# Patient Record
Sex: Male | Born: 1970 | Race: Black or African American | Hispanic: No | Marital: Married | Smoking: Never smoker
Health system: Southern US, Community
[De-identification: ages and names within clinical notes are randomized; demographics above are authoritative.]

## PROBLEM LIST (undated history)

## (undated) DIAGNOSIS — K429 Umbilical hernia without obstruction or gangrene: Secondary | ICD-10-CM

## (undated) DIAGNOSIS — T4145XA Adverse effect of unspecified anesthetic, initial encounter: Secondary | ICD-10-CM

## (undated) DIAGNOSIS — E782 Mixed hyperlipidemia: Secondary | ICD-10-CM

## (undated) DIAGNOSIS — N200 Calculus of kidney: Secondary | ICD-10-CM

## (undated) DIAGNOSIS — N400 Enlarged prostate without lower urinary tract symptoms: Secondary | ICD-10-CM

## (undated) DIAGNOSIS — K219 Gastro-esophageal reflux disease without esophagitis: Secondary | ICD-10-CM

## (undated) DIAGNOSIS — I2510 Atherosclerotic heart disease of native coronary artery without angina pectoris: Secondary | ICD-10-CM

## (undated) DIAGNOSIS — N529 Male erectile dysfunction, unspecified: Secondary | ICD-10-CM

## (undated) DIAGNOSIS — E109 Type 1 diabetes mellitus without complications: Secondary | ICD-10-CM

## (undated) DIAGNOSIS — Z9289 Personal history of other medical treatment: Secondary | ICD-10-CM

## (undated) DIAGNOSIS — I10 Essential (primary) hypertension: Secondary | ICD-10-CM

## (undated) DIAGNOSIS — Z973 Presence of spectacles and contact lenses: Secondary | ICD-10-CM

## (undated) DIAGNOSIS — M1990 Unspecified osteoarthritis, unspecified site: Secondary | ICD-10-CM

## (undated) DIAGNOSIS — K4090 Unilateral inguinal hernia, without obstruction or gangrene, not specified as recurrent: Secondary | ICD-10-CM

## (undated) DIAGNOSIS — E785 Hyperlipidemia, unspecified: Secondary | ICD-10-CM

## (undated) DIAGNOSIS — T8859XA Other complications of anesthesia, initial encounter: Secondary | ICD-10-CM

## (undated) DIAGNOSIS — I779 Disorder of arteries and arterioles, unspecified: Secondary | ICD-10-CM

## (undated) DIAGNOSIS — E119 Type 2 diabetes mellitus without complications: Secondary | ICD-10-CM

## (undated) DIAGNOSIS — H539 Unspecified visual disturbance: Secondary | ICD-10-CM

## (undated) DIAGNOSIS — Z87442 Personal history of urinary calculi: Secondary | ICD-10-CM

## (undated) DIAGNOSIS — J189 Pneumonia, unspecified organism: Secondary | ICD-10-CM

## (undated) HISTORY — DX: Personal history of other medical treatment: Z92.89

## (undated) HISTORY — PX: HERNIA REPAIR: SHX51

## (undated) HISTORY — DX: Atherosclerotic heart disease of native coronary artery without angina pectoris: I25.10

## (undated) HISTORY — DX: Unspecified visual disturbance: H53.9

## (undated) HISTORY — PX: KNEE SURGERY: SHX244

## (undated) HISTORY — DX: Disorder of arteries and arterioles, unspecified: I77.9

## (undated) HISTORY — DX: Type 1 diabetes mellitus without complications: E10.9

## (undated) NOTE — *Deleted (*Deleted)
Shared service with APP.  I have personally seen and examined the patient, providing direct face to face care.  Physical exam findings and plan include CT and MRI for signs of Occult stroke. Discussed with neurology for left arm numbness. Possible migraine related/ anxiety. MRI pending. No focal deficits.   No diagnosis found.

---
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
## 1989-06-08 HISTORY — PX: KNEE ARTHROSCOPY: SUR90

## 1998-09-16 ENCOUNTER — Encounter: Payer: Self-pay | Admitting: Emergency Medicine

## 1998-09-16 ENCOUNTER — Emergency Department (HOSPITAL_COMMUNITY): Admission: EM | Admit: 1998-09-16 | Discharge: 1998-09-16 | Payer: Self-pay | Admitting: Emergency Medicine

## 2000-02-08 ENCOUNTER — Emergency Department (HOSPITAL_COMMUNITY): Admission: EM | Admit: 2000-02-08 | Discharge: 2000-02-08 | Payer: Self-pay

## 2001-01-16 ENCOUNTER — Ambulatory Visit (HOSPITAL_COMMUNITY): Admission: RE | Admit: 2001-01-16 | Discharge: 2001-01-16 | Payer: Self-pay | Admitting: Orthopedic Surgery

## 2001-01-16 ENCOUNTER — Encounter: Payer: Self-pay | Admitting: Orthopedic Surgery

## 2004-11-07 ENCOUNTER — Inpatient Hospital Stay (HOSPITAL_COMMUNITY): Admission: EM | Admit: 2004-11-07 | Discharge: 2004-11-08 | Payer: Self-pay | Admitting: Family Medicine

## 2005-03-15 ENCOUNTER — Emergency Department (HOSPITAL_COMMUNITY): Admission: EM | Admit: 2005-03-15 | Discharge: 2005-03-15 | Payer: Self-pay | Admitting: Emergency Medicine

## 2005-05-17 ENCOUNTER — Ambulatory Visit (HOSPITAL_COMMUNITY): Admission: RE | Admit: 2005-05-17 | Discharge: 2005-05-17 | Payer: Self-pay | Admitting: Emergency Medicine

## 2007-10-13 ENCOUNTER — Emergency Department (HOSPITAL_COMMUNITY): Admission: EM | Admit: 2007-10-13 | Discharge: 2007-10-14 | Payer: Self-pay | Admitting: Emergency Medicine

## 2008-04-11 ENCOUNTER — Emergency Department (HOSPITAL_COMMUNITY): Admission: EM | Admit: 2008-04-11 | Discharge: 2008-04-12 | Payer: Self-pay | Admitting: Emergency Medicine

## 2008-07-28 ENCOUNTER — Emergency Department (HOSPITAL_COMMUNITY): Admission: EM | Admit: 2008-07-28 | Discharge: 2008-07-28 | Payer: Self-pay | Admitting: Emergency Medicine

## 2008-07-30 ENCOUNTER — Emergency Department (HOSPITAL_COMMUNITY): Admission: EM | Admit: 2008-07-30 | Discharge: 2008-07-30 | Payer: Self-pay | Admitting: Emergency Medicine

## 2009-05-14 ENCOUNTER — Emergency Department (HOSPITAL_COMMUNITY): Admission: EM | Admit: 2009-05-14 | Discharge: 2009-05-14 | Payer: Self-pay | Admitting: Emergency Medicine

## 2009-05-28 ENCOUNTER — Emergency Department (HOSPITAL_COMMUNITY): Admission: EM | Admit: 2009-05-28 | Discharge: 2009-05-28 | Payer: Self-pay | Admitting: Emergency Medicine

## 2009-06-27 ENCOUNTER — Observation Stay (HOSPITAL_COMMUNITY): Admission: EM | Admit: 2009-06-27 | Discharge: 2009-06-28 | Payer: Self-pay | Admitting: Emergency Medicine

## 2009-07-23 ENCOUNTER — Emergency Department (HOSPITAL_COMMUNITY): Admission: EM | Admit: 2009-07-23 | Discharge: 2009-07-23 | Payer: Self-pay | Admitting: Emergency Medicine

## 2009-08-16 ENCOUNTER — Emergency Department (HOSPITAL_COMMUNITY): Admission: EM | Admit: 2009-08-16 | Discharge: 2009-08-16 | Payer: Self-pay | Admitting: Emergency Medicine

## 2010-02-24 ENCOUNTER — Encounter (INDEPENDENT_AMBULATORY_CARE_PROVIDER_SITE_OTHER): Payer: Self-pay | Admitting: Internal Medicine

## 2010-03-04 ENCOUNTER — Emergency Department (HOSPITAL_COMMUNITY): Admission: EM | Admit: 2010-03-04 | Discharge: 2010-03-04 | Payer: Self-pay | Admitting: Emergency Medicine

## 2010-05-15 ENCOUNTER — Inpatient Hospital Stay (HOSPITAL_COMMUNITY): Admission: EM | Admit: 2010-05-15 | Discharge: 2010-02-24 | Payer: Self-pay | Admitting: Emergency Medicine

## 2010-06-16 ENCOUNTER — Encounter
Admission: RE | Admit: 2010-06-16 | Discharge: 2010-06-16 | Payer: Self-pay | Source: Home / Self Care | Attending: Family Medicine | Admitting: Family Medicine

## 2010-06-26 ENCOUNTER — Emergency Department (HOSPITAL_COMMUNITY)
Admission: EM | Admit: 2010-06-26 | Discharge: 2010-06-26 | Payer: Self-pay | Source: Home / Self Care | Admitting: Emergency Medicine

## 2010-06-30 LAB — URINALYSIS, ROUTINE W REFLEX MICROSCOPIC
Bilirubin Urine: NEGATIVE
Hgb urine dipstick: NEGATIVE
Ketones, ur: NEGATIVE mg/dL
Nitrite: NEGATIVE
Protein, ur: NEGATIVE mg/dL
Specific Gravity, Urine: 1.018 (ref 1.005–1.030)
Urine Glucose, Fasting: NEGATIVE mg/dL
Urobilinogen, UA: 0.2 mg/dL (ref 0.0–1.0)
pH: 8 (ref 5.0–8.0)

## 2010-08-13 ENCOUNTER — Emergency Department (HOSPITAL_COMMUNITY)
Admission: EM | Admit: 2010-08-13 | Discharge: 2010-08-14 | Disposition: A | Discharge status: Home / Self Care | Payer: BC Managed Care – PPO | Attending: Emergency Medicine | Admitting: Emergency Medicine

## 2010-08-13 ENCOUNTER — Emergency Department (HOSPITAL_COMMUNITY): Payer: BC Managed Care – PPO

## 2010-08-13 DIAGNOSIS — R071 Chest pain on breathing: Secondary | ICD-10-CM | POA: Insufficient documentation

## 2010-08-13 LAB — DIFFERENTIAL
Basophils Absolute: 0 10*3/uL (ref 0.0–0.1)
Basophils Relative: 1 % (ref 0–1)
Eosinophils Absolute: 0.3 10*3/uL (ref 0.0–0.7)
Eosinophils Relative: 4 % (ref 0–5)
Lymphocytes Relative: 39 % (ref 12–46)
Lymphs Abs: 3 10*3/uL (ref 0.7–4.0)
Monocytes Absolute: 0.6 10*3/uL (ref 0.1–1.0)
Monocytes Relative: 8 % (ref 3–12)
Neutro Abs: 3.8 10*3/uL (ref 1.7–7.7)
Neutrophils Relative %: 49 % (ref 43–77)

## 2010-08-13 LAB — POCT CARDIAC MARKERS
CKMB, poc: <1 ng/mL — ABNORMAL LOW (ref 1.0–8.0)
Myoglobin, poc: 58.8 ng/mL (ref 12–200)
Troponin i, poc: <0.05 ng/mL (ref 0.00–0.09)

## 2010-08-13 LAB — CBC
HCT: 39.5 % (ref 39.0–52.0)
Hemoglobin: 13 g/dL (ref 13.0–17.0)
MCH: 27.3 pg (ref 26.0–34.0)
MCHC: 32.9 g/dL (ref 30.0–36.0)
MCV: 82.8 fL (ref 78.0–100.0)
Platelets: 216 10*3/uL (ref 150–400)
RBC: 4.77 MIL/uL (ref 4.22–5.81)
RDW: 12.7 % (ref 11.5–15.5)
WBC: 7.7 10*3/uL (ref 4.0–10.5)

## 2010-08-14 LAB — BASIC METABOLIC PANEL
BUN: 11 mg/dL (ref 6–23)
CO2: 25 mEq/L (ref 19–32)
Calcium: 8.4 mg/dL (ref 8.4–10.5)
Chloride: 106 mEq/L (ref 96–112)
Creatinine, Ser: 0.89 mg/dL (ref 0.4–1.5)
GFR calc Af Amer: >60 mL/min (ref >60)
GFR calc non Af Amer: >60 mL/min (ref >60)
Glucose, Bld: 104 mg/dL — ABNORMAL HIGH (ref 70–99)
Potassium: 3.6 mEq/L (ref 3.5–5.1)
Sodium: 137 mEq/L (ref 135–145)

## 2010-08-21 LAB — BASIC METABOLIC PANEL
BUN: 10 mg/dL (ref 6–23)
BUN: 13 mg/dL (ref 6–23)
CO2: 27 mEq/L (ref 19–32)
CO2: 28 mEq/L (ref 19–32)
Calcium: 8.3 mg/dL — ABNORMAL LOW (ref 8.4–10.5)
Calcium: 8.5 mg/dL (ref 8.4–10.5)
Chloride: 109 mEq/L (ref 96–112)
Chloride: 110 mEq/L (ref 96–112)
Creatinine, Ser: 0.91 mg/dL (ref 0.4–1.5)
Creatinine, Ser: 0.98 mg/dL (ref 0.4–1.5)
GFR calc Af Amer: >60 mL/min (ref >60)
GFR calc Af Amer: >60 mL/min (ref >60)
GFR calc non Af Amer: >60 mL/min (ref >60)
GFR calc non Af Amer: >60 mL/min (ref >60)
Glucose, Bld: 111 mg/dL — ABNORMAL HIGH (ref 70–99)
Glucose, Bld: 85 mg/dL (ref 70–99)
Potassium: 3.5 mEq/L (ref 3.5–5.1)
Potassium: 3.6 mEq/L (ref 3.5–5.1)
Sodium: 140 mEq/L (ref 135–145)
Sodium: 142 mEq/L (ref 135–145)

## 2010-08-21 LAB — CBC
HCT: 37.7 % — ABNORMAL LOW (ref 39.0–52.0)
HCT: 39.1 % (ref 39.0–52.0)
Hemoglobin: 12.5 g/dL — ABNORMAL LOW (ref 13.0–17.0)
Hemoglobin: 13 g/dL (ref 13.0–17.0)
MCH: 28.1 pg (ref 26.0–34.0)
MCH: 28.2 pg (ref 26.0–34.0)
MCHC: 33.2 g/dL (ref 30.0–36.0)
MCHC: 33.2 g/dL (ref 30.0–36.0)
MCV: 84.6 fL (ref 78.0–100.0)
MCV: 84.9 fL (ref 78.0–100.0)
Platelets: 170 10*3/uL (ref 150–400)
Platelets: 171 10*3/uL (ref 150–400)
RBC: 4.43 MIL/uL (ref 4.22–5.81)
RBC: 4.62 MIL/uL (ref 4.22–5.81)
RDW: 13.6 % (ref 11.5–15.5)
RDW: 13.6 % (ref 11.5–15.5)
WBC: 6.1 10*3/uL (ref 4.0–10.5)
WBC: 6.4 10*3/uL (ref 4.0–10.5)

## 2010-08-21 LAB — TROPONIN I: Troponin I: 0.01 ng/mL (ref 0.00–0.06)

## 2010-08-21 LAB — LIPID PANEL
Cholesterol: 169 mg/dL (ref 0–200)
HDL: 54 mg/dL (ref >39)
LDL Cholesterol: 100 mg/dL — ABNORMAL HIGH (ref 0–99)
Total CHOL/HDL Ratio: 3.1 RATIO
Triglycerides: 74 mg/dL (ref <150)
VLDL: 15 mg/dL (ref 0–40)

## 2010-08-21 LAB — DIFFERENTIAL
Basophils Absolute: 0 10*3/uL (ref 0.0–0.1)
Basophils Absolute: 0 10*3/uL (ref 0.0–0.1)
Basophils Relative: 1 % (ref 0–1)
Basophils Relative: 1 % (ref 0–1)
Eosinophils Absolute: 0.2 10*3/uL (ref 0.0–0.7)
Eosinophils Absolute: 0.2 10*3/uL (ref 0.0–0.7)
Eosinophils Relative: 3 % (ref 0–5)
Eosinophils Relative: 3 % (ref 0–5)
Lymphocytes Relative: 38 % (ref 12–46)
Lymphocytes Relative: 41 % (ref 12–46)
Lymphs Abs: 2.4 10*3/uL (ref 0.7–4.0)
Lymphs Abs: 2.5 10*3/uL (ref 0.7–4.0)
Monocytes Absolute: 0.6 10*3/uL (ref 0.1–1.0)
Monocytes Absolute: 0.6 10*3/uL (ref 0.1–1.0)
Monocytes Relative: 10 % (ref 3–12)
Monocytes Relative: 9 % (ref 3–12)
Neutro Abs: 2.8 10*3/uL (ref 1.7–7.7)
Neutro Abs: 3.1 10*3/uL (ref 1.7–7.7)
Neutrophils Relative %: 46 % (ref 43–77)
Neutrophils Relative %: 49 % (ref 43–77)

## 2010-08-21 LAB — URINALYSIS, ROUTINE W REFLEX MICROSCOPIC
Bilirubin Urine: NEGATIVE
Glucose, UA: NEGATIVE mg/dL
Hgb urine dipstick: NEGATIVE
Ketones, ur: NEGATIVE mg/dL
Nitrite: NEGATIVE
Protein, ur: NEGATIVE mg/dL
Specific Gravity, Urine: 1.022 (ref 1.005–1.030)
Urobilinogen, UA: 0.2 mg/dL (ref 0.0–1.0)
pH: 6.5 (ref 5.0–8.0)

## 2010-08-21 LAB — TSH: TSH: 2.188 u[IU]/mL (ref 0.350–4.500)

## 2010-08-21 LAB — COMPREHENSIVE METABOLIC PANEL
ALT: 13 U/L (ref 0–53)
AST: 18 U/L (ref 0–37)
Albumin: 3.7 g/dL (ref 3.5–5.2)
Alkaline Phosphatase: 44 U/L (ref 39–117)
BUN: 14 mg/dL (ref 6–23)
CO2: 29 mEq/L (ref 19–32)
Calcium: 9 mg/dL (ref 8.4–10.5)
Chloride: 109 mEq/L (ref 96–112)
Creatinine, Ser: 0.99 mg/dL (ref 0.4–1.5)
GFR calc Af Amer: >60 mL/min (ref >60)
GFR calc non Af Amer: >60 mL/min (ref >60)
Glucose, Bld: 96 mg/dL (ref 70–99)
Potassium: 3.9 mEq/L (ref 3.5–5.1)
Sodium: 141 mEq/L (ref 135–145)
Total Bilirubin: 0.3 mg/dL (ref 0.3–1.2)
Total Protein: 6.4 g/dL (ref 6.0–8.3)

## 2010-08-21 LAB — CK TOTAL AND CKMB (NOT AT ARMC)
CK, MB: 2.6 ng/mL (ref 0.3–4.0)
Relative Index: 0.6 (ref 0.0–2.5)
Total CK: 405 U/L — ABNORMAL HIGH (ref 7–232)

## 2010-08-21 LAB — RAPID URINE DRUG SCREEN, HOSP PERFORMED
Amphetamines: NOT DETECTED
Barbiturates: NOT DETECTED
Benzodiazepines: NOT DETECTED
Cocaine: NOT DETECTED
Opiates: NOT DETECTED
Tetrahydrocannabinol: NOT DETECTED

## 2010-08-21 LAB — CARDIAC PANEL(CRET KIN+CKTOT+MB+TROPI)
CK, MB: 2.4 ng/mL (ref 0.3–4.0)
CK, MB: 2.4 ng/mL (ref 0.3–4.0)
Relative Index: 0.7 (ref 0.0–2.5)
Relative Index: 0.7 (ref 0.0–2.5)
Total CK: 341 U/L — ABNORMAL HIGH (ref 7–232)
Total CK: 343 U/L — ABNORMAL HIGH (ref 7–232)
Troponin I: <0.01 ng/mL (ref 0.00–0.06)
Troponin I: <0.01 ng/mL (ref 0.00–0.06)

## 2010-08-21 LAB — CORTISOL-AM, BLOOD: Cortisol - AM: 12.4 ug/dL (ref 4.3–22.4)

## 2010-08-24 LAB — URINALYSIS, ROUTINE W REFLEX MICROSCOPIC
Bilirubin Urine: NEGATIVE
Glucose, UA: NEGATIVE mg/dL
Hgb urine dipstick: NEGATIVE
Ketones, ur: NEGATIVE mg/dL
Nitrite: NEGATIVE
Protein, ur: NEGATIVE mg/dL
Specific Gravity, Urine: 1.025 (ref 1.005–1.030)
Urobilinogen, UA: 0.2 mg/dL (ref 0.0–1.0)
pH: 7.5 (ref 5.0–8.0)

## 2010-08-24 LAB — POCT I-STAT, CHEM 8
BUN: 11 mg/dL (ref 6–23)
Calcium, Ion: 1.17 mmol/L (ref 1.12–1.32)
Chloride: 107 meq/L (ref 96–112)
Creatinine, Ser: 0.9 mg/dL (ref 0.4–1.5)
Glucose, Bld: 109 mg/dL — ABNORMAL HIGH (ref 70–99)
HCT: 41 % (ref 39.0–52.0)
Hemoglobin: 13.9 g/dL (ref 13.0–17.0)
Potassium: 4 meq/L (ref 3.5–5.1)
Sodium: 142 meq/L (ref 135–145)
TCO2: 28 mmol/L (ref 0–100)

## 2010-08-31 LAB — URINALYSIS, ROUTINE W REFLEX MICROSCOPIC
Bilirubin Urine: NEGATIVE
Glucose, UA: NEGATIVE mg/dL
Hgb urine dipstick: NEGATIVE
Ketones, ur: NEGATIVE mg/dL
Nitrite: NEGATIVE
Protein, ur: NEGATIVE mg/dL
Specific Gravity, Urine: 1.018 (ref 1.005–1.030)
Urobilinogen, UA: 0.2 mg/dL (ref 0.0–1.0)
pH: 8.5 — ABNORMAL HIGH (ref 5.0–8.0)

## 2010-08-31 LAB — GC/CHLAMYDIA PROBE AMP, GENITAL
Chlamydia, DNA Probe: NEGATIVE
GC Probe Amp, Genital: NEGATIVE

## 2010-09-08 LAB — BASIC METABOLIC PANEL
BUN: 8 mg/dL (ref 6–23)
CO2: 24 mEq/L (ref 19–32)
Calcium: 8.8 mg/dL (ref 8.4–10.5)
Chloride: 106 mEq/L (ref 96–112)
Creatinine, Ser: 0.9 mg/dL (ref 0.4–1.5)
GFR calc Af Amer: >60 mL/min (ref >60)
GFR calc non Af Amer: >60 mL/min (ref >60)
Glucose, Bld: 96 mg/dL (ref 70–99)
Potassium: 4.1 mEq/L (ref 3.5–5.1)
Sodium: 139 mEq/L (ref 135–145)

## 2010-09-08 LAB — DIFFERENTIAL
Basophils Absolute: 0 10*3/uL (ref 0.0–0.1)
Basophils Relative: 0 % (ref 0–1)
Eosinophils Absolute: 0.2 10*3/uL (ref 0.0–0.7)
Eosinophils Relative: 3 % (ref 0–5)
Lymphocytes Relative: 21 % (ref 12–46)
Lymphs Abs: 1.6 10*3/uL (ref 0.7–4.0)
Monocytes Absolute: 0.7 10*3/uL (ref 0.1–1.0)
Monocytes Relative: 9 % (ref 3–12)
Neutro Abs: 5.2 10*3/uL (ref 1.7–7.7)
Neutrophils Relative %: 67 % (ref 43–77)

## 2010-09-08 LAB — CBC
HCT: 40.7 % (ref 39.0–52.0)
Hemoglobin: 13.4 g/dL (ref 13.0–17.0)
MCHC: 33 g/dL (ref 30.0–36.0)
MCV: 85.4 fL (ref 78.0–100.0)
Platelets: 201 10*3/uL (ref 150–400)
RBC: 4.77 MIL/uL (ref 4.22–5.81)
RDW: 13.4 % (ref 11.5–15.5)
WBC: 7.8 10*3/uL (ref 4.0–10.5)

## 2010-09-09 LAB — URINALYSIS, ROUTINE W REFLEX MICROSCOPIC
Bilirubin Urine: NEGATIVE
Glucose, UA: NEGATIVE mg/dL
Hgb urine dipstick: NEGATIVE
Ketones, ur: NEGATIVE mg/dL
Nitrite: NEGATIVE
Protein, ur: NEGATIVE mg/dL
Specific Gravity, Urine: 1.024 (ref 1.005–1.030)
Urobilinogen, UA: 0.2 mg/dL (ref 0.0–1.0)
pH: 6.5 (ref 5.0–8.0)
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
## 2010-11-14 ENCOUNTER — Emergency Department (HOSPITAL_COMMUNITY): Payer: BC Managed Care – PPO

## 2010-11-14 ENCOUNTER — Emergency Department (HOSPITAL_COMMUNITY)
Admission: EM | Admit: 2010-11-14 | Discharge: 2010-11-15 | Disposition: A | Discharge status: Home / Self Care | Payer: BC Managed Care – PPO | Attending: Emergency Medicine | Admitting: Emergency Medicine

## 2010-11-14 DIAGNOSIS — R51 Headache: Secondary | ICD-10-CM | POA: Insufficient documentation

## 2010-11-15 LAB — POCT I-STAT, CHEM 8
BUN: 14 mg/dL (ref 6–23)
Calcium, Ion: 1.2 mmol/L (ref 1.12–1.32)
Chloride: 103 mEq/L (ref 96–112)
Creatinine, Ser: 1.1 mg/dL (ref 0.4–1.5)
Glucose, Bld: 129 mg/dL — ABNORMAL HIGH (ref 70–99)
HCT: 40 % (ref 39.0–52.0)
Hemoglobin: 13.6 g/dL (ref 13.0–17.0)
Potassium: 3.5 mEq/L (ref 3.5–5.1)
Sodium: 142 mEq/L (ref 135–145)
TCO2: 24 mmol/L (ref 0–100)

## 2011-02-05 ENCOUNTER — Emergency Department (HOSPITAL_COMMUNITY): Payer: BC Managed Care – PPO

## 2011-02-05 ENCOUNTER — Emergency Department (HOSPITAL_COMMUNITY)
Admission: EM | Admit: 2011-02-05 | Discharge: 2011-02-06 | Disposition: A | Discharge status: Home / Self Care | Payer: BC Managed Care – PPO | Attending: Emergency Medicine | Admitting: Emergency Medicine

## 2011-02-05 DIAGNOSIS — M5430 Sciatica, unspecified side: Secondary | ICD-10-CM | POA: Insufficient documentation

## 2011-02-05 DIAGNOSIS — M545 Low back pain, unspecified: Secondary | ICD-10-CM | POA: Insufficient documentation

## 2011-02-05 DIAGNOSIS — M51379 Other intervertebral disc degeneration, lumbosacral region without mention of lumbar back pain or lower extremity pain: Secondary | ICD-10-CM | POA: Insufficient documentation

## 2011-02-05 DIAGNOSIS — M5137 Other intervertebral disc degeneration, lumbosacral region: Secondary | ICD-10-CM | POA: Insufficient documentation

## 2011-02-05 DIAGNOSIS — M25559 Pain in unspecified hip: Secondary | ICD-10-CM | POA: Insufficient documentation

## 2011-02-05 LAB — URINALYSIS, ROUTINE W REFLEX MICROSCOPIC
Bilirubin Urine: NEGATIVE
Glucose, UA: NEGATIVE mg/dL
Hgb urine dipstick: NEGATIVE
Ketones, ur: NEGATIVE mg/dL
Leukocytes, UA: NEGATIVE
Nitrite: NEGATIVE
Protein, ur: NEGATIVE mg/dL
Specific Gravity, Urine: 1.02 (ref 1.005–1.030)
Urobilinogen, UA: 0.2 mg/dL (ref 0.0–1.0)
pH: 6.5 (ref 5.0–8.0)

## 2011-03-10 LAB — COMPREHENSIVE METABOLIC PANEL
ALT: 22
AST: 25
Albumin: 3.8
Alkaline Phosphatase: 49
BUN: 15
CO2: 27
Calcium: 9
Chloride: 107
Creatinine, Ser: 1.1
GFR calc Af Amer: >60
GFR calc non Af Amer: >60
Glucose, Bld: 97
Potassium: 3.9
Sodium: 139
Total Bilirubin: 0.6
Total Protein: 6.3

## 2011-03-10 LAB — URINALYSIS, ROUTINE W REFLEX MICROSCOPIC
Bilirubin Urine: NEGATIVE
Glucose, UA: NEGATIVE
Hgb urine dipstick: NEGATIVE
Ketones, ur: NEGATIVE
Nitrite: NEGATIVE
Protein, ur: NEGATIVE
Specific Gravity, Urine: 1.021
Urobilinogen, UA: 0.2
pH: 6

## 2011-03-10 LAB — DIFFERENTIAL
Basophils Absolute: 0
Basophils Relative: 1
Eosinophils Absolute: 0.2
Eosinophils Relative: 2
Lymphocytes Relative: 35
Lymphs Abs: 2.7
Monocytes Absolute: 0.6
Monocytes Relative: 7
Neutro Abs: 4.3
Neutrophils Relative %: 55

## 2011-03-10 LAB — CBC
HCT: 42.1
Hemoglobin: 13.6
MCHC: 32.4
MCV: 84.8
Platelets: 211
RBC: 4.97
RDW: 13.7
WBC: 7.8

## 2011-03-10 LAB — LIPASE, BLOOD: Lipase: 14

## 2011-07-31 ENCOUNTER — Emergency Department (HOSPITAL_COMMUNITY)
Admission: EM | Admit: 2011-07-31 | Discharge: 2011-07-31 | Disposition: A | Discharge status: Home / Self Care | Payer: BC Managed Care – PPO | Attending: Emergency Medicine | Admitting: Emergency Medicine

## 2011-07-31 ENCOUNTER — Other Ambulatory Visit: Payer: Self-pay

## 2011-07-31 ENCOUNTER — Encounter (HOSPITAL_COMMUNITY): Payer: Self-pay

## 2011-07-31 ENCOUNTER — Emergency Department (HOSPITAL_COMMUNITY): Payer: BC Managed Care – PPO

## 2011-07-31 DIAGNOSIS — R109 Unspecified abdominal pain: Secondary | ICD-10-CM | POA: Insufficient documentation

## 2011-07-31 DIAGNOSIS — R0602 Shortness of breath: Secondary | ICD-10-CM | POA: Insufficient documentation

## 2011-07-31 HISTORY — DX: Calculus of kidney: N20.0

## 2011-07-31 HISTORY — DX: Gastro-esophageal reflux disease without esophagitis: K21.9

## 2011-07-31 LAB — BASIC METABOLIC PANEL
BUN: 16 mg/dL (ref 6–23)
CO2: 26 mEq/L (ref 19–32)
Calcium: 9.4 mg/dL (ref 8.4–10.5)
Chloride: 107 mEq/L (ref 96–112)
Creatinine, Ser: 1.08 mg/dL (ref 0.50–1.35)
GFR calc Af Amer: >90 mL/min (ref >90)
GFR calc non Af Amer: 83 mL/min — ABNORMAL LOW (ref >90)
Glucose, Bld: 111 mg/dL — ABNORMAL HIGH (ref 70–99)
Potassium: 4.2 mEq/L (ref 3.5–5.1)
Sodium: 142 mEq/L (ref 135–145)

## 2011-07-31 LAB — CBC
HCT: 41.1 % (ref 39.0–52.0)
Hemoglobin: 13.7 g/dL (ref 13.0–17.0)
MCH: 27.9 pg (ref 26.0–34.0)
MCHC: 33.3 g/dL (ref 30.0–36.0)
MCV: 83.7 fL (ref 78.0–100.0)
Platelets: 198 10*3/uL (ref 150–400)
RBC: 4.91 MIL/uL (ref 4.22–5.81)
RDW: 12.8 % (ref 11.5–15.5)
WBC: 6.5 10*3/uL (ref 4.0–10.5)

## 2011-07-31 LAB — URINALYSIS, ROUTINE W REFLEX MICROSCOPIC
Bilirubin Urine: NEGATIVE
Glucose, UA: NEGATIVE mg/dL
Hgb urine dipstick: NEGATIVE
Ketones, ur: NEGATIVE mg/dL
Leukocytes, UA: NEGATIVE
Nitrite: NEGATIVE
Protein, ur: NEGATIVE mg/dL
Specific Gravity, Urine: 1.025 (ref 1.005–1.030)
Urobilinogen, UA: 1 mg/dL (ref 0.0–1.0)
pH: 7.5 (ref 5.0–8.0)

## 2011-07-31 LAB — TROPONIN I: Troponin I: <0.3 ng/mL (ref <0.30)

## 2011-07-31 MED ORDER — ONDANSETRON HCL 4 MG/2ML IJ SOLN
4.0000 mg | Freq: Once | INTRAMUSCULAR | Status: AC
  Administered 2011-07-31: 4 mg via INTRAVENOUS
  Filled 2011-07-31: qty 2

## 2011-07-31 MED ORDER — HYDROMORPHONE HCL PF 1 MG/ML IJ SOLN
1.0000 mg | Freq: Once | INTRAMUSCULAR | Status: DC
  Filled 2011-07-31: qty 1

## 2011-07-31 MED ORDER — MORPHINE SULFATE 4 MG/ML IJ SOLN
4.0000 mg | Freq: Once | INTRAMUSCULAR | Status: AC
  Administered 2011-07-31: 4 mg via INTRAVENOUS
  Filled 2011-07-31: qty 1

## 2011-07-31 MED ORDER — KETOROLAC TROMETHAMINE 30 MG/ML IJ SOLN
15.0000 mg | Freq: Once | INTRAMUSCULAR | Status: AC
  Administered 2011-07-31: 15 mg via INTRAVENOUS
  Filled 2011-07-31: qty 1

## 2011-07-31 MED ORDER — MORPHINE SULFATE 4 MG/ML IJ SOLN
6.0000 mg | Freq: Once | INTRAMUSCULAR | Status: AC
  Administered 2011-07-31: 6 mg via INTRAVENOUS
  Filled 2011-07-31: qty 2

## 2011-07-31 MED ORDER — NAPROXEN 500 MG PO TABS: 500.0000 mg | ORAL_TABLET | Freq: Two times a day (BID) | ORAL | Status: DC

## 2011-07-31 MED ORDER — SODIUM CHLORIDE 0.9 % IV BOLUS (SEPSIS)
1000.0000 mL | Freq: Once | INTRAVENOUS | Status: AC
  Administered 2011-07-31: 1000 mL via INTRAVENOUS

## 2011-07-31 MED ORDER — OXYCODONE-ACETAMINOPHEN 5-325 MG PO TABS: 1.0000 | ORAL_TABLET | ORAL | Status: AC | PRN

## 2011-07-31 NOTE — ED Notes (Signed)
Patient being transported to xray and then CT before returning to unit.

## 2011-07-31 NOTE — ED Notes (Signed)
Called patient's wife at patient's request 

## 2011-07-31 NOTE — ED Notes (Signed)
Patient remains on monitor and oxygen with sats 98% RA. Patient remains in pain and family at the bedside.

## 2011-07-31 NOTE — Discharge Instructions (Signed)
Abdominal Pain Abdominal pain can be caused by many things. Your caregiver decides the seriousness of your pain by an examination and possibly blood tests and X-rays. Many cases can be observed and treated at home. Most abdominal pain is not caused by a disease and will probably improve without treatment. However, in many cases, more time must pass before a clear cause of the pain can be found. Before that point, it may not be known if you need more testing, or if hospitalization or surgery is needed. HOME CARE INSTRUCTIONS   Do not take laxatives unless directed by your caregiver.   Take pain medicine only as directed by your caregiver.   Only take over-the-counter or prescription medicines for pain, discomfort, or fever as directed by your caregiver.   Try a clear liquid diet (broth, tea, or water) for as long as directed by your caregiver. Slowly move to a bland diet as tolerated.  SEEK IMMEDIATE MEDICAL CARE IF:   The pain does not go away.   You have a fever.   You keep throwing up (vomiting).   The pain is felt only in portions of the abdomen. Pain in the right side could possibly be appendicitis. In an adult, pain in the left lower portion of the abdomen could be colitis or diverticulitis.   You pass bloody or black tarry stools.  MAKE SURE YOU:   Understand these instructions.   Will watch your condition.   Will get help right away if you are not doing well or get worse.  Document Released: 03/04/2005 Document Revised: 02/04/2011 Document Reviewed: 01/11/2008 [facility] Patient Information 2012 [place], [place].Flank Pain Flank pain is pain in your side.  HOME CARE  Home care and treatment will depend on the cause of your pain.   Some medicines can help relieve the pain. Take all medicine as told by your doctor.   Tell your doctor about any changes in your pain.   Follow up with your doctor.  GET HELP RIGHT AWAY IF:   Your pain does not get better with medicine.    The pain gets worse.   You have belly (abdominal) pain.   You are short of breath.   You always feel sick to your stomach (nauseous).   You keep throwing up (vomiting).   You have puffiness (swelling) in your belly.   You pass out (faint).   You have a temperature by mouth above 102 F (38.9 C), not controlled by medicine.  MAKE SURE YOU:   Understand these instructions.   Will watch your condition.   Will get help right away if you are not doing well or get worse.  Document Released: 03/03/2008 Document Revised: 02/04/2011 Document Reviewed: 11/09/2009 [facility] Patient Information 2012 [place], [place].  RESOURCE GUIDE  Dental Problems  Patients with Medicaid: [facility] [phone]                                           [phone] [address] [employer] Phone:  [phone]  Phone:  [phone]  If unable to pay or uninsured, contact:  [facility] or [facility]. to become qualified for the adult dental clinic.  Chronic Pain Problems Contact [facility]  [phone] Patients need to be referred by their primary care doctor.  Insufficient Money for Medicine Contact [employer] Way:  call "211" or [facility] [phone].  No Primary Care Doctor Call [facility]  [phone] Other agencies that provide inexpensive medical care    [facility]  [phone]    [facility]  [phone]    [facility]  [phone]    [facility]  [phone]    [facility]  [phone]    [facility]  [phone]  Psychological Services [facility]  [phone] [facility]  [phone] [facility]   [phone] (emergency services [phone])  Substance Abuse Resources [facility]  [phone] [facility] [phone] [facility]  [phone] Floydene Flock [phone] Residential & Outpatient Substance Abuse Program  650-847-4053  Abuse/Neglect [facility] Child Abuse Hotline [phone] [facility] Child Abuse Hotline 336-419-9938 (After Hours)  Emergency Shelter [facility] (469) [phone]  Maternity Homes [facility] [place] [facility] 516-010-6766 [facility] [phone]  MRSA Hotline #:   9494469650    [facility] Resources  [facility]     [employer]                          [facility] Dept. [address] [place]                       [address]      [address] [place] [address]  Blondell Reveal Phone:  [phone]                                   Phone:  [phone]                 Phone:  [phone]  [facility] Phone:  [phone]  [facility] Child Abuse Hotline 914-506-6294 [phone] (After Hours)

## 2011-07-31 NOTE — ED Notes (Signed)
Ekg done and old ekg found in muse and given to Dr. Rubin Payor.

## 2011-07-31 NOTE — ED Notes (Signed)
Patient states his left side and he feels dizzy. Patient states the pain is intermittent and once pain subsides patient states he feels nervious. Patient denies vomiting. Patient states he has nausea with the on set of pain and feels pressure in his chest.

## 2011-07-31 NOTE — ED Provider Notes (Addendum)
History    41 year old male with left flank pain. Acute onset while he is eating lunch this afternoon. Describes a sharp pain in his left side and radiates into lower axillary region. Has been intermittent since then. Last seconds to up to a minute. No appreciable exacerbating or relieving factors. Mild nausea and dizziness. No vomiting. No urinary complaints. No diarrhea. Denies history of similar symptoms. No shortness of breath. No unusual leg pain or swelling. Denies history of abdominal surgery.  CSN: [id number]  Arrival date & time 07/31/11  1325   First MD Initiated Contact with Patient 07/31/11 1344      Chief Complaint  Patient presents with  . Chest Pain    (Consider location/radiation/quality/duration/timing/severity/associated sxs/prior treatment) HPI  Past Medical History  Diagnosis Date  . GERD (gastroesophageal reflux disease)     History reviewed. No pertinent past surgical history.  History reviewed. No pertinent family history.  History  Substance Use Topics  . Smoking status: Never Smoker   . Smokeless tobacco: Not on file  . Alcohol Use: No      Review of Systems   Review of symptoms negative unless otherwise noted in HPI.   Allergies  Review of patient's allergies indicates no known allergies.  Home Medications  No current outpatient prescriptions on file.  BP 125/76  Pulse 85  Temp(Src) 98.2 F (36.8 C) (Oral)  Resp 16  Ht 5\' 9"  (1.753 m)  Wt 170 lb (77.111 kg)  BMI 25.10 kg/m2  SpO2 99%  Physical Exam  Nursing note and vitals reviewed. Constitutional: He appears well-developed and well-nourished. No distress.  HENT:  Head: Normocephalic and atraumatic.  Eyes: Conjunctivae are normal. Pupils are equal, round, and reactive to light. Right eye exhibits no discharge. Left eye exhibits no discharge.  Neck: Neck supple.  Cardiovascular: Normal rate, regular rhythm and normal heart sounds.  Exam reveals no gallop and no friction rub.     No murmur heard. Pulmonary/Chest: Effort normal and breath sounds normal. No respiratory distress.  Abdominal: Soft. He exhibits no distension. There is tenderness.       Mild tenderness to left lower quadrant. There is no guarding or rebound. There is no distention. No surgical scars noted.  Genitourinary:       Normal external male genitalia. Circumcised. Testicles were normal Iie. No scrotal edema. No testicular tenderness. Is no inguinal adenopathy. No concerning external lesions. No penile discharge. Tenderness L inguinal canal but no bulge palpated.  Musculoskeletal: Normal range of motion. He exhibits no edema and no tenderness.  Neurological: He is alert.  Skin: Skin is warm and dry. He is not diaphoretic.  Psychiatric: He has a normal mood and affect. His behavior is normal. Thought content normal.    ED Course  Procedures (including critical care time)  Labs Reviewed  URINALYSIS, ROUTINE W REFLEX MICROSCOPIC - Abnormal; Notable for the following:    APPearance CLOUDY (*)    All other components within normal limits  BASIC METABOLIC PANEL - Abnormal; Notable for the following:    Glucose, Bld 111 (*)    GFR calc non Af Amer 83 (*)    All other components within normal limits  TROPONIN I  CBC   Ct Abdomen Pelvis Wo Contrast  07/31/2011  *RADIOLOGY REPORT*  Clinical Data: Left-sided pain.  Nausea.  CT ABDOMEN AND PELVIS WITHOUT CONTRAST  Technique:  Multidetector CT imaging of the abdomen and pelvis was performed following the standard protocol without intravenous contrast.  Comparison: 06/16/2010.  Findings: 3 mm left lower pole nonobstructing renal calculi.  No evidence of hydronephrosis.  Unenhanced imaging without focal liver, splenic, adrenal or pancreatic lesion.  No calcified gallstones.  Questionable tiny calcification in the right coronary artery.  No calcifications seen along the course of the aorta or branch vessels.  Degenerative changes lower lumbar spine without bony  destructive lesion.  No bowel inflammatory process, free fluid or free air noted.  Prominent seminal vesicles once again noted.  Decompressed urinary bladder without gross abnormality.  IMPRESSION: 3 mm left lower pole nonobstructing renal calculus.  Please see above.  Original Report Authenticated By: Fuller Canada, M.D.    EKG:  Rhythm: Normal sinus rhythm Rate: 89 Axis: Normal axis Intervals: Normal ST segments: Normal  4:40 PM Pt complaining of pain and says can't go home. Requesting additional pain medication, specifically demerol. Explained to pt that I rarely order this because of alternatives with better safety profiles. Offered dilaudid but claims allergy. Morphine re-ordered. Wife driving home. Medically cleared at this time.  4:59 PM Pt remains upset because still having pain. Explained that may not be able to make him pain free prior to DC and that although he is still having symptoms, that I feel he is safe to be discharged. Surgical referral was provided. Scripts for pain meds provided.  1. Flank pain       MDM  41 year old male with left flank pain. Workup unclear as to etiology of this. CT abdomen pelvis is done to evaluate for possible ureteral colic and was negative for any acute abnormality. Labs are normal. Consider atypical ACS but very low suspicion. EKG with no concerning changes. Troponin is normal. Pt had reproducible tenderness in area of L inguinal canal when examined for hernia but no mass palpated. Medically cleared at this point. Low suspicion for acute abdominal surgical process. Patient seemed to be stable. Return precautions were discussed. Outpatient followup.        Raeford Razor, MD 07/31/11 1651  Raeford Razor, MD 07/31/11 1700

## 2011-07-31 NOTE — ED Notes (Signed)
EMT taking patient out to care with wheelchair. Patient's wife is driving patient home.

## 2011-07-31 NOTE — ED Notes (Signed)
Lt. Side chest pain began about 1 hour ago with dizziness describe as burning, pain began while eating lunch.  Pt. Is in NAD, skin is w/d

## 2011-07-31 NOTE — ED Notes (Signed)
Dr. Juleen China advised patient is allergic to dilaudid.

## 2011-08-13 ENCOUNTER — Emergency Department (HOSPITAL_COMMUNITY): Payer: No Typology Code available for payment source

## 2011-08-13 ENCOUNTER — Emergency Department (HOSPITAL_COMMUNITY)
Admission: EM | Admit: 2011-08-13 | Discharge: 2011-08-13 | Disposition: A | Discharge status: Home / Self Care | Payer: No Typology Code available for payment source | Attending: Emergency Medicine | Admitting: Emergency Medicine

## 2011-08-13 DIAGNOSIS — M79609 Pain in unspecified limb: Secondary | ICD-10-CM | POA: Insufficient documentation

## 2011-08-13 DIAGNOSIS — K219 Gastro-esophageal reflux disease without esophagitis: Secondary | ICD-10-CM | POA: Insufficient documentation

## 2011-08-13 DIAGNOSIS — S335XXA Sprain of ligaments of lumbar spine, initial encounter: Secondary | ICD-10-CM | POA: Insufficient documentation

## 2011-08-13 DIAGNOSIS — M545 Low back pain, unspecified: Secondary | ICD-10-CM | POA: Insufficient documentation

## 2011-08-13 DIAGNOSIS — S39012A Strain of muscle, fascia and tendon of lower back, initial encounter: Secondary | ICD-10-CM

## 2011-08-13 DIAGNOSIS — M25469 Effusion, unspecified knee: Secondary | ICD-10-CM | POA: Insufficient documentation

## 2011-08-13 DIAGNOSIS — M25569 Pain in unspecified knee: Secondary | ICD-10-CM | POA: Insufficient documentation

## 2011-08-13 DIAGNOSIS — IMO0001 Reserved for inherently not codable concepts without codable children: Secondary | ICD-10-CM | POA: Insufficient documentation

## 2011-08-13 MED ORDER — IBUPROFEN 800 MG PO TABS: 800.0000 mg | ORAL_TABLET | Freq: Three times a day (TID) | ORAL | Status: DC

## 2011-08-13 MED ORDER — IBUPROFEN 800 MG PO TABS
800.0000 mg | ORAL_TABLET | Freq: Once | ORAL | Status: AC
  Administered 2011-08-13: 800 mg via ORAL
  Filled 2011-08-13: qty 1

## 2011-08-13 MED ORDER — HYDROCODONE-ACETAMINOPHEN 5-325 MG PO TABS: 2.0000 | ORAL_TABLET | ORAL | Status: DC | PRN

## 2011-08-13 MED ORDER — OXYCODONE-ACETAMINOPHEN 5-325 MG PO TABS
ORAL_TABLET | ORAL | Status: AC
  Filled 2011-08-13: qty 1

## 2011-08-13 MED ORDER — OXYCODONE-ACETAMINOPHEN 5-325 MG PO TABS
2.0000 | ORAL_TABLET | Freq: Once | ORAL | Status: AC
  Administered 2011-08-13: 2 via ORAL
  Filled 2011-08-13: qty 1

## 2011-08-13 NOTE — ED Notes (Signed)
YNW:GN56<OZ> Expected date:08/13/11<BR> Expected time: 4:17 PM<BR> Means of arrival:Ambulance<BR> Comments:<BR> M61. 41 yo f. SOB, cough, xray confirmed pneumonia. From dr office. 25 mins
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
## 2011-08-19 ENCOUNTER — Encounter (HOSPITAL_COMMUNITY): Payer: Self-pay | Admitting: *Deleted

## 2011-08-19 ENCOUNTER — Emergency Department (HOSPITAL_COMMUNITY)
Admission: EM | Admit: 2011-08-19 | Discharge: 2011-08-19 | Disposition: A | Discharge status: Home / Self Care | Payer: BC Managed Care – PPO | Attending: Emergency Medicine | Admitting: Emergency Medicine

## 2011-08-19 ENCOUNTER — Emergency Department (HOSPITAL_COMMUNITY): Payer: BC Managed Care – PPO

## 2011-08-19 DIAGNOSIS — J111 Influenza due to unidentified influenza virus with other respiratory manifestations: Secondary | ICD-10-CM | POA: Insufficient documentation

## 2011-08-19 DIAGNOSIS — K219 Gastro-esophageal reflux disease without esophagitis: Secondary | ICD-10-CM | POA: Insufficient documentation

## 2011-08-19 DIAGNOSIS — R070 Pain in throat: Secondary | ICD-10-CM | POA: Insufficient documentation

## 2011-08-19 DIAGNOSIS — R61 Generalized hyperhidrosis: Secondary | ICD-10-CM | POA: Insufficient documentation

## 2011-08-19 DIAGNOSIS — Z87442 Personal history of urinary calculi: Secondary | ICD-10-CM | POA: Insufficient documentation

## 2011-08-19 DIAGNOSIS — R509 Fever, unspecified: Secondary | ICD-10-CM | POA: Insufficient documentation

## 2011-08-19 DIAGNOSIS — R42 Dizziness and giddiness: Secondary | ICD-10-CM | POA: Insufficient documentation

## 2011-08-19 DIAGNOSIS — R079 Chest pain, unspecified: Secondary | ICD-10-CM | POA: Insufficient documentation

## 2011-08-19 MED ORDER — KETOROLAC TROMETHAMINE 30 MG/ML IJ SOLN
30.0000 mg | Freq: Once | INTRAMUSCULAR | Status: AC
  Administered 2011-08-19: 30 mg via INTRAMUSCULAR
  Filled 2011-08-19: qty 1

## 2011-08-19 MED ORDER — OSELTAMIVIR PHOSPHATE 75 MG PO CAPS: 75.0000 mg | ORAL_CAPSULE | Freq: Two times a day (BID) | ORAL | Status: DC

## 2011-08-19 MED ORDER — HYDROCODONE-ACETAMINOPHEN 5-325 MG PO TABS: 1.0000 | ORAL_TABLET | Freq: Four times a day (QID) | ORAL | Status: DC | PRN

## 2011-08-19 MED ORDER — HYDROCODONE-ACETAMINOPHEN 5-325 MG PO TABS
1.0000 | ORAL_TABLET | Freq: Once | ORAL | Status: AC
  Administered 2011-08-19: 1 via ORAL
  Filled 2011-08-19: qty 1

## 2011-08-19 NOTE — Discharge Instructions (Signed)
Influenza Facts Flu (influenza) is a contagious respiratory illness caused by the influenza viruses. It can cause mild to severe illness. While most healthy people recover from the flu without specific treatment and without complications, older people, young children, and people with certain health conditions are at higher risk for serious complications from the flu, including death. CAUSES   The flu virus is spread from person to person by respiratory droplets from coughing and sneezing.   A person can also become infected by touching an object or surface with a virus on it and then touching their mouth, eye or nose.   Adults may be able to infect others from 1 day before symptoms occur and up to 7 days after getting sick. So it is possible to give someone the flu even before you know you are sick and continue to infect others while you are sick.  SYMPTOMS   Fever (usually high).   Headache.   Tiredness (can be extreme).   Cough.   Sore throat.   Runny or stuffy nose.   Body aches.   Diarrhea and vomiting may also occur, particularly in children.   These symptoms are referred to as "flu-like symptoms". A lot of different illnesses, including the common cold, can have similar symptoms.  DIAGNOSIS   There are tests that can determine if you have the flu as long you are tested within the first 2 or 3 days of illness.   A doctor's exam and additional tests may be needed to identify if you have a disease that is a complicating the flu.  RISKS AND COMPLICATIONS  Some of the complications caused by the flu include:  Bacterial pneumonia or progressive pneumonia caused by the flu virus.   Loss of body fluids (dehydration).   Worsening of chronic medical conditions, such as heart failure, asthma, or diabetes.   Sinus problems and ear infections.  HOME CARE INSTRUCTIONS   Seek medical care early on.   If you are at high risk from complications of the flu, consult your  health-care provider as soon as you develop flu-like symptoms. Those at high risk for complications include:   People 65 years or older.   People with chronic medical conditions, including diabetes.   Pregnant women.   Young children.   Your caregiver may recommend use of an antiviral medication to help treat the flu.   If you get the flu, get plenty of rest, drink a lot of liquids, and avoid using alcohol and tobacco.   You can take over-the-counter medications to relieve the symptoms of the flu if your caregiver approves. (Never give aspirin to children or teenagers who have flu-like symptoms, particularly fever).  PREVENTION  The single best way to prevent the flu is to get a flu vaccine each fall. Other measures that can help protect against the flu are:  Antiviral Medications   A number of antiviral drugs are approved for use in preventing the flu. These are prescription medications, and a doctor should be consulted before they are used.   Habits for Good Health   Cover your nose and mouth with a tissue when you cough or sneeze, throw the tissue away after you use it.   Wash your hands often with soap and water, especially after you cough or sneeze. If you are not near water, use an alcohol-based hand cleaner.   Avoid people who are sick.   If you get the flu, stay home from work or school. Avoid contact with  other people so that you do not make them sick, too.   Try not to touch your eyes, nose, or mouth as germs ore often spread this way.  IN CHILDREN, EMERGENCY WARNING SIGNS THAT NEED URGENT MEDICAL ATTENTION:  Fast breathing or trouble breathing.   Bluish skin color.   Not drinking enough fluids.   Not waking up or not interacting.   Being so irritable that the child does not want to be held.   Flu-like symptoms improve but then return with fever and worse cough.   Fever with a rash.  IN ADULTS, EMERGENCY WARNING SIGNS THAT NEED URGENT MEDICAL  ATTENTION:  Difficulty breathing or shortness of breath.   Pain or pressure in the chest or abdomen.   Sudden dizziness.   Confusion.   Severe or persistent vomiting.  SEEK IMMEDIATE MEDICAL CARE IF:  You or someone you know is experiencing any of the symptoms above. When you arrive at the emergency center,report that you think you have the flu. You may be asked to wear a mask and/or sit in a secluded area to protect others from getting sick. MAKE SURE YOU:   Understand these instructions.   Monitor your condition.   Seek medical care if you are getting worse, or not improving.  Document Released: 05/28/2003 Document Revised: 02/04/2011 Document Reviewed: 02/21/2009 [facility] Patient Information 2012 [place], [place].   RESOURCE GUIDE  Dental Problems  Patients with Medicaid: [facility] [phone] [address]                                           [id number] [employer] Phone:  [phone]                                                  Phone:  [phone]  If unable to pay or uninsured, contact:  [facility] or [facility]. to become qualified for the adult dental clinic.  Chronic Pain Problems Contact [facility]  [phone] Patients need to be referred by their primary care doctor.  Insufficient Money for Medicine Contact [employer]:  call "211" or [facility] [phone].  No Primary Care Doctor Call [facility]  [phone] Other agencies that provide inexpensive medical care    [facility]  [phone]    [facility]  [phone]    [facility]  [phone]    [facility]  [phone]    [facility]  [phone]    [facility]  [phone]  Psychological Services [facility]  [phone] [facility]  [phone] [facility]   [phone] (emergency services [phone])  Substance Abuse  Resources [facility]  [phone] [facility] [phone] [facility] [phone] Floydene Flock [phone] Residential & Outpatient Substance Abuse Program  [phone]  Abuse/Neglect [facility] Child Abuse Hotline 820-205-1372 [facility] Child Abuse Hotline [phone] (After Hours)  Emergency Shelter [facility] [phone]  Maternity Homes Room at [facility] [place] [facility] [place] [phone] [address] [facility] Services [phone]  MRSA  Hotline #:   [phone]    [facility] Resources  [facility]     [employer]                          [facility] Dept. [address] [place]                       [address]      [address]  Blondell Reveal Phone:  [phone]                                   Phone:  [phone]                 Phone:  [phone]  [facility] Phone:  [phone]  [facility] Child Abuse Hotline [phone] [phone] (After Hours)   Hydrocodone tablets What is this medicine? HYDROCODONE (hye droe KOE done) is used to help relieve cough. This medicine may be used for other purposes; ask your health care provider or pharmacist if you have questions. What should I tell my health care provider before I take this medicine? They need to know if you have any of these conditions: -brain tumor -drug abuse or addiction -head injury -heart disease -if you frequently drink alcohol-containing drinks -kidney disease -liver disease -lung disease, asthma, or breathing problems -mental problems -an allergic reaction to hydrocodone, other opioid analgesics, other medicines, lactose, foods, dyes, or preservatives -pregnant or trying to get pregnant -breast-feeding How should I use this medicine? Take this medicine by mouth  with a full glass of water. Follow the directions on the prescription label. Take your medicine at regular intervals. Do not take your medicine more often than directed. Talk to your pediatrician regarding the use of this medicine in children. This medicine is not approved for use in children less than 5 years old. Patients over 75 years old may have a stronger reaction and need a smaller dose. Overdosage: If you think you have taken too much of this medicine contact a poison control center or emergency room at once. NOTE: This medicine is only for you. Do not share this medicine with others. What if I miss a dose? If you miss a dose, take it as soon as you can. If it is almost time for your next dose, take only that dose. Do not take double or extra doses. What may interact with this medicine? -alcohol or medicines that contain alcohol -antihistamines -MAOIs like Carbex, Eldepryl, Marplan, Nardil, and Parnate -medicines for depression, anxiety, or psychotic disturbances -medicines for pain -medicines for sleep -naltrexone -tricyclic antidepressants like amitriptyline, clomipramine, desipramine, doxepin, imipramine, nortriptyline, and protriptyline This list may not describe all possible interactions. Give your health care provider a list of all the medicines, herbs, non-prescription drugs, or dietary supplements you use. Also tell them if you smoke, drink alcohol, or use illegal  drugs. Some items may interact with your medicine. What should I watch for while using this medicine? You may develop tolerance to this medicine if you take it for a long time. Tolerance means that you will get less cough relief with time. Tell your doctor or health care professional if your symptoms do not improve or if they get worse. You may get drowsy or dizzy. Do not drive, use machinery, or do anything that needs mental alertness until you know how this medicine affects you. Do not stand or sit up quickly,  especially if you are an older patient. This reduces the risk of dizzy or fainting spells. Alcohol may interfere with the effect of this medicine. Avoid alcoholic drinks. This medicine will cause constipation. Try to have a bowel movement at least every 2 to 3 days. If you do not have a bowel movement for 3 days, call your doctor or health care professional. What side effects may I notice from receiving this medicine? Side effects that you should report to your doctor or health care professional as soon as possible: -allergic reactions like skin rash, itching or hives, swelling of the face, lips, or tongue -breathing difficulties, wheezing -confusion -light headedness or fainting spells Side effects that usually do not require medical attention (report to your doctor or health care professional if they continue or are bothersome): -dizziness -drowsiness -itching -nausea -vomiting This list may not describe all possible side effects. Call your doctor for medical advice about side effects. You may report side effects to FDA at 1-800-FDA-1088. Where should I keep my medicine? Keep out of the reach of children. This medicine can be abused. Keep your medicine in a safe place to protect it from theft. Do not share this medicine with anyone. Selling or giving away this medicine is dangerous and against the law. Store at room temperature between 15 and 30 degrees C (59 and 86 degrees F). Protect from light. Throw away any unused medicine after the expiration date. NOTE: This sheet is a summary. It may not cover all possible information. If you have questions about this medicine, talk to your doctor, pharmacist, or health care provider.  2012, Elsevier/Gold Standard. (10/07/2007 2:39:42 PM)  Oseltamivir capsules What is this medicine? OSELTAMIVIR (os el TAM i vir) is an antiviral medicine. It is used to prevent and to treat some kinds of influenza or the flu. It will not work for colds or other viral  infections. This medicine may be used for other purposes; ask your health care provider or pharmacist if you have questions. What should I tell my health care provider before I take this medicine? They need to know if you have any of the following conditions: -heart disease -immune system problems -kidney disease -liver disease -lung disease -an unusual or allergic reaction to oseltamivir, other medicines, foods, dyes, or preservatives -pregnant or trying to get pregnant -breast-feeding How should I use this medicine? Take this medicine by mouth with a glass of water. Follow the directions on the prescription label. Start this medicine at the first sign of flu symptoms. You can take it with or without food. If it upsets your stomach, take it with food. Take your medicine at regular intervals. Do not take your medicine more often than directed. Take all of your medicine as directed even if you think you are better. Do not skip doses or stop your medicine early. Talk to your pediatrician regarding the use of this medicine in children. While this drug may be  prescribed for children as young as 6 year old for selected conditions, precautions do apply. Overdosage: If you think you have taken too much of this medicine contact a poison control center or emergency room at once. NOTE: This medicine is only for you. Do not share this medicine with others. What if I miss a dose? If you miss a dose, take it as soon as you remember. If it is almost time for your next dose (within 2 hours), take only that dose. Do not take double or extra doses. What may interact with this medicine? Interactions are not expected. This list may not describe all possible interactions. Give your health care provider a list of all the medicines, herbs, non-prescription drugs, or dietary supplements you use. Also tell them if you smoke, drink alcohol, or use illegal drugs. Some items may interact with your medicine. What should I  watch for while using this medicine? Visit your doctor or health care professional for regular check ups. Tell your doctor if your symptoms do not start to get better or if they get worse. If you have the flu, you may be at an increased risk of developing seizures, confusion, or abnormal behavior. This occurs early in the illness, and more frequently in children and teens. These events are not common, but may result in accidental injury to the patient. Families and caregivers of patients should watch for signs of unusual behavior and contact a doctor or health care professional right away if the patient shows signs of unusual behavior. This medicine is not a substitute for the flu shot. Talk to your doctor each year about an annual flu shot. What side effects may I notice from receiving this medicine? Side effects that you should report to your doctor or health care professional as soon as possible: -allergic reactions like skin rash, itching or hives, swelling of the face, lips, or tongue -anxiety, confusion, unusual behavior -breathing problems -hallucination, loss of contact with reality -redness, blistering, peeling or loosening of the skin, including inside the mouth -seizures Side effects that usually do not require medical attention (report to your doctor or health care professional if they continue or are bothersome): -cough -diarrhea -dizziness -headache -nausea, vomiting -stomach pain This list may not describe all possible side effects. Call your doctor for medical advice about side effects. You may report side effects to FDA at 1-800-FDA-1088. Where should I keep my medicine? Keep out of the reach of children. Store at room temperature between 15 and 30 degrees C (59 and 86 degrees F). Throw away any unused medicine after the expiration date. NOTE: This sheet is a summary. It may not cover all possible information. If you have questions about this medicine, talk to your doctor,  pharmacist, or health care provider.  2012, Elsevier/Gold Standard. (12/20/2007 4:36:17 PM)

## 2011-08-19 NOTE — ED Provider Notes (Signed)
History     CSN: [id number]  Arrival date & time 08/19/11  1603   First MD Initiated Contact with Patient 08/19/11 1918      Chief Complaint  Patient presents with  . Influenza  41yo w/ no sig. PMH with 2 days of cold, chills , sweats, dizzy, pains in chest, sore throat, HA.  Fever today of 103. Temp in ED was normal.  Denies any N/V/D but does work at [employer].  No tobacco or alcohol.   (Consider location/radiation/quality/duration/timing/severity/associated sxs/prior treatment) HPI  Past Medical History  Diagnosis Date  . GERD (gastroesophageal reflux disease)   . Kidney stones     History reviewed. No pertinent past surgical history.  History reviewed. No pertinent family history.  History  Substance Use Topics  . Smoking status: Never Smoker   . Smokeless tobacco: Not on file  . Alcohol Use: No      Review of Systems  All other systems reviewed and are negative.    Allergies  Antihistamines, diphenhydramine-type; Demerol; and Hydromorphone  Home Medications   Current Outpatient Rx  Name Route Sig Dispense Refill  . IBUPROFEN 200 MG PO TABS Oral Take 600 mg by mouth every 4 (four) hours as needed. pain      BP 115/62  Pulse 98  Temp(Src) 100.2 F (37.9 C) (Oral)  Resp 22  SpO2 99%  Physical Exam  Nursing note and vitals reviewed. Constitutional: He is oriented to person, place, and time. He appears well-developed and well-nourished. No distress.  HENT:  Head: Normocephalic and atraumatic.  Mouth/Throat: Oropharynx is clear and moist. No oropharyngeal exudate.  Eyes: Conjunctivae and EOM are normal. Pupils are equal, round, and reactive to light.  Neck: Neck supple.  Cardiovascular: Normal rate and regular rhythm.  Exam reveals no gallop and no friction rub.   No murmur heard. Pulmonary/Chest: Breath sounds normal. No respiratory distress. He has no wheezes. He has no rales. He exhibits no tenderness.       B/l rhonchi.   Abdominal: Soft.  Bowel sounds are normal. He exhibits no distension. There is no tenderness. There is no rebound and no guarding.  Musculoskeletal: Normal range of motion. He exhibits no edema and no tenderness.  Lymphadenopathy:    He has no cervical adenopathy.  Neurological: He is alert and oriented to person, place, and time. No cranial nerve deficit. Coordination normal.  Skin: Skin is warm and dry. No rash noted.  Psychiatric: He has a normal mood and affect.    ED Course  Procedures (including critical care time)  Labs Reviewed - No data to display No results found.   No diagnosis found.    MDM  Pt is seen and examined;  Initial history and physical completed.  Will follow.    Will obtain CXR to r/o pNeumonia;  But likely influenza.       Chest x-ray showed no acute disease. We'll treat him given instructions for influenza. Likely we'll not need Tamiflu.   Shaunda Tipping A. Patrica Duel, MD 08/19/11 2110

## 2011-08-19 NOTE — ED Notes (Signed)
Pt reports 2 day hx of flu like symptoms. Reports fever body aches. Denies n/v/d. Reports poor appetite. Reports taking ibuprofen approx 2 hours ago for 103 fever. Mask placed on pt.

## 2011-08-19 NOTE — ED Notes (Signed)
Given ginger ale 

## 2011-08-22 ENCOUNTER — Encounter (HOSPITAL_COMMUNITY): Payer: Self-pay | Admitting: Nurse Practitioner

## 2011-08-22 ENCOUNTER — Emergency Department (HOSPITAL_COMMUNITY)
Admission: EM | Admit: 2011-08-22 | Discharge: 2011-08-23 | Disposition: A | Discharge status: Home / Self Care | Payer: BC Managed Care – PPO | Attending: Emergency Medicine | Admitting: Emergency Medicine

## 2011-08-22 DIAGNOSIS — J189 Pneumonia, unspecified organism: Secondary | ICD-10-CM | POA: Insufficient documentation

## 2011-08-22 DIAGNOSIS — Z87442 Personal history of urinary calculi: Secondary | ICD-10-CM | POA: Insufficient documentation

## 2011-08-22 LAB — BASIC METABOLIC PANEL
BUN: 7 mg/dL (ref 6–23)
CO2: 24 mEq/L (ref 19–32)
Calcium: 8.2 mg/dL — ABNORMAL LOW (ref 8.4–10.5)
Chloride: 101 mEq/L (ref 96–112)
Creatinine, Ser: 1 mg/dL (ref 0.50–1.35)
GFR calc Af Amer: >90 mL/min (ref >90)
GFR calc non Af Amer: >90 mL/min (ref >90)
Glucose, Bld: 96 mg/dL (ref 70–99)
Potassium: 3.1 mEq/L — ABNORMAL LOW (ref 3.5–5.1)
Sodium: 136 mEq/L (ref 135–145)

## 2011-08-22 LAB — URINALYSIS, ROUTINE W REFLEX MICROSCOPIC
Bilirubin Urine: NEGATIVE
Glucose, UA: NEGATIVE mg/dL
Hgb urine dipstick: NEGATIVE
Ketones, ur: NEGATIVE mg/dL
Leukocytes, UA: NEGATIVE
Nitrite: NEGATIVE
Protein, ur: NEGATIVE mg/dL
Specific Gravity, Urine: 1.004 — ABNORMAL LOW (ref 1.005–1.030)
Urobilinogen, UA: 0.2 mg/dL (ref 0.0–1.0)
pH: 6.5 (ref 5.0–8.0)

## 2011-08-22 LAB — CBC
HCT: 39.5 % (ref 39.0–52.0)
Hemoglobin: 13.2 g/dL (ref 13.0–17.0)
MCH: 27.4 pg (ref 26.0–34.0)
MCHC: 33.4 g/dL (ref 30.0–36.0)
MCV: 82 fL (ref 78.0–100.0)
Platelets: 149 10*3/uL — ABNORMAL LOW (ref 150–400)
RBC: 4.82 MIL/uL (ref 4.22–5.81)
RDW: 12.4 % (ref 11.5–15.5)
WBC: 7.9 10*3/uL (ref 4.0–10.5)

## 2011-08-22 LAB — DIFFERENTIAL
Basophils Absolute: 0 10*3/uL (ref 0.0–0.1)
Basophils Relative: 0 % (ref 0–1)
Eosinophils Absolute: 0 10*3/uL (ref 0.0–0.7)
Eosinophils Relative: 0 % (ref 0–5)
Lymphocytes Relative: 19 % (ref 12–46)
Lymphs Abs: 1.5 10*3/uL (ref 0.7–4.0)
Monocytes Absolute: 0.9 10*3/uL (ref 0.1–1.0)
Monocytes Relative: 12 % (ref 3–12)
Neutro Abs: 5.5 10*3/uL (ref 1.7–7.7)
Neutrophils Relative %: 69 % (ref 43–77)

## 2011-08-22 MED ORDER — IBUPROFEN 800 MG PO TABS
800.0000 mg | ORAL_TABLET | Freq: Once | ORAL | Status: AC
  Administered 2011-08-22: 800 mg via ORAL
  Filled 2011-08-22: qty 1

## 2011-08-22 MED ORDER — ACETAMINOPHEN 325 MG PO TABS
650.0000 mg | ORAL_TABLET | Freq: Once | ORAL | Status: AC
  Administered 2011-08-22: 650 mg via ORAL
  Filled 2011-08-22: qty 2

## 2011-08-22 MED ORDER — SODIUM CHLORIDE 0.9 % IV BOLUS (SEPSIS)
1000.0000 mL | Freq: Once | INTRAVENOUS | Status: AC
  Administered 2011-08-22: 1000 mL via INTRAVENOUS

## 2011-08-22 MED ORDER — AZITHROMYCIN 250 MG PO TABS: ORAL_TABLET | ORAL | Status: AC

## 2011-08-22 MED ORDER — DEXTROSE 5 % IV SOLN
500.0000 mg | INTRAVENOUS | Status: DC
  Administered 2011-08-22: 500 mg via INTRAVENOUS
  Filled 2011-08-22: qty 500

## 2011-08-22 MED ORDER — ONDANSETRON HCL 4 MG/2ML IJ SOLN
4.0000 mg | Freq: Once | INTRAMUSCULAR | Status: AC
  Administered 2011-08-22: 4 mg via INTRAVENOUS
  Filled 2011-08-22: qty 2

## 2011-08-22 NOTE — ED Notes (Signed)
Pt diagnosed with flu here last week, reports he is not getting any better. C/o continued body aches, chills, diarrhea since onset. Unable to tolerate po fluids

## 2011-08-22 NOTE — ED Provider Notes (Signed)
History     CSN: [id number]  Arrival date & time 08/22/11  0865   First MD Initiated Contact with Patient 08/22/11 2116      Chief Complaint  Patient presents with  . Influenza     HPI  History provided by the patient and spouse. Patient is a 41 year old male with history of GERD and kidney stones who presents with complaints of generalized body aches, fever, chills and cough for the past 5 days. Symptoms have also been associated with diarrhea that began yesterday. Reports several episodes of loose watery stools. Has also developed mild abdominal cramping it is very intermittent and brief. Patient reports being evaluated for his cough, fever and chill symptoms 3 days ago the emergency room. At that time he reports having chest x-ray and was told there was no signs for pneumonia infection. Patient was instructed to use symptomatic treatment with over-the-counter cough medications, Tylenol or ibuprofen for fever. He reports taking occasional doses of ibuprofen with some improvement of fever over the past few days. Patient has had increasing fatigue and poor sleep at night. Patient had any recent foreign travel or known sick contacts.   Past Medical History  Diagnosis Date  . GERD (gastroesophageal reflux disease)   . Kidney stones     Past Surgical History  Procedure Date  . Knee surgery     History reviewed. No pertinent family history.  History  Substance Use Topics  . Smoking status: Never Smoker   . Smokeless tobacco: Not on file  . Alcohol Use: No      Review of Systems  Constitutional: Positive for fever, chills, diaphoresis, appetite change and fatigue.  Respiratory: Positive for cough. Negative for shortness of breath.   Cardiovascular: Positive for chest pain. Negative for palpitations.  Gastrointestinal: Positive for diarrhea. Negative for nausea, vomiting, abdominal pain and constipation.  Genitourinary: Negative for dysuria, frequency, hematuria and flank  pain.  Musculoskeletal: Positive for myalgias.  Skin: Negative for rash.  Neurological: Positive for light-headedness.  All other systems reviewed and are negative.    Allergies  Morphine and related; Antihistamines, diphenhydramine-type; Demerol; and Hydromorphone  Home Medications   Current Outpatient Rx  Name Route Sig Dispense Refill  . IBUPROFEN 200 MG PO TABS Oral Take 600 mg by mouth every 4 (four) hours as needed. pain      BP 113/75  Pulse 98  Temp(Src) 98.9 F (37.2 C) (Oral)  Resp 16  Ht 5\' 9"  (1.753 m)  Wt 170 lb (77.111 kg)  BMI 25.10 kg/m2  SpO2 99%  Physical Exam  Nursing note and vitals reviewed. Constitutional: He is oriented to person, place, and time. He appears well-developed and well-nourished. No distress.  HENT:  Head: Normocephalic and atraumatic.  Mouth/Throat: Oropharynx is clear and moist.  Eyes: Conjunctivae and EOM are normal. Pupils are equal, round, and reactive to light.  Neck: Normal range of motion. Neck supple.       No meningeal signs  Cardiovascular: Normal rate and regular rhythm.   Pulmonary/Chest: Effort normal and breath sounds normal. No respiratory distress. He has no wheezes. He has no rales. He exhibits no tenderness.  Abdominal: Soft. He exhibits no distension. There is no rebound, no guarding and no CVA tenderness.       Mild diffuse tenderness  Musculoskeletal: He exhibits no edema and no tenderness.  Lymphadenopathy:    He has no cervical adenopathy.  Neurological: He is alert and oriented to person, place, and time.  Skin: Skin  is warm. No rash noted. He is diaphoretic.  Psychiatric: His behavior is normal.    ED Course  Procedures  Results for orders placed during the hospital encounter of 08/22/11  CBC      Component Value Range   WBC 7.9  4.0 - 10.5 (K/uL)   RBC 4.82  4.22 - 5.81 (MIL/uL)   Hemoglobin 13.2  13.0 - 17.0 (g/dL)   HCT 62.9  52.8 - 41.3 (%)   MCV 82.0  78.0 - 100.0 (fL)   MCH 27.4  26.0 -  34.0 (pg)   MCHC 33.4  30.0 - 36.0 (g/dL)   RDW 24.4  01.0 - 27.2 (%)   Platelets 149 (*) 150 - 400 (K/uL)  DIFFERENTIAL      Component Value Range   Neutrophils Relative 69  43 - 77 (%)   Lymphocytes Relative 19  12 - 46 (%)   Monocytes Relative 12  3 - 12 (%)   Eosinophils Relative 0  0 - 5 (%)   Basophils Relative 0  0 - 1 (%)   Neutro Abs 5.5  1.7 - 7.7 (K/uL)   Lymphs Abs 1.5  0.7 - 4.0 (K/uL)   Monocytes Absolute 0.9  0.1 - 1.0 (K/uL)   Eosinophils Absolute 0.0  0.0 - 0.7 (K/uL)   Basophils Absolute 0.0  0.0 - 0.1 (K/uL)   RBC Morphology POLYCHROMASIA PRESENT    BASIC METABOLIC PANEL      Component Value Range   Sodium 136  135 - 145 (mEq/L)   Potassium 3.1 (*) 3.5 - 5.1 (mEq/L)   Chloride 101  96 - 112 (mEq/L)   CO2 24  19 - 32 (mEq/L)   Glucose, Bld 96  70 - 99 (mg/dL)   BUN 7  6 - 23 (mg/dL)   Creatinine, Ser 5.36  0.50 - 1.35 (mg/dL)   Calcium 8.2 (*) 8.4 - 10.5 (mg/dL)   GFR calc non Af Amer >90  >90 (mL/min)   GFR calc Af Amer >90  >90 (mL/min)  URINALYSIS, ROUTINE W REFLEX MICROSCOPIC      Component Value Range   Color, Urine STRAW (*) YELLOW    APPearance CLEAR  CLEAR    Specific Gravity, Urine 1.004 (*) 1.005 - 1.030    pH 6.5  5.0 - 8.0    Glucose, UA NEGATIVE  NEGATIVE (mg/dL)   Hgb urine dipstick NEGATIVE  NEGATIVE    Bilirubin Urine NEGATIVE  NEGATIVE    Ketones, ur NEGATIVE  NEGATIVE (mg/dL)   Protein, ur NEGATIVE  NEGATIVE (mg/dL)   Urobilinogen, UA 0.2  0.0 - 1.0 (mg/dL)   Nitrite NEGATIVE  NEGATIVE    Leukocytes, UA NEGATIVE  NEGATIVE        1. CAP (community acquired pneumonia)       MDM  Patient seen and evaluated. Patient in no distress. Patient with slight diaphoresis. Patient does not appear severely toxic. He is febrile with temp 102. Tylenol given. Heart rate and blood pressure normal.  Chest x-ray reviewed from 3 days ago. No signs for pneumonia infection. Pt continues with worsening symptoms and fever.  Fever improved with  tylenol.  Pt discussed with attending physician.   Pt with clinical concern for possible PNA infx plan to treat with Rx z-pak.  Pt and family agree with plan.  Pt also feeling better after fluids.  Will d/c home at this time.   Medical screening examination/treatment/procedure(s) were performed by non-physician practitioner and as supervising physician I was  immediately available for consultation/collaboration. Osvaldo Human, M.D.      Angus Seller, PA 08/23/11 1903  Carleene Cooper III, MD 08/25/11 [phone]

## 2011-08-22 NOTE — Discharge Instructions (Signed)
You were seen and evaluated again today for your symptoms of fever, chills, bodyaches and cough. Your lab tests today have not shown any concerning signs for infection. Your providers today are concerned for pneumonia infection. You treated with antibiotics in the emergency room and given a prescription for antibiotics to continue at home. Please take these as prescribed for the full length of time. Return to the emergency room for worsening shortness of breath, chest pain, persistent nausea vomiting.   Pneumonia, Adult Pneumonia is an infection of the lungs.  CAUSES Pneumonia may be caused by bacteria or a virus. Usually, these infections are caused by breathing infectious particles into the lungs (respiratory tract). SYMPTOMS   Cough.   Fever.   Chest pain.   Increased rate of breathing.   Wheezing.   Mucus production.  DIAGNOSIS  If you have the common symptoms of pneumonia, your caregiver will typically confirm the diagnosis with a chest X-ray. The X-ray will show an abnormality in the lung (pulmonary infiltrate) if you have pneumonia. Other tests of your blood, urine, or sputum may be done to find the specific cause of your pneumonia. Your caregiver may also do tests (blood gases or pulse oximetry) to see how well your lungs are working. TREATMENT  Some forms of pneumonia may be spread to other people when you cough or sneeze. You may be asked to wear a mask before and during your exam. Pneumonia that is caused by bacteria is treated with antibiotic medicine. Pneumonia that is caused by the influenza virus may be treated with an antiviral medicine. Most other viral infections must run their course. These infections will not respond to antibiotics.  PREVENTION A pneumococcal shot (vaccine) is available to prevent a common bacterial cause of pneumonia. This is usually suggested for:  People over 11 years old.   Patients on chemotherapy.   People with chronic lung problems, such as  bronchitis or emphysema.   People with immune system problems.  If you are over 65 or have a high risk condition, you may receive the pneumococcal vaccine if you have not received it before. In some countries, a routine influenza vaccine is also recommended. This vaccine can help prevent some cases of pneumonia.You may be offered the influenza vaccine as part of your care. If you smoke, it is time to quit. You may receive instructions on how to stop smoking. Your caregiver can provide medicines and counseling to help you quit. HOME CARE INSTRUCTIONS   Cough suppressants may be used if you are losing too much rest. However, coughing protects you by clearing your lungs. You should avoid using cough suppressants if you can.   Your caregiver may have prescribed medicine if he or she thinks your pneumonia is caused by a bacteria or influenza. Finish your medicine even if you start to feel better.   Your caregiver may also prescribe an expectorant. This loosens the mucus to be coughed up.   Only take over-the-counter or prescription medicines for pain, discomfort, or fever as directed by your caregiver.   Do not smoke. Smoking is a common cause of bronchitis and can contribute to pneumonia. If you are a smoker and continue to smoke, your cough may last several weeks after your pneumonia has cleared.   A cold steam vaporizer or humidifier in your room or home may help loosen mucus.   Coughing is often worse at night. Sleeping in a semi-upright position in a recliner or using a couple pillows under your head  will help with this.   Get rest as you feel it is needed. Your body will usually let you know when you need to rest.  SEEK IMMEDIATE MEDICAL CARE IF:   Your illness becomes worse. This is especially true if you are elderly or weakened from any other disease.   You cannot control your cough with suppressants and are losing sleep.   You begin coughing up blood.   You develop pain which is  getting worse or is uncontrolled with medicines.   You have a fever.   Any of the symptoms which initially brought you in for treatment are getting worse rather than better.   You develop shortness of breath or chest pain.  MAKE SURE YOU:   Understand these instructions.   Will watch your condition.   Will get help right away if you are not doing well or get worse.  Document Released: 05/25/2005 Document Revised: 05/14/2011 Document Reviewed: 08/14/2010 [facility] Patient Information 2012 [place], [place].

## 2011-10-17 ENCOUNTER — Emergency Department (HOSPITAL_COMMUNITY)
Admission: EM | Admit: 2011-10-17 | Discharge: 2011-10-17 | Disposition: A | Discharge status: Home / Self Care | Payer: BC Managed Care – PPO | Attending: Emergency Medicine | Admitting: Emergency Medicine

## 2011-10-17 ENCOUNTER — Encounter (HOSPITAL_COMMUNITY): Payer: Self-pay

## 2011-10-17 ENCOUNTER — Emergency Department (HOSPITAL_COMMUNITY): Payer: BC Managed Care – PPO

## 2011-10-17 DIAGNOSIS — R079 Chest pain, unspecified: Secondary | ICD-10-CM | POA: Insufficient documentation

## 2011-10-17 DIAGNOSIS — M542 Cervicalgia: Secondary | ICD-10-CM | POA: Insufficient documentation

## 2011-10-17 DIAGNOSIS — R0789 Other chest pain: Secondary | ICD-10-CM

## 2011-10-17 DIAGNOSIS — R071 Chest pain on breathing: Secondary | ICD-10-CM | POA: Insufficient documentation

## 2011-10-17 DIAGNOSIS — R200 Anesthesia of skin: Secondary | ICD-10-CM

## 2011-10-17 DIAGNOSIS — K219 Gastro-esophageal reflux disease without esophagitis: Secondary | ICD-10-CM | POA: Insufficient documentation

## 2011-10-17 DIAGNOSIS — R51 Headache: Secondary | ICD-10-CM | POA: Insufficient documentation

## 2011-10-17 DIAGNOSIS — R42 Dizziness and giddiness: Secondary | ICD-10-CM | POA: Insufficient documentation

## 2011-10-17 DIAGNOSIS — R29898 Other symptoms and signs involving the musculoskeletal system: Secondary | ICD-10-CM | POA: Insufficient documentation

## 2011-10-17 DIAGNOSIS — R209 Unspecified disturbances of skin sensation: Secondary | ICD-10-CM | POA: Insufficient documentation

## 2011-10-17 LAB — DIFFERENTIAL
Basophils Absolute: 0 10*3/uL (ref 0.0–0.1)
Basophils Relative: 1 % (ref 0–1)
Eosinophils Absolute: 0.1 10*3/uL (ref 0.0–0.7)
Eosinophils Relative: 2 % (ref 0–5)
Lymphocytes Relative: 43 % (ref 12–46)
Lymphs Abs: 2.8 10*3/uL (ref 0.7–4.0)
Monocytes Absolute: 0.6 10*3/uL (ref 0.1–1.0)
Monocytes Relative: 9 % (ref 3–12)
Neutro Abs: 2.9 10*3/uL (ref 1.7–7.7)
Neutrophils Relative %: 45 % (ref 43–77)

## 2011-10-17 LAB — COMPREHENSIVE METABOLIC PANEL
ALT: 14 U/L (ref 0–53)
AST: 16 U/L (ref 0–37)
Albumin: 3.9 g/dL (ref 3.5–5.2)
Alkaline Phosphatase: 54 U/L (ref 39–117)
BUN: 15 mg/dL (ref 6–23)
CO2: 27 mEq/L (ref 19–32)
Calcium: 9 mg/dL (ref 8.4–10.5)
Chloride: 106 mEq/L (ref 96–112)
Creatinine, Ser: 1.03 mg/dL (ref 0.50–1.35)
GFR calc Af Amer: >90 mL/min (ref >90)
GFR calc non Af Amer: 88 mL/min — ABNORMAL LOW (ref >90)
Glucose, Bld: 99 mg/dL (ref 70–99)
Potassium: 3.6 mEq/L (ref 3.5–5.1)
Sodium: 142 mEq/L (ref 135–145)
Total Bilirubin: 0.2 mg/dL — ABNORMAL LOW (ref 0.3–1.2)
Total Protein: 7 g/dL (ref 6.0–8.3)

## 2011-10-17 LAB — CBC
HCT: 40.8 % (ref 39.0–52.0)
Hemoglobin: 13.3 g/dL (ref 13.0–17.0)
MCH: 27.3 pg (ref 26.0–34.0)
MCHC: 32.6 g/dL (ref 30.0–36.0)
MCV: 83.8 fL (ref 78.0–100.0)
Platelets: 190 10*3/uL (ref 150–400)
RBC: 4.87 MIL/uL (ref 4.22–5.81)
RDW: 12.9 % (ref 11.5–15.5)
WBC: 6.4 10*3/uL (ref 4.0–10.5)

## 2011-10-17 LAB — APTT: aPTT: 29 seconds (ref 24–37)

## 2011-10-17 LAB — POCT I-STAT TROPONIN I: Troponin i, poc: 0 ng/mL (ref 0.00–0.08)

## 2011-10-17 LAB — PROTIME-INR
INR: 0.96 (ref 0.00–1.49)
Prothrombin Time: 13 seconds (ref 11.6–15.2)

## 2011-10-17 MED ORDER — ASPIRIN 81 MG PO CHEW
324.0000 mg | CHEWABLE_TABLET | Freq: Once | ORAL | Status: AC
  Administered 2011-10-17: 324 mg via ORAL
  Filled 2011-10-17: qty 4

## 2011-10-17 MED ORDER — METHOCARBAMOL 500 MG PO TABS: ORAL_TABLET | ORAL | Status: DC

## 2011-10-17 MED ORDER — METHOCARBAMOL 500 MG PO TABS
1000.0000 mg | ORAL_TABLET | Freq: Three times a day (TID) | ORAL | Status: DC
  Administered 2011-10-17: 1000 mg via ORAL
  Filled 2011-10-17: qty 2

## 2011-10-17 MED ORDER — KETOROLAC TROMETHAMINE 30 MG/ML IJ SOLN
30.0000 mg | Freq: Once | INTRAMUSCULAR | Status: AC
  Administered 2011-10-17: 30 mg via INTRAVENOUS
  Filled 2011-10-17: qty 1

## 2011-10-17 NOTE — ED Provider Notes (Signed)
History     CSN: [id number]  Arrival date & time 10/17/11  4098   First MD Initiated Contact with Patient 10/17/11 1900      Chief Complaint  Patient presents with  . Chest Pain    (Consider location/radiation/quality/duration/timing/severity/associated sxs/prior treatment) HPI  Patient reports about 2 weeks ago he started getting intermittent numbness in his left hand and he couldn't open a bottle with his hand because it was weak. He states he also started having pain radiating into the left side of his neck and then he started having chest pain in the left upper chest and felt like something was moving up his chest into his left neck. He relates initially it was intermittent however it today has gotten more constant. He also states his feet feel cold today. He states he gets chest pressure today that lasted for a brief second and happens about every 45 minutes. He states also the movement from his chest and his neck will last a "quick second". He had a headache yesterday and today and states the headache is left-sided and throbbing. He denies nausea, vomiting, diarrhea, or photophobia. He denies shortness of breath. He states he feels lightheaded and sometimes feels like things are spinning but that was a couple days ago he denies cough or fever. Wife relates he had pneumonia a few months ago and he was treated overnight in the hospital. He also had a stress test done in 2006 by their family doctor that was normal because of his family history of heart disease.  PCP Dr. Tanya Nones at [facility]  Past Medical History  Diagnosis Date  . GERD (gastroesophageal reflux disease)   . Kidney stones     Past Surgical History  Procedure Date  . Knee surgery     History reviewed. No pertinent family history. Patient relates his father has coronary artery disease and died in the year early age He relates his paternal uncle and most of his male cousins have had coronary artery  disease at a young age. Patient states he had a brother that died at 70 from an acute MI.  History  Substance Use Topics  . Smoking status: Never Smoker   . Smokeless tobacco: Not on file  . 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    Allergies  Morphine and related; Antihistamines, diphenhydramine-type; Demerol; and Hydromorphone  Home Medications   Current Outpatient Rx  Name Route Sig Dispense Refill  . IBUPROFEN 200 MG PO TABS Oral Take 600 mg by mouth every 4 (four) hours as needed. pain      BP 125/74  Pulse 72  Temp(Src) 98.3 F (36.8 C) (Oral)  Resp 14  SpO2 98%  Vital signs normal    Physical Exam  Nursing note and vitals reviewed. Constitutional: He is oriented to person, place, and time. He appears well-developed and well-nourished.  Non-toxic appearance. He does not appear ill. No distress.  HENT:  Head: Normocephalic and atraumatic.  Right Ear: External ear normal.  Left Ear: External ear normal.  Nose: Nose normal. No mucosal edema or rhinorrhea.  Mouth/Throat: Oropharynx is clear and moist and mucous membranes are normal. No dental abscesses or uvula swelling.  Eyes: Conjunctivae and EOM are normal. Pupils are equal, round, and reactive to light.  Neck: Normal range of motion and full passive range of motion without pain. Neck supple.  Cardiovascular: Normal  rate, regular rhythm and normal heart sounds.  Exam reveals no gallop and no friction rub.   No murmur heard. Pulmonary/Chest: Effort normal and breath sounds normal. No respiratory distress. He has no wheezes. He has no rhonchi. He has no rales. He exhibits no tenderness and no crepitus.  Abdominal: Soft. Normal appearance and bowel sounds are normal. He exhibits no distension. There is no tenderness. There is no rebound and no guarding.  Musculoskeletal: Normal range of motion. He exhibits no edema and no  tenderness.       Moves all extremities well.   Neurological: He is alert and oriented to person, place, and time. He has normal strength. No cranial nerve deficit.  Skin: Skin is warm, dry and intact. No rash noted. No erythema. No pallor.  Psychiatric: He has a normal mood and affect. His speech is normal and behavior is normal. His mood appears not anxious.    ED Course  Procedures (including critical care time)   Medications  methocarbamol (ROBAXIN) tablet 1,000 mg (not administered)  methocarbamol (ROBAXIN) 500 MG tablet (not administered)  ketorolac (TORADOL) 30 MG/ML injection 30 mg (30 mg Intravenous Given 10/17/11 2020)  aspirin chewable tablet 324 mg (324 mg Oral Given 10/17/11 2020)   Pt is feeling better, has discussed his test results.   Results for orders placed during the hospital encounter of 10/17/11  CBC      Component Value Range   WBC 6.4  4.0 - 10.5 (K/uL)   RBC 4.87  4.22 - 5.81 (MIL/uL)   Hemoglobin 13.3  13.0 - 17.0 (g/dL)   HCT 40.9  81.1 - 91.4 (%)   MCV 83.8  78.0 - 100.0 (fL)   MCH 27.3  26.0 - 34.0 (pg)   MCHC 32.6  30.0 - 36.0 (g/dL)   RDW 78.2  95.6 - 21.3 (%)   Platelets 190  150 - 400 (K/uL)  DIFFERENTIAL      Component Value Range   Neutrophils Relative 45  43 - 77 (%)   Neutro Abs 2.9  1.7 - 7.7 (K/uL)   Lymphocytes Relative 43  12 - 46 (%)   Lymphs Abs 2.8  0.7 - 4.0 (K/uL)   Monocytes Relative 9  3 - 12 (%)   Monocytes Absolute 0.6  0.1 - 1.0 (K/uL)   Eosinophils Relative 2  0 - 5 (%)   Eosinophils Absolute 0.1  0.0 - 0.7 (K/uL)   Basophils Relative 1  0 - 1 (%)   Basophils Absolute 0.0  0.0 - 0.1 (K/uL)  COMPREHENSIVE METABOLIC PANEL      Component Value Range   Sodium 142  135 - 145 (mEq/L)   Potassium 3.6  3.5 - 5.1 (mEq/L)   Chloride 106  96 - 112 (mEq/L)   CO2 27  19 - 32 (mEq/L)   Glucose, Bld 99  70 - 99 (mg/dL)   BUN 15  6 - 23 (mg/dL)   Creatinine, Ser 0.86  0.50 - 1.35 (mg/dL)   Calcium 9.0  8.4 - 57.8 (mg/dL)   Total  Protein 7.0  6.0 - 8.3 (g/dL)   Albumin 3.9  3.5 - 5.2 (g/dL)   AST 16  0 - 37 (U/L)   ALT 14  0 - 53 (U/L)   Alkaline Phosphatase 54  39 - 117 (U/L)   Total Bilirubin 0.2 (*) 0.3 - 1.2 (mg/dL)   GFR calc non Af Amer 88 (*) >90 (mL/min)   GFR calc Af Amer >90  >  90 (mL/min)  APTT      Component Value Range   aPTT 29  24 - 37 (seconds)  PROTIME-INR      Component Value Range   Prothrombin Time 13.0  11.6 - 15.2 (seconds)   INR 0.96  0.00 - 1.49   POCT I-STAT TROPONIN I      Component Value Range   Troponin i, poc 0.00  0.00 - 0.08 (ng/mL)   Comment 3            Laboratory interpretation all normal   Dg Chest 2 View  10/17/2011  *RADIOLOGY REPORT*  Clinical Data: Chest pain and left arm numbness.  CHEST - 2 VIEW  Comparison: PA and lateral chest 08/19/2011 and 06/26/2010.  Findings: Lungs are clear.  Heart size is normal.  No pneumothorax or pleural fluid.  IMPRESSION: Negative chest.  Original Report Authenticated By: Bernadene Bell. D'ALESSIO, M.D.   Dg Cervical Spine Complete  10/17/2011  *RADIOLOGY REPORT*  Clinical Data: Left arm numbness.  Neck pain extending into the left arm.  CERVICAL SPINE - COMPLETE 4+ VIEW  Comparison: CT of the cervical spine 06/27/2009.  Findings: The cervical spine is visualized from the skull base through the cervicothoracic junction.  Minimal degenerative changes are present at C5-6 and C6-7.  There is no significant osseous foraminal stenosis.  There is some straightening of the normal cervical lordosis.  IMPRESSION:  1.  No acute abnormality. 2.  Minimal degenerative changes as described.  Original Report Authenticated By: Jamesetta Orleans. MATTERN, M.D.   Ct Head Wo Contrast  10/17/2011  *RADIOLOGY REPORT*  Clinical Data: Dizziness beginning yesterday.  Headaches.  CT HEAD WITHOUT CONTRAST  Technique:  Contiguous axial images were obtained from the base of the skull through the vertex without contrast.  Comparison: CT head without contrast 11/15/2010.  Findings:  No acute cortical infarct, hemorrhage, mass lesion is present.  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  The ventricles are of normal size.  No significant extra-axial fluid collection is present.  The paranasal sinuses and mastoid air cells are clear.  The osseous skull is intact.  IMPRESSION: Negative CT of the head.  Original Report Authenticated By: Jamesetta Orleans. MATTERN, M.D.     Date: 10/17/2011  Rate: 72  Rhythm: normal sinus rhythm  QRS Axis: normal  Intervals: normal  ST/T Wave abnormalities: early repolarization  Conduction Disutrbances:none  Narrative Interpretation:   Old EKG Reviewed: unchanged from 08/01/11       1. Chest wall pain   2. Arm numbness left   3. Musculoskeletal neck pain    Patient's Medications  New Prescriptions   METHOCARBAMOL (ROBAXIN) 500 MG TABLET    Take 1 or 2 po Q 6hrs for pain  Previous Medications   IBUPROFEN (ADVIL,MOTRIN) 200 MG TABLET    Take 600 mg by mouth every 4 (four) hours as needed. pain  Modified Medications   No medications on file  Discontinued Medications   No medications on file   Plan discharge Devoria Albe, MD, Armando Gang   MDM          Ward Givens, MD 10/17/11 2153

## 2011-10-17 NOTE — Discharge Instructions (Signed)
Heat on your neck and chest area that is painful. Take ibuprofen 600 mg 4 times a day with the Robaxin one or 2 tablets 4 times a day. Call Dr. Caren Macadam office on Monday to get a cardiology referral. It would be prudent to see a cardiologist with your strong family history of heart disease. Return to the emergency department if you get constant pain that lasts 30 minutes constantly not for brief second.

## 2011-10-17 NOTE — ED Notes (Signed)
Pt in from home with c/o chest pain x1 week states worsening today pt states pain is pressure like radiating to the left arm and neck pt denies sob states some dizziness at times pt also states left arm numbness

## 2011-10-17 NOTE — ED Notes (Signed)
Bed:WA16<BR> Expected date:<BR> Expected time:<BR> Means of arrival:<BR> Comments:<BR> Hold for triage 1

## 2012-03-22 ENCOUNTER — Encounter (HOSPITAL_COMMUNITY): Payer: Self-pay | Admitting: *Deleted

## 2012-03-22 ENCOUNTER — Emergency Department (HOSPITAL_COMMUNITY)
Admission: EM | Admit: 2012-03-22 | Discharge: 2012-03-23 | Disposition: A | Discharge status: Home / Self Care | Payer: BC Managed Care – PPO | Attending: Emergency Medicine | Admitting: Emergency Medicine

## 2012-03-22 DIAGNOSIS — R232 Flushing: Secondary | ICD-10-CM | POA: Insufficient documentation

## 2012-03-22 DIAGNOSIS — R55 Syncope and collapse: Secondary | ICD-10-CM | POA: Insufficient documentation

## 2012-03-22 DIAGNOSIS — R51 Headache: Secondary | ICD-10-CM | POA: Insufficient documentation

## 2012-03-22 LAB — GLUCOSE, CAPILLARY: Glucose-Capillary: 127 mg/dL — ABNORMAL HIGH (ref 70–99)

## 2012-03-22 NOTE — ED Notes (Signed)
Pt states was sitting down and had a "rush" come over him with a sensation that moved from his chest up to his head; he didn't feel good so he sat down and had another spell that his wife says he passed out with; pt states continues to not feel good; c/o headache; no weakness or numbness; equal grips; no facial droop; c/o left upper quad pain yesterday; states pain continues to come and go

## 2012-03-23 ENCOUNTER — Emergency Department (HOSPITAL_COMMUNITY): Payer: BC Managed Care – PPO

## 2012-03-23 LAB — CBC
HCT: 39.1 % (ref 39.0–52.0)
Hemoglobin: 13 g/dL (ref 13.0–17.0)
MCH: 27.4 pg (ref 26.0–34.0)
MCHC: 33.2 g/dL (ref 30.0–36.0)
MCV: 82.5 fL (ref 78.0–100.0)
Platelets: 202 10*3/uL (ref 150–400)
RBC: 4.74 MIL/uL (ref 4.22–5.81)
RDW: 12.7 % (ref 11.5–15.5)
WBC: 6.8 10*3/uL (ref 4.0–10.5)

## 2012-03-23 LAB — URINALYSIS, ROUTINE W REFLEX MICROSCOPIC
Bilirubin Urine: NEGATIVE
Glucose, UA: NEGATIVE mg/dL
Hgb urine dipstick: NEGATIVE
Ketones, ur: NEGATIVE mg/dL
Leukocytes, UA: NEGATIVE
Nitrite: NEGATIVE
Protein, ur: NEGATIVE mg/dL
Specific Gravity, Urine: 1.026 (ref 1.005–1.030)
Urobilinogen, UA: 0.2 mg/dL (ref 0.0–1.0)
pH: 6.5 (ref 5.0–8.0)

## 2012-03-23 LAB — DIFFERENTIAL
Basophils Absolute: 0 10*3/uL (ref 0.0–0.1)
Basophils Relative: 1 % (ref 0–1)
Eosinophils Absolute: 0.2 10*3/uL (ref 0.0–0.7)
Eosinophils Relative: 3 % (ref 0–5)
Lymphocytes Relative: 40 % (ref 12–46)
Lymphs Abs: 2.3 10*3/uL (ref 0.7–4.0)
Monocytes Absolute: 0.5 10*3/uL (ref 0.1–1.0)
Monocytes Relative: 8 % (ref 3–12)
Neutro Abs: 2.9 10*3/uL (ref 1.7–7.7)
Neutrophils Relative %: 49 % (ref 43–77)

## 2012-03-23 LAB — COMPREHENSIVE METABOLIC PANEL
ALT: 14 U/L (ref 0–53)
AST: 18 U/L (ref 0–37)
Albumin: 3.7 g/dL (ref 3.5–5.2)
Alkaline Phosphatase: 55 U/L (ref 39–117)
BUN: 12 mg/dL (ref 6–23)
CO2: 27 mEq/L (ref 19–32)
Calcium: 8.8 mg/dL (ref 8.4–10.5)
Chloride: 102 mEq/L (ref 96–112)
Creatinine, Ser: 1.03 mg/dL (ref 0.50–1.35)
GFR calc Af Amer: >90 mL/min (ref >90)
GFR calc non Af Amer: 87 mL/min — ABNORMAL LOW (ref >90)
Glucose, Bld: 123 mg/dL — ABNORMAL HIGH (ref 70–99)
Potassium: 3.4 mEq/L — ABNORMAL LOW (ref 3.5–5.1)
Sodium: 138 mEq/L (ref 135–145)
Total Bilirubin: 0.2 mg/dL — ABNORMAL LOW (ref 0.3–1.2)
Total Protein: 6.6 g/dL (ref 6.0–8.3)

## 2012-03-23 LAB — TROPONIN I: Troponin I: <0.3 ng/mL (ref <0.30)

## 2012-03-23 MED ORDER — KETOROLAC TROMETHAMINE 30 MG/ML IJ SOLN
15.0000 mg | Freq: Once | INTRAMUSCULAR | Status: AC
  Administered 2012-03-23: 15 mg via INTRAVENOUS
  Filled 2012-03-23: qty 1

## 2012-03-23 MED ORDER — METOCLOPRAMIDE HCL 5 MG/ML IJ SOLN: 10.0000 mg | Freq: Once | INTRAMUSCULAR | Status: DC

## 2012-03-23 MED ORDER — DIPHENHYDRAMINE HCL 50 MG/ML IJ SOLN: 25.0000 mg | Freq: Once | INTRAMUSCULAR | Status: DC

## 2012-03-23 NOTE — ED Notes (Signed)
Patient is alert and oriented x3.  He was given DC instructions and follow up visit instructions.  Patient gave verbal understanding.  He was DC ambulatory under his own power to home.  V/S stable.  He was not showing any signs of distress on DC 

## 2012-03-23 NOTE — ED Notes (Signed)
D/c IV for d/c home 

## 2012-03-23 NOTE — ED Provider Notes (Signed)
History     CSN: [id number]  Arrival date & time 03/22/12  2258   First MD Initiated Contact with Patient 03/23/12 0007      Chief Complaint  Patient presents with  . Syncope     (Consider location/radiation/quality/duration/timing/severity/associated sxs/prior treatment) HPI This is a 41 year old black male who has had a three-day history of episodes of a flushing sensation from his lower chest upwards. He has had multiple episodes. There does not seem to be any specific trigger. They're associated with nausea and malaise. There is no associated focal neurologic deficit. They initially were not associated with a headache but have subsequently been associated with steadily worsening headache. He states his headache is moderate to severe at the present time. He still continues to be nauseated but has never vomited. He's had about a week of left lower quadrant abdominal pain which he does not relate to the symptoms. He came to the ED this evening because his latest episode was associated with about a 1 minute syncopal episode.  Past Medical History  Diagnosis Date  . GERD (gastroesophageal reflux disease)   . Kidney stones     Past Surgical History  Procedure Date  . Knee surgery     No family history on file.  History  Substance Use Topics  . Smoking status: Never Smoker   . Smokeless tobacco: Not on file  . Alcohol Use: No      Review of Systems  All other systems reviewed and are negative.    Allergies  Morphine and related; Antihistamines, diphenhydramine-type; Demerol; and Hydromorphone  Home Medications   Current Outpatient Rx  Name Route Sig Dispense Refill  . IBUPROFEN 200 MG PO TABS Oral Take 600 mg by mouth every 4 (four) hours as needed. pain      BP 122/81  Pulse 82  Temp 98.3 F (36.8 C)  Resp 20  SpO2 99%  Physical Exam General: Well-developed, well-nourished male in no acute distress; appearance consistent with age of record HENT:  normocephalic, atraumatic Eyes: pupils equal round and reactive to light; extraocular muscles intact Neck: supple Heart: regular rate and rhythm Lungs: clear to auscultation bilaterally Abdomen: soft; nondistended; left lower quadrant tenderness; no masses or hepatosplenomegaly; bowel sounds present Extremities: No deformity; full range of motion; pulses normal Neurologic: Awake, alert and oriented; motor function intact in all extremities and symmetric; no facial droop Skin: Warm and dry Psychiatric: Flat affect    ED Course  Procedures (including critical care time)     MDM   Nursing notes and vitals signs, including pulse oximetry, reviewed.  Summary of this visit's results, reviewed by myself:  Labs:  Results for orders placed during the hospital encounter of 03/22/12  GLUCOSE, CAPILLARY      Component Value Range   Glucose-Capillary 127 (*) 70 - 99 mg/dL   Comment 1 Documented in Chart    CBC      Component Value Range   WBC 6.8  4.0 - 10.5 K/uL   RBC 4.74  4.22 - 5.81 MIL/uL   Hemoglobin 13.0  13.0 - 17.0 g/dL   HCT 91.4  78.2 - 95.6 %   MCV 82.5  78.0 - 100.0 fL   MCH 27.4  26.0 - 34.0 pg   MCHC 33.2  30.0 - 36.0 g/dL   RDW 21.3  08.6 - 57.8 %   Platelets 202  150 - 400 K/uL  COMPREHENSIVE METABOLIC PANEL      Component Value Range  Sodium 138  135 - 145 mEq/L   Potassium 3.4 (*) 3.5 - 5.1 mEq/L   Chloride 102  96 - 112 mEq/L   CO2 27  19 - 32 mEq/L   Glucose, Bld 123 (*) 70 - 99 mg/dL   BUN 12  6 - 23 mg/dL   Creatinine, Ser 1.61  0.50 - 1.35 mg/dL   Calcium 8.8  8.4 - 09.6 mg/dL   Total Protein 6.6  6.0 - 8.3 g/dL   Albumin 3.7  3.5 - 5.2 g/dL   AST 18  0 - 37 U/L   ALT 14  0 - 53 U/L   Alkaline Phosphatase 55  39 - 117 U/L   Total Bilirubin 0.2 (*) 0.3 - 1.2 mg/dL   GFR calc non Af Amer 87 (*) >90 mL/min   GFR calc Af Amer >90  >90 mL/min  URINALYSIS, ROUTINE W REFLEX MICROSCOPIC      Component Value Range   Color, Urine YELLOW  YELLOW    APPearance CLOUDY (*) CLEAR   Specific Gravity, Urine 1.026  1.005 - 1.030   pH 6.5  5.0 - 8.0   Glucose, UA NEGATIVE  NEGATIVE mg/dL   Hgb urine dipstick NEGATIVE  NEGATIVE   Bilirubin Urine NEGATIVE  NEGATIVE   Ketones, ur NEGATIVE  NEGATIVE mg/dL   Protein, ur NEGATIVE  NEGATIVE mg/dL   Urobilinogen, UA 0.2  0.0 - 1.0 mg/dL   Nitrite NEGATIVE  NEGATIVE   Leukocytes, UA NEGATIVE  NEGATIVE  TROPONIN I      Component Value Range   Troponin I <0.30  <0.30 ng/mL  DIFFERENTIAL      Component Value Range   Neutrophils Relative 49  43 - 77 %   Neutro Abs 2.9  1.7 - 7.7 K/uL   Lymphocytes Relative 40  12 - 46 %   Lymphs Abs 2.3  0.7 - 4.0 K/uL   Monocytes Relative 8  3 - 12 %   Monocytes Absolute 0.5  0.1 - 1.0 K/uL   Eosinophils Relative 3  0 - 5 %   Eosinophils Absolute 0.2  0.0 - 0.7 K/uL   Basophils Relative 1  0 - 1 %   Basophils Absolute 0.0  0.0 - 0.1 K/uL    Imaging Studies: Ct Head Wo Contrast  03/23/2012  *RADIOLOGY REPORT*  Clinical Data: Headache and syncope.  CT HEAD WITHOUT CONTRAST  Technique:  Contiguous axial images were obtained from the base of the skull through the vertex without contrast.  Comparison: 10/17/2011  Findings: The ventricles and sulci are symmetrical without significant effacement, displacement, or dilatation. Cavum septum pellucidum.  No mass effect or midline shift. No abnormal extra- axial fluid collections. The grey-white matter junction is distinct. Basal cisterns are not effaced. No acute intracranial hemorrhage. No depressed skull fractures.  Visualized paranasal sinuses and mastoid air cells are not opacified.  No significant change since previous study.  IMPRESSION: No acute intracranial abnormalities.   Original Report Authenticated By: Marlon Pel, M.D.       EKG Interpretation:  Date & Time: 03/22/2012 11:25 PM  Rate: 73  Rhythm: normal sinus rhythm  QRS Axis: normal  Intervals: normal  ST/T Wave abnormalities: early  repolarization  Conduction Disutrbances:none  Narrative Interpretation:   Old EKG Reviewed: unchanged  2:38 AM Sleeping comfortably after Toradol 15 mg IV. Abdominal pain is now minimal.  3:13 AM Despite lack of diarrhea the patient's symptoms of flushing or concerning for carcinoid syndrome. We will  initiate an outpatient urine collection for 5 hydroxyindoleacetic acid acid and he will follow up with his primary care physician Dr. Tanya Nones. He was advised not to drive until he sees Dr. Tanya Nones as having a syncopal episode while driving that have undesirable results.        Hanley Seamen, MD 03/23/12 [phone]

## 2012-09-10 ENCOUNTER — Emergency Department (HOSPITAL_COMMUNITY): Payer: BC Managed Care – PPO

## 2012-09-10 ENCOUNTER — Encounter (HOSPITAL_COMMUNITY): Payer: Self-pay | Admitting: Emergency Medicine

## 2012-09-10 ENCOUNTER — Emergency Department (HOSPITAL_COMMUNITY)
Admission: EM | Admit: 2012-09-10 | Discharge: 2012-09-10 | Disposition: A | Discharge status: Home / Self Care | Payer: BC Managed Care – PPO | Attending: Emergency Medicine | Admitting: Emergency Medicine

## 2012-09-10 DIAGNOSIS — Z7982 Long term (current) use of aspirin: Secondary | ICD-10-CM | POA: Insufficient documentation

## 2012-09-10 DIAGNOSIS — Z87442 Personal history of urinary calculi: Secondary | ICD-10-CM | POA: Insufficient documentation

## 2012-09-10 DIAGNOSIS — J029 Acute pharyngitis, unspecified: Secondary | ICD-10-CM | POA: Insufficient documentation

## 2012-09-10 DIAGNOSIS — Z8719 Personal history of other diseases of the digestive system: Secondary | ICD-10-CM | POA: Insufficient documentation

## 2012-09-10 DIAGNOSIS — B338 Other specified viral diseases: Secondary | ICD-10-CM | POA: Insufficient documentation

## 2012-09-10 DIAGNOSIS — R0602 Shortness of breath: Secondary | ICD-10-CM | POA: Insufficient documentation

## 2012-09-10 DIAGNOSIS — J069 Acute upper respiratory infection, unspecified: Secondary | ICD-10-CM | POA: Insufficient documentation

## 2012-09-10 DIAGNOSIS — R079 Chest pain, unspecified: Secondary | ICD-10-CM

## 2012-09-10 DIAGNOSIS — R0789 Other chest pain: Secondary | ICD-10-CM | POA: Insufficient documentation

## 2012-09-10 LAB — BASIC METABOLIC PANEL
BUN: 9 mg/dL (ref 6–23)
CO2: 28 mEq/L (ref 19–32)
Calcium: 8.8 mg/dL (ref 8.4–10.5)
Chloride: 104 mEq/L (ref 96–112)
Creatinine, Ser: 0.97 mg/dL (ref 0.50–1.35)
GFR calc Af Amer: >90 mL/min (ref >90)
GFR calc non Af Amer: >90 mL/min (ref >90)
Glucose, Bld: 108 mg/dL — ABNORMAL HIGH (ref 70–99)
Potassium: 3.7 mEq/L (ref 3.5–5.1)
Sodium: 138 mEq/L (ref 135–145)

## 2012-09-10 LAB — POCT I-STAT TROPONIN I
Troponin i, poc: 0 ng/mL (ref 0.00–0.08)
Troponin i, poc: 0 ng/mL (ref 0.00–0.08)

## 2012-09-10 LAB — CBC
HCT: 39.1 % (ref 39.0–52.0)
Hemoglobin: 13.3 g/dL (ref 13.0–17.0)
MCH: 27.8 pg (ref 26.0–34.0)
MCHC: 34 g/dL (ref 30.0–36.0)
MCV: 81.8 fL (ref 78.0–100.0)
Platelets: 160 10*3/uL (ref 150–400)
RBC: 4.78 MIL/uL (ref 4.22–5.81)
RDW: 12.8 % (ref 11.5–15.5)
WBC: 8.1 10*3/uL (ref 4.0–10.5)

## 2012-09-10 LAB — D-DIMER, QUANTITATIVE (NOT AT ARMC): D-Dimer, Quant: <0.27 ug/mL-FEU (ref 0.00–0.48)

## 2012-09-10 LAB — PRO B NATRIURETIC PEPTIDE: Pro B Natriuretic peptide (BNP): <5 pg/mL (ref 0–125)

## 2012-09-10 MED ORDER — KETOROLAC TROMETHAMINE 30 MG/ML IJ SOLN
30.0000 mg | Freq: Once | INTRAMUSCULAR | Status: AC
  Administered 2012-09-10: 30 mg via INTRAVENOUS
  Filled 2012-09-10: qty 1

## 2012-09-10 MED ORDER — FLUTICASONE PROPIONATE 50 MCG/ACT NA SUSP: 2.0000 | Freq: Every day | NASAL | Status: DC

## 2012-09-10 MED ORDER — MORPHINE SULFATE 4 MG/ML IJ SOLN: 6.0000 mg | Freq: Once | INTRAMUSCULAR | Status: DC

## 2012-09-10 MED ORDER — ASPIRIN 325 MG PO TABS
325.0000 mg | ORAL_TABLET | Freq: Every day | ORAL | Status: DC
  Administered 2012-09-10: 325 mg via ORAL
  Filled 2012-09-10: qty 1

## 2012-09-10 NOTE — ED Provider Notes (Signed)
Complains of exertional dyspnea and chest pain pleuritic in nature worse with exertion onset today while walking. No treatment prior to coming here. Also complains of sore throat. Patient reports he had nuclear medicine stress test approximately a month ago which was normal. Cannot recall the cardiologist. On exam patient is alert Glasgow Coma Score of lungs the patient heart regular rate and rhythm chest is tender anteriorly, reproducing pain exactly. Abdomen nontender. Extremities without edema  Date: 09/10/2012  Rate: 85  Rhythm: normal sinus rhythm  QRS Axis: normal  Intervals: normal  ST/T Wave abnormalities: normal  Conduction Disutrbances: none  Narrative Interpretation: unremarkable No change from 03/22/12 5:40 PM pain improved after treatment with intravenous Toradol Spoke with Dr.Gangi. Patient had negative nuclear medicine exercise stress test on 05/29/2012 with ejection fraction of 52%. plan obtained d-dimer is positive patient should receive a CT angiogram of chest rule out pulmonary embolism if negative the patient has 2 sets of negative cardiac markers as he will be seen as an outpatient in the office of Dr. Nadara Eaton 09/12/12 Pt signed out to Dr Silverio Lay 540 pm  Doug Sou, MD 09/10/12 1742

## 2012-09-10 NOTE — ED Provider Notes (Signed)
History     CSN: [id number]  Arrival date & time 09/10/12  1411   First MD Initiated Contact with Patient 09/10/12 1423      Chief Complaint  Patient presents with  . Chest Pain    (Consider location/radiation/quality/duration/timing/severity/associated sxs/prior treatment) HPI Comments: Pt presents to the ED for exertional chest pain and SOB since yesterday afternoon.  States he was working and began experiencing some chest tightness and sweating.  Went to rest and felt somewhat better.  Later on he was playing with his daughter and starting feeling SOB again.  Notes that he was up all night and felt as though he was having trouble breathing.  Also notes a sore throat with some painful swallowing.  Hx of GERD but is not currently on medications.  Reports he was previously evaluated by cardiology, Dr. Nadara Eaton- negative echo in 2011.  GERD previously evaluated by Dr. Elnoria Howard with endoscopy.  Denies any dizziness, weakness, abdominal pain, nausea, vomiting, or diarrhea.  No trouble swallowing.  O2 sats 99% on room air.  The history is provided by the patient.    Past Medical History  Diagnosis Date  . GERD (gastroesophageal reflux disease)   . Kidney stones     Past Surgical History  Procedure Laterality Date  . Knee surgery      History reviewed. No pertinent family history.  History  Substance Use Topics  . Smoking status: Never Smoker   . Smokeless tobacco: Not on file  . Alcohol Use: No      Review of Systems  Respiratory: Positive for chest tightness and shortness of breath.   All other systems reviewed and are negative.    Allergies  Morphine and related; Antihistamines, diphenhydramine-type; Demerol; and Hydromorphone  Home Medications   Current Outpatient Rx  Name  Route  Sig  Dispense  Refill  . ibuprofen (ADVIL,MOTRIN) 200 MG tablet   Oral   Take 600 mg by mouth every 4 (four) hours as needed. pain           BP 124/66  Pulse 86  Temp(Src) 98.8 F  (37.1 C) (Oral)  Resp 15  SpO2 99%  Physical Exam  Nursing note and vitals reviewed. Constitutional: He is oriented to person, place, and time. He appears well-developed and well-nourished.  HENT:  Head: Normocephalic and atraumatic. No trismus in the jaw.  Mouth/Throat: Uvula is midline and mucous membranes are normal. No oral lesions. No edematous. No oropharyngeal exudate, posterior oropharyngeal edema, posterior oropharyngeal erythema or tonsillar abscesses.  Eyes: Conjunctivae and EOM are normal. Pupils are equal, round, and reactive to light.  Neck: Normal range of motion. Neck supple.  Cardiovascular: Normal rate, regular rhythm and normal heart sounds.   Pulmonary/Chest: Effort normal and breath sounds normal. He has no wheezes.  Chest pain reproducible with palpation to anterior chest wall  Abdominal: Soft. Bowel sounds are normal. There is no tenderness. There is no guarding.  Musculoskeletal: Normal range of motion. He exhibits no edema.  Neurological: He is alert and oriented to person, place, and time. He has normal strength. No cranial nerve deficit or sensory deficit.  Skin: Skin is warm and dry.  Psychiatric: He has a normal mood and affect.    ED Course  Procedures (including critical care time)   Date: 09/10/2012  Rate: 85  Rhythm: normal sinus rhythm  QRS Axis: normal  Intervals: normal  ST/T Wave abnormalities: normal  Conduction Disutrbances:none  Narrative Interpretation:   Old EKG Reviewed: unchanged  Labs Reviewed  BASIC METABOLIC PANEL - Abnormal; Notable for the following:    Glucose, Bld 108 (*)    All other components within normal limits  CBC  PRO B NATRIURETIC PEPTIDE  D-DIMER, QUANTITATIVE  POCT I-STAT TROPONIN I  POCT I-STAT TROPONIN I   Dg Chest 2 View  09/10/2012  *RADIOLOGY REPORT*  Clinical Data:  chest pain  CHEST - 2 VIEW  Comparison: 10/17/2011  Findings: The heart size and mediastinal contours are within normal limits.  Both  lungs are clear.  The visualized skeletal structures are unremarkable.  IMPRESSION: Negative exam.   Original Report Authenticated By: Signa Kell, M.D.      1. Chest pain   2. Viral URI       MDM   Pt presenting to the ED with exertional chest tightness and SOB.  Also notes some painful swallowing.  Hx of GERD, not currently on medication.  Cardiac work up negative thus far.  Low suspicion that chest pain is cardiac nature. Will obtain second troponin at 6pm.  If negative, pt will be d/c and FU with cardiology- Dr. Nadara Eaton.  Signed out to [employer], PA-C for continuation of care and disposition.        Garlon Hatchet, PA-C 09/10/12 1912

## 2012-09-10 NOTE — ED Provider Notes (Signed)
Medical screening examination/treatment/procedure(s) were performed by non-physician practitioner and as supervising physician I was immediately available for consultation/collaboration.   Suetta Hoffmeister H Makira Holleman, MD 09/10/12 2256 

## 2012-09-10 NOTE — ED Notes (Signed)
Pt reports chest pain started yesterday along with shortness of breathe while walking.

## 2012-09-10 NOTE — ED Provider Notes (Signed)
  Danny Brewer is a 42 y.o. male presents with Chest pain, exertional since yestserday with SOB relieved with rest.  Pain is reproducible.  Fist set of labs and imaging WNL.  Cardiologist Dr Jacinto Halim.  Negative Echo in 2011.  Pt discussed with Dr Jacinto Halim who confirms the plan for delta trop and then outpatient f/u.  Plan: Obtain 2nd troponin at 6pm and d-dimer and if negative will D/C with cardiology f/u on Monday.   5:44 PM - d-dimer negative, await delta troponin.    6:32 PM - delta troponin negative, we'll discharge home with cardiology followup with Dr. Jacinto Halim.    Chest pain is not likely of cardiac or pulmonary etiology d/t presentation, VSS, no tracheal deviation, no JVD or new murmur, RRR, breath sounds equal bilaterally, EKG without acute abnormalities, negative troponin and delta troponin, negative d-dimer, and negative CXR. Pt has been advised to return to the ED if CP becomes exertional, associated with diaphoresis or nausea, radiates to left jaw/arm, worsens or becomes concerning in any way. Pt appears reliable for follow up with Dr Jacinto Halim and is agreeable to discharge.     Dierdre Forth, PA-C 09/10/12 1836  Patient also complaining of URI symptoms for 2 days including sinus congestion postnasal drip and sore throat. Patient states symptoms are worse at night and he feels as if he can't breathe because of the postnasal drip. I recommended using a pill to sleep with, taking Mucinex and will try on a nasal steroid. Patient is to followup with his primary care physician regarding the symptoms on Monday.  Physical exam of his throat shows nonerythematous, nonedematous posterior pharynx without exudate or abscess ulcers. No concern for strep or other bacterial pharyngitis.  Dahlia Client Erving Sassano, PA-C 09/10/12 1856

## 2012-09-11 NOTE — ED Provider Notes (Signed)
Medical screening examination/treatment/procedure(s) were conducted as a shared visit with non-physician practitioner(s) and myself.  I personally evaluated the patient during the encounter  Doug Sou, MD 09/11/12 [phone]

## 2012-09-21 ENCOUNTER — Telehealth: Payer: Self-pay | Admitting: Family Medicine

## 2012-09-21 NOTE — Telephone Encounter (Signed)
Questions answered.

## 2012-09-22 NOTE — Telephone Encounter (Signed)
No you do not need to call him.Marland KitchenMarland KitchenI talked to him and has an appt on 4/25 to come in and talk to you about his groin pain he has been having.

## 2012-09-22 NOTE — Telephone Encounter (Signed)
Do i need to call him?

## 2012-09-30 ENCOUNTER — Inpatient Hospital Stay: Payer: Self-pay | Admitting: Family Medicine

## 2012-10-07 ENCOUNTER — Inpatient Hospital Stay: Payer: Self-pay | Admitting: Family Medicine

## 2012-10-14 ENCOUNTER — Inpatient Hospital Stay: Payer: Self-pay | Admitting: Family Medicine

## 2012-10-19 ENCOUNTER — Inpatient Hospital Stay: Payer: Self-pay | Admitting: Family Medicine

## 2012-11-11 ENCOUNTER — Ambulatory Visit: Payer: BC Managed Care – PPO | Admitting: Family Medicine

## 2012-11-15 ENCOUNTER — Encounter: Payer: Self-pay | Admitting: Family Medicine

## 2012-11-15 ENCOUNTER — Ambulatory Visit (INDEPENDENT_AMBULATORY_CARE_PROVIDER_SITE_OTHER): Payer: BC Managed Care – PPO | Admitting: Family Medicine

## 2012-11-15 VITALS — BP 100/60 | HR 80 | Temp 98.4°F | Resp 16 | Wt 185.0 lb

## 2012-11-15 DIAGNOSIS — S39011A Strain of muscle, fascia and tendon of abdomen, initial encounter: Secondary | ICD-10-CM

## 2012-11-15 DIAGNOSIS — IMO0002 Reserved for concepts with insufficient information to code with codable children: Secondary | ICD-10-CM

## 2012-11-15 DIAGNOSIS — N529 Male erectile dysfunction, unspecified: Secondary | ICD-10-CM

## 2012-11-15 MED ORDER — TADALAFIL 20 MG PO TABS
10.0000 mg | ORAL_TABLET | ORAL | Status: DC | PRN
Start: 2012-11-15 — End: 2012-12-02

## 2012-11-15 NOTE — Progress Notes (Signed)
  Subjective:    Patient ID: Danny Brewer, male    DOB: 1970/11/12, 42 y.o.   MRN: [id number]  HPI Patient complains of 2 weeks of lower abdominal pain. It hurts when he bends. Her 20 less heavy objects. 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 He thinks he may have strained a muscle in his lower abdomen. He denies any hernias. He does not have any bulges in his inguinal canal or in the scrotum. The nausea vomiting or diarrhea. The pain is muscular in nature and superficial. Bursa to sit reports erectile dysfunction. He denies any problems with libido. I prescribed Viagra last year but he never filled the prescription.  He states it does not happen with every instance of sexual activity. However the erections are not as hard as they used to be and he frequently loses the erection in the middle of sexual intercourse. Past Medical History  Diagnosis Date  . GERD (gastroesophageal reflux disease)   . Kidney stones    No current outpatient prescriptions on file prior to visit.   No current facility-administered medications on file prior to visit.   Allergies  Allergen Reactions  . Morphine And Related Itching  . Antihistamines, Diphenhydramine-Type Hives  . Demerol Itching  . Hydromorphone     Causes itching and hives with AMS.    History   Social History  . Marital Status: Married    Spouse Name: N/A    Number of Children: N/A  . Years of Education: N/A   Occupational History  . Not on file.   Social History Main Topics  . Smoking status: Never Smoker   . Smokeless tobacco: Not on file  . Alcohol Use: No  . Drug Use: No  . Sexually Active:    Other Topics Concern  . Not on file   Social History Narrative  . No narrative on file      Review of Systems  All other systems reviewed and are negative.       Objective:   Physical Exam  Cardiovascular: Normal rate, regular rhythm and normal heart sounds.  Exam reveals no gallop and no friction rub.   No murmur  heard. Pulmonary/Chest: Breath sounds normal. No respiratory distress. He has no wheezes. He has no rales. He exhibits no tenderness.  Abdominal: Soft. Bowel sounds are normal. He exhibits no distension and no mass. There is no tenderness. There is no rebound and no guarding.  Genitourinary: Penis normal. No penile tenderness.   testicular exam is normal. There is no inguinal hernia. There is no direct ventral hernia.        Assessment & Plan:  1. Abdominal muscle strain, initial encounter I recommended over-the-counter ibuprofen 800 mg every 8 hours when necessary muscle pain. I recommended warm compresses to the area. I recommended tincture of time. If the pain has not improved in one to 2 weeks he is to call me. Return immediately if worsening or changing.  2. Erectile dysfunction Cialis 20 mg, take one half to one pill by mouth daily when necessary for sexual intercourse

## 2012-12-02 ENCOUNTER — Ambulatory Visit
Admission: RE | Admit: 2012-12-02 | Discharge: 2012-12-02 | Disposition: A | Discharge status: Home / Self Care | Payer: BC Managed Care – PPO | Source: Ambulatory Visit | Attending: Family Medicine | Admitting: Family Medicine

## 2012-12-02 ENCOUNTER — Ambulatory Visit (INDEPENDENT_AMBULATORY_CARE_PROVIDER_SITE_OTHER): Payer: BC Managed Care – PPO | Admitting: Family Medicine

## 2012-12-02 ENCOUNTER — Encounter: Payer: Self-pay | Admitting: Family Medicine

## 2012-12-02 VITALS — BP 120/90 | HR 78 | Temp 98.3°F | Resp 16 | Wt 183.0 lb

## 2012-12-02 DIAGNOSIS — R091 Pleurisy: Secondary | ICD-10-CM

## 2012-12-02 MED ORDER — MELOXICAM 15 MG PO TABS: 15.0000 mg | ORAL_TABLET | Freq: Every day | ORAL | Status: DC

## 2012-12-02 NOTE — Progress Notes (Signed)
  Subjective:    Patient ID: Danny Brewer, male    DOB: [birth year], 42 y.o.   MRN: [id number]  HPI  Patient reports 4 days of pain between her shoulder blades. It hurts when he takes a deep breath in. Hurts to move the shoulders. The pain is minimal.  Denies any chest pain or angina. He denies any dyspnea on exertion. He denies any cough or shortness of breath.  He denies any hemoptysis.    Past Medical History  Diagnosis Date  . GERD (gastroesophageal reflux disease)   . Kidney stones    No current outpatient prescriptions on file prior to visit.   No current facility-administered medications on file prior to visit.   Allergies  Allergen Reactions  . Morphine And Related Itching  . Antihistamines, Diphenhydramine-Type Hives  . Demerol Itching  . Hydromorphone     Causes itching and hives with AMS.    History   Social History  . Marital Status: Married    Spouse Name: N/A    Number of Children: N/A  . Years of Education: N/A   Occupational History  . Not on file.   Social History Main Topics  . Smoking status: Never Smoker   . Smokeless tobacco: Not on file  . Alcohol Use: No  . Drug Use: No  . Sexually Active:    Other Topics Concern  . Not on file   Social History Narrative  . No narrative on file     Review of Systems  All other systems reviewed and are negative.       Objective:   Physical Exam  Vitals reviewed. Cardiovascular: Normal rate, regular rhythm and normal heart sounds.   No murmur heard. Pulmonary/Chest: Effort normal and breath sounds normal. No respiratory distress. He has no wheezes. He has no rales. He exhibits tenderness.   I am able to reproduce the patient's pain by palpating the thoracic paraspinal musculature between his shoulder blades.          Assessment & Plan:  1. Pleurisy I believe the patient has pulled a muscle in either his rib cage or around the shoulder blades.  Get a chest x-ray to be thorough to rule out  pulmonary pathology. The patient's symptoms do not sound like an aortic dissection. Begin Mobic 15 mg by mouth daily for muscle pull. - meloxicam (MOBIC) 15 MG tablet; Take 1 tablet (15 mg total) by mouth daily.  Dispense: 30 tablet; Refill: 0 - DG Chest 2 View; Future

## 2013-02-03 ENCOUNTER — Ambulatory Visit: Payer: BC Managed Care – PPO | Admitting: Family Medicine

## 2013-02-17 ENCOUNTER — Encounter: Payer: Self-pay | Admitting: Family Medicine

## 2013-02-17 ENCOUNTER — Ambulatory Visit (INDEPENDENT_AMBULATORY_CARE_PROVIDER_SITE_OTHER): Payer: BC Managed Care – PPO | Admitting: Family Medicine

## 2013-02-17 VITALS — BP 110/74 | HR 80 | Temp 98.3°F | Resp 14 | Ht 69.0 in | Wt 184.0 lb

## 2013-02-17 DIAGNOSIS — Z13220 Encounter for screening for lipoid disorders: Secondary | ICD-10-CM

## 2013-02-17 LAB — COMPLETE METABOLIC PANEL WITH GFR
ALT: 14 U/L (ref 0–53)
AST: 15 U/L (ref 0–37)
Albumin: 4.1 g/dL (ref 3.5–5.2)
Alkaline Phosphatase: 52 U/L (ref 39–117)
BUN: 16 mg/dL (ref 6–23)
CO2: 24 mEq/L (ref 19–32)
Calcium: 8.9 mg/dL (ref 8.4–10.5)
Chloride: 105 mEq/L (ref 96–112)
Creat: 1.1 mg/dL (ref 0.50–1.35)
GFR, Est African American: >89 mL/min
GFR, Est Non African American: 82 mL/min
Glucose, Bld: 99 mg/dL (ref 70–99)
Potassium: 4.2 mEq/L (ref 3.5–5.3)
Sodium: 136 mEq/L (ref 135–145)
Total Bilirubin: 0.3 mg/dL (ref 0.3–1.2)
Total Protein: 6.8 g/dL (ref 6.0–8.3)

## 2013-02-17 LAB — LIPID PANEL
Cholesterol: 189 mg/dL (ref 0–200)
HDL: 54 mg/dL (ref >39)
LDL Cholesterol: 124 mg/dL — ABNORMAL HIGH (ref 0–99)
Total CHOL/HDL Ratio: 3.5 Ratio
Triglycerides: 57 mg/dL (ref <150)
VLDL: 11 mg/dL (ref 0–40)

## 2013-02-17 NOTE — Progress Notes (Signed)
Subjective:    Patient ID: Danny Brewer, male    DOB: 06/09/1970, 42 y.o.   MRN: [id number]  HPI Patient is here today for followup. The pleurisy that he was having at his last office visit has subsided. He also like his cholesterol checked. He denies any further chest pain. He does have occasional heartburn and reflux but is not taking any medication for them. He's also asking that a draft a letter for the [employer]. He states that they will give him special consideration financially if I showed evidence that he missed substantial work due to his medical problems from 2008 to present.  He has been to the hospital and office several times for abdominal discomfort and chest pain.  Workup has been negative to date. Anxiety was felt to be a large component to his somatic complaints. I will be glad to draft a letter stating such a long dates of treatment.  Otherwise he is doing well. Past Medical History  Diagnosis Date  . GERD (gastroesophageal reflux disease)   . Kidney stones    No current outpatient prescriptions on file prior to visit.   No current facility-administered medications on file prior to visit.   Allergies  Allergen Reactions  . Morphine And Related Itching  . Antihistamines, Diphenhydramine-Type Hives  . Demerol Itching  . Hydromorphone     Causes itching and hives with AMS.    Past Surgical History  Procedure Laterality Date  . Knee surgery     History   Social History  . Marital Status: Married    Spouse Name: N/A    Number of Children: N/A  . Years of Education: N/A   Occupational History  . Not on file.   Social History Main Topics  . Smoking status: Never Smoker   . Smokeless tobacco: Not on file  . Alcohol Use: No  . Drug Use: No  . Sexual Activity:    Other Topics Concern  . Not on file   Social History Narrative  . No narrative on file      Review of Systems  All other systems reviewed and are negative.       Objective:   Physical Exam  Vitals reviewed. Constitutional: He is oriented to person, place, and time. He appears well-developed and well-nourished. No distress.  HENT:  Nose: Nose normal.  Mouth/Throat: Oropharynx is clear and moist. No oropharyngeal exudate.  Eyes: Conjunctivae and EOM are normal. Pupils are equal, round, and reactive to light.  Neck: Neck supple. No JVD present. No thyromegaly present.  Cardiovascular: Normal rate, regular rhythm, normal heart sounds and intact distal pulses.  Exam reveals no gallop and no friction rub.   No murmur heard. Pulmonary/Chest: Effort normal and breath sounds normal. No respiratory distress. He has no wheezes. He has no rales. He exhibits no tenderness.  Abdominal: Soft. Bowel sounds are normal. He exhibits no distension and no mass. There is no tenderness. There is no rebound and no guarding.  Lymphadenopathy:    He has no cervical adenopathy.  Neurological: He is alert and oriented to person, place, and time. He has normal reflexes. He displays normal reflexes. No cranial nerve deficit. Coordination normal.  Skin: He is not diaphoretic.          Assessment & Plan:  1. Screening cholesterol level Patient's physical exam and blood pressure are normal today. I offered the patient flu shot but he declined it. Check a CMP and a fasting lipid panel. His goal  LDL is less than 130. I want to draft a letter discussing his dates of hospitalization as well as his dates of service in our clinic for him to take to the [employer]. - COMPLETE METABOLIC PANEL WITH GFR - Lipid panel

## 2013-03-28 ENCOUNTER — Encounter: Payer: Self-pay | Admitting: Family Medicine

## 2013-04-13 ENCOUNTER — Other Ambulatory Visit: Payer: Self-pay

## 2013-04-14 ENCOUNTER — Ambulatory Visit (INDEPENDENT_AMBULATORY_CARE_PROVIDER_SITE_OTHER): Payer: BC Managed Care – PPO | Admitting: Family Medicine

## 2013-04-14 ENCOUNTER — Encounter: Payer: Self-pay | Admitting: Family Medicine

## 2013-04-14 ENCOUNTER — Ambulatory Visit: Payer: BC Managed Care – PPO | Admitting: Family Medicine

## 2013-04-14 VITALS — BP 110/70 | HR 92 | Temp 98.8°F | Resp 16 | Wt 188.0 lb

## 2013-04-14 DIAGNOSIS — R040 Epistaxis: Secondary | ICD-10-CM

## 2013-04-14 NOTE — Progress Notes (Signed)
  Subjective:    Patient ID: Danny Brewer, male    DOB: February 08, 1971, 42 y.o.   MRN: [id number]  HPI Patient reports 2 days of epistaxis and sinus irritation. He denies any sinus pain, fevers, headache. He complains of itching deep into his nares.  He also had some questions regarding the paperwork for his housing.  Past Medical History  Diagnosis Date  . GERD (gastroesophageal reflux disease)   . Kidney stones    No current outpatient prescriptions on file prior to visit.   No current facility-administered medications on file prior to visit.   Allergies  Allergen Reactions  . Morphine And Related Itching  . Antihistamines, Diphenhydramine-Type Hives  . Demerol Itching  . Hydromorphone     Causes itching and hives with AMS.    History   Social History  . Marital Status: Married    Spouse Name: N/A    Number of Children: N/A  . Years of Education: N/A   Occupational History  . Not on file.   Social History Main Topics  . Smoking status: Never Smoker   . Smokeless tobacco: Not on file  . Alcohol Use: No  . Drug Use: No  . Sexual Activity:    Other Topics Concern  . Not on file   Social History Narrative  . No narrative on file      Review of Systems  All other systems reviewed and are negative.       Objective:   Physical Exam  Vitals reviewed. Constitutional: He appears well-developed and well-nourished.  HENT:  Right Ear: External ear normal.  Left Ear: External ear normal.  Nose: Nose normal.  Mouth/Throat: Oropharynx is clear and moist. No oropharyngeal exudate.  Eyes: Conjunctivae are normal. No scleral icterus.  Cardiovascular: Normal rate and regular rhythm.   Pulmonary/Chest: Effort normal and breath sounds normal.  Lymphadenopathy:    He has no cervical adenopathy.          Assessment & Plan:  1. Epistaxis Exam is normal. I recommended sleeping next to a humidifier and placing Vaseline in his nodules at night to try to help moisten the  mucosa to help prevent epistaxis. I do not feel the patient has a sinus infection. Regarding his paperwork, the patient wanted me to put a defined period of time that he missed work after each office visit. I had recently written a letter detailing each office visit where he had been seen in the last few years. I informed the patient that I cannot write a defined period of time to be absent from work after each office visit because most office visits did not require a lengthy absence.

## 2013-05-20 ENCOUNTER — Emergency Department (HOSPITAL_COMMUNITY): Payer: BC Managed Care – PPO

## 2013-05-20 ENCOUNTER — Encounter (HOSPITAL_COMMUNITY): Payer: Self-pay | Admitting: Emergency Medicine

## 2013-05-20 ENCOUNTER — Emergency Department (HOSPITAL_COMMUNITY)
Admission: EM | Admit: 2013-05-20 | Discharge: 2013-05-20 | Disposition: A | Discharge status: Home / Self Care | Payer: BC Managed Care – PPO | Attending: Emergency Medicine | Admitting: Emergency Medicine

## 2013-05-20 DIAGNOSIS — R51 Headache: Secondary | ICD-10-CM | POA: Insufficient documentation

## 2013-05-20 DIAGNOSIS — Z888 Allergy status to other drugs, medicaments and biological substances status: Secondary | ICD-10-CM | POA: Insufficient documentation

## 2013-05-20 DIAGNOSIS — R5381 Other malaise: Secondary | ICD-10-CM | POA: Insufficient documentation

## 2013-05-20 DIAGNOSIS — R059 Cough, unspecified: Secondary | ICD-10-CM | POA: Insufficient documentation

## 2013-05-20 DIAGNOSIS — Z885 Allergy status to narcotic agent status: Secondary | ICD-10-CM | POA: Insufficient documentation

## 2013-05-20 DIAGNOSIS — K219 Gastro-esophageal reflux disease without esophagitis: Secondary | ICD-10-CM | POA: Insufficient documentation

## 2013-05-20 DIAGNOSIS — J3489 Other specified disorders of nose and nasal sinuses: Secondary | ICD-10-CM | POA: Insufficient documentation

## 2013-05-20 DIAGNOSIS — Z87442 Personal history of urinary calculi: Secondary | ICD-10-CM | POA: Insufficient documentation

## 2013-05-20 DIAGNOSIS — R05 Cough: Secondary | ICD-10-CM | POA: Insufficient documentation

## 2013-05-20 DIAGNOSIS — J111 Influenza due to unidentified influenza virus with other respiratory manifestations: Secondary | ICD-10-CM

## 2013-05-20 DIAGNOSIS — R52 Pain, unspecified: Secondary | ICD-10-CM | POA: Insufficient documentation

## 2013-05-20 LAB — CBC WITH DIFFERENTIAL/PLATELET
Basophils Absolute: 0 10*3/uL (ref 0.0–0.1)
Basophils Relative: 0 % (ref 0–1)
Eosinophils Absolute: 0.2 10*3/uL (ref 0.0–0.7)
Eosinophils Relative: 3 % (ref 0–5)
HCT: 40.8 % (ref 39.0–52.0)
Hemoglobin: 13.4 g/dL (ref 13.0–17.0)
Lymphocytes Relative: 31 % (ref 12–46)
Lymphs Abs: 2.2 10*3/uL (ref 0.7–4.0)
MCH: 27.7 pg (ref 26.0–34.0)
MCHC: 32.8 g/dL (ref 30.0–36.0)
MCV: 84.3 fL (ref 78.0–100.0)
Monocytes Absolute: 0.5 10*3/uL (ref 0.1–1.0)
Monocytes Relative: 8 % (ref 3–12)
Neutro Abs: 4 10*3/uL (ref 1.7–7.7)
Neutrophils Relative %: 58 % (ref 43–77)
Platelets: 197 10*3/uL (ref 150–400)
RBC: 4.84 MIL/uL (ref 4.22–5.81)
RDW: 13 % (ref 11.5–15.5)
WBC: 6.9 10*3/uL (ref 4.0–10.5)

## 2013-05-20 LAB — COMPREHENSIVE METABOLIC PANEL
ALT: 14 U/L (ref 0–53)
AST: 16 U/L (ref 0–37)
Albumin: 3.7 g/dL (ref 3.5–5.2)
Alkaline Phosphatase: 60 U/L (ref 39–117)
BUN: 12 mg/dL (ref 6–23)
CO2: 27 mEq/L (ref 19–32)
Calcium: 8.6 mg/dL (ref 8.4–10.5)
Chloride: 107 mEq/L (ref 96–112)
Creatinine, Ser: 0.92 mg/dL (ref 0.50–1.35)
GFR calc Af Amer: >90 mL/min (ref >90)
GFR calc non Af Amer: >90 mL/min (ref >90)
Glucose, Bld: 98 mg/dL (ref 70–99)
Potassium: 4 mEq/L (ref 3.5–5.1)
Sodium: 141 mEq/L (ref 135–145)
Total Bilirubin: 0.2 mg/dL — ABNORMAL LOW (ref 0.3–1.2)
Total Protein: 6.9 g/dL (ref 6.0–8.3)

## 2013-05-20 LAB — LIPASE, BLOOD: Lipase: 13 U/L (ref 11–59)

## 2013-05-20 MED ORDER — SODIUM CHLORIDE 0.9 % IV BOLUS (SEPSIS): 1000.0000 mL | Freq: Once | INTRAVENOUS | Status: DC

## 2013-05-20 MED ORDER — SODIUM CHLORIDE 0.9 % IV BOLUS (SEPSIS)
1000.0000 mL | Freq: Once | INTRAVENOUS | Status: AC
  Administered 2013-05-20: 1000 mL via INTRAVENOUS

## 2013-05-20 MED ORDER — PHENYLEPH-PROMETHAZINE-COD 5-6.25-10 MG/5ML PO SYRP: 5.0000 mL | ORAL_SOLUTION | Freq: Two times a day (BID) | ORAL | Status: DC | PRN

## 2013-05-20 MED ORDER — ONDANSETRON HCL 4 MG/2ML IJ SOLN
4.0000 mg | Freq: Once | INTRAMUSCULAR | Status: AC
  Administered 2013-05-20: 4 mg via INTRAVENOUS
  Filled 2013-05-20: qty 2

## 2013-05-20 NOTE — ED Notes (Addendum)
Pt from home with c/o chest congestion, cough, fever, headache, wear, and body aches x6 days.  Pt treating at home with OTC medications but symptoms are not improving.  Pt in NAD, A&O

## 2013-05-20 NOTE — ED Notes (Signed)
Patient transported to X-ray 

## 2013-05-20 NOTE — ED Provider Notes (Signed)
CSN: [id number]     Arrival date & time 05/20/13  1478 History   First MD Initiated Contact with Patient 05/20/13 [phone]     Chief Complaint  Patient presents with  . Fatigue  . Headache  . Nasal Congestion  . Cough   (Consider location/radiation/quality/duration/timing/severity/associated sxs/prior Treatment) HPI Patient presents with concerns of cough, congestion, generalized discomfort.  On symptoms began approximately one week ago without precipitant.  Since onset symptoms have been progressive.  X7 patient has had episodes of diarrhea, fever, abdominal discomfort. Currently he has no nausea, vomiting, diarrhea, fever. He has had minimal relief with OTC medication. He does not smoke, does not drink, denies sick contacts.    Past Medical History  Diagnosis Date  . GERD (gastroesophageal reflux disease)   . Kidney stones    Past Surgical History  Procedure Laterality Date  . Knee surgery     History reviewed. No pertinent family history. History  Substance Use Topics  . Smoking status: Never Smoker   . Smokeless tobacco: Not on file  . Alcohol Use: No    Review of Systems  Constitutional:       Per HPI, otherwise negative  HENT:       Per HPI, otherwise negative  Respiratory:       Per HPI, otherwise negative  Cardiovascular:       Per HPI, otherwise negative  Gastrointestinal: Negative for vomiting.  Endocrine:       Negative aside from HPI  Genitourinary:       Neg aside from HPI   Musculoskeletal:       Per HPI, otherwise negative  Skin: Negative.   Neurological: Negative for syncope.    Allergies  Morphine and related; Antihistamines, diphenhydramine-type; Demerol; and Hydromorphone  Home Medications   Current Outpatient Rx  Name  Route  Sig  Dispense  Refill  . ibuprofen (ADVIL,MOTRIN) 200 MG tablet   Oral   Take 200 mg by mouth every 6 (six) hours as needed for fever or moderate pain.          BP 128/87  Pulse 75  Temp(Src) 97.6 F (36.4  C) (Oral)  Resp 20  SpO2 100% Physical Exam  Nursing note and vitals reviewed. Constitutional: He is oriented to person, place, and time. He appears well-developed. No distress.  HENT:  Head: Normocephalic and atraumatic.  Mouth/Throat: Uvula is midline, oropharynx is clear and moist and mucous membranes are normal. No oropharyngeal exudate, posterior oropharyngeal edema, posterior oropharyngeal erythema or tonsillar abscesses.  Eyes: Conjunctivae and EOM are normal.  Cardiovascular: Normal rate and regular rhythm.   Pulmonary/Chest: Effort normal and breath sounds normal. No stridor. No respiratory distress. He has no wheezes.  Abdominal: He exhibits no distension. There is no tenderness.  Musculoskeletal: He exhibits no edema.  Lymphadenopathy:    He has no cervical adenopathy.  Neurological: He is alert and oriented to person, place, and time. He displays no atrophy. No cranial nerve deficit or sensory deficit.  Skin: Skin is warm and dry.  Psychiatric: He has a normal mood and affect.    ED Course  Procedures (including critical care time) Labs Review Labs Reviewed  CBC WITH DIFFERENTIAL  COMPREHENSIVE METABOLIC PANEL  LIPASE, BLOOD   Imaging Review No results found.  EKG Interpretation   None       After the initial eval I reviewed his chart.   9:39 AM Patient continues to complain of nausea and generalized discomfort. meds given  10:51  AM Patient appears better.  He states that there has been improvement as well. We discussed all findings, his diagnosis of influenza-like illness, return precautions, follow up instructions. MDM   1. Influenza-like illness    Patient presents with ongoing cough, congestion, fatigue, generalized discomfort.  On exam the patient afebrile and hemodynamically stable, neurovascularly intact, awake, alert.  Patient's labs are reassuring.  Patient did require fluid resuscitation and analgesics, antiemetics for improvement.  He was,  however stable for discharge with outpatient followup after stabilization    Gerhard Munch, MD 05/20/13 1052

## 2013-06-06 ENCOUNTER — Encounter: Payer: Self-pay | Admitting: Family Medicine

## 2013-06-06 ENCOUNTER — Ambulatory Visit (INDEPENDENT_AMBULATORY_CARE_PROVIDER_SITE_OTHER): Payer: BC Managed Care – PPO | Admitting: Family Medicine

## 2013-06-06 VITALS — BP 120/88 | HR 70 | Temp 98.4°F | Resp 18 | Ht 69.0 in | Wt 194.0 lb

## 2013-06-06 DIAGNOSIS — B349 Viral infection, unspecified: Secondary | ICD-10-CM | POA: Insufficient documentation

## 2013-06-06 DIAGNOSIS — K219 Gastro-esophageal reflux disease without esophagitis: Secondary | ICD-10-CM

## 2013-06-06 DIAGNOSIS — B9789 Other viral agents as the cause of diseases classified elsewhere: Secondary | ICD-10-CM

## 2013-06-06 DIAGNOSIS — R079 Chest pain, unspecified: Secondary | ICD-10-CM

## 2013-06-06 DIAGNOSIS — R42 Dizziness and giddiness: Secondary | ICD-10-CM

## 2013-06-06 LAB — TSH: TSH: 3.969 u[IU]/mL (ref 0.350–4.500)

## 2013-06-06 MED ORDER — ESOMEPRAZOLE MAGNESIUM 40 MG PO CPDR: 40.0000 mg | DELAYED_RELEASE_CAPSULE | Freq: Every day | ORAL | Status: DC

## 2013-06-06 NOTE — Assessment & Plan Note (Signed)
I think his symptoms may be left over from his recent viral illness. He also has a muscle aches. His EKG is unremarkable. He had a negative chest x-ray and his metabolic panel and CBC were also normal 2 weeks ago. He needs to rest and get more fluids

## 2013-06-06 NOTE — Progress Notes (Signed)
   Subjective:    Patient ID: Danny Brewer, male    DOB: 12/25/1970, 42 y.o.   MRN: [id number]  HPI  Patient here secondary to feeling lightheaded nauseous for the past 3 or 4 days. He was treated for flulike illness in the ER about 2 weeks ago his family members also came down with the flu. He's not had any fever cough shortness of breath. He states that he gets a rush over him but he feels very lightheaded and dizzy that comes out of nowhere. He's been trying to drink some fluids. 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 He has not tried any medications at home. He does get some nausea after but not always related to eating.   Review of Systems   GEN- denies fatigue, fever, weight loss,weakness, recent illness HEENT- denies eye drainage, change in vision, nasal discharge, CVS- denies chest pain, palpitations RESP- denies SOB, cough, wheeze ABD- + N/ dreniesV, change in stools, abd pain MSK- denies joint pain, +muscle aches, injury Neuro- denies headache, +dizziness, syncope, seizure activity      Objective:   Physical Exam  GEN- NAD, alert and oriented x3   Lying 130/82, Sitting 122/88, standing 130/84 HEENT- PERRL, EOMI, non injected sclera, pink conjunctiva, MMM, oropharynx clear, TM Clear bilat, no effusion Neck- Supple, no LAD CVS- RRR, no murmur RESP-CTAB ABD-NABS,soft,NT,ND EXT- No edema Pulses- Radial, DP- 2+ MSK- Spine NT, FROM neck, FROM UE Neuro- CNII-XII in tact, no focal deficits  EKG- NSR      Assessment & Plan:

## 2013-06-06 NOTE — Patient Instructions (Addendum)
Start nexium once a day before breakfast- call for prescription if this helps Drink plenty of fluids You can take Tylenol or aleve for the shoulder if the nexium does not help Call if symptoms do not improve

## 2013-06-06 NOTE — Assessment & Plan Note (Signed)
Trial of Nexium. He was given samples from the office

## 2013-06-06 NOTE — Assessment & Plan Note (Signed)
He has had some episodes before the past he has had cardiac workup remotely which was benign. I will check a TSH otherwise I think his symptoms are more related to his recent illness

## 2013-06-07 ENCOUNTER — Encounter: Payer: Self-pay | Admitting: *Deleted

## 2013-06-26 ENCOUNTER — Encounter: Payer: Self-pay | Admitting: Family Medicine

## 2013-07-29 ENCOUNTER — Emergency Department (HOSPITAL_COMMUNITY)
Admission: EM | Admit: 2013-07-29 | Discharge: 2013-07-29 | Disposition: A | Discharge status: Home / Self Care | Payer: BC Managed Care – PPO | Attending: Emergency Medicine | Admitting: Emergency Medicine

## 2013-07-29 ENCOUNTER — Encounter (HOSPITAL_COMMUNITY): Payer: Self-pay | Admitting: Emergency Medicine

## 2013-07-29 DIAGNOSIS — R112 Nausea with vomiting, unspecified: Secondary | ICD-10-CM

## 2013-07-29 DIAGNOSIS — W57XXXA Bitten or stung by nonvenomous insect and other nonvenomous arthropods, initial encounter: Principal | ICD-10-CM | POA: Insufficient documentation

## 2013-07-29 DIAGNOSIS — S1096XA Insect bite of unspecified part of neck, initial encounter: Secondary | ICD-10-CM | POA: Insufficient documentation

## 2013-07-29 DIAGNOSIS — R5383 Other fatigue: Secondary | ICD-10-CM

## 2013-07-29 DIAGNOSIS — R42 Dizziness and giddiness: Secondary | ICD-10-CM | POA: Insufficient documentation

## 2013-07-29 DIAGNOSIS — Y929 Unspecified place or not applicable: Secondary | ICD-10-CM | POA: Insufficient documentation

## 2013-07-29 DIAGNOSIS — Z87442 Personal history of urinary calculi: Secondary | ICD-10-CM | POA: Insufficient documentation

## 2013-07-29 DIAGNOSIS — R109 Unspecified abdominal pain: Secondary | ICD-10-CM

## 2013-07-29 DIAGNOSIS — T63301A Toxic effect of unspecified spider venom, accidental (unintentional), initial encounter: Secondary | ICD-10-CM

## 2013-07-29 DIAGNOSIS — R5381 Other malaise: Secondary | ICD-10-CM | POA: Insufficient documentation

## 2013-07-29 DIAGNOSIS — Y939 Activity, unspecified: Secondary | ICD-10-CM | POA: Insufficient documentation

## 2013-07-29 DIAGNOSIS — Z8719 Personal history of other diseases of the digestive system: Secondary | ICD-10-CM | POA: Insufficient documentation

## 2013-07-29 MED ORDER — ONDANSETRON HCL 4 MG/2ML IJ SOLN
4.0000 mg | Freq: Once | INTRAMUSCULAR | Status: DC
Start: 2013-07-29 — End: 2013-07-29

## 2013-07-29 MED ORDER — IBUPROFEN 800 MG PO TABS
800.0000 mg | ORAL_TABLET | Freq: Once | ORAL | Status: AC
  Administered 2013-07-29: 800 mg via ORAL
  Filled 2013-07-29: qty 1

## 2013-07-29 MED ORDER — ONDANSETRON HCL 4 MG PO TABS: 4.0000 mg | ORAL_TABLET | Freq: Four times a day (QID) | ORAL | Status: DC

## 2013-07-29 MED ORDER — ONDANSETRON 4 MG PO TBDP
4.0000 mg | ORAL_TABLET | Freq: Once | ORAL | Status: AC
  Administered 2013-07-29: 4 mg via ORAL
  Filled 2013-07-29: qty 1

## 2013-07-29 NOTE — ED Notes (Signed)
Pt c/o burning down right side of neck, upset stomach, and "flushed" feeling.

## 2013-07-29 NOTE — ED Notes (Signed)
Pt was bitten by a spider about an hour ago. He has the spider with him in a ziploc bag. Afterwards he began to feel lightheaded, achy all over, vomited twice. There is no visible bite mark.

## 2013-07-29 NOTE — Discharge Instructions (Signed)
Spider Bite Spider bites are not common. Most spider bites do not cause serious problems. The elderly, very young children, and people with certain existing medical conditions are more likely to experience significant symptoms. SYMPTOMS  Spider bites may not cause any pain at first. Within 1 or 2 days of the bite, there may be swelling, redness, and pain in the bite area. However, some spider bites can cause pain within the first hour. TREATMENT  Your caregiver may prescribe antibiotic medicine if a bacterial infection develops in the bite. However, not all spider bites require antibiotics or prescription medicines.  HOME CARE INSTRUCTIONS  Do not scratch the bite area. Keep the bite area clean and dry. Wash the area with soap and water as directed.Viral Gastroenteritis Viral gastroenteritis is also known as stomach flu. This condition affects the stomach and intestinal tract. It can cause sudden diarrhea and vomiting. The illness typically lasts 3 to 8 days. Most people develop an immune response that eventually gets rid of the virus. While this natural response develops, the virus can make you quite ill. CAUSES  Many different viruses can cause gastroenteritis, such as rotavirus or noroviruses. You can catch one of these viruses by consuming contaminated food or water. You may also catch a virus by sharing utensils or other personal items with an infected person or by touching a contaminated surface. SYMPTOMS  The most common symptoms are diarrhea and vomiting. These problems can cause a severe loss of body fluids (dehydration) and a body salt (electrolyte) imbalance. Other symptoms may include:  Fever.  Headache.  Fatigue.  Abdominal pain. DIAGNOSIS  Your caregiver can usually diagnose viral gastroenteritis based on your symptoms and a physical exam. A stool sample may also be taken to test for the presence of viruses or other infections. TREATMENT  This illness typically goes away on  its own. Treatments are aimed at rehydration. The most serious cases of viral gastroenteritis involve vomiting so severely that you are not able to keep fluids down. In these cases, fluids must be given through an intravenous line (IV). HOME CARE INSTRUCTIONS   Drink enough fluids to keep your urine clear or pale yellow. Drink small amounts of fluids frequently and increase the amounts as tolerated.  Ask your caregiver for specific rehydration instructions.  Avoid:  Foods high in sugar.  Alcohol.  Carbonated drinks.  Tobacco.  Juice.  Caffeine drinks.  Extremely hot or cold fluids.  Fatty, greasy foods.  Too much intake of anything at one time.  Dairy products until 24 to 48 hours after diarrhea stops.  You may consume probiotics. Probiotics are active cultures of beneficial bacteria. They may lessen the amount and number of diarrheal stools in adults. Probiotics can be found in yogurt with active cultures and in supplements.  Wash your hands well to avoid spreading the virus.  Only take over-the-counter or prescription medicines for pain, discomfort, or fever as directed by your caregiver. Do not give aspirin to children. Antidiarrheal medicines are not recommended.  Ask your caregiver if you should continue to take your regular prescribed and over-the-counter medicines.  Keep all follow-up appointments as directed by your caregiver. SEEK IMMEDIATE MEDICAL CARE IF:   You are unable to keep fluids down.  You do not urinate at least once every 6 to 8 hours.  You develop shortness of breath.  You notice blood in your stool or vomit. This may look like coffee grounds.  You have abdominal pain that increases or is concentrated in  one small area (localized).  You have persistent vomiting or diarrhea.  You have a fever.  The patient is a child younger than 3 months, and he or she has a fever.  The patient is a child older than 3 months, and he or she has a fever and  persistent symptoms.  The patient is a child older than 3 months, and he or she has a fever and symptoms suddenly get worse.  The patient is a baby, and he or she has no tears when crying. MAKE SURE YOU:   Understand these instructions.  Will watch your condition.  Will get help right away if you are not doing well or get worse. Document Released: 05/25/2005 Document Revised: 08/17/2011 Document Reviewed: 03/11/2011 [facility] Patient Information 2014 [place], [place].    Put ice or cool compresses on the bite area.  Put ice in a plastic bag.  Place a towel between your skin and the bag.  Leave the ice on for 20 minutes, 4 times a day for the first 2 to 3 days, or as directed.  Keep the bite area elevated above the level of your heart. This helps reduce redness and swelling.  Only take over-the-counter or prescription medicines as directed by your caregiver.  If you are given antibiotics, take them as directed. Finish them even if you start to feel better. You may need a tetanus shot if:  You cannot remember when you had your last tetanus shot.  You have never had a tetanus shot.  The injury broke your skin. If you get a tetanus shot, your arm may swell, get red, and feel warm to the touch. This is common and not a problem. If you need a tetanus shot and you choose not to have one, there is a rare chance of getting tetanus. Sickness from tetanus can be serious. SEEK MEDICAL CARE IF: Your bite is not better after 3 days of treatment. SEEK IMMEDIATE MEDICAL CARE IF:  Your bite turns purple or develops increased swelling, pain, or redness.  You develop shortness of breath or chest pain.  You have muscle cramps or painful muscle spasms.  You develop abdominal pain, nausea, or vomiting.  You feel unusually tired or sleepy. MAKE SURE YOU:  Understand these instructions.  Will watch your condition.  Will get help right away if you are not doing well or get  worse. Document Released: 07/02/2004 Document Revised: 08/17/2011 Document Reviewed: 12/24/2010 [facility] Patient Information 2014 [place], [place].

## 2013-07-29 NOTE — ED Provider Notes (Signed)
CSN: [id number]     Arrival date & time 07/29/13  1027 History   First MD Initiated Contact with Patient 07/29/13 1008     Chief Complaint  Patient presents with  . Insect Bite     (Consider location/radiation/quality/duration/timing/severity/associated sxs/prior Treatment) HPI Comments: Pt is a 43 y.o. male with Pmhx as above who presents with spider bite to back of head about 8:45am (2 hrs ago).  About 15 mins after he reports developing, nausea, vomiting, lightheadedness, warmth.  Spider brought in bag.    Patient is a 43 y.o. male presenting with animal bite. The history is provided by the patient. No language interpreter was used.  Animal Bite Contact animal:  Insect Location:  Head/neck Head/neck injury location:  Scalp Time since incident:  2 hours Pain details:    Quality:  Burning   Severity:  Moderate   Timing:  Constant   Progression:  Unchanged Incident location:  Home Provoked: unprovoked   Notifications:  None Tetanus status:  Unknown Relieved by:  Nothing Worsened by:  Nothing tried Ineffective treatments:  None tried Associated symptoms: no numbness and no rash   Associated symptoms comment:  Nausea, vomiting, lightheadedness, warmth, crampy abdominal pain which all began suddenly about 15 mins after insect bite.    Past Medical History  Diagnosis Date  . GERD (gastroesophageal reflux disease)   . Kidney stones    Past Surgical History  Procedure Laterality Date  . Knee surgery     History reviewed. No pertinent family history. History  Substance Use Topics  . Smoking status: Never Smoker   . Smokeless tobacco: Not on file  . Alcohol Use: No    Review of Systems  Constitutional: Positive for fatigue. Negative for activity change and appetite change.  HENT: Negative for congestion, facial swelling, rhinorrhea and trouble swallowing.   Eyes: Negative for photophobia and pain.  Respiratory: Negative for cough, chest tightness and shortness of  breath.   Cardiovascular: Negative for chest pain and leg swelling.  Gastrointestinal: Positive for nausea, vomiting and abdominal pain. Negative for diarrhea and constipation.  Endocrine: Negative for polydipsia and polyuria.  Genitourinary: Negative for dysuria, urgency, decreased urine volume and difficulty urinating.  Musculoskeletal: Negative for back pain and gait problem.  Skin: Negative for color change, rash and wound.  Allergic/Immunologic: Negative for immunocompromised state.  Neurological: Positive for light-headedness. Negative for dizziness, facial asymmetry, speech difficulty, weakness, numbness and headaches.  Psychiatric/Behavioral: Negative for confusion, decreased concentration and agitation.      Allergies  Morphine and related; Antihistamines, diphenhydramine-type; Demerol; and Hydromorphone  Home Medications   Current Outpatient Rx  Name  Route  Sig  Dispense  Refill  . ondansetron (ZOFRAN) 4 MG tablet   Oral   Take 1 tablet (4 mg total) by mouth every 6 (six) hours.   12 tablet   0    BP 126/95  Pulse 67  Temp(Src) 97.9 F (36.6 C) (Oral)  Resp 15  SpO2 100% Physical Exam  Constitutional: He is oriented to person, place, and time. He appears well-developed and well-nourished. No distress.  HENT:  Head: Normocephalic and atraumatic.    Mouth/Throat: No oropharyngeal exudate.  Eyes: Pupils are equal, round, and reactive to light.  Neck: Normal range of motion. Neck supple.  Cardiovascular: Normal rate, regular rhythm and normal heart sounds.  Exam reveals no gallop and no friction rub.   No murmur heard. Pulmonary/Chest: Effort normal and breath sounds normal. No respiratory distress. He has no wheezes.  He has no rales.  Abdominal: Soft. Bowel sounds are normal. He exhibits no distension and no mass. There is no tenderness. There is no rebound and no guarding.  Musculoskeletal: Normal range of motion. He exhibits no edema and no tenderness.    Neurological: He is alert and oriented to person, place, and time. He has normal strength. He displays no tremor. No cranial nerve deficit or sensory deficit. He exhibits normal muscle tone. He displays a negative Romberg sign. Coordination and gait normal. GCS eye subscore is 4. GCS verbal subscore is 5. GCS motor subscore is 6.  Skin: Skin is warm and dry.  Psychiatric: He has a normal mood and affect.    ED Course  Procedures (including critical care time) Labs Review Labs Reviewed - No data to display Imaging Review No results found.  EKG Interpretation   None       MDM   Final diagnoses:  Spider bite  Abdominal cramping  Nausea & vomiting    Pt is a 43 y.o. male with Pmhx as above who presents with spider bite to back of head about 8:45am (2 hrs ago).  About 15 mins after he reports developing, nausea, vomiting, lightheadedness, warmth.  Spider brought in bag.  It is a small brown spider.  I cannot definitively ID it, but is is not a black widow or brown recluse spider.  On PE, VSS, pt in NAD.  GCS 15, no focal neuro findings.  Suspect anxiety or pain reaction as cause of symptoms vs early gastroenteritis symptoms. PE no c/w TIA, CVA.  There is known spider venom that would be c/w these symptoms in a brown spider.   Pt given PO zofran, ibuprofen.  He continues to have abdominal upset, but has had no emesis in department & remains well appearing.  Will d/c home w/ rx for zofran, and rec supportive care with rest PO hydration.  Return precautions given for new or worsening symptoms including worsening or uncontrolled pain, fever.          Shanna CiscoMegan E Docherty, MD 07/29/13 2036

## 2013-08-25 ENCOUNTER — Emergency Department (HOSPITAL_COMMUNITY)
Admission: EM | Admit: 2013-08-25 | Discharge: 2013-08-26 | Disposition: A | Discharge status: Home / Self Care | Payer: BC Managed Care – PPO | Attending: Emergency Medicine | Admitting: Emergency Medicine

## 2013-08-25 ENCOUNTER — Encounter (HOSPITAL_COMMUNITY): Payer: Self-pay | Admitting: Emergency Medicine

## 2013-08-25 DIAGNOSIS — M79606 Pain in leg, unspecified: Secondary | ICD-10-CM

## 2013-08-25 DIAGNOSIS — Z8719 Personal history of other diseases of the digestive system: Secondary | ICD-10-CM | POA: Insufficient documentation

## 2013-08-25 DIAGNOSIS — M79609 Pain in unspecified limb: Secondary | ICD-10-CM | POA: Insufficient documentation

## 2013-08-25 DIAGNOSIS — IMO0001 Reserved for inherently not codable concepts without codable children: Secondary | ICD-10-CM | POA: Insufficient documentation

## 2013-08-25 DIAGNOSIS — R5381 Other malaise: Secondary | ICD-10-CM | POA: Insufficient documentation

## 2013-08-25 DIAGNOSIS — R5383 Other fatigue: Secondary | ICD-10-CM

## 2013-08-25 DIAGNOSIS — F411 Generalized anxiety disorder: Secondary | ICD-10-CM | POA: Insufficient documentation

## 2013-08-25 DIAGNOSIS — R51 Headache: Secondary | ICD-10-CM | POA: Insufficient documentation

## 2013-08-25 DIAGNOSIS — R519 Headache, unspecified: Secondary | ICD-10-CM

## 2013-08-25 DIAGNOSIS — Z87442 Personal history of urinary calculi: Secondary | ICD-10-CM | POA: Insufficient documentation

## 2013-08-25 LAB — I-STAT CHEM 8, ED
BUN: 16 mg/dL (ref 6–23)
Calcium, Ion: 1.16 mmol/L (ref 1.12–1.23)
Chloride: 102 mEq/L (ref 96–112)
Creatinine, Ser: 1.2 mg/dL (ref 0.50–1.35)
Glucose, Bld: 111 mg/dL — ABNORMAL HIGH (ref 70–99)
HCT: 42 % (ref 39.0–52.0)
Hemoglobin: 14.3 g/dL (ref 13.0–17.0)
Potassium: 3.7 mEq/L (ref 3.7–5.3)
Sodium: 142 mEq/L (ref 137–147)
TCO2: 27 mmol/L (ref 0–100)

## 2013-08-25 NOTE — ED Notes (Signed)
Pt states that he began to have blurry vision, headache, pain to the back of his neck, and not feeling "right"; pt saw his PCP today who advised him to come to the ER for evaluation

## 2013-08-25 NOTE — ED Provider Notes (Signed)
CSN: [id number]     Arrival date & time 08/25/13  2107 History   First MD Initiated Contact with Patient 08/25/13 2136     Chief Complaint  Patient presents with  . Hypertension     (Consider location/radiation/quality/duration/timing/severity/associated sxs/prior Treatment) HPI Comments: Patient with no significant past medical history presents with complaint of intermittent left leg pain described as a pulsation which radiates to his head and causes a headache. Patient describes feeling fatigued and a general feeling of "not feeling right". He checked his blood pressure and found his systolic blood pressure to be 409140. This made him worry. Patient came to the emergency department for evaluation after calling his primary care physician. Patient denies signs of stroke including: facial droop, slurred speech, aphasia, weakness/numbness in extremities, imbalance/trouble walking. No loss of vision, chest pain, shortness of breath, or change in urination. Patient denies injury to his leg or leg swelling. Patient states that he takes naproxen with relief of headache pain. The onset of this condition was acute. The course is intermittent. Aggravating factors: none.   The history is provided by the patient.    Past Medical History  Diagnosis Date  . GERD (gastroesophageal reflux disease)   . Kidney stones    Past Surgical History  Procedure Laterality Date  . Knee surgery     Family History  Problem Relation Age of Onset  . Hypertension Mother   . Cancer Father   . Hypertension Sister   . Sudden death Brother    History  Substance Use Topics  . Smoking status: Never Smoker   . Smokeless tobacco: Not on file  . Alcohol Use: No    Review of Systems  Constitutional: Positive for fatigue. Negative for fever.  HENT: Negative for congestion, dental problem, rhinorrhea, sinus pressure and sore throat.   Eyes: Negative for photophobia, discharge, redness and visual disturbance.   Respiratory: Negative for cough and shortness of breath.   Cardiovascular: Negative for chest pain and leg swelling.  Gastrointestinal: Negative for nausea, vomiting, abdominal pain and diarrhea.  Genitourinary: Negative for dysuria.  Musculoskeletal: Positive for myalgias. Negative for gait problem, neck pain and neck stiffness.  Skin: Negative for rash.  Neurological: Positive for headaches. Negative for syncope, speech difficulty, weakness, light-headedness and numbness.  Psychiatric/Behavioral: Negative for confusion.      Allergies  Morphine and related; Antihistamines, diphenhydramine-type; Demerol; and Hydromorphone  Home Medications   Current Outpatient Rx  Name  Route  Sig  Dispense  Refill  . naproxen sodium (ANAPROX) 220 MG tablet   Oral   Take 440 mg by mouth 2 (two) times daily as needed (pain).         . ondansetron (ZOFRAN) 4 MG tablet   Oral   Take 1 tablet (4 mg total) by mouth every 6 (six) hours.   12 tablet   0    BP 131/80  Pulse 76  Temp(Src) 98 F (36.7 C) (Oral)  Resp 18  SpO2 98% Physical Exam  Nursing note and vitals reviewed. Constitutional: He is oriented to person, place, and time. He appears well-developed and well-nourished.  HENT:  Head: Normocephalic and atraumatic.  Right Ear: Tympanic membrane, external ear and ear canal normal.  Left Ear: Tympanic membrane, external ear and ear canal normal.  Nose: Nose normal.  Mouth/Throat: Uvula is midline, oropharynx is clear and moist and mucous membranes are normal.  Eyes: Conjunctivae, EOM and lids are normal. Pupils are equal, round, and reactive to light.  Neck:  Normal range of motion. Neck supple.  Cardiovascular: Normal rate, regular rhythm and normal heart sounds.   No murmur heard. Pulses:      Popliteal pulses are 2+ on the right side, and 2+ on the left side.       Dorsalis pedis pulses are 2+ on the right side, and 2+ on the left side.       Posterior tibial pulses are 2+ on  the right side, and 2+ on the left side.  Pulmonary/Chest: Effort normal and breath sounds normal. No respiratory distress. He has no wheezes. He has no rales.  Abdominal: Soft. There is no tenderness.  Musculoskeletal: Normal range of motion.       Cervical back: He exhibits normal range of motion, no tenderness and no bony tenderness.  Neurological: He is alert and oriented to person, place, and time. He has normal strength. No cranial nerve deficit or sensory deficit. He exhibits normal muscle tone. He displays a negative Romberg sign. Coordination and gait normal. GCS eye subscore is 4. GCS verbal subscore is 5. GCS motor subscore is 6.  Skin: Skin is warm and dry.  Psychiatric: His mood appears anxious.    ED Course  Procedures (including critical care time) Labs Review Labs Reviewed  I-STAT CHEM 8, ED - Abnormal; Notable for the following:    Glucose, Bld 111 (*)    All other components within normal limits   Imaging Review No results found.   EKG Interpretation None      10:26 PM Patient seen and examined. Work-up initiated.   Vital signs reviewed and are as follows: Filed Vitals:   08/25/13 2119  BP: 131/80  Pulse: 76  Temp: 98 F (36.7 C)  Resp: 18    Date: 08/26/2013  Rate: 75  Rhythm: normal sinus rhythm  QRS Axis: normal  Intervals: normal  ST/T Wave abnormalities: nonspecific ST/T changes  Conduction Disutrbances:none  Narrative Interpretation:   Old EKG Reviewed: none available  Patient informed of all results. Encouraged PCP followup. Patient counseled to return if they have weakness in their arms or legs, slurred speech, trouble walking or talking, confusion, trouble with their balance, or if they have any other concerns. Patient verbalizes understanding and agrees with plan.    MDM   Final diagnoses:  Leg pain  Headache   Patient with leg pain. Leg is neurovascularly intact. Do not suspect popliteal aneurysm. No signs of skin  infections.  Patient with headache. No neurologic changes. No signs of end organ damage and his blood pressure is not markedly elevated here.  Patient does seem anxious. He had cardiac workup last year which was negative per patient and family.  Feel patient can followup safely with PCP for further evaluation of his vague nonspecific symptoms.  No dangerous or life-threatening conditions suspected or identified by history, physical exam, and by work-up. No indications for hospitalization identified.   Renne Crigler, PA-C 08/26/13 [phone]

## 2013-08-25 NOTE — Discharge Instructions (Signed)
Please read and follow all provided instructions.  Your diagnoses today include:  1. Leg pain   2. Headache     Tests performed today include:  EKG - normal  Electrolytes - normal  Vital signs. See below for your results today.   Medications:   None  Additional information:  Follow any educational materials contained in this packet.  Your headache and leg pain today does not appear to be life-threatening or require hospitalization, but often the exact cause of headaches is not determined in the emergency department. Therefore, follow-up with your doctor is very important to find out what may have caused your headache and whether or not you need any further diagnostic testing or treatment.   Sometimes headaches can appear benign (not harmful), but then more serious symptoms can develop which should prompt an immediate re-evaluation by your doctor or the emergency department.  BE VERY CAREFUL not to take multiple medicines containing Tylenol (also called acetaminophen). Doing so can lead to an overdose which can damage your liver and cause liver failure and possibly death.   Follow-up instructions: Please follow-up with your primary care provider in the next 3 days for further evaluation of your symptoms. If you do not have a primary care doctor -- see below for referral information.   Return instructions:   Please return to the Emergency Department if you experience worsening symptoms.  Return if the medications do not resolve your headache, if it recurs, or if you have multiple episodes of vomiting or cannot keep down fluids.  Return if you have a change from the usual headache.  RETURN IMMEDIATELY IF you:  Develop a sudden, severe headache  Develop confusion or become poorly responsive or faint  Develop a fever above 100.26F or problem breathing  Have a change in speech, vision, swallowing, or understanding  Develop new weakness, numbness, tingling, incoordination in  your arms or legs  Have a seizure  Please return if you have any other emergent concerns.  Additional Information:  Your vital signs today were: BP 131/83   Pulse 76   Temp(Src) 98 F (36.7 C) (Oral)   Resp 18   SpO2 98% If your blood pressure (BP) was elevated above 135/85 this visit, please have this repeated by your doctor within one month. --------------

## 2013-08-25 NOTE — ED Notes (Signed)
EKG given to EDP,Wentz,MD. For review. 

## 2013-08-26 NOTE — ED Provider Notes (Signed)
Medical screening examination/treatment/procedure(s) were performed by non-physician practitioner and as supervising physician I was immediately available for consultation/collaboration.   EKG Interpretation None       Flint MelterElliott L Lateefa Crosby, MD 08/26/13 1203

## 2013-08-27 ENCOUNTER — Telehealth: Payer: Self-pay | Admitting: Family Medicine

## 2013-08-27 NOTE — Telephone Encounter (Signed)
Pt called after hours on on Friday, March 20 he states that he was at [facility] in his blood pressure was elevated he wanted to check it because he had a headache and blurred vision he's also had some pains in his legs. I've advised him to go to either urgent care or close emergency room to be evaluated he voiced understanding

## 2013-09-28 ENCOUNTER — Ambulatory Visit (INDEPENDENT_AMBULATORY_CARE_PROVIDER_SITE_OTHER): Payer: BC Managed Care – PPO | Admitting: Physician Assistant

## 2013-09-28 ENCOUNTER — Encounter: Payer: Self-pay | Admitting: Physician Assistant

## 2013-09-28 VITALS — BP 124/96 | HR 68 | Temp 98.0°F | Resp 18 | Wt 192.0 lb

## 2013-09-28 DIAGNOSIS — R109 Unspecified abdominal pain: Secondary | ICD-10-CM

## 2013-09-28 NOTE — Progress Notes (Signed)
Patient ID: Danny BoerRalph Brewer MRN: [id number], DOB: 04-21-1971, 43 y.o. Date of Encounter: 09/28/2013, 4:06 PM    Chief Complaint:  Chief Complaint  Patient presents with  . c/o sharp pains mid abd    states pain aroung his umbilicus and shooting down into private area     HPI: 43 y.o. year old AA male reports that he's been having episodes of sharp shooting fleeting pains that starts near his umbilicus and radiates down towards his genital area.   He reports that he has had episodes of this  in the past off and on over the past several  years now. Says  he had a colonoscopy and endoscopy in 2009 by Dr. Elnoria HowardHung. Had another endoscopy by Dr. Elnoria HowardHung in 2013. Says at one point they were wondering if he had acid reflux but he says that he does not think that these current symptoms are consistent with acid reflux at all. Says all the discomfart is down in his lower abdomen--not at the epigastrium or chest area.  Says he does [employer] work but he uses  equipment to do any necessary  Heavy lifting. He says that he does no abdominal exercises and no abdominal workout except for the bending / lifting  He  does  at his job.  He will get these sharp shooting fleeting pains and then after that, it  feels as if his abdominal muscles are tight-- as if he had been doing a bunch of sit-ups.  Says  for the past couple days these episodes have re- occurred. Says that  over the past couple of days these shooting fleeting pains " just hit him"  intermittently throughout the day while he is working. Says that last night even at home he was sitting at the computer and all of a sudden the  severe pain hit.  Says yesterday he did have some diarrhea. I asked how his bowel habits are in general.  Says prior to the diarrhea yesterday, he actually had several days of constipation. Says his stools had been hard so he had drank apple juice and eaten salads etc.     Home Meds: See attached medication section for any  medications that were entered at today's visit. The computer does not put those onto this list.The following list is a list of meds entered prior to today's visit.   No current outpatient prescriptions on file prior to visit.   No current facility-administered medications on file prior to visit.    Allergies:  Allergies  Allergen Reactions  . Morphine And Related Itching  . Antihistamines, Diphenhydramine-Type Hives  . Demerol Itching  . Hydromorphone Hives and Itching    Causes itching and hives with AMS.       Review of Systems: See HPI for pertinent ROS. All other ROS negative.    Physical Exam: Blood pressure 124/96, pulse 68, temperature 98 F (36.7 C), temperature source Oral, resp. rate 18, weight 192 lb (87.091 kg)., Body mass index is 28.34 kg/(m^2). General: WNWD AAM.  Appears in no acute distress. Lungs: Clear bilaterally to auscultation without wheezes, rales, or rhonchi. Breathing is unlabored. Heart: Regular rhythm. No murmurs, rubs, or gallops. Abdomen: Soft, non-tender, non-distended with normoactive bowel sounds. No hepatomegaly. No rebound/guarding. No obvious abdominal masses. Msk:  Strength and tone normal for age. Extremities/Skin: Warm and dry. Neuro: Alert and oriented X 3. Moves all extremities spontaneously. Gait is normal. CNII-XII grossly in tact. Psych:  Responds to questions appropriately with a normal  affect.     ASSESSMENT AND PLAN:  43 y.o. year old male with  1. Abdominal pain, unspecified site I think his symptoms may be secondary to irritable bowel syndrome. Discussed this with him. Offered a trial of medication to treat IBS versus having him followup with Dr. Elnoria HowardHung. He prefers to followup with Dr. Elnoria HowardHung so I will schedule this referral. I told him if the symptoms change or worsen or he develops any fever to go to the emergency room / follow up immediately. - Ambulatory referral to Gastroenterology   Signed, Shon HaleMary Beth East BarreDixon, GeorgiaPA,  [facility] 09/28/2013 4:06 PM

## 2013-10-02 ENCOUNTER — Encounter: Payer: Self-pay | Admitting: Family Medicine

## 2013-10-02 ENCOUNTER — Ambulatory Visit (INDEPENDENT_AMBULATORY_CARE_PROVIDER_SITE_OTHER): Payer: BC Managed Care – PPO | Admitting: Family Medicine

## 2013-10-02 VITALS — BP 128/72 | HR 68 | Temp 98.7°F | Resp 18 | Ht 69.0 in | Wt 188.0 lb

## 2013-10-02 DIAGNOSIS — K5289 Other specified noninfective gastroenteritis and colitis: Secondary | ICD-10-CM

## 2013-10-02 DIAGNOSIS — K529 Noninfective gastroenteritis and colitis, unspecified: Secondary | ICD-10-CM

## 2013-10-02 NOTE — Progress Notes (Signed)
   Subjective:    Patient ID: Danny Brewer, male    DOB: 08-Sep-1970, 43 y.o.   MRN: [id number]  HPI The patient has had days of fever between 101-103, crampy epigastric abdominal pain with intestinal spasms, diarrhea, chills, and sweats, and nausea. His broke last night. He has not had any diarrhea since yesterday. He is here today for reevaluation. He also has occasional cough but no other symptoms. He has not traveled outside the country. He has not been in the hospital recently. He has not taken antibiotics recently. He has no known sick contacts. Past Medical History  Diagnosis Date  . GERD (gastroesophageal reflux disease)   . Kidney stones    No current outpatient prescriptions on file prior to visit.   No current facility-administered medications on file prior to visit.   Allergies  Allergen Reactions  . Morphine And Related Itching  . Antihistamines, Diphenhydramine-Type Hives  . Demerol Itching  . Hydromorphone Hives and Itching    Causes itching and hives with AMS.    History   Social History  . Marital Status: Married    Spouse Name: N/A    Number of Children: N/A  . Years of Education: N/A   Occupational History  . Not on file.   Social History Main Topics  . Smoking status: Never Smoker   . Smokeless tobacco: Not on file  . Alcohol Use: No  . Drug Use: No  . Sexual Activity: Not on file   Other Topics Concern  . Not on file   Social History Narrative  . No narrative on file      Review of Systems  All other systems reviewed and are negative.      Objective:   Physical Exam  Vitals reviewed. Constitutional: He appears well-developed and well-nourished.  Neck: Neck supple. No thyromegaly present.  Cardiovascular: Normal rate, regular rhythm and normal heart sounds.  Exam reveals no gallop.   No murmur heard. Pulmonary/Chest: Effort normal and breath sounds normal. No respiratory distress. He has no wheezes. He has no rales.  Abdominal: Soft.  Bowel sounds are normal. He exhibits no distension. There is no tenderness. There is no rebound and no guarding.  Musculoskeletal: He exhibits no edema.  Lymphadenopathy:    He has no cervical adenopathy.          Assessment & Plan:  Gastroenteritis  symptoms sound like viral gastroenteritis. His symptoms are starting to improve. I recommended he push fluids, use Imodium as needed for diarrhea, a black dye at. I anticipate spontaneous resolution over the next 3 days.

## 2013-10-18 ENCOUNTER — Other Ambulatory Visit: Payer: Self-pay | Admitting: Gastroenterology

## 2013-10-18 DIAGNOSIS — R1084 Generalized abdominal pain: Secondary | ICD-10-CM

## 2013-10-25 ENCOUNTER — Ambulatory Visit
Admission: RE | Admit: 2013-10-25 | Discharge: 2013-10-25 | Disposition: A | Discharge status: Home / Self Care | Payer: BC Managed Care – PPO | Source: Ambulatory Visit | Attending: Gastroenterology | Admitting: Gastroenterology

## 2013-10-25 DIAGNOSIS — R1084 Generalized abdominal pain: Secondary | ICD-10-CM

## 2013-10-25 MED ORDER — IOHEXOL 300 MG/ML  SOLN
100.0000 mL | Freq: Once | INTRAMUSCULAR | Status: AC | PRN
  Administered 2013-10-25: 100 mL via INTRAVENOUS

## 2014-03-09 ENCOUNTER — Encounter: Payer: BC Managed Care – PPO | Admitting: Family Medicine

## 2014-03-16 ENCOUNTER — Encounter: Payer: BC Managed Care – PPO | Admitting: Family Medicine

## 2014-03-30 ENCOUNTER — Ambulatory Visit: Payer: BC Managed Care – PPO | Admitting: Family Medicine

## 2014-04-13 ENCOUNTER — Encounter: Payer: BC Managed Care – PPO | Admitting: Family Medicine

## 2014-06-08 DIAGNOSIS — K4090 Unilateral inguinal hernia, without obstruction or gangrene, not specified as recurrent: Secondary | ICD-10-CM

## 2014-06-08 HISTORY — DX: Unilateral inguinal hernia, without obstruction or gangrene, not specified as recurrent: K40.90

## 2014-06-19 ENCOUNTER — Other Ambulatory Visit: Payer: Self-pay | Admitting: Family Medicine

## 2014-06-19 ENCOUNTER — Other Ambulatory Visit: Payer: BC Managed Care – PPO

## 2014-06-19 DIAGNOSIS — Z125 Encounter for screening for malignant neoplasm of prostate: Secondary | ICD-10-CM

## 2014-06-19 DIAGNOSIS — Z0000 Encounter for general adult medical examination without abnormal findings: Secondary | ICD-10-CM

## 2014-06-20 LAB — CBC WITH DIFFERENTIAL/PLATELET
Basophils Absolute: 0.1 10*3/uL (ref 0.0–0.1)
Basophils Relative: 1 % (ref 0–1)
Eosinophils Absolute: 0.1 10*3/uL (ref 0.0–0.7)
Eosinophils Relative: 2 % (ref 0–5)
HCT: 41.4 % (ref 39.0–52.0)
Hemoglobin: 13.7 g/dL (ref 13.0–17.0)
Lymphocytes Relative: 35 % (ref 12–46)
Lymphs Abs: 2.5 10*3/uL (ref 0.7–4.0)
MCH: 27.6 pg (ref 26.0–34.0)
MCHC: 33.1 g/dL (ref 30.0–36.0)
MCV: 83.5 fL (ref 78.0–100.0)
MPV: 10.1 fL (ref 8.6–12.4)
Monocytes Absolute: 0.6 10*3/uL (ref 0.1–1.0)
Monocytes Relative: 8 % (ref 3–12)
Neutro Abs: 3.8 10*3/uL (ref 1.7–7.7)
Neutrophils Relative %: 54 % (ref 43–77)
Platelets: 277 10*3/uL (ref 150–400)
RBC: 4.96 MIL/uL (ref 4.22–5.81)
RDW: 14.3 % (ref 11.5–15.5)
WBC: 7 10*3/uL (ref 4.0–10.5)

## 2014-06-20 LAB — LIPID PANEL
Cholesterol: 186 mg/dL (ref 0–200)
HDL: 52 mg/dL (ref >39)
LDL Cholesterol: 122 mg/dL — ABNORMAL HIGH (ref 0–99)
Total CHOL/HDL Ratio: 3.6 Ratio
Triglycerides: 59 mg/dL (ref <150)
VLDL: 12 mg/dL (ref 0–40)

## 2014-06-20 LAB — COMPLETE METABOLIC PANEL WITH GFR
ALT: 14 U/L (ref 0–53)
AST: 15 U/L (ref 0–37)
Albumin: 4.1 g/dL (ref 3.5–5.2)
Alkaline Phosphatase: 53 U/L (ref 39–117)
BUN: 11 mg/dL (ref 6–23)
CO2: 30 mEq/L (ref 19–32)
Calcium: 9.2 mg/dL (ref 8.4–10.5)
Chloride: 103 mEq/L (ref 96–112)
Creat: 0.91 mg/dL (ref 0.50–1.35)
GFR, Est African American: >89 mL/min
GFR, Est Non African American: >89 mL/min
Glucose, Bld: 76 mg/dL (ref 70–99)
Potassium: 4 mEq/L (ref 3.5–5.3)
Sodium: 139 mEq/L (ref 135–145)
Total Bilirubin: 0.5 mg/dL (ref 0.2–1.2)
Total Protein: 7.1 g/dL (ref 6.0–8.3)

## 2014-06-22 ENCOUNTER — Ambulatory Visit (INDEPENDENT_AMBULATORY_CARE_PROVIDER_SITE_OTHER): Payer: BC Managed Care – PPO | Admitting: Family Medicine

## 2014-06-22 ENCOUNTER — Encounter: Payer: Self-pay | Admitting: Family Medicine

## 2014-06-22 VITALS — BP 110/68 | HR 78 | Temp 98.5°F | Resp 14 | Ht 69.0 in | Wt 182.0 lb

## 2014-06-22 DIAGNOSIS — Z23 Encounter for immunization: Secondary | ICD-10-CM

## 2014-06-22 DIAGNOSIS — Z0000 Encounter for general adult medical examination without abnormal findings: Secondary | ICD-10-CM

## 2014-06-22 NOTE — Addendum Note (Signed)
Addended by: Legrand RamsWILLIS, Marisa Hufstetler B on: 06/22/2014 11:28 AM   Modules accepted: Orders

## 2014-06-22 NOTE — Progress Notes (Signed)
Subjective:    Patient ID: Danny Brewer, male    DOB: July 13, 1970, 44 y.o.   MRN: [id number]  HPI Patient presents today for complete physical exam. He denies any complaints. He is due for a flu shot. His last tetanus shot was 5 years ago. His most recent lab work as listed below: Lab on 06/19/2014  Component Date Value Ref Range Status  . WBC 06/19/2014 7.0  4.0 - 10.5 K/uL Final  . RBC 06/19/2014 4.96  4.22 - 5.81 MIL/uL Final  . Hemoglobin 06/19/2014 13.7  13.0 - 17.0 g/dL Final  . HCT 06/19/2014 41.4  39.0 - 52.0 % Final  . MCV 06/19/2014 83.5  78.0 - 100.0 fL Final  . MCH 06/19/2014 27.6  26.0 - 34.0 pg Final  . MCHC 06/19/2014 33.1  30.0 - 36.0 g/dL Final  . RDW 06/19/2014 14.3  11.5 - 15.5 % Final  . Platelets 06/19/2014 277  150 - 400 K/uL Final  . MPV 06/19/2014 10.1  8.6 - 12.4 fL Final   ** Please note change in reference range(s). **  . Neutrophils Relative % 06/19/2014 54  43 - 77 % Final  . Neutro Abs 06/19/2014 3.8  1.7 - 7.7 K/uL Final  . Lymphocytes Relative 06/19/2014 35  12 - 46 % Final  . Lymphs Abs 06/19/2014 2.5  0.7 - 4.0 K/uL Final  . Monocytes Relative 06/19/2014 8  3 - 12 % Final  . Monocytes Absolute 06/19/2014 0.6  0.1 - 1.0 K/uL Final  . Eosinophils Relative 06/19/2014 2  0 - 5 % Final  . Eosinophils Absolute 06/19/2014 0.1  0.0 - 0.7 K/uL Final  . Basophils Relative 06/19/2014 1  0 - 1 % Final  . Basophils Absolute 06/19/2014 0.1  0.0 - 0.1 K/uL Final  . Smear Review 06/19/2014 Criteria for review not met   Final  . Sodium 06/19/2014 139  135 - 145 mEq/L Final  . Potassium 06/19/2014 4.0  3.5 - 5.3 mEq/L Final  . Chloride 06/19/2014 103  96 - 112 mEq/L Final  . CO2 06/19/2014 30  19 - 32 mEq/L Final  . Glucose, Bld 06/19/2014 76  70 - 99 mg/dL Final  . BUN 06/19/2014 11  6 - 23 mg/dL Final  . Creat 06/19/2014 0.91  0.50 - 1.35 mg/dL Final  . Total Bilirubin 06/19/2014 0.5  0.2 - 1.2 mg/dL Final  . Alkaline Phosphatase 06/19/2014 53  39 - 117 U/L  Final  . AST 06/19/2014 15  0 - 37 U/L Final  . ALT 06/19/2014 14  0 - 53 U/L Final  . Total Protein 06/19/2014 7.1  6.0 - 8.3 g/dL Final  . Albumin 06/19/2014 4.1  3.5 - 5.2 g/dL Final  . Calcium 06/19/2014 9.2  8.4 - 10.5 mg/dL Final  . GFR, Est African American 06/19/2014 >89   Final  . GFR, Est Non African American 06/19/2014 >89   Final   Comment:   The estimated GFR is a calculation valid for adults (>=8 years old) that uses the CKD-EPI algorithm to adjust for age and sex. It is   not to be used for children, pregnant women, hospitalized patients,    patients on dialysis, or with rapidly changing kidney function. According to the NKDEP, eGFR >89 is normal, 60-89 shows mild impairment, 30-59 shows moderate impairment, 15-29 shows severe impairment and <15 is ESRD.     Orders Only on 06/19/2014  Component Date Value Ref Range Status  . Cholesterol 06/19/2014  186  0 - 200 mg/dL Final   Comment: ATP III Classification:       < 200        mg/dL        Desirable      200 - 239     mg/dL        Borderline High      >= 240        mg/dL        High     . Triglycerides 06/19/2014 59  <150 mg/dL Final  . HDL 06/19/2014 52  >39 mg/dL Final  . Total CHOL/HDL Ratio 06/19/2014 3.6   Final  . VLDL 06/19/2014 12  0 - 40 mg/dL Final  . LDL Cholesterol 06/19/2014 122* 0 - 99 mg/dL Final   Comment:   Total Cholesterol/HDL Ratio:CHD Risk                        Coronary Heart Disease Risk Table                                        Men       Women          1/2 Average Risk              3.4        3.3              Average Risk              5.0        4.4           2X Average Risk              9.6        7.1           3X Average Risk             23.4       11.0 Use the calculated Patient Ratio above and the CHD Risk table  to determine the patient's CHD Risk. ATP III Classification (LDL):       < 100        mg/dL         Optimal      100 - 129     mg/dL         Near or Above Optimal       130 - 159     mg/dL         Borderline High      160 - 189     mg/dL         High       > 190        mg/dL         Very High      Past Medical History  Diagnosis Date  . GERD (gastroesophageal reflux disease)   . Kidney stones    Past Surgical History  Procedure Laterality Date  . Knee surgery     No current outpatient prescriptions on file prior to visit.   No current facility-administered medications on file prior to visit.   Allergies  Allergen Reactions  . Morphine And Related Itching  . Antihistamines, Diphenhydramine-Type Hives  . Demerol Itching  . Hydromorphone Hives and Itching    Causes itching and hives with AMS.  History   Social History  . Marital Status: Married    Spouse Name: N/A    Number of Children: N/A  . Years of Education: N/A   Occupational History  . Not on file.   Social History Main Topics  . Smoking status: Never Smoker   . Smokeless tobacco: Not on file  . Alcohol Use: No  . Drug Use: No  . Sexual Activity: Not on file   Other Topics Concern  . Not on file   Social History Narrative   Family History  Problem Relation Age of Onset  . Hypertension Mother   . Cancer Father   . Hypertension Sister   . Sudden death Brother       Review of Systems  All other systems reviewed and are negative.      Objective:   Physical Exam  Constitutional: He is oriented to person, place, and time. He appears well-developed and well-nourished. No distress.  HENT:  Head: Normocephalic and atraumatic.  Right Ear: External ear normal.  Left Ear: External ear normal.  Nose: Nose normal.  Mouth/Throat: Oropharynx is clear and moist. No oropharyngeal exudate.  Eyes: Conjunctivae and EOM are normal. Pupils are equal, round, and reactive to light. Right eye exhibits no discharge. Left eye exhibits no discharge. No scleral icterus.  Neck: Normal range of motion. Neck supple. No JVD present. No tracheal deviation present. No thyromegaly  present.  Cardiovascular: Normal rate, regular rhythm, normal heart sounds and intact distal pulses.  Exam reveals no gallop and no friction rub.   No murmur heard. Pulmonary/Chest: Effort normal and breath sounds normal. No stridor. No respiratory distress. He has no wheezes. He has no rales. He exhibits no tenderness.  Abdominal: Soft. Bowel sounds are normal. He exhibits no distension and no mass. There is no tenderness. There is no rebound and no guarding.  Genitourinary: Rectum normal, prostate normal and penis normal.  Musculoskeletal: Normal range of motion. He exhibits no edema or tenderness.  Lymphadenopathy:    He has no cervical adenopathy.  Neurological: He is alert and oriented to person, place, and time. He has normal reflexes. He displays normal reflexes. No cranial nerve deficit. He exhibits normal muscle tone. Coordination normal.  Skin: Skin is warm. No rash noted. He is not diaphoretic. No erythema. No pallor.  Psychiatric: He has a normal mood and affect. His behavior is normal. Judgment and thought content normal.  Vitals reviewed.         Assessment & Plan:  Routine general medical examination at a health care facility  Patient's physical exam today is completely normal. His blood pressure is excellent. Through dietary changes and exercise the patient has dropped his fasting blood sugar substantially. The remainder of his lab work is excellent. His prostate exam today is normal. I will send a PSA to complete prostate cancer screening. Patient received his flu shot today.

## 2014-06-23 LAB — PSA: PSA: 0.46 ng/mL (ref <=4.00)

## 2014-07-06 ENCOUNTER — Telehealth: Payer: Self-pay | Admitting: Family Medicine

## 2014-07-06 NOTE — Telephone Encounter (Signed)
[phone]  PT is calling to get his lab results

## 2014-07-06 NOTE — Telephone Encounter (Signed)
Pt never got PSA result.  Result was normal and pt made aware.

## 2014-07-13 ENCOUNTER — Ambulatory Visit: Payer: BC Managed Care – PPO | Admitting: Family Medicine

## 2014-08-27 ENCOUNTER — Telehealth: Payer: Self-pay | Admitting: Family Medicine

## 2014-08-31 ENCOUNTER — Ambulatory Visit: Payer: BC Managed Care – PPO | Admitting: Family Medicine

## 2014-09-03 ENCOUNTER — Ambulatory Visit: Payer: BC Managed Care – PPO | Admitting: Family Medicine

## 2014-09-14 ENCOUNTER — Encounter: Payer: Self-pay | Admitting: Family Medicine

## 2014-09-14 ENCOUNTER — Ambulatory Visit (INDEPENDENT_AMBULATORY_CARE_PROVIDER_SITE_OTHER): Payer: BC Managed Care – PPO | Admitting: Family Medicine

## 2014-09-14 VITALS — BP 130/76 | HR 68 | Temp 99.2°F | Resp 14 | Ht 69.0 in | Wt 180.0 lb

## 2014-09-14 DIAGNOSIS — I889 Nonspecific lymphadenitis, unspecified: Secondary | ICD-10-CM | POA: Diagnosis not present

## 2014-09-14 DIAGNOSIS — M545 Low back pain, unspecified: Secondary | ICD-10-CM

## 2014-09-14 DIAGNOSIS — J029 Acute pharyngitis, unspecified: Secondary | ICD-10-CM

## 2014-09-14 LAB — RAPID STREP SCREEN (MED CTR MEBANE ONLY): Streptococcus, Group A Screen (Direct): NEGATIVE

## 2014-09-14 MED ORDER — AMOXICILLIN 875 MG PO TABS: 875.0000 mg | ORAL_TABLET | Freq: Two times a day (BID) | ORAL | Status: DC

## 2014-09-14 NOTE — Patient Instructions (Addendum)
Strep test is negative Take antibiotics as prescribed Call if swelling does not go down Salt water gargle for throat You can use topical Aspercreme for joints or try Aleve F/U as needed

## 2014-09-14 NOTE — Progress Notes (Signed)
Patient ID: Danny Brewer Sweezy, male   DOB: 1970/07/02, 44 y.o.   MRN: [id number]   Subjective:    Patient ID: Danny Brewer Proano, male    DOB: 1970/07/02, 44 y.o.   MRN: [id number]  Patient presents for Illness  Pt here with swelling of right neck for past 2-3 days, concurrent with sore throat and subjective fever. No known sick contacts. No cough, no SOB, no current medications.  He also complains of chronic low back pain on and off, he has chronic left leg pain due to old injury. No radiating symptoms, no change in bowel or bladder.He has a very physical job and does a lot of lifting and he will often feel stiff after work    Review Of Systems:  GEN- denies fatigue,+ fever, weight loss,weakness, recent illness HEENT- denies eye drainage, change in vision, nasal discharge, CVS- denies chest pain, palpitations RESP- denies SOB, cough, wheeze ABD- denies N/V, change in stools, abd pain GU- denies dysuria, hematuria, dribbling, incontinence MSK- +joint pain, muscle aches, injury Neuro- denies headache, dizziness, syncope, seizure activity       Objective:    BP 130/76 mmHg  Pulse 68  Temp(Src) 99.2 F (37.3 C) (Oral)  Resp 14  Ht 5\' 9"  (1.753 m)  Wt 180 lb (81.647 kg)  BMI 26.57 kg/m2 GEN- NAD, alert and oriented x3 HEENT- PERRL, EOMI, non injected sclera, pink conjunctiva, MMM, post oropharynx mild injection , no exduats Neck- Supple, + swollen tender anterior cervical node on right side of neck, no fluctance lesions CVS- RRR, no murmur RESP-CTAB MSK- Spine NT, fair ROM, neg SLR, decreased flexibility noted Pulses- Radial,  2+        Assessment & Plan:      Problem List Items Addressed This Visit    None    Visit Diagnoses    Cervical lymphadenitis    -  Primary    Strep neg, noted LAD right side, treat with antibiotics, if not improved image neck    Acute pharyngitis, unspecified pharyngitis type        neg strep, may be viral but with significant LAD will treat    Relevant Orders    Rapid Strep Screen (Completed)    Midline low back pain without sciatica        fairly normal exam, neg SLR, he is staying active, but does a lot of lifting at work. Discussed NSAIDS as needed or aspercreme       Note: This dictation was prepared with Dragon dictation along with smaller phrase technology. Any transcriptional errors that result from this process are unintentional.

## 2014-09-19 ENCOUNTER — Encounter: Payer: Self-pay | Admitting: Family Medicine

## 2014-09-19 ENCOUNTER — Ambulatory Visit (INDEPENDENT_AMBULATORY_CARE_PROVIDER_SITE_OTHER): Payer: BC Managed Care – PPO | Admitting: Family Medicine

## 2014-09-19 VITALS — BP 100/70 | HR 82 | Temp 98.8°F | Resp 14 | Wt 183.0 lb

## 2014-09-19 DIAGNOSIS — S29012A Strain of muscle and tendon of back wall of thorax, initial encounter: Secondary | ICD-10-CM | POA: Diagnosis not present

## 2014-09-19 DIAGNOSIS — S46319A Strain of muscle, fascia and tendon of triceps, unspecified arm, initial encounter: Secondary | ICD-10-CM

## 2014-09-19 MED ORDER — MELOXICAM 15 MG PO TABS: 15.0000 mg | ORAL_TABLET | Freq: Every day | ORAL | Status: DC

## 2014-09-19 MED ORDER — TIZANIDINE HCL 4 MG PO TABS: 4.0000 mg | ORAL_TABLET | Freq: Four times a day (QID) | ORAL | Status: DC | PRN

## 2014-09-19 NOTE — Progress Notes (Signed)
Subjective:    Patient ID: Danny Brewer, male    DOB: 1970/11/23, 44 y.o.   MRN: [id number]  HPI Patient recent he saw my partner and was diagnosed with possible strep throat. He never got the antibiotic. However his sore throat is much better. His sore throat is gone.  Patient states that for the last 2-3 weeks he has had pain in both triceps as well as in his mid to upper back near his shoulder blades. Pain is worsened with resisted extension of his elbow bilaterally. It is worsened with resisted shoulder abduction bilaterally. He has no pain with shoulder abduction. He has no pain with range of motion in his shoulder. He has a negative empty can sign. He has a negative Hawkins sign. He has a negative Spurling sign. There is no rash anywhere on his body. There is no crepitus in the shoulder. The patient has no tenderness to palpation in his cervical or thoracic spinous processes. Patient states over the last few weeks at work he has been using a mop and doing more physical labor using muscles that he has perhaps not used in a while. At night he sometimes has muscle spasms in between his shoulder blades. Past Medical History  Diagnosis Date  . GERD (gastroesophageal reflux disease)   . Kidney stones    Past Surgical History  Procedure Laterality Date  . Knee surgery     Current Outpatient Prescriptions on File Prior to Visit  Medication Sig Dispense Refill  . amoxicillin (AMOXIL) 875 MG tablet Take 1 tablet (875 mg total) by mouth 2 (two) times daily. (Patient not taking: Reported on 09/19/2014) 14 tablet 0   No current facility-administered medications on file prior to visit.   Allergies  Allergen Reactions  . Morphine And Related Itching  . Antihistamines, Diphenhydramine-Type Hives  . Demerol Itching  . Hydromorphone Hives and Itching    Causes itching and hives with AMS.    History   Social History  . Marital Status: Married    Spouse Name: N/A  . Number of Children: N/A  .  Years of Education: N/A   Occupational History  . Not on file.   Social History Main Topics  . Smoking status: Never Smoker   . Smokeless tobacco: Not on file  . Alcohol Use: No  . Drug Use: No  . Sexual Activity: Not on file   Other Topics Concern  . Not on file   Social History Narrative      Review of Systems  All other systems reviewed and are negative.      Objective:   Physical Exam  Constitutional: He appears well-developed and well-nourished.  HENT:  Mouth/Throat: Oropharynx is clear and moist. No oropharyngeal exudate.  Neck: Neck supple.  Cardiovascular: Normal rate, regular rhythm and normal heart sounds.   Pulmonary/Chest: Effort normal and breath sounds normal.  Lymphadenopathy:    He has no cervical adenopathy.  Skin: No rash noted.   please see the description in the history of present illness        Assessment & Plan:  Strain of latissimus dorsi muscle, initial encounter - Plan: meloxicam (MOBIC) 15 MG tablet, tiZANidine (ZANAFLEX) 4 MG tablet  Triceps strain, unspecified laterality, initial encounter - Plan: meloxicam (MOBIC) 15 MG tablet, tiZANidine (ZANAFLEX) 4 MG tablet  I do not believe this is rheumatic fever. The patient's soreness in his shoulders/triceps and in his mid back began weeks prior to his sore throat. 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 I see no evidence of severe pathology today on exam. I recommended meloxicam 15 mg by mouth daily as an anti-inflammatory. The patient can use Zanaflex 4 mg every 6 hours as needed for muscle spasms. I anticipate spontaneous resolution over the next 3-4 days.

## 2014-10-29 ENCOUNTER — Ambulatory Visit: Payer: BC Managed Care – PPO | Admitting: Family Medicine

## 2014-10-30 ENCOUNTER — Encounter: Payer: Self-pay | Admitting: Family Medicine

## 2014-10-30 ENCOUNTER — Ambulatory Visit (INDEPENDENT_AMBULATORY_CARE_PROVIDER_SITE_OTHER): Payer: BC Managed Care – PPO | Admitting: Family Medicine

## 2014-10-30 VITALS — BP 124/80 | HR 78 | Temp 98.3°F | Resp 16 | Ht 69.0 in | Wt 180.0 lb

## 2014-10-30 DIAGNOSIS — R1031 Right lower quadrant pain: Secondary | ICD-10-CM | POA: Diagnosis not present

## 2014-10-30 DIAGNOSIS — L309 Dermatitis, unspecified: Secondary | ICD-10-CM | POA: Diagnosis not present

## 2014-10-30 DIAGNOSIS — G8929 Other chronic pain: Secondary | ICD-10-CM | POA: Diagnosis not present

## 2014-10-30 MED ORDER — TRIAMCINOLONE ACETONIDE 0.1 % EX CREA: 1.0000 "application " | TOPICAL_CREAM | Freq: Two times a day (BID) | CUTANEOUS | Status: DC

## 2014-10-30 NOTE — Progress Notes (Signed)
   Subjective:    Patient ID: Danny Brewer, male    DOB: 1971-03-15, 44 y.o.   MRN: [id number]  HPI Patient presents today with pain in his right lower abdominal quadrant. The pain is located over the inguinal canal. At times he states he can feel a bulge come out. He is able to reduce the bulge. He denies any melanoma. He denies any hematochezia. He denies any change in his bowel habits. Patient believes he has an inguinal hernia. On examination today, there is no visible direct inguinal hernia. There is no palpable bulge in the right inguinal canal. The patient has no testicular mass or scrotal mass. There is no evidence of an inguinal hernia on examination with Valsalva. Past Medical History  Diagnosis Date  . GERD (gastroesophageal reflux disease)   . Kidney stones    Past Surgical History  Procedure Laterality Date  . Knee surgery     No current outpatient prescriptions on file prior to visit.   No current facility-administered medications on file prior to visit.   Allergies  Allergen Reactions  . Morphine And Related Itching  . Antihistamines, Diphenhydramine-Type Hives  . Demerol Itching  . Hydromorphone Hives and Itching    Causes itching and hives with AMS.    History   Social History  . Marital Status: Married    Spouse Name: N/A  . Number of Children: N/A  . Years of Education: N/A   Occupational History  . Not on file.   Social History Main Topics  . Smoking status: Never Smoker   . Smokeless tobacco: Not on file  . Alcohol Use: No  . Drug Use: No  . Sexual Activity: Not on file   Other Topics Concern  . Not on file   Social History Narrative      Review of Systems  All other systems reviewed and are negative.      Objective:   Physical Exam  Constitutional: He appears well-developed and well-nourished.  Cardiovascular: Normal rate, regular rhythm and normal heart sounds.   Pulmonary/Chest: Effort normal and breath sounds normal.  Abdominal:  Soft. Bowel sounds are normal. He exhibits no distension and no mass. There is no tenderness. There is no rebound and no guarding. Hernia confirmed negative in the right inguinal area and confirmed negative in the left inguinal area.  Genitourinary: Testes normal and penis normal.  Lymphadenopathy:       Right: No inguinal adenopathy present.       Left: No inguinal adenopathy present.  Vitals reviewed.         Assessment & Plan:  Eczema - Plan: triamcinolone cream (KENALOG) 0.1 %  Abdominal pain, chronic, right lower quadrant  Patient's history sounds like he may have a lower abdominal muscle strain versus a small inguinal hernia although I am not able to appreciate inguinal hernia today on exam. Even if the patient does have an inguinal hernia there is no immediate  Indication for surgery. Therefore I recommend clinical monitoring. If symptoms worsen or he develops a large inguinal bulge, I will be glad to refer him to a general surgeon but at the present time I would not recommend surgery for what amounts to a very small hernia that is not clinically apparent. I also refilled the patient's triamcinolone that uses sparingly for eczema on his back

## 2014-11-26 ENCOUNTER — Telehealth: Payer: Self-pay | Admitting: Family Medicine

## 2014-11-26 ENCOUNTER — Encounter: Payer: Self-pay | Admitting: Family Medicine

## 2014-11-26 ENCOUNTER — Ambulatory Visit (INDEPENDENT_AMBULATORY_CARE_PROVIDER_SITE_OTHER): Payer: BC Managed Care – PPO | Admitting: Family Medicine

## 2014-11-26 VITALS — BP 110/70 | HR 68 | Temp 98.9°F | Resp 14 | Ht 69.0 in | Wt 180.0 lb

## 2014-11-26 DIAGNOSIS — R1030 Lower abdominal pain, unspecified: Secondary | ICD-10-CM

## 2014-11-26 LAB — URINALYSIS, ROUTINE W REFLEX MICROSCOPIC
Bilirubin Urine: NEGATIVE
Glucose, UA: NEGATIVE mg/dL
Hgb urine dipstick: NEGATIVE
Ketones, ur: NEGATIVE mg/dL
Leukocytes, UA: NEGATIVE
Nitrite: NEGATIVE
Specific Gravity, Urine: 1.025 (ref 1.005–1.030)
Urobilinogen, UA: 0.2 mg/dL (ref 0.0–1.0)
pH: 7 (ref 5.0–8.0)

## 2014-11-26 MED ORDER — HYDROCODONE-ACETAMINOPHEN 5-325 MG PO TABS: 1.0000 | ORAL_TABLET | Freq: Four times a day (QID) | ORAL | Status: DC | PRN

## 2014-11-26 MED ORDER — TAMSULOSIN HCL 0.4 MG PO CAPS: 0.4000 mg | ORAL_CAPSULE | Freq: Every day | ORAL | Status: DC

## 2014-11-26 NOTE — Progress Notes (Signed)
Subjective:    Patient ID: Danny Brewer, male    DOB: 10/14/1970, 44 y.o.   MRN: [id number]  HPI  Saturday, the patient developed the sudden onset of pain in his bladder that radiated into both testicles. The pain was intense and severe. Patient states that he felt like he was giving birth to a child. He is having a difficult time. He. His urine would suddenly stop. He also noticed some hematuria. Since that time the pain has improved dramatically. Today on examination both testicles are smooth and nontender. He has no tenderness to palpation around the epididymis. There is no rash on his penis. There is no urethral trauma. There is no urethral discharge. There is no inguinal lymphadenopathy. Urinalysis is completely normal. Past Medical History  Diagnosis Date  . GERD (gastroesophageal reflux disease)   . Kidney stones    Past Surgical History  Procedure Laterality Date  . Knee surgery     Current Outpatient Prescriptions on File Prior to Visit  Medication Sig Dispense Refill  . triamcinolone cream (KENALOG) 0.1 % Apply 1 application topically 2 (two) times daily. 30 g 1   No current facility-administered medications on file prior to visit.   Allergies  Allergen Reactions  . Morphine And Related Itching  . Antihistamines, Diphenhydramine-Type Hives  . Demerol Itching  . Hydromorphone Hives and Itching    Causes itching and hives with AMS.    History   Social History  . Marital Status: Married    Spouse Name: N/A  . Number of Children: N/A  . Years of Education: N/A   Occupational History  . Not on file.   Social History Main Topics  . Smoking status: Never Smoker   . Smokeless tobacco: Not on file  . Alcohol Use: No  . Drug Use: No  . Sexual Activity: Not on file   Other Topics Concern  . Not on file   Social History Narrative     Review of Systems  All other systems reviewed and are negative.      Objective:   Physical Exam  Cardiovascular: Normal  rate, regular rhythm and normal heart sounds.   Pulmonary/Chest: Effort normal and breath sounds normal.  Abdominal: Hernia confirmed negative in the right inguinal area and confirmed negative in the left inguinal area.  Genitourinary: Testes normal and penis normal. Right testis shows no mass, no swelling and no tenderness. Left testis shows no mass, no swelling and no tenderness. No penile erythema or penile tenderness. No discharge found.  Lymphadenopathy:       Right: No inguinal adenopathy present.       Left: No inguinal adenopathy present.  Vitals reviewed.         Assessment & Plan:  Lower abdominal pain - Plan: Urinalysis, Routine w reflex microscopic (not at [facility]), tamsulosin (FLOMAX) 0.4 MG CAPS capsule, HYDROcodone-acetaminophen (NORCO) 5-325 MG per tablet, Urine culture  I suspect the patient may a past kidney stone. On urinalysis today there is no evidence of urinary tract infection. There is no evidence of epididymitis. The patient denies any possible exposure to gonorrhea or chlamydia. There is no urethral discharge. Therefore I will treat the patient empirically like he is passing a kidney stone. He can use Norco 5/325 one to 2 tablets every 6 hours as needed for pain. Also recommended he push fluids and take Flomax 0.4 mg by mouth daily at bedtime for the next week.  If symptoms persist, proceed with a CT scan renal  stone protocol

## 2014-11-26 NOTE — Telephone Encounter (Signed)
Patient forgot to ask for a referral to the urologist.

## 2014-11-28 LAB — URINE CULTURE
Colony Count: NO GROWTH
Organism ID, Bacteria: NO GROWTH

## 2014-11-28 NOTE — Telephone Encounter (Signed)
Per wtp pt does not need to see urology at this point. Called and spoke to pt and he states that he belives he is passing another one and I explained to him as long as you are able to pass them then urology would not be needed but if her really wanted to see one we would be more then happy to set up that referral. He said we would wait.

## 2014-12-03 ENCOUNTER — Encounter (HOSPITAL_COMMUNITY): Payer: Self-pay

## 2014-12-03 ENCOUNTER — Emergency Department (HOSPITAL_COMMUNITY)
Admission: EM | Admit: 2014-12-03 | Discharge: 2014-12-03 | Disposition: A | Discharge status: Home / Self Care | Payer: BC Managed Care – PPO | Attending: Emergency Medicine | Admitting: Emergency Medicine

## 2014-12-03 DIAGNOSIS — Z79899 Other long term (current) drug therapy: Secondary | ICD-10-CM | POA: Insufficient documentation

## 2014-12-03 DIAGNOSIS — Z87442 Personal history of urinary calculi: Secondary | ICD-10-CM | POA: Diagnosis not present

## 2014-12-03 DIAGNOSIS — Z8719 Personal history of other diseases of the digestive system: Secondary | ICD-10-CM | POA: Insufficient documentation

## 2014-12-03 DIAGNOSIS — M546 Pain in thoracic spine: Secondary | ICD-10-CM | POA: Diagnosis not present

## 2014-12-03 DIAGNOSIS — Z7952 Long term (current) use of systemic steroids: Secondary | ICD-10-CM | POA: Insufficient documentation

## 2014-12-03 DIAGNOSIS — M25512 Pain in left shoulder: Secondary | ICD-10-CM | POA: Diagnosis present

## 2014-12-03 MED ORDER — METHOCARBAMOL 500 MG PO TABS: 500.0000 mg | ORAL_TABLET | Freq: Two times a day (BID) | ORAL | Status: DC

## 2014-12-03 MED ORDER — IBUPROFEN 800 MG PO TABS: 800.0000 mg | ORAL_TABLET | Freq: Three times a day (TID) | ORAL | Status: DC

## 2014-12-03 NOTE — ED Provider Notes (Signed)
CSN: [id number]     Arrival date & time 12/03/14  1539 History  This chart was scribed for non-physician practitioner Fayrene Helper, PA-C working with Mancel Bale, MD by Murriel Hopper, ED Scribe. This patient was seen in room WTR6/WTR6 and the patient's care was started at 4:16 PM.    Chief Complaint  Patient presents with  . Shoulder Pain      The history is provided by the patient. No language interpreter was used.     HPI Comments: Danny Brewer is a 44 y.o. male who presents to the Emergency Department complaining of worsening, persistent, aching, sharp left shoulder and thoracic back pain that has been present for a week. Pt states he passed a kidney stone last week and has been taking flomax and hydrocodone since then. 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 Pt states that throughout his work day his pain occasionally bothers him. Pt states that the hydrocodone he was prescribed helps him sleep, but notes it does not help him with pain during the day. Pain is currently 5 out of 10. He denies any lightheadedness, dizziness, fever, chills, abdominal pain, hematuria, chest pain, shortness of breath, hemoptysis, or rash.   Past Medical History  Diagnosis Date  . GERD (gastroesophageal reflux disease)   . Kidney stones    Past Surgical History  Procedure Laterality Date  . Knee surgery     Family History  Problem Relation Age of Onset  . Hypertension Mother   . Cancer Father   . Hypertension Sister   . Sudden death Brother    History  Substance Use Topics  . Smoking status: Never Smoker   . Smokeless tobacco: Not on file  . Alcohol Use: No    Review of Systems  Musculoskeletal: Positive for back pain and arthralgias.      Allergies  Morphine and related; Antihistamines, diphenhydramine-type; Demerol; and Hydromorphone  Home Medications   Prior to Admission medications    Medication Sig Start Date End Date Taking? Authorizing Provider  HYDROcodone-acetaminophen (NORCO) 5-325 MG per tablet Take 1 tablet by mouth every 6 (six) hours as needed for moderate pain. 11/26/14   Donita Brooks, MD  tamsulosin (FLOMAX) 0.4 MG CAPS capsule Take 1 capsule (0.4 mg total) by mouth daily. 11/26/14   Donita Brooks, MD  triamcinolone cream (KENALOG) 0.1 % Apply 1 application topically 2 (two) times daily. 10/30/14   Donita Brooks, MD   BP 120/75 mmHg  Pulse 92  Temp(Src) 98.7 F (37.1 C) (Oral)  Resp 15  SpO2 99% Physical Exam  Constitutional: He is oriented to person, place, and time. He appears well-developed and well-nourished.  HENT:  Head: Normocephalic and atraumatic.  Cardiovascular: Normal rate, regular rhythm and normal heart sounds.  Exam reveals no gallop and no friction rub.   No murmur heard. Pulmonary/Chest: Effort normal and breath sounds normal.  Abdominal: Soft. He exhibits no distension. There is no tenderness.  Musculoskeletal:  No significant midline spine tenderness Mild tenderness along the medial aspects of left scapular region without deformity or overlying skin changes. Left shoulder full ROM no focal point tenderness   Neurological: He is alert and oriented to person, place, and time.  Skin: Skin is warm and dry.  Psychiatric: He has a normal mood and affect.  Nursing note and vitals reviewed.   ED Course  Procedures (including critical care time)  DIAGNOSTIC STUDIES: Oxygen Saturation is 99% on room air, normal by my interpretation.    COORDINATION OF CARE: 4:21 PM Discussed treatment plan with pt at bedside and pt agreed to plan. Pt will be prescribed anti-inflammatory and muscle relaxer medications   Patient with pain to left shoulder blade. He does admits to perform heavy lifting at work on a regular basis. His symptom has been ongoing for the past week. He has no abdominal pain or CVA tenderness. I have low suspicion for  referred pain from diaphragmatic irritation due to bowel perforation or internal injury. He is afebrile with stable normal vital sign. He is in no acute distress. Will provide symptomatic treatment however patient made aware to return promptly if condition worsen. Patient was understanding and agrees with plan.  Labs Review Labs Reviewed - No data to display  Imaging Review No results found.   EKG Interpretation None      MDM   Final diagnoses:  Left shoulder pain    BP 120/75 mmHg  Pulse 92  Temp(Src) 98.7 F (37.1 C) (Oral)  Resp 15  Ht 5\' 9"  (1.753 m)  Wt 180 lb (81.647 kg)  BMI 26.57 kg/m2  SpO2 99%   I personally performed the services described in this documentation, which was scribed in my presence. The recorded information has been reviewed and is accurate.     Fayrene HelperBowie Darrah Dredge, PA-C 12/03/14 1631  Mancel BaleElliott Wentz, MD 12/03/14 2330

## 2014-12-03 NOTE — Discharge Instructions (Signed)
RICE: Routine Care for Injuries The routine care of many injuries includes Rest, Ice, Compression, and Elevation (RICE). HOME CARE INSTRUCTIONS  Rest is needed to allow your body to heal. Routine activities can usually be resumed when comfortable. Injured tendons and bones can take up to 6 weeks to heal. Tendons are the cord-like structures that attach muscle to bone.  Ice following an injury helps keep the swelling down and reduces pain.  Put ice in a plastic bag.  Place a towel between your skin and the bag.  Leave the ice on for 15-20 minutes, 3-4 times a day, or as directed by your health care provider. Do this while awake, for the first 24 to 48 hours. After that, continue as directed by your caregiver.  Compression helps keep swelling down. It also gives support and helps with discomfort. If an elastic bandage has been applied, it should be removed and reapplied every 3 to 4 hours. It should not be applied tightly, but firmly enough to keep swelling down. Watch fingers or toes for swelling, bluish discoloration, coldness, numbness, or excessive pain. If any of these problems occur, remove the bandage and reapply loosely. Contact your caregiver if these problems continue.  Elevation helps reduce swelling and decreases pain. With extremities, such as the arms, hands, legs, and feet, the injured area should be placed near or above the level of the heart, if possible. SEEK IMMEDIATE MEDICAL CARE IF:  You have persistent pain and swelling.  You develop redness, numbness, or unexpected weakness.  Your symptoms are getting worse rather than improving after several days. These symptoms may indicate that further evaluation or further X-rays are needed. Sometimes, X-rays may not show a small broken bone (fracture) until 1 week or 10 days later. Make a follow-up appointment with your caregiver. Ask when your X-ray results will be ready. Make sure you get your X-ray results. Document Released:  09/06/2000 Document Revised: 05/30/2013 Document Reviewed: 10/24/2010 [employer] Patient Information 2015 [employer]. This information is not intended to replace advice given to you by your health care provider. Make sure you discuss any questions you have with your health care provider.  

## 2014-12-03 NOTE — ED Notes (Signed)
Pt c/o of bil shoulder pain between shoulder blades x 1 week. Denies CP and SOB. Pt reports moving furniture 1 week earlier. Had vicodin from kidney stones DX earlier that helped relieve the pain. Pt continues to have pain at this time.

## 2014-12-11 ENCOUNTER — Ambulatory Visit: Payer: BC Managed Care – PPO | Admitting: Family Medicine

## 2014-12-18 ENCOUNTER — Encounter: Payer: Self-pay | Admitting: Family Medicine

## 2014-12-18 ENCOUNTER — Ambulatory Visit (INDEPENDENT_AMBULATORY_CARE_PROVIDER_SITE_OTHER): Payer: BC Managed Care – PPO | Admitting: Family Medicine

## 2014-12-18 ENCOUNTER — Telehealth: Payer: Self-pay | Admitting: Family Medicine

## 2014-12-18 VITALS — BP 126/80 | HR 88 | Temp 98.5°F | Resp 16 | Wt 183.0 lb

## 2014-12-18 DIAGNOSIS — R1012 Left upper quadrant pain: Secondary | ICD-10-CM | POA: Diagnosis not present

## 2014-12-18 NOTE — Telephone Encounter (Signed)
Pt called told to take some gaviscon and be here at 11AM today

## 2014-12-18 NOTE — Progress Notes (Signed)
Subjective:    Patient ID: Danny BoerRalph Brewer, male    DOB: [birth year], 44 y.o.   MRN: [id number]  HPI Patient was in his normal state of health this morning. He had a sausage biscuit. By the time the patient got to work he developed a sharp pain in his left upper quadrant right below his ribs that radiated to his xiphoid process. The pain lasts approximately 30 seconds. It then occurred again. It lasted approximately 30 seconds. It then slowly subsided. It is now more of a dull pain. He did not take Gaviscon I had recommended. He did try some antiacids at work which seemed to have helped. He denies any melanoma or hematochezia. He denies any nausea or vomiting. He denies any fever. He denies any signs or symptoms of a bowel obstruction. He denies any chest pain cough or shortness of breath or dyspnea on exertion. His abdominal exam at the present time is benign, soft, nondistended, nontender, with normal bowel sounds. Past Medical History  Diagnosis Date  . GERD (gastroesophageal reflux disease)   . Kidney stones    Past Surgical History  Procedure Laterality Date  . Knee surgery     Current Outpatient Prescriptions on File Prior to Visit  Medication Sig Dispense Refill  . HYDROcodone-acetaminophen (NORCO) 5-325 MG per tablet Take 1 tablet by mouth every 6 (six) hours as needed for moderate pain. 30 tablet 0  . ibuprofen (ADVIL,MOTRIN) 800 MG tablet Take 1 tablet (800 mg total) by mouth 3 (three) times daily. 21 tablet 0  . methocarbamol (ROBAXIN) 500 MG tablet Take 1 tablet (500 mg total) by mouth 2 (two) times daily. 20 tablet 0  . tamsulosin (FLOMAX) 0.4 MG CAPS capsule Take 1 capsule (0.4 mg total) by mouth daily. 30 capsule 0  . triamcinolone cream (KENALOG) 0.1 % Apply 1 application topically 2 (two) times daily. 30 g 1   No current facility-administered medications on file prior to visit.   Allergies  Allergen Reactions  . Morphine And Related Itching  . Antihistamines,  Diphenhydramine-Type Hives  . Demerol Itching  . Hydromorphone Hives and Itching    Causes itching and hives with AMS.    History   Social History  . Marital Status: Married    Spouse Name: N/A  . Number of Children: N/A  . Years of Education: N/A   Occupational History  . Not on file.   Social History Main Topics  . Smoking status: Never Smoker   . Smokeless tobacco: Not on file  . Alcohol Use: No  . Drug Use: No  . Sexual Activity: Not on file   Other Topics Concern  . Not on file   Social History Narrative   Family History  Problem Relation Age of Onset  . Hypertension Mother   . Cancer Father   . Hypertension Sister   . Sudden death Brother       Review of Systems  All other systems reviewed and are negative.      Objective:   Physical Exam  Constitutional: He appears well-developed and well-nourished.  Cardiovascular: Normal rate, regular rhythm and normal heart sounds.   Pulmonary/Chest: Effort normal and breath sounds normal. No respiratory distress. He has no wheezes. He has no rales.  Abdominal: Soft. Bowel sounds are normal. He exhibits no distension and no mass. There is no tenderness. There is no rebound and no guarding.  Vitals reviewed.         Assessment & Plan:  LUQ abdominal  pain  I suspect the patient may be having indigestion or acid reflux related to the sausage biscuit that he was taking his morning. I compounded 15 mL of viscous lidocaine with 2 Maalox tabletsand gave the patient a cocktail at approximately 11:30. I will monitor the patient see if his symptoms improved. He really has no risk factors for pancreatitis. The pain is limited to the left upper quadrant of his abdomen and therefore I believe gallstones are unlikely.  12:00-symptoms resolved.  Will check screening labs but I believe he had indigestion.  Can use maalox otc prn.

## 2014-12-18 NOTE — Telephone Encounter (Signed)
-----   Message from Danny BrooksWarren T Pickard, MD sent at 12/18/2014  7:39 AM EDT ----- Patient just paged me with LUQ abd pain. Have patient take gaviscon and he NTBS today at 11:00.

## 2014-12-19 LAB — CBC WITH DIFFERENTIAL/PLATELET
Basophils Absolute: 0.1 10*3/uL (ref 0.0–0.1)
Basophils Relative: 1 % (ref 0–1)
Eosinophils Absolute: 0.1 10*3/uL (ref 0.0–0.7)
Eosinophils Relative: 2 % (ref 0–5)
HCT: 41.5 % (ref 39.0–52.0)
Hemoglobin: 13.6 g/dL (ref 13.0–17.0)
Lymphocytes Relative: 36 % (ref 12–46)
Lymphs Abs: 2.2 10*3/uL (ref 0.7–4.0)
MCH: 27.6 pg (ref 26.0–34.0)
MCHC: 32.8 g/dL (ref 30.0–36.0)
MCV: 84.2 fL (ref 78.0–100.0)
MPV: 10 fL (ref 8.6–12.4)
Monocytes Absolute: 0.6 10*3/uL (ref 0.1–1.0)
Monocytes Relative: 9 % (ref 3–12)
Neutro Abs: 3.2 10*3/uL (ref 1.7–7.7)
Neutrophils Relative %: 52 % (ref 43–77)
Platelets: 211 10*3/uL (ref 150–400)
RBC: 4.93 MIL/uL (ref 4.22–5.81)
RDW: 13.7 % (ref 11.5–15.5)
WBC: 6.2 10*3/uL (ref 4.0–10.5)

## 2014-12-19 LAB — COMPLETE METABOLIC PANEL WITH GFR
ALT: 17 U/L (ref 0–53)
AST: 15 U/L (ref 0–37)
Albumin: 3.8 g/dL (ref 3.5–5.2)
Alkaline Phosphatase: 49 U/L (ref 39–117)
BUN: 12 mg/dL (ref 6–23)
CO2: 27 mEq/L (ref 19–32)
Calcium: 8.8 mg/dL (ref 8.4–10.5)
Chloride: 106 mEq/L (ref 96–112)
Creat: 0.93 mg/dL (ref 0.50–1.35)
GFR, Est African American: >89 mL/min
GFR, Est Non African American: >89 mL/min
Glucose, Bld: 83 mg/dL (ref 70–99)
Potassium: 4 mEq/L (ref 3.5–5.3)
Sodium: 141 mEq/L (ref 135–145)
Total Bilirubin: 0.3 mg/dL (ref 0.2–1.2)
Total Protein: 6.4 g/dL (ref 6.0–8.3)

## 2014-12-19 LAB — LIPASE: Lipase: <10 U/L (ref 0–75)

## 2014-12-20 ENCOUNTER — Encounter: Payer: Self-pay | Admitting: *Deleted

## 2015-01-14 ENCOUNTER — Ambulatory Visit (INDEPENDENT_AMBULATORY_CARE_PROVIDER_SITE_OTHER): Payer: BC Managed Care – PPO | Admitting: Family Medicine

## 2015-01-14 ENCOUNTER — Encounter: Payer: Self-pay | Admitting: Family Medicine

## 2015-01-14 VITALS — BP 108/76 | HR 68 | Temp 98.6°F | Resp 16 | Wt 179.0 lb

## 2015-01-14 DIAGNOSIS — K529 Noninfective gastroenteritis and colitis, unspecified: Secondary | ICD-10-CM | POA: Diagnosis not present

## 2015-01-14 MED ORDER — DICYCLOMINE HCL 20 MG PO TABS: 20.0000 mg | ORAL_TABLET | Freq: Four times a day (QID) | ORAL | Status: DC

## 2015-01-14 NOTE — Progress Notes (Signed)
   Subjective:    Patient ID: Danny Brewer, male    DOB: June 26, 1970, 44 y.o.   MRN: [id number]  HPI Patient's children recently had a stomach virus. Beginning Wednesday last week, the patient contracted similar symptoms. He developed crampy left upper and right upper quadrant intestinal pain, nausea and vomiting. He denies any diarrhea. He denies any constipation. He denies any hematemesis. He denies any melanoma or hematochezia. The vomiting improved by Friday. However he continues to have crampy intestinal pain. He denies any fever. He is starting to feel better. His children had similar symptoms for 3-4 days and improved gradually on their own.  On examination today, patient does have hyperactive bowel sounds but his abdomen is nondistended and nontender with no evidence of obstruction. Past Medical History  Diagnosis Date  . GERD (gastroesophageal reflux disease)   . Kidney stones    Past Surgical History  Procedure Laterality Date  . Knee surgery     No current outpatient prescriptions on file prior to visit.   No current facility-administered medications on file prior to visit.   Allergies  Allergen Reactions  . Morphine And Related Itching  . Antihistamines, Diphenhydramine-Type Hives  . Demerol Itching  . Hydromorphone Hives and Itching    Causes itching and hives with AMS.    History   Social History  . Marital Status: Married    Spouse Name: N/A  . Number of Children: N/A  . Years of Education: N/A   Occupational History  . Not on file.   Social History Main Topics  . Smoking status: Never Smoker   . Smokeless tobacco: Not on file  . Alcohol Use: No  . Drug Use: No  . Sexual Activity: Not on file   Other Topics Concern  . Not on file   Social History Narrative      Review of Systems  All other systems reviewed and are negative.      Objective:   Physical Exam  Constitutional: He appears well-developed and well-nourished.  Cardiovascular: Normal  rate, regular rhythm and normal heart sounds.   No murmur heard. Pulmonary/Chest: Effort normal and breath sounds normal. No respiratory distress. He has no wheezes. He has no rales.  Abdominal: Soft. He exhibits no distension. Bowel sounds are increased. There is no tenderness. There is no rebound and no guarding.  Vitals reviewed.         Assessment & Plan:  Gastroenteritis or enteritis - Plan: dicyclomine (BENTYL) 20 MG tablet  I believe the patient has viral gastroenteritis which is gradually improving on its own. I recommended tincture of time. I recommended a bland diet. I recommended that he push fluids. He can use Bentyl 20 mg by mouth every 6 hours when necessary intestinal cramps.

## 2015-01-19 ENCOUNTER — Other Ambulatory Visit: Payer: Self-pay | Admitting: Family Medicine

## 2015-02-28 ENCOUNTER — Ambulatory Visit: Payer: BC Managed Care – PPO | Admitting: Physician Assistant

## 2015-03-04 ENCOUNTER — Ambulatory Visit: Payer: BC Managed Care – PPO | Admitting: Family Medicine

## 2015-03-08 ENCOUNTER — Encounter: Payer: Self-pay | Admitting: Family Medicine

## 2015-03-08 ENCOUNTER — Ambulatory Visit (INDEPENDENT_AMBULATORY_CARE_PROVIDER_SITE_OTHER): Payer: BC Managed Care – PPO | Admitting: Family Medicine

## 2015-03-08 ENCOUNTER — Ambulatory Visit (HOSPITAL_COMMUNITY)
Admission: RE | Admit: 2015-03-08 | Discharge: 2015-03-08 | Disposition: A | Discharge status: Home / Self Care | Payer: BC Managed Care – PPO | Source: Ambulatory Visit | Attending: Family Medicine | Admitting: Family Medicine

## 2015-03-08 VITALS — BP 110/80 | HR 66 | Temp 98.6°F | Resp 16 | Wt 178.0 lb

## 2015-03-08 DIAGNOSIS — K4030 Unilateral inguinal hernia, with obstruction, without gangrene, not specified as recurrent: Secondary | ICD-10-CM | POA: Diagnosis not present

## 2015-03-08 DIAGNOSIS — Z77120 Contact with and (suspected) exposure to mold (toxic): Secondary | ICD-10-CM | POA: Diagnosis not present

## 2015-03-08 DIAGNOSIS — R05 Cough: Secondary | ICD-10-CM | POA: Diagnosis not present

## 2015-03-08 MED ORDER — CETIRIZINE HCL 10 MG PO TABS: 10.0000 mg | ORAL_TABLET | Freq: Every day | ORAL | Status: DC

## 2015-03-08 MED ORDER — FLUTICASONE PROPIONATE 50 MCG/ACT NA SUSP: 2.0000 | Freq: Every day | NASAL | Status: DC

## 2015-03-08 NOTE — Progress Notes (Signed)
   Subjective:    Patient ID: Danny Brewer, male    DOB: 1970/12/09, 44 y.o.   MRN: [id number]  HPI  Patient presents with over one month of runny nose, sneezing, itchy watery eyes, and nonproductive cough. He is recently discovered mold in his household. He believes he may be allergic to it. His right inguinal hernia has also started becoming more painful. He would like a referral to a general surgeon to discuss repair. Past Medical History  Diagnosis Date  . GERD (gastroesophageal reflux disease)   . Kidney stones    Past Surgical History  Procedure Laterality Date  . Knee surgery     No current outpatient prescriptions on file prior to visit.   No current facility-administered medications on file prior to visit.   Allergies  Allergen Reactions  . Morphine And Related Itching  . Antihistamines, Diphenhydramine-Type Hives  . Demerol Itching  . Hydromorphone Hives and Itching    Causes itching and hives with AMS.    Social History   Social History  . Marital Status: Married    Spouse Name: N/A  . Number of Children: N/A  . Years of Education: N/A   Occupational History  . Not on file.   Social History Main Topics  . Smoking status: Never Smoker   . Smokeless tobacco: Not on file  . Alcohol Use: No  . Drug Use: No  . Sexual Activity: Not on file   Other Topics Concern  . Not on file   Social History Narrative     Review of Systems  All other systems reviewed and are negative.      Objective:   Physical Exam  Constitutional: He appears well-developed and well-nourished.  HENT:  Right Ear: External ear normal.  Left Ear: External ear normal.  Nose: Mucosal edema and rhinorrhea present.  Mouth/Throat: Oropharynx is clear and moist. No oropharyngeal exudate.  Cardiovascular: Normal rate, regular rhythm and normal heart sounds.   Pulmonary/Chest: Effort normal and breath sounds normal.  Lymphadenopathy:    He has no cervical adenopathy.  Vitals  reviewed.         Assessment & Plan:  Mold exposure - Plan: IgE, CBC with Differential/Platelet, DG Chest 2 View, fluticasone (FLONASE) 50 MCG/ACT nasal spray, cetirizine (ZYRTEC) 10 MG tablet  Unilateral inguinal hernia with obstruction and without gangrene, recurrence not specified - Plan: Ambulatory referral to General Surgery  I will check IgE levels as well as a CBC to evaluate for eosinophils. I will treat the  Patient presumptively for allergies related to mold exposure was Zyrtec 10 mg by mouth daily as well as Flonase 2 sprays each nostril daily. I will also refer the patient to a general surgeon to discuss repair of his inguinal hernia.

## 2015-03-09 LAB — CBC WITH DIFFERENTIAL/PLATELET
Basophils Absolute: 0.1 10*3/uL (ref 0.0–0.1)
Basophils Relative: 1 % (ref 0–1)
Eosinophils Absolute: 0.1 10*3/uL (ref 0.0–0.7)
Eosinophils Relative: 1 % (ref 0–5)
HCT: 40.6 % (ref 39.0–52.0)
Hemoglobin: 13.6 g/dL (ref 13.0–17.0)
Lymphocytes Relative: 31 % (ref 12–46)
Lymphs Abs: 2 10*3/uL (ref 0.7–4.0)
MCH: 28 pg (ref 26.0–34.0)
MCHC: 33.5 g/dL (ref 30.0–36.0)
MCV: 83.7 fL (ref 78.0–100.0)
MPV: 10.5 fL (ref 8.6–12.4)
Monocytes Absolute: 0.7 10*3/uL (ref 0.1–1.0)
Monocytes Relative: 10 % (ref 3–12)
Neutro Abs: 3.7 10*3/uL (ref 1.7–7.7)
Neutrophils Relative %: 57 % (ref 43–77)
Platelets: 204 10*3/uL (ref 150–400)
RBC: 4.85 MIL/uL (ref 4.22–5.81)
RDW: 13.9 % (ref 11.5–15.5)
WBC: 6.5 10*3/uL (ref 4.0–10.5)

## 2015-03-11 ENCOUNTER — Encounter: Payer: Self-pay | Admitting: Family Medicine

## 2015-03-11 ENCOUNTER — Ambulatory Visit (INDEPENDENT_AMBULATORY_CARE_PROVIDER_SITE_OTHER): Payer: BC Managed Care – PPO | Admitting: Family Medicine

## 2015-03-11 VITALS — BP 130/90 | HR 74 | Temp 98.8°F | Resp 18 | Wt 176.0 lb

## 2015-03-11 DIAGNOSIS — J029 Acute pharyngitis, unspecified: Secondary | ICD-10-CM

## 2015-03-11 LAB — IGE: IgE (Immunoglobulin E), Serum: 54 kU/L (ref <115)

## 2015-03-11 LAB — RAPID STREP SCREEN (MED CTR MEBANE ONLY): Streptococcus, Group A Screen (Direct): NEGATIVE

## 2015-03-11 NOTE — Progress Notes (Signed)
Subjective:    Patient ID: Danny Brewer, male    DOB: 07/14/1970, 44 y.o.   MRN: [id number]  HPI  03/08/15 Patient presents with over one month of runny nose, sneezing, itchy watery eyes, and nonproductive cough. He is recently discovered mold in his household. He believes he may be allergic to it. His right inguinal hernia has also started becoming more painful. He would like a referral to a general surgeon to discuss repair.  At that time, my plan was: I will check IgE levels as well as a CBC to evaluate for eosinophils. I will treat the patient presumptively for allergies related to mold exposure with Zyrtec 10 mg by mouth daily as well as Flonase 2 sprays each nostril daily. I will also refer the patient to a general surgeon to discuss repair of his inguinal hernia.  03/11/15 CXR was completely clear, cbc revealed no leukocytosis or eosinophilia.  IgE levels are still pending.   Saturday, the patient developed a severe sore throat. He states that it hurts to swallow. He was unable to eat or drink anything over the weekend except liquids and soups. He denies any fevers or chills. Today on examination there is no erythema in the posterior oropharynx. There is no exudate in the posterior oropharynx. He is afebrile. There is no lymphadenopathy in the anterior cervical chain. His exam appears normal.  There is no perceptible swelling Past Medical History  Diagnosis Date  . GERD (gastroesophageal reflux disease)   . Kidney stones    Past Surgical History  Procedure Laterality Date  . Knee surgery     Current Outpatient Prescriptions on File Prior to Visit  Medication Sig Dispense Refill  . cetirizine (ZYRTEC) 10 MG tablet Take 1 tablet (10 mg total) by mouth daily. 30 tablet 11  . fluticasone (FLONASE) 50 MCG/ACT nasal spray Place 2 sprays into both nostrils daily. 16 g 6   No current facility-administered medications on file prior to visit.   Allergies  Allergen Reactions  . Morphine  And Related Itching  . Antihistamines, Diphenhydramine-Type Hives  . Demerol Itching  . Hydromorphone Hives and Itching    Causes itching and hives with AMS.    Social History   Social History  . Marital Status: Married    Spouse Name: N/A  . Number of Children: N/A  . Years of Education: N/A   Occupational History  . Not on file.   Social History Main Topics  . Smoking status: Never Smoker   . Smokeless tobacco: Not on file  . Alcohol Use: No  . Drug Use: No  . Sexual Activity: Not on file   Other Topics Concern  . Not on file   Social History Narrative     Review of Systems  All other systems reviewed and are negative.      Objective:   Physical Exam  Constitutional: He appears well-developed and well-nourished.  HENT:  Right Ear: External ear normal.  Left Ear: External ear normal.  Nose: Mucosal edema and rhinorrhea present.  Mouth/Throat: Oropharynx is clear and moist. No oropharyngeal exudate.  Cardiovascular: Normal rate, regular rhythm and normal heart sounds.   Pulmonary/Chest: Effort normal and breath sounds normal.  Lymphadenopathy:    He has no cervical adenopathy.  Vitals reviewed.         Assessment & Plan:  Sore throat  Patient strep test today is negative. I will send a throat culture. I suspect that he has mild viral pharyngitis versus postnasal  drip from allergies causing pharyngitis. I will treat the patient symptomatically with [employer]'s Magic mouthwash 1 teaspoon gargle and swallow every 6 hours as needed. I anticipate gradual improvement over the next 3-4 days.

## 2015-03-11 NOTE — Addendum Note (Signed)
Addended by: Legrand Rams B on: 03/11/2015 11:07 AM   Modules accepted: Orders

## 2015-03-13 LAB — CULTURE, GROUP A STREP: Organism ID, Bacteria: NORMAL

## 2015-03-15 ENCOUNTER — Encounter: Payer: Self-pay | Admitting: Family Medicine

## 2015-03-22 ENCOUNTER — Other Ambulatory Visit: Payer: Self-pay | Admitting: Surgery

## 2015-03-22 NOTE — H&P (Signed)
Danny Brewer 03/22/2015 2:57 PM Location: [facility] Patient #: [id number] DOB: 02/04/1971 Married / Language: English / Race: Black or African American Male History of Present Illness Ardeth Sportsman MD; 03/22/2015 3:31 PM) Patient words: hernia.  The patient is a 44 year old male who presents with an inguinal hernia. Patient sent for surgical consultation by Dr. Lynnea Ferrier. Concern for increasingly symptomatic RIGHT inguinal hernia. Pleasant male. Comes today with his wife and son. Has noticed some groin discomfort and now a bulge over the past year. Became more bothersome. 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 Because it had become more bothersome to him, the patient and his wife were concerned. His primary care physician agreed. Recommended surgical evaluation. Patient denies any prior abdominal surgery. No hernia surgery. Normally could walk for a mile or 2 without difficulty. On his feet all day. Normally has a bowel movement twice a day but bleeding is gone down to once a day. He wonders if it's related to the lump that is more obvious. Occasionally notes gurgling in the groin. Can reduce it when he lies down. His had a colonoscopy in the past which showed no abnormalities that he can recall. Recently had an episode of some viral pharyngitis that is now resolved. Other Problems Lamar Laundry Bynum, CMA; 03/22/2015 2:58 PM) Back Pain Enlarged Prostate Gastroesophageal Reflux Disease High blood pressure Kidney Stone  Past Surgical History Gilmer Mor, CMA; 03/22/2015 2:57 PM) Knee Surgery Left.  Diagnostic Studies History Gilmer Mor, CMA; 03/22/2015 2:57 PM) Colonoscopy 1-5 years ago  Allergies Lamar Laundry Bynum, CMA; 03/22/2015 2:59 PM) Morphine Sulfate (PF) *ANALGESICS - OPIOID* Demerol *ANALGESICS - OPIOID* HYDROmorphone HCl *ANALGESICS - OPIOID* Antihistamine-1 *ANTIHISTAMINES*  Medication History Lamar Laundry Bynum, CMA; 03/22/2015  2:59 PM) No Current Medications Medications Reconciled  Social History Gilmer Mor, CMA; 03/22/2015 2:58 PM) Caffeine use Tea. No alcohol use No drug use Tobacco use Never smoker.  Family History Gilmer Mor, CMA; 03/22/2015 2:57 PM) Alcohol Abuse Father. Arthritis Sister. Heart Disease Brother, Father, Sister. Heart disease in male family member before age 57 Heart disease in male family member before age 57 Hypertension Brother. Ischemic Bowel Disease Sister. Respiratory Condition Father. Thyroid problems Brother, Mother.     Review of Systems Lamar Laundry Bynum CMA; 03/22/2015 2:58 PM) Skin Present- Dryness. Not Present- Change in Wart/Mole, Hives, Jaundice, New Lesions, Non-Healing Wounds, Rash and Ulcer. HEENT Present- Sore Throat and Wears glasses/contact lenses. Not Present- Earache, Hearing Loss, Hoarseness, Nose Bleed, Oral Ulcers, Ringing in the Ears, Seasonal Allergies, Sinus Pain, Visual Disturbances and Yellow Eyes. Respiratory Not Present- Bloody sputum, Chronic Cough, Difficulty Breathing, Snoring and Wheezing. Cardiovascular Not Present- Chest Pain, Difficulty Breathing Lying Down, Leg Cramps, Palpitations, Rapid Heart Rate, Shortness of Breath and Swelling of Extremities. Gastrointestinal Present- Abdominal Pain, Bloating, Change in Bowel Habits, Difficulty Swallowing, Gets full quickly at meals and Hemorrhoids. Not Present- Bloody Stool, Chronic diarrhea, Constipation, Excessive gas, Indigestion, Nausea, Rectal Pain and Vomiting. Male Genitourinary Present- Change in Urinary Stream. Not Present- Blood in Urine, Frequency, Impotence, Nocturia, Painful Urination, Urgency and Urine Leakage. Musculoskeletal Present- Joint Stiffness. Not Present- Back Pain, Joint Pain, Muscle Pain, Muscle Weakness and Swelling of Extremities. Neurological Not Present- Decreased Memory, Fainting, Headaches, Numbness, Seizures, Tingling, Tremor, Trouble walking and  Weakness.  Vitals (Sonya Bynum CMA; 03/22/2015 2:58 PM) 03/22/2015 2:58 PM Weight: 174 lb Height: 68in Body Surface Area: 1.95 m Body Mass Index: 26.46 kg/m Temp.: 69F(Temporal)  Pulse: 79 (Regular)  BP: 126/80 (Sitting, Left Arm,  Standard)     Physical Exam Ardeth Sportsman MD; 03/22/2015 3:28 PM)  General Mental Status-Alert. General Appearance-Not in acute distress, Not Sickly. Orientation-Oriented X3. Hydration-Well hydrated. Voice-Normal. Note: Pleasant. Chatty. Affable.   Integumentary Global Assessment Upon inspection and palpation of skin surfaces of the - Axillae: non-tender, no inflammation or ulceration, no drainage. and Distribution of scalp and body hair is normal. General Characteristics Temperature - normal warmth is noted.  Head and Neck Head-normocephalic, atraumatic with no lesions or palpable masses. Face Global Assessment - atraumatic, no absence of expression. Neck Global Assessment - no abnormal movements, no bruit auscultated on the right, no bruit auscultated on the left, no decreased range of motion, non-tender. Trachea-midline. Thyroid Gland Characteristics - non-tender.  Eye Eyeball - Left-Extraocular movements intact, No Nystagmus. Eyeball - Right-Extraocular movements intact, No Nystagmus. Cornea - Left-No Hazy. Cornea - Right-No Hazy. Sclera/Conjunctiva - Left-No scleral icterus, No Discharge. Sclera/Conjunctiva - Right-No scleral icterus, No Discharge. Pupil - Left-Direct reaction to light normal. Pupil - Right-Direct reaction to light normal.  ENMT Ears Pinna - Left - no drainage observed, no generalized tenderness observed. Right - no drainage observed, no generalized tenderness observed. Nose and Sinuses External Inspection of the Nose - no destructive lesion observed. Inspection of the nares - Left - quiet respiration. Right - quiet respiration. Mouth and Throat Lips - Upper Lip -  no fissures observed, no pallor noted. Lower Lip - no fissures observed, no pallor noted. Nasopharynx - no discharge present. Oral Cavity/Oropharynx - Tongue - no dryness observed. Oral Mucosa - no cyanosis observed. Hypopharynx - no evidence of airway distress observed.  Chest and Lung Exam Inspection Movements - Normal and Symmetrical. Accessory muscles - No use of accessory muscles in breathing. Palpation Palpation of the chest reveals - Non-tender. Auscultation Breath sounds - Normal and Clear.  Cardiovascular Auscultation Rhythm - Regular. Murmurs & Other Heart Sounds - Auscultation of the heart reveals - No Murmurs and No Systolic Clicks.  Abdomen Inspection Inspection of the abdomen reveals - No Visible peristalsis and No Abnormal pulsations. Umbilicus - No Bleeding, No Urine drainage. Palpation/Percussion Palpation and Percussion of the abdomen reveal - Soft, Non Tender, No Rebound tenderness, No Rigidity (guarding) and No Cutaneous hyperesthesia. Note: Mild diastases recti. No umbilical hernia. Soft. Nontender nondistended.   Male Genitourinary Sexual Maturity Tanner 5 - Adult hair pattern and Adult penile size and shape. Note: Circumcised male. Testes epididymides and cords normal without any concerning masses. Obvious RIGHT groin bulge reducible consistent with inguinal hernia. Mild laxity but no definite hernia on the LEFT side.   Peripheral Vascular Upper Extremity Inspection - Left - No Cyanotic nailbeds, Not Ischemic. Right - No Cyanotic nailbeds, Not Ischemic.  Neurologic Neurologic evaluation reveals -normal attention span and ability to concentrate, able to name objects and repeat phrases. Appropriate fund of knowledge , normal sensation and normal coordination. Mental Status Affect - not angry, not paranoid. Cranial Nerves-Normal Bilaterally. Gait-Normal.  Neuropsychiatric Mental status exam performed with findings of-able to articulate well with  normal speech/language, rate, volume and coherence, thought content normal with ability to perform basic computations and apply abstract reasoning and no evidence of hallucinations, delusions, obsessions or homicidal/suicidal ideation.  Musculoskeletal Global Assessment Spine, Ribs and Pelvis - no instability, subluxation or laxity. Right Upper Extremity - no instability, subluxation or laxity.  Lymphatic Head & Neck  General Head & Neck Lymphatics: Bilateral - Description - No Localized lymphadenopathy. Axillary  General Axillary Region: Bilateral - Description - No Localized  lymphadenopathy. Femoral & Inguinal  Generalized Femoral & Inguinal Lymphatics: Left - Description - No Localized lymphadenopathy. Right - Description - No Localized lymphadenopathy.    Assessment & Plan Ardeth Sportsman(Rochell Puett C. Daymian Lill MD; 03/22/2015 3:32 PM)  RIGHT INGUINAL HERNIA (K40.90) Impression: Obvious RIGHT groin hernia reducible. No definite hernia on the LEFT but he wonders if maybe something could be there as well. I offerred diagnostic laparoscopy and repair of hernias found. They are interested in that.  He's hoping to delay surgery until winter break. Was hoping for mid December. That way he can use 4?6 weeks to recover and minimize PAL time. His wife worries that may be too long but since he is not very symptomatic for thick and can still work with it, reasonable to give that a try with a low threshold to move up sooner if it becomes more symptomatic.  Current Plans You are being scheduled for surgery - Our schedulers will call you.  You should hear from our office's scheduling department within 5 working days about the location, date, and time of surgery. We try to make accommodations for patient's preferences in scheduling surgery, but sometimes the OR schedule or the surgeon's schedule prevents us from making those accommodations.  If you have not heard from our office [phone]) in 5 working days, call  the office and ask for your surgeon's nurse.  If you have other questions about your diagnosis, plan, or surgery, call the office and ask for your surgeon's nurse. Written instructions provided Pt Education - Pamphlet Given - Laparoscopic Hernia Repair: discussed with patient and provided information.   The anatomy & physiology of the abdominal wall and pelvic floor was discussed. The pathophysiology of hernias in the inguinal and pelvic region was discussed. Natural history risks such as progressive enlargement, pain, incarceration, and strangulation was discussed. Contributors to complications such as smoking, obesity, diabetes, prior surgery, etc were discussed.  I feel the risks of no intervention will lead to serious problems that outweigh the operative risks; therefore, I recommended surgery to reduce and repair the hernia. I explained laparoscopic techniques with possible need for an open approach. I noted usual use of mesh to patch and/or buttress hernia repair  Risks such as bleeding, infection, abscess, need for further treatment, heart attack, death, and other risks were discussed. I noted a good likelihood this will help address the problem. Goals of post-operative recovery were discussed as well. Possibility that this will not correct all symptoms was explained. I stressed the importance of low-impact activity, aggressive pain control, avoiding constipation, & not pushing through pain to minimize risk of post-operative chronic pain or injury. Possibility of reherniation was discussed. We will work to minimize complications.  An educational handout further explaining the pathology & treatment options was given as well. Questions were answered. The patient expresses understanding & wishes to proceed with surgery. Pt Education - [facility] Hernia Post-Op HCI (Deaja Rizo): discussed with patient and provided information. Pt Education - [facility] Pain Control (Rishita Petron)   The anatomy & physiology of the  abdominal wall and pelvic floor was discussed. The pathophysiology of hernias in the inguinal and pelvic region was discussed. Natural history risks such as progressive enlargement, pain, incarceration, and strangulation was discussed. Contributors to complications such as smoking, obesity, diabetes, prior surgery, etc were discussed.  I feel the risks of no intervention will lead to serious problems that outweigh the operative risks; therefore, I recommended surgery to reduce and repair the hernia. I explained laparoscopic techniques with possible need  for an open approach. I noted usual use of mesh to patch and/or buttress hernia repair  Risks such as bleeding, infection, abscess, need for further treatment, heart attack, death, and other risks were discussed. I noted a good likelihood this will help address the problem. Goals of post-operative recovery were discussed as well. Possibility that this will not correct all symptoms was explained. I stressed the importance of low-impact activity, aggressive pain control, avoiding constipation, & not pushing through pain to minimize risk of post-operative chronic pain or injury. Possibility of reherniation was discussed. We will work to minimize complications.  An educational handout further explaining the pathology & treatment options was given as well. Questions were answered. The patient expresses understanding & wishes to proceed with surgery.  Ardeth Sportsman, M.D., F.A.C.S. Gastrointestinal and Minimally Invasive Surgery [facility] [address], [address] [place], [place] [address] [phone] Main / Paging

## 2015-03-27 ENCOUNTER — Encounter: Payer: Self-pay | Admitting: Family Medicine

## 2015-05-07 ENCOUNTER — Encounter: Payer: Self-pay | Admitting: Family Medicine

## 2015-05-15 NOTE — Patient Instructions (Addendum)
Danny BoerRalph Brewer  05/15/2015   Your procedure is scheduled on:   05/27/2015    Report to [facility] Main  Entrance take [place]  elevators to 3rd floor to  Short Stay Center at     0530 AM.  Call this number if you have problems the morning of surgery [phone]   Remember: ONLY 1 PERSON MAY GO WITH YOU TO SHORT STAY TO GET  READY MORNING OF YOUR SURGERY.  Do not eat food or drink liquids :After Midnight.     Take these medicines the morning of surgery with A SIP OF WATER: none                                 You may not have any metal on your body including hair pins and              piercings  Do not wear jewelry, , lotions, powders or perfumes, deodorant                     Men may shave face and neck.   Do not bring valuables to the hospital. [facility] IS NOT             RESPONSIBLE   FOR VALUABLES.  Contacts, dentures or bridgework may not be worn into surgery.       Patients discharged the day of surgery will not be allowed to drive home.  Name and phone number of your driver:  Special Instructions: coughing and deep breathing exercises, leg exercises               Please read over the following fact sheets you were given: _____________________________________________________________________             [facility] - Preparing for Surgery Before surgery, you can play an important role.  Because skin is not sterile, your skin needs to be as free of germs as possible.  You can reduce the number of germs on your skin by washing with CHG (chlorahexidine gluconate) soap before surgery.  CHG is an antiseptic cleaner which kills germs and bonds with the skin to continue killing germs even after washing. Please DO NOT use if you have an allergy to CHG or antibacterial soaps.  If your skin becomes reddened/irritated stop using the CHG and inform your nurse when you arrive at Short Stay. Do not shave (including legs and underarms) for at least 48 hours prior  to the first CHG shower.  You may shave your face/neck. Please follow these instructions carefully:  1.  Shower with CHG Soap the night before surgery and the  morning of Surgery.  2.  If you choose to wash your hair, wash your hair first as usual with your  normal  shampoo.  3.  After you shampoo, rinse your hair and body thoroughly to remove the  shampoo.                           4.  Use CHG as you would any other liquid soap.  You can apply chg directly  to the skin and wash                       Gently with a scrungie or clean washcloth.  5.  Apply the CHG Soap to your body ONLY FROM THE NECK DOWN.   Do not use on face/ open                           Wound or open sores. Avoid contact with eyes, ears mouth and genitals (private parts).                       Wash face,  Genitals (private parts) with your normal soap.             6.  Wash thoroughly, paying special attention to the area where your surgery  will be performed.  7.  Thoroughly rinse your body with warm water from the neck down.  8.  DO NOT shower/wash with your normal soap after using and rinsing off  the CHG Soap.                9.  Pat yourself dry with a clean towel.            10.  Wear clean pajamas.            11.  Place clean sheets on your bed the night of your first shower and do not  sleep with pets. Day of Surgery : Do not apply any lotions/deodorants the morning of surgery.  Please wear clean clothes to the hospital/surgery center.  FAILURE TO FOLLOW THESE INSTRUCTIONS MAY RESULT IN THE CANCELLATION OF YOUR SURGERY PATIENT SIGNATURE_________________________________  NURSE SIGNATURE__________________________________  ________________________________________________________________________

## 2015-05-16 ENCOUNTER — Encounter (HOSPITAL_COMMUNITY): Payer: Self-pay

## 2015-05-16 ENCOUNTER — Encounter (HOSPITAL_COMMUNITY)
Admission: RE | Admit: 2015-05-16 | Discharge: 2015-05-16 | Disposition: A | Discharge status: Home / Self Care | Payer: BC Managed Care – PPO | Source: Ambulatory Visit | Attending: Surgery | Admitting: Surgery

## 2015-05-16 DIAGNOSIS — Z01818 Encounter for other preprocedural examination: Secondary | ICD-10-CM | POA: Insufficient documentation

## 2015-05-16 DIAGNOSIS — K4090 Unilateral inguinal hernia, without obstruction or gangrene, not specified as recurrent: Secondary | ICD-10-CM | POA: Insufficient documentation

## 2015-05-16 LAB — CBC
HCT: 42.9 % (ref 39.0–52.0)
Hemoglobin: 13.9 g/dL (ref 13.0–17.0)
MCH: 27.8 pg (ref 26.0–34.0)
MCHC: 32.4 g/dL (ref 30.0–36.0)
MCV: 85.8 fL (ref 78.0–100.0)
Platelets: 212 10*3/uL (ref 150–400)
RBC: 5 MIL/uL (ref 4.22–5.81)
RDW: 12.7 % (ref 11.5–15.5)
WBC: 7.5 10*3/uL (ref 4.0–10.5)

## 2015-05-27 ENCOUNTER — Ambulatory Visit (HOSPITAL_COMMUNITY): Payer: BC Managed Care – PPO | Admitting: Anesthesiology

## 2015-05-27 ENCOUNTER — Encounter (HOSPITAL_COMMUNITY): Payer: Self-pay | Admitting: *Deleted

## 2015-05-27 ENCOUNTER — Encounter (HOSPITAL_COMMUNITY)
Admission: RE | Disposition: A | Discharge status: Home / Self Care | Payer: Self-pay | Source: Ambulatory Visit | Attending: Surgery

## 2015-05-27 ENCOUNTER — Ambulatory Visit (HOSPITAL_COMMUNITY)
Admission: RE | Admit: 2015-05-27 | Discharge: 2015-05-27 | Disposition: A | Discharge status: Home / Self Care | Payer: BC Managed Care – PPO | Source: Ambulatory Visit | Attending: Surgery | Admitting: Surgery

## 2015-05-27 DIAGNOSIS — K4090 Unilateral inguinal hernia, without obstruction or gangrene, not specified as recurrent: Secondary | ICD-10-CM | POA: Insufficient documentation

## 2015-05-27 DIAGNOSIS — N400 Enlarged prostate without lower urinary tract symptoms: Secondary | ICD-10-CM | POA: Insufficient documentation

## 2015-05-27 DIAGNOSIS — K219 Gastro-esophageal reflux disease without esophagitis: Secondary | ICD-10-CM | POA: Insufficient documentation

## 2015-05-27 HISTORY — DX: Other complications of anesthesia, initial encounter: T88.59XA

## 2015-05-27 HISTORY — DX: Adverse effect of unspecified anesthetic, initial encounter: T41.45XA

## 2015-05-27 HISTORY — PX: INGUINAL HERNIA REPAIR: SHX194

## 2015-05-27 HISTORY — DX: Unilateral inguinal hernia, without obstruction or gangrene, not specified as recurrent: K40.90

## 2015-05-27 SURGERY — REPAIR, HERNIA, INGUINAL, LAPAROSCOPIC
Anesthesia: General | Site: Groin | Laterality: Bilateral

## 2015-05-27 MED ORDER — NEOSTIGMINE METHYLSULFATE 10 MG/10ML IV SOLN
INTRAVENOUS | Status: AC
  Filled 2015-05-27: qty 1

## 2015-05-27 MED ORDER — ONDANSETRON HCL 4 MG/2ML IJ SOLN
INTRAMUSCULAR | Status: AC
  Filled 2015-05-27: qty 2

## 2015-05-27 MED ORDER — BUPIVACAINE-EPINEPHRINE 0.25% -1:200000 IJ SOLN
INTRAMUSCULAR | Status: AC
  Filled 2015-05-27: qty 1

## 2015-05-27 MED ORDER — ROCURONIUM BROMIDE 100 MG/10ML IV SOLN
INTRAVENOUS | Status: AC
  Filled 2015-05-27: qty 1

## 2015-05-27 MED ORDER — SODIUM CHLORIDE 0.9 % IJ SOLN
INTRAMUSCULAR | Status: AC
  Filled 2015-05-27: qty 10

## 2015-05-27 MED ORDER — HYDROMORPHONE HCL 1 MG/ML IJ SOLN: 0.2500 mg | INTRAMUSCULAR | Status: DC | PRN

## 2015-05-27 MED ORDER — GLYCOPYRROLATE 0.2 MG/ML IJ SOLN
INTRAMUSCULAR | Status: AC
  Filled 2015-05-27: qty 3

## 2015-05-27 MED ORDER — PROMETHAZINE HCL 25 MG/ML IJ SOLN
6.2500 mg | INTRAMUSCULAR | Status: DC | PRN
  Administered 2015-05-27: 6.25 mg via INTRAVENOUS
  Filled 2015-05-27: qty 1

## 2015-05-27 MED ORDER — KETOROLAC TROMETHAMINE 30 MG/ML IJ SOLN
INTRAMUSCULAR | Status: DC | PRN
  Administered 2015-05-27: 30 mg via INTRAVENOUS

## 2015-05-27 MED ORDER — PROPOFOL 10 MG/ML IV BOLUS
INTRAVENOUS | Status: AC
  Filled 2015-05-27: qty 20

## 2015-05-27 MED ORDER — BUPIVACAINE-EPINEPHRINE 0.25% -1:200000 IJ SOLN
INTRAMUSCULAR | Status: DC | PRN
  Administered 2015-05-27: 80 mL

## 2015-05-27 MED ORDER — ATROPINE SULFATE 0.4 MG/ML IJ SOLN
INTRAMUSCULAR | Status: AC
  Filled 2015-05-27: qty 1

## 2015-05-27 MED ORDER — GLYCOPYRROLATE 0.2 MG/ML IJ SOLN
INTRAMUSCULAR | Status: DC | PRN
  Administered 2015-05-27: 0.6 mg via INTRAVENOUS

## 2015-05-27 MED ORDER — LIDOCAINE HCL (CARDIAC) 20 MG/ML IV SOLN
INTRAVENOUS | Status: AC
  Filled 2015-05-27: qty 5

## 2015-05-27 MED ORDER — FENTANYL CITRATE (PF) 100 MCG/2ML IJ SOLN
INTRAMUSCULAR | Status: AC
  Filled 2015-05-27: qty 2

## 2015-05-27 MED ORDER — FENTANYL CITRATE (PF) 250 MCG/5ML IJ SOLN
INTRAMUSCULAR | Status: AC
  Filled 2015-05-27: qty 5

## 2015-05-27 MED ORDER — ONDANSETRON HCL 4 MG/2ML IJ SOLN
INTRAMUSCULAR | Status: DC | PRN
  Administered 2015-05-27: 4 mg via INTRAVENOUS

## 2015-05-27 MED ORDER — CHLORHEXIDINE GLUCONATE 4 % EX LIQD
1.0000 | Freq: Once | CUTANEOUS | Status: DC
Start: 2015-05-28 — End: 2015-05-27

## 2015-05-27 MED ORDER — LACTATED RINGERS IV SOLN: INTRAVENOUS | Status: DC

## 2015-05-27 MED ORDER — PROPOFOL 10 MG/ML IV BOLUS
INTRAVENOUS | Status: DC | PRN
  Administered 2015-05-27: 200 mg via INTRAVENOUS

## 2015-05-27 MED ORDER — MIDAZOLAM HCL 2 MG/2ML IJ SOLN
INTRAMUSCULAR | Status: AC
  Filled 2015-05-27: qty 2

## 2015-05-27 MED ORDER — MIDAZOLAM HCL 5 MG/5ML IJ SOLN
INTRAMUSCULAR | Status: DC | PRN
  Administered 2015-05-27: 2 mg via INTRAVENOUS

## 2015-05-27 MED ORDER — FENTANYL CITRATE (PF) 100 MCG/2ML IJ SOLN
25.0000 ug | INTRAMUSCULAR | Status: DC | PRN
  Administered 2015-05-27 (×2): 50 ug via INTRAVENOUS

## 2015-05-27 MED ORDER — NEOSTIGMINE METHYLSULFATE 10 MG/10ML IV SOLN
INTRAVENOUS | Status: DC | PRN
  Administered 2015-05-27: 4 mg via INTRAVENOUS

## 2015-05-27 MED ORDER — DEXAMETHASONE SODIUM PHOSPHATE 10 MG/ML IJ SOLN
INTRAMUSCULAR | Status: DC | PRN
  Administered 2015-05-27: 10 mg via INTRAVENOUS

## 2015-05-27 MED ORDER — LIDOCAINE HCL (PF) 2 % IJ SOLN
INTRAMUSCULAR | Status: DC | PRN
  Administered 2015-05-27: 75 mg via INTRADERMAL

## 2015-05-27 MED ORDER — OXYCODONE HCL 5 MG PO TABS
5.0000 mg | ORAL_TABLET | Freq: Once | ORAL | Status: AC
  Administered 2015-05-27: 5 mg via ORAL
  Filled 2015-05-27: qty 1

## 2015-05-27 MED ORDER — 0.9 % SODIUM CHLORIDE (POUR BTL) OPTIME
TOPICAL | Status: DC | PRN
  Administered 2015-05-27: 1000 mL

## 2015-05-27 MED ORDER — CHLORHEXIDINE GLUCONATE 4 % EX LIQD: 1.0000 "application " | Freq: Once | CUTANEOUS | Status: DC

## 2015-05-27 MED ORDER — CEFAZOLIN SODIUM-DEXTROSE 2-3 GM-% IV SOLR
2.0000 g | INTRAVENOUS | Status: AC
  Administered 2015-05-27: 2 g via INTRAVENOUS

## 2015-05-27 MED ORDER — OXYCODONE HCL 5 MG PO TABS: 5.0000 mg | ORAL_TABLET | ORAL | Status: DC | PRN

## 2015-05-27 MED ORDER — LACTATED RINGERS IV SOLN
INTRAVENOUS | Status: DC | PRN
  Administered 2015-05-27: 07:00:00 via INTRAVENOUS

## 2015-05-27 MED ORDER — CEFAZOLIN SODIUM-DEXTROSE 2-3 GM-% IV SOLR
INTRAVENOUS | Status: AC
  Filled 2015-05-27: qty 50

## 2015-05-27 MED ORDER — ROCURONIUM BROMIDE 100 MG/10ML IV SOLN
INTRAVENOUS | Status: DC | PRN
  Administered 2015-05-27: 60 mg via INTRAVENOUS
  Administered 2015-05-27: 10 mg via INTRAVENOUS

## 2015-05-27 MED ORDER — NAPROXEN 500 MG PO TABS: 500.0000 mg | ORAL_TABLET | Freq: Two times a day (BID) | ORAL | Status: DC

## 2015-05-27 MED ORDER — EPHEDRINE SULFATE 50 MG/ML IJ SOLN
INTRAMUSCULAR | Status: AC
  Filled 2015-05-27: qty 1

## 2015-05-27 MED ORDER — FENTANYL CITRATE (PF) 100 MCG/2ML IJ SOLN
INTRAMUSCULAR | Status: DC | PRN
  Administered 2015-05-27: 50 ug via INTRAVENOUS
  Administered 2015-05-27: 100 ug via INTRAVENOUS
  Administered 2015-05-27 (×2): 50 ug via INTRAVENOUS

## 2015-05-27 MED ORDER — DEXAMETHASONE SODIUM PHOSPHATE 10 MG/ML IJ SOLN
INTRAMUSCULAR | Status: AC
  Filled 2015-05-27: qty 1

## 2015-05-27 MED ORDER — BUPIVACAINE-EPINEPHRINE (PF) 0.25% -1:200000 IJ SOLN
INTRAMUSCULAR | Status: AC
  Filled 2015-05-27: qty 30

## 2015-05-27 SURGICAL SUPPLY — 30 items
CABLE HIGH FREQUENCY MONO STRZ (ELECTRODE) ×2 IMPLANT
CHLORAPREP W/TINT 26ML (MISCELLANEOUS) ×2 IMPLANT
COVER SURGICAL LIGHT HANDLE (MISCELLANEOUS) ×2 IMPLANT
DECANTER SPIKE VIAL GLASS SM (MISCELLANEOUS) ×2 IMPLANT
DEVICE SECURE STRAP 25 ABSORB (INSTRUMENTS) IMPLANT
DRAPE LAPAROSCOPIC ABDOMINAL (DRAPES) ×2 IMPLANT
DRAPE WARM FLUID 44X44 (DRAPE) ×2 IMPLANT
DRSG TEGADERM 2-3/8X2-3/4 SM (GAUZE/BANDAGES/DRESSINGS) ×2 IMPLANT
DRSG TEGADERM 4X4.75 (GAUZE/BANDAGES/DRESSINGS) ×2 IMPLANT
ELECT REM PT RETURN 9FT ADLT (ELECTROSURGICAL) ×2
ELECTRODE REM PT RTRN 9FT ADLT (ELECTROSURGICAL) ×1 IMPLANT
GAUZE SPONGE 2X2 8PLY STRL LF (GAUZE/BANDAGES/DRESSINGS) ×1 IMPLANT
GLOVE ECLIPSE 8.0 STRL XLNG CF (GLOVE) ×2 IMPLANT
GLOVE INDICATOR 8.0 STRL GRN (GLOVE) ×2 IMPLANT
GOWN STRL REUS W/TWL XL LVL3 (GOWN DISPOSABLE) ×6 IMPLANT
KIT BASIN OR (CUSTOM PROCEDURE TRAY) ×2 IMPLANT
MESH ULTRAPRO 6X6 15CM15CM (Mesh General) ×4 IMPLANT
PEN SKIN MARKING BROAD (MISCELLANEOUS) ×2 IMPLANT
SCISSORS LAP 5X35 DISP (ENDOMECHANICALS) ×2 IMPLANT
SET IRRIG TUBING LAPAROSCOPIC (IRRIGATION / IRRIGATOR) IMPLANT
SLEEVE XCEL OPT CAN 5 100 (ENDOMECHANICALS) ×2 IMPLANT
SPONGE GAUZE 2X2 STER 10/PKG (GAUZE/BANDAGES/DRESSINGS) ×1
SUT MNCRL AB 4-0 PS2 18 (SUTURE) ×2 IMPLANT
SUT VIC AB 3-0 SH 27 (SUTURE)
SUT VIC AB 3-0 SH 27XBRD (SUTURE) IMPLANT
TACKER 5MM HERNIA 3.5CML NAB (ENDOMECHANICALS) IMPLANT
TOWEL OR 17X26 10 PK STRL BLUE (TOWEL DISPOSABLE) ×2 IMPLANT
TRAY LAPAROSCOPIC (CUSTOM PROCEDURE TRAY) ×2 IMPLANT
TROCAR BLADELESS OPT 5 100 (ENDOMECHANICALS) ×2 IMPLANT
TROCAR XCEL BLUNT TIP 100MML (ENDOMECHANICALS) ×2 IMPLANT

## 2015-05-27 NOTE — Anesthesia Procedure Notes (Signed)
Procedure Name: Intubation Date/Time: 05/27/2015 7:40 AM Performed by: Elyn PeersALLEN, Janyce Ellinger J Pre-anesthesia Checklist: Patient identified, Emergency Drugs available, Suction available, Patient being monitored and Timeout performed Patient Re-evaluated:Patient Re-evaluated prior to inductionOxygen Delivery Method: Circle system utilized Preoxygenation: Pre-oxygenation with 100% oxygen Intubation Type: IV induction Ventilation: Mask ventilation without difficulty Laryngoscope Size: Miller and 3 Grade View: Grade II Tube type: Oral Tube size: 7.5 mm Number of attempts: 1 Airway Equipment and Method: Stylet Placement Confirmation: ETT inserted through vocal cords under direct vision,  positive ETCO2 and breath sounds checked- equal and bilateral Secured at: 22 cm Tube secured with: Tape Dental Injury: Teeth and Oropharynx as per pre-operative assessment

## 2015-05-27 NOTE — Op Note (Signed)
05/27/2015  9:13 AM  PATIENT:  Danny Brewer  44 y.o. male  Patient Care Team: Donita BrooksWarren T Pickard, MD as PCP - General (Family Medicine)  PRE-OPERATIVE DIAGNOSIS:  Right Inguinal Hernia  POST-OPERATIVE DIAGNOSIS:  BILATERAL INGUINAL HERNIAS  PROCEDURE:   LAPAROSCOPIC BILATERAL  INGUINAL HERNIA REPAIRS WITH MESH  SURGEON:  Surgeon(s): Karie SodaSteven Sable Knoles, MD  ASSISTANT: Liborio NixonJanice RNFA   ANESTHESIA:   Regional ilioinguinal and genitofemoral and spermatic cord nerve blocks with GETA  EBL:     Delay start of Pharmacological VTE agent (>24hrs) due to surgical blood loss or risk of bleeding:  no  DRAINS: NONE  SPECIMEN:  NONE  DISPOSITION OF SPECIMEN:  N/A  COUNTS:  YES  PLAN OF CARE: Discharge to home after PACU  PATIENT DISPOSITION:  PACU - hemodynamically stable.  INDICATION: Patient with symptomatic right inguinal.hernia & mild left groin laxity.  I recommended laparoscopic exploration with repair of hernias found  The anatomy & physiology of the abdominal wall and pelvic floor was discussed.  The pathophysiology of hernias in the inguinal and pelvic region was discussed.  Natural history risks such as progressive enlargement, pain, incarceration & strangulation was discussed.   Contributors to complications such as smoking, obesity, diabetes, prior surgery, etc were discussed.    I feel the risks of no intervention will lead to serious problems that outweigh the operative risks; therefore, I recommended surgery to reduce and repair the hernia.  I explained laparoscopic techniques with possible need for an open approach.  I noted usual use of mesh to patch and/or buttress hernia repair  Risks such as bleeding, infection, abscess, need for further treatment, heart attack, death, and other risks were discussed.  I noted a good likelihood this will help address the problem.   Goals of post-operative recovery were discussed as well.  Possibility that this will not correct all symptoms was  explained.  I stressed the importance of low-impact activity, aggressive pain control, avoiding constipation, & not pushing through pain to minimize risk of post-operative chronic pain or injury. Possibility of reherniation was discussed.  We will work to minimize complications.     An educational handout further explaining the pathology & treatment options was given as well.  Questions were answered.  The patient expresses understanding & wishes to proceed with surgery.  OR FINDINGS: Bilateral inguinal hernias, indirect R>L.  No direct or obturator hernias.  Mild femoral laxity but no true femoral hernias  DESCRIPTION:   The patient was identified & brought into the operating room. The patient was positioned supine with arms tucked. SCDs were active during the entire case. The patient underwent general anesthesia without any difficulty.  The abdomen was prepped and draped in a sterile fashion. The patient's bladder was emptied.  A Surgical Timeout confirmed our plan.  I made a transverse incision through the inferior umbilical fold.  I made a small transverse nick through the anterior rectus fascia contralateral to the inguinal hernia side and placed a 0-vicryl stitch through the fascia.  I placed a Hasson trocar into the preperitoneal plane.  Entry was clean.  We induced carbon dioxide insufflation. Camera inspection revealed no injury.  I used a 10mm angled scope to bluntly free the peritoneum off the infraumbilical anterior abdominal wall.  I created enough of a preperitoneal pocket to place 5mm ports into the right & left mid-abdomen into this preperitoneal cavity.  I focused attention on the right side since that was the dominant hernia side.   I used  blunt & focused sharp dissection to free the peritoneum off the flank and down to the pubic rim.  I freed the anteriolateral bladder wall off the anteriolateral pelvic wall, sparing midline attachments.   I located a swath of peritoneum going into a  hernia fascial defect at the internal ring consistent with an indirect inguinal hernia.  I gradually freed the peritoneal hernia sac off safely and reduced it into the preperitoneal space.  I freed the peritoneum off the spermatic vessels & vas deferens.  I freed peritoneum off the retroperitoneum along the psoas muscle.    I did high ligation of the redundant peritoneal hernia sac, closing peritoneum.  I checked & assured hemostasis.    I turned attention on the opposite side.  I did dissection in a similar, mirror-image fashion. The patient had a smaller but definite indirect inguinal hernia.  No suturing/sac ligation needed  I chose 15x15 cm sheets of ultra-lightweight polypropylene mesh (Ultrapro), one for each side.  I cut a single sigmoid-shaped slit ~6cm from a corner of each mesh.  I placed the meshes into the preperitoneal space & laid them as overlapping diamonds such that at the inferior points, a 6x6 cm corner flap rested in the true anterolateral pelvis, covering the obturator & femoral foramina.   I allowed the bladder to fall back and help tuck the corners of the mesh in.  The medial corners overlapped each other across midline cephalad to the pubic rim.   This provided >2 inch coverage around the hernia.  He had a muscular abdominal wall with a narrow pelvis.  Because the defects well covered and not particularly large, I did not need tacks to hold the mesh in place  I held the hernia sacs cephalad & evacuated carbon dioxide.  I closed the fascia  With absorbable suture.  I closed the skin using 4-0 monocryl stitch.  Sterile dressings were applied. The patient was extubated & arrived in the PACU in stable condition..  I had discussed postoperative care with the patient in the holding area.   I did discuss operative findings and postoperative goals / instructions to his wife as well.  Instructions are written in the chart.  Ardeth Sportsman, M.D., F.A.C.S. Gastrointestinal and Minimally  Invasive Surgery [facility] [address], [address] [place], [place] [address] [phone] Main / Paging

## 2015-05-27 NOTE — Progress Notes (Signed)
Coming out of bed, requiring two people to keep him on the stretcher. Not following commands. Not focusing on speaker.

## 2015-05-27 NOTE — Discharge Instructions (Signed)
HERNIA REPAIR: POST OP INSTRUCTIONS ° °1. DIET: Follow a light bland diet the first 24 hours after arrival home, such as soup, liquids, crackers, etc.  Be sure to include lots of fluids daily.  Avoid fast food or heavy meals as your are more likely to get nauseated.  Eat a low fat the next few days after surgery. °2. Take your usually prescribed home medications unless otherwise directed. °3. PAIN CONTROL: °a. Pain is best controlled by a usual combination of three different methods TOGETHER: °i. Ice/Heat °ii. Over the counter pain medication °iii. Prescription pain medication °b. Most patients will experience some swelling and bruising around the hernia(s) such as the bellybutton, groins, or old incisions.  Ice packs or heating pads (30-60 minutes up to 6 times a day) will help. Use ice for the first few days to help decrease swelling and bruising, then switch to heat to help relax tight/sore spots and speed recovery.  Some people prefer to use ice alone, heat alone, alternating between ice & heat.  Experiment to what works for you.  Swelling and bruising can take several weeks to resolve.   °c. It is helpful to take an over-the-counter pain medication regularly for the first few weeks.  Choose one of the following that works best for you: °i. Naproxen (Aleve, etc)  Two 220mg tabs twice a day °ii. Ibuprofen (Advil, etc) Three 200mg tabs four times a day (every meal & bedtime) °iii. Acetaminophen (Tylenol, etc) 325-650mg four times a day (every meal & bedtime) °d. A  prescription for pain medication should be given to you upon discharge.  Take your pain medication as prescribed.  °i. If you are having problems/concerns with the prescription medicine (does not control pain, nausea, vomiting, rash, itching, etc), please call us [phone] to see if we need to switch you to a different pain medicine that will work better for you and/or control your side effect better. °ii. If you need a refill on your pain  medication, please contact your pharmacy.  They will contact our office to request authorization. Prescriptions will not be filled after 5 pm or on week-ends. °4. Avoid getting constipated.  Between the surgery and the pain medications, it is common to experience some constipation.  Increasing fluid intake and taking a fiber supplement (such as Metamucil, Citrucel, FiberCon, MiraLax, etc) 1-2 times a day regularly will usually help prevent this problem from occurring.  A mild laxative (prune juice, Milk of Magnesia, MiraLax, etc) should be taken according to package directions if there are no bowel movements after 48 hours.   °5. Wash / shower every day.  You may shower over the dressings as they are waterproof.   °6. Remove your waterproof bandages 5 days after surgery.  You may leave the incision open to air.  You may replace a dressing/Band-Aid to cover the incision for comfort if you wish.  Continue to shower over incision(s) after the dressing is off. ° ° ° °7. ACTIVITIES as tolerated:   °a. You may resume regular (light) daily activities beginning the next day--such as daily self-care, walking, climbing stairs--gradually increasing activities as tolerated.  If you can walk 30 minutes without difficulty, it is safe to try more intense activity such as jogging, treadmill, bicycling, low-impact aerobics, swimming, etc. °b. Save the most intensive and strenuous activity for last such as sit-ups, heavy lifting, contact sports, etc  Refrain from any heavy lifting or straining until you are off narcotics for pain control.   °  c. DO NOT PUSH THROUGH PAIN.  Let pain be your guide: If it hurts to do something, don't do it.  Pain is your body warning you to avoid that activity for another week until the pain goes down. °d. You may drive when you are no longer taking prescription pain medication, you can comfortably wear a seatbelt, and you can safely maneuver your car and apply brakes. °e. You may have sexual intercourse  when it is comfortable.  °8. FOLLOW UP in our office °a. Please call [facility] at [phone] to set up an appointment to see your surgeon in the office for a follow-up appointment approximately 2-3 weeks after your surgery. °b. Make sure that you call for this appointment the day you arrive home to insure a convenient appointment time. °9.  IF YOU HAVE DISABILITY OR FAMILY LEAVE FORMS, BRING THEM TO THE OFFICE FOR PROCESSING.  DO NOT GIVE THEM TO YOUR DOCTOR. ° °WHEN TO CALL US [phone]: °1. Poor pain control °2. Reactions / problems with new medications (rash/itching, nausea, etc)  °3. Fever over 101.5 F (38.5 C) °4. Inability to urinate °5. Nausea and/or vomiting °6. Worsening swelling or bruising °7. Continued bleeding from incision. °8. Increased pain, redness, or drainage from the incision ° ° The clinic staff is available to answer your questions during regular business hours (8:30am-5pm).  Please don’t hesitate to call and ask to speak to one of our nurses for clinical concerns.  ° If you have a medical emergency, go to the nearest emergency room or call 911. ° A surgeon from [facility] is always on call at the hospitals in [place] ° °[facility] °[address], [address], [place], [place]  [address] ? ° [address] [address], [place], [place]   [address] °MAIN: [phone] ? TOLL FREE: [phone] ? FAX: [phone] °[email or web address] ° °Managing Pain ° °Pain after surgery or related to activity is often due to strain/injury to muscle, tendon, nerves and/or incisions.  This pain is usually short-term and will improve in a few months.  ° °Many people find it helpful to do the following things TOGETHER to help speed the process of healing and to get back to regular activity more quickly: ° °1. Avoid heavy physical activity at first °a. No lifting greater than 20 pounds at first, then increase to lifting as tolerated over the next few weeks °b. Do not “push  through” the pain.  Listen to your body and avoid positions and maneuvers than reproduce the pain.  Wait a few days before trying something more intense °c. Walking is okay as tolerated, but go slowly and stop when getting sore.  If you can walk 30 minutes without stopping or pain, you can try more intense activity (running, jogging, aerobics, cycling, swimming, treadmill, sex, sports, weightlifting, etc ) °d. Remember: If it hurts to do it, then don’t do it! ° °2. Take Anti-inflammatory medication °a. Choose ONE of the following over-the-counter medications: °i.            Acetaminophen 500mg tabs (Tylenol) 1-2 pills with every meal and just before bedtime (avoid if you have liver problems) °ii.            Naproxen 220mg tabs (ex. Aleve) 1-2 pills twice a day (avoid if you have kidney, stomach, IBD, or bleeding problems) °iii. Ibuprofen 200mg tabs (ex. Advil, Motrin) 3-4 pills with every meal and just before bedtime (avoid if you have kidney, stomach, IBD, or bleeding   problems) °b. Take with food/snack around the clock for 1-2 weeks °i. This helps the muscle and nerve tissues become less irritable and calm down faster ° °3. Use a Heating pad or Ice/Cold Pack °a. 4-6 times a day °b. May use warm bath/hottub  or showers ° °4. Try Gentle Massage and/or Stretching  °a. at the area of pain many times a day °b. stop if you feel pain - do not overdo it ° °Try these steps together to help you body heal faster and avoid making things get worse.  Doing just one of these things may not be enough.   ° °If you are not getting better after two weeks or are noticing you are getting worse, contact our office for further advice; we may need to re-evaluate you & see what other things we can do to help. ° °GETTING TO GOOD BOWEL HEALTH. °Irregular bowel habits such as constipation and diarrhea can lead to many problems over time.  Having one soft bowel movement a day is the most important way to prevent further problems.  The  anorectal canal is designed to handle stretching and feces to safely manage our ability to get rid of solid waste (feces, poop, stool) out of our body.  BUT, hard constipated stools can act like ripping concrete bricks and diarrhea can be a burning fire to this very sensitive area of our body, causing inflamed hemorrhoids, anal fissures, increasing risk is perirectal abscesses, abdominal pain/bloating, an making irritable bowel worse.     ° °The goal: ONE SOFT BOWEL MOVEMENT A DAY!  To have soft, regular bowel movements:  °• Drink plenty of fluids, consider 4-6 tall glasses of water a day.   °• Take plenty of fiber.  Fiber is the undigested part of plant food that passes into the colon, acting s “natures broom” to encourage bowel motility and movement.  Fiber can absorb and hold large amounts of water. This results in a larger, bulkier stool, which is soft and easier to pass. Work gradually over several weeks up to 6 servings a day of fiber (25g a day even more if needed) in the form of: °o Vegetables -- Root (potatoes, carrots, turnips), leafy green (lettuce, salad greens, celery, spinach), or cooked high residue (cabbage, broccoli, etc) °o Fruit -- Fresh (unpeeled skin & pulp), Dried (prunes, apricots, cherries, etc ),  or stewed ( applesauce)  °o Whole grain breads, pasta, etc (whole wheat)  °o Bran cereals  °• Bulking Agents -- This type of water-retaining fiber generally is easily obtained each day by one of the following:  °o Psyllium bran -- The psyllium plant is remarkable because its ground seeds can retain so much water. This product is available as Metamucil, Konsyl, Effersyllium, Per Diem Fiber, or the less expensive generic preparation in drug and health food stores. Although labeled a laxative, it really is not a laxative.  °o Methylcellulose -- This is another fiber derived from wood which also retains water. It is available as Citrucel. °o Polyethylene Glycol - and “artificial” fiber commonly called  Miralax or Glycolax.  It is helpful for people with gassy or bloated feelings with regular fiber °o Flax Seed - a less gassy fiber than psyllium °• No reading or other relaxing activity while on the toilet. If bowel movements take longer than 5 minutes, you are too constipated °• AVOID CONSTIPATION.  High fiber and water intake usually takes care of this.  Sometimes a laxative is needed to stimulate more frequent   bowel movements, but   Laxatives are not a good long-term solution as it can wear the colon out.  They can help jump-start bowels if constipated, but should be relied on constantly without discussing with your doctor o Osmotics (Milk of Magnesia, Fleets phosphosoda, Magnesium citrate, MiraLax, GoLytely) are safer than  o Stimulants (Senokot, Castor Oil, Dulcolax, Ex Lax)    o Avoid taking laxatives for more than 7 days in a row.   IF SEVERELY CONSTIPATED, try a Bowel Retraining Program: o Do not use laxatives.  o Eat a diet high in roughage, such as bran cereals and leafy vegetables.  o Drink six (6) ounces of prune or apricot juice each morning.  o Eat two (2) large servings of stewed fruit each day.  o Take one (1) heaping tablespoon of a psyllium-based bulking agent twice a day. Use sugar-free sweetener when possible to avoid excessive calories.  o Eat a normal breakfast.  o Set aside 15 minutes after breakfast to sit on the toilet, but do not strain to have a bowel movement.  o If you do not have a bowel movement by the third day, use an enema and repeat the above steps.   Controlling diarrhea o Switch to liquids and simpler foods for a few days to avoid stressing your intestines further. o Avoid dairy products (especially milk & ice cream) for a short time.  The intestines often can lose the ability to digest lactose when stressed. o Avoid foods that cause gassiness or bloating.  Typical foods include beans and other legumes, cabbage, broccoli, and dairy foods.  Every person has  some sensitivity to other foods, so listen to our body and avoid those foods that trigger problems for you. o Adding fiber (Citrucel, Metamucil, psyllium, Miralax) gradually can help thicken stools by absorbing excess fluid and retrain the intestines to act more normally.  Slowly increase the dose over a few weeks.  Too much fiber too soon can backfire and cause cramping & bloating. o Probiotics (such as active yogurt, Align, etc) may help repopulate the intestines and colon with normal bacteria and calm down a sensitive digestive tract.  Most studies show it to be of mild help, though, and such products can be costly. o Medicines: - Bismuth subsalicylate (ex. Kayopectate, Pepto Bismol) every 30 minutes for up to 6 doses can help control diarrhea.  Avoid if pregnant. - Loperamide (Immodium) can slow down diarrhea.  Start with two tablets (4mg  total) first and then try one tablet every 6 hours.  Avoid if you are having fevers or severe pain.  If you are not better or start feeling worse, stop all medicines and call your doctor for advice o Call your doctor if you are getting worse or not better.  Sometimes further testing (cultures, endoscopy, X-ray studies, bloodwork, etc) may be needed to help diagnose and treat the cause of the diarrhea.  TROUBLESHOOTING IRREGULAR BOWELS 1) Avoid extremes of bowel movements (no bad constipation/diarrhea) 2) Miralax 17gm mixed in 8oz. water or juice-daily. May use BID as needed.  3) Gas-x,Phazyme, etc. as needed for gas & bloating.  4) Soft,bland diet. No spicy,greasy,fried foods.  5) Prilosec over-the-counter as needed  6) May hold gluten/wheat products from diet to see if symptoms improve.  7)  May try probiotics (Align, Activa, etc) to help calm the bowels down 7) If symptoms become worse call back immediately.  General Anesthesia, Adult, Care After Refer to this sheet in the next few weeks. These instructions  provide you with information on caring for  yourself after your procedure. Your health care provider may also give you more specific instructions. Your treatment has been planned according to current medical practices, but problems sometimes occur. Call your health care provider if you have any problems or questions after your procedure. WHAT TO EXPECT AFTER THE PROCEDURE After the procedure, it is typical to experience:  Sleepiness.  Nausea and vomiting. HOME CARE INSTRUCTIONS  For the first 24 hours after general anesthesia:  Have a responsible person with you.  Do not drive a car. If you are alone, do not take public transportation.  Do not drink alcohol.  Do not take medicine that has not been prescribed by your health care provider.  Do not sign important papers or make important decisions.  You may resume a normal diet and activities as directed by your health care provider.  Change bandages (dressings) as directed.  If you have questions or problems that seem related to general anesthesia, call the hospital and ask for the anesthetist or anesthesiologist on call. SEEK MEDICAL CARE IF:  You have nausea and vomiting that continue the day after anesthesia.  You develop a rash. SEEK IMMEDIATE MEDICAL CARE IF:   You have difficulty breathing.  You have chest pain.  You have any allergic problems.   This information is not intended to replace advice given to you by your health care provider. Make sure you discuss any questions you have with your health care provider.   Document Released: 08/31/2000 Document Revised: 06/15/2014 Document Reviewed: 09/23/2011 Elsevier Interactive Patient Education [employer]

## 2015-05-27 NOTE — Anesthesia Preprocedure Evaluation (Addendum)
Anesthesia Evaluation  Patient identified by MRN, date of birth, ID band Patient awake    Reviewed: Allergy & Precautions, NPO status , Patient's Chart, lab work & pertinent test results  Airway Mallampati: II  TM Distance: >3 FB Neck ROM: Full    Dental  (+) Teeth Intact   Pulmonary neg pulmonary ROS,    breath sounds clear to auscultation       Cardiovascular negative cardio ROS   Rhythm:Regular Rate:Normal     Neuro/Psych negative neurological ROS  negative psych ROS   GI/Hepatic Neg liver ROS, GERD  ,  Endo/Other  negative endocrine ROS  Renal/GU   negative genitourinary   Musculoskeletal negative musculoskeletal ROS (+)   Abdominal   Peds negative pediatric ROS (+)  Hematology negative hematology ROS (+)   Anesthesia Other Findings   Reproductive/Obstetrics negative OB ROS                            Lab Results  Component Value Date   WBC 7.5 05/16/2015   HGB 13.9 05/16/2015   HCT 42.9 05/16/2015   MCV 85.8 05/16/2015   PLT 212 05/16/2015   Lab Results  Component Value Date   CREATININE 0.93 12/18/2014   BUN 12 12/18/2014   NA 141 12/18/2014   K 4.0 12/18/2014   CL 106 12/18/2014   CO2 27 12/18/2014   Lab Results  Component Value Date   INR 0.96 10/17/2011   08/2013 EKG: normal sinus rhythm.    Anesthesia Physical Anesthesia Plan  ASA: II  Anesthesia Plan: General   Post-op Pain Management:    Induction: Intravenous  Airway Management Planned: Oral ETT  Additional Equipment:   Intra-op Plan:   Post-operative Plan: Extubation in OR  Informed Consent: I have reviewed the patients History and Physical, chart, labs and discussed the procedure including the risks, benefits and alternatives for the proposed anesthesia with the patient or authorized representative who has indicated his/her understanding and acceptance.   Dental advisory given  Plan  Discussed with: CRNA  Anesthesia Plan Comments:         Anesthesia Quick Evaluation

## 2015-05-27 NOTE — Transfer of Care (Signed)
Immediate Anesthesia Transfer of Care Note  Patient: Danny BoerRalph Brewer  Procedure(s) Performed: Procedure(s): LAPAROSCOPIC BILATERAL  INGUINAL HERNIA REPAIRS WITH MESH (Bilateral)  Patient Location: PACU  Anesthesia Type:General  Level of Consciousness: awake  Airway & Oxygen Therapy: Patient Spontanous Breathing  Post-op Assessment: Report given to RN  Post vital signs: Reviewed  Last Vitals:  Filed Vitals:   05/27/15 0518  BP: 140/81  Pulse: 87  Temp: 36.5 C  Resp: 18    Complications: No apparent anesthesia complications

## 2015-05-27 NOTE — Transfer of Care (Signed)
Immediate Anesthesia Transfer of Care Note  Patient: Danny Brewer  Procedure(s) Performed: Procedure(s): LAPAROSCOPIC RIGHT INGUINAL HERNIA REPAIR WITH MESH (Right)  Patient Location: PACU  Anesthesia Type:General  Level of Consciousness: awake  Airway & Oxygen Therapy: Patient Spontanous Breathing and Patient connected to face mask oxygen  Post-op Assessment: Report given to RN and Post -op Vital signs reviewed and stable  Post vital signs: Reviewed and stable  Last Vitals:  Filed Vitals:   05/27/15 0518  BP: 140/81  Pulse: 87  Temp: 36.5 C  Resp: 18    Complications: No apparent anesthesia complications and patient initially confused and moving around on bed but settled down and was cooperative within a few minutes.

## 2015-05-27 NOTE — Anesthesia Postprocedure Evaluation (Signed)
Anesthesia Post Note  Patient: Danny Brewer  Procedure(s) Performed: Procedure(s) (LRB): LAPAROSCOPIC BILATERAL  INGUINAL HERNIA REPAIRS WITH MESH (Bilateral)  Patient location during evaluation: PACU Anesthesia Type: General Level of consciousness: awake and alert Pain management: pain level controlled Vital Signs Assessment: post-procedure vital signs reviewed and stable Respiratory status: spontaneous breathing, nonlabored ventilation, respiratory function stable and patient connected to nasal cannula oxygen Cardiovascular status: blood pressure returned to baseline and stable Postop Assessment: no signs of nausea or vomiting Anesthetic complications: no    Last Vitals:  Filed Vitals:   05/27/15 1030 05/27/15 1044  BP: 119/67 109/69  Pulse: 80 80  Temp: 36.9 C 37.2 C  Resp: 6     Last Pain:  Filed Vitals:   05/27/15 1054  PainSc: 8                  Shelton SilvasKevin D Heberto Sturdevant

## 2015-05-27 NOTE — H&P (Signed)
Danny Brewer 03/22/2015 2:57 PM Location: [facility] Patient #: [id number] DOB: 17-Feb-1971 Married / Language: English / Race: Black or African American Male   History of Present Illness  Patient words: hernia.  The patient is a 44 year old male who presents with an inguinal hernia. Patient sent for surgical consultation by Dr. Lynnea Ferrier. Concern for increasingly symptomatic RIGHT inguinal hernia. Pleasant male. Comes today with his wife and son. Has noticed some groin discomfort and now a bulge over the past year. Became more bothersome. 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 Because it had become more bothersome to him, the patient and his wife were concerned. His primary care physician agreed. Recommended surgical evaluation. Patient denies any prior abdominal surgery. No hernia surgery. Normally could walk for a mile or 2 without difficulty. On his feet all day. Normally has a bowel movement twice a day but bleeding is gone down to once a day. He wonders if it's related to the lump that is more obvious. Occasionally notes gurgling in the groin. Can reduce it when he lies down. His had a colonoscopy in the past which showed no abnormalities that he can recall. Recently had an episode of some viral pharyngitis that is now resolved.  Still with symptoms.  No new events   Other Problems Danny Brewer, CMA; 03/22/2015 2:58 PM) Back Pain Enlarged Prostate Gastroesophageal Reflux Disease High blood pressure Kidney Stone  Past Surgical History Danny Brewer, CMA; 03/22/2015 2:57 PM) Knee Surgery Left.  Diagnostic Studies History Danny Brewer, CMA; 03/22/2015 2:57 PM) Colonoscopy 1-5 years ago  Allergies Danny Brewer, CMA; 03/22/2015 2:59 PM) Morphine Sulfate (PF) *ANALGESICS - OPIOID* Demerol *ANALGESICS - OPIOID* HYDROmorphone HCl *ANALGESICS - OPIOID* Antihistamine-1 *ANTIHISTAMINES*  Medication History Danny Brewer, CMA;  03/22/2015 2:59 PM) No Current Medications Medications Reconciled  Social History Danny Brewer, CMA; 03/22/2015 2:58 PM) Caffeine use Tea. No alcohol use No drug use Tobacco use Never smoker.  Family History Danny Brewer, CMA; 03/22/2015 2:57 PM) Alcohol Abuse Father. Arthritis Sister. Heart Disease Brother, Father, Sister. Heart disease in male family member before age 88 Heart disease in male family member before age 14 Hypertension Brother. Ischemic Bowel Disease Sister. Respiratory Condition Father. Thyroid problems Brother, Mother.    Review of Systems Danny Brewer CMA; 03/22/2015 2:58 PM) Skin Present- Dryness. Not Present- Change in Wart/Mole, Hives, Jaundice, New Lesions, Non-Healing Wounds, Rash and Ulcer. HEENT Present- Sore Throat and Wears glasses/contact lenses. Not Present- Earache, Hearing Loss, Hoarseness, Nose Bleed, Oral Ulcers, Ringing in the Ears, Seasonal Allergies, Sinus Pain, Visual Disturbances and Yellow Eyes. Respiratory Not Present- Bloody sputum, Chronic Cough, Difficulty Breathing, Snoring and Wheezing. Cardiovascular Not Present- Chest Pain, Difficulty Breathing Lying Down, Leg Cramps, Palpitations, Rapid Heart Rate, Shortness of Breath and Swelling of Extremities. Gastrointestinal Present- Abdominal Pain, Bloating, Change in Bowel Habits, Difficulty Swallowing, Gets full quickly at meals and Hemorrhoids. Not Present- Bloody Stool, Chronic diarrhea, Constipation, Excessive gas, Indigestion, Nausea, Rectal Pain and Vomiting. Male Genitourinary Present- Change in Urinary Stream. Not Present- Blood in Urine, Frequency, Impotence, Nocturia, Painful Urination, Urgency and Urine Leakage. Musculoskeletal Present- Joint Stiffness. Not Present- Back Pain, Joint Pain, Muscle Pain, Muscle Weakness and Swelling of Extremities. Neurological Not Present- Decreased Memory, Fainting, Headaches, Numbness, Seizures, Tingling, Tremor, Trouble walking and  Weakness.  Vitals (Danny Brewer CMA; 03/22/2015 2:58 PM) 03/22/2015 2:58 PM Weight: 174 lb Height: 68in Body Surface Area: 1.95 m Body Mass Index: 26.46 kg/m  Temp.: 10F(Temporal)  Pulse: 79 (Regular)  BP: 126/80 (Sitting, Left Arm, Standard)     Physical Exam Danny Brewer(Danny Brewer C. Axton Cihlar MD; 03/22/2015 3:28 PM) General Mental Status-Alert. General Appearance-Not in acute distress, Not Sickly. Orientation-Oriented X3. Hydration-Well hydrated. Voice-Normal. Note: Pleasant. Chatty. Affable.   Integumentary Global Assessment Upon inspection and palpation of skin surfaces of the - Axillae: non-tender, no inflammation or ulceration, no drainage. and Distribution of scalp and body hair is normal. General Characteristics Temperature - normal warmth is noted.  Head and Neck Head-normocephalic, atraumatic with no lesions or palpable masses. Face Global Assessment - atraumatic, no absence of expression. Neck Global Assessment - no abnormal movements, no bruit auscultated on the right, no bruit auscultated on the left, no decreased range of motion, non-tender. Trachea-midline. Thyroid Gland Characteristics - non-tender.  Eye Eyeball - Left-Extraocular movements intact, No Nystagmus. Eyeball - Right-Extraocular movements intact, No Nystagmus. Cornea - Left-No Hazy. Cornea - Right-No Hazy. Sclera/Conjunctiva - Left-No scleral icterus, No Discharge. Sclera/Conjunctiva - Right-No scleral icterus, No Discharge. Pupil - Left-Direct reaction to light normal. Pupil - Right-Direct reaction to light normal.  ENMT Ears Pinna - Left - no drainage observed, no generalized tenderness observed. Right - no drainage observed, no generalized tenderness observed. Nose and Sinuses External Inspection of the Nose - no destructive lesion observed. Inspection of the nares - Left - quiet respiration. Right - quiet respiration. Mouth and Throat Lips - Upper Lip -  no fissures observed, no pallor noted. Lower Lip - no fissures observed, no pallor noted. Nasopharynx - no discharge present. Oral Cavity/Oropharynx - Tongue - no dryness observed. Oral Mucosa - no cyanosis observed. Hypopharynx - no evidence of airway distress observed.  Chest and Lung Exam Inspection Movements - Normal and Symmetrical. Accessory muscles - No use of accessory muscles in breathing. Palpation Palpation of the chest reveals - Non-tender. Auscultation Breath sounds - Normal and Clear.  Cardiovascular Auscultation Rhythm - Regular. Murmurs & Other Heart Sounds - Auscultation of the heart reveals - No Murmurs and No Systolic Clicks.  Abdomen Inspection Inspection of the abdomen reveals - No Visible peristalsis and No Abnormal pulsations. Umbilicus - No Bleeding, No Urine drainage. Palpation/Percussion Palpation and Percussion of the abdomen reveal - Soft, Non Tender, No Rebound tenderness, No Rigidity (guarding) and No Cutaneous hyperesthesia. Note: Mild diastases recti. No umbilical hernia. Soft. Nontender nondistended.   Male Genitourinary Sexual Maturity Tanner 5 - Adult hair pattern and Adult penile size and shape. Note: Circumcised male. Testes epididymides and cords normal without any concerning masses. Obvious RIGHT groin bulge reducible consistent with inguinal hernia. Mild laxity but no definite hernia on the LEFT side.   Peripheral Vascular Upper Extremity Inspection - Left - No Cyanotic nailbeds, Not Ischemic. Right - No Cyanotic nailbeds, Not Ischemic.  Neurologic Neurologic evaluation reveals -normal attention span and ability to concentrate, able to name objects and repeat phrases. Appropriate fund of knowledge , normal sensation and normal coordination. Mental Status Affect - not angry, not paranoid. Cranial Nerves-Normal Bilaterally. Gait-Normal.  Neuropsychiatric Mental status exam performed with findings of-able to articulate well with  normal speech/language, rate, volume and coherence, thought content normal with ability to perform basic computations and apply abstract reasoning and no evidence of hallucinations, delusions, obsessions or homicidal/suicidal ideation.  Musculoskeletal Global Assessment Spine, Ribs and Pelvis - no instability, subluxation or laxity. Right Upper Extremity - no instability, subluxation or laxity.  Lymphatic Head & Neck  General Head & Neck Lymphatics: Bilateral - Description - No Localized lymphadenopathy. Axillary  General Axillary Region: Bilateral -  Description - No Localized lymphadenopathy. Femoral & Inguinal  Generalized Femoral & Inguinal Lymphatics: Left - Description - No Localized lymphadenopathy. Right - Description - No Localized lymphadenopathy.    Assessment & Plan  RIGHT INGUINAL HERNIA (K40.90) Impression: Obvious RIGHT groin hernia reducible. No definite hernia on the LEFT but he wonders if maybe something could be there as well. I offerred diagnostic laparoscopy and repair of hernias found. They are interested in that.  He's hoping to delay surgery until winter break. Was hoping for mid December. That way he can use 4-6 weeks to recover and minimize PAL time. His wife worries that may be too long but since he is not very symptomatic for thick and can still work with it, reasonable to give that a try with a low threshold to move up sooner if it becomes more symptomatic.  Current Plans You are being scheduled for surgery - Our schedulers will call you.  You should hear from our office's scheduling department within 5 working days about the location, date, and time of surgery. We try to make accommodations for patient's preferences in scheduling surgery, but sometimes the OR schedule or the surgeon's schedule prevents [place] from making those accommodations.  If you have not heard from our office [phone]) in 5 working days, call the office and ask for your surgeon's  nurse.  If you have other questions about your diagnosis, plan, or surgery, call the office and ask for your surgeon's nurse.  Written instructions provided  Pt Education - Pamphlet Given - Laparoscopic Hernia Repair: discussed with patient and provided information.   The anatomy & physiology of the abdominal wall and pelvic floor was discussed. The pathophysiology of hernias in the inguinal and pelvic region was discussed. Natural history risks such as progressive enlargement, pain, incarceration, and strangulation was discussed. Contributors to complications such as smoking, obesity, diabetes, prior surgery, etc were discussed.  I feel the risks of no intervention will lead to serious problems that outweigh the operative risks; therefore, I recommended surgery to reduce and repair the hernia. I explained laparoscopic techniques with possible need for an open approach. I noted usual use of mesh to patch and/or buttress hernia repair  Risks such as bleeding, infection, abscess, need for further treatment, heart attack, death, and other risks were discussed. I noted a good likelihood this will help address the problem. Goals of post-operative recovery were discussed as well. Possibility that this will not correct all symptoms was explained. I stressed the importance of low-impact activity, aggressive pain control, avoiding constipation, & not pushing through pain to minimize risk of post-operative chronic pain or injury. Possibility of reherniation was discussed. We will work to minimize complications.  An educational handout further explaining the pathology & treatment options was given as well. Questions were answered. The patient expresses understanding & wishes to proceed with surgery.  Pt Education - [facility] Hernia Post-Op HCI (Cagney Degrace): discussed with patient and provided information. Pt Education - [facility] Pain Control (Reann Dobias)   The anatomy & physiology of the abdominal wall and pelvic floor was  discussed. The pathophysiology of hernias in the inguinal and pelvic region was discussed. Natural history risks such as progressive enlargement, pain, incarceration, and strangulation was discussed. Contributors to complications such as smoking, obesity, diabetes, prior surgery, etc were discussed.  I feel the risks of no intervention will lead to serious problems that outweigh the operative risks; therefore, I recommended surgery to reduce and repair the hernia. I explained laparoscopic techniques with possible need  for an open approach. I noted usual use of mesh to patch and/or buttress hernia repair  Risks such as bleeding, infection, abscess, need for further treatment, heart attack, death, and other risks were discussed. I noted a good likelihood this will help address the problem. Goals of post-operative recovery were discussed as well. Possibility that this will not correct all symptoms was explained. I stressed the importance of low-impact activity, aggressive pain control, avoiding constipation, & not pushing through pain to minimize risk of post-operative chronic pain or injury. Possibility of reherniation was discussed. We will work to minimize complications.  An educational handout further explaining the pathology & treatment options was given as well. Questions were answered. The patient expresses understanding & wishes to proceed with surgery.  Danny Brewer, M.D., F.A.C.S. Gastrointestinal and Minimally Invasive Surgery [facility] [address], [address] [place], [place] [address] [phone] Main / Paging

## 2015-05-27 NOTE — Interval H&P Note (Signed)
History and Physical Interval Note:  05/27/2015 7:07 AM  Danny Brewer  has presented today for surgery, with the diagnosis of Right Inguinal Hernia  The various methods of treatment have been discussed with the patient and family. After consideration of risks, benefits and other options for treatment, the patient has consented to  Procedure(s): LAPAROSCOPIC RIGHT INGUINAL HERNIA REPAIR WITH MESH (Right) as a surgical intervention .  The patient's history has been reviewed, patient examined, no change in status, stable for surgery.  I have reviewed the patient's chart and labs.  Questions were answered to the patient's satisfaction.     [name]

## 2015-05-28 ENCOUNTER — Encounter (HOSPITAL_COMMUNITY): Payer: Self-pay | Admitting: Surgery

## 2015-06-19 NOTE — Addendum Note (Signed)
Addendum  created 06/19/15 1813 by Shelton SilvasKevin D Hollis, MD   Modules edited: Anesthesia Responsible Staff

## 2015-07-02 ENCOUNTER — Encounter: Payer: Self-pay | Admitting: Family Medicine

## 2015-08-25 ENCOUNTER — Observation Stay (HOSPITAL_COMMUNITY)
Admission: EM | Admit: 2015-08-25 | Discharge: 2015-08-28 | Disposition: A | Discharge status: Home / Self Care | Payer: BC Managed Care – PPO | Attending: Internal Medicine | Admitting: Internal Medicine

## 2015-08-25 ENCOUNTER — Encounter (HOSPITAL_COMMUNITY): Payer: Self-pay | Admitting: Emergency Medicine

## 2015-08-25 ENCOUNTER — Emergency Department (HOSPITAL_COMMUNITY): Payer: BC Managed Care – PPO

## 2015-08-25 DIAGNOSIS — R51 Headache: Secondary | ICD-10-CM | POA: Insufficient documentation

## 2015-08-25 DIAGNOSIS — M549 Dorsalgia, unspecified: Secondary | ICD-10-CM | POA: Diagnosis not present

## 2015-08-25 DIAGNOSIS — R0789 Other chest pain: Secondary | ICD-10-CM | POA: Insufficient documentation

## 2015-08-25 DIAGNOSIS — R410 Disorientation, unspecified: Secondary | ICD-10-CM | POA: Insufficient documentation

## 2015-08-25 DIAGNOSIS — R202 Paresthesia of skin: Principal | ICD-10-CM | POA: Insufficient documentation

## 2015-08-25 DIAGNOSIS — M79609 Pain in unspecified limb: Secondary | ICD-10-CM

## 2015-08-25 DIAGNOSIS — R209 Unspecified disturbances of skin sensation: Secondary | ICD-10-CM

## 2015-08-25 LAB — RAPID URINE DRUG SCREEN, HOSP PERFORMED
Amphetamines: NOT DETECTED
Barbiturates: NOT DETECTED
Benzodiazepines: NOT DETECTED
Cocaine: NOT DETECTED
Opiates: NOT DETECTED
Tetrahydrocannabinol: NOT DETECTED

## 2015-08-25 LAB — COMPREHENSIVE METABOLIC PANEL
ALT: 24 U/L (ref 17–63)
AST: 22 U/L (ref 15–41)
Albumin: 3.9 g/dL (ref 3.5–5.0)
Alkaline Phosphatase: 55 U/L (ref 38–126)
Anion gap: 9 (ref 5–15)
BUN: 15 mg/dL (ref 6–20)
CO2: 25 mmol/L (ref 22–32)
Calcium: 9 mg/dL (ref 8.9–10.3)
Chloride: 108 mmol/L (ref 101–111)
Creatinine, Ser: 1 mg/dL (ref 0.61–1.24)
GFR calc Af Amer: >60 mL/min (ref >60)
GFR calc non Af Amer: >60 mL/min (ref >60)
Glucose, Bld: 118 mg/dL — ABNORMAL HIGH (ref 65–99)
Potassium: 3.9 mmol/L (ref 3.5–5.1)
Sodium: 142 mmol/L (ref 135–145)
Total Bilirubin: 0.5 mg/dL (ref 0.3–1.2)
Total Protein: 6.9 g/dL (ref 6.5–8.1)

## 2015-08-25 LAB — URINALYSIS, ROUTINE W REFLEX MICROSCOPIC
Bilirubin Urine: NEGATIVE
Glucose, UA: NEGATIVE mg/dL
Hgb urine dipstick: NEGATIVE
Ketones, ur: NEGATIVE mg/dL
Leukocytes, UA: NEGATIVE
Nitrite: NEGATIVE
Protein, ur: NEGATIVE mg/dL
Specific Gravity, Urine: 1.029 (ref 1.005–1.030)
pH: 6.5 (ref 5.0–8.0)

## 2015-08-25 LAB — I-STAT TROPONIN, ED: Troponin i, poc: 0 ng/mL (ref 0.00–0.08)

## 2015-08-25 LAB — DIFFERENTIAL
Basophils Absolute: 0 10*3/uL (ref 0.0–0.1)
Basophils Relative: 0 %
Eosinophils Absolute: 0.2 10*3/uL (ref 0.0–0.7)
Eosinophils Relative: 2 %
Lymphocytes Relative: 34 %
Lymphs Abs: 2.4 10*3/uL (ref 0.7–4.0)
Monocytes Absolute: 0.5 10*3/uL (ref 0.1–1.0)
Monocytes Relative: 7 %
Neutro Abs: 4 10*3/uL (ref 1.7–7.7)
Neutrophils Relative %: 57 %

## 2015-08-25 LAB — CBC
HCT: 41.9 % (ref 39.0–52.0)
Hemoglobin: 13.5 g/dL (ref 13.0–17.0)
MCH: 27.9 pg (ref 26.0–34.0)
MCHC: 32.2 g/dL (ref 30.0–36.0)
MCV: 86.6 fL (ref 78.0–100.0)
Platelets: 203 10*3/uL (ref 150–400)
RBC: 4.84 MIL/uL (ref 4.22–5.81)
RDW: 13 % (ref 11.5–15.5)
WBC: 7.1 10*3/uL (ref 4.0–10.5)

## 2015-08-25 MED ORDER — METOCLOPRAMIDE HCL 5 MG/ML IJ SOLN
10.0000 mg | Freq: Once | INTRAMUSCULAR | Status: AC
  Administered 2015-08-25: 10 mg via INTRAVENOUS
  Filled 2015-08-25: qty 2

## 2015-08-25 MED ORDER — OXYCODONE-ACETAMINOPHEN 5-325 MG PO TABS
1.0000 | ORAL_TABLET | Freq: Once | ORAL | Status: AC
  Administered 2015-08-25: 1 via ORAL
  Filled 2015-08-25: qty 1

## 2015-08-25 NOTE — ED Notes (Addendum)
MRI will be here to take patient in about 30 minutes.

## 2015-08-25 NOTE — ED Notes (Signed)
Patient transported to CT 

## 2015-08-25 NOTE — ED Notes (Signed)
MD brought to triage to evaluate patient.

## 2015-08-25 NOTE — ED Provider Notes (Signed)
CSN: [id number]     Arrival date & time 08/25/15  1836 History   First MD Initiated Contact with Patient 08/25/15 1950     Chief Complaint  Patient presents with  . Altered Mental Status  . Numbness    across his head  . Chest Pain     Patient is a 45 y.o. male presenting with altered mental status and chest pain. The history is provided by the patient and the spouse. No language interpreter was used.  Altered Mental Status Chest Pain Associated symptoms: altered mental status    Danny Brewer is a 45 y.o. male who presents to the Emergency Department complaining of HA, numbness.  History is provided by the patient and his wife. They state that since Wednesday of this week he developed a gradual onset throbbing headache with neck pain. The neck pain is worse with turning to the left and to the right. The neck pain is described as a burning sensation. He has a burning in prickling sensation from his left neck down to his left hand across his left chest. Symptoms are constant and worse with moving. His wife states that he's been slightly confused today and states he feels poorly and wants to get evaluated. No reports of fevers, vomiting, sick contacts, travel. 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 He had a hernia repair in December 2016 without any complications. The recent instrumentation or dental procedures.  Past Medical History  Diagnosis Date  . GERD (gastroesophageal reflux disease)   . Kidney stones   . Inguinal hernia 2016    right  . Complication of anesthesia     Pt verble and "prophecizes" after anesthesia   Past Surgical History  Procedure Laterality Date  . Knee surgery    . Hand surgery Left 1987  . Inguinal hernia repair Bilateral 05/27/2015    Procedure: LAPAROSCOPIC BILATERAL  INGUINAL HERNIA REPAIRS WITH MESH;  Surgeon: Karie Soda, MD;  Location: [facility];  Service: General;  Laterality: Bilateral;   Family History  Problem Relation  Age of Onset  . Hypertension Mother   . Cancer Father   . Hypertension Sister   . Sudden death Brother    Social History  Substance Use Topics  . Smoking status: Never Smoker   . Smokeless tobacco: Never Used  . Alcohol Use: No    Review of Systems  Cardiovascular: Positive for chest pain.  All other systems reviewed and are negative.     Allergies  Antihistamines, diphenhydramine-type; Demerol; and Morphine and related  Home Medications   Prior to Admission medications   Medication Sig Start Date End Date Taking? Authorizing Provider  ibuprofen (ADVIL,MOTRIN) 200 MG tablet Take 400 mg by mouth every 6 (six) hours as needed for headache, mild pain or moderate pain.    Yes Historical Provider, MD  naproxen (NAPROSYN) 500 MG tablet Take 1 tablet (500 mg total) by mouth 2 (two) times daily with a meal. Patient not taking: Reported on 08/25/2015 05/27/15   Karie Soda, MD  oxyCODONE (OXY IR/ROXICODONE) 5 MG immediate release tablet Take 1-2 tablets (5-10 mg total) by mouth every 4 (four) hours as needed for moderate pain, severe pain or breakthrough pain. Patient not taking: Reported on 08/25/2015 05/27/15   Karie Soda, MD   BP 109/77 mmHg  Pulse 67  Temp(Src) 98.3 F (36.8 C) (Oral)  Resp 14  Ht  (1.753 m)  Wt 183 lb (83.008 kg)  BMI 27.01  kg/m2  SpO2 100% Physical Exam  Constitutional: He is oriented to person, place, and time. He appears well-developed and well-nourished.  HENT:  Head: Normocephalic and atraumatic.  Eyes: EOM are normal. Pupils are equal, round, and reactive to light.  Neck:  Pain on lateral rotation of the neck to the left or the right.  Cardiovascular: Normal rate and regular rhythm.   No murmur heard. Pulmonary/Chest: Effort normal and breath sounds normal. No respiratory distress.  Abdominal: Soft. There is no tenderness. There is no rebound and no guarding.  Musculoskeletal: He exhibits no edema or tenderness.  2+ radial pulses in BUE   Neurological: He is alert and oriented to person, place, and time. No cranial nerve deficit.  5 out of 5 strength in all 4 extremities. Altered sensation to light touch throughout the left upper extremity and left anterior chest.  Skin: Skin is warm and dry.  Psychiatric: He has a normal mood and affect. His behavior is normal.  Nursing note and vitals reviewed.   ED Course  Procedures (including critical care time) Labs Review Labs Reviewed  COMPREHENSIVE METABOLIC PANEL - Abnormal; Notable for the following:    Glucose, Bld 118 (*)    All other components within normal limits  DIFFERENTIAL  URINE RAPID DRUG SCREEN, HOSP PERFORMED  URINALYSIS, ROUTINE W REFLEX MICROSCOPIC (NOT AT [facility])  CBC  I-STAT TROPOININ, ED    Imaging Review Ct Head Wo Contrast  08/25/2015  CLINICAL DATA:  Confusion and paraesthesias, onset 3 days ago. EXAM: CT HEAD WITHOUT CONTRAST TECHNIQUE: Contiguous axial images were obtained from the base of the skull through the vertex without intravenous contrast. COMPARISON:  03/23/2012 FINDINGS: There is no intracranial hemorrhage, mass or evidence of acute infarction. There is no extra-axial fluid collection. Gray matter and white matter are unremarkable, with normal differentiation. Cavum septum pellucidum incidentally noted. No bony abnormalities evident. Visible paranasal sinuses are clear. IMPRESSION: Normal brain. Electronically Signed   By: Ellery Plunk M.D.   On: 08/25/2015 23:25   Mr Cervical Spine Wo Contrast  08/25/2015  CLINICAL DATA:  Headache, confusion, and left-sided numbness. EXAM: MRI CERVICAL SPINE WITHOUT CONTRAST TECHNIQUE: Multiplanar, multisequence MR imaging of the cervical spine was performed. No intravenous contrast was administered. COMPARISON:  Cervical spine CT 06/27/2008 FINDINGS: No marrow signal abnormality suggestive of fracture, infection, or neoplasm. Normal cord signal. The cord at the upper thoracic level appears diminutive in the  AP dimension (5 mm) but transverse diameter is unremarkable at 13 mm. This finding is likely incidental and would not explain symptom onset 3 days ago. Patulous canal size likely contributes to this appearance. C2-3 and C3-4: Negative. C4-5: Disc: Central disc protrusion with cord contact and mild remodeling, but no compression. Facets: Negative. Canal: Patent. Foramina: No impingement. C5-6: Disc: Small central disc protrusion with cord contact but no compression Facets: Negative. Canal: Patent. Foramina: No impingement. C6-7: Disc: Tiny right paracentral disc protrusion with cord contact but no compression Facets: Negative. Canal: Patent. Foramina: No impingement. C7-T1: Negative IMPRESSION: 1. No acute finding or cord lesion. 2. The patient had a nonspecific brainstem signal abnormality in 2011. Consider updated brain MRI. 3. 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 Electronically Signed   By: Marnee Spring M.D.   On: 08/25/2015 21:49   I have personally reviewed and evaluated these images and lab results as part of my medical decision-making.   EKG Interpretation   Date/Time:  Sunday August 25 2015 19:38:07 EDT Ventricular Rate:  75 PR Interval:  138 QRS Duration: 100 QT Interval:  367 QTC Calculation: 410 R Axis:   75 Text Interpretation:  Sinus rhythm RSR' in V1 or V2, right VCD or RVH ST  elev, probable normal early repol pattern Confirmed by Lincoln Brighamees, Liz [phone]([id number])  on 08/25/2015 7:40:43 PM      MDM   Final diagnoses:  Paresthesia and pain of left extremity    Patient here with headache, neck pain, paresthesias the left upper extremity and left anterior chest. He does have pain with range of motion of his neck but he is able to range the neck fully. He has a non-focal neurologic examination other than subjective paresthesias. His wife reports some intermittent confusion that is waxing and waning. He does not appear confused in the emergency department. MRI  C-spine with no clear source of his paresthesias. CT scan of his brain is negative.  Discussed with Dr. Cena BentonVega with Neurology, recommends medicine admission with MRI/MRA brain and neuro consult.  Hospitalist consulted for admission.      Tilden FossaElizabeth Tarrie Mcmichen, MD 08/26/15 [id number]

## 2015-08-25 NOTE — ED Notes (Addendum)
Patient c/o numbness and tingling to his head, numbness to arms and legs, and confusion per wife at bedside. Onset 3 days ago. Patient is alert and oriented x4. Patient denies difference in sensation to left or right, grips S&E, dorsi/plantar flexion strong. Nausea on Thursday, has since resolved. Patient and his wife states symptoms have worsened progressively. Patient was ambulatory into triage but was unable to recognize where he was walking to, wife had to direct him into the room. Patient states he has left chest pain, and reports significant pain to his neck. Patient is able to move his neck, left and right, and place his chin to chest but complains of severe pain. Patient has a significant family cardiac history, all onset in their 30s.

## 2015-08-26 ENCOUNTER — Observation Stay (HOSPITAL_COMMUNITY): Payer: BC Managed Care – PPO

## 2015-08-26 ENCOUNTER — Encounter (HOSPITAL_COMMUNITY): Payer: Self-pay | Admitting: Family Medicine

## 2015-08-26 DIAGNOSIS — R202 Paresthesia of skin: Secondary | ICD-10-CM | POA: Diagnosis not present

## 2015-08-26 DIAGNOSIS — R0789 Other chest pain: Secondary | ICD-10-CM

## 2015-08-26 DIAGNOSIS — R292 Abnormal reflex: Secondary | ICD-10-CM

## 2015-08-26 DIAGNOSIS — R410 Disorientation, unspecified: Secondary | ICD-10-CM

## 2015-08-26 DIAGNOSIS — R208 Other disturbances of skin sensation: Secondary | ICD-10-CM

## 2015-08-26 DIAGNOSIS — R209 Unspecified disturbances of skin sensation: Secondary | ICD-10-CM

## 2015-08-26 DIAGNOSIS — M5440 Lumbago with sciatica, unspecified side: Secondary | ICD-10-CM | POA: Diagnosis not present

## 2015-08-26 LAB — TROPONIN I: Troponin I: <0.03 ng/mL (ref <0.031)

## 2015-08-26 MED ORDER — NAPROXEN 375 MG PO TABS
375.0000 mg | ORAL_TABLET | Freq: Once | ORAL | Status: DC
  Filled 2015-08-26: qty 1

## 2015-08-26 MED ORDER — SENNOSIDES-DOCUSATE SODIUM 8.6-50 MG PO TABS
1.0000 | ORAL_TABLET | Freq: Every evening | ORAL | Status: DC | PRN
  Administered 2015-08-27: 1 via ORAL
  Filled 2015-08-26 (×2): qty 1

## 2015-08-26 MED ORDER — LORAZEPAM 0.5 MG PO TABS: 0.5000 mg | ORAL_TABLET | Freq: Once | ORAL | Status: DC | PRN

## 2015-08-26 MED ORDER — ONDANSETRON HCL 4 MG/2ML IJ SOLN: 4.0000 mg | Freq: Four times a day (QID) | INTRAMUSCULAR | Status: DC | PRN

## 2015-08-26 MED ORDER — NAPROXEN 500 MG PO TABS: 250.0000 mg | ORAL_TABLET | Freq: Two times a day (BID) | ORAL | Status: DC | PRN

## 2015-08-26 MED ORDER — OXYCODONE HCL 5 MG PO TABS
5.0000 mg | ORAL_TABLET | ORAL | Status: DC | PRN
  Administered 2015-08-26 – 2015-08-28 (×7): 5 mg via ORAL
  Filled 2015-08-26 (×8): qty 1

## 2015-08-26 MED ORDER — ACETAMINOPHEN 650 MG RE SUPP: 650.0000 mg | Freq: Four times a day (QID) | RECTAL | Status: DC | PRN

## 2015-08-26 MED ORDER — ACETAMINOPHEN 325 MG PO TABS: 650.0000 mg | ORAL_TABLET | Freq: Four times a day (QID) | ORAL | Status: DC | PRN

## 2015-08-26 MED ORDER — ONDANSETRON HCL 4 MG PO TABS: 4.0000 mg | ORAL_TABLET | Freq: Four times a day (QID) | ORAL | Status: DC | PRN

## 2015-08-26 NOTE — Consult Note (Signed)
NEURO HOSPITALIST CONSULT NOTE   Requesting physician: Dr. Maryfrances Bunnell   Reason for Consult: Headache.    History obtained from:  Patient     HPI:                                                                                                                                          Danny Brewer is an 45 y.o. male who presented to [facility] for several days of focal paresthesias. Also with an episode of confusion on Saturday and one paroxysm of vertigo for 3 seconds upon standing. He was in his USOH until 3 days ago when he began to develop a tingling and numb sensation in the region of his left pectoralis muscle, as well as numbness with tingling in his arms and neck. He also experiences a burning sensation in his neck with forward and lateral flexion. Symptoms have been intermittent. Wife states that he seemed a little confused on Saturday.   He came in to the ED for evaluation with normal troponin and ECG, as well as normal CT head. MRI of cervical spine revealed small disc herniations without spinal cord compromise. MRI/A of head was then recommended.   Also has had a non-throbbing headache along the vertex of his head that is now rated at 5/10.    Past Medical History  Diagnosis Date  . GERD (gastroesophageal reflux disease)   . Kidney stones   . Inguinal hernia 2016    right  . Complication of anesthesia     Pt verble and "prophecizes" after anesthesia    Past Surgical History  Procedure Laterality Date  . Knee surgery    . Hand surgery Left 1987  . Inguinal hernia repair Bilateral 05/27/2015    Procedure: LAPAROSCOPIC BILATERAL  INGUINAL HERNIA REPAIRS WITH MESH;  Surgeon: Karie Soda, MD;  Location: [facility];  Service: General;  Laterality: Bilateral;    Family History  Problem Relation Age of Onset  . Hypertension Mother   . Cancer Father   . Hypertension Sister   . Sudden death Brother     Social History:  reports that he has never smoked. He has  never used smokeless tobacco. He reports that he does not drink alcohol or use illicit drugs.  Allergies  Allergen Reactions  . Antihistamines, Diphenhydramine-Type Hives and Other (See Comments)    Seizures  . Demerol Hives  . Morphine And Related Hives    Esp with high dose morphine, possibly Dilaudid Tolerates hydrocodone, oxycodone    MEDICATIONS:  Current facility-administered medications:  .  acetaminophen (TYLENOL) tablet 650 mg, 650 mg, Oral, Q6H PRN **OR** acetaminophen (TYLENOL) suppository 650 mg, 650 mg, Rectal, Q6H PRN, Alberteen Sam, MD .  LORazepam (ATIVAN) tablet 0.5 mg, 0.5 mg, Oral, Once PRN, Alberteen Sam, MD .  ondansetron (ZOFRAN) tablet 4 mg, 4 mg, Oral, Q6H PRN **OR** ondansetron (ZOFRAN) injection 4 mg, 4 mg, Intravenous, Q6H PRN, Alberteen Sam, MD .  oxyCODONE (Oxy IR/ROXICODONE) immediate release tablet 5 mg, 5 mg, Oral, Q4H PRN, Alberteen Sam, MD, 5 mg at 08/26/15 1221 .  senna-docusate (Senokot-S) tablet 1 tablet, 1 tablet, Oral, QHS PRN, Alberteen Sam, MD    ROS:                                                                                                                                       History obtained from patient. Positive for nausea. No fever, chills or vomiting. No lateralized weakness.    Blood pressure 124/75, pulse 69, temperature 97.8 F (36.6 C), temperature source Oral, resp. rate 18, height  (1.753 m), weight 83.008 kg (183 lb), SpO2 98 %.   Neurologic Examination:                                                                                                      HEENT-  Normocephalic, no lesions, without obvious abnormality.  Normal external eye and conjunctiva. Tender to palpation posterior paraspinal neck musculature.  Extremities- Warm and well-perfused  Neurological  Examination Mental Status: Alert, oriented, thought content appropriate.  Speech fluent without evidence of aphasia.  Able to follow all commands without difficulty. Cranial Nerves: II: Visual fields intact, PERRL III,IV, VI: ptosis not present, EOMI V,VII: smile symmetric, facial light touch sensation normal bilaterally VIII: hearing intact to conversation IX,X: No hypophonia or hoarseness XI: shoulder shrug symmetric XII: midline tongue extension Motor: Right : Upper extremity   5/5    Left:     Upper extremity   5/5  Lower extremity   5/5     Lower extremity   5/5 Normal tone throughout; no atrophy noted Sensory: Light touch and temperature intact x 4, without extinction Deep Tendon Reflexes: No Hoffman sign on right or left.  2+ and symmetric upper extremities.  3+ left patella, 4+ right patella (crossed adductor), 2+ achilles bilaterally.  Plantars: Right: downgoing   Left: upgoing Cerebellar: Normal finger-to-nose and normal heel-to-shin test bilaterally Gait: Deferred.    Lab Results: Basic  Metabolic Panel:  Recent Labs Lab 08/25/15 2006  NA 142  K 3.9  CL 108  CO2 25  GLUCOSE 118*  BUN 15  CREATININE 1.00  CALCIUM 9.0    Liver Function Tests:  Recent Labs Lab 08/25/15 2006  AST 22  ALT 24  ALKPHOS 55  BILITOT 0.5  PROT 6.9  ALBUMIN 3.9   No results for input(s): LIPASE, AMYLASE in the last 168 hours. No results for input(s): AMMONIA in the last 168 hours.  CBC:  Recent Labs Lab 08/25/15 2006  WBC 7.1  NEUTROABS 4.0  HGB 13.5  HCT 41.9  MCV 86.6  PLT 203    Cardiac Enzymes:  Recent Labs Lab 08/26/15 0439  TROPONINI <0.03    Lipid Panel: No results for input(s): CHOL, TRIG, HDL, CHOLHDL, VLDL, LDLCALC in the last 168 hours.  CBG: No results for input(s): GLUCAP in the last 168 hours.  Microbiology: Results for orders placed or performed in visit on 03/11/15  Rapid strep screen (not at [facility])     Status: None   Collection Time:  03/11/15 11:21 AM  Result Value Ref Range Status   Source THROAT  Final   Streptococcus, Group A Screen (Direct) NEG NEGATIVE Final    Comment:   A Rapid Antigen test may result negative if the antigen level in the sample is below the detection level of this test. The FDA has not cleared this test as a stand-alone test therefore the rapid antigen negative result has reflexed to a Group A Strep culture, unit code 69629.    Effective May 13, 2015, test code 52841 Group A Strep Scrn w/ rflx to Cult will be discontinued and automatically replaced with test code 32440 Strep Group A Ag, w/ reflex to Cult. Eswab specimen type will no longer be acceptable. Dual swab in Red Cap transport tube with liquid Amies transport medium will be the only acceptable specimen type.   Throat culture Loney Loh)     Status: None   Collection Time: 03/11/15 11:21 AM  Result Value Ref Range Status   Organism ID, Bacteria Normal Upper Respiratory Flora  Final   Organism ID, Bacteria No Beta Hemolytic Streptococci Isolated  Final    Coagulation Studies: No results for input(s): LABPROT, INR in the last 72 hours.  Imaging: Ct Head Wo Contrast  08/25/2015  CLINICAL DATA:  Confusion and paraesthesias, onset 3 days ago. EXAM: CT HEAD WITHOUT CONTRAST TECHNIQUE: Contiguous axial images were obtained from the base of the skull through the vertex without intravenous contrast. COMPARISON:  03/23/2012 FINDINGS: There is no intracranial hemorrhage, mass or evidence of acute infarction. There is no extra-axial fluid collection. Gray matter and white matter are unremarkable, with normal differentiation. Cavum septum pellucidum incidentally noted. No bony abnormalities evident. Visible paranasal sinuses are clear. IMPRESSION: Normal brain. Electronically Signed   By: Ellery Plunk M.D.   On: 08/25/2015 23:25   Mr Maxine Glenn Head Wo Contrast  08/26/2015  CLINICAL DATA:  Headache. Confusion. Left-sided numbness. Acute  presentation. EXAM: MRI HEAD WITHOUT CONTRAST MRA HEAD WITHOUT CONTRAST TECHNIQUE: Multiplanar, multiecho pulse sequences of the brain and surrounding structures were obtained without intravenous contrast. Angiographic images of the head were obtained using MRA technique without contrast. COMPARISON:  Head CT and cervical spine MRI yesterday. MRI brain 02/23/2010. FINDINGS: MRI HEAD FINDINGS Diffusion imaging does not show any acute or subacute infarction. Again demonstrated and unchanged is a slightly indistinct focus of abnormal T2 and FLAIR signal in the dorsal pons  to the right of midline at the level of the superior cerebellar peduncle measuring 3.6 mm in diameter. 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 Brainstem is otherwise normal. Cerebral hemispheres are normal without evidence of old or acute infarction, mass lesion, hemorrhage, hydrocephalus or extra-axial collection. No pituitary mass. No significant sinus disease. No skull or skullbase lesion. Major vessels at base of the brain show flow. Incidental cavum septum pellucidum, not significant. MRA HEAD FINDINGS Both internal carotid arteries widely patent into the brain. The anterior and middle cerebral vessels are normal without proximal stenosis, aneurysm or vascular malformation. Both vertebral arteries are widely patent to the basilar. No basilar stenosis. Posterior circulation branch vessels are normal. IMPRESSION: No acute or likely significant finding. Brain is normal except for a 3-4 mm focus of slightly indistinct T2 signal in the right dorsal pons, unchanged since 2011. This is probably incidental and insignificant and most likely is developmental. Electronically Signed   By: Paulina FusiMark  Shogry M.D.   On: 08/26/2015 07:16   Mr Brain Wo Contrast  08/26/2015  CLINICAL DATA:  Headache. Confusion. Left-sided numbness. Acute presentation. EXAM: MRI HEAD WITHOUT CONTRAST MRA HEAD WITHOUT CONTRAST TECHNIQUE:  Multiplanar, multiecho pulse sequences of the brain and surrounding structures were obtained without intravenous contrast. Angiographic images of the head were obtained using MRA technique without contrast. COMPARISON:  Head CT and cervical spine MRI yesterday. MRI brain 02/23/2010. FINDINGS: MRI HEAD FINDINGS Diffusion imaging does not show any acute or subacute infarction. Again demonstrated and unchanged is a slightly indistinct focus of abnormal T2 and FLAIR signal in the dorsal pons to the right of midline at the level of the superior cerebellar peduncle measuring 3.6 mm in diameter. 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 Brainstem is otherwise normal. Cerebral hemispheres are normal without evidence of old or acute infarction, mass lesion, hemorrhage, hydrocephalus or extra-axial collection. No pituitary mass. No significant sinus disease. No skull or skullbase lesion. Major vessels at base of the brain show flow. Incidental cavum septum pellucidum, not significant. MRA HEAD FINDINGS Both internal carotid arteries widely patent into the brain. The anterior and middle cerebral vessels are normal without proximal stenosis, aneurysm or vascular malformation. Both vertebral arteries are widely patent to the basilar. No basilar stenosis. Posterior circulation branch vessels are normal. IMPRESSION: No acute or likely significant finding. Brain is normal except for a 3-4 mm focus of slightly indistinct T2 signal in the right dorsal pons, unchanged since 2011. This is probably incidental and insignificant and most likely is developmental. Electronically Signed   By: Paulina FusiMark  Shogry M.D.   On: 08/26/2015 07:16   Mr Cervical Spine Wo Contrast  08/25/2015  CLINICAL DATA:  Headache, confusion, and left-sided numbness. EXAM: MRI CERVICAL SPINE WITHOUT CONTRAST TECHNIQUE: Multiplanar, multisequence MR imaging of the cervical spine was performed. No intravenous contrast was  administered. COMPARISON:  Cervical spine CT 06/27/2008 FINDINGS: No marrow signal abnormality suggestive of fracture, infection, or neoplasm. Normal cord signal. The cord at the upper thoracic level appears diminutive in the AP dimension (5 mm) but transverse diameter is unremarkable at 13 mm. This finding is likely incidental and would not explain symptom onset 3 days ago. Patulous canal size likely contributes to this appearance. C2-3 and C3-4: Negative. C4-5: Disc: Central disc protrusion with cord contact and mild remodeling, but no compression. Facets: Negative. Canal: Patent. Foramina: No impingement. C5-6: Disc: Small central disc protrusion with cord contact but no compression  Facets: Negative. Canal: Patent. Foramina: No impingement. C6-7: Disc: Tiny right paracentral disc protrusion with cord contact but no compression Facets: Negative. Canal: Patent. Foramina: No impingement. C7-T1: Negative IMPRESSION: 1. No acute finding or cord lesion. 2. The patient had a nonspecific brainstem signal abnormality in 2011. Consider updated brain MRI. 3. 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 Electronically Signed   By: Marnee Spring M.D.   On: 08/25/2015 21:49    Assessment: 1. Headache. Paraspinal muscles along posterior neck are tender to palpation, suggestive of muscle tension as contributing factor regarding the patient's headache.  2. Small disc herniations seen on MRI at C4-5 to C6-7 without cord compression.  3. MRI brain is normal except for a 3-4 mm focus of slightly indistinct T2 signal in the right dorsal pons, unchanged since 2011. This is probably incidental and insignificant and most likely is developmental.  4. 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 There is no associated weakness or spasticity. The patient states that his lower extremities are asymptomatic except for occasional sciatic  pain radiating down left leg.   Plan: 1. MRI of thoracic spine with and without contrast, to include the conus medullaris.  2. Serum B12, MMA and homocysteine levels.  3. Trial of IV Robaxin, 1000 mg TID x 3 doses, then stop.   Caryl Pina, MD 08/26/2015, 4:44 PM

## 2015-08-26 NOTE — ED Notes (Addendum)
Attempted to call report but RN was busy.  She will call back.  Arline Asp(Cindy is the Charity fundraiserN)  Report given to [place], [employer]   [facility] has been called

## 2015-08-26 NOTE — ED Notes (Signed)
[facility] called for transport. 

## 2015-08-26 NOTE — ED Notes (Signed)
Patient transported to MRI 

## 2015-08-26 NOTE — ED Notes (Signed)
[facility] here for pickup.  [facility].  Pain 8/10.  Printed EKG, notified MD.

## 2015-08-26 NOTE — Progress Notes (Signed)
NURSING PROGRESS NOTE  Danny Brewer [id number] Admission Data: 08/26/2015 9:06 AM Attending Provider: Alberteen Samhristopher P Danford, MD WUJ:WJXBJYN,WGNFAOPCP:PICKARD,WARREN TOM, MD Code Status: FULL  Danny Brewer is a 45 y.o. male patient admitted from ED:  -No acute distress noted.  -No complaints of shortness of breath.  -No complaints of chest pain.   Cardiac Monitoring: Box # 9 in place. Cardiac monitor yields:normal sinus rhythm.  Blood pressure 132/88, pulse 71, temperature 98.3 F (36.8 C), temperature source Oral, resp. rate 18, height 5\' 9"  (1.753 m), weight 83.008 kg (183 lb), SpO2 100 %.   IV Fluids:  IV in place, occlusive dsg intact without redness, IV cath antecubital right, condition patent and no redness none.   Allergies:  Antihistamines, diphenhydramine-type; Demerol; and Morphine and related  Past Medical History:   has a past medical history of GERD (gastroesophageal reflux disease); Kidney stones; Inguinal hernia (2016); and Complication of anesthesia.  Past Surgical History:   has past surgical history that includes Knee surgery; Hand surgery (Left, 1987); and Inguinal hernia repair (Bilateral, 05/27/2015).  Social History:   reports that he has never smoked. He has never used smokeless tobacco. He reports that he does not drink alcohol or use illicit drugs.  Skin: Intact  Patient/Family orientated to room. Information packet given to patient/family. Admission inpatient armband information verified with patient/family to include name and date of birth and placed on patient arm. Side rails up x 2, fall assessment and education completed with patient/family. Patient/family able to verbalize understanding of risk associated with falls and verbalized understanding to call for assistance before getting out of bed. Call light within reach. Patient/family able to voice and demonstrate understanding of unit orientation instructions.    Will continue to evaluate and treat per MD  orders.  Bennie Pieriniyndi Sagan Maselli, RN

## 2015-08-26 NOTE — Progress Notes (Signed)
[facility]. Pt will be transferred to [id number] via [facility].

## 2015-08-26 NOTE — Progress Notes (Signed)
[facility], Judeth CornfieldStephanie.

## 2015-08-26 NOTE — H&P (Signed)
History and Physical  Patient Name: Danny Brewer     AVW:[id number]    DOB: 1971-04-04    DOA: 08/25/2015 Referring physician: Tilden FossaElizabeth Rees, MD PCP: Leo GrosserPICKARD,WARREN TOM, MD      Chief Complaint: Arm tingling, confusion  HPI: Danny Brewer is a 45 y.o. male with no significant past medical history who presents with several days worsening tingling and confusion.  The patient was in his usual state of health until 3 days ago when he started to develop numbness and tingling in his arms and neck, burning in his neck, and headache.   these symptoms seem to be intermittent, but every time they return there worse than before, and today he felt like additionally he was "dazed" and confused at times, plus there was a patch of numbness in his central chest, so he came to the ER.  At no point was there LOC, seizure, focal weakness, speech difficulties. He has had no recent travel, no fever, no recent URI symptoms, no previous neck pain, or migraine. He has no cold sores or history of HSV.   In the ED, he was hemodynamically stable.  Electrolytes, renal function, and complete blood count were normal. A troponin and ECG were normal. A urinalysis was clear. A CT of the head was unremarkable, and an MRI of the C-spine showed small disc herniations but no spinal cord lesions. The case was discussed with neurology who recommended MRI/MRA of the head and neurology consult, and so [facility] were asked to admit.     Review of Systems:  Pt complains of burning in the neck, worse with turning, headache frontal, throbbing in character and severe, numbness of the chest, his hand swelling, numbness and tingling in the arms, confusion. All other systems negative except as just noted or noted in the history of present illness.  Allergies  Allergen Reactions  . Antihistamines, Diphenhydramine-Type Hives and Other (See Comments)    Seizures  . Demerol Hives  . Morphine And Related Hives    Esp with high dose morphine,  possibly Dilaudid Tolerates hydrocodone, oxycodone    Prior to Admission medications   Medication Sig Start Date End Date Taking? Authorizing Provider  ibuprofen (ADVIL,MOTRIN) 200 MG tablet Take 400 mg by mouth every 6 (six) hours as needed for headache, mild pain or moderate pain.    Yes Historical Provider, MD  naproxen (NAPROSYN) 500 MG tablet Take 1 tablet (500 mg total) by mouth 2 (two) times daily with a meal. Patient not taking: Reported on 08/25/2015 05/27/15   Karie SodaSteven Gross, MD  oxyCODONE (OXY IR/ROXICODONE) 5 MG immediate release tablet Take 1-2 tablets (5-10 mg total) by mouth every 4 (four) hours as needed for moderate pain, severe pain or breakthrough pain. Patient not taking: Reported on 08/25/2015 05/27/15   Karie SodaSteven Gross, MD    Past Medical History  Diagnosis Date  . GERD (gastroesophageal reflux disease)   . Kidney stones   . Inguinal hernia 2016    right  . Complication of anesthesia     Pt verble and "prophecizes" after anesthesia    Past Surgical History  Procedure Laterality Date  . Knee surgery    . Hand surgery Left 1987  . Inguinal hernia repair Bilateral 05/27/2015    Procedure: LAPAROSCOPIC BILATERAL  INGUINAL HERNIA REPAIRS WITH MESH;  Surgeon: Karie SodaSteven Gross, MD;  Location: [facility];  Service: General;  Laterality: Bilateral;    Family history: family history includes Cancer in his father; Hypertension in his mother and sister; Sudden  death in his brother. no known nervous system disease in the family.  Social History: Patient lives with his wife and 2 children ages 12 and 22. He is a Copy at [employer]  he does not smoke or use alcohol.   no recent travel, no animal exposures, no sick contacts.      Physical Exam: BP 109/77 mmHg  Pulse 67  Temp(Src) 98.3 F (36.8 C) (Oral)  Resp 14  Ht  (1.753 m)  Wt 83.008 kg (183 lb)  BMI 27.01 kg/m2  SpO2 100% General appearance: Well-developed, adult male, alert and in no acute  distress.   Eyes:  Anicteric, conjunctiva pink, lids and lashes normal.     ENT: No nasal deformity, discharge, or epistaxis.  OP moist without lesions.   Lymph: No cervical or supraclavicular lymphadenopathy. Skin: Warm and dry.  No suspicious rashes or lesions. Cardiac: RRR, nl S1-S2, no murmurs appreciated.  Capillary refill is brisk.  JVP normal.  No LE edema.  Radial and DP pulses 2+ and symmetric. Respiratory: Normal respiratory rate and rhythm.  CTAB without rales or wheezes. Abdomen: Abdomen soft without rigidity.  No TTP. No ascites, distension.   MSK: No deformities or effusions.  There is no vertebral tenderness, except in the cervical spine, where he is tender over vertebrae and paraspinal muscles, has burning pain reproduced with neck turning or pressure downward on the crown of the head.  Neck supple. Neuro: Cranial nerves normal.  Sensorium intact and responding to questions, attention normal.  Speech is fluent.  Moves all extremities equally and with normal coordination.    Psych: Behavior appropriate.  Affect normal.  No evidence of aural or visual hallucinations or delusions.       Labs on Admission:  The metabolic panel shows normal electrolytes and renal function. The complete blood count shows no anemia, thrombocytopenia or leukocytosis. Troponin normal. Urinalysis normal.   Radiological Exams on Admission: Personally reviewed: Ct Head Wo Contrast  08/25/2015  CLINICAL DATA:  Confusion and paraesthesias, onset 3 days ago. EXAM: CT HEAD WITHOUT CONTRAST TECHNIQUE: Contiguous axial images were obtained from the base of the skull through the vertex without intravenous contrast. COMPARISON:  03/23/2012 FINDINGS: There is no intracranial hemorrhage, mass or evidence of acute infarction. There is no extra-axial fluid collection. Gray matter and white matter are unremarkable, with normal differentiation. Cavum septum pellucidum incidentally noted. No bony abnormalities evident. Visible paranasal  sinuses are clear. IMPRESSION: Normal brain. Electronically Signed   By: Ellery Plunk M.D.   On: 08/25/2015 23:25   Mr Cervical Spine Wo Contrast  08/25/2015  CLINICAL DATA:  Headache, confusion, and left-sided numbness. EXAM: MRI CERVICAL SPINE WITHOUT CONTRAST TECHNIQUE: Multiplanar, multisequence MR imaging of the cervical spine was performed. No intravenous contrast was administered. COMPARISON:  Cervical spine CT 06/27/2008 FINDINGS: No marrow signal abnormality suggestive of fracture, infection, or neoplasm. Normal cord signal. The cord at the upper thoracic level appears diminutive in the AP dimension (5 mm) but transverse diameter is unremarkable at 13 mm. This finding is likely incidental and would not explain symptom onset 3 days ago. Patulous canal size likely contributes to this appearance. C2-3 and C3-4: Negative. C4-5: Disc: Central disc protrusion with cord contact and mild remodeling, but no compression. Facets: Negative. Canal: Patent. Foramina: No impingement. C5-6: Disc: Small central disc protrusion with cord contact but no compression Facets: Negative. Canal: Patent. Foramina: No impingement. C6-7: Disc: Tiny right paracentral disc protrusion with cord contact but no compression  Facets: Negative. Canal: Patent. Foramina: No impingement. C7-T1: Negative IMPRESSION: 1. No acute finding or cord lesion. 2. The patient had a nonspecific brainstem signal abnormality in 2011. Consider updated brain MRI. 3. 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 Electronically Signed   By: Marnee Spring M.D.   On: 08/25/2015 21:49    EKG: Independently reviewed. Rate 75, QTc 410, no QT or Twave changes, normal early repolarization pattern.    Assessment/Plan 1. Confusion, neck pain, headache:  These symptoms are mild.  He certainly does not have a bacterial meningitis.  HSV is doubted, as is viral meningitis actually.  Likely has migraine triggered my neck muscle spasm.     -Recommend non-urgent Neuro consult -Will transfer to [facility] for MRI/MRA head and Neuro consult, per Neurology -Naproxen for neck pain, headache -Acetaminophen or oxycodone for unresolved pain     DVT PPx: SCDs Diet: Regular Consultants: Neuro Code Status: FULL Family Communication: Wife  Medical decision making: What exists of the patient's previous chart was reviewed in depth and the case was discussed with Dr. Madilyn Hook. Patient seen 12:05 AM on 08/26/2015.  Disposition Plan:  I recommend admission to medical surgical bed, observation status.  Clinical condition: stable.  Anticipate Neuro consult, MRI/MRA head, and disposition per Neurology.      Alberteen Sam [facility] Pager [phone]

## 2015-08-26 NOTE — ED Notes (Signed)
Report give to 5W RN @ [facility].

## 2015-08-26 NOTE — Progress Notes (Signed)
PROGRESS NOTE  Danny BoerRalph Brewer OZH:[id number] DOB: 1971-02-14 DOA: 08/25/2015 PCP: Leo GrosserPICKARD,WARREN TOM, MD Interim History 45 year old male who enjoys good health presented with 4 day history of dysesthesias manifested by bilateral arm numbness, tingling, and burning. The patient states that he has similar sensations in his neck that have radiated down to his shoulders, shoulder blade areas, and to his arms. He also has fleeting sensations of dysesthesias of his bilateral hands. He feels that turning his neck left and right also triggers some of the symptoms. He denies any recent injuries, trauma, or heavy lifting. While he was at church on 08/25/2015, the patient felt some dizziness when he stood up without falling or syncope. Because of his persistent symptoms he presented to the emergency department for further evaluation. While he was in the emergency department, the patient had some chest discomfort when he stood up to go to the bathroom lasting less than 1 minute. There was no associated shortness of breath, diaphoresis, palpitations, nausea, or syncope. EKG was sinus rhythm with early repolarization. The patient remained hemodynamically stable throughout his emergency department stay. Neurology was consulted by the ED and [facility]. Assessment/Plan: Sensory disturbance -Manifested by dysesthesias to the neck and bilateral arms -08/25/2015 MRI cervical spine small disc herniation C4-5, C6-7 without cord compression; no other acute findings -08/26/2015 MRI brain--negative -08/26/2015 MRA brain--widely patent internal carotid,vertebral and basilar arteries -Serum B12 -HIV -RPR -TSH -neurology previously consulted Atypical chest pain -POC troponin neg -serum troponin neg x 1 -EKG without concerning ischemic changes x 2 -As per Dr. Maryfrances Bunnellanford, pt can go to a medsurg bed -echo ?Confusion/dizziness -Patient felt "dazed" on 08/25/15 -no  fever,meningismus -presently A&O x 4, fluent speech, without focal neuro deficits -Check orthostatics -tympanic membranes with scarring but No erythema  Family Communication:   No family at beside Disposition Plan:   [facility]      Procedures/Studies: Ct Head Wo Contrast  08/25/2015  CLINICAL DATA:  Confusion and paraesthesias, onset 3 days ago. EXAM: CT HEAD WITHOUT CONTRAST TECHNIQUE: Contiguous axial images were obtained from the base of the skull through the vertex without intravenous contrast. COMPARISON:  03/23/2012 FINDINGS: There is no intracranial hemorrhage, mass or evidence of acute infarction. There is no extra-axial fluid collection. Gray matter and white matter are unremarkable, with normal differentiation. Cavum septum pellucidum incidentally noted. No bony abnormalities evident. Visible paranasal sinuses are clear. IMPRESSION: Normal brain. Electronically Signed   By: Ellery Plunkaniel R Mitchell M.D.   On: 08/25/2015 23:25   Mr Maxine GlennMra Head Wo Contrast  08/26/2015  CLINICAL DATA:  Headache. Confusion. Left-sided numbness. Acute presentation. EXAM: MRI HEAD WITHOUT CONTRAST MRA HEAD WITHOUT CONTRAST TECHNIQUE: Multiplanar, multiecho pulse sequences of the brain and surrounding structures were obtained without intravenous contrast. Angiographic images of the head were obtained using MRA technique without contrast. COMPARISON:  Head CT and cervical spine MRI yesterday. MRI brain 02/23/2010. FINDINGS: MRI HEAD FINDINGS Diffusion imaging does not show any acute or subacute infarction. Again demonstrated and unchanged is a slightly indistinct focus of abnormal T2 and FLAIR signal in the dorsal pons to the right of midline at the level of the superior cerebellar peduncle measuring 3.6 mm in diameter. 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 Brainstem is otherwise normal. Cerebral hemispheres are normal without evidence of old or  acute infarction, mass lesion, hemorrhage, hydrocephalus or extra-axial collection. No  pituitary mass. No significant sinus disease. No skull or skullbase lesion. Major vessels at base of the brain show flow. Incidental cavum septum pellucidum, not significant. MRA HEAD FINDINGS Both internal carotid arteries widely patent into the brain. The anterior and middle cerebral vessels are normal without proximal stenosis, aneurysm or vascular malformation. Both vertebral arteries are widely patent to the basilar. No basilar stenosis. Posterior circulation branch vessels are normal. IMPRESSION: No acute or likely significant finding. Brain is normal except for a 3-4 mm focus of slightly indistinct T2 signal in the right dorsal pons, unchanged since 2011. This is probably incidental and insignificant and most likely is developmental. Electronically Signed   By: Paulina Fusi M.D.   On: 08/26/2015 07:16   Mr Brain Wo Contrast  08/26/2015  CLINICAL DATA:  Headache. Confusion. Left-sided numbness. Acute presentation. EXAM: MRI HEAD WITHOUT CONTRAST MRA HEAD WITHOUT CONTRAST TECHNIQUE: Multiplanar, multiecho pulse sequences of the brain and surrounding structures were obtained without intravenous contrast. Angiographic images of the head were obtained using MRA technique without contrast. COMPARISON:  Head CT and cervical spine MRI yesterday. MRI brain 02/23/2010. FINDINGS: MRI HEAD FINDINGS Diffusion imaging does not show any acute or subacute infarction. Again demonstrated and unchanged is a slightly indistinct focus of abnormal T2 and FLAIR signal in the dorsal pons to the right of midline at the level of the superior cerebellar peduncle measuring 3.6 mm in diameter. 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 Brainstem is otherwise normal. Cerebral hemispheres are normal without evidence of old or acute infarction, mass lesion, hemorrhage, hydrocephalus or extra-axial collection.  No pituitary mass. No significant sinus disease. No skull or skullbase lesion. Major vessels at base of the brain show flow. Incidental cavum septum pellucidum, not significant. MRA HEAD FINDINGS Both internal carotid arteries widely patent into the brain. The anterior and middle cerebral vessels are normal without proximal stenosis, aneurysm or vascular malformation. Both vertebral arteries are widely patent to the basilar. No basilar stenosis. Posterior circulation branch vessels are normal. IMPRESSION: No acute or likely significant finding. Brain is normal except for a 3-4 mm focus of slightly indistinct T2 signal in the right dorsal pons, unchanged since 2011. This is probably incidental and insignificant and most likely is developmental. Electronically Signed   By: Paulina Fusi M.D.   On: 08/26/2015 07:16   Mr Cervical Spine Wo Contrast  08/25/2015  CLINICAL DATA:  Headache, confusion, and left-sided numbness. EXAM: MRI CERVICAL SPINE WITHOUT CONTRAST TECHNIQUE: Multiplanar, multisequence MR imaging of the cervical spine was performed. No intravenous contrast was administered. COMPARISON:  Cervical spine CT 06/27/2008 FINDINGS: No marrow signal abnormality suggestive of fracture, infection, or neoplasm. Normal cord signal. The cord at the upper thoracic level appears diminutive in the AP dimension (5 mm) but transverse diameter is unremarkable at 13 mm. This finding is likely incidental and would not explain symptom onset 3 days ago. Patulous canal size likely contributes to this appearance. C2-3 and C3-4: Negative. C4-5: Disc: Central disc protrusion with cord contact and mild remodeling, but no compression. Facets: Negative. Canal: Patent. Foramina: No impingement. C5-6: Disc: Small central disc protrusion with cord contact but no compression Facets: Negative. Canal: Patent. Foramina: No impingement. C6-7: Disc: Tiny right paracentral disc protrusion with cord contact but no compression Facets: Negative.  Canal: Patent. Foramina: No impingement. C7-T1: Negative IMPRESSION: 1. No acute finding or cord lesion. 2. The patient had a nonspecific brainstem signal abnormality in 2011. Consider updated brain MRI. 3.  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 Electronically Signed   By: Marnee Spring M.D.   On: 08/25/2015 21:49        Subjective: Still having some dysesthesias when he turns his neck. Denies any headache, chest pain, breast breath, nausea, vomiting, diarrhea, abdominal pain. No visual disturbance.  Objective: Filed Vitals:   08/26/15 0500 08/26/15 0530 08/26/15 0600 08/26/15 0743  BP: 119/84 112/87 107/78 126/87  Pulse: 67 68 70 74  Temp:    98.1 F (36.7 C)  TempSrc:    Oral  Resp: Height:      Weight:      SpO2: 100% 100% 99% 100%   No intake or output data in the 24 hours ending 08/26/15 0751 Weight change:  Exam:   General:  Pt is alert, follows commands appropriately, not in acute distress  HEENT: No icterus, No thrush, No neck mass, [place]/AT  Cardiovascular: RRR, S1/S2, no rubs, no gallops  Respiratory: CTA bilaterally, no wheezing, no crackles, no rhonchi  Abdomen: Soft/+BS, non tender, non distended, no guarding  Extremities: No edema, No lymphangitis, No petechiae, No rashes, no synovitis Neuro:  CN II-XII intact, strength 4/5 in RUE, RLE, strength 4/5 LUE, LLE; sensation intact bilateral; no dysmetria; babinski equivocal   Data Reviewed: Basic Metabolic Panel:  Recent Labs Lab 08/25/15 2006  NA 142  K 3.9  CL 108  CO2 25  GLUCOSE 118*  BUN 15  CREATININE 1.00  CALCIUM 9.0   Liver Function Tests:  Recent Labs Lab 08/25/15 2006  AST 22  ALT 24  ALKPHOS 55  BILITOT 0.5  PROT 6.9  ALBUMIN 3.9   No results for input(s): LIPASE, AMYLASE in the last 168 hours. No results for input(s): AMMONIA in the last 168 hours. CBC:  Recent Labs Lab 08/25/15 2006  WBC 7.1  NEUTROABS 4.0  HGB 13.5  HCT 41.9    MCV 86.6  PLT 203   Cardiac Enzymes:  Recent Labs Lab 08/26/15 0439  TROPONINI <0.03   BNP: Invalid input(s): POCBNP CBG: No results for input(s): GLUCAP in the last 168 hours.  No results found for this or any previous visit (from the past 240 hour(s)).   Scheduled Meds:  Continuous Infusions:    Danny Diguglielmo, DO  [facility] Pager [phone]  If 7PM-7AM, please contact night-coverage [email or web address] Password [email or web address] 08/26/2015, 7:51 AM

## 2015-08-26 NOTE — Progress Notes (Addendum)
Hospitalist Interim Progress Note  Called to patient bedside for chest pain.  On arrival of [facility], they asked routinely about pain, to which patient replied he had left sided exertional chest pain since being in the ER.  This was a new pain, "squeezing", 7/10, in the left chest/axilla, that occurred when the patient walked to the bathroom a short time ago, and is now resolving, 15 minutes later.  BP 110/81 mmHg  Pulse 63  Temp(Src) 98.3 F (36.8 C) (Oral)  Resp 20  Ht 5\' 9"  (1.753 m)  Wt 83.008 kg (183 lb)  BMI 27.01 kg/m2  SpO2 100%  Sleepy but oriented and in same mental status as before.  Cor regular, no murmurs.  No rales.     ECG repeated, unchanged (early repol, sinus rhythm, normal rate, no ST depressions or new TWI).  Troponin ordered.  D/w nursing, who are uncomfortable accepting the patient on a medical floor.  HEART score 1 (for atypical symptoms, no risk factors or ECG changes). Will repeat troponin now.  If still negative, angina is extremely unlikely and will send for med surg bed as planned.   Update: Repeat troponin negative.  The patient had a fleeting chest sensation, unrelated to his chief presenting complaint, which I do not believe could be construed as anginal, under the current circumstances.  I recommend evaluation of the patient's headache and paresthesias, as previously planned.

## 2015-08-27 ENCOUNTER — Observation Stay (HOSPITAL_BASED_OUTPATIENT_CLINIC_OR_DEPARTMENT_OTHER): Payer: BC Managed Care – PPO

## 2015-08-27 ENCOUNTER — Observation Stay (HOSPITAL_COMMUNITY): Payer: BC Managed Care – PPO

## 2015-08-27 DIAGNOSIS — R208 Other disturbances of skin sensation: Secondary | ICD-10-CM | POA: Diagnosis not present

## 2015-08-27 DIAGNOSIS — M5440 Lumbago with sciatica, unspecified side: Secondary | ICD-10-CM | POA: Diagnosis not present

## 2015-08-27 DIAGNOSIS — R0789 Other chest pain: Secondary | ICD-10-CM | POA: Diagnosis not present

## 2015-08-27 DIAGNOSIS — M549 Dorsalgia, unspecified: Secondary | ICD-10-CM | POA: Insufficient documentation

## 2015-08-27 DIAGNOSIS — R079 Chest pain, unspecified: Secondary | ICD-10-CM | POA: Diagnosis not present

## 2015-08-27 DIAGNOSIS — R202 Paresthesia of skin: Secondary | ICD-10-CM | POA: Diagnosis not present

## 2015-08-27 LAB — COMPREHENSIVE METABOLIC PANEL
ALT: 20 U/L (ref 17–63)
AST: 19 U/L (ref 15–41)
Albumin: 3.3 g/dL — ABNORMAL LOW (ref 3.5–5.0)
Alkaline Phosphatase: 52 U/L (ref 38–126)
Anion gap: 8 (ref 5–15)
BUN: 12 mg/dL (ref 6–20)
CO2: 25 mmol/L (ref 22–32)
Calcium: 8.5 mg/dL — ABNORMAL LOW (ref 8.9–10.3)
Chloride: 108 mmol/L (ref 101–111)
Creatinine, Ser: 0.95 mg/dL (ref 0.61–1.24)
GFR calc Af Amer: >60 mL/min (ref >60)
GFR calc non Af Amer: >60 mL/min (ref >60)
Glucose, Bld: 107 mg/dL — ABNORMAL HIGH (ref 65–99)
Potassium: 4 mmol/L (ref 3.5–5.1)
Sodium: 141 mmol/L (ref 135–145)
Total Bilirubin: 0.3 mg/dL (ref 0.3–1.2)
Total Protein: 5.9 g/dL — ABNORMAL LOW (ref 6.5–8.1)

## 2015-08-27 LAB — ECHOCARDIOGRAM COMPLETE
Height: 69 in
Weight: 2928 oz

## 2015-08-27 LAB — D-DIMER, QUANTITATIVE (NOT AT ARMC): D-Dimer, Quant: <0.27 ug/mL-FEU (ref 0.00–0.50)

## 2015-08-27 MED ORDER — GADOBENATE DIMEGLUMINE 529 MG/ML IV SOLN
15.0000 mL | Freq: Once | INTRAVENOUS | Status: AC | PRN
  Administered 2015-08-27: 15 mL via INTRAVENOUS

## 2015-08-27 MED ORDER — DEXAMETHASONE SODIUM PHOSPHATE 4 MG/ML IJ SOLN
4.0000 mg | Freq: Two times a day (BID) | INTRAMUSCULAR | Status: DC
  Administered 2015-08-27 – 2015-08-28 (×3): 4 mg via INTRAVENOUS
  Filled 2015-08-27 (×3): qty 1

## 2015-08-27 MED ORDER — DEXTROSE 5 % IV SOLN
1000.0000 mg | Freq: Three times a day (TID) | INTRAVENOUS | Status: AC
  Administered 2015-08-27 (×2): 1000 mg via INTRAVENOUS
  Filled 2015-08-27 (×2): qty 10

## 2015-08-27 NOTE — Evaluation (Signed)
Physical Therapy Evaluation Patient Details Name: Danny BoerRalph Eichelberger MRN: [id number] DOB: [birth year] Today's Date: 08/27/2015   History of Present Illness  Pt is a 45 y/o M with 4 day history of dysesthesias manifested by bilateral arm numbness, tingling, and burning. The patient states that he has similar sensations in his neck that have radiated down to his shoulders, shoulder blade areas, and to his arms. He also has fleeting sensations of dysesthesias of his bilateral hands.  MRI cervical spine revealed small disc herniation C4-5, C6-7 w/o cord compression, MRI thoracic and lumbar spine pending.  Pt's PMH includes Lt hand surgery, knee surgery.    Clinical Impression  Pt admitted with above diagnosis. Pt c/o tightness in Bil hands (recommending OT evaluation).  No instability noted w/ high level balance activities.  WNL Bil LE strength, sensation, coordination.  No skilled PT needs identified.  PT is signing off.     Follow Up Recommendations No PT follow up    Equipment Recommendations  None recommended by PT    Recommendations for Other Services OT consult     Precautions / Restrictions Precautions Precautions: None Restrictions Weight Bearing Restrictions: No      Mobility  Bed Mobility Overal bed mobility: Independent             General bed mobility comments: no cues or physical assist needed  Transfers Overall transfer level: Independent Equipment used: None             General transfer comment: no cues or physical assist needed  Ambulation/Gait Ambulation/Gait assistance: Independent Ambulation Distance (Feet): 450 Feet Assistive device: None Gait Pattern/deviations: WFL(Within Functional Limits)   Gait velocity interpretation: at or above normal speed for age/gender General Gait Details: No cues or physical assist needed.  Tolerated high level balance activities listed below w/o instability.  Stairs Stairs: Yes Stairs assistance: Independent Stair  Management: No rails;Forwards;Alternating pattern Number of Stairs: 10 General stair comments: no assist needed; no instability noted  Wheelchair Mobility    Modified Rankin (Stroke Patients Only)       Balance Overall balance assessment: Independent                           High level balance activites: Side stepping;Backward walking;Direction changes;Sudden stops;Head turns;Other (comment) (stepping over object) High Level Balance Comments: No instability w/ high level balance activities listed above Standardized Balance Assessment Standardized Balance Assessment : Dynamic Gait Index   Dynamic Gait Index Level Surface: Normal Change in Gait Speed: Normal Gait with Horizontal Head Turns: Normal Gait with Vertical Head Turns: Normal Gait and Pivot Turn: Normal Step Over Obstacle: Normal Step Around Obstacles: Normal Steps: Normal Total Score: 24       Pertinent Vitals/Pain Pain Assessment: 0-10 Pain Score: 7  (5/10 upon arrival, 6-7/10 at end of ambulation) Pain Location: headache Pain Descriptors / Indicators: Headache Pain Intervention(s): Limited activity within patient's tolerance;Monitored during session;Other (comment) (notified RN, pt not currently due for pain medicine)    Home Living Family/patient expects to be discharged to:: Private residence Living Arrangements: Spouse/significant other;Children Available Help at Discharge: Family;Available PRN/intermittently Type of Home: House Home Access: Level entry     Home Layout: Two level;Bed/bath upstairs Home Equipment: None      Prior Function Level of Independence: Independent         Comments: 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     Hand  Dominance   Dominant Hand: Right    Extremity/Trunk Assessment   Upper Extremity Assessment: Defer to OT evaluation (c/o tightness in Bil hands )           Lower Extremity Assessment:  Overall WFL for tasks assessed (WNL strength, no coordination or sensation impairments)         Communication   Communication: No difficulties  Cognition Arousal/Alertness: Awake/alert Behavior During Therapy: WFL for tasks assessed/performed Overall Cognitive Status: Within Functional Limits for tasks assessed                      General Comments      Exercises        Assessment/Plan    PT Assessment Patent does not need any further PT services  PT Diagnosis Difficulty walking   PT Problem List    PT Treatment Interventions     PT Goals (Current goals can be found in the Care Plan section) Acute Rehab PT Goals Patient Stated Goal: to figure out what is going on PT Goal Formulation: All assessment and education complete, DC therapy    Frequency     Barriers to discharge        Co-evaluation               End of Session Equipment Utilized During Treatment: Gait belt Activity Tolerance: Patient tolerated treatment well Patient left: in bed;with call bell/phone within reach;Other (comment) (sitting EOB) Nurse Communication: Mobility status;Other (comment) (headache which increased w/ activity)    Functional Assessment Tool Used: Clinical Judgement Functional Limitation: Mobility: Walking and moving around Mobility: Walking and Moving Around Current Status [phone]): 0 percent impaired, limited or restricted Mobility: Walking and Moving Around Goal Status [phone]): 0 percent impaired, limited or restricted Mobility: Walking and Moving Around Discharge Status [phone]): 0 percent impaired, limited or restricted    Time: 9147-8295 PT Time Calculation (min) (ACUTE ONLY): 19 min   Charges:   PT Evaluation $PT Eval Low Complexity: 1 Procedure     PT G Codes:   PT G-Codes **NOT FOR INPATIENT CLASS** Functional Assessment Tool Used: Clinical Judgement Functional Limitation: Mobility: Walking and moving around Mobility: Walking and Moving Around Current  Status (A2130): 0 percent impaired, limited or restricted Mobility: Walking and Moving Around Goal Status (Q6578): 0 percent impaired, limited or restricted Mobility: Walking and Moving Around Discharge Status (I6962): 0 percent impaired, limited or restricted   Encarnacion Chu PT, DPT  Pager: [phone] Phone: [phone] 08/27/2015, 5:28 PM

## 2015-08-27 NOTE — Progress Notes (Signed)
PROGRESS NOTE  Danny Brewer ZOX:[id number] DOB: 1971/03/30 DOA: 08/25/2015 PCP: Leo Grosser, MD Interim History 45 year old male who enjoys good health presented with 4 day history of dysesthesias manifested by bilateral arm numbness, tingling, and burning. The patient states that he has similar sensations in his neck that have radiated down to his shoulders, shoulder blade areas, and to his arms. He also has fleeting sensations of dysesthesias of his bilateral hands. He feels that turning his neck left and right also triggers some of the symptoms. He denies any recent injuries, trauma, or heavy lifting. While he was at church on 08/25/2015, the patient felt some dizziness when he stood up without falling or syncope. Because of his persistent symptoms he presented to the emergency department for further evaluation. While he was in the emergency department, the patient had some chest discomfort when he stood up to go to the bathroom lasting less than 1 minute. There was no associated shortness of breath, diaphoresis, palpitations, nausea, or syncope. EKG was sinus rhythm with early repolarization. The patient remained hemodynamically stable throughout his emergency department stay. Neurology was consulted by the ED and recommended transfer to [facility] for further evaluation.    Assessment/Plan: Sensory disturbance -Manifested by dysesthesias to the neck and bilateral arms -08/25/2015 MRI cervical spine small disc herniation C4-5, C6-7 without cord compression; no other acute findings -08/26/2015 MRI brain--negative -08/26/2015 MRA brain--widely patent internal carotid,vertebral and basilar arteries  TSH, vitamin B-12, RPR needs to be collected D-dimer negative, patient has been evaluated by neurology Neurology recommended MRI of the thoracic spine, will also include the lumbar spine Continue Robaxin and add Decadron for back pain Significant improvement in symptoms  overnight  Atypical chest pain -POC troponin neg -serum troponin neg x 1 -EKG without concerning ischemic changes x 2  2-D echo results pending at this time  ?Confusion/dizziness -Patient felt "dazed" on 08/25/15 -no fever,meningismus -presently A&O x 4, fluent speech, without focal neuro deficits -Check orthostatics -tympanic membranes with scarring but No erythema PT evaluation  Family Communication:   Wife by the bedside Disposition Plan:  Continue telemetry       Procedures/Studies: Ct Head Wo Contrast  08/25/2015  CLINICAL DATA:  Confusion and paraesthesias, onset 3 days ago. EXAM: CT HEAD WITHOUT CONTRAST TECHNIQUE: Contiguous axial images were obtained from the base of the skull through the vertex without intravenous contrast. COMPARISON:  03/23/2012 FINDINGS: There is no intracranial hemorrhage, mass or evidence of acute infarction. There is no extra-axial fluid collection. Gray matter and white matter are unremarkable, with normal differentiation. Cavum septum pellucidum incidentally noted. No bony abnormalities evident. Visible paranasal sinuses are clear. IMPRESSION: Normal brain. Electronically Signed   By: Ellery Plunk M.D.   On: 08/25/2015 23:25   Mr Maxine Glenn Head Wo Contrast  08/26/2015  CLINICAL DATA:  Headache. Confusion. Left-sided numbness. Acute presentation. EXAM: MRI HEAD WITHOUT CONTRAST MRA HEAD WITHOUT CONTRAST TECHNIQUE: Multiplanar, multiecho pulse sequences of the brain and surrounding structures were obtained without intravenous contrast. Angiographic images of the head were obtained using MRA technique without contrast. COMPARISON:  Head CT and cervical spine MRI yesterday. MRI brain 02/23/2010. FINDINGS: MRI HEAD FINDINGS Diffusion imaging does not show any acute or subacute infarction. Again demonstrated and unchanged is a slightly indistinct focus of abnormal T2 and FLAIR signal in the dorsal pons to the right of midline at the level of the superior  cerebellar peduncle measuring 3.6 mm in diameter. 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 Brainstem is otherwise normal. Cerebral hemispheres are normal without evidence of old or acute infarction, mass lesion, hemorrhage, hydrocephalus or extra-axial collection. No pituitary mass. No significant sinus disease. No skull or skullbase lesion. Major vessels at base of the brain show flow. Incidental cavum septum pellucidum, not significant. MRA HEAD FINDINGS Both internal carotid arteries widely patent into the brain. The anterior and middle cerebral vessels are normal without proximal stenosis, aneurysm or vascular malformation. Both vertebral arteries are widely patent to the basilar. No basilar stenosis. Posterior circulation branch vessels are normal. IMPRESSION: No acute or likely significant finding. Brain is normal except for a 3-4 mm focus of slightly indistinct T2 signal in the right dorsal pons, unchanged since 2011. This is probably incidental and insignificant and most likely is developmental. Electronically Signed   By: Paulina FusiMark  Shogry M.D.   On: 08/26/2015 07:16   Mr Brain Wo Contrast  08/26/2015  CLINICAL DATA:  Headache. Confusion. Left-sided numbness. Acute presentation. EXAM: MRI HEAD WITHOUT CONTRAST MRA HEAD WITHOUT CONTRAST TECHNIQUE: Multiplanar, multiecho pulse sequences of the brain and surrounding structures were obtained without intravenous contrast. Angiographic images of the head were obtained using MRA technique without contrast. COMPARISON:  Head CT and cervical spine MRI yesterday. MRI brain 02/23/2010. FINDINGS: MRI HEAD FINDINGS Diffusion imaging does not show any acute or subacute infarction. Again demonstrated and unchanged is a slightly indistinct focus of abnormal T2 and FLAIR signal in the dorsal pons to the right of midline at the level of the superior cerebellar peduncle measuring 3.6 mm in diameter. 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 Brainstem is otherwise normal. Cerebral hemispheres are normal without evidence of old or acute infarction, mass lesion, hemorrhage, hydrocephalus or extra-axial collection. No pituitary mass. No significant sinus disease. No skull or skullbase lesion. Major vessels at base of the brain show flow. Incidental cavum septum pellucidum, not significant. MRA HEAD FINDINGS Both internal carotid arteries widely patent into the brain. The anterior and middle cerebral vessels are normal without proximal stenosis, aneurysm or vascular malformation. Both vertebral arteries are widely patent to the basilar. No basilar stenosis. Posterior circulation branch vessels are normal. IMPRESSION: No acute or likely significant finding. Brain is normal except for a 3-4 mm focus of slightly indistinct T2 signal in the right dorsal pons, unchanged since 2011. This is probably incidental and insignificant and most likely is developmental. Electronically Signed   By: Paulina FusiMark  Shogry M.D.   On: 08/26/2015 07:16   Mr Cervical Spine Wo Contrast  08/25/2015  CLINICAL DATA:  Headache, confusion, and left-sided numbness. EXAM: MRI CERVICAL SPINE WITHOUT CONTRAST TECHNIQUE: Multiplanar, multisequence MR imaging of the cervical spine was performed. No intravenous contrast was administered. COMPARISON:  Cervical spine CT 06/27/2008 FINDINGS: No marrow signal abnormality suggestive of fracture, infection, or neoplasm. Normal cord signal. The cord at the upper thoracic level appears diminutive in the AP dimension (5 mm) but transverse diameter is unremarkable at 13 mm. This finding is likely incidental and would not explain symptom onset 3 days ago. Patulous canal size likely contributes to this appearance. C2-3 and C3-4: Negative. C4-5: Disc: Central disc protrusion with cord contact and mild remodeling, but no compression. Facets: Negative. Canal: Patent. Foramina: No impingement. C5-6: Disc:  Small central disc protrusion with cord contact but no compression Facets: Negative. Canal: Patent. Foramina: No impingement. C6-7: Disc: Tiny right paracentral disc protrusion with cord contact but no compression  Facets: Negative. Canal: Patent. Foramina: No impingement. C7-T1: Negative IMPRESSION: 1. No acute finding or cord lesion. 2. The patient had a nonspecific brainstem signal abnormality in 2011. Consider updated brain MRI. 3. 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 Electronically Signed   By: Marnee Spring M.D.   On: 08/25/2015 21:49        Subjective: Significant improvement in paresthesia, neck pain. Denies any headache, chest pain, breast breath, nausea, vomiting, diarrhea, abdominal pain. No visual disturbance.  Objective: Filed Vitals:   08/26/15 0903 08/26/15 1354 08/26/15 2153 08/27/15 0508  BP: 132/88 124/75 126/83 126/88  Pulse: 71 69 73 63  Temp: 98.3 F (36.8 C) 97.8 F (36.6 C) 98.3 F (36.8 C) 98.2 F (36.8 C)  TempSrc: Oral Oral Oral Oral  Resp: Height:      Weight:      SpO2: 100% 98% 98% 99%    Intake/Output Summary (Last 24 hours) at 08/27/15 1142 Last data filed at 08/26/15 1400  Gross per 24 hour  Intake    220 ml  Output      0 ml  Net    220 ml   Weight change:  Exam:   General:  Pt is alert, follows commands appropriately, not in acute distress  HEENT: No icterus, No thrush, No neck mass, [place]/AT  Cardiovascular: RRR, S1/S2, no rubs, no gallops  Respiratory: CTA bilaterally, no wheezing, no crackles, no rhonchi  Abdomen: Soft/+BS, non tender, non distended, no guarding  Extremities: No edema, No lymphangitis, No petechiae, No rashes, no synovitis Neuro:  CN II-XII intact, strength 4/5 in RUE, RLE, strength 4/5 LUE, LLE; sensation intact bilateral; no dysmetria; babinski equivocal   Data Reviewed: Basic Metabolic Panel:  Recent Labs Lab 08/25/15 2006  NA 142  K 3.9  CL 108  CO2 25   GLUCOSE 118*  BUN 15  CREATININE 1.00  CALCIUM 9.0   Liver Function Tests:  Recent Labs Lab 08/25/15 2006  AST 22  ALT 24  ALKPHOS 55  BILITOT 0.5  PROT 6.9  ALBUMIN 3.9   No results for input(s): LIPASE, AMYLASE in the last 168 hours. No results for input(s): AMMONIA in the last 168 hours. CBC:  Recent Labs Lab 08/25/15 2006  WBC 7.1  NEUTROABS 4.0  HGB 13.5  HCT 41.9  MCV 86.6  PLT 203   Cardiac Enzymes:  Recent Labs Lab 08/26/15 0439  TROPONINI <0.03   BNP: Invalid input(s): POCBNP CBG: No results for input(s): GLUCAP in the last 168 hours.  No results found for this or any previous visit (from the past 240 hour(s)).   Scheduled Meds: . dexamethasone  4 mg Intravenous Q12H  . methocarbamol (ROBAXIN)  IV  1,000 mg Intravenous 3 times per day   Continuous Infusions:    Richarda Overlie, MD  [facility] Pager [phone]  If 7PM-7AM, please contact night-coverage [email or web address] Password [facility] 08/27/2015, 11:42 AM

## 2015-08-27 NOTE — Progress Notes (Signed)
Echocardiogram 2D Echocardiogram has been performed.  Dorothey BasemanReel, Kayti Poss M 08/27/2015, 11:54 AM

## 2015-08-28 DIAGNOSIS — R208 Other disturbances of skin sensation: Secondary | ICD-10-CM | POA: Diagnosis not present

## 2015-08-28 DIAGNOSIS — M5440 Lumbago with sciatica, unspecified side: Secondary | ICD-10-CM | POA: Diagnosis not present

## 2015-08-28 DIAGNOSIS — R202 Paresthesia of skin: Secondary | ICD-10-CM | POA: Diagnosis not present

## 2015-08-28 LAB — CBC
HCT: 42 % (ref 39.0–52.0)
Hemoglobin: 14 g/dL (ref 13.0–17.0)
MCH: 28 pg (ref 26.0–34.0)
MCHC: 33.3 g/dL (ref 30.0–36.0)
MCV: 84 fL (ref 78.0–100.0)
Platelets: 210 10*3/uL (ref 150–400)
RBC: 5 MIL/uL (ref 4.22–5.81)
RDW: 12.9 % (ref 11.5–15.5)
WBC: 8.1 10*3/uL (ref 4.0–10.5)

## 2015-08-28 LAB — VITAMIN B12: Vitamin B-12: 314 pg/mL (ref 180–914)

## 2015-08-28 MED ORDER — OXYCODONE HCL 5 MG PO TABS: 5.0000 mg | ORAL_TABLET | Freq: Four times a day (QID) | ORAL | Status: DC | PRN

## 2015-08-28 MED ORDER — SENNOSIDES-DOCUSATE SODIUM 8.6-50 MG PO TABS: 1.0000 | ORAL_TABLET | Freq: Every evening | ORAL | Status: DC | PRN

## 2015-08-28 MED ORDER — METHOCARBAMOL 500 MG PO TABS: 500.0000 mg | ORAL_TABLET | Freq: Three times a day (TID) | ORAL | Status: DC | PRN

## 2015-08-28 MED ORDER — DEXAMETHASONE 4 MG PO TABS: 4.0000 mg | ORAL_TABLET | Freq: Two times a day (BID) | ORAL | Status: DC

## 2015-08-28 NOTE — Discharge Summary (Signed)
Physician Discharge Summary  Danny Brewer MRN: [id number] DOB/AGE: Oct 09, 1970 45 y.o.  PCP: Odette Fraction, MD   Admit date: 08/25/2015 Discharge date: 08/28/2015  Discharge Diagnoses:   Principal Problem:   Paresthesias Active Problems:   Confusion   Sensory disturbance   Atypical chest pain   Back pain    Follow-up recommendations Follow-up with PCP in 3-5 days , including all  additional recommended appointments as below Follow-up CBC, CMP in 3-5 days Please see work restrictions placed in the return to work note      Medication List    STOP taking these medications        ibuprofen 200 MG tablet  Commonly known as:  ADVIL,MOTRIN     naproxen 500 MG tablet  Commonly known as:  NAPROSYN      TAKE these medications        dexamethasone 4 MG tablet  Commonly known as:  DECADRON  Take 1 tablet (4 mg total) by mouth 2 (two) times daily with a meal.     methocarbamol 500 MG tablet  Commonly known as:  ROBAXIN  Take 1 tablet (500 mg total) by mouth every 8 (eight) hours as needed for muscle spasms.     oxyCODONE 5 MG immediate release tablet  Commonly known as:  Oxy IR/ROXICODONE  Take 1 tablet (5 mg total) by mouth every 6 (six) hours as needed for moderate pain, severe pain or breakthrough pain.     senna-docusate 8.6-50 MG tablet  Commonly known as:  Senokot-S  Take 1 tablet by mouth at bedtime as needed for mild constipation.         Discharge Condition: Stable Discharge Instructions Get Medicines reviewed and adjusted: Please take all your medications with you for your next visit with your Primary MD  Please request your Primary MD to go over all hospital tests and procedure/radiological results at the follow up, please ask your Primary MD to get all Hospital records sent to his/her office.  If you experience worsening of your admission symptoms, develop shortness of breath, life threatening emergency, suicidal or homicidal thoughts you must  seek medical attention immediately by calling 911 or calling your MD immediately if symptoms less severe.  You must read complete instructions/literature along with all the possible adverse reactions/side effects for all the Medicines you take and that have been prescribed to you. Take any new Medicines after you have completely understood and accpet all the possible adverse reactions/side effects.   Do not drive when taking Pain medications.   Do not take more than prescribed Pain, Sleep and Anxiety Medications  Special Instructions: If you have smoked or chewed Tobacco in the last 2 yrs please stop smoking, stop any regular Alcohol and or any Recreational drug use.  Wear Seat belts while driving.  Please note  You were cared for by a hospitalist during your hospital stay. Once you are discharged, your primary care physician will handle any further medical issues. Please note that NO REFILLS for any discharge medications will be authorized once you are discharged, as it is imperative that you return to your primary care physician (or establish a relationship with a primary care physician if you do not have one) for your aftercare needs so that they can reassess your need for medications and monitor your lab values.      Discharge Instructions    Diet - low sodium heart healthy    Complete by:  As directed      Increase  activity slowly    Complete by:  As directed            Allergies  Allergen Reactions  . Antihistamines, Diphenhydramine-Type Hives and Other (See Comments)    Seizures  . Demerol Hives  . Morphine And Related Hives    Esp with high dose morphine, possibly Dilaudid Tolerates hydrocodone, oxycodone      Disposition: 01-Home or Self Care   Consults:  Neurology    Significant Diagnostic Studies:  Ct Head Wo Contrast  08/25/2015  CLINICAL DATA:  Confusion and paraesthesias, onset 3 days ago. EXAM: CT HEAD WITHOUT CONTRAST TECHNIQUE: Contiguous axial  images were obtained from the base of the skull through the vertex without intravenous contrast. COMPARISON:  03/23/2012 FINDINGS: There is no intracranial hemorrhage, mass or evidence of acute infarction. There is no extra-axial fluid collection. Gray matter and white matter are unremarkable, with normal differentiation. Cavum septum pellucidum incidentally noted. No bony abnormalities evident. Visible paranasal sinuses are clear. IMPRESSION: Normal brain. Electronically Signed   By: Andreas Newport M.D.   On: 08/25/2015 23:25   Mr Danny Brewer Head Wo Contrast  08/26/2015  CLINICAL DATA:  Headache. Confusion. Left-sided numbness. Acute presentation. EXAM: MRI HEAD WITHOUT CONTRAST MRA HEAD WITHOUT CONTRAST TECHNIQUE: Multiplanar, multiecho pulse sequences of the brain and surrounding structures were obtained without intravenous contrast. Angiographic images of the head were obtained using MRA technique without contrast. COMPARISON:  Head CT and cervical spine MRI yesterday. MRI brain 02/23/2010. FINDINGS: MRI HEAD FINDINGS Diffusion imaging does not show any acute or subacute infarction. Again demonstrated and unchanged is a slightly indistinct focus of abnormal T2 and FLAIR signal in the dorsal pons to the right of midline at the level of the superior cerebellar peduncle measuring 3.6 mm in diameter. 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 Brainstem is otherwise normal. Cerebral hemispheres are normal without evidence of old or acute infarction, mass lesion, hemorrhage, hydrocephalus or extra-axial collection. No pituitary mass. No significant sinus disease. No skull or skullbase lesion. Major vessels at base of the brain show flow. Incidental cavum septum pellucidum, not significant. MRA HEAD FINDINGS Both internal carotid arteries widely patent into the brain. The anterior and middle cerebral vessels are normal without proximal stenosis, aneurysm or vascular  malformation. Both vertebral arteries are widely patent to the basilar. No basilar stenosis. Posterior circulation branch vessels are normal. IMPRESSION: No acute or likely significant finding. Brain is normal except for a 3-4 mm focus of slightly indistinct T2 signal in the right dorsal pons, unchanged since 2011. This is probably incidental and insignificant and most likely is developmental. Electronically Signed   By: Nelson Chimes M.D.   On: 08/26/2015 07:16   Mr Brain Wo Contrast  08/26/2015  CLINICAL DATA:  Headache. Confusion. Left-sided numbness. Acute presentation. EXAM: MRI HEAD WITHOUT CONTRAST MRA HEAD WITHOUT CONTRAST TECHNIQUE: Multiplanar, multiecho pulse sequences of the brain and surrounding structures were obtained without intravenous contrast. Angiographic images of the head were obtained using MRA technique without contrast. COMPARISON:  Head CT and cervical spine MRI yesterday. MRI brain 02/23/2010. FINDINGS: MRI HEAD FINDINGS Diffusion imaging does not show any acute or subacute infarction. Again demonstrated and unchanged is a slightly indistinct focus of abnormal T2 and FLAIR signal in the dorsal pons to the right of midline at the level of the superior cerebellar peduncle measuring 3.6 mm in diameter. 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 Brainstem is otherwise normal.  Cerebral hemispheres are normal without evidence of old or acute infarction, mass lesion, hemorrhage, hydrocephalus or extra-axial collection. No pituitary mass. No significant sinus disease. No skull or skullbase lesion. Major vessels at base of the brain show flow. Incidental cavum septum pellucidum, not significant. MRA HEAD FINDINGS Both internal carotid arteries widely patent into the brain. The anterior and middle cerebral vessels are normal without proximal stenosis, aneurysm or vascular malformation. Both vertebral arteries are widely patent to the basilar. No basilar  stenosis. Posterior circulation branch vessels are normal. IMPRESSION: No acute or likely significant finding. Brain is normal except for a 3-4 mm focus of slightly indistinct T2 signal in the right dorsal pons, unchanged since 2011. This is probably incidental and insignificant and most likely is developmental. Electronically Signed   By: Paulina Fusi M.D.   On: 08/26/2015 07:16   Mr Cervical Spine Wo Contrast  08/25/2015  CLINICAL DATA:  Headache, confusion, and left-sided numbness. EXAM: MRI CERVICAL SPINE WITHOUT CONTRAST TECHNIQUE: Multiplanar, multisequence MR imaging of the cervical spine was performed. No intravenous contrast was administered. COMPARISON:  Cervical spine CT 06/27/2008 FINDINGS: No marrow signal abnormality suggestive of fracture, infection, or neoplasm. Normal cord signal. The cord at the upper thoracic level appears diminutive in the AP dimension (5 mm) but transverse diameter is unremarkable at 13 mm. This finding is likely incidental and would not explain symptom onset 3 days ago. Patulous canal size likely contributes to this appearance. C2-3 and C3-4: Negative. C4-5: Disc: Central disc protrusion with cord contact and mild remodeling, but no compression. Facets: Negative. Canal: Patent. Foramina: No impingement. C5-6: Disc: Small central disc protrusion with cord contact but no compression Facets: Negative. Canal: Patent. Foramina: No impingement. C6-7: Disc: Tiny right paracentral disc protrusion with cord contact but no compression Facets: Negative. Canal: Patent. Foramina: No impingement. C7-T1: Negative IMPRESSION: 1. No acute finding or cord lesion. 2. The patient had a nonspecific brainstem signal abnormality in 2011. Consider updated brain MRI. 3. 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 Electronically Signed   By: Marnee Spring M.D.   On: 08/25/2015 21:49   Mr Thoracic Spine W Wo Contrast  08/27/2015  CLINICAL DATA:  Initial evaluation for  several days of focal paresthesias,, positive left lower extremity Babinski sign. Occasional left-sided sciatica. EXAM: MRI THORACIC AND LUMBAR SPINE WITHOUT AND WITH CONTRAST TECHNIQUE: Multiplanar and multiecho pulse sequences of the thoracic and lumbar spine were obtained without and with intravenous contrast. CONTRAST:  41mL MULTIHANCE GADOBENATE DIMEGLUMINE 529 MG/ML IV SOLN COMPARISON:  None. FINDINGS: MR THORACIC SPINE FINDINGS Vertebral body count made from the C2 level. Vertebral bodies are normally aligned with preservation of the normal thoracic kyphosis. Vertebral body heights are well maintained. Signal intensity within the vertebral body bone marrow is normal. No focal osseous lesions. No marrow edema. No abnormal enhancement. Signal intensity within the thoracic spinal cord is normal. No abnormal enhancement. Paraspinous soft tissues demonstrate no acute abnormality. Trace layering bilateral pleural effusions noted. No abnormal enhancement within the paraspinous soft tissues. T3-4: Mild posterior element hypertrophy without significant stenosis. Faint signal abnormality posterior to the superior aspect of the T4 vertebral body on sagittal STIR sequence not seen on additional sequences, likely artifactual in nature (series 7, image 7). T6-7:  Mild posterior element hypertrophy without stenosis. T7-8:  Posterior element hypertrophy with mild canal stenosis. T8-9: Posterior element hypertrophy, left greater than right. Resultant mild canal stenosis. Minimal flattening of the posterior left hemi cord without cord signal  changes. T9-10: Right greater than left posterior element hypertrophy. Resultant mild canal stenosis. T10-11: Mild ligamentum flavum thickening without significant stenosis. T11-12:  Mild facet arthrosis without significant stenosis. 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 Vertebral bodies normally aligned with preservation of  the normal lumbar lordosis. Vertebral body heights well maintained. No fracture or malalignment. Signal intensity within the vertebral body bone marrow within normal limits. No marrow edema. No abnormal enhancement. Conus medullaris terminates normally at the T12-L1 level. Signal intensity within the visualized cord is normal. Nerve roots of the cauda equina within normal limits. No abnormal enhancement. Paraspinous soft tissues demonstrate no acute abnormality. Visualized visceral structures unremarkable. No retroperitoneal adenopathy. No significant degenerative changes seen through the L2-3 level. No canal or foraminal stenosis. L3-4: Anterior endplate osteophytic spurring with probable mild disc bulge. No significant stenosis. L4-5: Mild to moderate bilateral facet arthrosis. No disc bulge or disc protrusion. No significant stenosis. L5-S1: Rudimentary L5-S1 disc related to transitional lumbosacral anatomy. No stenosis. IMPRESSION: MRI THORACIC SPINE IMPRESSION: 1. Normal MRI appearance of the thoracic spine. No cord signal abnormality or abnormal enhancement. 2. Multilevel posterior element hypertrophy as above, most prevalent at T7-8, and T8-9, and T9-10 for there is resultant mild canal stenosis. 3. Small layering bilateral pleural effusions. MRI LUMBAR SPINE IMPRESSION: 1. Normal MRI appearance of the lumbar spine. No cord signal abnormality within the conus medullaris or cauda equina. 2. Mild to moderate bilateral facet arthrosis at L4-5. No other significant degenerative changes within the lumbar spine. No significant canal or neural foraminal stenosis. 3. 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 Electronically Signed   By: Jeannine Boga M.D.   On: 08/27/2015 22:27   Mr Lumbar Spine W Wo Contrast  08/27/2015  CLINICAL DATA:  Initial evaluation for several days of focal paresthesias,, positive left lower extremity Babinski sign. Occasional left-sided  sciatica. EXAM: MRI THORACIC AND LUMBAR SPINE WITHOUT AND WITH CONTRAST TECHNIQUE: Multiplanar and multiecho pulse sequences of the thoracic and lumbar spine were obtained without and with intravenous contrast. CONTRAST:  16m MULTIHANCE GADOBENATE DIMEGLUMINE 529 MG/ML IV SOLN COMPARISON:  None. FINDINGS: MR THORACIC SPINE FINDINGS Vertebral body count made from the C2 level. Vertebral bodies are normally aligned with preservation of the normal thoracic kyphosis. Vertebral body heights are well maintained. Signal intensity within the vertebral body bone marrow is normal. No focal osseous lesions. No marrow edema. No abnormal enhancement. Signal intensity within the thoracic spinal cord is normal. No abnormal enhancement. Paraspinous soft tissues demonstrate no acute abnormality. Trace layering bilateral pleural effusions noted. No abnormal enhancement within the paraspinous soft tissues. T3-4: Mild posterior element hypertrophy without significant stenosis. Faint signal abnormality posterior to the superior aspect of the T4 vertebral body on sagittal STIR sequence not seen on additional sequences, likely artifactual in nature (series 7, image 7). T6-7:  Mild posterior element hypertrophy without stenosis. T7-8:  Posterior element hypertrophy with mild canal stenosis. T8-9: Posterior element hypertrophy, left greater than right. Resultant mild canal stenosis. Minimal flattening of the posterior left hemi cord without cord signal changes. T9-10: Right greater than left posterior element hypertrophy. Resultant mild canal stenosis. T10-11: Mild ligamentum flavum thickening without significant stenosis. T11-12:  Mild facet arthrosis without significant stenosis. 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 Vertebral bodies normally aligned with preservation of the normal lumbar lordosis. Vertebral body heights well maintained. No fracture or malalignment. Signal  intensity within the vertebral body bone marrow within normal limits. No marrow edema. No abnormal enhancement. Conus medullaris terminates normally at the T12-L1 level. Signal intensity within the visualized cord is normal. Nerve roots of the cauda equina within normal limits. No abnormal enhancement. Paraspinous soft tissues demonstrate no acute abnormality. Visualized visceral structures unremarkable. No retroperitoneal adenopathy. No significant degenerative changes seen through the L2-3 level. No canal or foraminal stenosis. L3-4: Anterior endplate osteophytic spurring with probable mild disc bulge. No significant stenosis. L4-5: Mild to moderate bilateral facet arthrosis. No disc bulge or disc protrusion. No significant stenosis. L5-S1: Rudimentary L5-S1 disc related to transitional lumbosacral anatomy. No stenosis. IMPRESSION: MRI THORACIC SPINE IMPRESSION: 1. Normal MRI appearance of the thoracic spine. No cord signal abnormality or abnormal enhancement. 2. Multilevel posterior element hypertrophy as above, most prevalent at T7-8, and T8-9, and T9-10 for there is resultant mild canal stenosis. 3. Small layering bilateral pleural effusions. MRI LUMBAR SPINE IMPRESSION: 1. Normal MRI appearance of the lumbar spine. No cord signal abnormality within the conus medullaris or cauda equina. 2. Mild to moderate bilateral facet arthrosis at L4-5. No other significant degenerative changes within the lumbar spine. No significant canal or neural foraminal stenosis. 3. 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 Electronically Signed   By: Rise Mu M.D.   On: 08/27/2015 22:27     2-D echo LV EF: 65% - 70%  ------------------------------------------------------------------- Indications: Chest pain 786.51.  ------------------------------------------------------------------- History: PMH: No prior cardiac  history.  ------------------------------------------------------------------- Study Conclusions  - Left ventricle: The cavity size was normal. There was mild  concentric hypertrophy. Systolic function was vigorous. The  estimated ejection fraction was in the range of 65% to 70%. Wall  motion was normal; there were no regional wall motion  abnormalities. - Aortic valve: Trileaflet; normal thickness leaflets. There was no  regurgitation. - Aortic root: The aortic root was normal in size. - Mitral valve: There was trivial regurgitation. - Right ventricle: The cavity size was normal. Wall thickness was  normal. Systolic function was normal. - Tricuspid valve: There was trivial regurgitation. - Pulmonary arteries: Systolic pressure was within the normal  range. - Inferior vena cava: The vessel was normal in size. - Pericardium, extracardiac: There was no pericardial effusion.   Filed Weights   08/25/15 2029  Weight: 83.008 kg (183 lb)     Microbiology: No results found for this or any previous visit (from the past 240 hour(s)).     Blood Culture No results found for: SDES, SPECREQUEST, CULT, REPTSTATUS    Labs: Results for orders placed or performed during the hospital encounter of 08/25/15 (from the past 48 hour(s))  D-dimer, quantitative (not at [facility])     Status: None   Collection Time: 08/27/15  9:13 AM  Result Value Ref Range   D-Dimer, Quant <0.27 0.00 - 0.50 ug/mL-FEU    Comment: (NOTE) At the manufacturer cut-off of 0.50 ug/mL FEU, this assay has been documented to exclude PE with a sensitivity and negative predictive value of 97 to 99%.  At this time, this assay has not been approved by the FDA to exclude DVT/VTE. Results should be correlated with clinical presentation.   Comprehensive metabolic panel     Status: Abnormal   Collection Time: 08/27/15 11:53 AM  Result Value Ref Range   Sodium 141 135 - 145 mmol/L   Potassium 4.0 3.5 - 5.1 mmol/L    Chloride 108 101 - 111 mmol/L   CO2 25  22 - 32 mmol/L   Glucose, Bld 107 (H) 65 - 99 mg/dL   BUN 12 6 - 20 mg/dL   Creatinine, Ser 0.95 0.61 - 1.24 mg/dL   Calcium 8.5 (L) 8.9 - 10.3 mg/dL   Total Protein 5.9 (L) 6.5 - 8.1 g/dL   Albumin 3.3 (L) 3.5 - 5.0 g/dL   AST 19 15 - 41 U/L   ALT 20 17 - 63 U/L   Alkaline Phosphatase 52 38 - 126 U/L   Total Bilirubin 0.3 0.3 - 1.2 mg/dL   GFR calc non Af Amer >60 >60 mL/min   GFR calc Af Amer >60 >60 mL/min    Comment: (NOTE) The eGFR has been calculated using the CKD EPI equation. This calculation has not been validated in all clinical situations. eGFR's persistently <60 mL/min signify possible Chronic Kidney Disease.    Anion gap 8 5 - 15  CBC     Status: None   Collection Time: 08/28/15  6:06 AM  Result Value Ref Range   WBC 8.1 4.0 - 10.5 K/uL   RBC 5.00 4.22 - 5.81 MIL/uL   Hemoglobin 14.0 13.0 - 17.0 g/dL   HCT 42.0 39.0 - 52.0 %   MCV 84.0 78.0 - 100.0 fL   MCH 28.0 26.0 - 34.0 pg   MCHC 33.3 30.0 - 36.0 g/dL   RDW 12.9 11.5 - 15.5 %   Platelets 210 150 - 400 K/uL  Vitamin B12     Status: None   Collection Time: 08/28/15  9:56 AM  Result Value Ref Range   Vitamin B-12 314 180 - 914 pg/mL    Comment: (NOTE) This assay is not validated for testing neonatal or myeloproliferative syndrome specimens for Vitamin B12 levels.      Lipid Panel     Component Value Date/Time   CHOL 186 06/19/2014 1653   TRIG 59 06/19/2014 1653   HDL 52 06/19/2014 1653   CHOLHDL 3.6 06/19/2014 1653   VLDL 12 06/19/2014 1653   LDLCALC 122* 06/19/2014 1653     No results found for: HGBA1C   Lab Results  Component Value Date   LDLCALC 122* 06/19/2014   CREATININE 0.95 08/27/2015     45 year old male who enjoys good health presented with 4 day history of dysesthesias manifested by bilateral arm numbness, tingling, and burning. The patient states that he has similar sensations in his neck that have radiated down to his shoulders,  shoulder blade areas, and to his arms. He also has fleeting sensations of dysesthesias of his bilateral hands. He feels that turning his neck left and right also triggers some of the symptoms. He denies any recent injuries, trauma, or heavy lifting. While he was at church on 08/25/2015, the patient felt some dizziness when he stood up without falling or syncope. Because of his persistent symptoms he presented to the emergency department for further evaluation. While he was in the emergency department, the patient had some chest discomfort when he stood up to go to the bathroom lasting less than 1 minute. There was no associated shortness of breath, diaphoresis, palpitations, nausea, or syncope. EKG was sinus rhythm with early repolarization. The patient remained hemodynamically stable throughout his emergency department stay. Neurology was consulted by the ED and recommended transfer to [facility] for further evaluation.    Assessment/Plan: Sensory disturbance -Manifested by dysesthesias to the neck and bilateral arms -08/25/2015 MRI cervical spine small disc herniation C4-5, C6-7 without cord compression; no other acute findings -08/26/2015  MRI brain--negative -08/26/2015 MRA brain--widely patent internal carotid,vertebral and basilar arteries TSH, vitamin B-12, RPR  Nl MRI thoracic and lumbar spine shows no cord compression and only mild thoracic stenosis D-dimer negative, patient has been evaluated by neurology Continue Robaxin and add Decadron for back pain, Patient to continue Decadron for another 5 days Return to work on 4/3 with restrictions  Atypical chest pain -POC troponin neg -serum troponin neg x 1 -EKG without concerning ischemic changes x 2 2-D echo EF of 65-70%   ?Confusion/dizziness -Patient felt "dazed" on 08/25/15 -no fever,meningismus -presently A&O x 4, fluent speech, without focal neuro deficits Ortho statics negative PT evaluation, No PT follow-up  needed     Discharge Exam:    Blood pressure 129/75, pulse 74, temperature 98.4 F (36.9 C), temperature source Oral, resp. rate 16, height _0  (1.753 m), weight 83.008 kg (183 lb), SpO2 98 %.    General: Pt is alert, follows commands appropriately, not in acute distress  HEENT: No icterus, No thrush, No neck mass, [place]/AT  Cardiovascular: RRR, S1/S2, no rubs, no gallops  Respiratory: CTA bilaterally, no wheezing, no crackles, no rhonchi  Abdomen: Soft/+BS, non tender, non distended, no guarding  Extremities: No edema, No lymphangitis, No petechiae, No rashes, no synovitis  Neuro: CN II-XII intact, strength 4/5 in RUE, RLE, strength 4/5 LUE, LLE; sensation intact bilateral; no dysmetria; babinski equivocal   Follow-up Information    Follow up with [facility], MD. Schedule an appointment as soon as possible for a visit in 3 days.   Specialty:  Family Medicine   Contact information:   [address] [address] [place] [address] [phone]       Signed: Reyne Dumas 08/28/2015, 11:51 AM        Time spent >45 mins

## 2015-08-28 NOTE — Progress Notes (Signed)
NURSING PROGRESS NOTE  Danny BoerRalph Brewer [id number] Discharge Data: 08/28/2015 3:02 PM Attending Provider: Richarda OverlieNayana Abrol, MD QMV:HQIONGE,[id number]:PICKARD,WARREN TOM, MD   Danny Brewer to be D/C'd Home  per MD order.    All IV's will be discontinued and monitored for bleeding.  All belongings will be returned to patient for patient to take home.  Last Documented Vital Signs:  Blood pressure 129/75, pulse 74, temperature 98.4 F (36.9 C), temperature source Oral, resp. rate 16, height 5\' 9"  (1.753 m), weight 83.008 kg (183 lb), SpO2 98 %.  Danny RearLonnie Jan Walters, MSN, RN, [employer]CMSRN

## 2015-08-28 NOTE — Care Management Note (Signed)
Case Management Note  Patient Details  Name: Danny Brewer MRN: [id number] Date of Birth: 08-01-1970  Subjective/Objective:                 Patient in obs for paresthesia.    Action/Plan:  Will DC to home, self care, No CM needs identified.  Expected Discharge Date:                  Expected Discharge Plan:  Home/Self Care  In-House Referral:     Discharge planning Services  CM Consult  Post Acute Care Choice:    Choice offered to:     DME Arranged:    DME Agency:     HH Arranged:    HH Agency:     Status of Service:  Completed, signed off  Medicare Important Message Given:    Date Medicare IM Given:    Medicare IM give by:    Date Additional Medicare IM Given:    Additional Medicare Important Message give by:     If discussed at Long Length of Stay Meetings, dates discussed:    Additional Comments:  Lawerance SabalDebbie Islay Polanco, RN 08/28/2015, 12:18 PM

## 2015-08-28 NOTE — Progress Notes (Signed)
Subjective: patient feels much improved. No numbness today and HA minimal. States the muscle relaxants have helped.   Exam: Filed Vitals:   08/27/15 2153 08/28/15 0505  BP: 125/76 129/75  Pulse: 73 74  Temp: 98.2 F (36.8 C) 98.4 F (36.9 C)  Resp: 16 16    HEENT-  Normocephalic, no lesions, without obvious abnormality.  Normal external eye and conjunctiva.  Normal TM's bilaterally.  Normal auditory canals and external ears. Normal external nose, mucus membranes and septum.  Normal pharynx. Cardiovascular- S1, S2 normal, pulses palpable throughout   Lungs- chest clear, no wheezing, rales, normal symmetric air entry Abdomen- normal findings: bowel sounds normal Extremities- no edema Lymph-no adenopathy palpable Musculoskeletal-no joint tenderness, deformity or swelling Skin-warm and dry, no hyperpigmentation, vitiligo, or suspicious lesions    Gen: In bed, NAD MS: alert and oriented. Speech clear.  CN: 2-12 intact Motor: 5/5 throughout Sensory:intact throughout DTR: 2+ bilateral UE, brisk 3+ bilateral KJ, 2+ bilateral AJ and bilateral down going toes.   Pertinent Labs:  CLINICAL DATA: Initial evaluation for several days of focal paresthesias,, positive left lower extremity Babinski sign. Occasional left-sided sciatica.  EXAM: MRI THORACIC AND LUMBAR SPINE WITHOUT AND WITH CONTRAST  TECHNIQUE: Multiplanar and multiecho pulse sequences of the thoracic and lumbar spine were obtained without and with intravenous contrast.  CONTRAST: 15mL MULTIHANCE GADOBENATE DIMEGLUMINE 529 MG/ML IV SOLN  COMPARISON: None.  FINDINGS: MR THORACIC SPINE FINDINGS  Vertebral body count made from the C2 level.  Vertebral bodies are normally aligned with preservation of the normal thoracic kyphosis. Vertebral body heights are well maintained. Signal intensity within the vertebral body bone marrow is normal. No focal osseous lesions. No marrow edema. No  abnormal enhancement.  Signal intensity within the thoracic spinal cord is normal. No abnormal enhancement.  Paraspinous soft tissues demonstrate no acute abnormality. Trace layering bilateral pleural effusions noted. No abnormal enhancement within the paraspinous soft tissues.  T3-4: Mild posterior element hypertrophy without significant stenosis.  Faint signal abnormality posterior to the superior aspect of the T4 vertebral body on sagittal STIR sequence not seen on additional sequences, likely artifactual in nature (series 7, image 7).  T6-7: Mild posterior element hypertrophy without stenosis.  T7-8: Posterior element hypertrophy with mild canal stenosis.  T8-9: Posterior element hypertrophy, left greater than right. Resultant mild canal stenosis. Minimal flattening of the posterior left hemi cord without cord signal changes.  T9-10: Right greater than left posterior element hypertrophy. Resultant mild canal stenosis.  T10-11: Mild ligamentum flavum thickening without significant stenosis.  T11-12: Mild facet arthrosis without significant stenosis.  MR LUMBAR SPINE FINDINGS  Transitional lumbosacral anatomy with sacralization of the L5 vertebral body. Vertebral bodies normally aligned with preservation of the normal lumbar lordosis. Vertebral body heights well maintained. No fracture or malalignment. Signal intensity within the vertebral body bone marrow within normal limits. No marrow edema. No abnormal enhancement.  Conus medullaris terminates normally at the T12-L1 level. Signal intensity within the visualized cord is normal. Nerve roots of the cauda equina within normal limits. No abnormal enhancement.  Paraspinous soft tissues demonstrate no acute abnormality. Visualized visceral structures unremarkable. No retroperitoneal adenopathy.  No significant degenerative changes seen through the L2-3 level. No canal or foraminal  stenosis.  L3-4: Anterior endplate osteophytic spurring with probable mild disc bulge. No significant stenosis.  L4-5: Mild to moderate bilateral facet arthrosis. No disc bulge or disc protrusion. No significant stenosis.  L5-S1: Rudimentary L5-S1 disc related to transitional lumbosacral anatomy. No stenosis.  IMPRESSION:  MRI THORACIC SPINE IMPRESSION:  1. Normal MRI appearance of the thoracic spine. No cord signal abnormality or abnormal enhancement. 2. Multilevel posterior element hypertrophy as above, most prevalent at T7-8, and T8-9, and T9-10 for there is resultant mild canal stenosis. 3. Small layering bilateral pleural effusions.  MRI LUMBAR SPINE IMPRESSION:  1. Normal MRI appearance of the lumbar spine. No cord signal abnormality within the conus medullaris or cauda equina. 2. Mild to moderate bilateral facet arthrosis at L4-5. No other significant degenerative changes within the lumbar spine. No significant canal or neural foraminal stenosis. 3. 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      Danny MornDavid Garek Schuneman PA-C [facility] [phone]  Impression:  1. Headache. Paraspinal muscles along posterior neck are tender to palpation, suggestive of muscle tension as contributing factor regarding the patient's headache.  2. Small disc herniations seen on MRI at C4-5 to C6-7 without cord compression.  3. MRI brain is normal except for a 3-4 mm focus of slightly indistinct T2 signal in the right dorsal pons, unchanged since 2011. This is probably incidental and insignificant and most likely is developmental.  4) MRI thoracic and lumbar spine shows no cord compression and only mild thoracic stenosis.   Recommendations: 1) B12 and MMA pending 2) At this time treat paraspinal muscle spasms with Baclofen as needed.  No further recommendations from neurology standpoint.     08/28/2015, 9:48 AM

## 2015-08-29 ENCOUNTER — Encounter: Payer: Self-pay | Admitting: Family Medicine

## 2015-08-29 ENCOUNTER — Ambulatory Visit (INDEPENDENT_AMBULATORY_CARE_PROVIDER_SITE_OTHER): Payer: BC Managed Care – PPO | Admitting: Family Medicine

## 2015-08-29 VITALS — BP 134/86 | HR 78 | Temp 98.4°F | Resp 18 | Wt 193.0 lb

## 2015-08-29 DIAGNOSIS — M5030 Other cervical disc degeneration, unspecified cervical region: Secondary | ICD-10-CM

## 2015-08-29 DIAGNOSIS — Z09 Encounter for follow-up examination after completed treatment for conditions other than malignant neoplasm: Secondary | ICD-10-CM

## 2015-08-29 LAB — HOMOCYSTEINE: Homocysteine: 7.8 umol/L (ref 0.0–15.0)

## 2015-08-29 NOTE — Progress Notes (Signed)
Subjective:    Patient ID: Danny Brewer, male    DOB: [birth year], 45 y.o.   MRN: [id number]  HPI  Patient is here today for hospital discharge follow-up. Wednesday he developed a severe headache. By Saturday he was developing numbness and tingling radiating into his left arm and into his left chest. There is also some subjective weakness in his left leg. Patient went to the emergency room on Sunday and was admitted. MRI of the brain was normal. MRA of the brain was normal. Echocardiogram of the heart was normal. CBC, CMP, TSH, vitamin B12 were all normal. Patient underwent MRIs of the cervical, thoracic, and lumbar spine. There was significant for a mildly centrally bulging disc at C4-C5 and C5-C  that were adjacent to the spinal cord yet not impinging upon the spinal cord. This was assumed to be the cause of the patient's neck pain and headaches and paresthesias. He was discharged home pain medication and Decadron. He is here today for follow-up. His mental status is back to normal. The pain in his neck is much better. The paresthesias have resolved. He is questioning when he can go back to work. Past Medical History  Diagnosis Date  . GERD (gastroesophageal reflux disease)   . Kidney stones   . Inguinal hernia 2016    right  . Complication of anesthesia     Pt verble and "prophecizes" after anesthesia   Past Surgical History  Procedure Laterality Date  . Knee surgery    . Hand surgery Left 1987  . Inguinal hernia repair Bilateral 05/27/2015    Procedure: LAPAROSCOPIC BILATERAL  INGUINAL HERNIA REPAIRS WITH MESH;  Surgeon: Karie Soda, MD;  Location: [facility];  Service: General;  Laterality: Bilateral;   Current Outpatient Prescriptions on File Prior to Visit  Medication Sig Dispense Refill  . dexamethasone (DECADRON) 4 MG tablet Take 1 tablet (4 mg total) by mouth 2 (two) times daily with a meal. 10 tablet 0  . methocarbamol (ROBAXIN) 500 MG tablet Take 1 tablet (500 mg total) by mouth  every 8 (eight) hours as needed for muscle spasms. 60 tablet 0  . oxyCODONE (OXY IR/ROXICODONE) 5 MG immediate release tablet Take 1 tablet (5 mg total) by mouth every 6 (six) hours as needed for moderate pain, severe pain or breakthrough pain. 45 tablet 0  . senna-docusate (SENOKOT-S) 8.6-50 MG tablet Take 1 tablet by mouth at bedtime as needed for mild constipation. 30 tablet 1   No current facility-administered medications on file prior to visit.   Allergies  Allergen Reactions  . Antihistamines, Diphenhydramine-Type Hives and Other (See Comments)    Seizures  . Demerol Hives  . Morphine And Related Hives    Esp with high dose morphine, possibly Dilaudid Tolerates hydrocodone, oxycodone   Social History   Social History  . Marital Status: Married    Spouse Name: N/A  . Number of Children: N/A  . Years of Education: N/A   Occupational History  . Not on file.   Social History Main Topics  . Smoking status: Never Smoker   . Smokeless tobacco: Never Used  . Alcohol Use: No  . Drug Use: No  . Sexual Activity: Not on file   Other Topics Concern  . Not on file   Social History Narrative    Review of Systems  All other systems reviewed and are negative.      Objective:   Physical Exam  Constitutional: He is oriented to person, place, and time.  He appears well-developed and well-nourished. No distress.  Neck: Neck supple.  Cardiovascular: Normal rate, regular rhythm and normal heart sounds.   No murmur heard. Pulmonary/Chest: Effort normal and breath sounds normal. No respiratory distress. He has no wheezes. He has no rales.  Abdominal: Soft.  Lymphadenopathy:    He has no cervical adenopathy.  Neurological: He is alert and oriented to person, place, and time. He has normal reflexes. He displays normal reflexes. No cranial nerve deficit. He exhibits normal muscle tone. Coordination normal.  Skin: He is not diaphoretic.  Psychiatric: He has a normal mood and affect.  His behavior is normal. Judgment and thought content normal.  Vitals reviewed.         Assessment & Plan:  Hospital discharge follow-up  DDD (degenerative disc disease), cervical  Symptoms were very numerous.  However all diagnostic testing was essentially normal. He does have some mild cervical degenerative disc disease with herniated disc at C4-C5 and C5-C6. I would like him to take the Decadron and take the rest of the week off and return to work on Monday. As long as his headaches and neck pain are better and paresthesias resolved, I do not feel that further workup is necessary. I also question of possible the patient was having an anxiety attack brought on by the pain in his head and his neck which may have accounted for some of the unusual paresthesias. At the present time, I would like the patient to try to go back to work on Monday unless symptoms worsen.

## 2015-09-02 ENCOUNTER — Encounter: Payer: Self-pay | Admitting: Family Medicine

## 2016-06-09 ENCOUNTER — Emergency Department (HOSPITAL_COMMUNITY): Payer: BC Managed Care – PPO

## 2016-06-09 ENCOUNTER — Encounter (HOSPITAL_COMMUNITY): Payer: Self-pay

## 2016-06-09 ENCOUNTER — Emergency Department (HOSPITAL_COMMUNITY)
Admission: EM | Admit: 2016-06-09 | Discharge: 2016-06-09 | Disposition: A | Discharge status: Home / Self Care | Payer: BC Managed Care – PPO | Attending: Emergency Medicine | Admitting: Emergency Medicine

## 2016-06-09 DIAGNOSIS — R109 Unspecified abdominal pain: Secondary | ICD-10-CM | POA: Diagnosis present

## 2016-06-09 DIAGNOSIS — N200 Calculus of kidney: Secondary | ICD-10-CM | POA: Diagnosis not present

## 2016-06-09 DIAGNOSIS — Z7982 Long term (current) use of aspirin: Secondary | ICD-10-CM | POA: Insufficient documentation

## 2016-06-09 DIAGNOSIS — Z79899 Other long term (current) drug therapy: Secondary | ICD-10-CM | POA: Insufficient documentation

## 2016-06-09 DIAGNOSIS — Z7984 Long term (current) use of oral hypoglycemic drugs: Secondary | ICD-10-CM | POA: Insufficient documentation

## 2016-06-09 DIAGNOSIS — E119 Type 2 diabetes mellitus without complications: Secondary | ICD-10-CM | POA: Insufficient documentation

## 2016-06-09 DIAGNOSIS — N210 Calculus in bladder: Secondary | ICD-10-CM | POA: Insufficient documentation

## 2016-06-09 HISTORY — DX: Type 2 diabetes mellitus without complications: E11.9

## 2016-06-09 LAB — CBC WITH DIFFERENTIAL/PLATELET
Basophils Absolute: 0.1 10*3/uL (ref 0.0–0.1)
Basophils Relative: 1 %
Eosinophils Absolute: 0.1 10*3/uL (ref 0.0–0.7)
Eosinophils Relative: 2 %
HCT: 42.4 % (ref 39.0–52.0)
Hemoglobin: 13.9 g/dL (ref 13.0–17.0)
Lymphocytes Relative: 44 %
Lymphs Abs: 3.2 10*3/uL (ref 0.7–4.0)
MCH: 27.5 pg (ref 26.0–34.0)
MCHC: 32.8 g/dL (ref 30.0–36.0)
MCV: 83.8 fL (ref 78.0–100.0)
Monocytes Absolute: 0.6 10*3/uL (ref 0.1–1.0)
Monocytes Relative: 8 %
Neutro Abs: 3.3 10*3/uL (ref 1.7–7.7)
Neutrophils Relative %: 45 %
Platelets: 189 10*3/uL (ref 150–400)
RBC: 5.06 MIL/uL (ref 4.22–5.81)
RDW: 13.2 % (ref 11.5–15.5)
WBC: 7.2 10*3/uL (ref 4.0–10.5)

## 2016-06-09 LAB — URINALYSIS, ROUTINE W REFLEX MICROSCOPIC
Bacteria, UA: NONE SEEN
Bilirubin Urine: NEGATIVE
Glucose, UA: NEGATIVE mg/dL
Ketones, ur: NEGATIVE mg/dL
Leukocytes, UA: NEGATIVE
Nitrite: NEGATIVE
Protein, ur: NEGATIVE mg/dL
Specific Gravity, Urine: 1.019 (ref 1.005–1.030)
Squamous Epithelial / LPF: NONE SEEN
pH: 6 (ref 5.0–8.0)

## 2016-06-09 LAB — BASIC METABOLIC PANEL
Anion gap: 10 (ref 5–15)
BUN: 12 mg/dL (ref 6–20)
CO2: 23 mmol/L (ref 22–32)
Calcium: 8.9 mg/dL (ref 8.9–10.3)
Chloride: 105 mmol/L (ref 101–111)
Creatinine, Ser: 1.27 mg/dL — ABNORMAL HIGH (ref 0.61–1.24)
GFR calc Af Amer: >60 mL/min (ref >60)
GFR calc non Af Amer: >60 mL/min (ref >60)
Glucose, Bld: 129 mg/dL — ABNORMAL HIGH (ref 65–99)
Potassium: 4.1 mmol/L (ref 3.5–5.1)
Sodium: 138 mmol/L (ref 135–145)

## 2016-06-09 MED ORDER — FENTANYL CITRATE (PF) 100 MCG/2ML IJ SOLN
50.0000 ug | INTRAMUSCULAR | Status: AC | PRN
  Administered 2016-06-09 (×2): 50 ug via INTRAVENOUS
  Filled 2016-06-09 (×2): qty 2

## 2016-06-09 MED ORDER — SODIUM CHLORIDE 0.9 % IV SOLN
Freq: Once | INTRAVENOUS | Status: AC
  Administered 2016-06-09: 1000 mL via INTRAVENOUS

## 2016-06-09 MED ORDER — KETOROLAC TROMETHAMINE 30 MG/ML IJ SOLN
30.0000 mg | Freq: Once | INTRAMUSCULAR | Status: AC
  Administered 2016-06-09: 30 mg via INTRAVENOUS
  Filled 2016-06-09: qty 1

## 2016-06-09 MED ORDER — ONDANSETRON 8 MG PO TBDP: 8.0000 mg | ORAL_TABLET | Freq: Three times a day (TID) | ORAL | 0 refills | Status: DC | PRN

## 2016-06-09 MED ORDER — HYDROCODONE-ACETAMINOPHEN 5-325 MG PO TABS: 1.0000 | ORAL_TABLET | Freq: Four times a day (QID) | ORAL | 0 refills | Status: DC | PRN

## 2016-06-09 MED ORDER — ONDANSETRON HCL 4 MG/2ML IJ SOLN
4.0000 mg | Freq: Once | INTRAMUSCULAR | Status: AC
  Administered 2016-06-09: 4 mg via INTRAVENOUS
  Filled 2016-06-09: qty 2

## 2016-06-09 NOTE — ED Triage Notes (Signed)
Pt recently diagnosed with kidney stones, pt woke up today and had sudden onset on L sided back, flank and abd pain. Vomited x 1, dysuria and hematuria with hesitation.

## 2016-06-09 NOTE — Discharge Instructions (Signed)
Ibuprofen for pain. Norco for severe pain only. Zofran for vomiting. Follow up with family doctor or urology if not improving. Return if worsening symptoms.

## 2016-06-09 NOTE — ED Notes (Signed)
edp notified of second dose of pain medication and at bedside

## 2016-06-09 NOTE — ED Notes (Signed)
Pt assisted to side of bed to try and give a urine sample with the urinal

## 2016-06-09 NOTE — ED Provider Notes (Signed)
[facility]-EMERGENCY DEPT Provider Note   CSN: [id number] Arrival date & time: 06/09/16  0554     History   Chief Complaint Chief Complaint  Patient presents with  . Flank Pain    HPI Danny Brewer is a 46 y.o. male.  HPI Danny Brewer is a 46 y.o. male with history of inguinal hernia, diabetes, kidney stones, presents to emergency department complaining of left flank pain and abdominal pain. Patient's symptoms began suddenly this morning. He reports pain is sharp, constant. Reports associated nausea and vomiting. Had normal bowel movement this morning. Reports that his urine is bloody. Reports history of kidney stones, but this feels slightly different. He states that he feels a lot of "fullness in the abdomen." Denies any recent illness. No fever or chills. No chest pain. No shortness of breath. No treatment prior to coming in.  Past Medical History:  Diagnosis Date  . Complication of anesthesia    Pt verble and "prophecizes" after anesthesia  . Diabetes mellitus without complication (HCC)   . GERD (gastroesophageal reflux disease)   . Inguinal hernia 2016   right  . Kidney stones     Patient Active Problem List   Diagnosis Date Noted  . Back pain   . Paresthesias 08/26/2015  . Confusion 08/26/2015  . Sensory disturbance 08/26/2015  . Atypical chest pain 08/26/2015  . Dizziness and giddiness 06/06/2013  . Viral illness 06/06/2013  . GERD (gastroesophageal reflux disease) 06/06/2013    Past Surgical History:  Procedure Laterality Date  . HAND SURGERY Left 1987  . INGUINAL HERNIA REPAIR Bilateral 05/27/2015   Procedure: LAPAROSCOPIC BILATERAL  INGUINAL HERNIA REPAIRS WITH MESH;  Surgeon: Karie Soda, MD;  Location: [facility];  Service: General;  Laterality: Bilateral;  . KNEE SURGERY         Home Medications    Prior to Admission medications   Medication Sig Start Date End Date Taking? Authorizing Provider  aspirin EC 81 MG tablet Take 81 mg by mouth daily.   Yes  Historical Provider, MD  atorvastatin (LIPITOR) 20 MG tablet Take 20 mg by mouth daily.   Yes Historical Provider, MD  Ergocalciferol (VITAMIN D2) 2000 units TABS Take 2,000 Units by mouth daily.   Yes Historical Provider, MD  lisinopril (PRINIVIL,ZESTRIL) 10 MG tablet Take 10 mg by mouth daily.   Yes Historical Provider, MD  metFORMIN (GLUCOPHAGE) 500 MG tablet Take 500 mg by mouth at bedtime.   Yes Historical Provider, MD  dexamethasone (DECADRON) 4 MG tablet Take 1 tablet (4 mg total) by mouth 2 (two) times daily with a meal. Patient not taking: Reported on 06/09/2016 08/28/15   Richarda Overlie, MD  methocarbamol (ROBAXIN) 500 MG tablet Take 1 tablet (500 mg total) by mouth every 8 (eight) hours as needed for muscle spasms. Patient not taking: Reported on 06/09/2016 08/28/15   Richarda Overlie, MD  oxyCODONE (OXY IR/ROXICODONE) 5 MG immediate release tablet Take 1 tablet (5 mg total) by mouth every 6 (six) hours as needed for moderate pain, severe pain or breakthrough pain. Patient not taking: Reported on 06/09/2016 08/28/15   Richarda Overlie, MD  senna-docusate (SENOKOT-S) 8.6-50 MG tablet Take 1 tablet by mouth at bedtime as needed for mild constipation. Patient not taking: Reported on 06/09/2016 08/28/15   Richarda Overlie, MD    Family History Family History  Problem Relation Age of Onset  . Hypertension Mother   . Cancer Father   . Sudden death Brother   . Hypertension Sister  Social History Social History  Substance Use Topics  . Smoking status: Never Smoker  . Smokeless tobacco: Never Used  . Alcohol use No     Allergies   Antihistamines, diphenhydramine-type; Demerol; and Morphine and related   Review of Systems Review of Systems  Constitutional: Negative for chills and fever.  Respiratory: Negative for cough, chest tightness and shortness of breath.   Cardiovascular: Negative for chest pain, palpitations and leg swelling.  Gastrointestinal: Positive for abdominal pain, nausea and  vomiting. Negative for abdominal distention and diarrhea.  Genitourinary: Positive for flank pain. Negative for dysuria, frequency, hematuria and urgency.  Musculoskeletal: Negative for arthralgias, myalgias, neck pain and neck stiffness.  Skin: Negative for rash.  Allergic/Immunologic: Negative for immunocompromised state.  Neurological: Negative for dizziness, weakness, light-headedness, numbness and headaches.  All other systems reviewed and are negative.    Physical Exam Updated Vital Signs BP (!) 136/102   Pulse 87   Temp 98.2 F (36.8 C) (Oral)   Resp (!) 28   Ht 5\' 9"  (1.753 m)   Wt 86.6 kg   SpO2 100%   BMI 28.21 kg/m   Physical Exam  Constitutional: He appears well-developed and well-nourished. No distress.  HENT:  Head: Normocephalic and atraumatic.  Eyes: Conjunctivae are normal.  Neck: Neck supple.  Cardiovascular: Normal rate, regular rhythm and normal heart sounds.   Pulmonary/Chest: Effort normal. No respiratory distress. He has no wheezes. He has no rales.  Abdominal: Soft. Bowel sounds are normal. He exhibits no distension. There is tenderness. There is no rebound.  Left CVA tenderness. LUQ tenderness  Musculoskeletal: He exhibits no edema.  Neurological: He is alert.  Skin: Skin is warm and dry.  Nursing note and vitals reviewed.    ED Treatments / Results  Labs (all labs ordered are listed, but only abnormal results are displayed) Labs Reviewed  URINALYSIS, ROUTINE W REFLEX MICROSCOPIC - Abnormal; Notable for the following:       Result Value   Hgb urine dipstick MODERATE (*)    All other components within normal limits  BASIC METABOLIC PANEL - Abnormal; Notable for the following:    Glucose, Bld 129 (*)    Creatinine, Ser 1.27 (*)    All other components within normal limits  CBC WITH DIFFERENTIAL/PLATELET    EKG  EKG Interpretation None       Radiology Ct Renal Stone Study  Result Date: 06/09/2016 CLINICAL DATA:  Sudden onset of  left sided back, flank, and abdominal pain. Emesis x 1. EXAM: CT ABDOMEN AND PELVIS WITHOUT CONTRAST TECHNIQUE: Multidetector CT imaging of the abdomen and pelvis was performed following the standard protocol without IV contrast. COMPARISON:  CT of the abdomen and pelvis 10/25/2013 FINDINGS: Lower chest: Mild dependent atelectasis is worse on the left. The heart size is normal. No significant pleural or pericardial effusion is present. Hepatobiliary: No focal liver abnormality is seen. No gallstones, gallbladder wall thickening, or biliary dilatation. Pancreas: Unremarkable. No pancreatic ductal dilatation or surrounding inflammatory changes. Spleen: Normal in size without focal abnormality. Adrenals/Urinary Tract: Adrenal glands are within normal limits bilaterally. There is some stranding about the left kidney. The left ureter is slightly dilated. No obstructing stone is present. The a 2 mm stone lies dependently within the urinary bladder. A 5 mm nonobstructing stone at the lower pole left kidney is slightly larger than on the prior exam. No additional nephrolithiasis is present. Stomach/Bowel: The stomach and duodenum are within normal limits. The small bowel is unremarkable. The  appendix is visualized and normal. The ascending and transverse colon are within normal limits. The descending and rectosigmoid colon are normal. Vascular/Lymphatic: No significant vascular lesions are present. Subcentimeter retroperitoneal lymph nodes are likely inflammatory. Reproductive: Prostate is unremarkable. Other: No significant free fluid or free air is present. Musculoskeletal: Vertebral body heights alignment are maintained. No focal lytic or blastic lesion present. A transitional S1 segment is noted. IMPRESSION: 1. 2 mm deep tendon stone within the urinary bladder and inflammatory changes along the left ureter suggest recent passage of this 2 mm stone. 2. Additional 5 mm nonobstructing stone at the lower pole of the left  kidney likely reflects some interval growth of previously noted left lower pole stone. 3. No additional nephrolithiasis. 4. No other acute or focal abdominal or pelvic pathology. Electronically Signed   By: Marin Roberts M.D.   On: 06/09/2016 07:31    Procedures Procedures (including critical care time)  Medications Ordered in ED Medications  fentaNYL (SUBLIMAZE) injection 50 mcg (50 mcg Intravenous Given 06/09/16 0639)  ondansetron (ZOFRAN) injection 4 mg (4 mg Intravenous Given 06/09/16 0611)  ketorolac (TORADOL) 30 MG/ML injection 30 mg (30 mg Intravenous Given 06/09/16 0722)  0.9 %  sodium chloride infusion (1,000 mLs Intravenous New Bag/Given 06/09/16 0730)     Initial Impression / Assessment and Plan / ED Course  I have reviewed the triage vital signs and the nursing notes.  Pertinent labs & imaging results that were available during my care of the patient were reviewed by me and considered in my medical decision making (see chart for details).  Clinical Course    Pt with left flank and left upper abdominal pain onset this morning. Hx of kindney stones. Abdomen benign. Will check labs, UA, CT abd/pelvis renal. Pt received 2x46mcg of fentanyl, will add toradol.   8:41 AM CT scan showing passed stone in the bladder. Patient is feeling better. Urine with no signs of infection. Plan to discharge home with close outpatient follow-up. We'll give 6 tablets of Norco in case pain returns. Patient was informed about intrarenal stone on the left as well. All questions answered. He is comfortable going home.  Vitals:   06/09/16 0700 06/09/16 0730 06/09/16 0807 06/09/16 0815  BP: 115/86 112/76 100/57 107/68  Pulse: 75 68 71 66  Resp: 22 22 11 12   Temp:      TempSrc:      SpO2: 98% 98% 100% 99%  Weight:      Height:         Final Clinical Impressions(s) / ED Diagnoses   Final diagnoses:  Kidney stone    New Prescriptions New Prescriptions   HYDROCODONE-ACETAMINOPHEN (NORCO)  5-325 MG TABLET    Take 1 tablet by mouth every 6 (six) hours as needed for moderate pain.   ONDANSETRON (ZOFRAN ODT) 8 MG DISINTEGRATING TABLET    Take 1 tablet (8 mg total) by mouth every 8 (eight) hours as needed for nausea or vomiting.     Jaynie Crumble, PA-C 06/09/16 1454    Shaune Pollack, MD 06/11/16 1016

## 2016-06-09 NOTE — ED Notes (Signed)
Pt comfortable with discharge and follow up instructions. Pt declines wheelchair, escorted to waiting area by this RN. Rx x2 

## 2017-03-31 ENCOUNTER — Encounter (INDEPENDENT_AMBULATORY_CARE_PROVIDER_SITE_OTHER): Payer: Self-pay

## 2017-03-31 ENCOUNTER — Encounter: Payer: Self-pay | Admitting: Neurology

## 2017-03-31 ENCOUNTER — Ambulatory Visit (INDEPENDENT_AMBULATORY_CARE_PROVIDER_SITE_OTHER): Payer: BC Managed Care – PPO | Admitting: Neurology

## 2017-03-31 VITALS — BP 135/83 | HR 109 | Resp 18 | Ht 69.0 in | Wt 184.5 lb

## 2017-03-31 DIAGNOSIS — R0683 Snoring: Secondary | ICD-10-CM | POA: Diagnosis not present

## 2017-03-31 DIAGNOSIS — R42 Dizziness and giddiness: Secondary | ICD-10-CM | POA: Diagnosis not present

## 2017-03-31 DIAGNOSIS — R404 Transient alteration of awareness: Secondary | ICD-10-CM

## 2017-03-31 DIAGNOSIS — R930 Abnormal findings on diagnostic imaging of skull and head, not elsewhere classified: Secondary | ICD-10-CM | POA: Diagnosis not present

## 2017-03-31 DIAGNOSIS — G4719 Other hypersomnia: Secondary | ICD-10-CM

## 2017-03-31 MED ORDER — RIZATRIPTAN BENZOATE 10 MG PO TBDP: 10.0000 mg | ORAL_TABLET | ORAL | 11 refills | Status: DC | PRN

## 2017-03-31 NOTE — Progress Notes (Signed)
[facility]  PATIENT: Danny Brewer DOB: Nov 06, 1970  REFERRING DOCTOR OR PCP:  Lynnea FerrierWarren Brewer is PCP; referred by Danny EveryJeffrey Brewer SOURCE: Patient, notes from Dr. [name], multiple MRI and CT reports and images on PACS  _________________________________   HISTORICAL  CHIEF COMPLAINT:  Chief Complaint  Patient presents with  . Abnormal MRI    Here to discuss MRI brain results. Was seeing [facility] for his right shoulder--incidental finding of abnormality on MRI brain./fim    HISTORY OF PRESENT ILLNESS:  I [facility] the [facility] you [facility] Danny Brew[facility] neurologic consultation regarding his abnormal brain MRI, headaches and episodes of short-term memory issues.  He was experiencing neck and right shoulder pain and had an MRI of the cervical spine 03/11/2017. It showed degenerative changes at C4-C5, C5-C6 and C6-C7 but also showed a small T2 hyperintense focus within the right dorsolateral pons. He was referred for further evaluation.  He has had problems with his balance off and on x years.    He has had episodes of confusion and short term memory issues lasting a couple minutes.   There is no associated loss of consciousness or shaking.    These have also occurred multiple times over the past decade with an especially severe episode in March 2017.   While in [place], he had the onset of right sided numbness and poor balance.  He also had a bad headache.    He went to the ED and had an MRI showing a small 3-4 mm focus in the left posterior pons.     The MR angiogram was normal.   He was admitted to the hospital and discharged 3 days later.  He is experiencing  headaches bilaterally.   He had two severe headaches with occipital pain over the past month and averaging once or twice a month..  Equilibrium has been off with the headache some of the times.    He has milder headache more regularly.   He gets photophobia but not nausea  or vomiting.    He has gone to the ED a few times due to the headache.    I reviewed the MRI of the cervical spine from 03/11/2017 showing degenerative changes at C4-C5 through C6-C7 and also showing the small pontine focus .   I reviewed the MRI of the brain dated 08/25/2015 and 02/23/2010. There is a stable 3-4 mm focus in the posterior right pons near the middle cerebellar peduncle. It is unchanged in size or signal characteristics. Additionally there is some ethmoid and left maxillary chronic sinusitis. 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   Additionally, I went back to look at some earlier CT scans. The focus appears to be present on the 05/17/2005 and the 06/27/2008 CT.  He has excessive daytime sleepiness and dozes off many times a day according to his wife. He snores, worse on his back is wife has heard him snore or gasp a few times. He had a sleep study more than 5 years ago but they do not know what the results were.     EPWORTH SLEEPINESS SCALE  On a scale of 0 - 3 what is the chance of dozing:  Sitting and Reading:   3 Watching TV:    3 Sitting inactive in a public place: 3 Passenger in car for one hour: 3 Lying down to rest in the afternoon: 3 Sitting and talking to someone: 1 Sitting quietly after lunch:  2 In  a car, stopped in traffic:  0  Total (out of 24):   18/24 (moderately severe EDS)   REVIEW OF SYSTEMS: Constitutional: No fevers, chills, sweats, or change in appetite.   He has excessive sleepiness Eyes: No visual changes, double vision, eye pain Ear, nose and throat: No hearing loss, ear pain, nasal congestion, sore throat Cardiovascular: No chest pain, palpitations Respiratory: No shortness of breath at rest or with exertion.   No wheezes.  He snores GastrointestinaI: No nausea, vomiting, diarrhea, abdominal pain, fecal incontinence Genitourinary: No dysuria, urinary retention or frequency.  No nocturia. Musculoskeletal: No neck pain, back pain Integumentary:  No rash, pruritus, skin lesions Neurological: as above Psychiatric: No depression at this time.  No anxiety Endocrine: No palpitations, diaphoresis, change in appetite, change in weigh or increased thirst Hematologic/Lymphatic: No anemia, purpura, petechiae. Allergic/Immunologic: No itchy/runny eyes, nasal congestion, recent allergic reactions, rashes  ALLERGIES: Allergies  Allergen Reactions  . Antihistamines, Diphenhydramine-Type Hives and Other (See Comments)    Seizures  . Demerol Hives  . Morphine And Related Hives    Esp with high dose morphine, possibly Dilaudid Tolerates hydrocodone, oxycodone    HOME MEDICATIONS:  Current Outpatient Prescriptions:  .  atorvastatin (LIPITOR) 20 MG tablet, Take 20 mg by mouth daily., Disp: , Rfl:  .  lisinopril (PRINIVIL,ZESTRIL) 10 MG tablet, Take 10 mg by mouth daily., Disp: , Rfl:  .  metFORMIN (GLUCOPHAGE) 500 MG tablet, Take 500 mg by mouth at bedtime., Disp: , Rfl:  .  rizatriptan (MAXALT-MLT) 10 MG disintegrating tablet, Take 1 tablet (10 mg total) by mouth as needed for migraine. May repeat in 2 hours if needed, Disp: 9 tablet, Rfl: 11  PAST MEDICAL HISTORY: Past Medical History:  Diagnosis Date  . Complication of anesthesia    Pt verble and "prophecizes" after anesthesia  . Diabetes mellitus without complication (HCC)   . GERD (gastroesophageal reflux disease)   . Inguinal hernia 2016   right  . Kidney stones   . Type 1 diabetes (HCC)   . Vision abnormalities     PAST SURGICAL HISTORY: Past Surgical History:  Procedure Laterality Date  . HAND SURGERY Left 1987  . INGUINAL HERNIA REPAIR Bilateral 05/27/2015   Procedure: LAPAROSCOPIC BILATERAL  INGUINAL HERNIA REPAIRS WITH MESH;  Surgeon: Karie Soda, MD;  Location: [facility];  Service: General;  Laterality: Bilateral;  . KNEE SURGERY      FAMILY HISTORY: Family History  Problem Relation Age of Onset  . Hypertension Mother   . Cancer Father   . Heart failure Father     . Sudden death Brother   . Heart attack Brother   . Hypertension Sister   . Heart murmur Sister     SOCIAL HISTORY:  Social History   Social History  . Marital status: Married    Spouse name: N/A  . Number of children: N/A  . Years of education: N/A   Occupational History  . Not on file.   Social History Main Topics  . Smoking status: Never Smoker  . Smokeless tobacco: Never Used  . Alcohol use No  . Drug use: No  . Sexual activity: Not on file   Other Topics Concern  . Not on file   Social History Narrative  . No narrative on file     PHYSICAL EXAM  Vitals:   03/31/17 1409  BP: 135/83  Pulse: (!) 109  Resp: 18  Weight: 184 lb 8 oz (83.7 kg)  Height: 5\' 9"  (1.753  m)    Body mass index is 27.25 kg/m.   General: The patient is well-developed and well-nourished and in no acute distress.  Pharynx is Mallampati 2  Eyes:  Funduscopic exam shows normal optic discs and retinal vessels.  Neck: The neck is supple, no carotid bruits are noted.  The neck is nontender with slightly reduced ROM.    Cardiovascular: The heart has a regular rate and rhythm with a normal S1 and S2. There were no murmurs, gallops or rubs. Lungs are clear to auscultation.  Skin: Extremities are without significant edema.  Musculoskeletal:  Back is nontender  Neurologic Exam  Mental status: The patient is alert and oriented x 3 at the time of the examination. The patient has apparent normal recent and remote memory, with an apparently normal attention span and concentration ability.   Speech is normal.  Cranial nerves: Extraocular movements are full. Pupils are equal, round, and reactive to light and accomodation.  Visual fields are full.  Facial symmetry is present. There is good facial sensation to soft touch bilaterally.Facial strength is normal.  Trapezius and sternocleidomastoid strength is normal. No dysarthria is noted.  The tongue is midline, and the patient has symmetric  elevation of the soft palate. No obvious hearing deficits are noted.  Motor:  Muscle bulk is normal.   Tone is normal. Strength is  5 / 5 in all 4 extremities.   Sensory: Sensory testing is intact to pinprick, soft touch and vibration sensation in all 4 extremities.  Coordination: Cerebellar testing reveals good finger-nose-finger and heel-to-shin bilaterally.  Gait and station: Station is normal.   Gait is normal. Tandem gait is normal. Romberg is negative.   Reflexes: Deep tendon reflexes are symmetric and normal bilaterally.   Plantar responses are flexor.    DIAGNOSTIC DATA (LABS, IMAGING, TESTING) - I reviewed patient records, labs, notes, testing and imaging myself where available.  Lab Results  Component Value Date   WBC 7.2 06/09/2016   HGB 13.9 06/09/2016   HCT 42.4 06/09/2016   MCV 83.8 06/09/2016   PLT 189 06/09/2016      Component Value Date/Time   NA 138 06/09/2016 0610   K 4.1 06/09/2016 0610   CL 105 06/09/2016 0610   CO2 23 06/09/2016 0610   GLUCOSE 129 (H) 06/09/2016 0610   BUN 12 06/09/2016 0610   CREATININE 1.27 (H) 06/09/2016 0610   CREATININE 0.93 12/18/2014 1157   CALCIUM 8.9 06/09/2016 0610   PROT 5.9 (L) 08/27/2015 1153   ALBUMIN 3.3 (L) 08/27/2015 1153   AST 19 08/27/2015 1153   ALT 20 08/27/2015 1153   ALKPHOS 52 08/27/2015 1153   BILITOT 0.3 08/27/2015 1153   GFRNONAA >60 06/09/2016 0610   GFRNONAA >89 12/18/2014 1157   GFRAA >60 06/09/2016 0610   GFRAA >89 12/18/2014 1157   Lab Results  Component Value Date   CHOL 186 06/19/2014   HDL 52 06/19/2014   LDLCALC 122 (H) 06/19/2014   TRIG 59 06/19/2014   CHOLHDL 3.6 06/19/2014   No results found for: HGBA1C Lab Results  Component Value Date   VITAMINB12 314 08/28/2015   Lab Results  Component Value Date   TSH 3.969 06/06/2013       ASSESSMENT AND PLAN  Abnormal MRI of head - Plan: EEG adult  Dizziness and giddiness  Transient alteration of awareness - Plan: EEG  adult  Snoring - Plan: Split night study  Excessive daytime sleepiness - Plan: Split night study   In  summary, Mr. Oquinn is a 46 year old man with an abnormal MRI showing a 3-4 mm focus in the right posterior pons also has frequent headaches and a history of transient alteration of awareness intermittently. Additionally, he has excessive daytime sleepiness and snoring.  I reviewed multiple MRIs and CT scans. There is a 3-4 mm focus in the right posterior pons that was seen on the recent cervical spine MRI but is also present on brain MRIs from 2017 and 2011. Furthermore, I saw it on CT scans back to 2006.   The etiology is uncertain. It could be congenital or be a focus of remote ischemic or demyelinating change. Regardless of the etiology, as there are no other foci of the brain and it has been present for over a decade I do not think any further evaluation is indicated.  For the headaches, Maxalt was called in.    He has had some episodes of confusion lasting a couple minutes. We will check an EEG to determine if there is any evidence of seizure activity.  He has excessive daytime sleepiness and snoring. We will check an sleep study to determine if he has obstructive sleep apnea  I will see him back as needed after the studies. He also should call us if he has any new or worsening neurologic symptoms.  Cardiovascular see Mr. Iannello. Please let me know if I can be of further assistance with MRI of the patient's the future.   Richard A. Epimenio Foot, MD, [facility] 03/31/2017, 5:32 PM Certified in Neurology, Clinical Neurophysiology, Sleep Medicine, Pain Medicine and Neuroimaging  [facility] [address], [address] [place], [place] [address] [phone]

## 2017-04-19 ENCOUNTER — Ambulatory Visit: Payer: BC Managed Care – PPO | Admitting: Neurology

## 2017-04-19 ENCOUNTER — Encounter: Payer: Self-pay | Admitting: Neurology

## 2017-04-19 ENCOUNTER — Telehealth: Payer: Self-pay | Admitting: Neurology

## 2017-04-19 DIAGNOSIS — R930 Abnormal findings on diagnostic imaging of skull and head, not elsewhere classified: Secondary | ICD-10-CM

## 2017-04-19 DIAGNOSIS — R404 Transient alteration of awareness: Secondary | ICD-10-CM

## 2017-04-19 DIAGNOSIS — R410 Disorientation, unspecified: Secondary | ICD-10-CM | POA: Diagnosis not present

## 2017-04-19 NOTE — Progress Notes (Signed)
   [facility]  EEG (ELECTROENCEPHALOGRAM) REPORT   STUDY DATE: 04/19/2017 PATIENT NAME: Danny Brewer DOB: 15-Mar-1971 MRN: [id number]  ORDERING CLINICIAN: Tejuan Gholson A. Epimenio FootSater, MD. PhD  TECHNOLOGIST: Gearldine ShownLorraine Jones TECHNIQUE: Electroencephalogram was recorded utilizing standard 10-20 system of lead placement and reformatted into average and bipolar montages.  RECORDING TIME: 25 minutes  CLINICAL INFORMATION: 46 year old man with episodes of confusion  FINDINGS: A digital EEG was performed while the patient was awake and drowsy. While awake and most alert there was a 10 hz posterior dominant rhythm. Voltages and frequencies were symmetric.  There were no focal, lateralizing, epileptiform activity or seizures seen.  Photic stimulation showed a normal driving response. Hyperventilation and recovery did not change the underlying rhythms. EKG channel shows normal sinus rhythm.    The patient became drowsy but did not have sleep recorded.  IMPRESSION: This is a normal EEG for the patient was awake and drowsy.   INTERPRETING PHYSICIAN:   Mindy Gali A. Epimenio FootSater, MD, PhD, [facility] Certified in Neurology, Clinical Neurophysiology, Sleep Medicine, Pain Medicine and Neuroimaging  [facility] [address], [address] [place], [place] [address] [phone]

## 2017-04-19 NOTE — Telephone Encounter (Signed)
Spoke with pt. this afternoon.  He c/o soreness to top of head, only with touch, onset yesterday.  No known injury, no bruising, edema.  He is wearing a hat, which helps.  Will have EEG as planned this afternoon, and continue to monitor pain; call back if it worsens, does not resolve/fim

## 2017-04-19 NOTE — Telephone Encounter (Signed)
Pt called to confirm EEG appt time today. He also said he is having pain in the head to the touch but it is also a constant throbbing pain since Friday afternoon. Pt said he thought it was a migraine but it's not. Pt said when he shaved his head yesterday it was painful to the touch. Please call to discuss

## 2017-05-05 ENCOUNTER — Ambulatory Visit (INDEPENDENT_AMBULATORY_CARE_PROVIDER_SITE_OTHER): Payer: BC Managed Care – PPO | Admitting: Neurology

## 2017-05-05 DIAGNOSIS — G4710 Hypersomnia, unspecified: Secondary | ICD-10-CM

## 2017-05-05 DIAGNOSIS — G4719 Other hypersomnia: Secondary | ICD-10-CM

## 2017-05-05 DIAGNOSIS — R0683 Snoring: Secondary | ICD-10-CM

## 2017-05-07 NOTE — Progress Notes (Signed)
PATIENT'S NAME:  Danny Brewer, Danny Brewer DOB:      1971-04-11      MR#:    [id number]     DATE OF RECORDING: 05/05/2017 REFERRING M.D.:  [name], MD Study Performed:   Baseline Polysomnogram HISTORY:  Abnormal MRI of head, GERD, Diabetes, and Snoring.  The patient endorsed the Epworth Sleepiness Scale at 18/24 points.    The patient's weight 184 pounds with a height of 69 (inches), resulting in a BMI of 27.1 kg/m2.  The patient's neck circumference measured 16 inches.  CURRENT MEDICATIONS: Lipitor, Prinivil, Glucophage, Maxalt.   PROCEDURE:  This is a multichannel digital polysomnogram utilizing the Somnostar 11.2 system.  Electrodes and sensors were applied and monitored per AASM Specifications.   EEG, EOG, Chin and Limb EMG, were sampled at 200 Hz.  ECG, Snore and Nasal Pressure, Thermal Airflow, Respiratory Effort, CPAP Flow and Pressure, Oximetry was sampled at 50 Hz. Digital video and audio were recorded.      BASELINE STUDY  Lights Out was at 22:08 and Lights On at 04:50.  Total recording time (TRT) was 402 minutes, with a total sleep time (TST) of  363 minutes.   The patient's sleep latency was 0 minutes.  REM latency was 53 minutes.  The sleep efficiency was 90.3 %.     SLEEP ARCHITECTURE: WASO (Wake after sleep onset) was 23 minutes.  There were 9.5 minutes in Stage N1, 222.5 minutes Stage N2, 39.5 minutes Stage N3 and 91.5 minutes in Stage REM.  The percentage of Stage N1 was 2.6%, Stage N2 was 61.3%, Stage N3 was 10.9% and Stage R (REM sleep) was 25.2%.   The arousals were noted as: 15 were spontaneous, 0 were associated with PLMs, 7 were associated with respiratory events.  Audio and video analysis did not show any abnormal or unusual movements, behaviors, phonations or vocalizations.  Moderate snoring was noted.  EKG was in keeping with normal sinus rhythm (NSR).  RESPIRATORY ANALYSIS:  There were a total of 15 respiratory events:  1 obstructive apneas, 3 central apneas and  1 mixed apneas with a total of 5 apneas and an apnea index (AI) of .8 /hour. There were 10 hypopneas with a hypopnea index of 1.7 /hour. The patient also had 0 respiratory event related arousals (RERAs).      The total APNEA/HYPOPNEA INDEX (AHI) was 2.5/hour and the total RESPIRATORY DISTURBANCE INDEX was 2.5 /hour.  14 events occurred in REM sleep and 1 events in NREM. The REM AHI was 9.2 /hour, versus a non-REM AHI of .2. The patient spent 159 minutes of total sleep time in the supine position and 204 minutes in non-supine.. The supine AHI was 4.5 versus a non-supine AHI of 0.9.  OXYGEN SATURATION & C02:  The Wake baseline 02 saturation was 0%, with the lowest being 90%. Time spent below 89% saturation equaled 0 minutes.  PERIODIC LIMB MOVEMENTS:   The patient had a total of 5 Periodic Limb Movements.  The Periodic Limb Movement (PLM) index was .8 and the PLM Arousal index was 0/hour.   IMPRESSION:  1. 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 No significant periodic limb movements of sleep  RECOMMENDATIONS:  1. The OSA is not severe enough for CPAP. 2. Consider an oral appliance or weight loss for the snoring if it is troublesome 3. Follow up with Dr. Epimenio FootSater   I certify that I have reviewed the  entire raw data recording prior to the issuance of this report in accordance with the Standards of Accreditation of the American Academy of Sleep Medicine (AASM)   Rodd Heft A. Epimenio Foot, MD, PhD, FAAN Certified in Neurology, Clinical Neurophysiology, Sleep Medicine, Pain Medicine and Neuroimaging Director, [facility]  [facility] [address], [address] [place], [place] [address] [phone]      Demographics and Medical History           Name: Danny Brewer, Danny Brewer Age: 46 BMI: 27.1 Interp Physician: Despina Arias, MD  DOB: 22-Jan-1971 Ht-IN: 69 CM: 175 Referred By: Jene Every,  MD  Pt. Tag:  Wt-LB: 184 KG: 83 Tested By: Holly Bodily, RPSGT  Pt. #: [id number] Sex: Male Scored By: Holly Bodily, RPSGT  Bed Tag: [id number] Race: African American Occupation: ---  Indication for PS: ---   Sleep Summary    Sleep Time Statistics Minutes Hours    Time in Bed 402    6.7    Total Sleep Time 363    6.1    Total Sleep Time NREM 271.5    4.5    Total Sleep Time REM 91.5    1.5    Sleep Onset 0    0.0    Wake After Sleep Onset 23    0.4    Wake After Sleep Period 16    0.3    Latency Persistent Sleep 0    0.0    Sleep Efficiency 90.3 Percent    Lights out 22:08     Lights on 04:50    Sleep Disruption Events Count Index    Arousals 33 5.5    Awakenings 0 0    Arousals + Awakenings 33 5.5    REM Awakenings 3 0.5     Sleep Stage Statistics Wake N1 N2 N3 REM    Percent Stage to SPT 6.0  2.5  57.6  10.2  23.7  Percent   Sleep Period Time in Stage 23 9.5 222.5 39.5 91.5 Minutes   Latency to Stage  64.5 0 14.5 53 Minutes   Percent Stage to TST  2.6 61.3 10.9 25.2 Percent   EKG Summary          EKG Statistics         Heart Rate, Wake 0 BPM  TST Epochs in HR Interval 0 < 29   Heart Rate, Steady Sleep Avg 77 BPM   0 30-59   PAC Events 0 Count   601 60-79   PVC Events 0 Count   121 80-99   Bradycardia 0 Count   3 100-119   Tachycardia 0 Count   0 120-139        1 140-159    NREM REM   0 > 160   Shortest R-R .4 .6       Longest R-R .9 .9        Respiration Summary  Event Statistics Total  With Arousal  With Awakening    Count Index  Count Index  Count Index   Apneas, Total 5 .8  2 0.3   0 0.0    Hypopneas, Total 10 1.7  5 0.8   0 0.0    Apnea + Hypopnea Index 15 2.5   7 1.2   0 0.0    Apneas, Supine 4 1.5     Apneas, Non Supine 1 .3     Hypopneas, Supine 8 3.     Hypopneas, Non  Supine 2 .6     % Sleep Apnea .2 Percent     % Sleep Hypopnea .5 Percent    Oximetry Statistics       SpO2, Mean Wake 0 Percent     SpO2, Minimum 90 Percent     SpO2, Max 98  Percent     SpO2, Mean 95 Percent            Desaturation Index, REM 6.6  Index     Desaturation Index, NREM 0.2  Index     Desaturation Index, Total 2.0  Index             SpO2 Intervals > 89% 80-89% 70-79% 60-69% 50-59% 40-49% 30-39% < 30%  363 Percent Sleep Time 100 0 0 0 0 0 0 0  Body Position Statistics   Back Side L Side R Side Prone    Total Sleep Time   159 204.0 145 59 0 Minutes   Percent Time to TST   43.8  56.2  39.9  16.3  0.0  Percent   Number of Events   12 3.0 3 0 0 Count   Number of Apneas   4 1 1  0 0 Count   Number of Hypopneas   8 2 2  0 0 Count   Apnea Index   1.5  0.3  0.4  0.0  0.0  Index   Hypopnea Index   3.0  0.6  0.8  0.0  0.0  Index   Apnea + Hypopnea Index   4.5  0.9  1.2  0.0  0.0  Index  Respiration Events    Non REM, Pre Rx Statistics Non Supine  Supine    Central Mixed Obstr  Central Mixed Obstr   Apneas 0 0 0  1 0 0 Count  Apneas, Minimum SpO2 0 0 0  94 0 93 Percent     Hypopneas 0 0 0  0 0 0 Count  Hypopneas, Minimum SpO2 0 0 0  0 0 0 Percent     Apnea + Hypopneas Index 0.0 0.0 0.0  0.5 0.0 0.0 Index    REM, Pre Rx Statistics Non Supine  Supine    Central Mixed Obstr  Central Mixed Obstr   Apneas 1 0 0  1 1 1  Count  Apneas, Minimum SpO2 97 0 0  95 93 96 Percent     Hypopneas 0 0 2  0 0 8 Count  Hypopneas, Minimum SpO2 0 0 94  0 0 93 Percent     Apnea + Hypopnea Index 1.0 0.0 2.0  1.9 1.9 16.9 Index  Leg Movement Summary    PLM Non REM (Incl. Wake) REM Total    No Arousal Arousal Wake No Arousal Arousal Wake No Arousal Arousal Wake Total   Isolated 6 8 0 0 3 0 6 11 0 17    PLMS 5 0 0 0 0 0 5 0 0 5    Total 11 8 0 0 3 0 11 11 0 22   PLM Statistics PLMS Total     Count Index Count Index    PLM 5 .8 22 3.6     PLM with Arousal 0 0 11 1.8     PLM, with Wake 0 0 0 0.0     PLM, Arousal + Wake 0 0.0 11 1.8     PLM, No Arousal 5 0.8  11 1.8     PLM, Non REM 5 1.1  19 4.2  PLM, REM 0 0.0  3 2.0      Technician Comments:  PSG study performed on patient and scored with 3% desats. Patient did not qualify for split night study due to inadequate AHI. Patient slept on sides and supine positions. No nocturia. Moderate snoring.

## 2017-05-10 ENCOUNTER — Telehealth: Payer: Self-pay | Admitting: *Deleted

## 2017-05-10 NOTE — Telephone Encounter (Signed)
-----   Message from Asa Lenteichard A Sater, MD sent at 05/07/2017  2:32 PM EST ----- Please let him know that he just had a very little amount of sleep apnea, not bad enough to use CPAP.   If the snoring is troublesome, we could refer her for an oral appliance.

## 2017-05-10 NOTE — Telephone Encounter (Signed)
Spoke with Rayna SextonRalph this am and reviewed below sleep study results.  He verbalized understanding of same, sts. his sister is a Education officer, communitydentist who has already said she can make him an oral appliance.  He will let us know if he needs anything else from us./fim

## 2017-05-19 ENCOUNTER — Emergency Department (HOSPITAL_COMMUNITY)
Admission: EM | Admit: 2017-05-19 | Discharge: 2017-05-19 | Disposition: A | Discharge status: Home / Self Care | Payer: Worker's Compensation | Attending: Emergency Medicine | Admitting: Emergency Medicine

## 2017-05-19 ENCOUNTER — Encounter (HOSPITAL_COMMUNITY): Payer: Self-pay | Admitting: Emergency Medicine

## 2017-05-19 ENCOUNTER — Emergency Department (HOSPITAL_COMMUNITY): Payer: Worker's Compensation

## 2017-05-19 ENCOUNTER — Other Ambulatory Visit: Payer: Self-pay

## 2017-05-19 DIAGNOSIS — Z7984 Long term (current) use of oral hypoglycemic drugs: Secondary | ICD-10-CM | POA: Insufficient documentation

## 2017-05-19 DIAGNOSIS — Y990 Civilian activity done for income or pay: Secondary | ICD-10-CM | POA: Insufficient documentation

## 2017-05-19 DIAGNOSIS — E119 Type 2 diabetes mellitus without complications: Secondary | ICD-10-CM | POA: Insufficient documentation

## 2017-05-19 DIAGNOSIS — M25531 Pain in right wrist: Secondary | ICD-10-CM | POA: Diagnosis not present

## 2017-05-19 DIAGNOSIS — Z79899 Other long term (current) drug therapy: Secondary | ICD-10-CM | POA: Diagnosis not present

## 2017-05-19 DIAGNOSIS — W108XXA Fall (on) (from) other stairs and steps, initial encounter: Secondary | ICD-10-CM | POA: Diagnosis not present

## 2017-05-19 DIAGNOSIS — M25561 Pain in right knee: Secondary | ICD-10-CM | POA: Diagnosis present

## 2017-05-19 MED ORDER — IBUPROFEN 400 MG PO TABS
600.0000 mg | ORAL_TABLET | Freq: Once | ORAL | Status: AC
  Administered 2017-05-19: 600 mg via ORAL
  Filled 2017-05-19: qty 1

## 2017-05-19 NOTE — Discharge Instructions (Signed)
You do not have any broken bones on your x-rays of the wrist and knee  Take ibuprofen, tylenol for pain. Ice and keep extremity elevated at rest  Return for worsening symptoms, including escalating pain, numbness/weakness, or any other symptoms concerning ot you

## 2017-05-19 NOTE — ED Notes (Signed)
Patient transported to X-ray 

## 2017-05-19 NOTE — ED Notes (Signed)
edp aware of bp  

## 2017-05-19 NOTE — ED Provider Notes (Signed)
[facility] EMERGENCY DEPARTMENT Provider Note   CSN: [id number] Arrival date & time: 05/19/17  4098     History   Chief Complaint Chief Complaint  Patient presents with  . Abrasion    right hand  . Knee Pain    HPI Danny Brewer is a 46 y.o. male.  The history is provided by the patient.  Knee Pain   This is a new problem. The current episode started less than 1 hour ago. The problem occurs constantly. The problem has not changed since onset.The pain is present in the right wrist and right knee. The quality of the pain is described as aching. The pain is severe. Associated symptoms include limited range of motion. Pertinent negatives include no numbness. The symptoms are aggravated by activity and standing. He has tried nothing for the symptoms. The treatment provided no relief. There has been a history of trauma.   46 year old male who presents with right wrist and right knee pain after fall. Reports going up the stairs at work, holding on the railing. Reports railing was loose and he fell forward on the right knee and right wrist. No head strike or LOC. Complains of right knee and right knee pain, worse with movement and ambulation, respectively. No alleviating factors. Tetanus received last year. Denies other injuries.  Past Medical History:  Diagnosis Date  . Complication of anesthesia    Pt verble and "prophecizes" after anesthesia  . Diabetes mellitus without complication (HCC)   . GERD (gastroesophageal reflux disease)   . Inguinal hernia 2016   right  . Kidney stones   . Type 1 diabetes (HCC)   . Vision abnormalities     Patient Active Problem List   Diagnosis Date Noted  . Abnormal MRI of head 03/31/2017  . Transient alteration of awareness 03/31/2017  . Snoring 03/31/2017  . Excessive daytime sleepiness 03/31/2017  . Back pain   . Paresthesias 08/26/2015  . Confusion 08/26/2015  . Sensory disturbance 08/26/2015  . Atypical chest pain  08/26/2015  . Dizziness and giddiness 06/06/2013  . Viral illness 06/06/2013  . GERD (gastroesophageal reflux disease) 06/06/2013    Past Surgical History:  Procedure Laterality Date  . HAND SURGERY Left 1987  . INGUINAL HERNIA REPAIR Bilateral 05/27/2015   Procedure: LAPAROSCOPIC BILATERAL  INGUINAL HERNIA REPAIRS WITH MESH;  Surgeon: Karie Soda, MD;  Location: [facility];  Service: General;  Laterality: Bilateral;  . KNEE SURGERY         Home Medications    Prior to Admission medications   Medication Sig Start Date End Date Taking? Authorizing Provider  atorvastatin (LIPITOR) 20 MG tablet Take 20 mg by mouth daily.    [provider]  lisinopril (PRINIVIL,ZESTRIL) 10 MG tablet Take 10 mg by mouth daily.    [provider]  metFORMIN (GLUCOPHAGE) 500 MG tablet Take 500 mg by mouth at bedtime.    [provider]  rizatriptan (MAXALT-MLT) 10 MG disintegrating tablet Take 1 tablet (10 mg total) by mouth as needed for migraine. May repeat in 2 hours if needed 03/31/17   Asa Lente, MD    Family History Family History  Problem Relation Age of Onset  . Hypertension Mother   . Cancer Father   . Heart failure Father   . Sudden death Brother   . Heart attack Brother   . Hypertension Sister   . Heart murmur Sister     Social History Social History   Tobacco  Use  . Smoking status: Never Smoker  . Smokeless tobacco: Never Used  Substance Use Topics  . Alcohol use: No  . Drug use: No     Allergies   Antihistamines, diphenhydramine-type; Demerol; and Morphine and related   Review of Systems Review of Systems  Constitutional: Negative for fever.  Cardiovascular: Negative for chest pain.  Musculoskeletal:       Right knee and wrist pain   Skin: Positive for wound.  Neurological: Negative for weakness and numbness.  Hematological: Does not bruise/bleed easily.  All other systems reviewed and are negative.    Physical Exam Updated  Vital Signs BP (!) 141/98   Pulse 88   Temp 98.7 F (37.1 C)   Resp 18   Ht 5\' 9"  (1.753 m)   Wt 83.5 kg (184 lb)   SpO2 100%   BMI 27.17 kg/m   Physical Exam Physical Exam  Nursing note and vitals reviewed. Constitutional: Well developed, well nourished, non-toxic, and in no acute distress Head: Normocephalic and atraumatic.  Mouth/Throat: Oropharynx is clear and moist.  Neck: Normal range of motion. Neck supple.  Cardiovascular: Normal rate and regular rhythm.   Pulmonary/Chest: Effort normal and breath sounds normal.  Abdominal: Soft. There is no tenderness. There is no rebound and no guarding.  Musculoskeletal: Normal range of motion of wrist and knee on right side w/o deformity, soft tissue swelling or bruising.  Neurological: Alert, no facial droop, fluent speech, moves all extremities symmetrically, full strength and sensation in bilateral upper and lower extremities Skin: Skin is warm and dry.  small abrasions to the dorsum of the right hand Psychiatric: Cooperative   ED Treatments / Results  Labs (all labs ordered are listed, but only abnormal results are displayed) Labs Reviewed - No data to display  EKG  EKG Interpretation None       Radiology Dg Wrist Complete Right  Result Date: 05/19/2017 CLINICAL DATA:  Larey SeatFell at work with wrist pain. EXAM: RIGHT WRIST - COMPLETE 3+ VIEW COMPARISON:  None. FINDINGS: No evidence of fracture or dislocation. There are chronic degenerative changes at the radial side of the carpus affecting the articulation between the navicular and multangular bones in the first carpometacarpal joint. IMPRESSION: No acute or traumatic finding. Radial side carpal degenerative changes. Electronically Signed   By: Paulina FusiMark  Shogry M.D.   On: 05/19/2017 07:40   Dg Knee Complete 4 Views Right  Result Date: 05/19/2017 CLINICAL DATA:  Larey SeatFell at work.  Generalized knee pain. EXAM: RIGHT KNEE - COMPLETE 4+ VIEW COMPARISON:  None. FINDINGS: No evidence of  fracture, dislocation, or joint effusion. No evidence of arthropathy or other focal bone abnormality. Soft tissues are unremarkable. IMPRESSION: Normal radiographs. Electronically Signed   By: Paulina FusiMark  Shogry M.D.   On: 05/19/2017 07:41    Procedures Procedures (including critical care time)  Medications Ordered in ED Medications  ibuprofen (ADVIL,MOTRIN) tablet 600 mg (600 mg Oral Given 05/19/17 0744)     Initial Impression / Assessment and Plan / ED Course  I have reviewed the triage vital signs and the nursing notes.  Pertinent labs & imaging results that were available during my care of the patient were reviewed by me and considered in my medical decision making (see chart for details).     Presents after mechanical fall w/ right wrist and knee pain. Well appearing. Normal ROM. Has been ambulatory. Doubt serious traumatic injury. Extremities NV in tact. X-rays visualized and shows no fractures.  No scaphoid tenderness to  suspect occult fracture.  Discussed supportive care management. Strict return and follow-up instructions reviewed. He expressed understanding of all discharge instructions and felt comfortable with the plan of care.   Final Clinical Impressions(s) / ED Diagnoses   Final diagnoses:  Acute pain of right knee  Right wrist pain    ED Discharge Orders    None       Lavera GuiseLiu, Kaye Mitro Duo, MD 05/19/17 [phone]

## 2017-05-19 NOTE — ED Triage Notes (Signed)
Reports going up the steps at work when the railing which was loose made him push forward onto right hand and right knee.  Reports all weight fell onto right wrist.  Small abrasions noted to right arm.

## 2018-03-22 ENCOUNTER — Emergency Department (HOSPITAL_BASED_OUTPATIENT_CLINIC_OR_DEPARTMENT_OTHER): Payer: BC Managed Care – PPO

## 2018-03-22 ENCOUNTER — Encounter (HOSPITAL_BASED_OUTPATIENT_CLINIC_OR_DEPARTMENT_OTHER): Payer: Self-pay | Admitting: Emergency Medicine

## 2018-03-22 ENCOUNTER — Emergency Department (HOSPITAL_BASED_OUTPATIENT_CLINIC_OR_DEPARTMENT_OTHER)
Admission: EM | Admit: 2018-03-22 | Discharge: 2018-03-23 | Disposition: A | Discharge status: Home / Self Care | Payer: BC Managed Care – PPO | Attending: Emergency Medicine | Admitting: Emergency Medicine

## 2018-03-22 ENCOUNTER — Other Ambulatory Visit: Payer: Self-pay

## 2018-03-22 DIAGNOSIS — R1011 Right upper quadrant pain: Secondary | ICD-10-CM | POA: Diagnosis not present

## 2018-03-22 DIAGNOSIS — E109 Type 1 diabetes mellitus without complications: Secondary | ICD-10-CM | POA: Diagnosis not present

## 2018-03-22 DIAGNOSIS — R1084 Generalized abdominal pain: Secondary | ICD-10-CM

## 2018-03-22 DIAGNOSIS — R109 Unspecified abdominal pain: Secondary | ICD-10-CM | POA: Diagnosis present

## 2018-03-22 DIAGNOSIS — Z79899 Other long term (current) drug therapy: Secondary | ICD-10-CM | POA: Diagnosis not present

## 2018-03-22 LAB — COMPREHENSIVE METABOLIC PANEL
ALT: 20 U/L (ref 0–44)
AST: 24 U/L (ref 15–41)
Albumin: 3.9 g/dL (ref 3.5–5.0)
Alkaline Phosphatase: 51 U/L (ref 38–126)
Anion gap: 7 (ref 5–15)
BUN: 10 mg/dL (ref 6–20)
CO2: 26 mmol/L (ref 22–32)
Calcium: 8.9 mg/dL (ref 8.9–10.3)
Chloride: 105 mmol/L (ref 98–111)
Creatinine, Ser: 1.06 mg/dL (ref 0.61–1.24)
GFR calc Af Amer: >60 mL/min (ref >60)
GFR calc non Af Amer: >60 mL/min (ref >60)
Glucose, Bld: 94 mg/dL (ref 70–99)
Potassium: 4 mmol/L (ref 3.5–5.1)
Sodium: 138 mmol/L (ref 135–145)
Total Bilirubin: 0.6 mg/dL (ref 0.3–1.2)
Total Protein: 7.1 g/dL (ref 6.5–8.1)

## 2018-03-22 LAB — URINALYSIS, ROUTINE W REFLEX MICROSCOPIC
Bilirubin Urine: NEGATIVE
Glucose, UA: NEGATIVE mg/dL
Hgb urine dipstick: NEGATIVE
Ketones, ur: NEGATIVE mg/dL
Leukocytes, UA: NEGATIVE
Nitrite: NEGATIVE
Protein, ur: NEGATIVE mg/dL
Specific Gravity, Urine: 1.025 (ref 1.005–1.030)
pH: 6.5 (ref 5.0–8.0)

## 2018-03-22 LAB — CBC
HCT: 43.9 % (ref 39.0–52.0)
Hemoglobin: 14 g/dL (ref 13.0–17.0)
MCH: 27.9 pg (ref 26.0–34.0)
MCHC: 31.9 g/dL (ref 30.0–36.0)
MCV: 87.5 fL (ref 80.0–100.0)
Platelets: 229 10*3/uL (ref 150–400)
RBC: 5.02 MIL/uL (ref 4.22–5.81)
RDW: 13.2 % (ref 11.5–15.5)
WBC: 7.1 10*3/uL (ref 4.0–10.5)
nRBC: 0 % (ref 0.0–0.2)

## 2018-03-22 LAB — LIPASE, BLOOD: Lipase: 20 U/L (ref 11–51)

## 2018-03-22 MED ORDER — ONDANSETRON HCL 4 MG/2ML IJ SOLN
4.0000 mg | Freq: Once | INTRAMUSCULAR | Status: AC
  Administered 2018-03-22: 4 mg via INTRAVENOUS
  Filled 2018-03-22: qty 2

## 2018-03-22 MED ORDER — IOPAMIDOL (ISOVUE-300) INJECTION 61%
100.0000 mL | Freq: Once | INTRAVENOUS | Status: AC | PRN
  Administered 2018-03-22: 100 mL via INTRAVENOUS

## 2018-03-22 MED ORDER — OXYCODONE HCL 5 MG PO TABS
5.0000 mg | ORAL_TABLET | Freq: Once | ORAL | Status: AC
  Administered 2018-03-22: 5 mg via ORAL
  Filled 2018-03-22: qty 1

## 2018-03-22 NOTE — ED Triage Notes (Signed)
Reports RUQ abdominal pain x 2-3 weeks.  C/o nausea. Denies vomiting and diarrhea.  Denies dysuria.

## 2018-03-22 NOTE — ED Notes (Signed)
ED Provider at bedside. 

## 2018-03-22 NOTE — ED Notes (Signed)
Patient transported to CT 

## 2018-03-22 NOTE — ED Notes (Signed)
Pt resting with eyes closed  Family at bedside  Waiting for CT scan

## 2018-03-22 NOTE — ED Provider Notes (Signed)
[facility] EMERGENCY DEPARTMENT Provider Note   CSN: [id number] Arrival date & time: 03/22/18  1856     History   Chief Complaint Chief Complaint  Patient presents with  . Abdominal Pain    HPI Danny Brewer is a 47 y.o. male.  The history is provided by the patient. No language interpreter was used.  Abdominal Pain     Danny Brewer is a 47 y.o. male who presents to the Emergency Department complaining of abdominal pain. He presents the emergency department complaining of abdominal pain for the last 2 to 3 weeks. He reports intermittent epigastric and right upper quadrant abdominal pain is been waxing and waning. Today his pain is constant with associated nausea and emesis once. He has decreased appetite with poor oral intake. No fevers, dysuria. He did have one loose stool today. He has a history of inguinal hernia repair, hypertension, diabetes. Past Medical History:  Diagnosis Date  . Complication of anesthesia    Pt verble and "prophecizes" after anesthesia  . Diabetes mellitus without complication (HCC)   . GERD (gastroesophageal reflux disease)   . Inguinal hernia 2016   right  . Kidney stones   . Type 1 diabetes (HCC)   . Vision abnormalities     Patient Active Problem List   Diagnosis Date Noted  . Abnormal MRI of head 03/31/2017  . Transient alteration of awareness 03/31/2017  . Snoring 03/31/2017  . Excessive daytime sleepiness 03/31/2017  . Back pain   . Paresthesias 08/26/2015  . Confusion 08/26/2015  . Sensory disturbance 08/26/2015  . Atypical chest pain 08/26/2015  . Dizziness and giddiness 06/06/2013  . Viral illness 06/06/2013  . GERD (gastroesophageal reflux disease) 06/06/2013    Past Surgical History:  Procedure Laterality Date  . HAND SURGERY Left 1987  . INGUINAL HERNIA REPAIR Bilateral 05/27/2015   Procedure: LAPAROSCOPIC BILATERAL  INGUINAL HERNIA REPAIRS WITH MESH;  Surgeon: Karie Soda, MD;  Location: [facility];  Service:  General;  Laterality: Bilateral;  . KNEE SURGERY          Home Medications    Prior to Admission medications   Medication Sig Start Date End Date Taking? Authorizing Provider  atorvastatin (LIPITOR) 20 MG tablet Take 20 mg by mouth daily.    [provider]  dicyclomine (BENTYL) 10 MG capsule Take 1 capsule (10 mg total) by mouth 3 (three) times daily with meals as needed for spasms. 03/23/18   Tilden Fossa, MD  lisinopril (PRINIVIL,ZESTRIL) 10 MG tablet Take 10 mg by mouth daily.    [provider]  metFORMIN (GLUCOPHAGE) 500 MG tablet Take 500 mg by mouth at bedtime.    [provider]  ondansetron (ZOFRAN ODT) 4 MG disintegrating tablet Take 1 tablet (4 mg total) by mouth every 8 (eight) hours as needed for nausea or vomiting. 03/23/18   Tilden Fossa, MD  rizatriptan (MAXALT-MLT) 10 MG disintegrating tablet Take 1 tablet (10 mg total) by mouth as needed for migraine. May repeat in 2 hours if needed 03/31/17   Asa Lente, MD    Family History Family History  Problem Relation Age of Onset  . Hypertension Mother   . Cancer Father   . Heart failure Father   . Sudden death Brother   . Heart attack Brother   . Hypertension Sister   . Heart murmur Sister     Social History Social History   Tobacco Use  . Smoking status: Never Smoker  . Smokeless tobacco: Never  Used  Substance Use Topics  . Alcohol use: No  . Drug use: No     Allergies   Antihistamines, diphenhydramine-type; Demerol; and Morphine and related   Review of Systems Review of Systems  Gastrointestinal: Positive for abdominal pain.  All other systems reviewed and are negative.    Physical Exam Updated Vital Signs BP 111/77 (BP Location: Right Arm)   Pulse 64   Temp 98.1 F (36.7 C) (Oral)   Resp 18   Ht 5\' 9"  (1.753 m)   Wt 89.8 kg   SpO2 96%   BMI 29.24 kg/m   Physical Exam  Constitutional: He is oriented to person, place, and time. He appears  well-developed and well-nourished.  HENT:  Head: Normocephalic and atraumatic.  Cardiovascular: Normal rate and regular rhythm.  No murmur heard. Pulmonary/Chest: Effort normal and breath sounds normal. No respiratory distress.  Abdominal: Soft. There is no rebound and no guarding.  Mild generalized tenderness, greatest in the RUQ  Musculoskeletal: He exhibits no edema or tenderness.  Neurological: He is alert and oriented to person, place, and time.  Skin: Skin is warm and dry.  Psychiatric: He has a normal mood and affect. His behavior is normal.  Nursing note and vitals reviewed.    ED Treatments / Results  Labs (all labs ordered are listed, but only abnormal results are displayed) Labs Reviewed  LIPASE, BLOOD  COMPREHENSIVE METABOLIC PANEL  CBC  URINALYSIS, ROUTINE W REFLEX MICROSCOPIC    EKG None  Radiology Ct Abdomen Pelvis W Contrast  Result Date: 03/23/2018 CLINICAL DATA:  47 y/o M; right upper quadrant abdominal pain for 3 weeks, nausea, no vomiting. EXAM: CT ABDOMEN AND PELVIS WITH CONTRAST TECHNIQUE: Multidetector CT imaging of the abdomen and pelvis was performed using the standard protocol following bolus administration of intravenous contrast. CONTRAST:  ISOVUE-300 IOPAMIDOL (ISOVUE-300) INJECTION 61% COMPARISON:  03/22/2018 abdominal ultrasound. 06/09/2016 CT abdomen and pelvis. FINDINGS: Lower chest: No acute abnormality. Hepatobiliary: No focal liver abnormality is seen. No gallstones, gallbladder wall thickening, or biliary dilatation. Pancreas: Unremarkable. No pancreatic ductal dilatation or surrounding inflammatory changes. Spleen: Normal in size without focal abnormality. Adrenals/Urinary Tract: Adrenal glands are unremarkable. Kidneys are normal, without renal calculi, focal lesion, or hydronephrosis. Bladder is unremarkable. Stomach/Bowel: Stomach is within normal limits. Appendix appears normal. No evidence of bowel wall thickening, distention, or  inflammatory changes. Vascular/Lymphatic: No significant vascular findings are present. No enlarged abdominal or pelvic lymph nodes. Reproductive: Normal. Bilateral inguinal hernia repair postsurgical changes, no recurrent hernia. Other: No abdominal wall hernia or abnormality. No abdominopelvic ascites. Musculoskeletal: No fracture is seen. Transitional S1 vertebral body. IMPRESSION: No acute process identified as explanation for pain. Unremarkable CT of abdomen and pelvis. Electronically Signed   By: Mitzi Hansen M.D.   On: 03/23/2018 00:15   US Abdomen Limited Ruq  Result Date: 03/22/2018 CLINICAL DATA:  Initial evaluation for acute right upper quadrant pain. EXAM: ULTRASOUND ABDOMEN LIMITED RIGHT UPPER QUADRANT COMPARISON:  Prior CT from 06/09/2016. FINDINGS: Gallbladder: No gallstones or wall thickening visualized. No sonographic Murphy sign noted by sonographer. Common bile duct: Diameter: 2 mm Liver: No focal lesion identified. Within normal limits in parenchymal echogenicity. Portal vein is patent on color Doppler imaging with normal direction of blood flow towards the liver. IMPRESSION: Normal right upper quadrant ultrasound. Electronically Signed   By: Rise Mu M.D.   On: 03/22/2018 21:50    Procedures Procedures (including critical care time)  Medications Ordered in ED Medications  ondansetron (  ZOFRAN) injection 4 mg (4 mg Intravenous Given 03/22/18 2056)  oxyCODONE (Oxy IR/ROXICODONE) immediate release tablet 5 mg (5 mg Oral Given 03/22/18 2205)  iopamidol (ISOVUE-300) 61 % injection 100 mL (100 mLs Intravenous Contrast Given 03/22/18 2326)     Initial Impression / Assessment and Plan / ED Course  I have reviewed the triage vital signs and the nursing notes.  Pertinent labs & imaging results that were available during my care of the patient were reviewed by me and considered in my medical decision making (see chart for details).     Pt here for evaluation  of right sided abdominal pain. His have tenderness on examination without peritoneal findings. Labs without any significant abnormalities. Read quadrant ultrasound negative for cholecystitis, cholelithiasis. CT abdomen obtained that is negative for acute appendicitis or diverticulitis. Presentation is not consistent with PE. Discussed with patient unclear source of his symptoms. Discussed home care, outpatient follow-up and return precautions.  Final Clinical Impressions(s) / ED Diagnoses   Final diagnoses:  RUQ abdominal pain  Generalized abdominal pain    ED Discharge Orders         Ordered    dicyclomine (BENTYL) 10 MG capsule  3 times daily with meals PRN     03/23/18 0008    ondansetron (ZOFRAN ODT) 4 MG disintegrating tablet  Every 8 hours PRN     03/23/18 0008           Tilden Fossa, MD 03/23/18 [phone]

## 2018-03-23 MED ORDER — ONDANSETRON 4 MG PO TBDP: 4.0000 mg | ORAL_TABLET | Freq: Three times a day (TID) | ORAL | 0 refills | Status: DC | PRN

## 2018-03-23 MED ORDER — DICYCLOMINE HCL 10 MG PO CAPS: 10.0000 mg | ORAL_CAPSULE | Freq: Three times a day (TID) | ORAL | 0 refills | Status: DC | PRN

## 2018-09-18 ENCOUNTER — Other Ambulatory Visit: Payer: Self-pay

## 2018-09-18 ENCOUNTER — Encounter (HOSPITAL_COMMUNITY): Payer: Self-pay | Admitting: Emergency Medicine

## 2018-09-18 ENCOUNTER — Emergency Department (HOSPITAL_COMMUNITY): Payer: BC Managed Care – PPO

## 2018-09-18 ENCOUNTER — Inpatient Hospital Stay (HOSPITAL_COMMUNITY)
Admission: EM | Admit: 2018-09-18 | Discharge: 2018-09-19 | DRG: 313 | Disposition: A | Discharge status: Home / Self Care | Payer: BC Managed Care – PPO | Attending: Internal Medicine | Admitting: Internal Medicine

## 2018-09-18 DIAGNOSIS — I10 Essential (primary) hypertension: Secondary | ICD-10-CM | POA: Diagnosis present

## 2018-09-18 DIAGNOSIS — Z8249 Family history of ischemic heart disease and other diseases of the circulatory system: Secondary | ICD-10-CM

## 2018-09-18 DIAGNOSIS — N400 Enlarged prostate without lower urinary tract symptoms: Secondary | ICD-10-CM | POA: Diagnosis present

## 2018-09-18 DIAGNOSIS — E785 Hyperlipidemia, unspecified: Secondary | ICD-10-CM | POA: Diagnosis present

## 2018-09-18 DIAGNOSIS — I4510 Unspecified right bundle-branch block: Secondary | ICD-10-CM | POA: Diagnosis present

## 2018-09-18 DIAGNOSIS — Z809 Family history of malignant neoplasm, unspecified: Secondary | ICD-10-CM | POA: Diagnosis not present

## 2018-09-18 DIAGNOSIS — Z888 Allergy status to other drugs, medicaments and biological substances status: Secondary | ICD-10-CM

## 2018-09-18 DIAGNOSIS — E119 Type 2 diabetes mellitus without complications: Secondary | ICD-10-CM | POA: Diagnosis present

## 2018-09-18 DIAGNOSIS — R079 Chest pain, unspecified: Secondary | ICD-10-CM | POA: Diagnosis present

## 2018-09-18 DIAGNOSIS — R0789 Other chest pain: Secondary | ICD-10-CM | POA: Diagnosis present

## 2018-09-18 DIAGNOSIS — Z7984 Long term (current) use of oral hypoglycemic drugs: Secondary | ICD-10-CM

## 2018-09-18 DIAGNOSIS — Z885 Allergy status to narcotic agent status: Secondary | ICD-10-CM

## 2018-09-18 DIAGNOSIS — Z833 Family history of diabetes mellitus: Secondary | ICD-10-CM

## 2018-09-18 DIAGNOSIS — K219 Gastro-esophageal reflux disease without esophagitis: Secondary | ICD-10-CM | POA: Diagnosis present

## 2018-09-18 DIAGNOSIS — R072 Precordial pain: Secondary | ICD-10-CM | POA: Diagnosis present

## 2018-09-18 LAB — CBC WITH DIFFERENTIAL/PLATELET
Abs Immature Granulocytes: 0.01 10*3/uL (ref 0.00–0.07)
Basophils Absolute: 0 10*3/uL (ref 0.0–0.1)
Basophils Relative: 1 %
Eosinophils Absolute: 0.2 10*3/uL (ref 0.0–0.5)
Eosinophils Relative: 3 %
HCT: 43.9 % (ref 39.0–52.0)
Hemoglobin: 14.1 g/dL (ref 13.0–17.0)
Immature Granulocytes: 0 %
Lymphocytes Relative: 35 %
Lymphs Abs: 2.2 10*3/uL (ref 0.7–4.0)
MCH: 27.7 pg (ref 26.0–34.0)
MCHC: 32.1 g/dL (ref 30.0–36.0)
MCV: 86.2 fL (ref 80.0–100.0)
Monocytes Absolute: 0.6 10*3/uL (ref 0.1–1.0)
Monocytes Relative: 10 %
Neutro Abs: 3.3 10*3/uL (ref 1.7–7.7)
Neutrophils Relative %: 51 %
Platelets: 238 10*3/uL (ref 150–400)
RBC: 5.09 MIL/uL (ref 4.22–5.81)
RDW: 12.9 % (ref 11.5–15.5)
WBC: 6.4 10*3/uL (ref 4.0–10.5)
nRBC: 0 % (ref 0.0–0.2)

## 2018-09-18 LAB — BASIC METABOLIC PANEL
Anion gap: 12 (ref 5–15)
BUN: 12 mg/dL (ref 6–20)
CO2: 21 mmol/L — ABNORMAL LOW (ref 22–32)
Calcium: 8.8 mg/dL — ABNORMAL LOW (ref 8.9–10.3)
Chloride: 105 mmol/L (ref 98–111)
Creatinine, Ser: 1.08 mg/dL (ref 0.61–1.24)
GFR calc Af Amer: >60 mL/min (ref >60)
GFR calc non Af Amer: >60 mL/min (ref >60)
Glucose, Bld: 115 mg/dL — ABNORMAL HIGH (ref 70–99)
Potassium: 4 mmol/L (ref 3.5–5.1)
Sodium: 138 mmol/L (ref 135–145)

## 2018-09-18 LAB — TROPONIN I
Troponin I: <0.03 ng/mL (ref <0.03)
Troponin I: <0.03 ng/mL

## 2018-09-18 LAB — MAGNESIUM: Magnesium: 2 mg/dL (ref 1.7–2.4)

## 2018-09-18 LAB — TSH: TSH: 3.9 u[IU]/mL (ref 0.350–4.500)

## 2018-09-18 LAB — PROTIME-INR
INR: 1 (ref 0.8–1.2)
Prothrombin Time: 12.7 seconds (ref 11.4–15.2)

## 2018-09-18 MED ORDER — NITROGLYCERIN 0.4 MG SL SUBL: 0.4000 mg | SUBLINGUAL_TABLET | SUBLINGUAL | Status: DC | PRN

## 2018-09-18 MED ORDER — SODIUM CHLORIDE 0.9% FLUSH: 3.0000 mL | INTRAVENOUS | Status: DC | PRN

## 2018-09-18 MED ORDER — ACETAMINOPHEN 325 MG PO TABS: 650.0000 mg | ORAL_TABLET | ORAL | Status: DC | PRN

## 2018-09-18 MED ORDER — ASPIRIN 300 MG RE SUPP: 300.0000 mg | RECTAL | Status: AC

## 2018-09-18 MED ORDER — ASPIRIN 81 MG PO CHEW
324.0000 mg | CHEWABLE_TABLET | ORAL | Status: AC
  Administered 2018-09-18: 324 mg via ORAL
  Filled 2018-09-18: qty 4

## 2018-09-18 MED ORDER — ONDANSETRON HCL 4 MG/2ML IJ SOLN: 4.0000 mg | Freq: Four times a day (QID) | INTRAMUSCULAR | Status: DC | PRN

## 2018-09-18 MED ORDER — ATORVASTATIN CALCIUM 40 MG PO TABS: 40.0000 mg | ORAL_TABLET | Freq: Every day | ORAL | Status: DC

## 2018-09-18 MED ORDER — ENOXAPARIN SODIUM 40 MG/0.4ML ~~LOC~~ SOLN: 40.0000 mg | SUBCUTANEOUS | Status: DC

## 2018-09-18 MED ORDER — TAMSULOSIN HCL 0.4 MG PO CAPS
0.4000 mg | ORAL_CAPSULE | Freq: Every day | ORAL | Status: DC
  Administered 2018-09-19: 0.4 mg via ORAL
  Filled 2018-09-18: qty 1

## 2018-09-18 MED ORDER — CARVEDILOL 6.25 MG PO TABS: 6.2500 mg | ORAL_TABLET | Freq: Two times a day (BID) | ORAL | Status: DC

## 2018-09-18 MED ORDER — SODIUM CHLORIDE 0.9% FLUSH
3.0000 mL | Freq: Two times a day (BID) | INTRAVENOUS | Status: DC
  Administered 2018-09-19 (×2): 3 mL via INTRAVENOUS

## 2018-09-18 MED ORDER — ASPIRIN EC 81 MG PO TBEC
81.0000 mg | DELAYED_RELEASE_TABLET | Freq: Every day | ORAL | Status: DC
  Administered 2018-09-19: 81 mg via ORAL
  Filled 2018-09-18: qty 1

## 2018-09-18 MED ORDER — SODIUM CHLORIDE 0.9 % IV SOLN: 250.0000 mL | INTRAVENOUS | Status: DC | PRN

## 2018-09-18 MED ORDER — LISINOPRIL 10 MG PO TABS
10.0000 mg | ORAL_TABLET | Freq: Every day | ORAL | Status: DC
  Administered 2018-09-19: 10 mg via ORAL
  Filled 2018-09-18: qty 1

## 2018-09-18 MED ORDER — REGADENOSON 0.4 MG/5ML IV SOLN
0.4000 mg | Freq: Once | INTRAVENOUS | Status: DC
  Filled 2018-09-18: qty 5

## 2018-09-18 NOTE — H&P (Addendum)
History and Physical    Danny Brewer ZOX:[id number] DOB: Apr 28, 1971 DOA: 09/18/2018  PCP: Ileana Ladd, MD   Patient coming from: Home I have personally briefly reviewed patient's old medical records in [facility] link   Chief Complaint: Chest pain  HPI: Danny Brewer is a 48 y.o. male with medical history significant of diabetes mellitus type 2, hypertension, gastroesophageal reflux disease, benign protatic hypertrophy and positive family history of sudden death in brother at age 53 and another brother with MI at early age (Did not recall age).  He presented to the ED with acute onset of left-sided retrosternal chest pain radiating to left arm and shoulder.  This started after patient was celebrating his daughter's birthday at about 5 PM today.  He described the pain as tight gripping left side of his chest and 7-8/10 in severity.  He did not recall any relieving factor but did admit that motion aggravates pain.  He denies any shortness of breath, nausea, vomiting or diaphoresis but did have an episode of dizziness coupled with loose  Stool.  Patient endorsed similar chest pain about many years ago (2007/2008) for which he was worked up with stress test with no significant abnormal findings.  Patient has not followed any cardiology in the past. He is scheduled for annual physical with his primary care physician (Dr Modesto Charon) on September 20, 2018.  Patient endorsed significant family history with his brother dying at age 36 suddenly and a brother with heart attack at an early age. At time of exam, patient noted pain to be 4-5/10. Pain is reproducible with palpation. HEART score about 5 (12-16% MACE).   ED Course: In the ED patient had a dose of aspirin to 324 mg. Initial troponin was negative. Trending troponin. ECG showed sinus rhythm at 85/min with non specific repolarization abnormality (RSR in V1 and V2). No acute ST-T wave changes. I personally reviewed the ECG..   Review of Systems: Left  retrosternal chest pain All other systems were reviewed and are negative except as documented in HPI   Past Medical History:  Diagnosis Date  . Complication of anesthesia    Pt verble and "prophecizes" after anesthesia  . Diabetes mellitus without complication (HCC)   . GERD (gastroesophageal reflux disease)   . Inguinal hernia 2016   right  . Kidney stones   . Type 1 diabetes (HCC)   . Vision abnormalities     Past Surgical History:  Procedure Laterality Date  . HAND SURGERY Left 1987  . INGUINAL HERNIA REPAIR Bilateral 05/27/2015   Procedure: LAPAROSCOPIC BILATERAL  INGUINAL HERNIA REPAIRS WITH MESH;  Surgeon: Karie Soda, MD;  Location: [facility];  Service: General;  Laterality: Bilateral;  . KNEE SURGERY       reports that he has never smoked. He has never used smokeless tobacco. He reports that he does not drink alcohol or use drugs.  Allergies  Allergen Reactions  . Antihistamines, Diphenhydramine-Type Hives and Other (See Comments)    Seizures  . Demerol Hives  . Morphine And Related Hives    Esp with high dose morphine, possibly Dilaudid Tolerates hydrocodone, oxycodone    Family History  Problem Relation Age of Onset  . Hypertension Mother   . Cancer Father   . Heart failure Father   . Sudden death Brother   . Heart attack Brother   . Hypertension Sister   . Heart murmur Sister   Family history Mother - Hypertension Father-cancer Brother-sudden death age 44 Brother-heart attack Sister-hypertension  Sister  Heart murmur  Prior to Admission medications   Medication Sig Start Date End Date Taking? Authorizing Provider  lisinopril (PRINIVIL,ZESTRIL) 10 MG tablet Take 10 mg by mouth daily.   Yes [provider]  sitaGLIPtin (JANUVIA) 100 MG tablet Take 100 mg by mouth at bedtime.    Yes [provider]  tamsulosin (FLOMAX) 0.4 MG CAPS capsule Take 0.4 mg by mouth at bedtime.    Yes [provider]    Physical Exam: Vitals:    09/18/18 1830 09/18/18 1900 09/18/18 1930 09/18/18 2030  BP: 120/71 (!) 143/94 130/90 (!) 134/91  Pulse: 90 89 82 81  Resp: (!) Temp:      TempSrc:      SpO2: 99% 95% 99% 100%  Weight:      Height:        Constitutional: NAD, calm, comfortable Vitals:   09/18/18 1830 09/18/18 1900 09/18/18 1930 09/18/18 2030  BP: 120/71 (!) 143/94 130/90 (!) 134/91  Pulse: 90 89 82 81  Resp: (!) Temp:      TempSrc:      SpO2: 99% 95% 99% 100%  Weight:      Height:       Eyes: PERRL, lids and conjunctivae normal ENMT: Mucous membranes are moist. Posterior pharynx clear of any exudate or lesions.Normal dentition.  Neck: normal, supple, no masses, no thyromegaly Respiratory: clear to auscultation bilaterally, no wheezing, no crackles. Normal respiratory effort. No accessory muscle use.  Cardiovascular: Regular rate and rhythm, no murmurs / rubs / gallops. No extremity edema. 2+ pedal pulses. No carotid bruits.  Abdomen: no tenderness, no masses palpated. No hepatosplenomegaly. Bowel sounds positive.  Musculoskeletal: no clubbing / cyanosis. No joint deformity upper and lower extremities. Good ROM, no contractures. Normal muscle tone.  Reproducible chest pain on palpation Skin: no rashes, lesions, ulcers. No induration Neurologic: CN 2-12 grossly intact. Sensation intact, DTR normal. Strength 5/5 in all 4.  Psychiatric: Normal judgment and insight. Alert and oriented x 3. Normal mood.    Labs on Admission: I have personally reviewed following labs and imaging studies  CBC: Recent Labs  Lab 09/18/18 1751  WBC 6.4  NEUTROABS 3.3  HGB 14.1  HCT 43.9  MCV 86.2  PLT 238   Basic Metabolic Panel: Recent Labs  Lab 09/18/18 1751  NA 138  K 4.0  CL 105  CO2 21*  GLUCOSE 115*  BUN 12  CREATININE 1.08  CALCIUM 8.8*   GFR: Estimated Creatinine Clearance: 91 mL/min (by C-G formula based on SCr of 1.08 mg/dL). Liver Function Tests: No results for input(s): AST,  ALT, ALKPHOS, BILITOT, PROT, ALBUMIN in the last 168 hours. No results for input(s): LIPASE, AMYLASE in the last 168 hours. No results for input(s): AMMONIA in the last 168 hours. Coagulation Profile: No results for input(s): INR, PROTIME in the last 168 hours. Cardiac Enzymes: Recent Labs  Lab 09/18/18 1751  TROPONINI <0.03   BNP (last 3 results) No results for input(s): PROBNP in the last 8760 hours. HbA1C: No results for input(s): HGBA1C in the last 72 hours. CBG: No results for input(s): GLUCAP in the last 168 hours. Lipid Profile: No results for input(s): CHOL, HDL, LDLCALC, TRIG, CHOLHDL, LDLDIRECT in the last 72 hours. Thyroid Function Tests: No results for input(s): TSH, T4TOTAL, FREET4, T3FREE, THYROIDAB in the last 72 hours. Anemia Panel: No results for input(s): VITAMINB12, FOLATE, FERRITIN, TIBC, IRON, RETICCTPCT in the last 72  hours. Urine analysis:    Component Value Date/Time   COLORURINE YELLOW 03/22/2018 1936   APPEARANCEUR CLEAR 03/22/2018 1936   LABSPEC 1.025 03/22/2018 1936   PHURINE 6.5 03/22/2018 1936   GLUCOSEU NEGATIVE 03/22/2018 1936   HGBUR NEGATIVE 03/22/2018 1936   BILIRUBINUR NEGATIVE 03/22/2018 1936   KETONESUR NEGATIVE 03/22/2018 1936   PROTEINUR NEGATIVE 03/22/2018 1936   UROBILINOGEN 0.2 11/26/2014 1518   NITRITE NEGATIVE 03/22/2018 1936   LEUKOCYTESUR NEGATIVE 03/22/2018 1936    Radiological Exams on Admission: Dg Chest 2 View  Result Date: 09/18/2018 CLINICAL DATA:  Chest pain, dizziness. EXAM: CHEST - 2 VIEW COMPARISON:  Chest x-ray dated 08/12/2017. FINDINGS: Cardiomediastinal silhouette is within normal limits in size and configuration. Lungs are clear. Lung volumes are normal. No evidence of pneumonia. No pleural effusion. No pneumothorax seen. Osseous structures about the chest are unremarkable. IMPRESSION: No active cardiopulmonary disease. No evidence of pneumonia or pulmonary edema. Electronically Signed   By: Bary RichardStan  Maynard M.D.    On: 09/18/2018 18:08    EKG: Independently reviewed. ECG showed sinus rhythm at 85/min with non specific repolarization abnormality (RSR in V1 and V2). No acute ST-T wave changes.  Assessment/Plan Active Problems:   Chest pain with high risk of acute coronary syndrome 48 year old male with extensive family history of cardiac disease with brother dying at age 50 suddenly and another brother with MI at early age, history of diabetes and hypertension.  Currently on Januvia, and lisinopril presenting with acute onset of atypical left retrosternal chest pain radiating to left shoulders and left arm.  Initial troponin was less than 0.03, EKG showed nonspecific repolarization in leads V1 and V2, patient has similar chest pain about 2007/2008 and  had a stress test with no abnormal finding.HEART score is 5 with 12-16% MACE. Plan of care Initiated chest pain protocol (aspirin,statin and carvedilol).. Patient already taking lisinopril 10 mg daily for blood pressure Nitroglycerin prn Pain control Serial ECG and troponin Lipid panel Please consult cardiology in AM to evaluate if patient will need stress test. Patient is on n.p.o after midnight.(Resart diet if cardio is does not plan for stress.) Obtain 2D Echo complete..  Hypertension.  Blood pressure has been fairly controlled on lisinopril Plan of care I will resume his home lisinopril 10 mg daily   Diabetes mellitus type 2 controlled.  Serum blood glucose at presentation was 115.  No recent hemoglobin A1c on file Plan I will order hemoglobin A1c Continue with fingersticks twice daily Hold Januvia and cover with sliding scale insulin twice daily Hypoglycemic protocol    Gastroesophageal reflux disease.  Currently asymptomatic Continue with Protonix 40 mg daily.  Benign prostatic hypertrophy.  Patient has been doing well on Flomax 0.4 mg daily Plan Resume Flomax at home dose of 0.4 mg daily   Diet: NPO after midnight. (Resume diet if no  cardio further study)  DVT prophylaxis: Subcu Lovenox 40 mg daily   Code Status: Full code  Family Communication: I spoke with patient's wife Mikel CellaJenny Lindsay at [phone] to inform her of the plan of admission for work-up of her husband. Disposition Plan: Discharge home on 09/20/2018/pending cardiology work up  Consults called: Cardiology consult in the morning. Only fellow covering. Please note that patient is n.p.o on the premise that cardiology may want to stress patient. Follow up cardiology consult early and consider restarting diet if no plan for stress by cardio.  Admission status: Inpatient floor with telemetry.   Aquilla HackerFelix O Jailon Schaible MD [facility] Pager [phone]  If 7PM-7AM, please contact night-coverage [email or web address] Password [facility]  09/18/2018, 9:15 PM

## 2018-09-18 NOTE — ED Notes (Signed)
Patient transported to X-ray 

## 2018-09-18 NOTE — ED Provider Notes (Signed)
[facility] Provider Note   CSN: [id number] Arrival date & time: 09/18/18  1722    History   Chief Complaint Chief Complaint  Patient presents with  . Chest Pain  . Dizziness    HPI Danny BoerRalph Brewer is a 48 y.o. male.     HPI  patient presents with chest pain.  States he was eating dinner began to have pain that went from his left chest to his left arm.  Was dull.  States he felt his heart going fast became dizzy.  Pain is improved but still present.  States he had been doing some painting his weight had some neck pain recently.  Pain is worse when he moves or presses on the chest.  States he had some pain like this years ago and was worked up and no cause was found.  No fevers.  No cough.  No shortness of breath.  No swelling in his legs.  States he has high blood pressure and diabetes but both are improving and there going to try and get him off some the medicines.  States he has had a previous negative stress test couple years ago.  States he did have a brother who died of an MI in his 4230s. Past Medical History:  Diagnosis Date  . Complication of anesthesia    Pt verble and "prophecizes" after anesthesia  . Diabetes mellitus without complication (HCC)   . GERD (gastroesophageal reflux disease)   . Inguinal hernia 2016   right  . Kidney stones   . Type 1 diabetes (HCC)   . Vision abnormalities     Patient Active Problem List   Diagnosis Date Noted  . Abnormal MRI of head 03/31/2017  . Transient alteration of awareness 03/31/2017  . Snoring 03/31/2017  . Excessive daytime sleepiness 03/31/2017  . Back pain   . Paresthesias 08/26/2015  . Confusion 08/26/2015  . Sensory disturbance 08/26/2015  . Atypical chest pain 08/26/2015  . Dizziness and giddiness 06/06/2013  . Viral illness 06/06/2013  . GERD (gastroesophageal reflux disease) 06/06/2013    Past Surgical History:  Procedure Laterality Date  . HAND SURGERY Left 1987  .  INGUINAL HERNIA REPAIR Bilateral 05/27/2015   Procedure: LAPAROSCOPIC BILATERAL  INGUINAL HERNIA REPAIRS WITH MESH;  Surgeon: Karie SodaSteven Gross, MD;  Location: [facility];  Service: General;  Laterality: Bilateral;  . KNEE SURGERY          Home Medications    Prior to Admission medications   Medication Sig Start Date End Date Taking? Authorizing Provider  lisinopril (PRINIVIL,ZESTRIL) 10 MG tablet Take 10 mg by mouth daily.   Yes [provider]  sitaGLIPtin (JANUVIA) 100 MG tablet Take 100 mg by mouth at bedtime.    Yes [provider]  tamsulosin (FLOMAX) 0.4 MG CAPS capsule Take 0.4 mg by mouth at bedtime.    Yes [provider]    Family History Family History  Problem Relation Age of Onset  . Hypertension Mother   . Cancer Father   . Heart failure Father   . Sudden death Brother   . Heart attack Brother   . Hypertension Sister   . Heart murmur Sister     Social History Social History   Tobacco Use  . Smoking status: Never Smoker  . Smokeless tobacco: Never Used  Substance Use Topics  . Alcohol use: No  . Drug use: No     Allergies   Antihistamines, diphenhydramine-type; Demerol; and Morphine and  related   Review of Systems Review of Systems  Constitutional: Negative for appetite change.  HENT: Negative for congestion.   Cardiovascular: Positive for chest pain.  Gastrointestinal: Negative for abdominal pain.  Genitourinary: Negative for flank pain.  Musculoskeletal: Positive for neck pain. Negative for back pain.  Neurological: Negative for weakness.  Psychiatric/Behavioral: Negative for agitation and confusion.     Physical Exam Updated Vital Signs BP 130/90   Pulse 82   Temp 97.9 F (36.6 C) (Oral)   Resp 17   Ht  (1.753 m)   Wt 88.5 kg   SpO2 99%   BMI 28.80 kg/m   Physical Exam Vitals signs and nursing note reviewed.  Constitutional:      Appearance: He is well-developed.  HENT:     Head: Normocephalic.   Cardiovascular:     Rate and Rhythm: Normal rate and regular rhythm.  Pulmonary:     Breath sounds: No rhonchi or rales.     Comments: Tenderness to left lateral chest wall.  This does reproduce the pain. Musculoskeletal:     Right lower leg: He exhibits no tenderness. No edema.     Left lower leg: He exhibits no tenderness. No edema.  Skin:    General: Skin is warm.     Capillary Refill: Capillary refill takes less than 2 seconds.  Neurological:     General: No focal deficit present.      ED Treatments / Results  Labs (all labs ordered are listed, but only abnormal results are displayed) Labs Reviewed  BASIC METABOLIC PANEL - Abnormal; Notable for the following components:      Result Value   CO2 21 (*)    Glucose, Bld 115 (*)    Calcium 8.8 (*)    All other components within normal limits  CBC WITH DIFFERENTIAL/PLATELET  TROPONIN I    EKG EKG Interpretation  Date/Time:  Sunday September 18 2018 17:30:28 EDT Ventricular Rate:  80 PR Interval:    QRS Duration: 96 QT Interval:  356 QTC Calculation: 411 R Axis:   71 Text Interpretation:  Sinus rhythm RSR' in V1 or V2, right VCD or RVH ST elev, probable normal early repol pattern Confirmed by Benjiman Core [phone]) on 09/18/2018 5:38:13 PM   EKG Interpretation  Date/Time:  Sunday September 18 2018 19:46:08 EDT Ventricular Rate:  85 PR Interval:    QRS Duration: 97 QT Interval:  358 QTC Calculation: 426 R Axis:   75 Text Interpretation:  Sinus rhythm RSR' in V1 or V2, right VCD or RVH ST elevation, consider inferior injury Confirmed by Benjiman Core [phone]) on 09/18/2018 8:01:05 PM       Radiology Dg Chest 2 View  Result Date: 09/18/2018 CLINICAL DATA:  Chest pain, dizziness. EXAM: CHEST - 2 VIEW COMPARISON:  Chest x-ray dated 08/12/2017. FINDINGS: Cardiomediastinal silhouette is within normal limits in size and configuration. Lungs are clear. Lung volumes are normal. No evidence of pneumonia. No pleural  effusion. No pneumothorax seen. Osseous structures about the chest are unremarkable. IMPRESSION: No active cardiopulmonary disease. No evidence of pneumonia or pulmonary edema. Electronically Signed   By: Bary Richard M.D.   On: 09/18/2018 18:08    Procedures Procedures (including critical care time)  Medications Ordered in ED Medications - No data to display   Initial Impression / Assessment and Plan / ED Course  I have reviewed the triage vital signs and the nursing notes.  Pertinent labs & imaging results that were available during my  care of the patient were reviewed by me and considered in my medical decision making (see chart for details).        Patient with left-sided chest pain.  Began acutely.  Had dizziness and may have felt his heart racing with the episode.  Continues to have some pain.  Heart score is 5.  EKG shows some nonspecific changes with some potential up coving of the ST segment on the initial tracing in V3.  However the chest pain is reproducible.  However the high heart score would suggest admission to the hospital.  Will discuss with hospitalist  Final Clinical Impressions(s) / ED Diagnoses   Final diagnoses:  Chest pain, unspecified type    ED Discharge Orders    None       Benjiman Core, MD 09/18/18 2010

## 2018-09-18 NOTE — ED Notes (Signed)
ED TO INPATIENT HANDOFF REPORT  ED Nurse Name and Phone #: Romeo AppleBen [phone]  S Name/Age/Gender Danny Brewer 48 y.o. male Room/Bed: 017C/017C  Code Status   Code Status: Full Code  Home/SNF/Other Home Patient oriented to: self, place, time and situation Is this baseline? Yes   Triage Complete: Triage complete  Chief Complaint chest pain  Triage Note Pt arrived POV with complaints on chest pain that began 30 mins to 1 hour ago and radiates to left arm. Pt states he was at home when he began to feel dizzy with chest pain 7/10.  Pt is alert and oriented X4   Allergies Allergies  Allergen Reactions  . Antihistamines, Diphenhydramine-Type Hives and Other (See Comments)    Seizures  . Demerol Hives  . Morphine And Related Hives    Esp with high dose morphine, possibly Dilaudid Tolerates hydrocodone, oxycodone    Level of Care/Admitting Diagnosis ED Disposition    ED Disposition Condition Comment   Admit  [facility][id number]  Level of Care: Telemetry Medical [104]  Diagnosis: Chest pain with high risk of acute coronary syndrome [[id number]][[id number]]  Admitting Physician: Aquilla HackerAJUONUMA, FELIX O [[id number]]  Attending Physician: Aquilla HackerJUONUMA, FELIX O [[id number]]  Estimated length of stay: past midnight tomorrow  Certification:: I certify this patient will need inpatient services for at least 2 midnights  Bed request comments: Medical with telemetry  PT Class (Do Not Modify): Inpatient [101]  PT Acc Code (Do Not Modify): Private [1]       B Medical/Surgery History Past Medical History:  Diagnosis Date  . Complication of anesthesia    Pt verble and "prophecizes" after anesthesia  . Diabetes mellitus without complication (HCC)   . GERD (gastroesophageal reflux disease)   . Inguinal hernia 2016   right  . Kidney stones   . Type 1 diabetes (HCC)   . Vision abnormalities    Past Surgical History:  Procedure Laterality Date  . HAND SURGERY Left 1987  .  INGUINAL HERNIA REPAIR Bilateral 05/27/2015   Procedure: LAPAROSCOPIC BILATERAL  INGUINAL HERNIA REPAIRS WITH MESH;  Surgeon: Karie SodaSteven Gross, MD;  Location: [facility];  Service: General;  Laterality: Bilateral;  . KNEE SURGERY       A IV Location/Drains/Wounds Patient Lines/Drains/Airways Status   Active Line/Drains/Airways    Name:   Placement date:   Placement time:   Site:   Days:   Peripheral IV 09/18/18 Right Antecubital   09/18/18    1743    Antecubital   less than 1   Incision (Closed) 05/27/15 Abdomen   05/27/15    0911     1210   Incision - 3 Ports Abdomen Right;Medial Umbilicus Left;Medial   05/27/15    0800     1210          Intake/Output Last 24 hours No intake or output data in the 24 hours ending 09/18/18 2100  Labs/Imaging Results for orders placed or performed during the hospital encounter of 09/18/18 (from the past 48 hour(s))  CBC with Differential     Status: None   Collection Time: 09/18/18  5:51 PM  Result Value Ref Range   WBC 6.4 4.0 - 10.5 K/uL   RBC 5.09 4.22 - 5.81 MIL/uL   Hemoglobin 14.1 13.0 - 17.0 g/dL   HCT 96.243.9 95.239.0 - 84.152.0 %   MCV 86.2 80.0 - 100.0 fL   MCH 27.7 26.0 - 34.0 pg   MCHC 32.1 30.0 - 36.0 g/dL  RDW 12.9 11.5 - 15.5 %   Platelets 238 150 - 400 K/uL   nRBC 0.0 0.0 - 0.2 %   Neutrophils Relative % 51 %   Neutro Abs 3.3 1.7 - 7.7 K/uL   Lymphocytes Relative 35 %   Lymphs Abs 2.2 0.7 - 4.0 K/uL   Monocytes Relative 10 %   Monocytes Absolute 0.6 0.1 - 1.0 K/uL   Eosinophils Relative 3 %   Eosinophils Absolute 0.2 0.0 - 0.5 K/uL   Basophils Relative 1 %   Basophils Absolute 0.0 0.0 - 0.1 K/uL   Immature Granulocytes 0 %   Abs Immature Granulocytes 0.01 0.00 - 0.07 K/uL    Comment: Performed at [facility], [address] [place], [place] [address]  Troponin I - Once     Status: None   Collection Time: 09/18/18  5:51 PM  Result Value Ref Range   Troponin I <0.03 <0.03 ng/mL    Comment: Performed at [facility],  [address] [place], [place] [address]  Basic metabolic panel     Status: Abnormal   Collection Time: 09/18/18  5:51 PM  Result Value Ref Range   Sodium 138 135 - 145 mmol/L   Potassium 4.0 3.5 - 5.1 mmol/L   Chloride 105 98 - 111 mmol/L   CO2 21 (L) 22 - 32 mmol/L   Glucose, Bld 115 (H) 70 - 99 mg/dL   BUN 12 6 - 20 mg/dL   Creatinine, Ser 1.41 0.61 - 1.24 mg/dL   Calcium 8.8 (L) 8.9 - 10.3 mg/dL   GFR calc non Af Amer >60 >60 mL/min   GFR calc Af Amer >60 >60 mL/min   Anion gap 12 5 - 15    Comment: Performed at [facility], [address], [place], [place] [address]   Dg Chest 2 View  Result Date: 09/18/2018 CLINICAL DATA:  Chest pain, dizziness. EXAM: CHEST - 2 VIEW COMPARISON:  Chest x-ray dated 08/12/2017. FINDINGS: Cardiomediastinal silhouette is within normal limits in size and configuration. Lungs are clear. Lung volumes are normal. No evidence of pneumonia. No pleural effusion. No pneumothorax seen. Osseous structures about the chest are unremarkable. IMPRESSION: No active cardiopulmonary disease. No evidence of pneumonia or pulmonary edema. Electronically Signed   By: Bary Richard M.D.   On: 09/18/2018 18:08    Pending Labs Unresulted Labs (From admission, onward)    Start     Ordered   09/25/18 0500  Creatinine, serum  (enoxaparin (LOVENOX)    CrCl >/= 30 ml/min)  Weekly,   R    Comments:  while on enoxaparin therapy    09/18/18 2053   09/19/18 0500  Lipid panel  Tomorrow morning,   R     09/18/18 2053   09/18/18 2050  Troponin I - Now Then Q6H  Now then every 6 hours,   STAT     09/18/18 2053   09/18/18 2050  Magnesium  Once,   R     09/18/18 2053   09/18/18 2050  Protime-INR  ONCE - STAT,   STAT     09/18/18 2053   09/18/18 2045  HIV antibody (Routine Testing)  Once,   R     09/18/18 2053          Vitals/Pain Today's Vitals   09/18/18 1830 09/18/18 1900 09/18/18 1930 09/18/18 2030  BP: 120/71 (!) 143/94 130/90 (!) 134/91  Pulse: 90 89 82 81  Resp:  (!)  Temp:      TempSrc:      SpO2: 99% 95% 99% 100%  Weight:      Height:      PainSc:        Isolation Precautions No active isolations  Medications Medications  aspirin chewable tablet 324 mg (has no administration in time range)    Or  aspirin suppository 300 mg (has no administration in time range)  aspirin EC tablet 81 mg (has no administration in time range)  nitroGLYCERIN (NITROSTAT) SL tablet 0.4 mg (has no administration in time range)  acetaminophen (TYLENOL) tablet 650 mg (has no administration in time range)  ondansetron (ZOFRAN) injection 4 mg (has no administration in time range)  enoxaparin (LOVENOX) injection 40 mg (has no administration in time range)  carvedilol (COREG) tablet 6.25 mg (has no administration in time range)  atorvastatin (LIPITOR) tablet 40 mg (has no administration in time range)    Mobility walks Low fall risk   Focused Assessments Cardiac Assessment Handoff:  Cardiac Rhythm: Normal sinus rhythm Lab Results  Component Value Date   CKTOTAL 341 (H) 02/23/2010   CKMB 2.4 02/23/2010   TROPONINI <0.03 09/18/2018   Lab Results  Component Value Date   DDIMER <0.27 08/27/2015   Does the Patient currently have chest pain? No     R Recommendations: See Admitting Provider Note  Report given to:   Additional Notes:  N/A

## 2018-09-18 NOTE — ED Triage Notes (Signed)
Pt arrived POV with complaints on chest pain that began 30 mins to 1 hour ago and radiates to left arm. Pt states he was at home when he began to feel dizzy with chest pain 7/10.  Pt is alert and oriented X4

## 2018-09-19 ENCOUNTER — Inpatient Hospital Stay (HOSPITAL_COMMUNITY): Payer: BC Managed Care – PPO

## 2018-09-19 ENCOUNTER — Other Ambulatory Visit: Payer: Self-pay | Admitting: Student

## 2018-09-19 DIAGNOSIS — R079 Chest pain, unspecified: Secondary | ICD-10-CM

## 2018-09-19 LAB — GLUCOSE, CAPILLARY
Glucose-Capillary: 104 mg/dL — ABNORMAL HIGH (ref 70–99)
Glucose-Capillary: 136 mg/dL — ABNORMAL HIGH (ref 70–99)
Glucose-Capillary: 85 mg/dL (ref 70–99)

## 2018-09-19 LAB — LIPID PANEL
Cholesterol: 198 mg/dL (ref 0–200)
HDL: 46 mg/dL (ref >40)
LDL Cholesterol: 139 mg/dL — ABNORMAL HIGH (ref 0–99)
Total CHOL/HDL Ratio: 4.3 RATIO
Triglycerides: 67 mg/dL (ref <150)
VLDL: 13 mg/dL (ref 0–40)

## 2018-09-19 LAB — ECHOCARDIOGRAM LIMITED
Height: 69 in
Weight: 3225.6 oz

## 2018-09-19 LAB — HEMOGLOBIN A1C
Hgb A1c MFr Bld: 6 % — ABNORMAL HIGH (ref 4.8–5.6)
Mean Plasma Glucose: 126 mg/dL

## 2018-09-19 LAB — TROPONIN I
Troponin I: <0.03 ng/mL (ref <0.03)
Troponin I: <0.03 ng/mL (ref <0.03)

## 2018-09-19 LAB — HIV ANTIBODY (ROUTINE TESTING W REFLEX): HIV Screen 4th Generation wRfx: NONREACTIVE

## 2018-09-19 MED ORDER — OXYCODONE HCL 5 MG PO TABS: 5.0000 mg | ORAL_TABLET | ORAL | Status: DC | PRN

## 2018-09-19 MED ORDER — SITAGLIPTIN PHOSPHATE 100 MG PO TABS: 100.0000 mg | ORAL_TABLET | Freq: Every day | ORAL | 0 refills | Status: DC

## 2018-09-19 MED ORDER — ATORVASTATIN CALCIUM 40 MG PO TABS: 40.0000 mg | ORAL_TABLET | Freq: Every day | ORAL | 0 refills | Status: DC

## 2018-09-19 MED ORDER — CARVEDILOL 6.25 MG PO TABS: 6.2500 mg | ORAL_TABLET | Freq: Two times a day (BID) | ORAL | 0 refills | Status: DC

## 2018-09-19 MED ORDER — TAMSULOSIN HCL 0.4 MG PO CAPS: 0.4000 mg | ORAL_CAPSULE | Freq: Every day | ORAL | 0 refills | Status: DC

## 2018-09-19 MED ORDER — INSULIN ASPART 100 UNIT/ML ~~LOC~~ SOLN: 0.0000 [IU] | Freq: Two times a day (BID) | SUBCUTANEOUS | Status: DC

## 2018-09-19 MED ORDER — LISINOPRIL 10 MG PO TABS: 10.0000 mg | ORAL_TABLET | Freq: Every day | ORAL | 0 refills | Status: DC

## 2018-09-19 NOTE — Consult Note (Signed)
Cardiology Consult    Patient ID: Danny Brewer MRN: [id number] GutridgeE: 09/28/1970   Admit date: 09/18/2018 Date of Consult: 09/19/2018  Primary Physician: Ileana Ladd, MD Primary Cardiologist: New to [facility] (Dr. Delton See) Requesting Provider: Sharol Roussel, MD  Patient Profile    Danny Brewer is a 48 y.o. male with a history of type 2 diabetes mellitus, hypertenson GERD, inguinal hernia s/p repair, who is being seen today for the evaluation of chest pain at the request of Dr. Annamary Rummage.  History of Present Illness    Danny Brewer is a 48 year old male with no known CAD. He thinks he has had 2 stress test in the past both of which came back negative. He is not followed by a Cardiologist. Patient has been in his usual state of health until yesterday when he developed chest pain.  Given the current COVID-19 pandemic and in an effort to minimize exposure, I called into the patient's room to obtain a history. 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 Last night, he developed sudden onset of left-sided chest pain/left shoulder pain that seemed to radiate throughout his chest and back while eating dinner. Patient describes the pain as a pressure and states it just hurt. He ranks the pain as a 6-7/10 on the pain scale. He had some lightheadedness last night with pain and felt like his heart was racing but states this might of been because he was starting to feel anxious. He denies any associated diaphoresis, shortness of breath, nausea/vomiting, or syncope. Pain worse with movement and reportedly reproducible when patient presses on his chest wall. Patient denies any other chest pain at rest or with exertion prior to yesterday. No orthopnea, PND, or lower extremity edema. No recent fever, chills, or body aches. He has had some nasal drainage which he states is secondary to one of the medications he takes but denies any cough or exposure to COVID-19.   In the ED, vitals stable. EKG  showed normal sinus rhythm with RBBB and early repolarization changes. Chest x-ray showed no acute findings. WBC 6.4, Hgb 14.1, Plts 238. Na 138, Na 4.0, Glucose 115, SCr 1.08. Patient was admitted for further evaluation.  At the time of this evaluation, patient continues to report some mild chest discomfort that is worse with movement.   Patient denies any history of tobacco, alcohol, or recreational drug use. He does have a significant family history of heart disease on his father side of the family. His brother dying from a massive heart attack at the age of 58 and multiple paternal uncles and cousins also died from heart attacks.  Past Medical History   Past Medical History:  Diagnosis Date  . Complication of anesthesia    Pt verble and "prophecizes" after anesthesia  . Diabetes mellitus without complication (HCC)   . GERD (gastroesophageal reflux disease)   . Inguinal hernia 2016   right  . Kidney stones   . Type 1 diabetes (HCC)   . Vision abnormalities     Past Surgical History:  Procedure Laterality Date  . HAND SURGERY Left 1987  . INGUINAL HERNIA REPAIR Bilateral 05/27/2015   Procedure: LAPAROSCOPIC BILATERAL  INGUINAL HERNIA REPAIRS WITH MESH;  Surgeon: Karie Soda, MD;  Location: [facility];  Service: General;  Laterality: Bilateral;  . KNEE SURGERY       Allergies  Allergies  Allergen Reactions  . Antihistamines, Diphenhydramine-Type Hives and Other (See Comments)    Seizures  . Demerol Hives  .  Morphine And Related Hives    Esp with high dose morphine, possibly Dilaudid Tolerates hydrocodone, oxycodone    Inpatient Medications    . aspirin EC  81 mg Oral Daily  . atorvastatin  40 mg Oral q1800  . carvedilol  6.25 mg Oral BID WC  . enoxaparin (LOVENOX) injection  40 mg Subcutaneous Q24H  . insulin aspart  0-9 Units Subcutaneous BID  . lisinopril  10 mg Oral Daily  . sodium chloride flush  3 mL Intravenous Q12H  . tamsulosin  0.4 mg Oral QHS    Family  History    Family History  Problem Relation Age of Onset  . Hypertension Mother   . Cancer Father   . Heart failure Father   . Sudden death Brother   . Heart attack Brother   . Hypertension Sister   . Heart murmur Sister    He indicated that his mother is alive. He indicated that his father is deceased. He indicated that his sister is alive. He indicated that his brother is deceased.   Social History    Social History   Socioeconomic History  . Marital status: Married    Spouse name: Not on file  . Number of children: Not on file  . Years of education: Not on file  . Highest education level: Not on file  Occupational History  . Not on file  Social Needs  . Financial resource strain: Not on file  . Food insecurity:    Worry: Not on file    Inability: Not on file  . Transportation needs:    Medical: Not on file    Non-medical: Not on file  Tobacco Use  . Smoking status: Never Smoker  . Smokeless tobacco: Never Used  Substance and Sexual Activity  . Alcohol use: No  . Drug use: No  . Sexual activity: Not on file  Lifestyle  . Physical activity:    Days per week: Not on file    Minutes per session: Not on file  . Stress: Not on file  Relationships  . Social connections:    Talks on phone: Not on file    Gets together: Not on file    Attends religious service: Not on file    Active member of club or organization: Not on file    Attends meetings of clubs or organizations: Not on file    Relationship status: Not on file  . Intimate partner violence:    Fear of current or ex partner: Not on file    Emotionally abused: Not on file    Physically abused: Not on file    Forced sexual activity: Not on file  Other Topics Concern  . Not on file  Social History Narrative  . Not on file     Review of Systems    Review of Systems  Constitutional: Negative for chills, diaphoresis and fever.  HENT: Negative for sore throat.   Respiratory: Negative for cough,  hemoptysis and shortness of breath.   Cardiovascular: Positive for chest pain and palpitations. Negative for orthopnea, leg swelling and PND.  Gastrointestinal: Negative for nausea and vomiting.  Genitourinary: Negative for hematuria.  Musculoskeletal: Negative for myalgias.  Neurological: Negative for loss of consciousness.  Endo/Heme/Allergies: Does not bruise/bleed easily.  Psychiatric/Behavioral: Negative for substance abuse.    Physical Exam    Physical Exam per MD:  Blood pressure (!) 130/93, pulse 79, temperature 98.2 F (36.8 C), temperature source Oral, resp. rate 20, height 5'  9" (1.753 m), weight 91.4 kg, SpO2 99 %.  General: 48 y.o. male resting comfortably in no acute distress. Pleasant and cooperative. HEENT: Normal  Neck: Supple. No carotid bruits or JVD appreciated. Lungs: No increased work of breathing. Clear to auscultation bilaterally. No wheezes, rhonchi, or rales. Heart: RRR. Distinct S1 and S2. No murmurs, gallops, or rubs.  Abdomen: Soft, non-distended, and non-tender to palpation. Bowel sounds present in all 4 quadrants.   Extremities: No clubbing, cyanosis or edema. Radial, posterior tibial, and distal pedal pulses 2+ and equal bilaterally. Skin: Warm and dry. Neuro: Alert and oriented x3. No focal deficits. Moves all extremities spontaneously. Psych: Normal affect.   Labs    Troponin (Point of Care Test) No results for input(s): TROPIPOC in the last 72 hours. Recent Labs    09/18/18 1751 09/18/18 2119 09/19/18 0243  TROPONINI <0.03 <0.03 <0.03   Lab Results  Component Value Date   WBC 6.4 09/18/2018   HGB 14.1 09/18/2018   HCT 43.9 09/18/2018   MCV 86.2 09/18/2018   PLT 238 09/18/2018    Recent Labs  Lab 09/18/18 1751  NA 138  K 4.0  CL 105  CO2 21*  BUN 12  CREATININE 1.08  CALCIUM 8.8*  GLUCOSE 115*   Lab Results  Component Value Date   CHOL 198 09/19/2018   HDL 46 09/19/2018   LDLCALC 139 (H) 09/19/2018   TRIG 67 09/19/2018    Lab Results  Component Value Date   DDIMER <0.27 08/27/2015     Radiology Studies    Dg Chest 2 View  Result Date: 09/18/2018 CLINICAL DATA:  Chest pain, dizziness. EXAM: CHEST - 2 VIEW COMPARISON:  Chest x-ray dated 08/12/2017. FINDINGS: Cardiomediastinal silhouette is within normal limits in size and configuration. Lungs are clear. Lung volumes are normal. No evidence of pneumonia. No pleural effusion. No pneumothorax seen. Osseous structures about the chest are unremarkable. IMPRESSION: No active cardiopulmonary disease. No evidence of pneumonia or pulmonary edema. Electronically Signed   By: Bary Richard M.D.   On: 09/18/2018 18:08    EKG     EKG: EKG was personally reviewed and demonstrates: Normal sinus rhythm, rate 84 bpm, with RBBB and early repolarization changes.  Telemetry: Telemetry was personally reviewed and demonstrates: Normal sinus rhythm without significant arrhythmia  Cardiac Imaging    Echocardiogram 08/27/2015: Study Conclusions: - Left ventricle: The cavity size was normal. There was mild   concentric hypertrophy. Systolic function was vigorous. The   estimated ejection fraction was in the range of 65% to 70%. Wall   motion was normal; there were no regional wall motion   abnormalities. - Aortic valve: Trileaflet; normal thickness leaflets. There was no   regurgitation. - Aortic root: The aortic root was normal in size. - Mitral valve: There was trivial regurgitation. - Right ventricle: The cavity size was normal. Wall thickness was   normal. Systolic function was normal. - Tricuspid valve: There was trivial regurgitation. - Pulmonary arteries: Systolic pressure was within the normal   range. - Inferior vena cava: The vessel was normal in size. - Pericardium, extracardiac: There was no pericardial effusion.  Assessment & Plan    Chest Pain - Patient presents with chest pain that is worse with movement and reproducible with palpation of chest wall.   - EKG showed normal sinus rhythm with RBBB and early repolarization changes. - Troponin negative x3.  - Echo has been ordered. - Patient continues to have some chest discomfort this morning. -  Aspirin, Coreg, and Lipitor were started on admission due to concern for ACS. - Presentation is atypical and sounds musculoskeletal. Cardiovascular risk factors include diabetes, dyslipidemia, and hypertension. Given patient's significant family history, may consider coronary CT to further evaluate coronary anatomy. Will discuss with MD.  Hypertension - Most recent BP 130/93. - Continue home Lisinopril.  Dyslipidemia - Lipid panel this admission: Cholesterol 198, Triglycerides 67, HDL 46, LDL 139. - Continue Lipitor 40mg  daily.  Signed, Corrin Parkerallie E Goodrich, PA-C 09/19/2018, 9:22 AM Pager: [phone] For questions or updates, please contact   Please consult [email or web address] for contact info under Cardiology/STEMI.  Patient seen, examined. Available data reviewed. Agree with findings, assessment, and plan as outlined by Marjie Skiffallie Goodrich, PA-C.   The patient is independently interviewed and examined.  The physical examination documented above reflects my personal exam findings.  The patient reports left upper chest pain and left shoulder pain, exacerbated by certain movements.  He had very similar symptoms about 12 years ago and had a stress test at that time which demonstrated no abnormalities.  The patient's symptoms have improved but he still has some mild discomfort in the left upper chest.  The patient's exam is notable for normal findings with the exception of reproducible chest wall tenderness when palpating the left pectoralis region into the axilla.  Troponin is negative x4, EKG shows no significant ischemic changes, echocardiogram is essentially normal with no regional wall motion abnormalities.  The patient's pain is atypical.  I do not think he requires any further inpatient evaluation.  It  would be reasonable to perform a gated coronary CTA as an outpatient once Covid-19 restrictions are lifted.  It would be reasonable to add back a high intensity statin drug in the setting of this patient's diabetes and mixed hyperlipidemia.  His LDL cholesterol is greater than 130 mg/dL.  He has previously been on atorvastatin and would start him at a dose of 40 mg daily.  Will arrange his outpatient evaluation.  He appears stable for hospital discharge from a cardiac perspective.  Tonny BollmanMichael Jabin Tapp, M.D. 09/19/2018 1:45 PM

## 2018-09-19 NOTE — Progress Notes (Signed)
Pt asking about POC, pt is NPO per order, ? Stress test, paged Dr Dayna Barker per pt request

## 2018-09-19 NOTE — Plan of Care (Signed)
  Problem: Activity: Goal: Ability to tolerate increased activity will improve Outcome: Completed/Met   Problem: Health Behavior/Discharge Planning: Goal: Ability to safely manage health-related needs after discharge will improve Outcome: Completed/Met

## 2018-09-19 NOTE — Progress Notes (Signed)
  Echocardiogram 2D Echocardiogram has been performed.  Danny Brewer 09/19/2018, 11:31 AM

## 2018-09-19 NOTE — Discharge Summary (Addendum)
Physician Discharge Summary  Danny Brewer OIB:[id number] DOB: [birth year] DOA: 09/18/2018  PCP: Ileana Ladd, MD  Admit date: 09/18/2018 Discharge date: 09/19/2018  Admitted From: Home Disposition: Home  Recommendations for Outpatient Follow-up:  1. Follow up with PCP in 1-2 weeks 2. Follow-up with cardiology as outpatient  Home Health: None Equipment/Devices: None Discharge Condition: Stable CODE STATUS: Full code Diet recommendation: Cardiac and diabetic diet.  Brief/Interim Summary: 48 y.o.  male with type 2 diabetes, hypertension, GERD BPH strong family history of MI in his brother at early age and sudden death in another brother at the age of 24.He was with acute onset of left-sided retrosternal chest pain with radiation to the left arm and shoulder. 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  He was seen in the ER. ECG showed sinus rhythm at 85/min with non specific repolarization abnormality (RSR in V1 and V2). No acute ST-T wave changes. I personally reviewed the ECG. given his HEART score of 5 was admitted, serial troponins were obtained and are negative.  Cardiology was consulted, echo was obtained which was unremarkable.  Felt the pain was more from musculoskeletal in nature did not recommend any inpatient stress test and advised follow-up outpatient was there arrange and cleared for discharge home.  Discussed with Dr Excell Seltzer from cardiology.  Other issues: T2DM blood sugar stable.  Resume home meds upon discharge, on sliding scale while here. HLD:ldl 139, continue Lipitor. GERD:ppi Hypertension blood pressure stable , cont home lisinopril BPH: Flomax. Patient will be provided with prescription for his medications.  Discharge Diagnoses:  Principal Problem:   Chest pain with high risk of acute coronary syndrome    Discharge Instructions  Discharge Instructions    Call MD for:    Complete by:  As directed    Please call call MD or return to ER for similar recurring problem, nausea/vomiting, uncontrolled pain, abdominal pain chest pain, shortness of breath, fever. Please follow-up your doctor as instructed in a week time and call the office for appointment. Please avoid alcohol, smoking, or any other illicit substance.   Diet - low sodium heart healthy   Complete by:  As directed    Increase activity slowly   Complete by:  As directed      Allergies as of 09/19/2018      Reactions   Antihistamines, Diphenhydramine-type Hives, Other (See Comments)   Seizures   Demerol Hives   Morphine And Related Hives   Esp with high dose morphine, possibly Dilaudid Tolerates hydrocodone, oxycodone      Medication List    TAKE these medications   atorvastatin 40 MG tablet Commonly known as:  LIPITOR Take 1 tablet (40 mg total) by mouth daily at 6 PM for 30 days.   lisinopril 10 MG tablet Commonly known as:  PRINIVIL,ZESTRIL Take 1 tablet (10 mg total) by mouth daily for 30 days.   sitaGLIPtin 100 MG tablet Commonly known as:  JANUVIA Take 1 tablet (100 mg total) by mouth at bedtime for 30 days.   tamsulosin 0.4 MG Caps capsule Commonly known as:  FLOMAX Take 1 capsule (0.4 mg total) by mouth at bedtime for 30 days.      Follow-up Information    Ileana Ladd, MD.   Specialty:  Family Medicine Why:  Patient will call doctor offices after discharge to setup appointment Contact information: [address] [place] [address]  [phone]          Allergies  Allergen Reactions  . Antihistamines, Diphenhydramine-Type Hives and Other (See Comments)    Seizures  . Demerol Hives  . Morphine And Related Hives    Esp with high dose morphine, possibly Dilaudid Tolerates hydrocodone, oxycodone    Consultations:  Cardiology    Procedures/Studies: Dg Chest 2 View  Result Date: 09/18/2018 CLINICAL DATA:  Chest pain, dizziness. EXAM: CHEST - 2  VIEW COMPARISON:  Chest x-ray dated 08/12/2017. FINDINGS: Cardiomediastinal silhouette is within normal limits in size and configuration. Lungs are clear. Lung volumes are normal. No evidence of pneumonia. No pleural effusion. No pneumothorax seen. Osseous structures about the chest are unremarkable. IMPRESSION: No active cardiopulmonary disease. No evidence of pneumonia or pulmonary edema. Electronically Signed   By: Bary Richard M.D.   On: 09/18/2018 18:08    Subjective: Resting well.  Pain much improved.  Denies nausea vomiting shortness of breath fever chills.  Discharge Exam: Vitals:   09/19/18 0823 09/19/18 1228  BP: (!) 130/93 124/88  Pulse: 79 75  Resp: 20   Temp: 98.2 F (36.8 C) 98.1 F (36.7 C)  SpO2: 99% 100%   Vitals:   09/18/18 2344 09/19/18 0530 09/19/18 0823 09/19/18 1228  BP: 125/87 (!) 126/94 (!) 130/93 124/88  Pulse: 74 81 79 75  Resp: Temp: 97.8 F (36.6 C) 97.7 F (36.5 C) 98.2 F (36.8 C) 98.1 F (36.7 C)  TempSrc: Oral Oral Oral Oral  SpO2: 99% 99% 99% 100%  Weight:  91.4 kg    Height:        General: Pt is alert, awake, not in acute distress Cardiovascular: RRR, S1/S2 +, no rubs, no gallops Respiratory: CTA bilaterally, no wheezing, no rhonchi Abdominal: Soft, NT, ND, bowel sounds + Extremities: no edema, no cyanosis   The results of significant diagnostics from this hospitalization (including imaging, microbiology, ancillary and laboratory) are listed below for reference.     Microbiology: No results found for this or any previous visit (from the past 240 hour(s)).   Labs: BNP (last 3 results) No results for input(s): BNP in the last 8760 hours. Basic Metabolic Panel: Recent Labs  Lab 09/18/18 1751 09/18/18 2119  NA 138  --   K 4.0  --   CL 105  --   CO2 21*  --   GLUCOSE 115*  --   BUN 12  --   CREATININE 1.08  --   CALCIUM 8.8*  --   MG  --  2.0   Liver Function Tests: No results for input(s): AST, ALT, ALKPHOS,  BILITOT, PROT, ALBUMIN in the last 168 hours. No results for input(s): LIPASE, AMYLASE in the last 168 hours. No results for input(s): AMMONIA in the last 168 hours. CBC: Recent Labs  Lab 09/18/18 1751  WBC 6.4  NEUTROABS 3.3  HGB 14.1  HCT 43.9  MCV 86.2  PLT 238   Cardiac Enzymes: Recent Labs  Lab 09/18/18 1751 09/18/18 2119 09/19/18 0243 09/19/18 0822  TROPONINI <0.03 <0.03 <0.03 <0.03   BNP: Invalid input(s): POCBNP CBG: Recent Labs  Lab 09/19/18 0027 09/19/18 0551 09/19/18 1231  GLUCAP 136* 104* 85   D-Dimer No results for input(s): DDIMER in the last 72 hours. Hgb A1c No results for input(s): HGBA1C in the last 72 hours. Lipid Profile Recent Labs    09/19/18 0243  CHOL 198  HDL 46  LDLCALC 139*  TRIG 67  CHOLHDL 4.3  Thyroid function studies Recent Labs    09/18/18 2119  TSH 3.900   Anemia work up No results for input(s): VITAMINB12, FOLATE, FERRITIN, TIBC, IRON, RETICCTPCT in the last 72 hours. Urinalysis    Component Value Date/Time   COLORURINE YELLOW 03/22/2018 1936   APPEARANCEUR CLEAR 03/22/2018 1936   LABSPEC 1.025 03/22/2018 1936   PHURINE 6.5 03/22/2018 1936   GLUCOSEU NEGATIVE 03/22/2018 1936   HGBUR NEGATIVE 03/22/2018 1936   BILIRUBINUR NEGATIVE 03/22/2018 1936   KETONESUR NEGATIVE 03/22/2018 1936   PROTEINUR NEGATIVE 03/22/2018 1936   UROBILINOGEN 0.2 11/26/2014 1518   NITRITE NEGATIVE 03/22/2018 1936   LEUKOCYTESUR NEGATIVE 03/22/2018 1936   Sepsis Labs Invalid input(s): PROCALCITONIN,  WBC,  LACTICIDVEN Microbiology No results found for this or any previous visit (from the past 240 hour(s)).   Time coordinating discharge: 25 minutes  SIGNED:   Lanae Boastamesh Maycel Riffe, MD  [facility] 09/19/2018, 2:25 PM  If 7PM-7AM, please contact night-coverage [email or web address]

## 2018-09-29 ENCOUNTER — Telehealth: Payer: Self-pay | Admitting: Physician Assistant

## 2018-09-29 NOTE — Telephone Encounter (Signed)
Video/Doximity/verbal consent/09/29/18 S/P hosp 4/15/  S/p ct 4/30    See d/c instruction: From: Corrin Parker, PA-C  Sent: 09/23/2018  3:06 PM EDT  To: [facility] Scheduling  Subject: Follow Up Visit                  This patient was admitted earlier this week for chest pain. Plan is for patient to have an outpatient coronary CT once the COVID-19 restrictions have been lifted and a follow-up appointment after that. I have contacted Jasmine December to help with setting up the coronary CT. But can you please schedule this patient a follow-up with Tereso Newcomer after the CT and notify him of the appointment time?   Thank you so much!  Callie    YOUR CARDIOLOGY TEAM HAS ARRANGED FOR AN E-VISIT FOR YOUR APPOINTMENT - PLEASE REVIEW IMPORTANT INFORMATION BELOW SEVERAL DAYS PRIOR TO YOUR APPOINTMENT  Due to the recent COVID-19 pandemic, we are transitioning in-person office visits to tele-medicine visits in an effort to decrease unnecessary exposure to our patients, their families, and staff. These visits are billed to your insurance just like a normal visit is. We also encourage you to sign up for MyChart if you have not already done so. You will need a smartphone if possible. For patients that do not have this, we can still complete the visit using a regular telephone but do prefer a smartphone to enable video when possible. You may have a family member that lives with you that can help. If possible, we also ask that you have a blood pressure cuff and scale at home to measure your blood pressure, heart rate and weight prior to your scheduled appointment. Patients with clinical needs that need an in-person evaluation and testing will still be able to come to the office if absolutely necessary. If you have any questions, feel free to call our office.      YOUR PROVIDER WILL BE USING THE FOLLOWING PLATFORM TO COMPLETE YOUR VISIT:   Doximity      IF USING MYCHART - How to Download  the MyChart App to Your SmartPhone   - If Apple, go to [employer] and type in MyChart in the search bar and download the app. If Android, ask patient to go to [employer] and type in [place] in the search bar and download the app. The app is free but as with any other app downloads, your phone may require you to verify saved payment information or Apple/Android password.  - You will need to then log into the app with your MyChart username and password, and select [facility] as your healthcare provider to link the account.  - When it is time for your visit, go to the MyChart app, find appointments, and click Begin Video Visit. Be sure to Select Allow for your device to access the Microphone and Camera for your visit. You will then be connected, and your provider will be with you shortly.  **If you have any issues connecting or need assistance, please contact MyChart service desk [phone])**  **If using a computer, in order to ensure the best quality for your visit, you will need to use either of the following Internet Browsers: Agricultural consultant or Microsoft Edge**   IF USING DOXIMITY or [email or web address] - The staff will give you instructions on receiving your link to join the meeting the day of your visit.      2-3 DAYS BEFORE YOUR APPOINTMENT  You  will receive a telephone call from one of our [facility] team members - your caller ID may say "Unknown caller." If this is a video visit, we will walk you through how to get the video launched on your phone. We will remind you check your blood pressure, heart rate and weight prior to your scheduled appointment. If you have an Apple Watch or Kardia, please upload any pertinent ECG strips the day before or morning of your appointment to MyChart. Our staff will also make sure you have reviewed the consent and agree to move forward with your scheduled tele-health visit.     THE DAY OF YOUR APPOINTMENT  Approximately 15 minutes prior to your  scheduled appointment, you will receive a telephone call from one of HeartCare team - your caller ID may say "Unknown caller."  Our staff will confirm medications, vital signs for the day and any symptoms you may be experiencing. Please have this information available prior to the time of visit start. It may also be helpful for you to have a pad of paper and pen handy for any instructions given during your visit. They will also walk you through joining the smartphone meeting if this is a video visit.    CONSENT FOR TELE-HEALTH VISIT - PLEASE REVIEW  I hereby voluntarily request, consent and authorize [facility] and its employed or contracted physicians, physician assistants, nurse practitioners or other licensed health care professionals (the Practitioner), to provide me with telemedicine health care services (the Services") as deemed necessary by the treating Practitioner. I acknowledge and consent to receive the Services by the Practitioner via telemedicine. I understand that the telemedicine visit will involve communicating with the Practitioner through live audiovisual communication technology and the disclosure of certain medical information by electronic transmission. I acknowledge that I have been given the opportunity to request an in-person assessment or other available alternative prior to the telemedicine visit and am voluntarily participating in the telemedicine visit.  I understand that I have the right to withhold or withdraw my consent to the use of telemedicine in the course of my care at any time, without affecting my right to future care or treatment, and that the Practitioner or I may terminate the telemedicine visit at any time. I understand that I have the right to inspect all information obtained and/or recorded in the course of the telemedicine visit and may receive copies of available information for a reasonable fee.  I understand that some of the potential risks of receiving  the Services via telemedicine include:   Delay or interruption in medical evaluation due to technological equipment failure or disruption;  Information transmitted may not be sufficient (e.g. poor resolution of images) to allow for appropriate medical decision making by the Practitioner; and/or   In rare instances, security protocols could fail, causing a breach of personal health information.  Furthermore, I acknowledge that it is my responsibility to provide information about my medical history, conditions and care that is complete and accurate to the best of my ability. I acknowledge that Practitioner's advice, recommendations, and/or decision may be based on factors not within their control, such as incomplete or inaccurate data provided by me or distortions of diagnostic images or specimens that may result from electronic transmissions. I understand that the practice of medicine is not an exact science and that Practitioner makes no warranties or guarantees regarding treatment outcomes. I acknowledge that I will receive a copy of this consent concurrently upon execution via email to the email  address I last provided but may also request a printed copy by calling the office of [facility].    I understand that my insurance will be billed for this visit.   I have read or had this consent read to me.  I understand the contents of this consent, which adequately explains the benefits and risks of the Services being provided via telemedicine.   I have been provided ample opportunity to ask questions regarding this consent and the Services and have had my questions answered to my satisfaction.  I give my informed consent for the services to be provided through the use of telemedicine in my medical care  By participating in this telemedicine visit I agree to the above.

## 2018-10-05 ENCOUNTER — Telehealth (HOSPITAL_COMMUNITY): Payer: Self-pay | Admitting: Emergency Medicine

## 2018-10-05 NOTE — Telephone Encounter (Signed)
Reaching out to patient to offer assistance regarding upcoming cardiac imaging study; pt verbalizes understanding of appt date/time, parking situation and where to check in, pre-test NPO status and medications ordered, and verified current allergies; name and call back number provided for further questions should they arise Rockwell Alexandria RN Navigator Cardiac Imaging [facility] [phone] office [phone] cell  *pt denies symptoms of COVID19 **pt verbalized understanding of visitor policy

## 2018-10-06 ENCOUNTER — Ambulatory Visit (HOSPITAL_COMMUNITY)
Admission: RE | Admit: 2018-10-06 | Discharge: 2018-10-06 | Disposition: A | Discharge status: Home / Self Care | Payer: BC Managed Care – PPO | Source: Ambulatory Visit | Attending: Student | Admitting: Student

## 2018-10-06 ENCOUNTER — Other Ambulatory Visit: Payer: Self-pay

## 2018-10-06 DIAGNOSIS — I25119 Atherosclerotic heart disease of native coronary artery with unspecified angina pectoris: Secondary | ICD-10-CM

## 2018-10-06 DIAGNOSIS — R079 Chest pain, unspecified: Secondary | ICD-10-CM | POA: Insufficient documentation

## 2018-10-06 DIAGNOSIS — Z006 Encounter for examination for normal comparison and control in clinical research program: Secondary | ICD-10-CM

## 2018-10-06 MED ORDER — NITROGLYCERIN 0.4 MG SL SUBL
0.8000 mg | SUBLINGUAL_TABLET | Freq: Once | SUBLINGUAL | Status: AC
  Administered 2018-10-06: 10:00:00 0.8 mg via SUBLINGUAL
  Filled 2018-10-06: qty 25

## 2018-10-06 MED ORDER — NITROGLYCERIN 0.4 MG SL SUBL
SUBLINGUAL_TABLET | SUBLINGUAL | Status: AC
  Filled 2018-10-06: qty 2

## 2018-10-06 MED ORDER — METOPROLOL TARTRATE 5 MG/5ML IV SOLN
5.0000 mg | INTRAVENOUS | Status: DC | PRN
  Administered 2018-10-06: 5 mg via INTRAVENOUS
  Filled 2018-10-06 (×2): qty 5

## 2018-10-06 MED ORDER — METOPROLOL TARTRATE 5 MG/5ML IV SOLN
INTRAVENOUS | Status: AC
  Administered 2018-10-06: 10:00:00 5 mg
  Filled 2018-10-06: qty 20

## 2018-10-06 MED ORDER — IOHEXOL 350 MG/ML SOLN
100.0000 mL | Freq: Once | INTRAVENOUS | Status: AC | PRN
  Administered 2018-10-06: 100 mL via INTRAVENOUS

## 2018-10-06 NOTE — Research (Signed)
Cadfem Informed Consent    Patient Name: Danny Brewer   Subject met inclusion and exclusion criteria.  The informed consent form, study requirements and expectations were reviewed with the subject and questions and concerns were addressed prior to the signing of the consent form.  The subject verbalized understanding of the trail requirements.  The subject agreed to participate in the CADFEM trial and signed the informed consent.  The informed consent was obtained prior to performance of any protocol-specific procedures for the subject.  A copy of the signed informed consent was given to the subject and a copy was placed in the subject's medical record.   Neva Seat

## 2018-10-10 NOTE — Progress Notes (Signed)
Virtual Visit via Video Note   This visit type was conducted due to national recommendations for restrictions regarding the COVID-19 Pandemic (e.g. social distancing) in an effort to limit this patient's exposure and mitigate transmission in our community.  Due to his co-morbid illnesses, this patient is at least at moderate risk for complications without adequate follow up.  This format is felt to be most appropriate for this patient at this time.  All issues noted in this document were discussed and addressed.  A limited physical exam was performed with this format.  Please refer to the patient's chart for his consent to telehealth for [facility].   Date:  10/11/2018   ID:  Danny Boeralph Cerveny, DOB 14-Aug-1970, MRN [id number]  Patient Location: Home Provider Location: Home  PCP:  Ileana LaddWong, Francis P, MD  Cardiologist:  Tonny BollmanMichael Cooper, MD   Electrophysiologist:  None   Evaluation Performed:  Follow-Up Visit  Chief Complaint:  Post hospital follow up; CAD with angina  History of Present Illness:    Danny Brewer is a 48 y.o. male with diabetes, hypertension, hyperlipidemia, GERD, inguinal hernia s/p repair.  He was evaluated in the hospital by Dr. Excell Seltzerooper on 09/19/2018 for chest pain.  His symptoms were more consistent with musculoskeletal pain.  Cardiac enzymes remained negative.  An echocardiogram demonstrated normal ejection fraction and no wall motion abnormalities.  He was set up for a coronary CTA given his significant risk factors for coronary artery disease.  This was done last week and demonstrated hemodynamically significant stenosis in the proximal LAD, D1 and D2.  The study was reviewed by Dr. Excell Seltzerooper who has recommended proceeding the Cardiac Catheterization.    Today, he notes continued left-sided chest discomfort.  He notes this with certain types of exertion as well as at rest.  The symptoms have been stable since he was in the hospital in April without significant change.  He notes  associated shortness of breath.  He has not had syncope, orthopnea, paroxysmal nocturnal dyspnea or lower extremity swelling.  He also has some left-sided shoulder discomfort that is worse with positional changes.  The patient does not have symptoms concerning for COVID-19 infection (fever, chills, cough, or new shortness of breath).    Past Medical History:  Diagnosis Date   Complication of anesthesia    Pt verble and "prophecizes" after anesthesia   Coronary Artery Disease    Coronary CTA 09/2018: pLAD 70-90; pD1+pD2 70-90; mRCA mixed plaque - cannot rule out 70-90; FFR suggests hemodynamically significant stenosis   Diabetes mellitus without complication (HCC)    Echocardiogram 09/2018    Echo 09/2018: EF 60-65   GERD (gastroesophageal reflux disease)    Inguinal hernia 2016   right   Kidney stones    Vision abnormalities    Past Surgical History:  Procedure Laterality Date   HAND SURGERY Left 1987   INGUINAL HERNIA REPAIR Bilateral 05/27/2015   Procedure: LAPAROSCOPIC BILATERAL  INGUINAL HERNIA REPAIRS WITH MESH;  Surgeon: Karie SodaSteven Gross, MD;  Location: [facility];  Service: General;  Laterality: Bilateral;   KNEE SURGERY       Current Meds  Medication Sig   atorvastatin (LIPITOR) 40 MG tablet Take 40 mg by mouth daily.   lisinopril (PRINIVIL,ZESTRIL) 10 MG tablet Take 1 tablet (10 mg total) by mouth daily for 30 days.   sitaGLIPtin (JANUVIA) 100 MG tablet Take 1 tablet (100 mg total) by mouth at bedtime for 30 days.   tamsulosin (FLOMAX) 0.4 MG CAPS capsule Take  1 capsule (0.4 mg total) by mouth at bedtime for 30 days.     Allergies:   Antihistamines, diphenhydramine-type; Demerol; and Morphine and related   Social History   Tobacco Use   Smoking status: Never Smoker   Smokeless tobacco: Never Used  Substance Use Topics   Alcohol use: No   Drug use: No     Family Hx: The patient's family history includes Cancer in his father; Heart attack in his  brother; Heart failure in his father; Heart murmur in his sister; Hypertension in his mother and sister; Sudden death in his brother.  ROS:   Please see the history of present illness.    All other systems reviewed and are negative.   Prior CV studies:   The following studies were reviewed today:  Coronary CTA 10/06/2018 NON-CARDIAC IMPRESSION: No significant incidental findings.  CARDIAC Coronary Arteries: Right dominant with no anomalies LM: No plaque or stenosis. LAD system: Extensive mixed plaque with severe (70-90%) stenosis in the proximal LAD. D1 and D2 are small to moderate vessels and appear to have significant mixed plaque proximally (70-90% stenosis). Circumflex system: Relatively small vessel, no plaque or stenosis. RCA system: The mid RCA is obscured by motion artifact in all phases. However, there is mixed plaque in this area as well. Cannot rule out severe (70-90%) stenosis.  IMPRESSION: 1. Coronary artery calcium score 448 Agatston units. This places the patient in the 98th percentile for age and gender, suggesting high risk of future cardiac events. 2. Suspected severe proximal LAD stenosis. There also appears to be significant disease in small-moderate D1 and D2. 3. Mid RCA is obscured by motion artifact but also has significant plaque. Possible severe (70-90%) stenosis.  FFR FINDINGS: FFR 0.61 mid D1 FFR 0.73 mid D2 FFR 0.74 mid LAD and <0.5 distal LAD Unable to interpret RCA due to artifact. No obstructive disease in LCx.  IMPRESSION: Hemodynamically significant stenosis in the proximal LAD and proximal D1 and D2. Unable to interpret RCA due to artifact. Suggest cardiac cath.   Echo 09/19/2018 EF 60-65  Echo 08/27/15 Mild conc LVH, EF 65-70, no RWMA, trivial MR, trivial TR    Labs/Other Tests and Data Reviewed:    EKG:  An ECG dated 09/18/2017 was personally reviewed today and demonstrated:  Sinus rhythm, RSR prime in V1, heart rate 80, QTc  411  Recent Labs: 03/22/2018: ALT 20 09/18/2018: BUN 12; Creatinine, Ser 1.08; Hemoglobin 14.1; Magnesium 2.0; Platelets 238; Potassium 4.0; Sodium 138; TSH 3.900   Recent Lipid Panel Lab Results  Component Value Date/Time   CHOL 198 09/19/2018 02:43 AM   TRIG 67 09/19/2018 02:43 AM   HDL 46 09/19/2018 02:43 AM   CHOLHDL 4.3 09/19/2018 02:43 AM   LDLCALC 139 (H) 09/19/2018 02:43 AM    Wt Readings from Last 3 Encounters:  10/11/18 200 lb (90.7 kg)  09/19/18 201 lb 9.6 oz (91.4 kg)  03/22/18 198 lb (89.8 kg)     Objective:    Vital Signs:  Ht 5\' 9"  (1.753 m)    Wt 200 lb (90.7 kg)    BMI 29.53 kg/m    VITAL SIGNS:  reviewed GEN:  no acute distress EYES:  sclerae anicteric, EOMI - Extraocular Movements Intact RESPIRATORY:  normal respiratory effort, symmetric expansion NEURO:  alert and oriented x 3, no obvious focal deficit PSYCH:  normal affect  ASSESSMENT & PLAN:    Coronary artery disease involving native coronary artery of native heart with angina pectoris (HCC) He was  admitted last month with chest pain and ruled out for myocardial infarction.  Follow-up coronary CTA demonstrated a calcium score of 448 and hemodynamically significant stenosis in the proximal LAD, proximal D1 and D2 by FFR analysis.  He continues to have chest discomfort.  This seems to occur with exertion as well as at rest.  The symptoms have been stable without change since he was in the hospital.  Cardiac catheterization has been recommended.  Risks and benefits of cardiac catheterization have been discussed with the patient.  These include bleeding, infection, kidney damage, stroke, heart attack, death.  The patient understands these risks and is willing to proceed.    -Continue atorvastatin  -Start aspirin 81 mg daily  -Start metoprolol succinate 25 mg daily  -Prescription for as needed nitroglycerin has been provided  -Cardiac catheterization be arranged later this week  Type 2 diabetes mellitus  without complication, without long-term current use of insulin (HCC) Continue sitagliptin and follow-up with primary care.  Essential hypertension He is unable to monitor his blood pressure.  Hyperlipidemia, unspecified hyperlipidemia type Continue high-dose statin therapy.  COVID-19 Education: The signs and symptoms of COVID-19 were discussed with the patient and how to seek care for testing (follow up with PCP or arrange E-visit).  He has been notified that he should contact our office should he develop symptoms prior to cardiac catheterization.  The importance of social distancing was discussed today.  Time:   Today, I have spent 25 minutes with the patient with telehealth technology discussing the above problems.     Medication Adjustments/Labs and Tests Ordered: Current medicines are reviewed at length with the patient today.  Concerns regarding medicines are outlined above.   Tests Ordered: No orders of the defined types were placed in this encounter.   Medication Changes: Meds ordered this encounter  Medications   metoprolol succinate (TOPROL XL) 25 MG 24 hr tablet    Sig: Take 1 tablet (25 mg total) by mouth daily.    Dispense:  30 tablet    Refill:  11    Order Specific Question:   Supervising Provider    Answer:   Lewayne Bunting [[id number]]   nitroGLYCERIN (NITROSTAT) 0.4 MG SL tablet    Sig: Place 1 tablet (0.4 mg total) under the tongue every 5 (five) minutes as needed for chest pain.    Dispense:  25 tablet    Refill:  11    Order Specific Question:   Supervising Provider    Answer:   Lewayne Bunting [[id number]]   aspirin 81 MG tablet    Sig: Take 1 tablet (81 mg total) by mouth daily.    Order Specific Question:   Supervising Provider    Answer:   Lewayne Bunting [[id number]]    Disposition:  Follow up in 2 week(s) post cath.  Signed, Tereso Newcomer, PA-C  10/11/2018 4:19 PM    [facility]

## 2018-10-10 NOTE — H&P (View-Only) (Signed)
Virtual Visit via Video Note   This visit type was conducted due to national recommendations for restrictions regarding the COVID-19 Pandemic (e.g. social distancing) in an effort to limit this patient's exposure and mitigate transmission in our community.  Due to his co-morbid illnesses, this patient is at least at moderate risk for complications without adequate follow up.  This format is felt to be most appropriate for this patient at this time.  All issues noted in this document were discussed and addressed.  A limited physical exam was performed with this format.  Please refer to the patient's chart for his consent to telehealth for [facility].   Date:  10/11/2018   ID:  Danny Boeralph Cerveny, DOB 14-Aug-1970, MRN [id number]  Patient Location: Home Provider Location: Home  PCP:  Ileana LaddWong, Francis P, MD  Cardiologist:  Tonny BollmanMichael Cooper, MD   Electrophysiologist:  None   Evaluation Performed:  Follow-Up Visit  Chief Complaint:  Post hospital follow up; CAD with angina  History of Present Illness:    Danny Brewer is a 48 y.o. male with diabetes, hypertension, hyperlipidemia, GERD, inguinal hernia s/p repair.  He was evaluated in the hospital by Dr. Excell Seltzerooper on 09/19/2018 for chest pain.  His symptoms were more consistent with musculoskeletal pain.  Cardiac enzymes remained negative.  An echocardiogram demonstrated normal ejection fraction and no wall motion abnormalities.  He was set up for a coronary CTA given his significant risk factors for coronary artery disease.  This was done last week and demonstrated hemodynamically significant stenosis in the proximal LAD, D1 and D2.  The study was reviewed by Dr. Excell Seltzerooper who has recommended proceeding the Cardiac Catheterization.    Today, he notes continued left-sided chest discomfort.  He notes this with certain types of exertion as well as at rest.  The symptoms have been stable since he was in the hospital in April without significant change.  He notes  associated shortness of breath.  He has not had syncope, orthopnea, paroxysmal nocturnal dyspnea or lower extremity swelling.  He also has some left-sided shoulder discomfort that is worse with positional changes.  The patient does not have symptoms concerning for COVID-19 infection (fever, chills, cough, or new shortness of breath).    Past Medical History:  Diagnosis Date   Complication of anesthesia    Pt verble and "prophecizes" after anesthesia   Coronary Artery Disease    Coronary CTA 09/2018: pLAD 70-90; pD1+pD2 70-90; mRCA mixed plaque - cannot rule out 70-90; FFR suggests hemodynamically significant stenosis   Diabetes mellitus without complication (HCC)    Echocardiogram 09/2018    Echo 09/2018: EF 60-65   GERD (gastroesophageal reflux disease)    Inguinal hernia 2016   right   Kidney stones    Vision abnormalities    Past Surgical History:  Procedure Laterality Date   HAND SURGERY Left 1987   INGUINAL HERNIA REPAIR Bilateral 05/27/2015   Procedure: LAPAROSCOPIC BILATERAL  INGUINAL HERNIA REPAIRS WITH MESH;  Surgeon: Karie SodaSteven Gross, MD;  Location: [facility];  Service: General;  Laterality: Bilateral;   KNEE SURGERY       Current Meds  Medication Sig   atorvastatin (LIPITOR) 40 MG tablet Take 40 mg by mouth daily.   lisinopril (PRINIVIL,ZESTRIL) 10 MG tablet Take 1 tablet (10 mg total) by mouth daily for 30 days.   sitaGLIPtin (JANUVIA) 100 MG tablet Take 1 tablet (100 mg total) by mouth at bedtime for 30 days.   tamsulosin (FLOMAX) 0.4 MG CAPS capsule Take  1 capsule (0.4 mg total) by mouth at bedtime for 30 days.     Allergies:   Antihistamines, diphenhydramine-type; Demerol; and Morphine and related   Social History   Tobacco Use   Smoking status: Never Smoker   Smokeless tobacco: Never Used  Substance Use Topics   Alcohol use: No   Drug use: No     Family Hx: The patient's family history includes Cancer in his father; Heart attack in his  brother; Heart failure in his father; Heart murmur in his sister; Hypertension in his mother and sister; Sudden death in his brother.  ROS:   Please see the history of present illness.    All other systems reviewed and are negative.   Prior CV studies:   The following studies were reviewed today:  Coronary CTA 10/06/2018 NON-CARDIAC IMPRESSION: No significant incidental findings.  CARDIAC Coronary Arteries: Right dominant with no anomalies LM: No plaque or stenosis. LAD system: Extensive mixed plaque with severe (70-90%) stenosis in the proximal LAD. D1 and D2 are small to moderate vessels and appear to have significant mixed plaque proximally (70-90% stenosis). Circumflex system: Relatively small vessel, no plaque or stenosis. RCA system: The mid RCA is obscured by motion artifact in all phases. However, there is mixed plaque in this area as well. Cannot rule out severe (70-90%) stenosis.  IMPRESSION: 1. Coronary artery calcium score 448 Agatston units. This places the patient in the 98th percentile for age and gender, suggesting high risk of future cardiac events. 2. Suspected severe proximal LAD stenosis. There also appears to be significant disease in small-moderate D1 and D2. 3. Mid RCA is obscured by motion artifact but also has significant plaque. Possible severe (70-90%) stenosis.  FFR FINDINGS: FFR 0.61 mid D1 FFR 0.73 mid D2 FFR 0.74 mid LAD and <0.5 distal LAD Unable to interpret RCA due to artifact. No obstructive disease in LCx.  IMPRESSION: Hemodynamically significant stenosis in the proximal LAD and proximal D1 and D2. Unable to interpret RCA due to artifact. Suggest cardiac cath.   Echo 09/19/2018 EF 60-65  Echo 08/27/15 Mild conc LVH, EF 65-70, no RWMA, trivial MR, trivial TR    Labs/Other Tests and Data Reviewed:    EKG:  An ECG dated 09/18/2017 was personally reviewed today and demonstrated:  Sinus rhythm, RSR prime in V1, heart rate 80, QTc  411  Recent Labs: 03/22/2018: ALT 20 09/18/2018: BUN 12; Creatinine, Ser 1.08; Hemoglobin 14.1; Magnesium 2.0; Platelets 238; Potassium 4.0; Sodium 138; TSH 3.900   Recent Lipid Panel Lab Results  Component Value Date/Time   CHOL 198 09/19/2018 02:43 AM   TRIG 67 09/19/2018 02:43 AM   HDL 46 09/19/2018 02:43 AM   CHOLHDL 4.3 09/19/2018 02:43 AM   LDLCALC 139 (H) 09/19/2018 02:43 AM    Wt Readings from Last 3 Encounters:  10/11/18 200 lb (90.7 kg)  09/19/18 201 lb 9.6 oz (91.4 kg)  03/22/18 198 lb (89.8 kg)     Objective:    Vital Signs:  Ht 5\' 9"  (1.753 m)    Wt 200 lb (90.7 kg)    BMI 29.53 kg/m    VITAL SIGNS:  reviewed GEN:  no acute distress EYES:  sclerae anicteric, EOMI - Extraocular Movements Intact RESPIRATORY:  normal respiratory effort, symmetric expansion NEURO:  alert and oriented x 3, no obvious focal deficit PSYCH:  normal affect  ASSESSMENT & PLAN:    Coronary artery disease involving native coronary artery of native heart with angina pectoris (HCC) He was  admitted last month with chest pain and ruled out for myocardial infarction.  Follow-up coronary CTA demonstrated a calcium score of 448 and hemodynamically significant stenosis in the proximal LAD, proximal D1 and D2 by FFR analysis.  He continues to have chest discomfort.  This seems to occur with exertion as well as at rest.  The symptoms have been stable without change since he was in the hospital.  Cardiac catheterization has been recommended.  Risks and benefits of cardiac catheterization have been discussed with the patient.  These include bleeding, infection, kidney damage, stroke, heart attack, death.  The patient understands these risks and is willing to proceed.   ° -Continue atorvastatin ° -Start aspirin 81 mg daily ° -Start metoprolol succinate 25 mg daily ° -Prescription for as needed nitroglycerin has been provided ° -Cardiac catheterization be arranged later this week ° °Type 2 diabetes mellitus  without complication, without long-term current use of insulin (HCC) °Continue sitagliptin and follow-up with primary care. ° °Essential hypertension °He is unable to monitor his blood pressure. ° °Hyperlipidemia, unspecified hyperlipidemia type °Continue high-dose statin therapy. ° °COVID-19 Education: °The signs and symptoms of COVID-19 were discussed with the patient and how to seek care for testing (follow up with PCP or arrange E-visit).  He has been notified that he should contact our office should he develop symptoms prior to cardiac catheterization.  The importance of social distancing was discussed today. ° °Time:   °Today, I have spent 25 minutes with the patient with telehealth technology discussing the above problems.   ° ° °Medication Adjustments/Labs and Tests Ordered: °Current medicines are reviewed at length with the patient today.  Concerns regarding medicines are outlined above.  ° °Tests Ordered: °No orders of the defined types were placed in this encounter. ° ° °Medication Changes: °Meds ordered this encounter  °Medications  °• metoprolol succinate (TOPROL XL) 25 MG 24 hr tablet  °  Sig: Take 1 tablet (25 mg total) by mouth daily.  °  Dispense:  30 tablet  °  Refill:  11  °  Order Specific Question:   Supervising Provider  °  Answer:   CRENSHAW, BRIAN S [[id number]]  °• nitroGLYCERIN (NITROSTAT) 0.4 MG SL tablet  °  Sig: Place 1 tablet (0.4 mg total) under the tongue every 5 (five) minutes as needed for chest pain.  °  Dispense:  25 tablet  °  Refill:  11  °  Order Specific Question:   Supervising Provider  °  Answer:   CRENSHAW, BRIAN S [[id number]]  °• aspirin 81 MG tablet  °  Sig: Take 1 tablet (81 mg total) by mouth daily.  °  Order Specific Question:   Supervising Provider  °  Answer:   CRENSHAW, BRIAN S [[id number]]  ° ° °Disposition:  Follow up in 2 week(s) post cath. ° °Signed, °Azora Bonzo, PA-C  °10/11/2018 4:19 PM    °[facility] °

## 2018-10-11 ENCOUNTER — Telehealth: Payer: Self-pay | Admitting: *Deleted

## 2018-10-11 ENCOUNTER — Other Ambulatory Visit: Payer: Self-pay

## 2018-10-11 ENCOUNTER — Encounter: Payer: Self-pay | Admitting: Physician Assistant

## 2018-10-11 ENCOUNTER — Telehealth (INDEPENDENT_AMBULATORY_CARE_PROVIDER_SITE_OTHER): Payer: BC Managed Care – PPO | Admitting: Physician Assistant

## 2018-10-11 ENCOUNTER — Encounter: Payer: Self-pay | Admitting: *Deleted

## 2018-10-11 VITALS — Ht 69.0 in | Wt 200.0 lb

## 2018-10-11 DIAGNOSIS — I10 Essential (primary) hypertension: Secondary | ICD-10-CM | POA: Diagnosis not present

## 2018-10-11 DIAGNOSIS — I25119 Atherosclerotic heart disease of native coronary artery with unspecified angina pectoris: Secondary | ICD-10-CM | POA: Diagnosis not present

## 2018-10-11 DIAGNOSIS — Z7189 Other specified counseling: Secondary | ICD-10-CM

## 2018-10-11 DIAGNOSIS — E785 Hyperlipidemia, unspecified: Secondary | ICD-10-CM

## 2018-10-11 DIAGNOSIS — E119 Type 2 diabetes mellitus without complications: Secondary | ICD-10-CM | POA: Diagnosis not present

## 2018-10-11 MED ORDER — NITROGLYCERIN 0.4 MG SL SUBL: 0.4000 mg | SUBLINGUAL_TABLET | SUBLINGUAL | 11 refills | Status: DC | PRN

## 2018-10-11 MED ORDER — METOPROLOL SUCCINATE ER 25 MG PO TB24: 25.0000 mg | ORAL_TABLET | Freq: Every day | ORAL | 11 refills | Status: DC

## 2018-10-11 MED ORDER — ASPIRIN 81 MG PO TABS: 81.0000 mg | ORAL_TABLET | Freq: Every day | ORAL | Status: AC

## 2018-10-11 NOTE — Patient Instructions (Signed)
Medication Instructions:  Start taking:  Aspirin 81 mg once daily (you can get this over the counter)  Metoprolol succinate (Toprol-XL) 25 mg once daily (a prescription has been sent to your pharmacy)  Nitroglycerin 0.4 mg - put one tablet under your tongue as needed for chest pain (a prescription was sent to your pharmacy).  If you need a refill on your cardiac medications before your next appointment, please call your pharmacy.   Lab work:  Labs will be drawn the day of your Cardiac Catheterization (BMET, CBC)  If you have labs (blood work) drawn today and your tests are completely normal, you will receive your results only by: Marland Kitchen MyChart Message (if you have MyChart) OR . A paper copy in the mail If you have any lab test that is abnormal or we need to change your treatment, we will call you to review the results.  Testing/Procedures:  You will be set up for a Cardiac Catheterization - You will get a phone call to schedule the procedure and to go over instructions.  Your physician has requested that you have a cardiac catheterization. Cardiac catheterization is used to diagnose and/or treat various heart conditions. Doctors may recommend this procedure for a number of different reasons. The most common reason is to evaluate chest pain. Chest pain can be a symptom of coronary artery disease (CAD), and cardiac catheterization can show whether plaque is narrowing or blocking your heart's arteries. This procedure is also used to evaluate the valves, as well as measure the blood flow and oxygen levels in different parts of your heart. For further information please visit [email or web address] Please follow instruction sheet, as given.   Follow-Up: At [facility], you and your health needs are our priority.  As part of our continuing mission to provide you with exceptional heart care, we have created designated Provider Care Teams.  These Care Teams include your primary Cardiologist  (physician) and Advanced Practice Providers (APPs -  Physician Assistants and Nurse Practitioners) who all work together to provide you with the care you need, when you need it. You will need a follow up appointment in:  2 weeks.  Please call our office 2 months in advance to schedule this appointment.  You may see Tonny Bollman, MD or Tereso Newcomer, PA-C   Any Other Special Instructions Will Be Listed Below (If Applicable).   If you experience worsening chest pain or shortness of breath, call 911.

## 2018-10-11 NOTE — Telephone Encounter (Signed)
Pt contacted pre-catheterization scheduled at [facility] for: Friday Oct 14, 2018 7:30 AM Verified arrival time and place: [facility] Main Entrance A at: 5:30 AM  No solid food after midnight prior to cath, clear liquids until 5 AM day of procedure. Contrast allergy: no  Hold: Januvia-PM prior to procedure.  AM meds can be  taken pre-cath with sip of water including: ASA 81 mg   Confirmed patient has responsible person to drive home post procedure and observe 24 hours after arriving home: yes   Pt advised due to Covid-19 pandemic, [facility] is restricting visitors and only patients should present for check-in prior to their procedure. People will not be allowed to enter [facility] with the patient. At this time [facility] is not allowing visitors to all [facility] campuses.       _____________   COVID-19 Pre-Screening Questions:  . Do you currently have a fever? no . Have you recently travelled on a cruise, internationally, or to [place], [place], [place], [place], [place], or [place], [place] [employer]) ? no . Have you been in contact with someone that is currently pending confirmation of Covid19 testing or has been confirmed to have the Covid19 virus? no  . Are you currently experiencing fatigue or cough? No . Are you currently experiencing new or worsening shortness of breath at rest or with minimal activity? no . Have you been in contact with someone that was recently sick with fever/cough/fatigue? no      I reviewed procedure instructions, visitor restrictions, and Covid-19 screening questions with patient.

## 2018-10-14 ENCOUNTER — Other Ambulatory Visit (HOSPITAL_COMMUNITY): Payer: BC Managed Care – PPO

## 2018-10-14 ENCOUNTER — Other Ambulatory Visit: Payer: Self-pay

## 2018-10-14 ENCOUNTER — Encounter (HOSPITAL_COMMUNITY)
Admission: RE | Disposition: A | Discharge status: Home / Self Care | Payer: Self-pay | Source: Home / Self Care | Attending: Cardiothoracic Surgery

## 2018-10-14 ENCOUNTER — Inpatient Hospital Stay (HOSPITAL_COMMUNITY)
Admission: RE | Admit: 2018-10-14 | Discharge: 2018-10-26 | DRG: 234 | Disposition: A | Discharge status: Home / Self Care | Payer: BC Managed Care – PPO | Attending: Cardiothoracic Surgery | Admitting: Cardiothoracic Surgery

## 2018-10-14 ENCOUNTER — Encounter (HOSPITAL_COMMUNITY): Payer: Self-pay | Admitting: Interventional Cardiology

## 2018-10-14 DIAGNOSIS — Z87442 Personal history of urinary calculi: Secondary | ICD-10-CM | POA: Diagnosis not present

## 2018-10-14 DIAGNOSIS — I10 Essential (primary) hypertension: Secondary | ICD-10-CM | POA: Diagnosis present

## 2018-10-14 DIAGNOSIS — R000 Tachycardia, unspecified: Secondary | ICD-10-CM | POA: Diagnosis not present

## 2018-10-14 DIAGNOSIS — Z885 Allergy status to narcotic agent status: Secondary | ICD-10-CM

## 2018-10-14 DIAGNOSIS — J90 Pleural effusion, not elsewhere classified: Secondary | ICD-10-CM

## 2018-10-14 DIAGNOSIS — Z7984 Long term (current) use of oral hypoglycemic drugs: Secondary | ICD-10-CM | POA: Diagnosis not present

## 2018-10-14 DIAGNOSIS — Z01810 Encounter for preprocedural cardiovascular examination: Secondary | ICD-10-CM | POA: Diagnosis not present

## 2018-10-14 DIAGNOSIS — I25119 Atherosclerotic heart disease of native coronary artery with unspecified angina pectoris: Secondary | ICD-10-CM

## 2018-10-14 DIAGNOSIS — Z01818 Encounter for other preprocedural examination: Secondary | ICD-10-CM

## 2018-10-14 DIAGNOSIS — Z9689 Presence of other specified functional implants: Secondary | ICD-10-CM

## 2018-10-14 DIAGNOSIS — E119 Type 2 diabetes mellitus without complications: Secondary | ICD-10-CM | POA: Diagnosis not present

## 2018-10-14 DIAGNOSIS — I2510 Atherosclerotic heart disease of native coronary artery without angina pectoris: Secondary | ICD-10-CM | POA: Diagnosis present

## 2018-10-14 DIAGNOSIS — R079 Chest pain, unspecified: Secondary | ICD-10-CM | POA: Diagnosis present

## 2018-10-14 DIAGNOSIS — Z888 Allergy status to other drugs, medicaments and biological substances status: Secondary | ICD-10-CM

## 2018-10-14 DIAGNOSIS — Z1159 Encounter for screening for other viral diseases: Secondary | ICD-10-CM

## 2018-10-14 DIAGNOSIS — Z7982 Long term (current) use of aspirin: Secondary | ICD-10-CM

## 2018-10-14 DIAGNOSIS — G4733 Obstructive sleep apnea (adult) (pediatric): Secondary | ICD-10-CM | POA: Diagnosis present

## 2018-10-14 DIAGNOSIS — E782 Mixed hyperlipidemia: Secondary | ICD-10-CM | POA: Diagnosis present

## 2018-10-14 DIAGNOSIS — I200 Unstable angina: Secondary | ICD-10-CM | POA: Diagnosis present

## 2018-10-14 DIAGNOSIS — J942 Hemothorax: Secondary | ICD-10-CM

## 2018-10-14 DIAGNOSIS — Z951 Presence of aortocoronary bypass graft: Secondary | ICD-10-CM

## 2018-10-14 DIAGNOSIS — I25110 Atherosclerotic heart disease of native coronary artery with unstable angina pectoris: Secondary | ICD-10-CM | POA: Diagnosis not present

## 2018-10-14 DIAGNOSIS — N400 Enlarged prostate without lower urinary tract symptoms: Secondary | ICD-10-CM | POA: Diagnosis present

## 2018-10-14 DIAGNOSIS — R072 Precordial pain: Secondary | ICD-10-CM | POA: Diagnosis present

## 2018-10-14 DIAGNOSIS — I493 Ventricular premature depolarization: Secondary | ICD-10-CM | POA: Diagnosis not present

## 2018-10-14 DIAGNOSIS — K219 Gastro-esophageal reflux disease without esophagitis: Secondary | ICD-10-CM | POA: Diagnosis present

## 2018-10-14 DIAGNOSIS — Z8249 Family history of ischemic heart disease and other diseases of the circulatory system: Secondary | ICD-10-CM | POA: Diagnosis not present

## 2018-10-14 DIAGNOSIS — N179 Acute kidney failure, unspecified: Secondary | ICD-10-CM | POA: Diagnosis not present

## 2018-10-14 DIAGNOSIS — Z79899 Other long term (current) drug therapy: Secondary | ICD-10-CM | POA: Diagnosis not present

## 2018-10-14 DIAGNOSIS — D62 Acute posthemorrhagic anemia: Secondary | ICD-10-CM | POA: Diagnosis not present

## 2018-10-14 DIAGNOSIS — R404 Transient alteration of awareness: Secondary | ICD-10-CM | POA: Diagnosis present

## 2018-10-14 DIAGNOSIS — E8779 Other fluid overload: Secondary | ICD-10-CM | POA: Diagnosis not present

## 2018-10-14 DIAGNOSIS — D696 Thrombocytopenia, unspecified: Secondary | ICD-10-CM | POA: Diagnosis present

## 2018-10-14 HISTORY — DX: Essential (primary) hypertension: I10

## 2018-10-14 HISTORY — DX: Hyperlipidemia, unspecified: E78.5

## 2018-10-14 HISTORY — PX: LEFT HEART CATH AND CORONARY ANGIOGRAPHY: CATH118249

## 2018-10-14 LAB — BASIC METABOLIC PANEL
Anion gap: 16 — ABNORMAL HIGH (ref 5–15)
BUN: 18 mg/dL (ref 6–20)
CO2: 17 mmol/L — ABNORMAL LOW (ref 22–32)
Calcium: 8.9 mg/dL (ref 8.9–10.3)
Chloride: 106 mmol/L (ref 98–111)
Creatinine, Ser: 1.06 mg/dL (ref 0.61–1.24)
GFR calc Af Amer: >60 mL/min (ref >60)
GFR calc non Af Amer: >60 mL/min (ref >60)
Glucose, Bld: 94 mg/dL (ref 70–99)
Potassium: 4.3 mmol/L (ref 3.5–5.1)
Sodium: 139 mmol/L (ref 135–145)

## 2018-10-14 LAB — POCT I-STAT 7, (LYTES, BLD GAS, ICA,H+H)
Acid-base deficit: 3 mmol/L — ABNORMAL HIGH (ref 0.0–2.0)
Bicarbonate: 22.7 mmol/L (ref 20.0–28.0)
Calcium, Ion: 1.21 mmol/L (ref 1.15–1.40)
HCT: 39 % (ref 39.0–52.0)
Hemoglobin: 13.3 g/dL (ref 13.0–17.0)
O2 Saturation: 96 %
Potassium: 4.1 mmol/L (ref 3.5–5.1)
Sodium: 139 mmol/L (ref 135–145)
TCO2: 24 mmol/L (ref 22–32)
pCO2 arterial: 41.1 mmHg (ref 32.0–48.0)
pH, Arterial: 7.35 (ref 7.350–7.450)
pO2, Arterial: 86 mmHg (ref 83.0–108.0)

## 2018-10-14 LAB — URINALYSIS, ROUTINE W REFLEX MICROSCOPIC
Bilirubin Urine: NEGATIVE
Glucose, UA: NEGATIVE mg/dL
Hgb urine dipstick: NEGATIVE
Ketones, ur: NEGATIVE mg/dL
Leukocytes,Ua: NEGATIVE
Nitrite: NEGATIVE
Protein, ur: NEGATIVE mg/dL
Specific Gravity, Urine: 1.028 (ref 1.005–1.030)
pH: 6 (ref 5.0–8.0)

## 2018-10-14 LAB — CBC
HCT: 45.1 % (ref 39.0–52.0)
Hemoglobin: 14.3 g/dL (ref 13.0–17.0)
MCH: 27.4 pg (ref 26.0–34.0)
MCHC: 31.7 g/dL (ref 30.0–36.0)
MCV: 86.4 fL (ref 80.0–100.0)
Platelets: 217 10*3/uL (ref 150–400)
RBC: 5.22 MIL/uL (ref 4.22–5.81)
RDW: 13.1 % (ref 11.5–15.5)
WBC: 6.4 10*3/uL (ref 4.0–10.5)
nRBC: 0 % (ref 0.0–0.2)

## 2018-10-14 LAB — GLUCOSE, CAPILLARY
Glucose-Capillary: 104 mg/dL — ABNORMAL HIGH (ref 70–99)
Glucose-Capillary: 98 mg/dL (ref 70–99)
Glucose-Capillary: 152 mg/dL — ABNORMAL HIGH (ref 70–99)
Glucose-Capillary: 80 mg/dL (ref 70–99)

## 2018-10-14 LAB — POCT I-STAT CREATININE: Creatinine, Ser: 0.9 mg/dL (ref 0.61–1.24)

## 2018-10-14 SURGERY — LEFT HEART CATH AND CORONARY ANGIOGRAPHY
Anesthesia: LOCAL

## 2018-10-14 MED ORDER — LISINOPRIL 10 MG PO TABS
10.0000 mg | ORAL_TABLET | Freq: Every day | ORAL | Status: DC
  Filled 2018-10-14: qty 1

## 2018-10-14 MED ORDER — HEPARIN (PORCINE) IN NACL 1000-0.9 UT/500ML-% IV SOLN
INTRAVENOUS | Status: AC
  Filled 2018-10-14: qty 500

## 2018-10-14 MED ORDER — ACETAMINOPHEN 500 MG PO TABS: 1000.0000 mg | ORAL_TABLET | Freq: Every day | ORAL | Status: DC | PRN

## 2018-10-14 MED ORDER — SODIUM CHLORIDE 0.9% FLUSH: 3.0000 mL | INTRAVENOUS | Status: DC | PRN

## 2018-10-14 MED ORDER — SODIUM CHLORIDE 0.9 % WEIGHT BASED INFUSION
3.0000 mL/kg/h | INTRAVENOUS | Status: DC
  Administered 2018-10-14: 3 mL/kg/h via INTRAVENOUS

## 2018-10-14 MED ORDER — MIDAZOLAM HCL 2 MG/2ML IJ SOLN
INTRAMUSCULAR | Status: DC | PRN
  Administered 2018-10-14 (×2): 1 mg via INTRAVENOUS

## 2018-10-14 MED ORDER — ASPIRIN 81 MG PO CHEW
81.0000 mg | CHEWABLE_TABLET | Freq: Every day | ORAL | Status: DC
  Administered 2018-10-15 – 2018-10-16 (×2): 81 mg via ORAL
  Filled 2018-10-14 (×2): qty 1

## 2018-10-14 MED ORDER — VERAPAMIL HCL 2.5 MG/ML IV SOLN
INTRAVENOUS | Status: AC
  Filled 2018-10-14: qty 2

## 2018-10-14 MED ORDER — ATORVASTATIN CALCIUM 40 MG PO TABS
40.0000 mg | ORAL_TABLET | Freq: Every day | ORAL | Status: DC
  Administered 2018-10-14 – 2018-10-25 (×12): 40 mg via ORAL
  Filled 2018-10-14 (×12): qty 1

## 2018-10-14 MED ORDER — SODIUM CHLORIDE 0.9% FLUSH
3.0000 mL | Freq: Two times a day (BID) | INTRAVENOUS | Status: DC
  Administered 2018-10-16 (×2): 3 mL via INTRAVENOUS

## 2018-10-14 MED ORDER — HYDRALAZINE HCL 20 MG/ML IJ SOLN: 10.0000 mg | INTRAMUSCULAR | Status: AC | PRN

## 2018-10-14 MED ORDER — METOPROLOL TARTRATE 12.5 MG HALF TABLET
12.5000 mg | ORAL_TABLET | Freq: Two times a day (BID) | ORAL | Status: DC
  Administered 2018-10-14 – 2018-10-16 (×6): 12.5 mg via ORAL
  Filled 2018-10-14 (×6): qty 1

## 2018-10-14 MED ORDER — ONDANSETRON HCL 4 MG/2ML IJ SOLN: 4.0000 mg | Freq: Four times a day (QID) | INTRAMUSCULAR | Status: DC | PRN

## 2018-10-14 MED ORDER — NITROGLYCERIN IN D5W 200-5 MCG/ML-% IV SOLN
10.0000 ug/min | INTRAVENOUS | Status: DC
  Administered 2018-10-17: 16.6 ug/min via INTRAVENOUS

## 2018-10-14 MED ORDER — SODIUM CHLORIDE 0.9 % WEIGHT BASED INFUSION: 1.0000 mL/kg/h | INTRAVENOUS | Status: DC

## 2018-10-14 MED ORDER — METOPROLOL SUCCINATE ER 25 MG PO TB24: 25.0000 mg | ORAL_TABLET | Freq: Every day | ORAL | Status: DC

## 2018-10-14 MED ORDER — LINAGLIPTIN 5 MG PO TABS
5.0000 mg | ORAL_TABLET | Freq: Every day | ORAL | Status: DC
  Administered 2018-10-14 – 2018-10-16 (×3): 5 mg via ORAL
  Filled 2018-10-14 (×3): qty 1

## 2018-10-14 MED ORDER — HEPARIN SODIUM (PORCINE) 1000 UNIT/ML IJ SOLN
INTRAMUSCULAR | Status: DC | PRN
  Administered 2018-10-14: 4500 [IU] via INTRAVENOUS

## 2018-10-14 MED ORDER — SODIUM CHLORIDE 0.9% FLUSH
10.0000 mL | Freq: Two times a day (BID) | INTRAVENOUS | Status: DC
  Administered 2018-10-14 – 2018-10-16 (×5): 10 mL

## 2018-10-14 MED ORDER — NITROGLYCERIN 0.4 MG SL SUBL: 0.4000 mg | SUBLINGUAL_TABLET | SUBLINGUAL | Status: DC | PRN

## 2018-10-14 MED ORDER — ASPIRIN 81 MG PO CHEW: 81.0000 mg | CHEWABLE_TABLET | ORAL | Status: DC

## 2018-10-14 MED ORDER — ACETAMINOPHEN 325 MG PO TABS: 650.0000 mg | ORAL_TABLET | ORAL | Status: DC | PRN

## 2018-10-14 MED ORDER — SODIUM CHLORIDE 0.9% FLUSH
10.0000 mL | INTRAVENOUS | Status: DC | PRN
  Administered 2018-10-16: 05:00:00 20 mL
  Filled 2018-10-14: qty 40

## 2018-10-14 MED ORDER — LABETALOL HCL 5 MG/ML IV SOLN: 10.0000 mg | INTRAVENOUS | Status: AC | PRN

## 2018-10-14 MED ORDER — LIDOCAINE HCL (PF) 1 % IJ SOLN
INTRAMUSCULAR | Status: DC | PRN
  Administered 2018-10-14: 2 mL

## 2018-10-14 MED ORDER — ENOXAPARIN SODIUM 100 MG/ML ~~LOC~~ SOLN
90.0000 mg | Freq: Two times a day (BID) | SUBCUTANEOUS | Status: DC
  Administered 2018-10-14: 90 mg via SUBCUTANEOUS
  Filled 2018-10-14 (×2): qty 1

## 2018-10-14 MED ORDER — SODIUM CHLORIDE 0.9 % WEIGHT BASED INFUSION: 1.0000 mL/kg/h | INTRAVENOUS | Status: AC

## 2018-10-14 MED ORDER — TAMSULOSIN HCL 0.4 MG PO CAPS
0.4000 mg | ORAL_CAPSULE | Freq: Every day | ORAL | Status: DC
  Administered 2018-10-14 – 2018-10-25 (×12): 0.4 mg via ORAL
  Filled 2018-10-14 (×12): qty 1

## 2018-10-14 MED ORDER — LIDOCAINE HCL (PF) 1 % IJ SOLN
INTRAMUSCULAR | Status: AC
  Filled 2018-10-14: qty 30

## 2018-10-14 MED ORDER — MIDAZOLAM HCL 2 MG/2ML IJ SOLN
INTRAMUSCULAR | Status: AC
  Filled 2018-10-14: qty 2

## 2018-10-14 MED ORDER — HEPARIN (PORCINE) IN NACL 1000-0.9 UT/500ML-% IV SOLN
INTRAVENOUS | Status: DC | PRN
  Administered 2018-10-14 (×3): 500 mL

## 2018-10-14 MED ORDER — SODIUM CHLORIDE 0.9 % IV SOLN: 250.0000 mL | INTRAVENOUS | Status: DC | PRN

## 2018-10-14 MED ORDER — HEPARIN (PORCINE) IN NACL 1000-0.9 UT/500ML-% IV SOLN
INTRAVENOUS | Status: AC
  Filled 2018-10-14: qty 1000

## 2018-10-14 MED ORDER — VERAPAMIL HCL 2.5 MG/ML IV SOLN
INTRAVENOUS | Status: DC | PRN
  Administered 2018-10-14: 10 mL via INTRA_ARTERIAL

## 2018-10-14 MED ORDER — IOHEXOL 350 MG/ML SOLN
INTRAVENOUS | Status: DC | PRN
  Administered 2018-10-14: 90 mL via INTRA_ARTERIAL

## 2018-10-14 SURGICAL SUPPLY — 12 items
CATH INFINITI 5 FR JL3.5 (CATHETERS) ×2 IMPLANT
CATH INFINITI JR4 5F (CATHETERS) ×2 IMPLANT
COVER DOME SNAP 22 D (MISCELLANEOUS) ×2 IMPLANT
DEVICE RAD COMP TR BAND LRG (VASCULAR PRODUCTS) ×2 IMPLANT
GLIDESHEATH SLEND A-KIT 6F 22G (SHEATH) ×2 IMPLANT
GUIDEWIRE INQWIRE 1.5J.035X260 (WIRE) ×1 IMPLANT
INQWIRE 1.5J .035X260CM (WIRE) ×2
KIT HEART LEFT (KITS) ×2 IMPLANT
PACK CARDIAC CATHETERIZATION (CUSTOM PROCEDURE TRAY) ×2 IMPLANT
SHEATH PROBE COVER 6X72 (BAG) ×2 IMPLANT
TRANSDUCER W/STOPCOCK (MISCELLANEOUS) ×2 IMPLANT
TUBING CIL FLEX 10 FLL-RA (TUBING) ×2 IMPLANT

## 2018-10-14 NOTE — Consult Note (Addendum)
[address]       [place] [address]             [phone]        Joron Velis [facility] Medical Record #[id number] Date of Birth: 05/02/1971  Referring:  Dr Verdis Prime Primary Care: Ileana Ladd, MD Primary Cardiologist:Michael Excell Seltzer, MD  Chief Complaint: Chest pain  History of Present Illness:      Mr. Bures is a very pleasant 48 yo AA male, with known history of hypertension, hyperlipidemia, GERD, and inguinal hernia repair.  He was evaluated by Dr. Excell Seltzer in April after he presented with complaints of chest pain.  Cardiac enzymes were negative at that time.  He also underwent Echocardiogram which showed a preserved EF with no motion abnormalities of valvular abnormalities.  It was felt his symptoms were likely musculoskeletal pain.  However, due to his risk factors he was set up for CTA which showed significant disease in the LAD, D1 and D2.  The scan was reviewed by Dr. Excell Seltzer who felt patient needed to undergo cardiac catheterization.  This was performed today and showed significant 3 vessel CAD.  Due to the extent of his disease and locations it was not felt PCI would be appropriate and coronary bypass grafting is being requested.  Currently the patient is chest pain free.  He states that he has been experiencing left arm and neck symptoms for the past 5 years.  Over the past month he has been having increasing episodes of substernal chest pain with exertion and at rest.  He notes increasing shortness of breath with minimal activity as he is walking around at work.   He states the pain typically occurs across his left chest and radiates into his neck.  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  He denies  N/V, and lower extremity swelling.  He does not smoke.  He does have known diabetes on oral agents previously had been on metformin but says his stomach would not tolerate this.    Current Activity/ Functional Status: Patient is independent with mobility/ambulation, transfers, ADL's, IADL's.   Zubrod Score: At the time of surgery this patients most appropriate activity status/level should be described as:     0    Normal activity, no symptoms     1    Restricted in physical strenuous activity but ambulatory, able to do out light work     2    Ambulatory and capable of self care, unable to do work activities, up and about                 more than 50%  Of the time                                3    Only limited self care, in bed greater than 50% of waking hours     4    Completely disabled, no self care, confined to bed or chair     5    Moribund  Past Medical History:  Diagnosis Date   Complication of anesthesia    Pt verble and "prophecizes" after anesthesia   Coronary Artery Disease    Coronary CTA 09/2018: pLAD 70-90; pD1+pD2 70-90; mRCA mixed plaque - cannot rule out 70-90; FFR suggests hemodynamically significant stenosis  Diabetes mellitus without complication (HCC)    Echocardiogram 09/2018    Echo 09/2018: EF 60-65   GERD (gastroesophageal reflux disease)    Inguinal hernia 2016   right   Kidney stones    Vision abnormalities     Past Surgical History:  Procedure Laterality Date   HAND SURGERY Left 1987   INGUINAL HERNIA REPAIR Bilateral 05/27/2015   Procedure: LAPAROSCOPIC BILATERAL  INGUINAL HERNIA REPAIRS WITH MESH;  Surgeon: Karie SodaSteven Gross, MD;  Location: [facility];  Service: General;  Laterality: Bilateral;   KNEE SURGERY      Social History   Tobacco Use  Smoking Status Never Smoker  Smokeless Tobacco Never Used    Social History   Substance and Sexual Activity  Alcohol Use No     Allergies  Allergen Reactions   Antihistamines, Diphenhydramine-Type Hives and Other (See Comments)    Seizures   Demerol Hives   Morphine And Related Hives    Esp with high dose morphine, possibly Dilaudid Tolerates  hydrocodone, oxycodone    Current Facility-Administered Medications  Medication Dose Route Frequency Provider Last Rate Last Dose   0.9 %  sodium chloride infusion  250 mL Intravenous PRN Lyn RecordsSmith, Henry W, MD       0.9% sodium chloride infusion  1 mL/kg/hr Intravenous Continuous Lyn RecordsSmith, Henry W, MD 90.7 mL/hr at 10/14/18 0900 1 mL/kg/hr at 10/14/18 0900   acetaminophen (TYLENOL) tablet 1,000 mg  1,000 mg Oral Daily PRN Lyn RecordsSmith, Henry W, MD       acetaminophen (TYLENOL) tablet 650 mg  650 mg Oral Q4H PRN Lyn RecordsSmith, Henry W, MD       aspirin tablet 81 mg  81 mg Oral Daily Lyn RecordsSmith, Henry W, MD       atorvastatin (LIPITOR) tablet 40 mg  40 mg Oral QHS Lyn RecordsSmith, Henry W, MD       hydrALAZINE (APRESOLINE) injection 10 mg  10 mg Intravenous Q20 Min PRN Lyn RecordsSmith, Henry W, MD       labetalol (NORMODYNE) injection 10 mg  10 mg Intravenous Q10 min PRN Lyn RecordsSmith, Henry W, MD       linagliptin (TRADJENTA) tablet 5 mg  5 mg Oral Daily Lyn RecordsSmith, Henry W, MD       lisinopril (ZESTRIL) tablet 10 mg  10 mg Oral Daily Lyn RecordsSmith, Henry W, MD       metoprolol succinate (TOPROL-XL) 24 hr tablet 25 mg  25 mg Oral Daily Lyn RecordsSmith, Henry W, MD       metoprolol tartrate (LOPRESSOR) tablet 12.5 mg  12.5 mg Oral BID Lyn RecordsSmith, Henry W, MD       nitroGLYCERIN (NITROSTAT) SL tablet 0.4 mg  0.4 mg Sublingual Q5 min PRN Lyn RecordsSmith, Henry W, MD       nitroGLYCERIN 50 mg in dextrose 5 % 250 mL (0.2 mg/mL) infusion  10 mcg/min Intravenous Titrated Lyn RecordsSmith, Henry W, MD       ondansetron [facility](ZOFRAN) injection 4 mg  4 mg Intravenous Q6H PRN Lyn RecordsSmith, Henry W, MD       sodium chloride flush (NS) 0.9 % injection 3 mL  3 mL Intravenous Q12H Lyn RecordsSmith, Henry W, MD       sodium chloride flush (NS) 0.9 % injection 3 mL  3 mL Intravenous PRN Lyn RecordsSmith, Henry W, MD       tamsulosin [facility](FLOMAX) capsule 0.4 mg  0.4 mg Oral QHS Lyn RecordsSmith, Henry W, MD        Medications Prior to Admission  Medication Sig Dispense Refill Last Dose  acetaminophen (TYLENOL) 500 MG tablet Take 1,000 mg  by mouth daily as needed for moderate pain or headache.   Past Week at Unknown time   aspirin 81 MG tablet Take 1 tablet (81 mg total) by mouth daily.   10/14/2018 at 0430   atorvastatin (LIPITOR) 40 MG tablet Take 40 mg by mouth at bedtime.    10/13/2018 at Unknown time   lisinopril (PRINIVIL,ZESTRIL) 10 MG tablet Take 1 tablet (10 mg total) by mouth daily for 30 days. 30 tablet 0 10/14/2018 at 0430   metoprolol succinate (TOPROL XL) 25 MG 24 hr tablet Take 1 tablet (25 mg total) by mouth daily. 30 tablet 11 10/14/2018 at 0430   sitaGLIPtin (JANUVIA) 100 MG tablet Take 1 tablet (100 mg total) by mouth at bedtime for 30 days. 30 tablet 0 Past Week at Unknown time   tamsulosin (FLOMAX) 0.4 MG CAPS capsule Take 1 capsule (0.4 mg total) by mouth at bedtime for 30 days. 30 capsule 0 10/13/2018 at Unknown time   nitroGLYCERIN (NITROSTAT) 0.4 MG SL tablet Place 1 tablet (0.4 mg total) under the tongue every 5 (five) minutes as needed for chest pain. 25 tablet 11 Unknown at Unknown time    Family History  Problem Relation Age of Onset   Hypertension Mother    Cancer Father    Heart failure Father    Sudden death Brother    Heart attack Brother    Hypertension Sister    Heart murmur Sister    Patient has a family history significant for a brother who died cardiac arrest while at the grocery store at age 47, this was about 10 years ago, no definitive etiology was known  Review of Systems:   ROS Constitutional: negative Ears, nose, mouth, throat, and face: negative Respiratory: negative for dyspnea on exertion Cardiovascular: positive for chest pain, chest pressure/discomfort and exertional chest pressure/discomfort Gastrointestinal: negative for nausea and vomiting Musculoskeletal:negative Neurological: negative     Cardiac Review of Systems: Y or  [    ]= no  Chest Pain [ Y   ]  Resting SOB [ N  ] Exertional SOB  [N  ]  Orthopnea [  ]   Pedal Edema [ N  ]    Palpitations Klaus.Mock  ] Syncope   [  ]   Presyncope [   ]  General Review of Systems: [Y] = yes [  ]=no Constitional: recent weight change [  ]; anorexia [  ]; fatigue [ Y ]; nausea [ N ]; night sweats [  ]; fever [  ]; or chills [  ]                                                               Dental: Last Dentist visit:   Eye : blurred vision [  ]; diplopia [   ]; vision changes [  ];  Amaurosis fugax[  ]; Resp: cough Klaus.Mock  ];  wheezing[  ];  hemoptysis[  ]; shortness of breath[N  ]; paroxysmal nocturnal dyspnea[  ]; dyspnea on exertion[  ]; or orthopnea[  ];  GI:  gallstones[  ], vomiting[  ];  dysphagia[  ]; melena[  ];  hematochezia [  ]; heartburn[  ];   Hx of  Colonoscopy[  ];  GU: kidney stones [  ]; hematuria[  ];   dysuria [  ];  nocturia[  ];  history of     obstruction [  ]; urinary frequency [  ]             Skin: rash, swelling[N  ];, hair loss[  ];  peripheral edema[ N ];  or itching[  ]; Musculosketetal: myalgias[  ];  joint swelling[  ];  joint erythema[  ];  joint pain[  ];  back pain[  ];  Heme/Lymph: bruising[  ];  bleeding[  ];  anemia[  ];  Neuro: TIA[  ];  headaches[  ];  stroke[N  ];  vertigo[  ];  seizures[  ];   paresthesias[  ];  difficulty walking[  ];  Psych:depression[  ]; anxiety[  ];  Endocrine: diabetes[Y  ];  thyroid dysfunction[  ];  Physical Exam: BP 140/67 (BP Location: Left Arm)    Pulse 84    Temp 98.6 F (37 C) (Oral)    Resp 15    Ht  (1.753 m)    Wt 90.7 kg    SpO2 100%    BMI 29.53 kg/m   General appearance: alert, cooperative and no distress Head: Normocephalic, without obvious abnormality, atraumatic Neck: no adenopathy, no carotid bruit, no JVD, supple, symmetrical, trachea midline and thyroid not enlarged, symmetric, no tenderness/mass/nodules Resp: clear to auscultation bilaterally Cardio: regular rate and rhythm GI: soft, non-tender; bowel sounds normal; no masses,  no organomegaly Extremities: extremities normal, atraumatic, no cyanosis or edema Neurologic: Grossly  normal Patient is right-handed, negative Allen test on the left  Diagnostic Studies & Laboratory data:     Recent Radiology Findings:  Dg Chest 2 View  Result Date: 09/18/2018 CLINICAL DATA:  Chest pain, dizziness. EXAM: CHEST - 2 VIEW COMPARISON:  Chest x-ray dated 08/12/2017. FINDINGS: Cardiomediastinal silhouette is within normal limits in size and configuration. Lungs are clear. Lung volumes are normal. No evidence of pneumonia. No pleural effusion. No pneumothorax seen. Osseous structures about the chest are unremarkable. IMPRESSION: No active cardiopulmonary disease. No evidence of pneumonia or pulmonary edema. Electronically Signed   By: Bary Richard M.D.   On: 09/18/2018 18:08   Ct Coronary Morph W/cta Cor W/score W/ca W/cm &/or Wo/cm  Addendum Date: 10/06/2018   ADDENDUM REPORT: 10/06/2018 16:20 CLINICAL DATA:  Chest pain EXAM: Cardiac CTA MEDICATIONS: Sub lingual nitro.  x 2 and lopressor mg TECHNIQUE: The patient was scanned on a Siemens 192 slice scanner. Gantry rotation speed was 250 msecs. Collimation was 0.6 mm. A 100 kV prospective scan was triggered in the ascending thoracic aorta at 35-75% of the R-R interval. Average HR during the scan was bpm. The 3D data set was interpreted on a dedicated work station using MPR, MIP and VRT modes. A total of 80cc of contrast was used. FINDINGS: Non-cardiac: See separate report from [facility]. Pulmonary veins drain normally to the left atrium. Calcium Score: Coronary artery calcium score 448 Agatston units. Coronary Arteries: Right dominant with no anomalies LM: No plaque or stenosis. LAD system: Extensive mixed plaque with severe (70-90%) stenosis in the proximal LAD. D1 and D2 are small to moderate vessels and appear to have significant mixed plaque proximally (70-90% stenosis). Circumflex system: Relatively small vessel, no plaque or stenosis. RCA system: The mid RCA is obscured by motion artifact in all phases. However, there is  mixed plaque in this area as well. Cannot rule out severe (70-90%) stenosis. IMPRESSION: 1.  Coronary artery calcium score 448 Agatston units. This places the patient in the 98th percentile for age and gender, suggesting high risk of future cardiac events. 2. Suspected severe proximal LAD stenosis. There also appears to be significant disease in small-moderate D1 and D2. 3. Mid RCA is obscured by motion artifact but also has significant plaque. Possible severe (70-90%) stenosis. Will send for FFR. Dalton Mclean Electronically Signed   By: Marca Ancona M.D.   On: 10/06/2018 16:20   Result Date: 10/06/2018 EXAM: OVER-READ INTERPRETATION  CT CHEST The following report is an over-read performed by radiologist Dr. Irish Lack of [facility] on 10/06/2018. This over-read does not include interpretation of cardiac or coronary anatomy or pathology. The coronary CTA interpretation by the cardiologist is attached. COMPARISON:  None. FINDINGS: Vascular: No incidental noncardiac vascular findings. Mediastinum/Nodes: Visualized mediastinum and hilar regions show no lymphadenopathy or masses. Lungs/Pleura: Visualized lungs show no evidence of pulmonary edema, consolidation, pneumothorax, nodule or pleural fluid. Upper Abdomen: No acute abnormality. Musculoskeletal: No chest wall mass or suspicious bone lesions identified. IMPRESSION: No significant incidental findings. Electronically Signed: By: Irish Lack M.D. On: 10/06/2018 10:06   Ct Coronary Fractional Flow Reserve Data Prep  Result Date: 10/07/2018 CLINICAL DATA:  Chest pain EXAM: CT FFR MEDICATIONS: No additional medications. TECHNIQUE: The coronary CTA was sent for FFR. FINDINGS: FFR 0.61 mid D1 FFR 0.73 mid D2 FFR 0.74 mid LAD and <0.5 distal LAD Unable to interpret RCA due to artifact. No obstructive disease in LCx. IMPRESSION: Hemodynamically significant stenosis in the proximal LAD and proximal D1 and D2. Unable to interpret RCA due to  artifact. Suggest cardiac cath. Dalton Mclean Electronically Signed   By: Marca Ancona M.D.   On: 10/07/2018 16:50     I have independently reviewed the above radiologic studies and discussed with the patient    CATH:  Diabetic with family history of premature coronary atherosclerosis.  Atypical and typical symptoms.  Recurring episodes of chest tightness at rest which is mimicked by left and right coronary contrast injections.  Symptoms in arm and neck are more continuous, positional, and not likely ischemic.  Severe two-vessel coronary disease with segmental 90 to 95% proximal to mid LAD, segmental 80% large first diagonal that supplies the distribution of the ramus intermedius or circumflex, segmental 80% stenosis in the moderate-sized second diagonal that is contained within the LAD stenosis as a bifurcation lesion, and segmental mid to distal.  The patient is right dominant.  Circumflex is small.  Normal LVEDP  RECOMMENDATIONS:   In room consultation with [facility] CV surgeon Dr. Sheliah Plane.  After discussion with patient, his wife, and surgeon, I have decided to admit the patient and he will undergo arterial grafting to the LAD, diagonal, and probable saphenous vein grafting to the right coronary on Monday.  Decided against PCI because of the requirement for long stents in all segments and anticipated decreased durability over time given his young age and diabetes.  IV nitroglycerin, therapeutic Lovenox.  Screening COVID testing. Diagnostic  Dominance: Right  Left Anterior Descending  Prox LAD to Mid LAD lesion 95% stenosed  Prox LAD to Mid LAD lesion is 95% stenosed.  First Diagonal Branch  Ost 1st Diag to 1st Diag lesion 80% stenosed  Ost 1st Diag to 1st Diag lesion is 80% stenosed.  Second Diagonal [employer] 2nd Diag to 2nd Diag lesion 80% stenosed  Ost 2nd Diag to 2nd Diag lesion is 80% stenosed.  Left Circumflex  Vessel is small.  First Obtuse Marginal Branch    Vessel is small in size.  Second Obtuse Marginal Branch  Vessel is small in size.  Right Coronary Artery  Mid RCA to Dist RCA lesion 85% stenosed  Mid RCA to Dist RCA lesion is 85% stenosed.    Most Recent Value  AO Systolic Pressure 115 mmHg  AO Diastolic Pressure 77 mmHg  AO Mean 95 mmHg  LV Systolic Pressure 126 mmHg  LV Diastolic Pressure 12 mmHg  LV EDP 16 mmHg  AOp Systolic Pressure 114 mmHg  AOp Diastolic Pressure 71 mmHg  AOp Mean Pressure 90 mmHg  LVp Systolic Pressure 99 mmHg  LVp Diastolic Pressure 7 mmHg  LVp EDP Pressure 13 mmHg       Recent Lab Findings: Lab Results  Component Value Date   WBC 6.4 10/14/2018   HGB 14.3 10/14/2018   HCT 45.1 10/14/2018   PLT 217 10/14/2018   GLUCOSE 115 (H) 09/18/2018   CHOL 198 09/19/2018   TRIG 67 09/19/2018   HDL 46 09/19/2018   LDLCALC 139 (H) 09/19/2018   ALT 20 03/22/2018   AST 24 03/22/2018   NA 138 09/18/2018   K 4.0 09/18/2018   CL 105 09/18/2018   CREATININE 1.08 09/18/2018   BUN 12 09/18/2018   CO2 21 (L) 09/18/2018   TSH 3.900 09/18/2018   INR 1.0 09/18/2018   HGBA1C 6.0 (H) 09/18/2018    Assessment / Plan:      1. CAD, experiencing unstable angina- Cath today shows severe 3 vessel CAD... for CABG 2. DM- well  Managed, A1c is 6.0 3. HTN-on zestril 4. Hyperlipidemia-on lipitor 5. he is currently symptom free from COVID, but will require testing prior to proceeding with surgery.  48 year old diabetic male who is had progressive anginal symptoms for the past month, now having substernal discomfort even at rest, also increasing shortness of breath with minimal exertion.  I have reviewed his cardiac cath films with Dr. Katrinka Blazing with the patient and his wife with complex three-vessel coronary artery disease underlying diabetes the best treatment option because of his unstable anginal symptoms his coronary artery bypass grafting.  The risks and options have been discussed with the patient and his wife in  detail and he is willing to proceed with coronary artery bypass grafting.  I have also discussed with him the possible use of left radial artery, with a specific risks of vascular or neuro injury to the left arm.  The goals risks and alternatives of the planned surgical procedure coronary artery bypass grafting with possible left radial harvest have been discussed with the patient in detail. The risks of the procedure including death, infection, stroke, myocardial infarction, bleeding, blood transfusion have all been discussed specifically.  I have quoted [name] a 2 % of perioperative mortality and a complication rate as high as I called him yesterday 30 %. The patient's questions have been answered.[employer] is willing  to proceed with the planned procedure.

## 2018-10-14 NOTE — Progress Notes (Signed)
Cardiac Rehab Advisory Cardiac Rehab Phase I is not seeing pts face to face at this time due to Covid 19 restrictions. Ambulation is occurring through nursing, PT, and mobility teams. We will help facilitate that process as needed. We are calling pts in their rooms and discussing education. We will then deliver education materials to pts RN for delivery to pt.   Spoke with pt by phone. Discussed sternal precautions, moving in the tube, IS, mobility post op, and d/c planning. Good reception although he is overwhelmed right now. Will bring him materials to begin reading and f/u tomorrow to make sure he does not have questions. His wife and kids will be with him at d/c. [id number] Ethelda Chick CES, ACSM 1:52 PM 10/14/2018

## 2018-10-14 NOTE — Progress Notes (Signed)
ANTICOAGULATION CONSULT NOTE - Initial Consult  Pharmacy Consult for Lovenox Indication: multivessel disease pending CABG  Allergies  Allergen Reactions  . Antihistamines, Diphenhydramine-Type Hives and Other (See Comments)    Seizures  . Demerol Hives  . Morphine And Related Hives    Esp with high dose morphine, possibly Dilaudid Tolerates hydrocodone, oxycodone    Patient Measurements: Height: 5\' 9"  (175.3 cm) Weight: 200 lb (90.7 kg) IBW/kg (Calculated) : 70.7  Vital Signs: Temp: 98.6 F (37 C) (05/08 0918) Temp Source: Oral (05/08 0918) BP: 135/95 (05/08 0952) Pulse Rate: 80 (05/08 0952)  Labs: Recent Labs    10/14/18 0549 10/14/18 0924  HGB 14.3  --   HCT 45.1  --   PLT 217  --   CREATININE  --  1.06    Estimated Creatinine Clearance: 93.8 mL/min (by C-G formula based on SCr of 1.06 mg/dL).   Medical History: Past Medical History:  Diagnosis Date  . Complication of anesthesia    Pt verble and "prophecizes" after anesthesia  . Coronary Artery Disease    Coronary CTA 09/2018: pLAD 70-90; pD1+pD2 70-90; mRCA mixed plaque - cannot rule out 70-90; FFR suggests hemodynamically significant stenosis  . Diabetes mellitus without complication (HCC)   . Echocardiogram 09/2018    Echo 09/2018: EF 60-65  . GERD (gastroesophageal reflux disease)   . Inguinal hernia 2016   right  . Kidney stones   . Vision abnormalities     Medications:  Scheduled:  . aspirin  81 mg Oral Daily  . atorvastatin  40 mg Oral QHS  . linagliptin  5 mg Oral Daily  . [START ON 10/15/2018] lisinopril  10 mg Oral Daily  . metoprolol tartrate  12.5 mg Oral BID  . sodium chloride flush  3 mL Intravenous Q12H  . tamsulosin  0.4 mg Oral QHS   Infusions:  . sodium chloride    . sodium chloride 1 mL/kg/hr (10/14/18 0900)  . nitroGLYCERIN      Assessment: 48 yo M s/p cath 5/8 found to have multivessel disease.  Eval for CABG pending.  Pharmacy asked to start Lovenox treatment dose 8 hours  after sheath removal.  Radial sheath removed this AM at 0830.  Goal of Therapy:  Anti-Xa level 0.6-1 units/ml 4hrs after LMWH dose given Monitor platelets by anticoagulation protocol: Yes   Plan:  Lovenox 90mg  SQ q12h - first dose at 6pm CBC q72 hours Follow-up plans for CABG   [employer], Pharm.D., BCPS Clinical Pharmacist Clinical phone for 10/14/2018 from 8:30-4:00 is [phone].  **Pharmacist phone directory can now be found on [email or web address] ([email or web address]).  Listed under [facility].  10/14/2018 11:09 AM

## 2018-10-14 NOTE — CV Procedure (Signed)
   Diagnostic coronary angiography and left ventricular hemodynamic recordings via right radial using ultrasound guidance.  Severe segmental proximal to mid LAD, severe segmental proximal to mid first diagonal this serves the function of the patient's ramus/circumflex, severe segmental mid to distal segmental disease.  Normal LVEDP.  Previously documented normal LV function.  Contrast injection causes chest pressure that mimics innumerable episodes of discomfort that occur with activity and at rest at home over the past several months.  Recommendation: Admit to hospital; debate surgery versus multivessel stenting (not favored by me as stents will be long and risk of procedure will be higher than usual with likely decreased durability due to length of stents used in each segment that needs therapy).  IV nitroglycerin to give ischemic protection.  Surgical consultation with Dr. Tyrone Sage

## 2018-10-14 NOTE — Interval H&P Note (Signed)
Cath Lab Visit (complete for each Cath Lab visit)  Clinical Evaluation Leading to the Procedure:   ACS: Yes.    Non-ACS:    Anginal Classification: CCS III  Anti-ischemic medical therapy: Minimal Therapy (1 class of medications)  Non-Invasive Test Results: High-risk stress test findings: cardiac mortality >3%/year  Prior CABG: No previous CABG      History and Physical Interval Note:  10/14/2018 7:41 AM  Danny Brewer  has presented today for surgery, with the diagnosis of abnormal ct.  The various methods of treatment have been discussed with the patient and family. After consideration of risks, benefits and other options for treatment, the patient has consented to  Procedure(s): LEFT HEART CATH AND CORONARY ANGIOGRAPHY (N/A) as a surgical intervention.  The patient's history has been reviewed, patient examined, no change in status, stable for surgery.  I have reviewed the patient's chart and labs.  Questions were answered to the patient's satisfaction.     Lyn Records III

## 2018-10-15 ENCOUNTER — Inpatient Hospital Stay (HOSPITAL_COMMUNITY): Payer: BC Managed Care – PPO

## 2018-10-15 ENCOUNTER — Encounter (HOSPITAL_COMMUNITY): Payer: Self-pay | Admitting: Interventional Cardiology

## 2018-10-15 DIAGNOSIS — Z9289 Personal history of other medical treatment: Secondary | ICD-10-CM

## 2018-10-15 DIAGNOSIS — Z01810 Encounter for preprocedural cardiovascular examination: Secondary | ICD-10-CM

## 2018-10-15 DIAGNOSIS — G4733 Obstructive sleep apnea (adult) (pediatric): Secondary | ICD-10-CM | POA: Diagnosis present

## 2018-10-15 HISTORY — DX: Personal history of other medical treatment: Z92.89

## 2018-10-15 LAB — GLUCOSE, CAPILLARY
Glucose-Capillary: 74 mg/dL (ref 70–99)
Glucose-Capillary: 96 mg/dL (ref 70–99)
Glucose-Capillary: 92 mg/dL (ref 70–99)
Glucose-Capillary: 84 mg/dL (ref 70–99)

## 2018-10-15 LAB — NOVEL CORONAVIRUS, NAA (HOSP ORDER, SEND-OUT TO REF LAB; TAT 18-24 HRS): SARS-CoV-2, NAA: NOT DETECTED

## 2018-10-15 MED ORDER — TRANEXAMIC ACID 1000 MG/10ML IV SOLN
1.5000 mg/kg/h | INTRAVENOUS | Status: AC
  Administered 2018-10-17: 1.5 mg/kg/h via INTRAVENOUS
  Filled 2018-10-15: qty 25

## 2018-10-15 MED ORDER — ENOXAPARIN SODIUM 100 MG/ML ~~LOC~~ SOLN
90.0000 mg | Freq: Two times a day (BID) | SUBCUTANEOUS | Status: AC
  Administered 2018-10-15 – 2018-10-16 (×3): 90 mg via SUBCUTANEOUS
  Filled 2018-10-15 (×3): qty 1

## 2018-10-15 MED ORDER — NOREPINEPHRINE 4 MG/250ML-% IV SOLN
0.0000 ug/min | INTRAVENOUS | Status: DC
  Filled 2018-10-15: qty 250

## 2018-10-15 MED ORDER — SODIUM CHLORIDE 0.9 % IV SOLN
INTRAVENOUS | Status: DC
  Filled 2018-10-15: qty 30

## 2018-10-15 MED ORDER — TRANEXAMIC ACID (OHS) PUMP PRIME SOLUTION
2.0000 mg/kg | INTRAVENOUS | Status: DC
  Filled 2018-10-15: qty 1.8

## 2018-10-15 MED ORDER — EPINEPHRINE PF 1 MG/ML IJ SOLN
0.0000 ug/min | INTRAVENOUS | Status: DC
  Filled 2018-10-15: qty 4

## 2018-10-15 MED ORDER — TRANEXAMIC ACID (OHS) BOLUS VIA INFUSION
15.0000 mg/kg | INTRAVENOUS | Status: AC
  Administered 2018-10-17: 1348.5 mg via INTRAVENOUS
  Filled 2018-10-15: qty 1349

## 2018-10-15 MED ORDER — DOPAMINE-DEXTROSE 3.2-5 MG/ML-% IV SOLN
0.0000 ug/kg/min | INTRAVENOUS | Status: DC
  Filled 2018-10-15: qty 250

## 2018-10-15 MED ORDER — PHENYLEPHRINE HCL-NACL 20-0.9 MG/250ML-% IV SOLN
30.0000 ug/min | INTRAVENOUS | Status: AC
  Administered 2018-10-17: 20 ug/min via INTRAVENOUS
  Filled 2018-10-15: qty 250

## 2018-10-15 MED ORDER — DEXMEDETOMIDINE HCL IN NACL 400 MCG/100ML IV SOLN
0.1000 ug/kg/h | INTRAVENOUS | Status: AC
  Administered 2018-10-17: 10:00:00 0.4 ug/kg/h via INTRAVENOUS
  Filled 2018-10-15: qty 100

## 2018-10-15 MED ORDER — SODIUM CHLORIDE 0.9 % IV SOLN
1.5000 g | INTRAVENOUS | Status: AC
  Administered 2018-10-17: 1.5 g via INTRAVENOUS
  Filled 2018-10-15: qty 1.5

## 2018-10-15 MED ORDER — INSULIN REGULAR(HUMAN) IN NACL 100-0.9 UT/100ML-% IV SOLN
INTRAVENOUS | Status: AC
  Administered 2018-10-17: 1.4 [IU]/h via INTRAVENOUS
  Filled 2018-10-15: qty 100

## 2018-10-15 MED ORDER — MAGNESIUM SULFATE 50 % IJ SOLN
40.0000 meq | INTRAMUSCULAR | Status: DC
  Filled 2018-10-15: qty 9.85

## 2018-10-15 MED ORDER — VANCOMYCIN HCL 10 G IV SOLR
1500.0000 mg | INTRAVENOUS | Status: AC
  Administered 2018-10-17: 1500 mg via INTRAVENOUS
  Filled 2018-10-15: qty 1500

## 2018-10-15 MED ORDER — MILRINONE LACTATE IN DEXTROSE 20-5 MG/100ML-% IV SOLN
0.3000 ug/kg/min | INTRAVENOUS | Status: DC
  Filled 2018-10-15: qty 100

## 2018-10-15 MED ORDER — SODIUM CHLORIDE 0.9 % IV SOLN
750.0000 mg | INTRAVENOUS | Status: AC
  Administered 2018-10-17: 750 mg via INTRAVENOUS
  Filled 2018-10-15: qty 750

## 2018-10-15 MED ORDER — POTASSIUM CHLORIDE 2 MEQ/ML IV SOLN
80.0000 meq | INTRAVENOUS | Status: DC
  Filled 2018-10-15: qty 40

## 2018-10-15 MED ORDER — PLASMA-LYTE 148 IV SOLN
INTRAVENOUS | Status: DC
  Filled 2018-10-15: qty 2.5

## 2018-10-15 MED ORDER — NITROGLYCERIN IN D5W 200-5 MCG/ML-% IV SOLN
2.0000 ug/min | INTRAVENOUS | Status: DC
  Filled 2018-10-15: qty 250

## 2018-10-15 NOTE — Plan of Care (Signed)
  Problem: Clinical Measurements: Goal: Cardiovascular complication will be avoided Outcome: Progressing Note:  No s/s of cardiovascular complication noted.  VSS.  NSR on telemetry.  Denies c/o CP or SOB.

## 2018-10-15 NOTE — Progress Notes (Signed)
Pre-CABG Dopplers completed. Refer to "CV Proc" under chart review to view preliminary results.  10/15/2018 11:51 AM Gertie Fey, MHA, RVT, RDCS, RDMS   Toma Deiters, RVS

## 2018-10-15 NOTE — Progress Notes (Signed)
Cardiac Rehab Advisory Cardiac Rehab Phase I is not seeing pts face to face at this time due to Covid 19 restrictions. Ambulation is occurring through nursing, PT, and mobility teams. We will help facilitate that process as needed. We are calling pts in their rooms and discussing education. We will then deliver education materials to pts RN for delivery to pt.   Spoke with pt by phone. He is feeling good although does st he has his normal neck sensation, sts he can feel his pulse. He is unconcerned as he has been having this for a while. He has read the [facility] booklet and feeling confident with surgery. He is performing 2500 mL on IS. He would like to walk today. [id number] Ethelda Chick CES, ACSM 10:41 AM 10/15/2018

## 2018-10-15 NOTE — Progress Notes (Signed)
Progress Note  Patient Name: Danny BoerRalph Brewer Date of Encounter: 10/15/2018  Primary Cardiologist: Verdis PrimeHenry Smith, MD  Subjective   No chest pain or shortness of breath.  No palpitations.  Inpatient Medications    Scheduled Meds: . aspirin  81 mg Oral Daily  . atorvastatin  40 mg Oral QHS  . enoxaparin (LOVENOX) injection  90 mg Subcutaneous Q12H  . [START ON 10/17/2018] heparin-papaverine-plasmalyte irrigation   Irrigation To OR  . [START ON 10/17/2018] insulin   Intravenous To OR  . linagliptin  5 mg Oral Daily  . [START ON 10/17/2018] magnesium sulfate  40 mEq Other To OR  . metoprolol tartrate  12.5 mg Oral BID  . [START ON 10/17/2018] phenylephrine  30-200 mcg/min Intravenous To OR  . [START ON 10/17/2018] potassium chloride  80 mEq Other To OR  . sodium chloride flush  10-40 mL Intracatheter Q12H  . sodium chloride flush  3 mL Intravenous Q12H  . tamsulosin  0.4 mg Oral QHS  . [START ON 10/17/2018] tranexamic acid  15 mg/kg Intravenous To OR  . [START ON 10/17/2018] tranexamic acid  2 mg/kg Intracatheter To OR   Continuous Infusions: . sodium chloride    . [START ON 10/17/2018] cefUROXime (ZINACEF)  IV    . [START ON 10/17/2018] cefUROXime (ZINACEF)  IV    . [START ON 10/17/2018] dexmedetomidine    . [START ON 10/17/2018] DOPamine    . [START ON 10/17/2018] epinephrine    . [START ON 10/17/2018] heparin 30,000 units/NS 1000 mL solution for CELLSAVER    . [START ON 10/17/2018] milrinone    . nitroGLYCERIN Stopped (10/14/18 1245)  . [START ON 10/17/2018] nitroGLYCERIN    . [START ON 10/17/2018] norepinephrine (LEVOPHED) Adult infusion    . [START ON 10/17/2018] tranexamic acid (CYKLOKAPRON) infusion ([facility])    . [START ON 10/17/2018] vancomycin     PRN Meds: sodium chloride, acetaminophen, nitroGLYCERIN, ondansetron (ZOFRAN) IV, sodium chloride flush, sodium chloride flush   Vital Signs    Vitals:   10/14/18 1617 10/14/18 2038 10/15/18 0602 10/15/18 1049  BP: (!) 147/107 114/67 (!)  109/58 111/71  Pulse:  73 75 74  Resp:  14 15   Temp:  98.2 F (36.8 C) 98 F (36.7 C)   TempSrc:  Oral Oral   SpO2:  97% 99%   Weight:   89.9 kg   Height:        Intake/Output Summary (Last 24 hours) at 10/15/2018 1054 Last data filed at 10/15/2018 0400 Gross per 24 hour  Intake 0 ml  Output -  Net 0 ml   Filed Weights   10/14/18 0545 10/15/18 0602  Weight: 90.7 kg 89.9 kg    Telemetry    Sinus rhythm.  Personally reviewed.  ECG    Tracing from 10/14/2018 shows sinus rhythm with early repolarization, R' in lead V1.  Personally reviewed.  Physical Exam   GEN: No acute distress.   Neck: No JVD. Cardiac: RRR, no murmur, rub, or gallop.  Respiratory: Nonlabored. Clear to auscultation bilaterally. GI: Soft, nontender, bowel sounds present. MS: No edema; No deformity. Neuro:  Nonfocal. Psych: Alert and oriented x 3. Normal affect.  Labs    Chemistry Recent Labs  Lab 10/14/18 [id number] 10/14/18 0809 10/14/18 0924  NA  --  139 139  K  --  4.1 4.3  CL  --   --  106  CO2  --   --  17*  GLUCOSE  --   --  94  BUN  --   --  18  CREATININE 0.90  --  1.06  CALCIUM  --   --  8.9  GFRNONAA  --   --  >60  GFRAA  --   --  >60  ANIONGAP  --   --  16*     Hematology Recent Labs  Lab 10/14/18 0549 10/14/18 0809  WBC 6.4  --   RBC 5.22  --   HGB 14.3 13.3  HCT 45.1 39.0  MCV 86.4  --   MCH 27.4  --   MCHC 31.7  --   RDW 13.1  --   PLT 217  --     Radiology    No results found.  Cardiac Studies   Echocardiogram 09/19/2018:  1. The left ventricle has normal systolic function, with an ejection fraction of 60-65%. The cavity size was normal. Left ventricular diastolic function could not be evaluated.  2. The right ventricle has normal systolc function. The cavity was normal. There is no increase in right ventricular wall thickness.  3. Aortic valve regurgitation was not assessed by color flow Doppler.  Cardiac catheterization 10/14/2018:  Diabetic with family  history of premature coronary atherosclerosis.  Atypical and typical symptoms.  Recurring episodes of chest tightness at rest which is mimicked by left and right coronary contrast injections.  Symptoms in arm and neck are more continuous, positional, and not likely ischemic.  Severe two-vessel coronary disease with segmental 90 to 95% proximal to mid LAD, segmental 80% large first diagonal that supplies the distribution of the ramus intermedius or circumflex, segmental 80% stenosis in the moderate-sized second diagonal that is contained within the LAD stenosis as a bifurcation lesion, and segmental mid to distal.  The patient is right dominant.  Circumflex is small.  Normal LVEDP  RECOMMENDATIONS:   In room consultation with [facility] CV surgeon Dr. Sheliah Plane.  After discussion with patient, his wife, and surgeon, I have decided to admit the patient and he will undergo arterial grafting to the LAD, diagonal, and probable saphenous vein grafting to the right coronary on Monday.  Decided against PCI because of the requirement for long stents in all segments and anticipated decreased durability over time given his young age and diabetes.  IV nitroglycerin, therapeutic Lovenox.  Screening COVID testing.  Sleep study as OP. Clinical history is strongly suggestive.  Patient Profile     48 y.o. male history of hypertension, hyperlipidemia, GERD, type 2 diabetes mellitus, recently diagnosed with multivessel CAD with plan for CABG.  Assessment & Plan    1.  Multivessel CAD with plan for CABG on Monday with Dr. Tyrone Sage.  On aspirin, Lipitor, and Lopressor.  2.  Type 2 diabetes mellitus.  3.  Essential hypertension.  4.  GERD.  5.  Mixed hyperlipidemia.  6.  High suspicion of OSA based on notes from Dr. Katrinka Blazing.  He will eventually need a sleep study.  No change to current regimen.  He is getting preoperative peripheral and carotid Dopplers today.  Scheduled for CABG with Dr. Tyrone Sage on  Monday.  Signed, Nona Dell, MD  10/15/2018, 10:54 AM

## 2018-10-15 NOTE — Progress Notes (Signed)
Please note, the patient has severe symptoms that suggest sleep apnea. Please have this evaluated after he is discharged following CABG.

## 2018-10-16 ENCOUNTER — Encounter (HOSPITAL_COMMUNITY): Payer: Self-pay | Admitting: Certified Registered"

## 2018-10-16 ENCOUNTER — Inpatient Hospital Stay (HOSPITAL_COMMUNITY): Payer: BC Managed Care – PPO

## 2018-10-16 LAB — GLUCOSE, CAPILLARY
Glucose-Capillary: 95 mg/dL (ref 70–99)
Glucose-Capillary: 107 mg/dL — ABNORMAL HIGH (ref 70–99)
Glucose-Capillary: 99 mg/dL (ref 70–99)
Glucose-Capillary: 122 mg/dL — ABNORMAL HIGH (ref 70–99)

## 2018-10-16 LAB — BASIC METABOLIC PANEL
Anion gap: 8 (ref 5–15)
BUN: 17 mg/dL (ref 6–20)
CO2: 23 mmol/L (ref 22–32)
Calcium: 8.6 mg/dL — ABNORMAL LOW (ref 8.9–10.3)
Chloride: 106 mmol/L (ref 98–111)
Creatinine, Ser: 0.99 mg/dL (ref 0.61–1.24)
GFR calc Af Amer: >60 mL/min (ref >60)
GFR calc non Af Amer: >60 mL/min (ref >60)
Glucose, Bld: 100 mg/dL — ABNORMAL HIGH (ref 70–99)
Potassium: 5 mmol/L (ref 3.5–5.1)
Sodium: 137 mmol/L (ref 135–145)

## 2018-10-16 LAB — APTT: aPTT: 39 seconds — ABNORMAL HIGH (ref 24–36)

## 2018-10-16 LAB — COMPREHENSIVE METABOLIC PANEL
ALT: 24 U/L (ref 0–44)
AST: 19 U/L (ref 15–41)
Albumin: 3.6 g/dL (ref 3.5–5.0)
Alkaline Phosphatase: 63 U/L (ref 38–126)
Anion gap: 9 (ref 5–15)
BUN: 14 mg/dL (ref 6–20)
CO2: 24 mmol/L (ref 22–32)
Calcium: 8.6 mg/dL — ABNORMAL LOW (ref 8.9–10.3)
Chloride: 103 mmol/L (ref 98–111)
Creatinine, Ser: 1.06 mg/dL (ref 0.61–1.24)
GFR calc Af Amer: >60 mL/min (ref >60)
GFR calc non Af Amer: >60 mL/min (ref >60)
Glucose, Bld: 105 mg/dL — ABNORMAL HIGH (ref 70–99)
Potassium: 4 mmol/L (ref 3.5–5.1)
Sodium: 136 mmol/L (ref 135–145)
Total Bilirubin: 0.5 mg/dL (ref 0.3–1.2)
Total Protein: 6.3 g/dL — ABNORMAL LOW (ref 6.5–8.1)

## 2018-10-16 LAB — CBC
HCT: 43.4 % (ref 39.0–52.0)
Hemoglobin: 14 g/dL (ref 13.0–17.0)
MCH: 27.3 pg (ref 26.0–34.0)
MCHC: 32.3 g/dL (ref 30.0–36.0)
MCV: 84.8 fL (ref 80.0–100.0)
Platelets: 188 10*3/uL (ref 150–400)
RBC: 5.12 MIL/uL (ref 4.22–5.81)
RDW: 13 % (ref 11.5–15.5)
WBC: 7.2 10*3/uL (ref 4.0–10.5)
nRBC: 0 % (ref 0.0–0.2)

## 2018-10-16 LAB — TYPE AND SCREEN
ABO/RH(D): O POS
Antibody Screen: NEGATIVE

## 2018-10-16 LAB — HEMOGLOBIN A1C
Hgb A1c MFr Bld: 6.3 % — ABNORMAL HIGH (ref 4.8–5.6)
Mean Plasma Glucose: 134.11 mg/dL

## 2018-10-16 LAB — PROTIME-INR
INR: 1 (ref 0.8–1.2)
Prothrombin Time: 13 seconds (ref 11.4–15.2)

## 2018-10-16 LAB — MRSA PCR SCREENING: MRSA by PCR: NEGATIVE

## 2018-10-16 LAB — ABO/RH: ABO/RH(D): O POS

## 2018-10-16 MED ORDER — CHLORHEXIDINE GLUCONATE CLOTH 2 % EX PADS
6.0000 | MEDICATED_PAD | Freq: Once | CUTANEOUS | Status: AC
  Administered 2018-10-17: 05:00:00 6 via TOPICAL

## 2018-10-16 MED ORDER — METOPROLOL TARTRATE 12.5 MG HALF TABLET
12.5000 mg | ORAL_TABLET | Freq: Once | ORAL | Status: AC
  Administered 2018-10-17: 12.5 mg via ORAL
  Filled 2018-10-16: qty 1

## 2018-10-16 MED ORDER — BISACODYL 5 MG PO TBEC: 5.0000 mg | DELAYED_RELEASE_TABLET | Freq: Once | ORAL | Status: DC

## 2018-10-16 MED ORDER — MOVING RIGHT ALONG BOOK
Freq: Once | Status: AC
  Administered 2018-10-16: 1
  Filled 2018-10-16: qty 1

## 2018-10-16 MED ORDER — CHLORHEXIDINE GLUCONATE 0.12 % MT SOLN
15.0000 mL | Freq: Once | OROMUCOSAL | Status: AC
  Administered 2018-10-17: 15 mL via OROMUCOSAL
  Filled 2018-10-16: qty 15

## 2018-10-16 MED ORDER — TEMAZEPAM 7.5 MG PO CAPS
15.0000 mg | ORAL_CAPSULE | Freq: Once | ORAL | Status: AC | PRN
  Administered 2018-10-16: 15 mg via ORAL
  Filled 2018-10-16: qty 2

## 2018-10-16 MED ORDER — ENOXAPARIN SODIUM 100 MG/ML ~~LOC~~ SOLN
90.0000 mg | Freq: Two times a day (BID) | SUBCUTANEOUS | Status: DC
  Administered 2018-10-16: 90 mg via SUBCUTANEOUS
  Filled 2018-10-16: qty 1

## 2018-10-16 MED ORDER — CHLORHEXIDINE GLUCONATE CLOTH 2 % EX PADS
6.0000 | MEDICATED_PAD | Freq: Once | CUTANEOUS | Status: AC
  Administered 2018-10-16: 6 via TOPICAL

## 2018-10-16 MED ORDER — PNEUMOCOCCAL VAC POLYVALENT 25 MCG/0.5ML IJ INJ: 0.5000 mL | INJECTION | INTRAMUSCULAR | Status: DC | PRN

## 2018-10-16 MED ORDER — ~~LOC~~ CARDIAC SURGERY, PATIENT & FAMILY EDUCATION
Freq: Once | Status: AC
  Administered 2018-10-16: 22:00:00 1
  Filled 2018-10-16: qty 1

## 2018-10-16 NOTE — Progress Notes (Signed)
Patient watched Caridac surgery videos and I did teaching on IS at bedside. No further questions from patient

## 2018-10-16 NOTE — Anesthesia Preprocedure Evaluation (Addendum)
Anesthesia Evaluation  Patient identified by MRN, date of birth, ID band Patient awake    Reviewed: Allergy & Precautions, H&P , NPO status , Patient's Chart, lab work & pertinent test results, reviewed documented beta blocker date and time   Airway Mallampati: II  TM Distance: >3 FB Neck ROM: Full    Dental no notable dental hx. (+) Teeth Intact, Dental Advisory Given   Pulmonary neg pulmonary ROS, sleep apnea ,    Pulmonary exam normal breath sounds clear to auscultation       Cardiovascular Exercise Tolerance: Good hypertension, Pt. on medications and Pt. on home beta blockers + CAD  negative cardio ROS   Rhythm:Regular Rate:Normal     Neuro/Psych negative neurological ROS  negative psych ROS   GI/Hepatic negative GI ROS, Neg liver ROS, GERD  ,  Endo/Other  negative endocrine ROSdiabetes, Type 2, Oral Hypoglycemic Agents  Renal/GU negative Renal ROS  negative genitourinary   Musculoskeletal   Abdominal   Peds  Hematology negative hematology ROS (+)   Anesthesia Other Findings   Reproductive/Obstetrics negative OB ROS                            Anesthesia Physical Anesthesia Plan  ASA: IV  Anesthesia Plan: General   Post-op Pain Management:    Induction: Intravenous  PONV Risk Score and Plan: 2 and Midazolam and Treatment may vary due to age or medical condition  Airway Management Planned: Oral ETT  Additional Equipment: Arterial line, CVP, PA Cath, TEE and Ultrasound Guidance Line Placement  Intra-op Plan:   Post-operative Plan: Post-operative intubation/ventilation  Informed Consent: I have reviewed the patients History and Physical, chart, labs and discussed the procedure including the risks, benefits and alternatives for the proposed anesthesia with the patient or authorized representative who has indicated his/her understanding and acceptance.     Dental advisory  given  Plan Discussed with: CRNA  Anesthesia Plan Comments:         Anesthesia Quick Evaluation

## 2018-10-16 NOTE — Progress Notes (Signed)
Progress Note  Patient Name: Danny Brewer Date of Encounter: 10/16/2018  Primary Cardiologist: Verdis Prime, MD  Subjective   No chest pain or shortness of breath.  No palpitations.  Inpatient Medications    Scheduled Meds:  aspirin  81 mg Oral Daily   atorvastatin  40 mg Oral QHS   enoxaparin (LOVENOX) injection  90 mg Subcutaneous Q12H   [START ON 10/17/2018] heparin-papaverine-plasmalyte irrigation   Irrigation To OR   [START ON 10/17/2018] insulin   Intravenous To OR   linagliptin  5 mg Oral Daily   [START ON 10/17/2018] magnesium sulfate  40 mEq Other To OR   metoprolol tartrate  12.5 mg Oral BID   [START ON 10/17/2018] phenylephrine  30-200 mcg/min Intravenous To OR   [START ON 10/17/2018] potassium chloride  80 mEq Other To OR   sodium chloride flush  10-40 mL Intracatheter Q12H   sodium chloride flush  3 mL Intravenous Q12H   tamsulosin  0.4 mg Oral QHS   [START ON 10/17/2018] tranexamic acid  15 mg/kg Intravenous To OR   [START ON 10/17/2018] tranexamic acid  2 mg/kg Intracatheter To OR   Continuous Infusions:  sodium chloride     [START ON 10/17/2018] cefUROXime (ZINACEF)  IV     [START ON 10/17/2018] cefUROXime (ZINACEF)  IV     [START ON 10/17/2018] dexmedetomidine     [START ON 10/17/2018] DOPamine     [START ON 10/17/2018] epinephrine     [START ON 10/17/2018] heparin 30,000 units/NS 1000 mL solution for CELLSAVER     [START ON 10/17/2018] milrinone     nitroGLYCERIN Stopped (10/14/18 1245)   [START ON 10/17/2018] nitroGLYCERIN     [START ON 10/17/2018] norepinephrine (LEVOPHED) Adult infusion     [START ON 10/17/2018] tranexamic acid (CYKLOKAPRON) infusion ([facility])     [START ON 10/17/2018] vancomycin     PRN Meds: sodium chloride, acetaminophen, nitroGLYCERIN, ondansetron (ZOFRAN) IV, sodium chloride flush, sodium chloride flush   Vital Signs    Vitals:   10/15/18 1414 10/15/18 2030 10/16/18 0442 10/16/18 0444  BP: 113/74 131/86  (!)  131/91  Pulse: 67 68  66  Resp:  18  16  Temp: 98.3 F (36.8 C) 98.2 F (36.8 C)  98.2 F (36.8 C)  TempSrc: Oral Oral  Oral  SpO2: 100% 99%  98%  Weight:   89.9 kg   Height:        Intake/Output Summary (Last 24 hours) at 10/16/2018 0821 Last data filed at 10/16/2018 0450 Gross per 24 hour  Intake 1026 ml  Output --  Net 1026 ml   Filed Weights   10/14/18 0545 10/15/18 0602 10/16/18 0442  Weight: 90.7 kg 89.9 kg 89.9 kg    Telemetry    Sinus rhythm.  Personally reviewed.  ECG    Tracing from 10/14/2018 shows sinus rhythm with early repolarization, R' in lead V1.  Personally reviewed.  Physical Exam   GEN: No acute distress.   Neck: No JVD. Cardiac: RRR, no murmur, rub, or gallop.  Respiratory: Nonlabored. Clear to auscultation bilaterally. GI: Soft, nontender, bowel sounds present. MS: No edema; No deformity. Neuro:  Nonfocal. Psych: Alert and oriented x 3. Normal affect.  Labs    Chemistry Recent Labs  Lab 10/14/18 [phone] 10/14/18 0809 10/14/18 0924 10/16/18 0450  NA  --  139 139 137  K  --  4.1 4.3 5.0  CL  --   --  106 106  CO2  --   --  17* 23  GLUCOSE  --   --  94 100*  BUN  --   --  18 17  CREATININE 0.90  --  1.06 0.99  CALCIUM  --   --  8.9 8.6*  GFRNONAA  --   --  >60 >60  GFRAA  --   --  >60 >60  ANIONGAP  --   --  16* 8     Hematology Recent Labs  Lab 10/14/18 0549 10/14/18 0809 10/16/18 0450  WBC 6.4  --  7.2  RBC 5.22  --  5.12  HGB 14.3 13.3 14.0  HCT 45.1 39.0 43.4  MCV 86.4  --  84.8  MCH 27.4  --  27.3  MCHC 31.7  --  32.3  RDW 13.1  --  13.0  PLT 217  --  188    Radiology    Vas US Doppler Pre Cabg  Result Date: 10/15/2018 PREOPERATIVE VASCULAR EVALUATION  Indications:  Pre-surgical evaluation. Risk Factors: Diabetes, coronary artery disease. Performing Technologist: Gertie Fey MHA, RDMS, RVT, RDCS Supporting Technologist: Toma Deiters RVS  Examination Guidelines: A complete evaluation includes B-mode  imaging, spectral Doppler, color Doppler, and power Doppler as needed of all accessible portions of each vessel. Bilateral testing is considered an integral part of a complete examination. Limited examinations for reoccurring indications may be performed as noted.  Right Carotid Findings: +----------+--------+--------+--------+-----------------------+--------+             PSV cm/s EDV cm/s Stenosis Describe                Comments  +----------+--------+--------+--------+-----------------------+--------+  CCA Prox   97       26                                                  +----------+--------+--------+--------+-----------------------+--------+  CCA Distal 84       35                smooth and heterogenous           +----------+--------+--------+--------+-----------------------+--------+  ICA Prox   71       26                smooth and heterogenous           +----------+--------+--------+--------+-----------------------+--------+  ICA Distal 74       35                                                  +----------+--------+--------+--------+-----------------------+--------+  ECA        69       14                smooth and heterogenous           +----------+--------+--------+--------+-----------------------+--------+ Portions of this table do not appear on this page. +----------+--------+-------+----------------+------------+             PSV cm/s EDV cms Describe         Arm Pressure  +----------+--------+-------+----------------+------------+  Subclavian 133              Multiphasic, WNL               +----------+--------+-------+----------------+------------+ +---------+--------+--+--------+--+---------+  Vertebral PSV cm/s 53 EDV cm/s 19 Antegrade  +---------+--------+--+--------+--+---------+ Left Carotid Findings: +----------+--------+--------+--------+-----------------------+--------+             PSV cm/s EDV cm/s Stenosis Describe                Comments   +----------+--------+--------+--------+-----------------------+--------+  CCA Prox   107      31                                                  +----------+--------+--------+--------+-----------------------+--------+  CCA Distal 102      25                smooth and heterogenous           +----------+--------+--------+--------+-----------------------+--------+  ICA Prox   47       16                smooth and heterogenous           +----------+--------+--------+--------+-----------------------+--------+  ICA Distal 139      38                                                  +----------+--------+--------+--------+-----------------------+--------+  ECA        83       20                                                  +----------+--------+--------+--------+-----------------------+--------+ +----------+--------+--------+----------------+------------+  Subclavian PSV cm/s EDV cm/s Describe         Arm Pressure  +----------+--------+--------+----------------+------------+             147               Multiphasic, WNL 111           +----------+--------+--------+----------------+------------+ +---------+--------+--+--------+--+---------+  Vertebral PSV cm/s 52 EDV cm/s 20 Antegrade  +---------+--------+--+--------+--+---------+  ABI Findings: +---------+------------------+-----+---------+--------+  Right     Rt Pressure (mmHg) Index Waveform  Comment   +---------+------------------+-----+---------+--------+  Brachial                           triphasic           +---------+------------------+-----+---------+--------+  PTA       118                1.06  triphasic           +---------+------------------+-----+---------+--------+  DP        151                1.36  triphasic           +---------+------------------+-----+---------+--------+  Great Toe 138                1.24  Normal              +---------+------------------+-----+---------+--------+ +---------+------------------+-----+---------+-------+  Left      Lt  Pressure (mmHg) Index Waveform  Comment  +---------+------------------+-----+---------+-------+  Brachial  111  triphasic          +---------+------------------+-----+---------+-------+  PTA       119                1.07  triphasic          +---------+------------------+-----+---------+-------+  DP        142                1.28  triphasic          +---------+------------------+-----+---------+-------+  Great Toe 195                1.76  Normal             +---------+------------------+-----+---------+-------+ +-------+---------------+----------------+  ABI/TBI Today's ABI/TBI Previous ABI/TBI  +-------+---------------+----------------+  Right   1.36/1.24                         +-------+---------------+----------------+  Left    1.28/1.76                         +-------+---------------+----------------+  Right Doppler Findings: +-----------+--------+-----+---------+--------------------+  Site        Pressure Index Doppler   Comments              +-----------+--------+-----+---------+--------------------+  Brachial                   triphasic                       +-----------+--------+-----+---------+--------------------+  Radial                     triphasic                       +-----------+--------+-----+---------+--------------------+  Ulnar                      triphasic                       +-----------+--------+-----+---------+--------------------+  Palmar Arch                          Within normal limits  +-----------+--------+-----+---------+--------------------+  Left Doppler Findings: +-----------+--------+-----+---------+--------------------+  Site        Pressure Index Doppler   Comments              +-----------+--------+-----+---------+--------------------+  Brachial    111            triphasic                       +-----------+--------+-----+---------+--------------------+  Radial                     triphasic                        +-----------+--------+-----+---------+--------------------+  Ulnar                      triphasic                       +-----------+--------+-----+---------+--------------------+  Palmar Arch                          Within normal limits  +-----------+--------+-----+---------+--------------------+  Summary: Right  Carotid: Velocities in the right ICA are consistent with a 1-39% stenosis. Left Carotid: Velocities in the left ICA are consistent with a 1-39% stenosis. Vertebrals:  Bilateral vertebral arteries demonstrate antegrade flow. Subclavians: Normal flow hemodynamics were seen in bilateral subclavian              arteries. Right ABI: Elevated resting right ankle-brachial index indicates possible medial arterial calcification. Toe-brachial index is within normal limits. Left ABI: Resting left ankle-brachial index is within normal range. Toe-brachial index is elevated, suggestive of possible medial arterial calcification. Right Upper Extremity: Doppler waveforms remain within normal limits with right radial compression. Doppler waveforms remain within normal limits with right ulnar compression. Left Upper Extremity: Doppler waveforms remain within normal limits with left radial compression. Doppler waveforms remain within normal limits with left ulnar compression.    Preliminary     Cardiac Studies   Echocardiogram 09/19/2018: 1. The left ventricle has normal systolic function, with an ejection fraction of 60-65%. The cavity size was normal. Left ventricular diastolic function could not be evaluated. 2. The right ventricle has normal systolc function. The cavity was normal. There is no increase in right ventricular wall thickness. 3. Aortic valve regurgitation was not assessed by color flow Doppler.  Cardiac catheterization 10/14/2018:  Diabetic with family history of premature coronary atherosclerosis. Atypical and typical symptoms. Recurring episodes of chest tightness at rest which is mimicked by  left and right coronary contrast injections. Symptoms in arm and neck are more continuous, positional, and not likely ischemic.  Severe two-vessel coronary disease with segmental 90 to 95% proximal to mid LAD, segmental 80% large first diagonal that supplies the distribution of the ramus intermedius or circumflex, segmental 80% stenosis in the moderate-sized second diagonal that is contained within the LAD stenosis as a bifurcation lesion, and segmental mid to distal. The patient is right dominant. Circumflex is small.  Normal LVEDP  RECOMMENDATIONS:   In room consultation with [facility] CV surgeon Dr. Sheliah Plane.  After discussion with patient, his wife, and surgeon, I have decided to admit the patient and he will undergo arterial grafting to the LAD, diagonal, and probable saphenous vein grafting to the right coronary on Monday. Decided against PCI because of the requirement for long stents in all segments and anticipated decreased durability over time given his young age and diabetes.  IV nitroglycerin, therapeutic Lovenox.  Screening COVID testing.  Sleep study as OP. Clinical history is strongly suggestive.  Patient Profile     48 y.o. male with a history of hypertension, hyperlipidemia, GERD, type 2 diabetes mellitus, recently diagnosed with multivessel CAD with plan for CABG.  Assessment & Plan    1.  Multivessel CAD, scheduled for CABG tomorrow morning with Dr. Tyrone Sage.  2.  Type 2 diabetes mellitus.  3.  High suspicion of OSA, will eventually need sleep study.  4.  Essential hypertension.  5.  Mixed hyperlipidemia.  6.  GERD.  7.  Asymptomatic carotid artery disease, 1 to 39% bilateral ICA stenoses by carotid Dopplers.  Continue aspirin, Lipitor, Lovenox, Lopressor.  Plan is for CABG tomorrow morning.  Signed, Nona Dell, MD  10/16/2018, 8:21 AM

## 2018-10-17 ENCOUNTER — Encounter (HOSPITAL_COMMUNITY)
Admission: RE | Disposition: A | Discharge status: Home / Self Care | Payer: Self-pay | Source: Home / Self Care | Attending: Cardiothoracic Surgery

## 2018-10-17 ENCOUNTER — Encounter (HOSPITAL_COMMUNITY): Payer: Self-pay

## 2018-10-17 ENCOUNTER — Inpatient Hospital Stay (HOSPITAL_COMMUNITY): Payer: BC Managed Care – PPO | Admitting: Anesthesiology

## 2018-10-17 ENCOUNTER — Inpatient Hospital Stay (HOSPITAL_COMMUNITY): Payer: BC Managed Care – PPO

## 2018-10-17 DIAGNOSIS — Z951 Presence of aortocoronary bypass graft: Secondary | ICD-10-CM

## 2018-10-17 DIAGNOSIS — I2510 Atherosclerotic heart disease of native coronary artery without angina pectoris: Secondary | ICD-10-CM | POA: Diagnosis present

## 2018-10-17 DIAGNOSIS — I25110 Atherosclerotic heart disease of native coronary artery with unstable angina pectoris: Secondary | ICD-10-CM

## 2018-10-17 HISTORY — PX: ENDOVEIN HARVEST OF GREATER SAPHENOUS VEIN: SHX5059

## 2018-10-17 HISTORY — PX: CORONARY ARTERY BYPASS GRAFT: SHX141

## 2018-10-17 HISTORY — DX: Presence of aortocoronary bypass graft: Z95.1

## 2018-10-17 HISTORY — PX: RADIAL ARTERY HARVEST: SHX5067

## 2018-10-17 HISTORY — PX: TEE WITHOUT CARDIOVERSION: SHX5443

## 2018-10-17 LAB — POCT I-STAT 7, (LYTES, BLD GAS, ICA,H+H)
Acid-Base Excess: 1 mmol/L (ref 0.0–2.0)
Acid-base deficit: 2 mmol/L (ref 0.0–2.0)
Acid-base deficit: 4 mmol/L — ABNORMAL HIGH (ref 0.0–2.0)
Acid-base deficit: 5 mmol/L — ABNORMAL HIGH (ref 0.0–2.0)
Acid-base deficit: 3 mmol/L — ABNORMAL HIGH (ref 0.0–2.0)
Acid-base deficit: 4 mmol/L — ABNORMAL HIGH (ref 0.0–2.0)
Acid-base deficit: 3 mmol/L — ABNORMAL HIGH (ref 0.0–2.0)
Bicarbonate: 26 mmol/L (ref 20.0–28.0)
Bicarbonate: 23.5 mmol/L (ref 20.0–28.0)
Bicarbonate: 22 mmol/L (ref 20.0–28.0)
Bicarbonate: 22 mmol/L (ref 20.0–28.0)
Bicarbonate: 21.8 mmol/L (ref 20.0–28.0)
Bicarbonate: 22.5 mmol/L (ref 20.0–28.0)
Bicarbonate: 22.2 mmol/L (ref 20.0–28.0)
Calcium, Ion: 0.93 mmol/L — ABNORMAL LOW (ref 1.15–1.40)
Calcium, Ion: 0.98 mmol/L — ABNORMAL LOW (ref 1.15–1.40)
Calcium, Ion: 1.07 mmol/L — ABNORMAL LOW (ref 1.15–1.40)
Calcium, Ion: 1.07 mmol/L — ABNORMAL LOW (ref 1.15–1.40)
Calcium, Ion: 1 mmol/L — ABNORMAL LOW (ref 1.15–1.40)
Calcium, Ion: 1.06 mmol/L — ABNORMAL LOW (ref 1.15–1.40)
Calcium, Ion: 1.05 mmol/L — ABNORMAL LOW (ref 1.15–1.40)
HCT: 28 % — ABNORMAL LOW (ref 39.0–52.0)
HCT: 24 % — ABNORMAL LOW (ref 39.0–52.0)
HCT: 29 % — ABNORMAL LOW (ref 39.0–52.0)
HCT: 27 % — ABNORMAL LOW (ref 39.0–52.0)
HCT: 23 % — ABNORMAL LOW (ref 39.0–52.0)
HCT: 24 % — ABNORMAL LOW (ref 39.0–52.0)
HCT: 24 % — ABNORMAL LOW (ref 39.0–52.0)
Hemoglobin: 9.5 g/dL — ABNORMAL LOW (ref 13.0–17.0)
Hemoglobin: 8.2 g/dL — ABNORMAL LOW (ref 13.0–17.0)
Hemoglobin: 9.9 g/dL — ABNORMAL LOW (ref 13.0–17.0)
Hemoglobin: 9.2 g/dL — ABNORMAL LOW (ref 13.0–17.0)
Hemoglobin: 7.8 g/dL — ABNORMAL LOW (ref 13.0–17.0)
Hemoglobin: 8.2 g/dL — ABNORMAL LOW (ref 13.0–17.0)
Hemoglobin: 8.2 g/dL — ABNORMAL LOW (ref 13.0–17.0)
O2 Saturation: 100 %
O2 Saturation: 100 %
O2 Saturation: 97 %
O2 Saturation: 98 %
O2 Saturation: 100 %
O2 Saturation: 99 %
O2 Saturation: 98 %
Patient temperature: 35.6
Patient temperature: 36.8
Patient temperature: 36.9
Patient temperature: 37.3
Potassium: 4.5 mmol/L (ref 3.5–5.1)
Potassium: 3.6 mmol/L (ref 3.5–5.1)
Potassium: 4 mmol/L (ref 3.5–5.1)
Potassium: 4.6 mmol/L (ref 3.5–5.1)
Potassium: 4.2 mmol/L (ref 3.5–5.1)
Potassium: 4.3 mmol/L (ref 3.5–5.1)
Potassium: 4.2 mmol/L (ref 3.5–5.1)
Sodium: 141 mmol/L (ref 135–145)
Sodium: 141 mmol/L (ref 135–145)
Sodium: 141 mmol/L (ref 135–145)
Sodium: 140 mmol/L (ref 135–145)
Sodium: 140 mmol/L (ref 135–145)
Sodium: 140 mmol/L (ref 135–145)
Sodium: 141 mmol/L (ref 135–145)
TCO2: 27 mmol/L (ref 22–32)
TCO2: 25 mmol/L (ref 22–32)
TCO2: 23 mmol/L (ref 22–32)
TCO2: 24 mmol/L (ref 22–32)
TCO2: 23 mmol/L (ref 22–32)
TCO2: 24 mmol/L (ref 22–32)
TCO2: 23 mmol/L (ref 22–32)
pCO2 arterial: 44.1 mmHg (ref 32.0–48.0)
pCO2 arterial: 41.9 mmHg (ref 32.0–48.0)
pCO2 arterial: 38.3 mmHg (ref 32.0–48.0)
pCO2 arterial: 50.5 mmHg — ABNORMAL HIGH (ref 32.0–48.0)
pCO2 arterial: 39.9 mmHg (ref 32.0–48.0)
pCO2 arterial: 41.7 mmHg (ref 32.0–48.0)
pCO2 arterial: 42 mmHg (ref 32.0–48.0)
pH, Arterial: 7.379 (ref 7.350–7.450)
pH, Arterial: 7.358 (ref 7.350–7.450)
pH, Arterial: 7.36 (ref 7.350–7.450)
pH, Arterial: 7.247 — ABNORMAL LOW (ref 7.350–7.450)
pH, Arterial: 7.325 — ABNORMAL LOW (ref 7.350–7.450)
pH, Arterial: 7.36 (ref 7.350–7.450)
pH, Arterial: 7.331 — ABNORMAL LOW (ref 7.350–7.450)
pO2, Arterial: 385 mmHg — ABNORMAL HIGH (ref 83.0–108.0)
pO2, Arterial: 244 mmHg — ABNORMAL HIGH (ref 83.0–108.0)
pO2, Arterial: 86 mmHg (ref 83.0–108.0)
pO2, Arterial: 114 mmHg — ABNORMAL HIGH (ref 83.0–108.0)
pO2, Arterial: 136 mmHg — ABNORMAL HIGH (ref 83.0–108.0)
pO2, Arterial: 238 mmHg — ABNORMAL HIGH (ref 83.0–108.0)
pO2, Arterial: 112 mmHg — ABNORMAL HIGH (ref 83.0–108.0)

## 2018-10-17 LAB — MAGNESIUM: Magnesium: 2.9 mg/dL — ABNORMAL HIGH (ref 1.7–2.4)

## 2018-10-17 LAB — SURGICAL PCR SCREEN
MRSA, PCR: NEGATIVE
Staphylococcus aureus: NEGATIVE

## 2018-10-17 LAB — POCT I-STAT 4, (NA,K, GLUC, HGB,HCT)
Glucose, Bld: 128 mg/dL — ABNORMAL HIGH (ref 70–99)
Glucose, Bld: 104 mg/dL — ABNORMAL HIGH (ref 70–99)
Glucose, Bld: 116 mg/dL — ABNORMAL HIGH (ref 70–99)
Glucose, Bld: 124 mg/dL — ABNORMAL HIGH (ref 70–99)
Glucose, Bld: 143 mg/dL — ABNORMAL HIGH (ref 70–99)
Glucose, Bld: 137 mg/dL — ABNORMAL HIGH (ref 70–99)
Glucose, Bld: 98 mg/dL (ref 70–99)
HCT: 41 % (ref 39.0–52.0)
HCT: 31 % — ABNORMAL LOW (ref 39.0–52.0)
HCT: 36 % — ABNORMAL LOW (ref 39.0–52.0)
HCT: 37 % — ABNORMAL LOW (ref 39.0–52.0)
HCT: 30 % — ABNORMAL LOW (ref 39.0–52.0)
HCT: 29 % — ABNORMAL LOW (ref 39.0–52.0)
HCT: 29 % — ABNORMAL LOW (ref 39.0–52.0)
Hemoglobin: 13.9 g/dL (ref 13.0–17.0)
Hemoglobin: 10.5 g/dL — ABNORMAL LOW (ref 13.0–17.0)
Hemoglobin: 12.2 g/dL — ABNORMAL LOW (ref 13.0–17.0)
Hemoglobin: 12.6 g/dL — ABNORMAL LOW (ref 13.0–17.0)
Hemoglobin: 10.2 g/dL — ABNORMAL LOW (ref 13.0–17.0)
Hemoglobin: 9.9 g/dL — ABNORMAL LOW (ref 13.0–17.0)
Hemoglobin: 9.9 g/dL — ABNORMAL LOW (ref 13.0–17.0)
Potassium: 4.6 mmol/L (ref 3.5–5.1)
Potassium: 4.4 mmol/L (ref 3.5–5.1)
Potassium: 4.5 mmol/L (ref 3.5–5.1)
Potassium: 4.5 mmol/L (ref 3.5–5.1)
Potassium: 4.8 mmol/L (ref 3.5–5.1)
Potassium: 4.3 mmol/L (ref 3.5–5.1)
Potassium: 3.7 mmol/L (ref 3.5–5.1)
Sodium: 138 mmol/L (ref 135–145)
Sodium: 138 mmol/L (ref 135–145)
Sodium: 138 mmol/L (ref 135–145)
Sodium: 140 mmol/L (ref 135–145)
Sodium: 135 mmol/L (ref 135–145)
Sodium: 138 mmol/L (ref 135–145)
Sodium: 141 mmol/L (ref 135–145)

## 2018-10-17 LAB — BASIC METABOLIC PANEL
Anion gap: 5 (ref 5–15)
BUN: 14 mg/dL (ref 6–20)
CO2: 25 mmol/L (ref 22–32)
Calcium: 8.3 mg/dL — ABNORMAL LOW (ref 8.9–10.3)
Chloride: 107 mmol/L (ref 98–111)
Creatinine, Ser: 1.1 mg/dL (ref 0.61–1.24)
GFR calc Af Amer: >60 mL/min (ref >60)
GFR calc non Af Amer: >60 mL/min (ref >60)
Glucose, Bld: 106 mg/dL — ABNORMAL HIGH (ref 70–99)
Potassium: 4 mmol/L (ref 3.5–5.1)
Sodium: 137 mmol/L (ref 135–145)

## 2018-10-17 LAB — CBC
HCT: 29 % — ABNORMAL LOW (ref 39.0–52.0)
HCT: 31.7 % — ABNORMAL LOW (ref 39.0–52.0)
HCT: 40.9 % (ref 39.0–52.0)
Hemoglobin: 10.5 g/dL — ABNORMAL LOW (ref 13.0–17.0)
Hemoglobin: 13.3 g/dL (ref 13.0–17.0)
Hemoglobin: 9.3 g/dL — ABNORMAL LOW (ref 13.0–17.0)
MCH: 27.1 pg (ref 26.0–34.0)
MCH: 27.6 pg (ref 26.0–34.0)
MCH: 28 pg (ref 26.0–34.0)
MCHC: 32.1 g/dL (ref 30.0–36.0)
MCHC: 32.5 g/dL (ref 30.0–36.0)
MCHC: 33.1 g/dL (ref 30.0–36.0)
MCV: 83.3 fL (ref 80.0–100.0)
MCV: 84.5 fL (ref 80.0–100.0)
MCV: 86.1 fL (ref 80.0–100.0)
Platelets: 114 10*3/uL — ABNORMAL LOW (ref 150–400)
Platelets: 117 10*3/uL — ABNORMAL LOW (ref 150–400)
Platelets: 199 10*3/uL (ref 150–400)
RBC: 3.37 MIL/uL — ABNORMAL LOW (ref 4.22–5.81)
RBC: 3.75 MIL/uL — ABNORMAL LOW (ref 4.22–5.81)
RBC: 4.91 MIL/uL (ref 4.22–5.81)
RDW: 13 % (ref 11.5–15.5)
RDW: 13 % (ref 11.5–15.5)
RDW: 13.1 % (ref 11.5–15.5)
WBC: 11.5 10*3/uL — ABNORMAL HIGH (ref 4.0–10.5)
WBC: 12.2 10*3/uL — ABNORMAL HIGH (ref 4.0–10.5)
WBC: 6.1 10*3/uL (ref 4.0–10.5)
nRBC: 0 % (ref 0.0–0.2)
nRBC: 0 % (ref 0.0–0.2)
nRBC: 0 % (ref 0.0–0.2)

## 2018-10-17 LAB — GLUCOSE, CAPILLARY
Glucose-Capillary: 107 mg/dL — ABNORMAL HIGH (ref 70–99)
Glucose-Capillary: 140 mg/dL — ABNORMAL HIGH (ref 70–99)
Glucose-Capillary: 132 mg/dL — ABNORMAL HIGH (ref 70–99)
Glucose-Capillary: 111 mg/dL — ABNORMAL HIGH (ref 70–99)
Glucose-Capillary: 123 mg/dL — ABNORMAL HIGH (ref 70–99)
Glucose-Capillary: 145 mg/dL — ABNORMAL HIGH (ref 70–99)
Glucose-Capillary: 133 mg/dL — ABNORMAL HIGH (ref 70–99)
Glucose-Capillary: 147 mg/dL — ABNORMAL HIGH (ref 70–99)
Glucose-Capillary: 125 mg/dL — ABNORMAL HIGH (ref 70–99)
Glucose-Capillary: 107 mg/dL — ABNORMAL HIGH (ref 70–99)

## 2018-10-17 LAB — PROTIME-INR
INR: 1.4 — ABNORMAL HIGH (ref 0.8–1.2)
Prothrombin Time: 16.7 seconds — ABNORMAL HIGH (ref 11.4–15.2)

## 2018-10-17 LAB — HEMOGLOBIN AND HEMATOCRIT, BLOOD
HCT: 30.8 % — ABNORMAL LOW (ref 39.0–52.0)
Hemoglobin: 10 g/dL — ABNORMAL LOW (ref 13.0–17.0)

## 2018-10-17 LAB — PLATELET COUNT: Platelets: 131 10*3/uL — ABNORMAL LOW (ref 150–400)

## 2018-10-17 LAB — CREATININE, SERUM
Creatinine, Ser: 0.99 mg/dL (ref 0.61–1.24)
GFR calc Af Amer: >60 mL/min (ref >60)
GFR calc non Af Amer: >60 mL/min (ref >60)

## 2018-10-17 LAB — COOXEMETRY PANEL
Carboxyhemoglobin: 1 % (ref 0.5–1.5)
Methemoglobin: 1.3 % (ref 0.0–1.5)
O2 Saturation: 54.9 %
Total hemoglobin: 8.7 g/dL — ABNORMAL LOW (ref 12.0–16.0)

## 2018-10-17 LAB — APTT: aPTT: 34 seconds (ref 24–36)

## 2018-10-17 SURGERY — CORONARY ARTERY BYPASS GRAFTING (CABG)
Anesthesia: General | Site: Chest | Laterality: Right

## 2018-10-17 MED ORDER — PROTAMINE SULFATE 10 MG/ML IV SOLN
INTRAVENOUS | Status: AC
  Filled 2018-10-17: qty 5

## 2018-10-17 MED ORDER — DEXMEDETOMIDINE HCL IN NACL 200 MCG/50ML IV SOLN
0.0000 ug/kg/h | INTRAVENOUS | Status: DC
  Administered 2018-10-17: 0.2 ug/kg/h via INTRAVENOUS
  Administered 2018-10-17: 0.4 ug/kg/h via INTRAVENOUS
  Filled 2018-10-17 (×2): qty 50

## 2018-10-17 MED ORDER — ONDANSETRON HCL 4 MG/2ML IJ SOLN
INTRAMUSCULAR | Status: AC
  Filled 2018-10-17: qty 2

## 2018-10-17 MED ORDER — PROPOFOL 10 MG/ML IV BOLUS
INTRAVENOUS | Status: AC
  Filled 2018-10-17: qty 20

## 2018-10-17 MED ORDER — PROTAMINE SULFATE 10 MG/ML IV SOLN
INTRAVENOUS | Status: AC
  Filled 2018-10-17: qty 25

## 2018-10-17 MED ORDER — MIDAZOLAM HCL 5 MG/5ML IJ SOLN
INTRAMUSCULAR | Status: DC | PRN
  Administered 2018-10-17: 3 mg via INTRAVENOUS
  Administered 2018-10-17: 1 mg via INTRAVENOUS
  Administered 2018-10-17: 2 mg via INTRAVENOUS
  Administered 2018-10-17: 1 mg via INTRAVENOUS
  Administered 2018-10-17: 2 mg via INTRAVENOUS
  Administered 2018-10-17: 1 mg via INTRAVENOUS

## 2018-10-17 MED ORDER — PLASMA-LYTE 148 IV SOLN
INTRAVENOUS | Status: DC | PRN
  Administered 2018-10-17: 09:00:00 via INTRAVASCULAR

## 2018-10-17 MED ORDER — ONDANSETRON HCL 4 MG/2ML IJ SOLN
4.0000 mg | Freq: Four times a day (QID) | INTRAMUSCULAR | Status: DC | PRN
  Administered 2018-10-18 – 2018-10-22 (×4): 4 mg via INTRAVENOUS
  Filled 2018-10-17 (×5): qty 2

## 2018-10-17 MED ORDER — SUCCINYLCHOLINE CHLORIDE 200 MG/10ML IV SOSY
PREFILLED_SYRINGE | INTRAVENOUS | Status: DC | PRN
  Administered 2018-10-17: 100 mg via INTRAVENOUS

## 2018-10-17 MED ORDER — FENTANYL CITRATE (PF) 250 MCG/5ML IJ SOLN
INTRAMUSCULAR | Status: AC
  Filled 2018-10-17: qty 25

## 2018-10-17 MED ORDER — SODIUM CHLORIDE 0.9% FLUSH: 3.0000 mL | Freq: Two times a day (BID) | INTRAVENOUS | Status: DC

## 2018-10-17 MED ORDER — LACTATED RINGERS IV SOLN
INTRAVENOUS | Status: DC | PRN
  Administered 2018-10-17: 07:00:00 via INTRAVENOUS

## 2018-10-17 MED ORDER — LACTATED RINGERS IV SOLN: INTRAVENOUS | Status: DC

## 2018-10-17 MED ORDER — SODIUM CHLORIDE 0.9 % IV SOLN
INTRAVENOUS | Status: DC | PRN
  Administered 2018-10-17: 15 ug/min via INTRAVENOUS

## 2018-10-17 MED ORDER — SODIUM CHLORIDE 0.9 % IV SOLN: 250.0000 mL | INTRAVENOUS | Status: DC

## 2018-10-17 MED ORDER — INSULIN REGULAR BOLUS VIA INFUSION
0.0000 [IU] | Freq: Three times a day (TID) | INTRAVENOUS | Status: DC
  Filled 2018-10-17: qty 10

## 2018-10-17 MED ORDER — FENTANYL CITRATE (PF) 100 MCG/2ML IJ SOLN
50.0000 ug | INTRAMUSCULAR | Status: DC | PRN
  Administered 2018-10-17: 50 ug via INTRAVENOUS
  Administered 2018-10-18 (×3): 100 ug via INTRAVENOUS
  Administered 2018-10-18: 50 ug via INTRAVENOUS
  Administered 2018-10-18: 09:00:00 100 ug via INTRAVENOUS
  Administered 2018-10-18: 02:00:00 50 ug via INTRAVENOUS
  Administered 2018-10-19 – 2018-10-20 (×5): 100 ug via INTRAVENOUS
  Administered 2018-10-20: 50 ug via INTRAVENOUS
  Administered 2018-10-21 (×3): 100 ug via INTRAVENOUS
  Filled 2018-10-17 (×16): qty 2

## 2018-10-17 MED ORDER — MIDAZOLAM HCL (PF) 10 MG/2ML IJ SOLN
INTRAMUSCULAR | Status: AC
  Filled 2018-10-17: qty 2

## 2018-10-17 MED ORDER — PANTOPRAZOLE SODIUM 40 MG PO TBEC
40.0000 mg | DELAYED_RELEASE_TABLET | Freq: Every day | ORAL | Status: DC
  Administered 2018-10-19 – 2018-10-26 (×8): 40 mg via ORAL
  Filled 2018-10-17 (×8): qty 1

## 2018-10-17 MED ORDER — SODIUM CHLORIDE 0.9% FLUSH
3.0000 mL | INTRAVENOUS | Status: DC | PRN
  Administered 2018-10-23: 3 mL via INTRAVENOUS
  Filled 2018-10-17: qty 3

## 2018-10-17 MED ORDER — HEMOSTATIC AGENTS (NO CHARGE) OPTIME
TOPICAL | Status: DC | PRN
  Administered 2018-10-17 (×2): 1 via TOPICAL

## 2018-10-17 MED ORDER — BISACODYL 10 MG RE SUPP: 10.0000 mg | Freq: Every day | RECTAL | Status: DC

## 2018-10-17 MED ORDER — PROPOFOL 10 MG/ML IV BOLUS
INTRAVENOUS | Status: DC | PRN
  Administered 2018-10-17: 70 mg via INTRAVENOUS

## 2018-10-17 MED ORDER — METOPROLOL TARTRATE 25 MG/10 ML ORAL SUSPENSION: 12.5000 mg | Freq: Two times a day (BID) | ORAL | Status: DC

## 2018-10-17 MED ORDER — HEPARIN SODIUM (PORCINE) 1000 UNIT/ML IJ SOLN
INTRAMUSCULAR | Status: AC
  Filled 2018-10-17: qty 1

## 2018-10-17 MED ORDER — LACTATED RINGERS IV SOLN
INTRAVENOUS | Status: DC | PRN
  Administered 2018-10-17 (×2): via INTRAVENOUS

## 2018-10-17 MED ORDER — ACETAMINOPHEN 650 MG RE SUPP
650.0000 mg | Freq: Once | RECTAL | Status: AC
  Administered 2018-10-17: 650 mg via RECTAL

## 2018-10-17 MED ORDER — FAMOTIDINE IN NACL 20-0.9 MG/50ML-% IV SOLN
20.0000 mg | Freq: Two times a day (BID) | INTRAVENOUS | Status: DC
  Administered 2018-10-17: 20 mg via INTRAVENOUS
  Filled 2018-10-17 (×2): qty 50

## 2018-10-17 MED ORDER — MAGNESIUM SULFATE 4 GM/100ML IV SOLN
4.0000 g | Freq: Once | INTRAVENOUS | Status: AC
  Administered 2018-10-17: 4 g via INTRAVENOUS
  Filled 2018-10-17: qty 100

## 2018-10-17 MED ORDER — HEPARIN SODIUM (PORCINE) 1000 UNIT/ML IJ SOLN
INTRAMUSCULAR | Status: DC | PRN
  Administered 2018-10-17: 32000 [IU] via INTRAVENOUS

## 2018-10-17 MED ORDER — SUCCINYLCHOLINE CHLORIDE 200 MG/10ML IV SOSY
PREFILLED_SYRINGE | INTRAVENOUS | Status: AC
  Filled 2018-10-17: qty 10

## 2018-10-17 MED ORDER — 0.9 % SODIUM CHLORIDE (POUR BTL) OPTIME
TOPICAL | Status: DC | PRN
  Administered 2018-10-17: 09:00:00 5000 mL

## 2018-10-17 MED ORDER — SODIUM CHLORIDE 0.9% FLUSH: 10.0000 mL | INTRAVENOUS | Status: DC | PRN

## 2018-10-17 MED ORDER — OXYCODONE HCL 5 MG PO TABS
5.0000 mg | ORAL_TABLET | ORAL | Status: DC | PRN
  Administered 2018-10-18 (×7): 10 mg via ORAL
  Administered 2018-10-19: 18:00:00 5 mg via ORAL
  Administered 2018-10-19 (×3): 10 mg via ORAL
  Administered 2018-10-21: 5 mg via ORAL
  Administered 2018-10-22 – 2018-10-25 (×20): 10 mg via ORAL
  Filled 2018-10-17: qty 1
  Filled 2018-10-17 (×21): qty 2
  Filled 2018-10-17: qty 1
  Filled 2018-10-17 (×9): qty 2

## 2018-10-17 MED ORDER — SODIUM CHLORIDE 0.9 % IV SOLN: INTRAVENOUS | Status: DC

## 2018-10-17 MED ORDER — PROTAMINE SULFATE 10 MG/ML IV SOLN
INTRAVENOUS | Status: DC | PRN
  Administered 2018-10-17: 20 mg via INTRAVENOUS
  Administered 2018-10-17: 300 mg via INTRAVENOUS

## 2018-10-17 MED ORDER — SODIUM CHLORIDE 0.9 % IV SOLN
INTRAVENOUS | Status: DC | PRN
  Administered 2018-10-17: 13:00:00 via INTRAVENOUS

## 2018-10-17 MED ORDER — NITROGLYCERIN IN D5W 200-5 MCG/ML-% IV SOLN: 0.0000 ug/min | INTRAVENOUS | Status: DC

## 2018-10-17 MED ORDER — POTASSIUM CHLORIDE 10 MEQ/50ML IV SOLN: 10.0000 meq | INTRAVENOUS | Status: AC

## 2018-10-17 MED ORDER — ONDANSETRON HCL 4 MG/2ML IJ SOLN
INTRAMUSCULAR | Status: DC | PRN
  Administered 2018-10-17: 4 mg via INTRAVENOUS

## 2018-10-17 MED ORDER — MIDAZOLAM HCL 2 MG/2ML IJ SOLN: 2.0000 mg | INTRAMUSCULAR | Status: DC | PRN

## 2018-10-17 MED ORDER — CHLORHEXIDINE GLUCONATE CLOTH 2 % EX PADS
6.0000 | MEDICATED_PAD | Freq: Every day | CUTANEOUS | Status: DC
  Administered 2018-10-17 – 2018-10-26 (×7): 6 via TOPICAL

## 2018-10-17 MED ORDER — BISACODYL 5 MG PO TBEC
10.0000 mg | DELAYED_RELEASE_TABLET | Freq: Every day | ORAL | Status: DC
  Administered 2018-10-18 – 2018-10-25 (×5): 10 mg via ORAL
  Filled 2018-10-17 (×8): qty 2

## 2018-10-17 MED ORDER — ROCURONIUM BROMIDE 10 MG/ML (PF) SYRINGE
PREFILLED_SYRINGE | INTRAVENOUS | Status: AC
  Filled 2018-10-17: qty 10

## 2018-10-17 MED ORDER — DEXAMETHASONE SODIUM PHOSPHATE 10 MG/ML IJ SOLN
INTRAMUSCULAR | Status: AC
  Filled 2018-10-17: qty 1

## 2018-10-17 MED ORDER — LACTATED RINGERS IV SOLN: 500.0000 mL | Freq: Once | INTRAVENOUS | Status: DC | PRN

## 2018-10-17 MED ORDER — ROCURONIUM BROMIDE 10 MG/ML (PF) SYRINGE
PREFILLED_SYRINGE | INTRAVENOUS | Status: AC
  Filled 2018-10-17: qty 20

## 2018-10-17 MED ORDER — MILRINONE LACTATE IN DEXTROSE 20-5 MG/100ML-% IV SOLN
0.1250 ug/kg/min | INTRAVENOUS | Status: DC
  Administered 2018-10-17 – 2018-10-18 (×3): 0.25 ug/kg/min via INTRAVENOUS
  Administered 2018-10-19 – 2018-10-21 (×2): 0.125 ug/kg/min via INTRAVENOUS
  Filled 2018-10-17 (×4): qty 100

## 2018-10-17 MED ORDER — METOPROLOL TARTRATE 12.5 MG HALF TABLET: 12.5000 mg | ORAL_TABLET | Freq: Two times a day (BID) | ORAL | Status: DC

## 2018-10-17 MED ORDER — ROCURONIUM BROMIDE 10 MG/ML (PF) SYRINGE
PREFILLED_SYRINGE | INTRAVENOUS | Status: DC | PRN
  Administered 2018-10-17: 50 mg via INTRAVENOUS
  Administered 2018-10-17: 100 mg via INTRAVENOUS
  Administered 2018-10-17 (×2): 50 mg via INTRAVENOUS

## 2018-10-17 MED ORDER — ARTIFICIAL TEARS OPHTHALMIC OINT
TOPICAL_OINTMENT | OPHTHALMIC | Status: DC | PRN
  Administered 2018-10-17: 1 via OPHTHALMIC

## 2018-10-17 MED ORDER — CHLORHEXIDINE GLUCONATE 0.12 % MT SOLN
15.0000 mL | OROMUCOSAL | Status: AC
  Administered 2018-10-17: 15 mL via OROMUCOSAL

## 2018-10-17 MED ORDER — INSULIN REGULAR(HUMAN) IN NACL 100-0.9 UT/100ML-% IV SOLN: INTRAVENOUS | Status: DC

## 2018-10-17 MED ORDER — CHLORHEXIDINE GLUCONATE 0.12% ORAL RINSE (MEDLINE KIT)
15.0000 mL | Freq: Two times a day (BID) | OROMUCOSAL | Status: DC
  Administered 2018-10-17: 15 mL via OROMUCOSAL

## 2018-10-17 MED ORDER — SODIUM CHLORIDE 0.9 % IV SOLN
1.5000 g | Freq: Two times a day (BID) | INTRAVENOUS | Status: AC
  Administered 2018-10-17 – 2018-10-19 (×4): 1.5 g via INTRAVENOUS
  Filled 2018-10-17 (×4): qty 1.5

## 2018-10-17 MED ORDER — PHENYLEPHRINE HCL-NACL 20-0.9 MG/250ML-% IV SOLN: 0.0000 ug/min | INTRAVENOUS | Status: DC

## 2018-10-17 MED ORDER — VANCOMYCIN HCL IN DEXTROSE 1-5 GM/200ML-% IV SOLN
1000.0000 mg | Freq: Once | INTRAVENOUS | Status: AC
  Administered 2018-10-17: 1000 mg via INTRAVENOUS
  Filled 2018-10-17: qty 200

## 2018-10-17 MED ORDER — MILRINONE LACTATE IN DEXTROSE 20-5 MG/100ML-% IV SOLN: 0.2500 ug/kg/min | INTRAVENOUS | Status: DC

## 2018-10-17 MED ORDER — ALBUMIN HUMAN 5 % IV SOLN
INTRAVENOUS | Status: DC | PRN
  Administered 2018-10-17 (×2): via INTRAVENOUS

## 2018-10-17 MED ORDER — ACETAMINOPHEN 160 MG/5ML PO SOLN: 650.0000 mg | Freq: Once | ORAL | Status: AC

## 2018-10-17 MED ORDER — FENTANYL CITRATE (PF) 250 MCG/5ML IJ SOLN
INTRAMUSCULAR | Status: DC | PRN
  Administered 2018-10-17: 100 ug via INTRAVENOUS
  Administered 2018-10-17: 250 ug via INTRAVENOUS
  Administered 2018-10-17: 500 ug via INTRAVENOUS
  Administered 2018-10-17: 150 ug via INTRAVENOUS
  Administered 2018-10-17: 50 ug via INTRAVENOUS
  Administered 2018-10-17: 150 ug via INTRAVENOUS
  Administered 2018-10-17: 50 ug via INTRAVENOUS

## 2018-10-17 MED ORDER — SODIUM CHLORIDE 0.9% FLUSH
10.0000 mL | Freq: Two times a day (BID) | INTRAVENOUS | Status: DC
  Administered 2018-10-17 – 2018-10-18 (×2): 10 mL

## 2018-10-17 MED ORDER — DOCUSATE SODIUM 100 MG PO CAPS
200.0000 mg | ORAL_CAPSULE | Freq: Every day | ORAL | Status: DC
  Administered 2018-10-18 – 2018-10-25 (×6): 200 mg via ORAL
  Filled 2018-10-17 (×8): qty 2

## 2018-10-17 MED ORDER — ALBUMIN HUMAN 5 % IV SOLN
250.0000 mL | INTRAVENOUS | Status: AC | PRN
  Administered 2018-10-17 (×4): 12.5 g via INTRAVENOUS
  Filled 2018-10-17: qty 500

## 2018-10-17 MED ORDER — LACTATED RINGERS IV SOLN
INTRAVENOUS | Status: DC
  Administered 2018-10-20: 17:00:00 via INTRAVENOUS

## 2018-10-17 MED ORDER — ACETAMINOPHEN 160 MG/5ML PO SOLN
1000.0000 mg | Freq: Four times a day (QID) | ORAL | Status: AC
  Filled 2018-10-17: qty 40.6

## 2018-10-17 MED ORDER — SODIUM CHLORIDE 0.45 % IV SOLN
INTRAVENOUS | Status: DC | PRN
  Administered 2018-10-17: 15:00:00 via INTRAVENOUS

## 2018-10-17 MED ORDER — SODIUM BICARBONATE 8.4 % IV SOLN
25.0000 meq | Freq: Once | INTRAVENOUS | Status: AC
  Administered 2018-10-17: 20:00:00 25 meq via INTRAVENOUS

## 2018-10-17 MED ORDER — ORAL CARE MOUTH RINSE
15.0000 mL | OROMUCOSAL | Status: DC
  Administered 2018-10-17 (×2): 15 mL via OROMUCOSAL

## 2018-10-17 MED ORDER — ASPIRIN 81 MG PO CHEW: 324.0000 mg | CHEWABLE_TABLET | Freq: Every day | ORAL | Status: DC

## 2018-10-17 MED ORDER — METOPROLOL TARTRATE 5 MG/5ML IV SOLN: 2.5000 mg | INTRAVENOUS | Status: DC | PRN

## 2018-10-17 MED ORDER — ACETAMINOPHEN 500 MG PO TABS
1000.0000 mg | ORAL_TABLET | Freq: Four times a day (QID) | ORAL | Status: AC
  Administered 2018-10-18 – 2018-10-22 (×19): 1000 mg via ORAL
  Filled 2018-10-17 (×19): qty 2

## 2018-10-17 MED ORDER — TRAMADOL HCL 50 MG PO TABS
50.0000 mg | ORAL_TABLET | ORAL | Status: DC | PRN
  Administered 2018-10-23: 100 mg via ORAL
  Filled 2018-10-17: qty 2

## 2018-10-17 MED ORDER — DEXAMETHASONE SODIUM PHOSPHATE 10 MG/ML IJ SOLN
INTRAMUSCULAR | Status: DC | PRN
  Administered 2018-10-17: 10 mg via INTRAVENOUS

## 2018-10-17 MED ORDER — SODIUM CHLORIDE (PF) 0.9 % IJ SOLN
OROMUCOSAL | Status: DC | PRN
  Administered 2018-10-17 (×3): 4 mL via TOPICAL

## 2018-10-17 MED ORDER — ASPIRIN EC 325 MG PO TBEC
325.0000 mg | DELAYED_RELEASE_TABLET | Freq: Every day | ORAL | Status: DC
  Administered 2018-10-18: 325 mg via ORAL
  Filled 2018-10-17: qty 1

## 2018-10-17 SURGICAL SUPPLY — 86 items
AGENT HMST KT MTR STRL THRMB (HEMOSTASIS) ×4
BAG DECANTER FOR FLEXI CONT (MISCELLANEOUS) ×5 IMPLANT
BANDAGE ACE 4X5 VEL STRL LF (GAUZE/BANDAGES/DRESSINGS) ×5 IMPLANT
BANDAGE ACE 6X5 VEL STRL LF (GAUZE/BANDAGES/DRESSINGS) ×5 IMPLANT
BANDAGE ELASTIC 4 VELCRO ST LF (GAUZE/BANDAGES/DRESSINGS) ×10 IMPLANT
BANDAGE ELASTIC 6 VELCRO ST LF (GAUZE/BANDAGES/DRESSINGS) ×5 IMPLANT
BLADE STERNUM SYSTEM 6 (BLADE) ×5 IMPLANT
BLADE SURG 11 STRL SS (BLADE) ×5 IMPLANT
BLADE SURG 15 STRL LF DISP TIS (BLADE) ×8 IMPLANT
BLADE SURG 15 STRL SS (BLADE) ×10
BNDG GAUZE ELAST 4 BULKY (GAUZE/BANDAGES/DRESSINGS) ×10 IMPLANT
CANISTER SUCT 3000ML PPV (MISCELLANEOUS) ×5 IMPLANT
CATH CPB KIT GERHARDT (MISCELLANEOUS) ×5 IMPLANT
CATH THORACIC 28FR (CATHETERS) ×5 IMPLANT
CLIP VESOCCLUDE MED 24/CT (CLIP) ×10 IMPLANT
CLIP VESOCCLUDE SM WIDE 24/CT (CLIP) ×10 IMPLANT
COUNTER NEEDLE 20 DBL MAG RED (NEEDLE) ×5 IMPLANT
COVER MAYO STAND STRL (DRAPES) ×5 IMPLANT
CRADLE DONUT ADULT HEAD (MISCELLANEOUS) ×5 IMPLANT
DRAIN CHANNEL 28F RND 3/8 FF (WOUND CARE) ×5 IMPLANT
DRAPE CARDIOVASCULAR INCISE (DRAPES) ×4
DRAPE SLUSH/WARMER DISC (DRAPES) ×5 IMPLANT
DRAPE SRG 135X102X78XABS (DRAPES) ×4 IMPLANT
DRSG AQUACEL AG ADV 3.5X14 (GAUZE/BANDAGES/DRESSINGS) ×5 IMPLANT
ELECT BLADE 4.0 EZ CLEAN MEGAD (MISCELLANEOUS) ×5
ELECT REM PT RETURN 9FT ADLT (ELECTROSURGICAL) ×10
ELECTRODE BLDE 4.0 EZ CLN MEGD (MISCELLANEOUS) ×4 IMPLANT
ELECTRODE REM PT RTRN 9FT ADLT (ELECTROSURGICAL) ×8 IMPLANT
FELT TEFLON 1X6 (MISCELLANEOUS) ×10 IMPLANT
GAUZE SPONGE 4X4 12PLY STRL (GAUZE/BANDAGES/DRESSINGS) ×10 IMPLANT
GAUZE SPONGE 4X4 12PLY STRL LF (GAUZE/BANDAGES/DRESSINGS) ×15 IMPLANT
GEL ULTRASOUND 8.5O AQUASONIC (MISCELLANEOUS) ×5 IMPLANT
GLOVE BIO SURGEON STRL SZ 6.5 (GLOVE) ×50 IMPLANT
GLOVE BIOGEL PI IND STRL 6 (GLOVE) ×4 IMPLANT
GLOVE BIOGEL PI IND STRL 6.5 (GLOVE) ×4 IMPLANT
GLOVE BIOGEL PI INDICATOR 6 (GLOVE) ×1
GLOVE BIOGEL PI INDICATOR 6.5 (GLOVE) ×1
GOWN STRL REUS W/ TWL LRG LVL3 (GOWN DISPOSABLE) ×16 IMPLANT
GOWN STRL REUS W/TWL LRG LVL3 (GOWN DISPOSABLE) ×20
HARMONIC SHEARS 14CM COAG (MISCELLANEOUS) ×5 IMPLANT
HEMOSTAT POWDER SURGIFOAM 1G (HEMOSTASIS) ×15 IMPLANT
HEMOSTAT SURGICEL 2X14 (HEMOSTASIS) ×5 IMPLANT
KIT BASIN OR (CUSTOM PROCEDURE TRAY) ×5 IMPLANT
KIT CATH SUCT 8FR (CATHETERS) ×5 IMPLANT
KIT SUCTION CATH 14FR (SUCTIONS) ×10 IMPLANT
KIT TURNOVER KIT B (KITS) ×5 IMPLANT
KIT VASOVIEW HEMOPRO 2 VH 4000 (KITS) ×5 IMPLANT
LEAD PACING MYOCARDI (MISCELLANEOUS) ×5 IMPLANT
LOOP VESSEL MAXI BLUE (MISCELLANEOUS) ×5 IMPLANT
MARKER GRAFT CORONARY BYPASS (MISCELLANEOUS) ×15 IMPLANT
NS IRRIG 1000ML POUR BTL (IV SOLUTION) ×25 IMPLANT
PACK E OPEN HEART (SUTURE) ×5 IMPLANT
PACK OPEN HEART (CUSTOM PROCEDURE TRAY) ×5 IMPLANT
PAD ARMBOARD 7.5X6 YLW CONV (MISCELLANEOUS) ×10 IMPLANT
PAD ELECT DEFIB RADIOL ZOLL (MISCELLANEOUS) ×5 IMPLANT
PENCIL BUTTON HOLSTER BLD 10FT (ELECTRODE) ×5 IMPLANT
PUNCH AORTIC ROTATE  4.5MM 8IN (MISCELLANEOUS) ×5 IMPLANT
SET CARDIOPLEGIA MPS 5001102 (MISCELLANEOUS) ×5 IMPLANT
SHEARS HARMONIC 9CM CVD (BLADE) ×5 IMPLANT
SOLUTION ANTI FOG 6CC (MISCELLANEOUS) ×5 IMPLANT
SPONGE LAP 18X18 RF (DISPOSABLE) ×25 IMPLANT
SURGIFLO W/THROMBIN 8M KIT (HEMOSTASIS) ×5 IMPLANT
SUT BONE WAX W31G (SUTURE) ×10 IMPLANT
SUT MNCRL AB 4-0 PS2 18 (SUTURE) ×15 IMPLANT
SUT PROLENE 3 0 SH1 36 (SUTURE) ×5 IMPLANT
SUT PROLENE 4 0 TF (SUTURE) ×15 IMPLANT
SUT PROLENE 6 0 C 1 30 (SUTURE) ×10 IMPLANT
SUT PROLENE 6 0 CC (SUTURE) ×10 IMPLANT
SUT PROLENE 7 0 BV 1 (SUTURE) ×30 IMPLANT
SUT PROLENE 7 0 BV1 MDA (SUTURE) ×5 IMPLANT
SUT PROLENE 7.0 RB 3 (SUTURE) ×10 IMPLANT
SUT PROLENE 8 0 BV175 6 (SUTURE) ×25 IMPLANT
SUT STEEL 6MS V (SUTURE) ×10 IMPLANT
SUT STEEL SZ 6 DBL 3X14 BALL (SUTURE) ×5 IMPLANT
SUT VIC AB 1 CTX 18 (SUTURE) ×10 IMPLANT
SUT VIC AB 2-0 CT1 27 (SUTURE) ×10
SUT VIC AB 2-0 CT1 TAPERPNT 27 (SUTURE) ×8 IMPLANT
SYSTEM SAHARA CHEST DRAIN ATS (WOUND CARE) ×5 IMPLANT
TAPE CLOTH SOFT 2X10 (GAUZE/BANDAGES/DRESSINGS) ×5 IMPLANT
TAPE CLOTH SURG 4X10 WHT LF (GAUZE/BANDAGES/DRESSINGS) ×5 IMPLANT
TOWEL GREEN STERILE (TOWEL DISPOSABLE) ×5 IMPLANT
TOWEL GREEN STERILE FF (TOWEL DISPOSABLE) ×5 IMPLANT
TRAY FOLEY SLVR 16FR TEMP STAT (SET/KITS/TRAYS/PACK) ×5 IMPLANT
TUBING LAP HI FLOW INSUFFLATIO (TUBING) ×5 IMPLANT
UNDERPAD 30X30 (UNDERPADS AND DIAPERS) ×5 IMPLANT
WATER STERILE IRR 1000ML POUR (IV SOLUTION) ×10 IMPLANT

## 2018-10-17 NOTE — Procedures (Signed)
Extubation Procedure Note  Patient Details:   Name: Danny Brewer DOB: 1971-02-03 MRN: [id number]   Airway Documentation:    Vent end date: (not recorded) Vent end time: (not recorded)   Evaluation  O2 sats: stable throughout Complications: No apparent complications Patient did tolerate procedure well. Bilateral Breath Sounds: Clear, Diminished   Yes  Benson Setting 10/17/2018, 9:39 PM

## 2018-10-17 NOTE — Anesthesia Procedure Notes (Signed)
Central Venous Catheter Insertion Performed by: Gaynelle Adu, MD, anesthesiologist Start/End5/04/2019 6:40 AM, 10/17/2018 6:55 AM Patient location: Pre-op. Preanesthetic checklist: patient identified, IV checked, site marked, risks and benefits discussed, surgical consent, monitors and equipment checked, pre-op evaluation, timeout performed and anesthesia consent Hand hygiene performed  and maximum sterile barriers used  PA cath was placed.Swan type:thermodilution PA Cath depth:50 Procedure performed without using ultrasound guided technique. Attempts: 1 Patient tolerated the procedure well with no immediate complications.

## 2018-10-17 NOTE — Anesthesia Procedure Notes (Signed)
Arterial Line Insertion Start/End5/04/2019 6:45 AM, 10/17/2018 7:00 AM Performed by: Elliot Dally, CRNA, CRNA  Patient location: Pre-op. Preanesthetic checklist: patient identified, IV checked, site marked, risks and benefits discussed, surgical consent, monitors and equipment checked, pre-op evaluation, timeout performed and anesthesia consent Patient sedated Right, radial was placed Catheter size: 20 G Hand hygiene performed , maximum sterile barriers used  and Seldinger technique used Allen's test indicative of satisfactory collateral circulation Attempts: 1 Procedure performed without using ultrasound guided technique. Following insertion, dressing applied and Biopatch. Post procedure assessment: normal  Patient tolerated the procedure well with no immediate complications.

## 2018-10-17 NOTE — Anesthesia Procedure Notes (Signed)
Procedure Name: Intubation Date/Time: 10/17/2018 7:44 AM Performed by: Elliot Dally, CRNA Pre-anesthesia Checklist: Patient identified, Emergency Drugs available, Suction available and Patient being monitored Patient Re-evaluated:Patient Re-evaluated prior to induction Oxygen Delivery Method: Circle System Utilized Preoxygenation: Pre-oxygenation with 100% oxygen Induction Type: IV induction Laryngoscope Size: Miller and 2 Grade View: Grade I Tube type: Oral Tube size: 8.0 mm Number of attempts: 1 Airway Equipment and Method: Stylet and Oral airway Placement Confirmation: ETT inserted through vocal cords under direct vision,  positive ETCO2 and breath sounds checked- equal and bilateral Secured at: 24 cm Tube secured with: Tape Dental Injury: Teeth and Oropharynx as per pre-operative assessment  Comments: Large epiglottis

## 2018-10-17 NOTE — Brief Op Note (Addendum)
      [address]       [place] [address]             [phone]     10/17/2018  11:48 AM  PATIENT:  Danny Brewer  48 y.o. male  PRE-OPERATIVE DIAGNOSIS:  CAD  POST-OPERATIVE DIAGNOSIS:  CAD  PROCEDURE:  TRANSESOPHAGEAL ECHOCARDIOGRAM (TEE), MEDIAN STERNOTOMY for CORONARY ARTERY BYPASS GRAFTING (CABG) x 4 (LIMA to LAD, SVG to DIAGONAL 2, LEFT RADIAL ARTERY to RAMUS INTERMEDIATE, and SVG to DISTAL RCA) with OPEN LEFT RADIAL ARTERY HARVEST and LEFT INTERNAL MAMMARY ARTERY HARVEST  SURGEON:  Surgeon(s) and Role:    [name], MD - Primary  PHYSICIAN ASSISTANT: Doree Fudge PA-C  ASSISTANTS: Virgilio Frees RNFA  ANESTHESIA:   general  EBL:  Per perfusion and anesthesia record  DRAINS: Chest tubes placed in the mediastinal and pleural spaces   COUNTS CORRECT:  YES  DICTATION: .Dragon Dictation  PLAN OF CARE: Admit to inpatient   PATIENT DISPOSITION:  ICU - intubated and hemodynamically stable.   Delay start of Pharmacological VTE agent (>24hrs) due to surgical blood loss or risk of bleeding: yes  BASELINE WEIGHT: 89.9 kg

## 2018-10-17 NOTE — Transfer of Care (Signed)
Immediate Anesthesia Transfer of Care Note  Patient: Danny Brewer  Procedure(s) Performed: CORONARY ARTERY BYPASS GRAFTING (CABG), ON PUMP, TIMES FOUR, USING LIMA TO LAD, ENDOSCOPICALLY HARVESTED GREATER SAPHENOUS VEIN TO DIAG 2, SVG TO DISTAL RCA, AND LEFT RADIAL TO RAMUS INTERMEDIUS (N/A Chest) RADIAL ARTERY HARVEST (Left ) TRANSESOPHAGEAL ECHOCARDIOGRAM (TEE) (N/A ) Endovein Harvest Of Greater Saphenous Vein (Right )  Patient Location: ICU  Anesthesia Type:General  Level of Consciousness: sedated and Patient remains intubated per anesthesia plan  Airway & Oxygen Therapy: Patient remains intubated per anesthesia plan and Patient placed on Ventilator (see vital sign flow sheet for setting)  Post-op Assessment: Report given to RN and Post -op Vital signs reviewed and stable  Post vital signs: Reviewed and stable  Last Vitals:  Vitals Value Taken Time  BP    Temp    Pulse    Resp 16 10/17/2018  2:31 PM  SpO2    Vitals shown include unvalidated device data.  Last Pain:  Vitals:   10/17/18 0556  TempSrc: Oral  PainSc:       Patients Stated Pain Goal: 0 (10/15/18 2145)  Complications: No apparent anesthesia complications

## 2018-10-17 NOTE — Anesthesia Procedure Notes (Signed)
Central Venous Catheter Insertion Performed by: Roderic Palau, MD, anesthesiologist Start/End5/04/2019 6:40 AM, 10/17/2018 6:55 AM Patient location: Pre-op. Preanesthetic checklist: patient identified, IV checked, site marked, risks and benefits discussed, surgical consent, monitors and equipment checked, pre-op evaluation, timeout performed and anesthesia consent Position: Trendelenburg Lidocaine 1% used for infiltration and patient sedated Hand hygiene performed , maximum sterile barriers used  and Seldinger technique used Catheter size: 9 Fr Total catheter length 10. Central line was placed.MAC introducer Procedure performed using ultrasound guided technique. Ultrasound Notes:anatomy identified, needle tip was noted to be adjacent to the nerve/plexus identified, no ultrasound evidence of intravascular and/or intraneural injection and image(s) printed for medical record Attempts: 1 Following insertion, line sutured, dressing applied and Biopatch. Post procedure assessment: blood return through all ports, free fluid flow and no air  Patient tolerated the procedure well with no immediate complications.

## 2018-10-17 NOTE — Progress Notes (Signed)
      [address]       [place] [address]             [phone]     Pre Procedure note for inpatients:   Patick Langell has been scheduled for Procedure(s): CORONARY ARTERY BYPASS GRAFTING (CABG) (N/A) RADIAL ARTERY HARVEST (Left) TRANSESOPHAGEAL ECHOCARDIOGRAM (TEE) (N/A) today. The various methods of treatment have been discussed with the patient. After consideration of the risks, benefits and treatment options the patient has consented to the planned procedure.   The patient has been seen and labs reviewed. There are no changes in the patient's condition to prevent proceeding with the planned procedure today.  Recent labs:  Lab Results  Component Value Date   WBC 6.1 10/17/2018   HGB 13.3 10/17/2018   HCT 40.9 10/17/2018   PLT 199 10/17/2018   GLUCOSE 106 (H) 10/17/2018   CHOL 198 09/19/2018   TRIG 67 09/19/2018   HDL 46 09/19/2018   LDLCALC 139 (H) 09/19/2018   ALT 24 10/16/2018   AST 19 10/16/2018   NA 137 10/17/2018   K 4.0 10/17/2018   CL 107 10/17/2018   CREATININE 1.10 10/17/2018   BUN 14 10/17/2018   CO2 25 10/17/2018   TSH 3.900 09/18/2018   PSA 0.46 06/19/2014   INR 1.0 10/16/2018   HGBA1C 6.3 (H) 10/16/2018   Summary: Right Carotid: Velocities in the right ICA are consistent with a 1-39% stenosis.  Left Carotid: Velocities in the left ICA are consistent with a 1-39% stenosis. Vertebrals:  Bilateral vertebral arteries demonstrate antegrade flow. Subclavians: Normal flow hemodynamics were seen in bilateral subclavian              arteries.  Right ABI: Elevated resting right ankle-brachial index indicates possible medial arterial calcification. Toe-brachial index is within normal limits. Left ABI: Resting left ankle-brachial index is within normal range. Toe-brachial index is elevated, suggestive of possible medial arterial calcification. Right Upper Extremity: Doppler waveforms remain within normal limits with right radial  compression. Doppler waveforms remain within normal limits with right ulnar compression. Left Upper Extremity: Doppler waveforms remain within normal limits with left radial compression. Doppler waveforms remain within normal limits with left ulnar compression.  The goals risks and alternatives of the planned surgical procedure coronary artery bypass grafting with possible left radial harvest have been discussed with the patient in detail. The risks of the procedure including death, infection, stroke, myocardial infarction, bleeding, blood transfusion have all been discussed specifically.  I have quoted [name] a 2 % of perioperative mortality and a complication rate as high as I called him yesterday 30 %. The patient's questions have been answered.Jaimin Plitt is willing  to proceed with the planned procedure.    [name], MD 10/17/2018 6:51 AM

## 2018-10-17 NOTE — Progress Notes (Signed)
Patient ID: Danny Brewer, male   DOB: November 29, 1970, 48 y.o.   MRN: [id number] EVENING ROUNDS NOTE :     [address]       [place] [address]             [phone]                 Day of Surgery Procedure(s) (LRB): CORONARY ARTERY BYPASS GRAFTING (CABG), ON PUMP, TIMES FOUR, USING LIMA TO LAD, ENDOSCOPICALLY HARVESTED GREATER SAPHENOUS VEIN TO DIAG 2, SVG TO DISTAL RCA, AND LEFT RADIAL TO RAMUS INTERMEDIUS (N/A) RADIAL ARTERY HARVEST (Left) TRANSESOPHAGEAL ECHOCARDIOGRAM (TEE) (N/A) Endovein Harvest Of Greater Saphenous Vein (Right)  Total Length of Stay:  LOS: 3 days  BP 128/77   Pulse 85   Temp 97.7 F (36.5 C)   Resp 12   Ht 5\' 9"  (1.753 m)   Wt 89.9 kg   SpO2 100%   BMI 29.27 kg/m   .Intake/Output      05/10 0701 - 05/11 0700 05/11 0701 - 05/12 0700   P.O. 480    I.V. (mL/kg)  3093.8 (34.4)   Blood  400   IV Piggyback  1706.1   Total Intake(mL/kg) 480 (5.3) 5199.9 (57.8)   Urine (mL/kg/hr)  2410 (2.3)   Blood  700   Chest Tube  209   Total Output  3319   Net +480 +1880.9          . sodium chloride 20 mL/hr at 10/17/18 1806  . [START ON 10/18/2018] sodium chloride    . sodium chloride 20 mL/hr at 10/17/18 1430  . cefUROXime (ZINACEF)  IV 200 mL/hr at 10/17/18 1600  . dexmedetomidine (PRECEDEX) IV infusion Stopped (10/17/18 1719)  . famotidine (PEPCID) IV Stopped (10/17/18 1526)  . insulin 1 mL/hr at 10/17/18 1806  . lactated ringers    . lactated ringers    . lactated ringers 20 mL/hr at 10/17/18 1806  . magnesium sulfate 20 mL/hr at 10/17/18 1806  . milrinone    . milrinone    . nitroGLYCERIN 7 mcg/min (10/17/18 1806)  . phenylephrine (NEO-SYNEPHRINE) Adult infusion 8 mcg/min (10/17/18 1806)  . vancomycin       Lab Results  Component Value Date   WBC 11.5 (H) 10/17/2018   HGB 10.5 (L) 10/17/2018   HCT 31.7 (L) 10/17/2018   PLT 114 (L) 10/17/2018   GLUCOSE 98 10/17/2018   CHOL 198 09/19/2018   TRIG 67 09/19/2018   HDL 46  09/19/2018   LDLCALC 139 (H) 09/19/2018   ALT 24 10/16/2018   AST 19 10/16/2018   NA 141 10/17/2018   K 3.7 10/17/2018   CL 107 10/17/2018   CREATININE 1.10 10/17/2018   BUN 14 10/17/2018   CO2 25 10/17/2018   TSH 3.900 09/18/2018   PSA 0.46 06/19/2014   INR 1.4 (H) 10/17/2018   HGBA1C 6.3 (H) 10/16/2018   Waking up ci 1.8 Good uop Start low does milrinone We aning vent total300 from chest tubes post op   [name] MD  Beeper [phone] Office [phone] 10/17/2018 6:52 PM

## 2018-10-18 ENCOUNTER — Inpatient Hospital Stay (HOSPITAL_COMMUNITY): Payer: BC Managed Care – PPO

## 2018-10-18 ENCOUNTER — Encounter (HOSPITAL_COMMUNITY): Payer: Self-pay | Admitting: Cardiothoracic Surgery

## 2018-10-18 LAB — BASIC METABOLIC PANEL
Anion gap: 12 (ref 5–15)
Anion gap: 7 (ref 5–15)
BUN: 13 mg/dL (ref 6–20)
BUN: 16 mg/dL (ref 6–20)
CO2: 21 mmol/L — ABNORMAL LOW (ref 22–32)
CO2: 16 mmol/L — ABNORMAL LOW (ref 22–32)
Calcium: 7.1 mg/dL — ABNORMAL LOW (ref 8.9–10.3)
Calcium: 5.7 mg/dL — CL (ref 8.9–10.3)
Chloride: 105 mmol/L (ref 98–111)
Chloride: 111 mmol/L (ref 98–111)
Creatinine, Ser: 1.07 mg/dL (ref 0.61–1.24)
Creatinine, Ser: 1.13 mg/dL (ref 0.61–1.24)
GFR calc Af Amer: >60 mL/min (ref >60)
GFR calc Af Amer: >60 mL/min (ref >60)
GFR calc non Af Amer: >60 mL/min (ref >60)
GFR calc non Af Amer: >60 mL/min (ref >60)
Glucose, Bld: 108 mg/dL — ABNORMAL HIGH (ref 70–99)
Glucose, Bld: 128 mg/dL — ABNORMAL HIGH (ref 70–99)
Potassium: 4 mmol/L (ref 3.5–5.1)
Potassium: 3.3 mmol/L — ABNORMAL LOW (ref 3.5–5.1)
Sodium: 138 mmol/L (ref 135–145)
Sodium: 134 mmol/L — ABNORMAL LOW (ref 135–145)

## 2018-10-18 LAB — CBC
HCT: 24.6 % — ABNORMAL LOW (ref 39.0–52.0)
HCT: 20.7 % — ABNORMAL LOW (ref 39.0–52.0)
HCT: 24.3 % — ABNORMAL LOW (ref 39.0–52.0)
Hemoglobin: 7.9 g/dL — ABNORMAL LOW (ref 13.0–17.0)
Hemoglobin: 6.5 g/dL — CL (ref 13.0–17.0)
Hemoglobin: 7.6 g/dL — ABNORMAL LOW (ref 13.0–17.0)
MCH: 27.3 pg (ref 26.0–34.0)
MCH: 27.3 pg (ref 26.0–34.0)
MCH: 27.1 pg (ref 26.0–34.0)
MCHC: 32.1 g/dL (ref 30.0–36.0)
MCHC: 31.4 g/dL (ref 30.0–36.0)
MCHC: 31.3 g/dL (ref 30.0–36.0)
MCV: 85.1 fL (ref 80.0–100.0)
MCV: 87 fL (ref 80.0–100.0)
MCV: 86.8 fL (ref 80.0–100.0)
Platelets: 98 10*3/uL — ABNORMAL LOW (ref 150–400)
Platelets: 76 10*3/uL — ABNORMAL LOW (ref 150–400)
Platelets: 90 10*3/uL — ABNORMAL LOW (ref 150–400)
RBC: 2.89 MIL/uL — ABNORMAL LOW (ref 4.22–5.81)
RBC: 2.38 MIL/uL — ABNORMAL LOW (ref 4.22–5.81)
RBC: 2.8 MIL/uL — ABNORMAL LOW (ref 4.22–5.81)
RDW: 13.2 % (ref 11.5–15.5)
RDW: 13.5 % (ref 11.5–15.5)
RDW: 13.4 % (ref 11.5–15.5)
WBC: 8.7 10*3/uL (ref 4.0–10.5)
WBC: 10.6 10*3/uL — ABNORMAL HIGH (ref 4.0–10.5)
WBC: 12.2 10*3/uL — ABNORMAL HIGH (ref 4.0–10.5)
nRBC: 0 % (ref 0.0–0.2)
nRBC: 0 % (ref 0.0–0.2)
nRBC: 0 % (ref 0.0–0.2)

## 2018-10-18 LAB — COMPREHENSIVE METABOLIC PANEL
ALT: 31 U/L (ref 0–44)
AST: 50 U/L — ABNORMAL HIGH (ref 15–41)
Albumin: 3.4 g/dL — ABNORMAL LOW (ref 3.5–5.0)
Alkaline Phosphatase: 30 U/L — ABNORMAL LOW (ref 38–126)
Anion gap: 9 (ref 5–15)
BUN: 19 mg/dL (ref 6–20)
CO2: 21 mmol/L — ABNORMAL LOW (ref 22–32)
Calcium: 7.1 mg/dL — ABNORMAL LOW (ref 8.9–10.3)
Chloride: 102 mmol/L (ref 98–111)
Creatinine, Ser: 1.41 mg/dL — ABNORMAL HIGH (ref 0.61–1.24)
GFR calc Af Amer: >60 mL/min (ref >60)
GFR calc non Af Amer: 58 mL/min — ABNORMAL LOW (ref >60)
Glucose, Bld: 145 mg/dL — ABNORMAL HIGH (ref 70–99)
Potassium: 4.1 mmol/L (ref 3.5–5.1)
Sodium: 132 mmol/L — ABNORMAL LOW (ref 135–145)
Total Bilirubin: 1.1 mg/dL (ref 0.3–1.2)
Total Protein: 5.1 g/dL — ABNORMAL LOW (ref 6.5–8.1)

## 2018-10-18 LAB — GLUCOSE, CAPILLARY
Glucose-Capillary: 105 mg/dL — ABNORMAL HIGH (ref 70–99)
Glucose-Capillary: 105 mg/dL — ABNORMAL HIGH (ref 70–99)
Glucose-Capillary: 106 mg/dL — ABNORMAL HIGH (ref 70–99)
Glucose-Capillary: 108 mg/dL — ABNORMAL HIGH (ref 70–99)
Glucose-Capillary: 109 mg/dL — ABNORMAL HIGH (ref 70–99)
Glucose-Capillary: 111 mg/dL — ABNORMAL HIGH (ref 70–99)
Glucose-Capillary: 112 mg/dL — ABNORMAL HIGH (ref 70–99)
Glucose-Capillary: 113 mg/dL — ABNORMAL HIGH (ref 70–99)
Glucose-Capillary: 114 mg/dL — ABNORMAL HIGH (ref 70–99)
Glucose-Capillary: 118 mg/dL — ABNORMAL HIGH (ref 70–99)
Glucose-Capillary: 123 mg/dL — ABNORMAL HIGH (ref 70–99)
Glucose-Capillary: 123 mg/dL — ABNORMAL HIGH (ref 70–99)
Glucose-Capillary: 167 mg/dL — ABNORMAL HIGH (ref 70–99)
Glucose-Capillary: 97 mg/dL (ref 70–99)

## 2018-10-18 LAB — COOXEMETRY PANEL
Carboxyhemoglobin: 1 % (ref 0.5–1.5)
Methemoglobin: 2.2 % — ABNORMAL HIGH (ref 0.0–1.5)
O2 Saturation: 53.7 %
Total hemoglobin: 8.1 g/dL — ABNORMAL LOW (ref 12.0–16.0)

## 2018-10-18 LAB — ECHO INTRAOPERATIVE TEE
Height: 69 in
Weight: 3171.1 oz

## 2018-10-18 LAB — MAGNESIUM
Magnesium: 2.4 mg/dL (ref 1.7–2.4)
Magnesium: 1.8 mg/dL (ref 1.7–2.4)
Magnesium: 2.3 mg/dL (ref 1.7–2.4)

## 2018-10-18 MED ORDER — METOPROLOL TARTRATE 25 MG PO TABS
25.0000 mg | ORAL_TABLET | Freq: Two times a day (BID) | ORAL | Status: DC
  Administered 2018-10-18 – 2018-10-26 (×16): 25 mg via ORAL
  Filled 2018-10-18 (×17): qty 1

## 2018-10-18 MED ORDER — ASPIRIN EC 81 MG PO TBEC
81.0000 mg | DELAYED_RELEASE_TABLET | Freq: Every day | ORAL | Status: DC
  Administered 2018-10-19 – 2018-10-26 (×7): 81 mg via ORAL
  Filled 2018-10-18 (×8): qty 1

## 2018-10-18 MED ORDER — INSULIN ASPART 100 UNIT/ML ~~LOC~~ SOLN
0.0000 [IU] | SUBCUTANEOUS | Status: DC
  Administered 2018-10-18 – 2018-10-19 (×3): 2 [IU] via SUBCUTANEOUS

## 2018-10-18 MED ORDER — ORAL CARE MOUTH RINSE
15.0000 mL | Freq: Two times a day (BID) | OROMUCOSAL | Status: DC
  Administered 2018-10-19 – 2018-10-26 (×6): 15 mL via OROMUCOSAL

## 2018-10-18 MED ORDER — FERROUS SULFATE 325 (65 FE) MG PO TABS
325.0000 mg | ORAL_TABLET | Freq: Every day | ORAL | Status: DC
  Administered 2018-10-19 – 2018-10-26 (×8): 325 mg via ORAL
  Filled 2018-10-18 (×8): qty 1

## 2018-10-18 MED ORDER — POTASSIUM CHLORIDE CRYS ER 20 MEQ PO TBCR
20.0000 meq | EXTENDED_RELEASE_TABLET | Freq: Once | ORAL | Status: AC
  Administered 2018-10-18: 08:00:00 20 meq via ORAL
  Filled 2018-10-18: qty 1

## 2018-10-18 MED ORDER — ISOSORBIDE MONONITRATE ER 30 MG PO TB24
15.0000 mg | ORAL_TABLET | Freq: Every day | ORAL | Status: DC
  Administered 2018-10-18: 15 mg via ORAL
  Filled 2018-10-18: qty 1

## 2018-10-18 MED ORDER — FUROSEMIDE 10 MG/ML IJ SOLN
20.0000 mg | Freq: Two times a day (BID) | INTRAMUSCULAR | Status: AC
  Administered 2018-10-18 (×2): 20 mg via INTRAVENOUS
  Filled 2018-10-18 (×2): qty 2

## 2018-10-18 MED ORDER — METOPROLOL TARTRATE 25 MG/10 ML ORAL SUSPENSION
25.0000 mg | Freq: Two times a day (BID) | ORAL | Status: DC
  Administered 2018-10-19: 25 mg
  Filled 2018-10-18 (×9): qty 10

## 2018-10-18 MED ORDER — KETOROLAC TROMETHAMINE 15 MG/ML IJ SOLN: 15.0000 mg | Freq: Four times a day (QID) | INTRAMUSCULAR | Status: DC | PRN

## 2018-10-18 MED ORDER — FOLIC ACID 1 MG PO TABS
1.0000 mg | ORAL_TABLET | Freq: Every day | ORAL | Status: DC
  Administered 2018-10-18 – 2018-10-26 (×9): 1 mg via ORAL
  Filled 2018-10-18 (×9): qty 1

## 2018-10-18 MED ORDER — ASPIRIN 81 MG PO CHEW
81.0000 mg | CHEWABLE_TABLET | Freq: Every day | ORAL | Status: DC
  Administered 2018-10-23: 81 mg
  Filled 2018-10-18: qty 1

## 2018-10-18 MED FILL — Magnesium Sulfate Inj 50%: INTRAMUSCULAR | Qty: 10 | Status: AC

## 2018-10-18 MED FILL — Heparin Sodium (Porcine) Inj 1000 Unit/ML: INTRAMUSCULAR | Qty: 30 | Status: AC

## 2018-10-18 MED FILL — Potassium Chloride Inj 2 mEq/ML: INTRAVENOUS | Qty: 40 | Status: AC

## 2018-10-18 NOTE — Progress Notes (Addendum)
[facility] DAILY ICU PROGRESS NOTE                   [address]            [place] [address]          [phone]   1 Day Post-Op Procedure(s) (LRB): CORONARY ARTERY BYPASS GRAFTING (CABG), ON PUMP, TIMES FOUR, USING LIMA TO LAD, ENDOSCOPICALLY HARVESTED GREATER SAPHENOUS VEIN TO DIAG 2, SVG TO DISTAL RCA, AND LEFT RADIAL TO RAMUS INTERMEDIUS (N/A) RADIAL ARTERY HARVEST (Left) TRANSESOPHAGEAL ECHOCARDIOGRAM (TEE) (N/A) Endovein Harvest Of Greater Saphenous Vein (Right)  Total Length of Stay:  LOS: 4 days   Subjective: Patient awake and alert this am. He is having incisional pain and burning.  Objective: Vital signs in last 24 hours: Temp:  [95.4 F (35.2 C)-100 F (37.8 C)] 99.9 F (37.7 C) (05/12 0700) Pulse Rate:  [74-115] 115 (05/12 0700) Cardiac Rhythm: Normal sinus rhythm (05/12 0400) Resp:  [0-37] 32 (05/12 0700) BP: (97-128)/(56-92) 123/73 (05/12 0700) SpO2:  [87 %-100 %] 87 % (05/12 0700) Arterial Line BP: (102-138)/(42-87) 123/46 (05/12 0700) FiO2 (%):  [40 %-50 %] 40 % (05/11 2058) Weight:  [95.3 kg] 95.3 kg (05/12 0429)  Filed Weights   10/15/18 0602 10/16/18 0442 10/18/18 0429  Weight: 89.9 kg 89.9 kg 95.3 kg    Weight change:    Hemodynamic parameters for last 24 hours: PAP: (17-44)/(3-24) 35/17 CO:  [2.4 L/min-5.9 L/min] 5.4 L/min CI:  [1.2 L/min/m2-2.9 L/min/m2] 2.6 L/min/m2  Intake/Output from previous day: 05/11 0701 - 05/12 0700 In: 6527.4 [P.O.:260; I.V.:3827.6; Blood:400; IV Piggyback:2039.9] Out: 4474 [Urine:2815; Blood:700; Chest Tube:959]  Intake/Output this shift: No intake/output data recorded.  Current Meds: Scheduled Meds:  acetaminophen  1,000 mg Oral Q6H   Or   acetaminophen (TYLENOL) oral liquid 160 mg/5 mL  1,000 mg Per Tube Q6H   aspirin EC  325 mg Oral Daily   Or   aspirin  324 mg Per Tube Daily   atorvastatin  40 mg Oral QHS   bisacodyl  10 mg Oral Daily   Or   bisacodyl  10 mg Rectal Daily    Chlorhexidine Gluconate Cloth  6 each Topical Daily   docusate sodium  200 mg Oral Daily   insulin regular  0-10 Units Intravenous TID WC   mouth rinse  15 mL Mouth Rinse BID   metoprolol tartrate  12.5 mg Oral BID   Or   metoprolol tartrate  12.5 mg Per Tube BID   [START ON 10/19/2018] pantoprazole  40 mg Oral Daily   sodium chloride flush  10-40 mL Intracatheter Q12H   sodium chloride flush  3 mL Intravenous Q12H   tamsulosin  0.4 mg Oral QHS   Continuous Infusions:  sodium chloride 20 mL/hr at 10/18/18 0700   sodium chloride     sodium chloride 20 mL/hr at 10/17/18 2100   cefUROXime (ZINACEF)  IV Stopped (10/18/18 0406)   dexmedetomidine (PRECEDEX) IV infusion Stopped (10/17/18 2048)   famotidine (PEPCID) IV Stopped (10/17/18 1526)   insulin 1.9 mL/hr at 10/18/18 0700   lactated ringers     lactated ringers     lactated ringers 20 mL/hr at 10/18/18 0700   milrinone 0.25 mcg/kg/min (10/18/18 0700)   nitroGLYCERIN 7 mcg/min (10/18/18 0700)   phenylephrine (NEO-SYNEPHRINE) Adult infusion Stopped (10/17/18 1847)   PRN Meds:.sodium chloride, fentaNYL (SUBLIMAZE) injection, lactated ringers, metoprolol tartrate, midazolam, ondansetron (ZOFRAN) IV, oxyCODONE, sodium chloride flush, sodium chloride flush,  traMADol  General appearance: alert, cooperative and no distress Neurologic: intact Heart: Tachycardic Lungs: Dimished basilar breath sounds Abdomen: Soft, non tender, sporadic bowel sounds present Extremities: Mild LE edema Wound: Aquacel intact on sternal wound. Left forearm dressing with minor bloody drainage;RLE dressing clean and dry  Lab Results: CBC: Recent Labs    10/17/18 1925  10/17/18 2236 10/18/18 0353  WBC 12.2*  --   --  8.7  HGB 9.3*   < > 8.2* 7.9*  HCT 29.0*   < > 24.0* 24.6*  PLT 117*  --   --  98*   < > = values in this interval not displayed.   BMET:  Recent Labs    10/17/18 0049  10/17/18 1350  10/17/18 1925  10/17/18 2236  10/18/18 0353  NA 137   < > 141   < >  --    < > 141 138  K 4.0   < > 3.7   < >  --    < > 4.2 4.0  CL 107  --   --   --   --   --   --  105  CO2 25  --   --   --   --   --   --  21*  GLUCOSE 106*   < > 98  --   --   --   --  108*  BUN 14  --   --   --   --   --   --  13  CREATININE 1.10  --   --   --  0.99  --   --  1.07  CALCIUM 8.3*  --   --   --   --   --   --  7.1*   < > = values in this interval not displayed.    CMET: Lab Results  Component Value Date   WBC 8.7 10/18/2018   HGB 7.9 (L) 10/18/2018   HCT 24.6 (L) 10/18/2018   PLT 98 (L) 10/18/2018   GLUCOSE 108 (H) 10/18/2018   CHOL 198 09/19/2018   TRIG 67 09/19/2018   HDL 46 09/19/2018   LDLCALC 139 (H) 09/19/2018   ALT 24 10/16/2018   AST 19 10/16/2018   NA 138 10/18/2018   K 4.0 10/18/2018   CL 105 10/18/2018   CREATININE 1.07 10/18/2018   BUN 13 10/18/2018   CO2 21 (L) 10/18/2018   TSH 3.900 09/18/2018   PSA 0.46 06/19/2014   INR 1.4 (H) 10/17/2018   HGBA1C 6.3 (H) 10/16/2018      PT/INR:  Recent Labs    10/17/18 1438  LABPROT 16.7*  INR 1.4*   Radiology: Dg Chest Port 1 View  Result Date: 10/18/2018 CLINICAL DATA:  48 year old male status post CABG postoperative day 1. EXAM: PORTABLE CHEST 1 VIEW COMPARISON:  10/17/2018 and earlier. FINDINGS: Portable AP semi upright view at 0510 hours. Extubated and enteric tube removed. Stable right IJ Swan-Ganz catheter. Left chest tube and mediastinal tube remain in place. Lower lung volumes with veiling and indistinct opacity at both lung bases. Stable cardiac size and mediastinal contours. No pneumothorax. Upper lung vascularity remains normal, no acute edema. Partially visible increased bowel gas in the left upper quadrant. Stable visualized osseous structures. IMPRESSION: 1. Extubated and enteric tube removed. Otherwise stable lines and tubes. No pneumothorax. 2. Lower lung volumes with bibasilar atelectasis and/or pleural effusions. Electronically Signed   By: Odessa Fleming M.D.   On:  10/18/2018 05:38   Dg Chest Port 1 View  Result Date: 10/17/2018 CLINICAL DATA:  [age] year old male status post 4 vessel CABG EXAM: PORTABLE CHEST 1 VIEW COMPARISON:  Prior chest x-ray 10/16/2018 FINDINGS: Patient is intubated. The tip of the endotracheal tube is 3.9 cm above the carina. A right IJ vascular sheath convey is a Swan-Ganz catheter into the heart. The tip of the Swan overlies the main pulmonary outflow tract. A gastric tube is present. The tip of the tube lies in the region of the gastroesophageal junction. Mediastinal and left-sided thoracostomy tubes are present and in good position. The cardiac and mediastinal contours are within normal limits. Patient is status post median sternotomy with evidence of multivessel CABG including LIMA bypass. No pulmonary edema, pneumothorax or pleural effusion. Mild atelectasis bilaterally. IMPRESSION: 1. Tip of the nasogastric tube overlies the GE junction. Consider advancing 5-7 cm for placement within the stomach. 2. The tip of the endotracheal tube is 3.9 cm above the carina. 3. The Swan-Ganz catheter overlies the main pulmonary outflow tract. 4. Mild bibasilar atelectasis without evidence of pulmonary edema, pneumothorax or pleural effusion. Electronically Signed   By: Malachy MoanHeath  McCullough M.D.   On: 10/17/2018 14:53     Assessment/Plan: S/P Procedure(s) (LRB): CORONARY ARTERY BYPASS GRAFTING (CABG), ON PUMP, TIMES FOUR, USING LIMA TO LAD, ENDOSCOPICALLY HARVESTED GREATER SAPHENOUS VEIN TO DIAG 2, SVG TO DISTAL RCA, AND LEFT RADIAL TO RAMUS INTERMEDIUS (N/A) RADIAL ARTERY HARVEST (Left) TRANSESOPHAGEAL ECHOCARDIOGRAM (TEE) (N/A) Endovein Harvest Of Greater Saphenous Vein (Right) 1. CV-ST in the low 100's. CO/CI 5.4/2.6. Co ox this am 53.7. On Milrinone 0.25 mcg/kg/min and Nitro 7 mcg/min drips. Likely wean off Nitro. Lopressor 12.5 mg bid and Imdur 15 mg daily also ordered for this am. Will increase Lopressor to 25 mg bid for better BP  and HR control. EKG being taken this am. 2. Pulmonary-On 2-3 liters of oxygen via [place]. Chest tubes with 959 since surgery. Chest tubes to remain for now. CXR this am shows no pneumothorax, bibasilar atelectasis. Encourage incentive spirometer. 3. Volume overload-will give Lasix 20 mg IV bid today 4. DM-CBGs 112/105/108. Wean off Insulin drip. He was on Sitagliptin prior to surgery and will restart once tolerating oral better. Pre op HGA1C 6.3 5. ABL anemia- H and H decreased to 7.9 and 24.6. Will start folic acid and Ferrous sulfate later today 6. Regarding pain control, will discuss with Dr. Tyrone SageGerhardt if ok to give a few doses of Toradol (creatinine 1.07) 7. Please see progression orders   Donielle Margaretann LovelessM Zimmerman PA-C 10/18/2018 7:11 AM   Sinus tachycardia this am, ci increased  Low dose milrinone, wean off nirto as start imdur  Leave chest tubes for now Low dose toradol for pain I have seen and examined Rozanna Boeralph Bulger and agree with the above assessment  and plan.  Delight OvensEdward B Taresa Montville MD Beeper [phone] Office [phone] 10/18/2018 7:47 AM

## 2018-10-18 NOTE — Anesthesia Postprocedure Evaluation (Signed)
Anesthesia Post Note  Patient: Danny Brewer  Procedure(s) Performed: CORONARY ARTERY BYPASS GRAFTING (CABG), ON PUMP, TIMES FOUR, USING LIMA TO LAD, ENDOSCOPICALLY HARVESTED GREATER SAPHENOUS VEIN TO DIAG 2, SVG TO DISTAL RCA, AND LEFT RADIAL TO RAMUS INTERMEDIUS (N/A Chest) RADIAL ARTERY HARVEST (Left ) TRANSESOPHAGEAL ECHOCARDIOGRAM (TEE) (N/A ) Endovein Harvest Of Greater Saphenous Vein (Right )     Patient location during evaluation: SICU Anesthesia Type: General Level of consciousness: sedated Pain management: pain level controlled Vital Signs Assessment: post-procedure vital signs reviewed and stable Respiratory status: patient remains intubated per anesthesia plan Cardiovascular status: stable Postop Assessment: no apparent nausea or vomiting Anesthetic complications: no    Last Vitals:  Vitals:   10/18/18 1300 10/18/18 1400  BP: 115/70 108/71  Pulse:    Resp: 16 (!) 27  Temp:    SpO2:      Last Pain:  Vitals:   10/18/18 0900  TempSrc:   PainSc: 8                  Dima Mini,W. EDMOND

## 2018-10-18 NOTE — Progress Notes (Signed)
CRITICAL VALUE ALERT  Critical Value:  Hemoglobin 6.5  Date & Time Notied:  10/18/2018 1645  Provider Notified: Denny Peon PA ([facility])  Orders Received/Actions taken: Provider stated they would put in orders for blood product

## 2018-10-18 NOTE — Progress Notes (Signed)
EVENING ROUNDS NOTE :     [address]       [place] [address]             [phone]                 1 Day Post-Op Procedure(s) (LRB): CORONARY ARTERY BYPASS GRAFTING (CABG), ON PUMP, TIMES FOUR, USING LIMA TO LAD, ENDOSCOPICALLY HARVESTED GREATER SAPHENOUS VEIN TO DIAG 2, SVG TO DISTAL RCA, AND LEFT RADIAL TO RAMUS INTERMEDIUS (N/A) RADIAL ARTERY HARVEST (Left) TRANSESOPHAGEAL ECHOCARDIOGRAM (TEE) (N/A) Endovein Harvest Of Greater Saphenous Vein (Right)  Total Length of Stay:  LOS: 4 days  BP 109/75   Pulse (!) 118   Temp 99.1 F (37.3 C)   Resp (!) 26   Ht 5\' 9"  (1.753 m)   Wt 95.3 kg   SpO2 91%   BMI 31.03 kg/m   .Intake/Output      05/11 0701 - 05/12 0700 05/12 0701 - 05/13 0700   P.O. 260 600   I.V. (mL/kg) 3827.6 (40.2) 343.9 (3.6)   Blood 400    IV Piggyback 2039.9    Total Intake(mL/kg) 6527.4 (68.5) 943.9 (9.9)   Urine (mL/kg/hr) 2845 (1.2) 360 (0.4)   Blood 700    Chest Tube 979 120   Total Output 4524 480   Net +2003.4 +463.9          . sodium chloride    . cefUROXime (ZINACEF)  IV 1.5 g (10/18/18 1609)  . lactated ringers    . lactated ringers    . lactated ringers 20 mL/hr at 10/18/18 1600  . milrinone 0.25 mcg/kg/min (10/18/18 1600)  . nitroGLYCERIN Stopped (10/18/18 1009)  . phenylephrine (NEO-SYNEPHRINE) Adult infusion Stopped (10/17/18 1847)     Lab Results  Component Value Date   WBC 10.6 (H) 10/18/2018   HGB 6.5 (LL) 10/18/2018   HCT 20.7 (L) 10/18/2018   PLT 76 (L) 10/18/2018   GLUCOSE 128 (H) 10/18/2018   CHOL 198 09/19/2018   TRIG 67 09/19/2018   HDL 46 09/19/2018   LDLCALC 139 (H) 09/19/2018   ALT 24 10/16/2018   AST 19 10/16/2018   NA 134 (L) 10/18/2018   K 3.3 (L) 10/18/2018   CL 111 10/18/2018   CREATININE 1.13 10/18/2018   BUN 16 10/18/2018   CO2 16 (L) 10/18/2018   TSH 3.900 09/18/2018   PSA 0.46 06/19/2014   INR 1.4 (H) 10/17/2018   HGBA1C 6.3 (H) 10/16/2018   H/h lower then this morning , ca  low -  Chest xray this afternoon no new  And or increased pleural effusion noted confirm labs are correct, if h/h low will transfuse Hr 115 Avoid heparin now with plts 77,000   [name] MD  Beeper [phone] Office [phone] 10/18/2018 4:58 PM

## 2018-10-18 NOTE — Progress Notes (Signed)
CT surgery p.m. Rounds  Patient sitting up in chair breathing comfortably, saturation 94% Sinus tachycardia Hemoglobin 7.5 on evening labs Patient on low-dose milrinone with adequate urine output

## 2018-10-18 NOTE — Op Note (Signed)
Danny Brewer, Danny Brewer MEDICAL RECORD WU:[id number] ACCOUNT [id number] DATE OF BIRTH:05-25-1971 FACILITY: [facility] LOCATION: [facility] PHYSICIAN:Ayven Pheasant Bari Makih Stefanko, MD  OPERATIVE REPORT  DATE OF PROCEDURE:  10/17/2018  PREOPERATIVE DIAGNOSIS:  Unstable angina with 3-vessel coronary artery disease.  POSTOPERATIVE DIAGNOSES:  Unstable angina with 3-vessel coronary artery disease.  SURGICAL PROCEDURE:  Coronary artery bypass grafting x4 with the left internal mammary to the left anterior descending coronary artery, left radial artery to the ramus intermedius, reverse saphenous vein graft to the second diagonal, reverse saphenous  vein graft to the distal right coronary artery with left radial artery harvest and endoscopic right greater saphenous thigh harvest.  SURGEON:  Sheliah Plane, MD  FIRST ASSISTANT:  Doree Fudge, PA.& Boris Lown, PA  BRIEF HISTORY:  The patient is a 48 year old diabetic male who notes several years of vague chest discomfort, but over the past months had noted a significant increase in substernal chest discomfort and shortness of breath with minimal exertion.   Sometimes these episodes would come on at rest.  Because of his symptoms, he had undergone cardiac catheterization by Dr. Verdis Prime that demonstrated significant 3-vessel coronary artery disease with high-grade stenosis of the mid right coronary  artery, a long segment.  In addition, he had a small nondominant circumflex, a large ramus branch that was the primary supplier of the lateral wall with 70% stenosis.  He had a long proximal and mid-LAD stenosis of 80% and a 90% stenosis of the second  diagonal.  Overall, ventricular function was preserved.  Because of this, an in cath lab urgent consult was done and after review of the films with Dr. Katrinka Blazing it was decided that with the patient's diabetic status severe 3-vessel disease, coronary artery  bypass grafting offered a better option than multiple stent  placement.  Risks and options were discussed with the patient and he was agreeable to proceed with coronary artery bypass grafting.    The patient was admitted, maintained on heparin and nitroglycerin for the next surgical opening.  Risks and options were discussed with the patient in detail.  The patient is right handed.  We discussed specifically use of the left radial artery harvest  and the potential for neurovascular injury to the hand.  DESCRIPTION OF PROCEDURE:  With Swan-Ganz and arterial line monitors in place, the patient underwent general endotracheal anesthesia without incident.  The skin of the chest, legs and left arm were prepped with Betadine, draped in a sterile manner.   Appropriate timeout was performed and then we proceeded with harvesting of the left radial artery.  Again, the Allen's test was tested.  We then performed a curvilinear incision across the volar aspect of the left arm.  Dissection was carried down to the  radial artery and the artery was dissected free with its pedicle of vein on each side with the Harmonic scalpel.  One scissor length of artery was obtained and dissected free.  A bulldog placed on the radial artery showed persistent ulnar flow and  palmar arch flow.  Simultaneously, a right thigh, a segment of greater saphenous vein was harvested endoscopically and was of excellent quality and caliber.  Median sternotomy was performed.  The left internal mammary artery was dissected down as a  pedicle graft.  The mammary artery was slightly small, but had excellent free flow.  The patient was then systemically heparinized.  The radial artery was divided and ligated proximally and distally.  A small cannula was placed in the radial artery.  The  patient's incision was then closed with running 2-0 Vicryl in the subcutaneous tissue and 3-0 subcuticular Monocryl suture.  Dressings were applied and the arm was then turned to the side of the body.  The patient's aorta was  cannulated.  The right  atrium was cannulated.  The patient was placed on cardiopulmonary bypass 2.4 liters per minute per meter square.  Sites of anastomosis were selected and dissected at the epicardium.  The patient's body temperature was cooled to 32 degrees.  Aortic  crossclamp was applied and 500 mL of cold blood potassium cardioplegia was administered with diastolic arrest of the heart.  Myocardial septal temperatures monitored throughout the crossclamp.  We first turned our attention to the distal right coronary  artery, which was opened, admitted 1.5 mm probe.  Using a running 7-0 Prolene a distal anastomosis was performed.  The heart was then elevated and the ramus branch, which was the primary supplier of the lateral wall, was opened.  The vessel was partially  intramyocardial.  A 1.5 probe passed distally without difficulty.  Using a running 8-0 Prolene,  the radial artery was anastomosed to the intermediate.  Additional cold blood cardioplegia was administered down the vein graft and down the radial artery.   We then turned our attention to the second diagonal vessel, which was relatively small, but a long diagonal.  The vessel was opened 1.2-1.3 mm in size  using a running 7-0 Prolene, distal anastomosis was performed with a segment of reverse saphenous  vein graft.  Attention was then turned to the left anterior descending coronary artery, which was opened and the midportion.  A 1.5 mm probe passed distally.  Using a running 8-0 Prolene, the left internal mammary artery was anastomosed to left anterior  descending coronary artery.  With cross clamp still in place, 2 punch aortotomies were performed in the ascending aorta and each of the 2 vein grafts were anastomosed to the ascending aorta.  Because of the size of the radial artery, it was not directly  anastomosed to the aorta, but to the hood of the graft to the intermediate vessel.  This was done with a running 7-0 Prolene.  Before the  anastomoses were completed, the heart was allowed to passively fill and deair.  The bulldog was removed from the  mammary artery with prompt rise in myocardial septal temperature.  Aortic crossclamp was then removed with total crossclamp time of 112 minutes.  The patient spontaneously converted to a sinus rhythm.  Sites of anastomosis were inspected and free of  bleeding.  The patient's body temperature was rewarmed to 37 degrees.  He was then ventilated and weaned from cardiopulmonary bypass without difficulty and remained hemodynamically stable.  TEE showed good LV function.  Previously, TEE showed good normal  valve function which persisted.  Total pump time was 150 minutes.  Atrial and ventricular pacing wires were applied.  Two graft markers were applied.  It was noted the hood of the radial graft arises from the hood of the vein graft to the second  diagonal.  The patient was decannulated in the usual fashion.  Protamine sulfate was administered with operative field hemostatic.  A left pleural tube and a Blake mediastinal drain were left in place.  Pericardium was loosely reapproximated.  Sternum  was closed with #6 stainless steel wire.  Fascia closed with interrupted 0 Vicryl, running 3-0 Vicryl, subcutaneous tissue, 4-0 subcuticular stitch in skin edges.  Dry dressings were applied.  Sponge and needle count  was reported as correct at completion  of procedure.    The patient tolerated the procedure without obvious complication and was transferred to the Surgical Intensive Care Unit for further postoperative care.  RF tag scanning reported negative reported clear code.  The patient did not require any blood bank  blood products during the operative procedure.  AN/NUANCE  D:10/18/2018 T:10/18/2018 JOB:[id number]

## 2018-10-19 ENCOUNTER — Inpatient Hospital Stay (HOSPITAL_COMMUNITY): Payer: BC Managed Care – PPO

## 2018-10-19 LAB — GLUCOSE, CAPILLARY
Glucose-Capillary: 130 mg/dL — ABNORMAL HIGH (ref 70–99)
Glucose-Capillary: 130 mg/dL — ABNORMAL HIGH (ref 70–99)
Glucose-Capillary: 120 mg/dL — ABNORMAL HIGH (ref 70–99)
Glucose-Capillary: 111 mg/dL — ABNORMAL HIGH (ref 70–99)
Glucose-Capillary: 109 mg/dL — ABNORMAL HIGH (ref 70–99)

## 2018-10-19 LAB — CBC
HCT: 21.7 % — ABNORMAL LOW (ref 39.0–52.0)
Hemoglobin: 7 g/dL — ABNORMAL LOW (ref 13.0–17.0)
MCH: 27.3 pg (ref 26.0–34.0)
MCHC: 32.3 g/dL (ref 30.0–36.0)
MCV: 84.8 fL (ref 80.0–100.0)
Platelets: 88 10*3/uL — ABNORMAL LOW (ref 150–400)
RBC: 2.56 MIL/uL — ABNORMAL LOW (ref 4.22–5.81)
RDW: 13.4 % (ref 11.5–15.5)
WBC: 13.7 10*3/uL — ABNORMAL HIGH (ref 4.0–10.5)
nRBC: 0 % (ref 0.0–0.2)

## 2018-10-19 LAB — BASIC METABOLIC PANEL
Anion gap: 7 (ref 5–15)
BUN: 27 mg/dL — ABNORMAL HIGH (ref 6–20)
CO2: 22 mmol/L (ref 22–32)
Calcium: 6.9 mg/dL — ABNORMAL LOW (ref 8.9–10.3)
Chloride: 104 mmol/L (ref 98–111)
Creatinine, Ser: 2 mg/dL — ABNORMAL HIGH (ref 0.61–1.24)
GFR calc Af Amer: 44 mL/min — ABNORMAL LOW (ref >60)
GFR calc non Af Amer: 38 mL/min — ABNORMAL LOW (ref >60)
Glucose, Bld: 153 mg/dL — ABNORMAL HIGH (ref 70–99)
Potassium: 4.6 mmol/L (ref 3.5–5.1)
Sodium: 133 mmol/L — ABNORMAL LOW (ref 135–145)

## 2018-10-19 LAB — COOXEMETRY PANEL
Carboxyhemoglobin: 1.4 % (ref 0.5–1.5)
Methemoglobin: 2.1 % — ABNORMAL HIGH (ref 0.0–1.5)
O2 Saturation: 59.8 %
Total hemoglobin: 7.2 g/dL — ABNORMAL LOW (ref 12.0–16.0)

## 2018-10-19 LAB — HEPARIN INDUCED PLATELET AB (HIT ANTIBODY): Heparin Induced Plt Ab: 0.074 OD (ref 0.000–0.400)

## 2018-10-19 LAB — CALCIUM, IONIZED: Calcium, Ionized, Serum: 3.8 mg/dL — ABNORMAL LOW (ref 4.5–5.6)

## 2018-10-19 MED ORDER — INSULIN ASPART 100 UNIT/ML ~~LOC~~ SOLN
0.0000 [IU] | Freq: Three times a day (TID) | SUBCUTANEOUS | Status: DC
  Administered 2018-10-22 – 2018-10-25 (×3): 2 [IU] via SUBCUTANEOUS

## 2018-10-19 MED ORDER — FUROSEMIDE 10 MG/ML IJ SOLN
20.0000 mg | Freq: Two times a day (BID) | INTRAMUSCULAR | Status: DC
  Administered 2018-10-19 – 2018-10-20 (×4): 20 mg via INTRAVENOUS
  Filled 2018-10-19 (×4): qty 2

## 2018-10-19 MED ORDER — ISOSORBIDE MONONITRATE ER 30 MG PO TB24
30.0000 mg | ORAL_TABLET | Freq: Every day | ORAL | Status: DC
  Administered 2018-10-19 – 2018-10-26 (×8): 30 mg via ORAL
  Filled 2018-10-19 (×8): qty 1

## 2018-10-19 NOTE — Progress Notes (Addendum)
[facility] DAILY ICU PROGRESS NOTE                   [address]            [place] [id number]          [phone]   2 Days Post-Op Procedure(s) (LRB): CORONARY ARTERY BYPASS GRAFTING (CABG), ON PUMP, TIMES FOUR, USING LIMA TO LAD, ENDOSCOPICALLY HARVESTED GREATER SAPHENOUS VEIN TO DIAG 2, SVG TO DISTAL RCA, AND LEFT RADIAL TO RAMUS INTERMEDIUS (N/A) RADIAL ARTERY HARVEST (Left) TRANSESOPHAGEAL ECHOCARDIOGRAM (TEE) (N/A) Endovein Harvest Of Greater Saphenous Vein (Right)  Total Length of Stay:  LOS: 5 days   Subjective: Patient sitting in chair this am. He states his hands feel less swollen.   Objective: Vital signs in last 24 hours: Temp:  [98.3 F (36.8 C)-99.7 F (37.6 C)] 99 F (37.2 C) (05/13 0309) Pulse Rate:  [98-122] 98 (05/13 0700) Cardiac Rhythm: Sinus tachycardia (05/13 0400) Resp:  [16-34] 16 (05/12 2300) BP: (89-125)/(55-85) 120/64 (05/13 0700) SpO2:  [82 %-100 %] 95 % (05/13 0700) Arterial Line BP: (109-133)/(43-49) 109/43 (05/12 1100) Weight:  [93.9 kg] 93.9 kg (05/13 0500)  Filed Weights   10/16/18 0442 10/18/18 0429 10/19/18 0500  Weight: 89.9 kg 95.3 kg 93.9 kg    Weight change: -1.4 kg   Hemodynamic parameters for last 24 hours: PAP: (28-38)/(16-23) 28/16  Intake/Output from previous day: 05/12 0701 - 05/13 0700 In: 1757.1 [P.O.:840; I.V.:717.1; IV Piggyback:200] Out: 1315 [Urine:915; Chest Tube:400]  Intake/Output this shift: No intake/output data recorded.  Current Meds: Scheduled Meds: . acetaminophen  1,000 mg Oral Q6H   Or  . acetaminophen (TYLENOL) oral liquid 160 mg/5 mL  1,000 mg Per Tube Q6H  . aspirin EC  81 mg Oral Daily   Or  . aspirin  81 mg Per Tube Daily  . atorvastatin  40 mg Oral QHS  . bisacodyl  10 mg Oral Daily   Or  . bisacodyl  10 mg Rectal Daily  . Chlorhexidine Gluconate Cloth  6 each Topical Daily  . docusate sodium  200 mg Oral Daily  . ferrous sulfate  325 mg Oral Q breakfast  . folic acid  1  mg Oral Daily  . insulin aspart  0-24 Units Subcutaneous Q4H  . isosorbide mononitrate  15 mg Oral Daily  . mouth rinse  15 mL Mouth Rinse BID  . metoprolol tartrate  25 mg Oral BID   Or  . metoprolol tartrate  25 mg Per Tube BID  . pantoprazole  40 mg Oral Daily  . tamsulosin  0.4 mg Oral QHS   Continuous Infusions: . sodium chloride    . lactated ringers    . lactated ringers    . lactated ringers 20 mL/hr at 10/19/18 0700  . milrinone 0.25 mcg/kg/min (10/19/18 0700)  . nitroGLYCERIN Stopped (10/18/18 1009)  . phenylephrine (NEO-SYNEPHRINE) Adult infusion Stopped (10/17/18 1847)   PRN Meds:.fentaNYL (SUBLIMAZE) injection, lactated ringers, metoprolol tartrate, midazolam, ondansetron (ZOFRAN) IV, oxyCODONE, sodium chloride flush, sodium chloride flush, traMADol  General appearance: alert, cooperative and no distress Neurologic: intact Heart: RRR Lungs: Dimished basilar breath sounds  Abdomen: Soft, non tender, sporadic bowel sounds present Extremities: Mild LE edema Wound: Aquacel intact on sternal wound. Left forearm dressing dry;RLE dressing clean and dry  Lab Results: CBC: Recent Labs    10/18/18 1706 10/19/18 0333  WBC 12.2* 13.7*  HGB 7.6* 7.0*  HCT 24.3* 21.7*  PLT 90* 88*  BMET:  Recent Labs    10/18/18 1706 10/19/18 0333  NA 132* 133*  K 4.1 4.6  CL 102 104  CO2 21* 22  GLUCOSE 145* 153*  BUN 19 27*  CREATININE 1.41* 2.00*  CALCIUM 7.1* 6.9*    CMET: Lab Results  Component Value Date   WBC 13.7 (H) 10/19/2018   HGB 7.0 (L) 10/19/2018   HCT 21.7 (L) 10/19/2018   PLT 88 (L) 10/19/2018   GLUCOSE 153 (H) 10/19/2018   CHOL 198 09/19/2018   TRIG 67 09/19/2018   HDL 46 09/19/2018   LDLCALC 139 (H) 09/19/2018   ALT 31 10/18/2018   AST 50 (H) 10/18/2018   NA 133 (L) 10/19/2018   K 4.6 10/19/2018   CL 104 10/19/2018   CREATININE 2.00 (H) 10/19/2018   BUN 27 (H) 10/19/2018   CO2 22 10/19/2018   TSH 3.900 09/18/2018   PSA 0.46 06/19/2014   INR  1.4 (H) 10/17/2018   HGBA1C 6.3 (H) 10/16/2018      PT/INR:  Recent Labs    10/17/18 1438  LABPROT 16.7*  INR 1.4*   Radiology: Dg Chest Port 1 View  Result Date: 10/19/2018 CLINICAL DATA:  48 year old male status post CABG postoperative day 2. EXAM: PORTABLE CHEST 1 VIEW COMPARISON:  10/18/2018 and earlier. FINDINGS: Portable AP semi upright view at 0531 hours. Mediastinal and left chest tube remain in place as well as the right IJ introducer sheath. Continued low lung volumes with veiling opacity at the lung bases and some retrocardiac air bronchograms. No pneumothorax or pulmonary edema. Stable cardiac size and mediastinal contours. Paucity of bowel gas in the upper abdomen. Stable visualized osseous structures. IMPRESSION: 1.  Stable lines and tubes.  No pneumothorax. 2. No pulmonary edema but confluent veiling opacity in both lungs suggesting combination of pleural effusion and lower lobe atelectasis/consolidation. Electronically Signed   By: Odessa Fleming M.D.   On: 10/19/2018 06:50   Dg Chest Port 1 View  Result Date: 10/18/2018 CLINICAL DATA:  Chest tube placement. EXAM: PORTABLE CHEST 1 VIEW COMPARISON:  Chest x-ray from same day at 5:10 a.m. FINDINGS: Interval removal of the Swan-Ganz catheter. The right internal jugular sheath remains in place. Unchanged mediastinal drain and left-sided chest tube. Stable cardiomediastinal silhouette status post CABG. Normal pulmonary vascularity. Persistent low lung volumes with unchanged hazy opacities at both lung bases likely reflecting a combination of atelectasis and small pleural effusions. No pneumothorax. No acute osseous abnormality. IMPRESSION: 1. Unchanged bibasilar atelectasis and small bilateral pleural effusions. 2. Interval removal of the Swan-Ganz catheter. Electronically Signed   By: Obie Dredge M.D.   On: 10/18/2018 17:22     Assessment/Plan: S/P Procedure(s) (LRB): CORONARY ARTERY BYPASS GRAFTING (CABG), ON PUMP, TIMES FOUR,  USING LIMA TO LAD, ENDOSCOPICALLY HARVESTED GREATER SAPHENOUS VEIN TO DIAG 2, SVG TO DISTAL RCA, AND LEFT RADIAL TO RAMUS INTERMEDIUS (N/A) RADIAL ARTERY HARVEST (Left) TRANSESOPHAGEAL ECHOCARDIOGRAM (TEE) (N/A) Endovein Harvest Of Greater Saphenous Vein (Right)   1. CV-SR in the 90's. On Milrinone 0.25 mcg/kg/min drip. Co ox this am increased to 59.8. Will wean Milrinone drip. Also, on Lopressor 25 mg bid and Imdur 15 mg daily. Will increase Imdur to 30 mg daily. 2. Pulmonary-On 2 liters of oxygen via [place]. Chest tubes with 400 last 24 hours. Remove mediastinal chest tube only this am. CXR this am shows no pneumothorax, bibasilar pleural effusions/atelectasis. Encourage incentive spirometer and flutter valve. 3. Volume overload-will give Lasix 20 mg IV bid today 4. DM-CBGs  123/109/130.  He was on Sitagliptin prior to surgery and will restart once tolerating oral better and creatinine improved. Pre op HGA1C 6.3 5. ABL anemia- H and H decreased to 7 and 21.7. Continue folic acid and start Ferrous sulfate later today. Monitor need for transfusion 6. Creatinine increased to 2-was given low dose diuretics and Toradol yesterday. NOT on ACE. Toradol stopped and will monitor creatinine. Will remove foley 7. Thrombocytopenia-platelets 88,000 this am. On ec asa, not heparin. HIT pending  Ardelle Balls PA-C 10/19/2018 7:09 AM   Ekg, early repolarization vs pericarditis Work on pulmonary toilet Decrease milrinine I have seen and examined [name] and agree with the above assessment  and plan.  [name] MD Beeper [phone] Office [phone] 10/19/2018 8:03 AM

## 2018-10-19 NOTE — Progress Notes (Signed)
      [address]       [place] [address]             [phone]      POD # 2 CABG x 4  Resting comfortably  BP 109/68   Pulse (!) 105   Temp 98.7 F (37.1 C) (Oral)   Resp 12   Ht 5\' 9"  (1.753 m)   Wt 93.9 kg   SpO2 100%   BMI 30.57 kg/m   Intake/Output Summary (Last 24 hours) at 10/19/2018 1650 Last data filed at 10/19/2018 1600 Gross per 24 hour  Intake 1386.79 ml  Output 1135 ml  Net 251.79 ml   UOP = 200 so far this shift  On milrinone 0.125 mcg/kg/min  Viviann Spare C. Dorris Fetch, MD [facility] [phone]

## 2018-10-19 NOTE — Discharge Summary (Signed)
Physician Discharge Summary       [address] [place].[address]       Danny Brewer [id number]             [phone]    Patient ID: Danny Brewer MRN: [id number] DOB/AGE: 1971/03/27 48 y.o.  Admit date: 10/14/2018 Discharge date: 10/26/2018  Admission Diagnoses: 1. Unstable angina (HCC) 2. Coronary artery disease  Discharge Diagnoses:  1. S/p CABG x 4 2. ABL anemia 3. Thrombocytopenia 4. History of  obstructive sleep apnea 5. History of diabetes mellitus 6. History of GERD (gastroesophageal reflux disease) 7. History of kidney stones    Procedure (s):  LEFT HEART CATH AND CORONARY ANGIOGRAPHY by Dr. Katrinka Blazing on 10/14/2018:  Conclusion    Diabetic with family history of premature coronary atherosclerosis.  Atypical and typical symptoms.  Recurring episodes of chest tightness at rest which is mimicked by left and right coronary contrast injections.  Symptoms in arm and neck are more continuous, positional, and not likely ischemic.  Severe two-vessel coronary disease with segmental 90 to 95% proximal to mid LAD, segmental 80% large first diagonal that supplies the distribution of the ramus intermedius or circumflex, segmental 80% stenosis in the moderate-sized second diagonal that is contained within the LAD stenosis as a bifurcation lesion, and segmental mid to distal.  The patient is right dominant.  Circumflex is small.  Normal LVEDP  RECOMMENDATIONS:   In room consultation with [facility] CV surgeon Dr. Sheliah Plane.  After discussion with patient, his wife, and surgeon, I have decided to admit the patient and he will undergo arterial grafting to the LAD, diagonal, and probable saphenous vein grafting to the right coronary on Monday.  Decided against PCI because of the requirement for long stents in all segments and anticipated decreased durability over time given his young age and diabetes.  IV nitroglycerin, therapeutic Lovenox.  Screening COVID testing.  Sleep study as OP.  Clinical history is strongly suggestive.    Coronary artery bypass grafting x4 with the left internal mammary to the left anterior descending coronary artery, left radial artery to the ramus intermedius, reverse saphenous vein graft to the second diagonal, reverse saphenous  vein graft to the distal right coronary artery with left radial artery harvest and endoscopic right greater saphenous thigh harvest by Dr. Tyrone Sage on 10/17/2018.  History of Presenting Illness: Danny Brewer is a very pleasant 48 yo AA male, with known history of hypertension, hyperlipidemia, GERD, and inguinal hernia repair.  He was evaluated by Dr. Excell Seltzer in April after he presented with complaints of chest pain.  Cardiac enzymes were negative at that time.  He also underwent Echocardiogram which showed a preserved EF with no motion abnormalities of valvular abnormalities.  It was felt his symptoms were likely musculoskeletal pain.  However, due to his risk factors he was set up for CTA which showed significant disease in the LAD, D1 and D2.  The scan was reviewed by Dr. Excell Seltzer who felt patient needed to undergo cardiac catheterization.  This was performed today and showed significant 3 vessel CAD.  Due to the extent of his disease and locations it was not felt PCI would be appropriate and coronary bypass grafting is being requested.  Currently the patient is chest pain free.  He states that he has been experiencing left arm and neck symptoms for the past 5 years.  Over the past month he has been having increasing episodes of substernal chest pain with exertion and at rest.  He notes increasing  shortness of breath with minimal activity as he is walking around at work.   He states the pain typically occurs across his left chest and radiates into his neck.  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  He denies  N/V, and lower extremity swelling.  He does not smoke.  He  does have known diabetes on oral agents previously had been on metformin but says his stomach would not tolerate this.  This is a 48 year old diabetic male who is had progressive anginal symptoms for the past month, now having substernal discomfort even at rest, also increasing shortness of breath with minimal exertion.  I have reviewed his cardiac cath films with Dr. Katrinka Blazing with the patient and his wife with complex three-vessel coronary artery disease underlying diabetes the best treatment option because of his unstable anginal symptoms his coronary artery bypass grafting.  The risks and options have been discussed with the patient and his wife in detail and he is willing to proceed with coronary artery bypass grafting.  Dr. Tyrone Sage discussed with him the possible use of left radial artery, with a specific risks of vascular or neuro injury to the left arm. He underwent a CABG x 4 on 10/17/2018.  Brief Hospital Course:  The patient was extubated the evening of surgery without difficulty. He remained afebrile and hemodynamically stable. He was weaned off Milrinone and Nitro drips. Theone Murdoch, a line, chest tubes, and foley were removed early in the post operative course. He had a fair amount of incisional pain. He was given a few doses of Toradol. His creatine increased so this was stopped. His creatine got as high as 2.69., but then gradually decreased. Last creatinine was down to 1.65 on 05/20. Lopressor was started and titrated accordingly. He was started on Imdur for left radial artery harvest. He was volume over loaded and diuresed. He had ABL anemia. He did require a post op transfusion on 05/14. Last H and H was up to 9.7 and 30. He was on folic acid and ferrous sulfate. He was weaned off the insulin drip.  Once he was tolerating a diet and creatinine normalized, he was restarted on Sitagliptin.  The patient's glucose remained well controlled.The patient's HGA1C pre op was 6.3. The patient was felt  surgically stable for transfer from the ICU to PCTU for further convalescence on 05/16. He continues to progress with cardiac rehab. He was ambulating on room air. He has been tolerating a diet. He had multiple bowel movements on 05/19. He is still a little volume overloaded so he will continue Lasix and low dose potassium for 5 days post op. Epicardial pacing wires were also removed on 05/19. Chest tube sutures will be removed today, the day of discharge. As discussed with Dr. Tyrone Sage, the patient is felt surgically stable for discharge today.    Latest Vital Signs: Blood pressure 130/89, pulse 100, temperature 98.3 F (36.8 C), temperature source Oral, resp. rate (!) 21, height 5\' 9"  (1.753 m), weight 88.4 kg, SpO2 (!) 80 %.  Physical Exam: Cardiovascular: RRR Pulmonary: Slightly diminished bases Abdomen: Soft, non tender, bowel sounds present. Extremities: Trace bilateral lower extremity edema. Left forearm with motor/sensory intact. Wounds: Clean and dry.  No erythema or signs of infection.   Discharge Condition: Stable and discharged to home.  Recent laboratory studies:  Lab Results  Component Value Date   WBC 9.8 10/25/2018   HGB 9.7 (L) 10/25/2018  HCT 30.0 (L) 10/25/2018   MCV 85.7 10/25/2018   PLT 363 10/25/2018   Lab Results  Component Value Date   NA 134 (L) 10/26/2018   K 4.3 10/26/2018   CL 103 10/26/2018   CO2 21 (L) 10/26/2018   CREATININE 1.65 (H) 10/26/2018   GLUCOSE 104 (H) 10/26/2018     Diagnostic Studies: Dg Chest 2 View  Result Date: 10/23/2018 CLINICAL DATA:  Status post CABG EXAM: CHEST - 2 VIEW COMPARISON:  10/22/2018 FINDINGS: Cardiomegaly.  Postsurgical changes related to prior CABG. Small layering right pleural effusion, improved.  No pneumothorax. Median sternotomy. IMPRESSION: Small layering right pleural effusion, improved. Postsurgical changes related to prior CABG. Electronically Signed   By: Charline Bills M.D.   On: 10/23/2018 06:21    Ct Coronary Morph W/cta Cor W/score W/ca W/cm &/or Wo/cm  Addendum Date: 10/06/2018   ADDENDUM REPORT: 10/06/2018 16:20 CLINICAL DATA:  Chest pain EXAM: Cardiac CTA MEDICATIONS: Sub lingual nitro.  x 2 and lopressor mg TECHNIQUE: The patient was scanned on a Siemens 192 slice scanner. Gantry rotation speed was 250 msecs. Collimation was 0.6 mm. A 100 kV prospective scan was triggered in the ascending thoracic aorta at 35-75% of the R-R interval. Average HR during the scan was bpm. The 3D data set was interpreted on a dedicated work station using MPR, MIP and VRT modes. A total of 80cc of contrast was used. FINDINGS: Non-cardiac: See separate report from [facility]. Pulmonary veins drain normally to the left atrium. Calcium Score: Coronary artery calcium score 448 Agatston units. Coronary Arteries: Right dominant with no anomalies LM: No plaque or stenosis. LAD system: Extensive mixed plaque with severe (70-90%) stenosis in the proximal LAD. D1 and D2 are small to moderate vessels and appear to have significant mixed plaque proximally (70-90% stenosis). Circumflex system: Relatively small vessel, no plaque or stenosis. RCA system: The mid RCA is obscured by motion artifact in all phases. However, there is mixed plaque in this area as well. Cannot rule out severe (70-90%) stenosis. IMPRESSION: 1. Coronary artery calcium score 448 Agatston units. This places the patient in the 98th percentile for age and gender, suggesting high risk of future cardiac events. 2. Suspected severe proximal LAD stenosis. There also appears to be significant disease in small-moderate D1 and D2. 3. Mid RCA is obscured by motion artifact but also has significant plaque. Possible severe (70-90%) stenosis. Will send for FFR. Dalton Mclean Electronically Signed   By: Marca Ancona M.D.   On: 10/06/2018 16:20   Result Date: 10/06/2018 EXAM: OVER-READ INTERPRETATION  CT CHEST The following report is an over-read performed by  radiologist Dr. Irish Lack of [facility] on 10/06/2018. This over-read does not include interpretation of cardiac or coronary anatomy or pathology. The coronary CTA interpretation by the cardiologist is attached. COMPARISON:  None. FINDINGS: Vascular: No incidental noncardiac vascular findings. Mediastinum/Nodes: Visualized mediastinum and hilar regions show no lymphadenopathy or masses. Lungs/Pleura: Visualized lungs show no evidence of pulmonary edema, consolidation, pneumothorax, nodule or pleural fluid. Upper Abdomen: No acute abnormality. Musculoskeletal: No chest wall mass or suspicious bone lesions identified. IMPRESSION: No significant incidental findings. Electronically Signed: By: Irish Lack M.D. On: 10/06/2018 10:06   Ct Coronary Fractional Flow Reserve Data Prep  Result Date: 10/07/2018 CLINICAL DATA:  Chest pain EXAM: CT FFR MEDICATIONS: No additional medications. TECHNIQUE: The coronary CTA was sent for FFR. FINDINGS: FFR 0.61 mid D1 FFR 0.73 mid D2 FFR 0.74 mid LAD and <0.5 distal LAD  Unable to interpret RCA due to artifact. No obstructive disease in LCx. IMPRESSION: Hemodynamically significant stenosis in the proximal LAD and proximal D1 and D2. Unable to interpret RCA due to artifact. Suggest cardiac cath. Dalton Mclean Electronically Signed   By: Marca Ancona M.D.   On: 10/07/2018 16:50   Vas US Doppler Pre Cabg  Result Date: 10/16/2018 PREOPERATIVE VASCULAR EVALUATION  Indications:  Pre-surgical evaluation. Risk Factors: Diabetes, coronary artery disease. Performing Technologist: Gertie Fey MHA, RDMS, RVT, RDCS Supporting Technologist: Toma Deiters RVS  Examination Guidelines: A complete evaluation includes B-mode imaging, spectral Doppler, color Doppler, and power Doppler as needed of all accessible portions of each vessel. Bilateral testing is considered an integral part of a complete examination. Limited examinations for reoccurring indications may  be performed as noted.  Right Carotid Findings: +----------+--------+--------+--------+-----------------------+--------+             PSV cm/s EDV cm/s Stenosis Describe                Comments  +----------+--------+--------+--------+-----------------------+--------+  CCA Prox   97       26                                                  +----------+--------+--------+--------+-----------------------+--------+  CCA Distal 84       35                smooth and heterogenous           +----------+--------+--------+--------+-----------------------+--------+  ICA Prox   71       26                smooth and heterogenous           +----------+--------+--------+--------+-----------------------+--------+  ICA Distal 74       35                                                  +----------+--------+--------+--------+-----------------------+--------+  ECA        69       14                smooth and heterogenous           +----------+--------+--------+--------+-----------------------+--------+ Portions of this table do not appear on this page. +----------+--------+-------+----------------+------------+             PSV cm/s EDV cms Describe         Arm Pressure  +----------+--------+-------+----------------+------------+  Subclavian 133              Multiphasic, WNL               +----------+--------+-------+----------------+------------+ +---------+--------+--+--------+--+---------+  Vertebral PSV cm/s 53 EDV cm/s 19 Antegrade  +---------+--------+--+--------+--+---------+ Left Carotid Findings: +----------+--------+--------+--------+-----------------------+--------+             PSV cm/s EDV cm/s Stenosis Describe                Comments  +----------+--------+--------+--------+-----------------------+--------+  CCA Prox   107      31                                                  +----------+--------+--------+--------+-----------------------+--------+  CCA Distal 102      25                smooth and heterogenous            +----------+--------+--------+--------+-----------------------+--------+  ICA Prox   47       16                smooth and heterogenous           +----------+--------+--------+--------+-----------------------+--------+  ICA Distal 139      38                                                  +----------+--------+--------+--------+-----------------------+--------+  ECA        83       20                                                  +----------+--------+--------+--------+-----------------------+--------+ +----------+--------+--------+----------------+------------+  Subclavian PSV cm/s EDV cm/s Describe         Arm Pressure  +----------+--------+--------+----------------+------------+             147               Multiphasic, WNL 111           +----------+--------+--------+----------------+------------+ +---------+--------+--+--------+--+---------+  Vertebral PSV cm/s 52 EDV cm/s 20 Antegrade  +---------+--------+--+--------+--+---------+  ABI Findings: +---------+------------------+-----+---------+--------+  Right     Rt Pressure (mmHg) Index Waveform  Comment   +---------+------------------+-----+---------+--------+  Brachial                           triphasic           +---------+------------------+-----+---------+--------+  PTA       118                1.06  triphasic           +---------+------------------+-----+---------+--------+  DP        151                1.36  triphasic           +---------+------------------+-----+---------+--------+  Great Toe 138                1.24  Normal              +---------+------------------+-----+---------+--------+ +---------+------------------+-----+---------+-------+  Left      Lt Pressure (mmHg) Index Waveform  Comment  +---------+------------------+-----+---------+-------+  Brachial  111                      triphasic          +---------+------------------+-----+---------+-------+  PTA       119                1.07  triphasic           +---------+------------------+-----+---------+-------+  DP        142                1.28  triphasic          +---------+------------------+-----+---------+-------+  Great Toe 195  1.76  Normal             +---------+------------------+-----+---------+-------+ +-------+---------------+----------------+  ABI/TBI Today's ABI/TBI Previous ABI/TBI  +-------+---------------+----------------+  Right   1.36/1.24                         +-------+---------------+----------------+  Left    1.28/1.76                         +-------+---------------+----------------+  Right Doppler Findings: +-----------+--------+-----+---------+--------------------+  Site        Pressure Index Doppler   Comments              +-----------+--------+-----+---------+--------------------+  Brachial                   triphasic                       +-----------+--------+-----+---------+--------------------+  Radial                     triphasic                       +-----------+--------+-----+---------+--------------------+  Ulnar                      triphasic                       +-----------+--------+-----+---------+--------------------+  Palmar Arch                          Within normal limits  +-----------+--------+-----+---------+--------------------+  Left Doppler Findings: +-----------+--------+-----+---------+--------------------+  Site        Pressure Index Doppler   Comments              +-----------+--------+-----+---------+--------------------+  Brachial    111            triphasic                       +-----------+--------+-----+---------+--------------------+  Radial                     triphasic                       +-----------+--------+-----+---------+--------------------+  Ulnar                      triphasic                       +-----------+--------+-----+---------+--------------------+  Palmar Arch                          Within normal limits  +-----------+--------+-----+---------+--------------------+   Summary: Right Carotid: Velocities in the right ICA are consistent with a 1-39% stenosis. Left Carotid: Velocities in the left ICA are consistent with a 1-39% stenosis. Vertebrals:  Bilateral vertebral arteries demonstrate antegrade flow. Subclavians: Normal flow hemodynamics were seen in bilateral subclavian              arteries. Right ABI: Elevated resting right ankle-brachial index indicates possible medial arterial calcification. Toe-brachial index is within normal limits. Left ABI: Resting left ankle-brachial index is within normal range. Toe-brachial index is elevated, suggestive of possible medial arterial calcification. Right Upper Extremity: Doppler waveforms remain within normal limits with right  radial compression. Doppler waveforms remain within normal limits with right ulnar compression. Left Upper Extremity: Doppler waveforms remain within normal limits with left radial compression. Doppler waveforms remain within normal limits with left ulnar compression.  Electronically signed by Coral Else MD on 10/16/2018 at 2:18:12 PM.    Final    Discharge Instructions    Amb Referral to Cardiac Rehabilitation   Complete by:  As directed    Diagnosis:  CABG   CABG X ___:  4   After initial evaluation and assessments completed: Virtual Based Care may be provided alone or in conjunction with Phase 2 Cardiac Rehab based on patient barriers.:  Yes      Discharge Medications: Allergies as of 10/26/2018      Reactions   Antihistamines, Diphenhydramine-type Hives, Other (See Comments)   Seizures   Demerol Hives   Morphine And Related Hives   Esp with high dose morphine, possibly Dilaudid Tolerates hydrocodone, oxycodone      Medication List    STOP taking these medications   lisinopril 10 MG tablet Commonly known as:  ZESTRIL   nitroGLYCERIN 0.4 MG SL tablet Commonly known as:  NITROSTAT     TAKE these medications   acetaminophen 500 MG tablet Commonly known as:  TYLENOL Take 1,000  mg by mouth daily as needed for moderate pain or headache.   aspirin 81 MG tablet Take 1 tablet (81 mg total) by mouth daily.   atorvastatin 40 MG tablet Commonly known as:  LIPITOR Take 40 mg by mouth at bedtime.   ferrous sulfate 325 (65 FE) MG tablet Take 1 tablet (325 mg total) by mouth daily with breakfast. For one month;if develops constipation, may stop sooner.   folic acid 1 MG tablet Commonly known as:  FOLVITE Take 1 tablet (1 mg total) by mouth daily. For one month then stop.   furosemide 40 MG tablet Commonly known as:  LASIX Take 1 tablet (40 mg total) by mouth daily. For 5 days then stop.   isosorbide mononitrate 30 MG 24 hr tablet Commonly known as:  IMDUR Take 1 tablet (30 mg total) by mouth daily. For one month then stop.   metoprolol succinate 50 MG 24 hr tablet Commonly known as:  Toprol XL Take 1 tablet (50 mg total) by mouth daily. What changed:    medication strength  how much to take   oxyCODONE 5 MG immediate release tablet Commonly known as:  Oxy IR/ROXICODONE Take 1 tablet (5 mg total) by mouth every 4 (four) hours as needed for severe pain.   potassium chloride 10 MEQ tablet Commonly known as:  K-DUR Take 1 tablet (10 mEq total) by mouth daily. For 5 days then stop.   sitaGLIPtin 100 MG tablet Commonly known as:  JANUVIA Take 1 tablet (100 mg total) by mouth at bedtime for 30 days.   tamsulosin 0.4 MG Caps capsule Commonly known as:  FLOMAX Take 1 capsule (0.4 mg total) by mouth at bedtime for 30 days.      The patient has been discharged on:   1.Beta Blocker:  Yes [x   ]                              No   [   ]  If No, reason:  2.Ace Inhibitor/ARB: Yes [   ]                                     No  [ x   ]                                     If No, reason: Elevated creatinine but will consider restarting Lisinopril once normalized as outpatient  3.Statin:   Yes [  x ]                  No  [   ]                   If No, reason:  4.Ecasa:  Yes  [ x  ]                  No   [   ]                  If No, reason:  Follow Up Appointments: Follow-up Information    [name], MD. Go on 11/28/2018.   Specialty:  Cardiothoracic Surgery Why:  PA/LAT CXR to be taken (at [facility] which is in the same building as Dr. Dennie Maizes office) on 06/22 at 12:30 pm;Appointment time is at 1:00 pm Contact information: [address] [place] [address] [phone]        Ileana Ladd, MD Follow up.   Specialty:  Family Medicine Why:  Please call for follow up appointment regarding further diabetes management Contact information: [address] [place] [address] [phone]        Tereso Newcomer T, PA-C Follow up on 11/01/2018.   Specialties:  Cardiology, Physician Assistant Why:  This is a virtual visit-please do NOT go to the office. Appointment time is at 8:15 am Contact information: [address] [place] [address] [phone]           Signed: Lelon Huh [facility] 10/26/2018, 9:26 AM

## 2018-10-20 ENCOUNTER — Inpatient Hospital Stay (HOSPITAL_COMMUNITY): Payer: BC Managed Care – PPO

## 2018-10-20 LAB — GLUCOSE, CAPILLARY
Glucose-Capillary: 109 mg/dL — ABNORMAL HIGH (ref 70–99)
Glucose-Capillary: 111 mg/dL — ABNORMAL HIGH (ref 70–99)
Glucose-Capillary: 116 mg/dL — ABNORMAL HIGH (ref 70–99)
Glucose-Capillary: 117 mg/dL — ABNORMAL HIGH (ref 70–99)
Glucose-Capillary: 117 mg/dL — ABNORMAL HIGH (ref 70–99)
Glucose-Capillary: 119 mg/dL — ABNORMAL HIGH (ref 70–99)
Glucose-Capillary: 120 mg/dL — ABNORMAL HIGH (ref 70–99)

## 2018-10-20 LAB — BASIC METABOLIC PANEL WITH GFR
Anion gap: 9 (ref 5–15)
BUN: 42 mg/dL — ABNORMAL HIGH (ref 6–20)
CO2: 24 mmol/L (ref 22–32)
Calcium: 7.4 mg/dL — ABNORMAL LOW (ref 8.9–10.3)
Chloride: 99 mmol/L (ref 98–111)
Creatinine, Ser: 2.69 mg/dL — ABNORMAL HIGH (ref 0.61–1.24)
GFR calc Af Amer: 31 mL/min — ABNORMAL LOW
GFR calc non Af Amer: 27 mL/min — ABNORMAL LOW
Glucose, Bld: 155 mg/dL — ABNORMAL HIGH (ref 70–99)
Potassium: 3.5 mmol/L (ref 3.5–5.1)
Sodium: 132 mmol/L — ABNORMAL LOW (ref 135–145)

## 2018-10-20 LAB — CBC
HCT: 18.6 % — ABNORMAL LOW (ref 39.0–52.0)
HCT: 21 % — ABNORMAL LOW (ref 39.0–52.0)
Hemoglobin: 5.9 g/dL — CL (ref 13.0–17.0)
Hemoglobin: 7 g/dL — ABNORMAL LOW (ref 13.0–17.0)
MCH: 26.9 pg (ref 26.0–34.0)
MCH: 28.1 pg (ref 26.0–34.0)
MCHC: 31.7 g/dL (ref 30.0–36.0)
MCHC: 33.3 g/dL (ref 30.0–36.0)
MCV: 84.9 fL (ref 80.0–100.0)
MCV: 84.3 fL (ref 80.0–100.0)
Platelets: 102 10*3/uL — ABNORMAL LOW (ref 150–400)
Platelets: 127 10*3/uL — ABNORMAL LOW (ref 150–400)
RBC: 2.19 MIL/uL — ABNORMAL LOW (ref 4.22–5.81)
RBC: 2.49 MIL/uL — ABNORMAL LOW (ref 4.22–5.81)
RDW: 13.4 % (ref 11.5–15.5)
RDW: 13.3 % (ref 11.5–15.5)
WBC: 12.3 10*3/uL — ABNORMAL HIGH (ref 4.0–10.5)
WBC: 11.6 10*3/uL — ABNORMAL HIGH (ref 4.0–10.5)
nRBC: 0.3 % — ABNORMAL HIGH (ref 0.0–0.2)
nRBC: 1.5 % — ABNORMAL HIGH (ref 0.0–0.2)

## 2018-10-20 LAB — BASIC METABOLIC PANEL
Anion gap: 9 (ref 5–15)
BUN: 41 mg/dL — ABNORMAL HIGH (ref 6–20)
CO2: 24 mmol/L (ref 22–32)
Calcium: 7.3 mg/dL — ABNORMAL LOW (ref 8.9–10.3)
Chloride: 98 mmol/L (ref 98–111)
Creatinine, Ser: 2.55 mg/dL — ABNORMAL HIGH (ref 0.61–1.24)
GFR calc Af Amer: 33 mL/min — ABNORMAL LOW (ref >60)
GFR calc non Af Amer: 28 mL/min — ABNORMAL LOW (ref >60)
Glucose, Bld: 129 mg/dL — ABNORMAL HIGH (ref 70–99)
Potassium: 4.1 mmol/L (ref 3.5–5.1)
Sodium: 131 mmol/L — ABNORMAL LOW (ref 135–145)

## 2018-10-20 LAB — COOXEMETRY PANEL
Carboxyhemoglobin: 1.1 % (ref 0.5–1.5)
Methemoglobin: 1.8 % — ABNORMAL HIGH (ref 0.0–1.5)
O2 Saturation: 50.8 %
Total hemoglobin: 7.3 g/dL — ABNORMAL LOW (ref 12.0–16.0)

## 2018-10-20 LAB — PREPARE RBC (CROSSMATCH)

## 2018-10-20 MED ORDER — POTASSIUM CHLORIDE 10 MEQ/50ML IV SOLN
10.0000 meq | Freq: Once | INTRAVENOUS | Status: AC
  Administered 2018-10-20: 10 meq via INTRAVENOUS
  Filled 2018-10-20: qty 50

## 2018-10-20 MED ORDER — SODIUM CHLORIDE 0.9% IV SOLUTION: Freq: Once | INTRAVENOUS | Status: DC

## 2018-10-20 MED ORDER — FUROSEMIDE 10 MG/ML IJ SOLN
40.0000 mg | Freq: Once | INTRAMUSCULAR | Status: AC
  Administered 2018-10-20: 40 mg via INTRAVENOUS
  Filled 2018-10-20: qty 4

## 2018-10-20 MED FILL — Electrolyte-R (PH 7.4) Solution: INTRAVENOUS | Qty: 3000 | Status: AC

## 2018-10-20 MED FILL — Lidocaine HCl(Cardiac) IV PF Soln Pref Syr 100 MG/5ML (2%): INTRAVENOUS | Qty: 5 | Status: AC

## 2018-10-20 MED FILL — Mannitol IV Soln 20%: INTRAVENOUS | Qty: 500 | Status: AC

## 2018-10-20 MED FILL — Sodium Bicarbonate IV Soln 8.4%: INTRAVENOUS | Qty: 50 | Status: AC

## 2018-10-20 MED FILL — Sodium Chloride IV Soln 0.9%: INTRAVENOUS | Qty: 2000 | Status: AC

## 2018-10-20 NOTE — Progress Notes (Signed)
Patient ID: Danny Brewer, male   DOB: August 27, 1970, 48 y.o.   MRN: [id number] EVENING ROUNDS NOTE :     [address]       [place] [address]             [phone]                 3 Days Post-Op Procedure(s) (LRB): CORONARY ARTERY BYPASS GRAFTING (CABG), ON PUMP, TIMES FOUR, USING LIMA TO LAD, ENDOSCOPICALLY HARVESTED GREATER SAPHENOUS VEIN TO DIAG 2, SVG TO DISTAL RCA, AND LEFT RADIAL TO RAMUS INTERMEDIUS (N/A) RADIAL ARTERY HARVEST (Left) TRANSESOPHAGEAL ECHOCARDIOGRAM (TEE) (N/A) Endovein Harvest Of Greater Saphenous Vein (Right)  Total Length of Stay:  LOS: 6 days  BP 117/65   Pulse (!) 102   Temp 98.8 F (37.1 C) (Oral)   Resp 17   Ht 5\' 9"  (1.753 m)   Wt 93.9 kg   SpO2 92%   BMI 30.57 kg/m   .Intake/Output      05/13 0701 - 05/14 0700 05/14 0701 - 05/15 0700   P.O. 360 500   I.V. (mL/kg) 538.3 (5.7) 207.5 (2.2)   Blood  279.2   IV Piggyback     Total Intake(mL/kg) 898.3 (9.6) 986.7 (10.5)   Urine (mL/kg/hr) 900 (0.4) 1200 (1.1)   Chest Tube 145    Total Output 1045 1200   Net -146.7 -213.3          . sodium chloride    . lactated ringers    . lactated ringers    . lactated ringers 10 mL/hr at 10/20/18 1712  . milrinone 0.125 mcg/kg/min (10/20/18 1500)  . nitroGLYCERIN Stopped (10/18/18 1009)  . phenylephrine (NEO-SYNEPHRINE) Adult infusion Stopped (10/20/18 1126)     Lab Results  Component Value Date   WBC 12.3 (H) 10/20/2018   HGB 5.9 (LL) 10/20/2018   HCT 18.6 (L) 10/20/2018   PLT 102 (L) 10/20/2018   GLUCOSE 129 (H) 10/20/2018   CHOL 198 09/19/2018   TRIG 67 09/19/2018   HDL 46 09/19/2018   LDLCALC 139 (H) 09/19/2018   ALT 31 10/18/2018   AST 50 (H) 10/18/2018   NA 131 (L) 10/20/2018   K 4.1 10/20/2018   CL 98 10/20/2018   CREATININE 2.55 (H) 10/20/2018   BUN 41 (H) 10/20/2018   CO2 24 10/20/2018   TSH 3.900 09/18/2018   PSA 0.46 06/19/2014   INR 1.4 (H) 10/17/2018   HGBA1C 6.3 (H) 10/16/2018    Feels better today ,  walked in unit hall, got one unit prbcs  [name] MD  Beeper [phone] Office [phone] 10/20/2018 6:48 PM

## 2018-10-20 NOTE — Progress Notes (Signed)
Patient ID: Danny Brewer, male   DOB: [birth year], 48 y.o.   MRN: [id number] TCTS DAILY ICU PROGRESS NOTE                   [address]            [place] [address]          [phone]   3 Days Post-Op Procedure(s) (LRB): CORONARY ARTERY BYPASS GRAFTING (CABG), ON PUMP, TIMES FOUR, USING LIMA TO LAD, ENDOSCOPICALLY HARVESTED GREATER SAPHENOUS VEIN TO DIAG 2, SVG TO DISTAL RCA, AND LEFT RADIAL TO RAMUS INTERMEDIUS (N/A) RADIAL ARTERY HARVEST (Left) TRANSESOPHAGEAL ECHOCARDIOGRAM (TEE) (N/A) Endovein Harvest Of Greater Saphenous Vein (Right)  Total Length of Stay:  LOS: 6 days   Subjective: Patient awake alert said he had trouble sleeping during the night  Objective: Vital signs in last 24 hours: Temp:  [98.1 F (36.7 C)-99.4 F (37.4 C)] 98.8 F (37.1 C) (05/14 1601) Pulse Rate:  [84-106] 105 (05/14 0930) Cardiac Rhythm: Sinus tachycardia (05/14 1200) Resp:  [12-28] 20 (05/14 1124) BP: (101-134)/(49-79) 108/69 (05/14 1124) SpO2:  [90 %-100 %] 100 % (05/14 0910)  Filed Weights   10/16/18 0442 10/18/18 0429 10/19/18 0500  Weight: 89.9 kg 95.3 kg 93.9 kg    Weight change:    Hemodynamic parameters for last 24 hours:    Intake/Output from previous day: 05/13 0701 - 05/14 0700 In: 898.3 [P.O.:360; I.V.:538.3] Out: 1045 [Urine:900; Chest Tube:145]  Intake/Output this shift: Total I/O In: 986.7 [P.O.:500; I.V.:207.5; Blood:279.2] Out: 900 [Urine:900]  Current Meds: Scheduled Meds: . sodium chloride   Intravenous Once  . sodium chloride   Intravenous Once  . sodium chloride   Intravenous Once  . acetaminophen  1,000 mg Oral Q6H   Or  . acetaminophen (TYLENOL) oral liquid 160 mg/5 mL  1,000 mg Per Tube Q6H  . aspirin EC  81 mg Oral Daily   Or  . aspirin  81 mg Per Tube Daily  . atorvastatin  40 mg Oral QHS  . bisacodyl  10 mg Oral Daily   Or  . bisacodyl  10 mg Rectal Daily  . Chlorhexidine Gluconate Cloth  6 each Topical Daily  . docusate  sodium  200 mg Oral Daily  . ferrous sulfate  325 mg Oral Q breakfast  . folic acid  1 mg Oral Daily  . furosemide  20 mg Intravenous BID  . insulin aspart  0-24 Units Subcutaneous TID AC & HS  . isosorbide mononitrate  30 mg Oral Daily  . mouth rinse  15 mL Mouth Rinse BID  . metoprolol tartrate  25 mg Oral BID   Or  . metoprolol tartrate  25 mg Per Tube BID  . pantoprazole  40 mg Oral Daily  . tamsulosin  0.4 mg Oral QHS   Continuous Infusions: . sodium chloride    . lactated ringers    . lactated ringers    . lactated ringers 20 mL/hr at 10/20/18 1500  . milrinone 0.125 mcg/kg/min (10/20/18 1500)  . nitroGLYCERIN Stopped (10/18/18 1009)  . phenylephrine (NEO-SYNEPHRINE) Adult infusion Stopped (10/20/18 1126)   PRN Meds:.fentaNYL (SUBLIMAZE) injection, lactated ringers, metoprolol tartrate, ondansetron (ZOFRAN) IV, oxyCODONE, sodium chloride flush, sodium chloride flush, traMADol  General appearance: alert and cooperative Neurologic: intact Heart: regular rate and rhythm, S1, S2 normal, no murmur, click, rub or gallop Lungs: diminished breath sounds bibasilar Abdomen: soft, non-tender; bowel sounds normal; no masses,  no organomegaly Extremities: extremities normal, atraumatic,  no cyanosis or edema and Homans sign is negative, no sign of DVT Wound: Sternum stable  Lab Results: CBC: Recent Labs    10/19/18 0333 10/20/18 0353  WBC 13.7* 12.3*  HGB 7.0* 5.9*  HCT 21.7* 18.6*  PLT 88* 102*   BMET:  Recent Labs    10/19/18 0333 10/20/18 0353  NA 133* 131*  K 4.6 4.1  CL 104 98  CO2 22 24  GLUCOSE 153* 129*  BUN 27* 41*  CREATININE 2.00* 2.55*  CALCIUM 6.9* 7.3*    CMET: Lab Results  Component Value Date   WBC 12.3 (H) 10/20/2018   HGB 5.9 (LL) 10/20/2018   HCT 18.6 (L) 10/20/2018   PLT 102 (L) 10/20/2018   GLUCOSE 129 (H) 10/20/2018   CHOL 198 09/19/2018   TRIG 67 09/19/2018   HDL 46 09/19/2018   LDLCALC 139 (H) 09/19/2018   ALT 31 10/18/2018   AST  50 (H) 10/18/2018   NA 131 (L) 10/20/2018   K 4.1 10/20/2018   CL 98 10/20/2018   CREATININE 2.55 (H) 10/20/2018   BUN 41 (H) 10/20/2018   CO2 24 10/20/2018   TSH 3.900 09/18/2018   PSA 0.46 06/19/2014   INR 1.4 (H) 10/17/2018   HGBA1C 6.3 (H) 10/16/2018      PT/INR:  No results for input(s): LABPROT, INR in the last 72 hours. Radiology: Dg Chest Port 1 View  Result Date: 10/20/2018 CLINICAL DATA:  Chest tube present. Pleural effusion. EXAM: PORTABLE CHEST 1 VIEW COMPARISON:  10/19/2018. FINDINGS: Continued worsening of RIGHT pleural effusion or hemothorax. Associated RIGHT lower lobe atelectasis. Cardiomegaly status post CABG. LEFT chest tube with no pneumothorax. IMPRESSION: Continued worsening of RIGHT pleural effusion versus hemothorax. Electronically Signed   By: Elsie StainJohn T Curnes M.D.   On: 10/20/2018 08:00     Assessment/Plan: S/P Procedure(s) (LRB): CORONARY ARTERY BYPASS GRAFTING (CABG), ON PUMP, TIMES FOUR, USING LIMA TO LAD, ENDOSCOPICALLY HARVESTED GREATER SAPHENOUS VEIN TO DIAG 2, SVG TO DISTAL RCA, AND LEFT RADIAL TO RAMUS INTERMEDIUS (N/A) RADIAL ARTERY HARVEST (Left) TRANSESOPHAGEAL ECHOCARDIOGRAM (TEE) (N/A) Endovein Harvest Of Greater Saphenous Vein (Right) Mobilize Diuresis Expected blood loss anemia hematocrit down further this morning to be transfused 1 unit of packed cells We will DC the left chest tube Monitor mild elevation  creatinine   Delight Ovensdward B Nicko Daher 10/20/2018 4:53 PM

## 2018-10-21 ENCOUNTER — Inpatient Hospital Stay (HOSPITAL_COMMUNITY): Payer: BC Managed Care – PPO

## 2018-10-21 LAB — CBC
HCT: 20.4 % — ABNORMAL LOW (ref 39.0–52.0)
Hemoglobin: 6.7 g/dL — CL (ref 13.0–17.0)
MCH: 27.7 pg (ref 26.0–34.0)
MCHC: 32.8 g/dL (ref 30.0–36.0)
MCV: 84.3 fL (ref 80.0–100.0)
Platelets: 136 10*3/uL — ABNORMAL LOW (ref 150–400)
RBC: 2.42 MIL/uL — ABNORMAL LOW (ref 4.22–5.81)
RDW: 13.4 % (ref 11.5–15.5)
WBC: 11.1 10*3/uL — ABNORMAL HIGH (ref 4.0–10.5)
nRBC: 3 % — ABNORMAL HIGH (ref 0.0–0.2)

## 2018-10-21 LAB — GLUCOSE, CAPILLARY
Glucose-Capillary: 118 mg/dL — ABNORMAL HIGH (ref 70–99)
Glucose-Capillary: 106 mg/dL — ABNORMAL HIGH (ref 70–99)
Glucose-Capillary: 103 mg/dL — ABNORMAL HIGH (ref 70–99)
Glucose-Capillary: 93 mg/dL (ref 70–99)

## 2018-10-21 LAB — BASIC METABOLIC PANEL
Anion gap: 6 (ref 5–15)
BUN: 39 mg/dL — ABNORMAL HIGH (ref 6–20)
CO2: 25 mmol/L (ref 22–32)
Calcium: 7.3 mg/dL — ABNORMAL LOW (ref 8.9–10.3)
Chloride: 102 mmol/L (ref 98–111)
Creatinine, Ser: 2.34 mg/dL — ABNORMAL HIGH (ref 0.61–1.24)
GFR calc Af Amer: 36 mL/min — ABNORMAL LOW (ref >60)
GFR calc non Af Amer: 31 mL/min — ABNORMAL LOW (ref >60)
Glucose, Bld: 124 mg/dL — ABNORMAL HIGH (ref 70–99)
Potassium: 3.6 mmol/L (ref 3.5–5.1)
Sodium: 133 mmol/L — ABNORMAL LOW (ref 135–145)

## 2018-10-21 LAB — COOXEMETRY PANEL
Carboxyhemoglobin: 1.7 % — ABNORMAL HIGH (ref 0.5–1.5)
Methemoglobin: 1.3 % (ref 0.0–1.5)
O2 Saturation: 65.2 %
Total hemoglobin: 7.1 g/dL — ABNORMAL LOW (ref 12.0–16.0)

## 2018-10-21 LAB — PREPARE RBC (CROSSMATCH)

## 2018-10-21 MED ORDER — FUROSEMIDE 10 MG/ML IJ SOLN
40.0000 mg | Freq: Once | INTRAMUSCULAR | Status: AC
  Administered 2018-10-21: 40 mg via INTRAVENOUS
  Filled 2018-10-21: qty 4

## 2018-10-21 MED ORDER — POTASSIUM CHLORIDE CRYS ER 20 MEQ PO TBCR
20.0000 meq | EXTENDED_RELEASE_TABLET | Freq: Two times a day (BID) | ORAL | Status: DC
  Administered 2018-10-21 – 2018-10-24 (×8): 20 meq via ORAL
  Filled 2018-10-21 (×8): qty 1

## 2018-10-21 NOTE — Progress Notes (Signed)
CT surgery p.m. Rounds  Patient sitting up in chair on room air Sinus rhythm Breath sounds clear Ambulated and had bowel movement today Milrinone is now off Urine output adequate-received Lasix this a.m.

## 2018-10-21 NOTE — Progress Notes (Addendum)
[address]       [place] [address]             [phone]      4 Days Post-Op Procedure(s) (LRB): CORONARY ARTERY BYPASS GRAFTING (CABG), ON PUMP, TIMES FOUR, USING LIMA TO LAD, ENDOSCOPICALLY HARVESTED GREATER SAPHENOUS VEIN TO DIAG 2, SVG TO DISTAL RCA, AND LEFT RADIAL TO RAMUS INTERMEDIUS (N/A) RADIAL ARTERY HARVEST (Left) TRANSESOPHAGEAL ECHOCARDIOGRAM (TEE) (N/A) Endovein Harvest Of Greater Saphenous Vein (Right) Subjective: Feels good this morning. Has a bowel movement and feels better.   Objective: Vital signs in last 24 hours: Temp:  [98.1 F (36.7 C)-99.3 F (37.4 C)] 98.2 F (36.8 C) (05/15 0739) Pulse Rate:  [89-110] 103 (05/15 0757) Cardiac Rhythm: Normal sinus rhythm (05/15 0400) Resp:  [13-30] 16 (05/15 0757) BP: (107-144)/(59-82) 144/67 (05/15 0757) SpO2:  [90 %-100 %] 94 % (05/15 0757) Weight:  [91.9 kg] 91.9 kg (05/15 0500)     Intake/Output from previous day: 05/14 0701 - 05/15 0700 In: 1604 [P.O.:750; I.V.:524.8; Blood:279.2; IV Piggyback:50.1] Out: 2350 [Urine:2350] Intake/Output this shift: No intake/output data recorded.  General appearance: alert, cooperative and no distress Heart: sinua tachycardia Lungs: clear to auscultation bilaterally Abdomen: soft, non-tender; bowel sounds normal; no masses,  no organomegaly Extremities: extremities normal, atraumatic, no cyanosis or edema Wound: clean and dry  Lab Results: Recent Labs    10/20/18 1851 10/21/18 0350  WBC 11.6* 11.1*  HGB 7.0* 6.7*  HCT 21.0* 20.4*  PLT 127* 136*   BMET:  Recent Labs    10/20/18 1851 10/21/18 0350  NA 132* 133*  K 3.5 3.6  CL 99 102  CO2 24 25  GLUCOSE 155* 124*  BUN 42* 39*  CREATININE 2.69* 2.34*  CALCIUM 7.4* 7.3*    PT/INR: No results for input(s): LABPROT, INR in the last 72 hours. ABG    Component Value Date/Time   PHART 7.331 (L) 10/17/2018 2236   HCO3 22.2 10/17/2018 2236   TCO2 23 10/17/2018 2236   ACIDBASEDEF  3.0 (H) 10/17/2018 2236   O2SAT 65.2 10/21/2018 0350   CBG (last 3)  Recent Labs    10/20/18 2017 10/20/18 2352 10/21/18 0410  GLUCAP 109* 116* 118*    Assessment/Plan: S/P Procedure(s) (LRB): CORONARY ARTERY BYPASS GRAFTING (CABG), ON PUMP, TIMES FOUR, USING LIMA TO LAD, ENDOSCOPICALLY HARVESTED GREATER SAPHENOUS VEIN TO DIAG 2, SVG TO DISTAL RCA, AND LEFT RADIAL TO RAMUS INTERMEDIUS (N/A) RADIAL ARTERY HARVEST (Left) TRANSESOPHAGEAL ECHOCARDIOGRAM (TEE) (N/A) Endovein Harvest Of Greater Saphenous Vein (Right)  1. CV-sinus tachycardia. Rate in the low 100s. BP well controlled. Continue ASA, statin, and lopressor. Continue Imdur  2. Pulm-tolerating room air with good oxygen saturation. CXR appears stable compared to yesterdays image. Continue incentive spirometer. Chest tubes out. 3. Renal-creatinine 2.34, potassium 3.6-replace. Lasix x 1 for fluid overload.  4. H and H 6.7/20.4, transfusing 1 unit pRBC this morning.  5. Endo-recent CBGs 109/116/118- well controlled  Plan: Getting second unit of blood now. He has some lasix and potassium scheduled. Encouraged to walk around the unit. Off drips and oxygen. Keep in ICU one more day to monitor hemoglobin.    LOS: 7 days    Danny Brewer 10/21/2018  hgb at 7.0, 2nd unit of blood given  Cr improving  Milrinone weaned off cox ths am was 65 D/c ij, has midline cath in place  I have seen and examined Danny Brewer and agree with the above assessment  and  plan.  Danny OvensEdward B Winson Eichorn MD Beeper [phone] Office [phone] 10/21/2018 11:39 AM

## 2018-10-22 ENCOUNTER — Inpatient Hospital Stay (HOSPITAL_COMMUNITY): Payer: BC Managed Care – PPO

## 2018-10-22 LAB — CBC
HCT: 26.1 % — ABNORMAL LOW (ref 39.0–52.0)
Hemoglobin: 8.8 g/dL — ABNORMAL LOW (ref 13.0–17.0)
MCH: 28.6 pg (ref 26.0–34.0)
MCHC: 33.7 g/dL (ref 30.0–36.0)
MCV: 84.7 fL (ref 80.0–100.0)
Platelets: 172 10*3/uL (ref 150–400)
RBC: 3.08 MIL/uL — ABNORMAL LOW (ref 4.22–5.81)
RDW: 13.9 % (ref 11.5–15.5)
WBC: 11.2 10*3/uL — ABNORMAL HIGH (ref 4.0–10.5)
nRBC: 5.3 % — ABNORMAL HIGH (ref 0.0–0.2)

## 2018-10-22 LAB — BASIC METABOLIC PANEL
Anion gap: 10 (ref 5–15)
BUN: 30 mg/dL — ABNORMAL HIGH (ref 6–20)
CO2: 22 mmol/L (ref 22–32)
Calcium: 7.7 mg/dL — ABNORMAL LOW (ref 8.9–10.3)
Chloride: 103 mmol/L (ref 98–111)
Creatinine, Ser: 1.97 mg/dL — ABNORMAL HIGH (ref 0.61–1.24)
GFR calc Af Amer: 45 mL/min — ABNORMAL LOW (ref >60)
GFR calc non Af Amer: 39 mL/min — ABNORMAL LOW (ref >60)
Glucose, Bld: 93 mg/dL (ref 70–99)
Potassium: 3.8 mmol/L (ref 3.5–5.1)
Sodium: 135 mmol/L (ref 135–145)

## 2018-10-22 LAB — GLUCOSE, CAPILLARY
Glucose-Capillary: 98 mg/dL (ref 70–99)
Glucose-Capillary: 129 mg/dL — ABNORMAL HIGH (ref 70–99)
Glucose-Capillary: 100 mg/dL — ABNORMAL HIGH (ref 70–99)
Glucose-Capillary: 166 mg/dL — ABNORMAL HIGH (ref 70–99)

## 2018-10-22 MED ORDER — FUROSEMIDE 10 MG/ML IJ SOLN
40.0000 mg | Freq: Every day | INTRAMUSCULAR | Status: DC
  Administered 2018-10-22 – 2018-10-23 (×2): 40 mg via INTRAVENOUS
  Filled 2018-10-22 (×2): qty 4

## 2018-10-22 NOTE — Progress Notes (Signed)
Cardiac Rehab Advisory Cardiac Rehab Phase I is not seeing pts face to face at this time due to Covid 19 restrictions. Ambulation is occurring through nursing, PT, and mobility teams. We will help facilitate that process as needed. We are calling pts in their rooms and discussing education. We will then deliver education materials to pts RN for delivery to pt.   [phone] [facility] education reviewed with patient via telephone including IS use, restrictions, sternal precautions, risk factor modification, heart healthy and diabetic diet given, and exercise guidelines. Pt verbalizes understanding of information given. Patient being transferred, will need to reiterate daily weights and discuss Phase 2 CR with patient. Phase 2 brochure left with patient.  Artist Pais, MS, ACSM CEP

## 2018-10-22 NOTE — Progress Notes (Signed)
5 Days Post-Op Procedure(s) (LRB): CORONARY ARTERY BYPASS GRAFTING (CABG), ON PUMP, TIMES FOUR, USING LIMA TO LAD, ENDOSCOPICALLY HARVESTED GREATER SAPHENOUS VEIN TO DIAG 2, SVG TO DISTAL RCA, AND LEFT RADIAL TO RAMUS INTERMEDIUS (N/A) RADIAL ARTERY HARVEST (Left) TRANSESOPHAGEAL ECHOCARDIOGRAM (TEE) (N/A) Endovein Harvest Of Greater Saphenous Vein (Right) Subjective: Patient feels well, had bowel movement and is walking in hallway Chest x-ray with improved edema and right pleural effusion Breathing comfortably on room air Ready for transfer to stepdown  Objective: Vital signs in last 24 hours: Temp:  [98.3 F (36.8 C)-99.3 F (37.4 C)] 98.5 F (36.9 C) (05/16 0700) Pulse Rate:  [92-100] 99 (05/16 1000) Cardiac Rhythm: Normal sinus rhythm;Sinus tachycardia (05/16 0800) Resp:  [14-33] 25 (05/16 1000) BP: (105-136)/(49-88) 136/71 (05/16 0940) SpO2:  [90 %-100 %] 99 % (05/16 1000) Weight:  [92.8 kg] 92.8 kg (05/16 0600)  Hemodynamic parameters for last 24 hours:  Stable  Intake/Output from previous day: 05/15 0701 - 05/16 0700 In: 1494.1 [P.O.:1080; I.V.:95.3; Blood:318.8] Out: 1375 [Urine:1375] Intake/Output this shift: Total I/O In: 360 [P.O.:360] Out: 100 [Urine:100]       Exam    General- alert and comfortable    Neck- no JVD, no cervical adenopathy palpable, no carotid bruit   Lungs- clear without rales, wheezes   Cor- regular rate and rhythm, no murmur , gallop   Abdomen- soft, non-tender   Extremities - warm, non-tender, minimal edema   Neuro- oriented, appropriate, no focal weakness   Lab Results: Recent Labs    10/21/18 0350 10/22/18 0422  WBC 11.1* 11.2*  HGB 6.7* 8.8*  HCT 20.4* 26.1*  PLT 136* 172   BMET:  Recent Labs    10/21/18 0350 10/22/18 0422  NA 133* 135  K 3.6 3.8  CL 102 103  CO2 25 22  GLUCOSE 124* 93  BUN 39* 30*  CREATININE 2.34* 1.97*  CALCIUM 7.3* 7.7*    PT/INR: No results for input(s): LABPROT, INR in the last 72  hours. ABG    Component Value Date/Time   PHART 7.331 (L) 10/17/2018 2236   HCO3 22.2 10/17/2018 2236   TCO2 23 10/17/2018 2236   ACIDBASEDEF 3.0 (H) 10/17/2018 2236   O2SAT 65.2 10/21/2018 0350   CBG (last 3)  Recent Labs    10/21/18 1613 10/21/18 2220 10/22/18 0701  GLUCAP 103* 93 98    Assessment/Plan: S/P Procedure(s) (LRB): CORONARY ARTERY BYPASS GRAFTING (CABG), ON PUMP, TIMES FOUR, USING LIMA TO LAD, ENDOSCOPICALLY HARVESTED GREATER SAPHENOUS VEIN TO DIAG 2, SVG TO DISTAL RCA, AND LEFT RADIAL TO RAMUS INTERMEDIUS (N/A) RADIAL ARTERY HARVEST (Left) TRANSESOPHAGEAL ECHOCARDIOGRAM (TEE) (N/A) Endovein Harvest Of Greater Saphenous Vein (Right) Continue current care Continue daily IV Lasix with creatinine improving and weight still up 4 pounds   LOS: 8 days    Danny Brewer 10/22/2018

## 2018-10-23 ENCOUNTER — Inpatient Hospital Stay (HOSPITAL_COMMUNITY): Payer: BC Managed Care – PPO

## 2018-10-23 LAB — GLUCOSE, CAPILLARY
Glucose-Capillary: 104 mg/dL — ABNORMAL HIGH (ref 70–99)
Glucose-Capillary: 92 mg/dL (ref 70–99)
Glucose-Capillary: 112 mg/dL — ABNORMAL HIGH (ref 70–99)
Glucose-Capillary: 135 mg/dL — ABNORMAL HIGH (ref 70–99)

## 2018-10-23 LAB — BPAM RBC
Blood Product Expiration Date: 202005152359
Blood Product Expiration Date: 202006022359
ISSUE DATE / TIME: 202005140855
ISSUE DATE / TIME: 202005150754
Unit Type and Rh: 5100
Unit Type and Rh: 9500

## 2018-10-23 LAB — CBC
HCT: 32.5 % — ABNORMAL LOW (ref 39.0–52.0)
Hemoglobin: 10.6 g/dL — ABNORMAL LOW (ref 13.0–17.0)
MCH: 27.9 pg (ref 26.0–34.0)
MCHC: 32.6 g/dL (ref 30.0–36.0)
MCV: 85.5 fL (ref 80.0–100.0)
Platelets: 265 10*3/uL (ref 150–400)
RBC: 3.8 MIL/uL — ABNORMAL LOW (ref 4.22–5.81)
RDW: 14 % (ref 11.5–15.5)
WBC: 11.8 10*3/uL — ABNORMAL HIGH (ref 4.0–10.5)
nRBC: 2.5 % — ABNORMAL HIGH (ref 0.0–0.2)

## 2018-10-23 LAB — TYPE AND SCREEN
ABO/RH(D): O POS
Antibody Screen: NEGATIVE
Unit division: 0
Unit division: 0

## 2018-10-23 LAB — BASIC METABOLIC PANEL
Anion gap: 14 (ref 5–15)
BUN: 25 mg/dL — ABNORMAL HIGH (ref 6–20)
CO2: 23 mmol/L (ref 22–32)
Calcium: 8.4 mg/dL — ABNORMAL LOW (ref 8.9–10.3)
Chloride: 100 mmol/L (ref 98–111)
Creatinine, Ser: 1.78 mg/dL — ABNORMAL HIGH (ref 0.61–1.24)
GFR calc Af Amer: 51 mL/min — ABNORMAL LOW (ref >60)
GFR calc non Af Amer: 44 mL/min — ABNORMAL LOW (ref >60)
Glucose, Bld: 94 mg/dL (ref 70–99)
Potassium: 4.1 mmol/L (ref 3.5–5.1)
Sodium: 137 mmol/L (ref 135–145)

## 2018-10-23 MED ORDER — FUROSEMIDE 40 MG PO TABS
40.0000 mg | ORAL_TABLET | Freq: Every day | ORAL | Status: DC
  Administered 2018-10-23 – 2018-10-26 (×4): 40 mg via ORAL
  Filled 2018-10-23 (×4): qty 1

## 2018-10-23 NOTE — Progress Notes (Addendum)
6 Days Post-Op Procedure(s) (LRB): CORONARY ARTERY BYPASS GRAFTING (CABG), ON PUMP, TIMES FOUR, USING LIMA TO LAD, ENDOSCOPICALLY HARVESTED GREATER SAPHENOUS VEIN TO DIAG 2, SVG TO DISTAL RCA, AND LEFT RADIAL TO RAMUS INTERMEDIUS (N/A) RADIAL ARTERY HARVEST (Left) TRANSESOPHAGEAL ECHOCARDIOGRAM (TEE) (N/A) Endovein Harvest Of Greater Saphenous Vein (Right) Subjective: Awake and alert, resting in bed after walking over 400 feet earlier this morning.  Says he is "having a chill" right now.  No other complaints.  Denies cough or dysuria.   Objective: Vital signs in last 24 hours: Temp:  [98.2 F (36.8 C)-99.6 F (37.6 C)] 99.2 F (37.3 C) (05/17 0800) Pulse Rate:  [91-106] 106 (05/17 0911) Cardiac Rhythm: Normal sinus rhythm (05/17 0700) Resp:  [17-26] 19 (05/17 0800) BP: (123-140)/(71-87) 129/76 (05/17 0800) SpO2:  [91 %-99 %] 98 % (05/17 0800) Weight:  [91.9 kg] 91.9 kg (05/17 0613)   Intake/Output from previous day: 05/16 0701 - 05/17 0700 In: 720 [P.O.:720] Out: 2050 [Urine:2050] Intake/Output this shift: Total I/O In: 120 [P.O.:120] Out: 100 [Urine:100]  General appearance: alert, cooperative and Shivering while lying supine in bed with several blankets over him. Neurologic: intact Heart: Sinus tachycardia with heart rate of 104-110.  He has occasional coupled PVCs. Lungs: Breath sounds are clear.  Chest x-ray was also reviewed and shows clear lung fields trace bilateral effusions. Abdomen: Soft and nontender Extremities: Very mild peripheral edema Wound: The sternotomy incision, chest tube site, and left lower extremity EVH harvest incision are all intact, well approximated, and showing no evidence of complication.  Lab Results: Recent Labs    10/21/18 0350 10/22/18 0422  WBC 11.1* 11.2*  HGB 6.7* 8.8*  HCT 20.4* 26.1*  PLT 136* 172   BMET:  Recent Labs    10/21/18 0350 10/22/18 0422  NA 133* 135  K 3.6 3.8  CL 102 103  CO2 25 22  GLUCOSE 124* 93  BUN  39* 30*  CREATININE 2.34* 1.97*  CALCIUM 7.3* 7.7*    PT/INR: No results for input(s): LABPROT, INR in the last 72 hours. ABG    Component Value Date/Time   PHART 7.331 (L) 10/17/2018 2236   HCO3 22.2 10/17/2018 2236   TCO2 23 10/17/2018 2236   ACIDBASEDEF 3.0 (H) 10/17/2018 2236   O2SAT 65.2 10/21/2018 0350   CBG (last 3)  Recent Labs    10/22/18 1615 10/22/18 2112 10/23/18 0633  GLUCAP 100* 166* 104*    Assessment/Plan: S/P Procedure(s) (LRB): CORONARY ARTERY BYPASS GRAFTING (CABG), ON PUMP, TIMES FOUR, USING LIMA TO LAD, ENDOSCOPICALLY HARVESTED GREATER SAPHENOUS VEIN TO DIAG 2, SVG TO DISTAL RCA, AND LEFT RADIAL TO RAMUS INTERMEDIUS (N/A) RADIAL ARTERY HARVEST (Left) TRANSESOPHAGEAL ECHOCARDIOGRAM (TEE) (N/A) Endovein Harvest Of Greater Saphenous Vein (Right)  -Postop day 6 CABG x4 for multivessel coronary disease, UAP,  and normal function.  He has made excellent progress with mobility and is maintaining excellent oxygen saturation on room air.  -Low-grade fever with chills and mild tachycardia after taking a walk this morning.  His review of systems and exam is otherwise unremarkable.  CBC and BMP are pending.  PA and lateral chest x-ray this morning shows continued improvement of expected postop changes.  We will check a UA and culture if appropriate.  COVID test obtained 9 days ago was negative.  -Acute renal insufficiency-Baseline creatinine 0.99 preop and 2.69 postop.  Renal function continues to recover with good urine output.  Potassium has been stable.  -Dyslipidemia-continue atorvastatin  -History of benign prostatic hyperplasia-Flomax has  been resumed  -DVT prophylaxis-SCD's ordered  - LOS: 9 days    Leary RocaMyron G. Roddenberry, PA-C [phone] 10/23/2018 Temp 99.5 today. Chest x-ray shows improved pleural effusion and probable right middle lobe subsegmental atelectasis. Patiently definitely getting stronger and should be ready for discharge by midweek. All  surgical incisions inspected and appear to be healing satisfactorily. No cellulitis at IV sites. Check creatinine, WBC in a.m. Continue oral Lasix  patient examined and medical record reviewed,agree with above note. Kathlee Nationseter Van Trigt III 10/23/2018

## 2018-10-23 NOTE — Progress Notes (Signed)
Pt ambulated x 470 feet in hall way pt tolerated well

## 2018-10-24 LAB — GLUCOSE, CAPILLARY
Glucose-Capillary: 87 mg/dL (ref 70–99)
Glucose-Capillary: 87 mg/dL (ref 70–99)
Glucose-Capillary: 115 mg/dL — ABNORMAL HIGH (ref 70–99)
Glucose-Capillary: 102 mg/dL — ABNORMAL HIGH (ref 70–99)

## 2018-10-24 LAB — BASIC METABOLIC PANEL
Anion gap: 9 (ref 5–15)
BUN: 22 mg/dL — ABNORMAL HIGH (ref 6–20)
CO2: 24 mmol/L (ref 22–32)
Calcium: 7.9 mg/dL — ABNORMAL LOW (ref 8.9–10.3)
Chloride: 101 mmol/L (ref 98–111)
Creatinine, Ser: 1.74 mg/dL — ABNORMAL HIGH (ref 0.61–1.24)
GFR calc Af Amer: 52 mL/min — ABNORMAL LOW (ref >60)
GFR calc non Af Amer: 45 mL/min — ABNORMAL LOW (ref >60)
Glucose, Bld: 108 mg/dL — ABNORMAL HIGH (ref 70–99)
Potassium: 3.9 mmol/L (ref 3.5–5.1)
Sodium: 134 mmol/L — ABNORMAL LOW (ref 135–145)

## 2018-10-24 LAB — CBC
HCT: 28.9 % — ABNORMAL LOW (ref 39.0–52.0)
Hemoglobin: 9.5 g/dL — ABNORMAL LOW (ref 13.0–17.0)
MCH: 28.1 pg (ref 26.0–34.0)
MCHC: 32.9 g/dL (ref 30.0–36.0)
MCV: 85.5 fL (ref 80.0–100.0)
Platelets: 300 10*3/uL (ref 150–400)
RBC: 3.38 MIL/uL — ABNORMAL LOW (ref 4.22–5.81)
RDW: 14 % (ref 11.5–15.5)
WBC: 9.6 10*3/uL (ref 4.0–10.5)
nRBC: 0.9 % — ABNORMAL HIGH (ref 0.0–0.2)

## 2018-10-24 NOTE — Progress Notes (Addendum)
[address]       [place] [address]             [phone]      7 Days Post-Op Procedure(s) (LRB): CORONARY ARTERY BYPASS GRAFTING (CABG), ON PUMP, TIMES FOUR, USING LIMA TO LAD, ENDOSCOPICALLY HARVESTED GREATER SAPHENOUS VEIN TO DIAG 2, SVG TO DISTAL RCA, AND LEFT RADIAL TO RAMUS INTERMEDIUS (N/A) RADIAL ARTERY HARVEST (Left) TRANSESOPHAGEAL ECHOCARDIOGRAM (TEE) (N/A) Endovein Harvest Of Greater Saphenous Vein (Right) Subjective: Feeling pretty well, some incisional discomfort, mild "chills"  Objective: Vital signs in last 24 hours: Temp:  [98.2 F (36.8 C)-100 F (37.8 C)] 99.1 F (37.3 C) (05/18 0734) Pulse Rate:  [64-106] 64 (05/18 0734) Cardiac Rhythm: Sinus tachycardia (05/18 0610) Resp:  [13-23] 18 (05/18 0734) BP: (103-133)/(67-91) 103/81 (05/18 0734) SpO2:  [97 %-100 %] 100 % (05/18 0734) Weight:  [90.9 kg] 90.9 kg (05/18 0610)  Hemodynamic parameters for last 24 hours:    Intake/Output from previous day: 05/17 0701 - 05/18 0700 In: 480 [P.O.:480] Out: 1825 [Urine:1825] Intake/Output this shift: No intake/output data recorded.  General appearance: alert, cooperative and no distress Heart: regular rate and rhythm Lungs: mildly dim in right base Abdomen: benign Extremities: no edema Wound: incis healing well, L arm N/V intact  Lab Results: Recent Labs    10/23/18 0926 10/24/18 0439  WBC 11.8* 9.6  HGB 10.6* 9.5*  HCT 32.5* 28.9*  PLT 265 300   BMET:  Recent Labs    10/23/18 0926 10/24/18 0439  NA 137 134*  K 4.1 3.9  CL 100 101  CO2 23 24  GLUCOSE 94 108*  BUN 25* 22*  CREATININE 1.78* 1.74*  CALCIUM 8.4* 7.9*    PT/INR: No results for input(s): LABPROT, INR in the last 72 hours. ABG    Component Value Date/Time   PHART 7.331 (L) 10/17/2018 2236   HCO3 22.2 10/17/2018 2236   TCO2 23 10/17/2018 2236   ACIDBASEDEF 3.0 (H) 10/17/2018 2236   O2SAT 65.2 10/21/2018 0350   CBG (last 3)  Recent Labs    10/23/18  1639 10/23/18 2119 10/24/18 0623  GLUCAP 112* 135* 87    Meds Scheduled Meds: . sodium chloride   Intravenous Once  . sodium chloride   Intravenous Once  . sodium chloride   Intravenous Once  . aspirin EC  81 mg Oral Daily   Or  . aspirin  81 mg Per Tube Daily  . atorvastatin  40 mg Oral QHS  . bisacodyl  10 mg Oral Daily   Or  . bisacodyl  10 mg Rectal Daily  . Chlorhexidine Gluconate Cloth  6 each Topical Daily  . docusate sodium  200 mg Oral Daily  . ferrous sulfate  325 mg Oral Q breakfast  . folic acid  1 mg Oral Daily  . furosemide  40 mg Oral Daily  . insulin aspart  0-24 Units Subcutaneous TID AC & HS  . isosorbide mononitrate  30 mg Oral Daily  . mouth rinse  15 mL Mouth Rinse BID  . metoprolol tartrate  25 mg Oral BID   Or  . metoprolol tartrate  25 mg Per Tube BID  . pantoprazole  40 mg Oral Daily  . potassium chloride  20 mEq Oral BID  . tamsulosin  0.4 mg Oral QHS   Continuous Infusions: . sodium chloride     PRN Meds:.fentaNYL (SUBLIMAZE) injection, metoprolol tartrate, ondansetron (ZOFRAN) IV, oxyCODONE, sodium chloride flush, sodium  chloride flush, traMADol  Xrays Dg Chest 2 View  Result Date: 10/23/2018 CLINICAL DATA:  Status post CABG EXAM: CHEST - 2 VIEW COMPARISON:  10/22/2018 FINDINGS: Cardiomegaly.  Postsurgical changes related to prior CABG. Small layering right pleural effusion, improved.  No pneumothorax. Median sternotomy. IMPRESSION: Small layering right pleural effusion, improved. Postsurgical changes related to prior CABG. Electronically Signed   By: Charline BillsSriyesh  Krishnan M.D.   On: 10/23/2018 06:21    Assessment/Plan: S/P Procedure(s) (LRB): CORONARY ARTERY BYPASS GRAFTING (CABG), ON PUMP, TIMES FOUR, USING LIMA TO LAD, ENDOSCOPICALLY HARVESTED GREATER SAPHENOUS VEIN TO DIAG 2, SVG TO DISTAL RCA, AND LEFT RADIAL TO RAMUS INTERMEDIUS (N/A) RADIAL ARTERY HARVEST (Left) TRANSESOPHAGEAL ECHOCARDIOGRAM (TEE) (N/A) Endovein Harvest Of Greater  Saphenous Vein (Right)  1 doing well with steady improvement  2 Tmax 100, no clinical signs of active infection, leukocytosis trend improved to normal range 3 hemodyn stable with some sinus tachy, occ PVC's- cont metoprolol, BP ok, no ACE-I/ARB at this time with ARI- creat conts to trend better. Good UOP, does not appear significantly volume overloaded- cont lasix for now with some right pleural effusion 4 Anemia a little worse today, not in transfusion threshold- cont to monitor 5 BS control is pretty good, Hg A1C 6.3- was on januvia at home- hold on restarting for now, may need to at some point  6 cont pulm toilet and cardiac rehab 7 poss home 1-2 days  LOS: 10 days    Rowe ClackWayne E Gold PA-C 10/24/2018 Pager [phone] Creat gradually improving 1.7 Temp 99 with normal WBC Incisions clean Last CXR with R basilar sub-segmental altelectasis- repeat in am Needs further monitoring in hospital patient examined and medical record reviewed,agree with above note. Kathlee Nationseter Van Trigt III 10/24/2018

## 2018-10-24 NOTE — Progress Notes (Addendum)
Cardiac Rehab Advisory Cardiac Rehab Phase I is not seeing pts face to face at this time due to Covid 19 restrictions. Ambulation is occurring through nursing, PT, and mobility teams. We will help facilitate that process as needed. We are calling pts in their rooms and discussing education. We will then deliver education materials to pts RN for delivery to pt.   [phone] Called pt to see if any questions re ed done on Saturday and to encourage daily weights and to discuss CRP 2. Pt stated he will get scales so he can weigh daily. Stated he has been walking with walker and thinks he needs one for home. Told him to discuss with RN who can notify case manager. Encouraged three walks a day and using IS 10x an hour. Pt stated getting [phone] ml on IS. Referring to [place] CRP 2.  Pt stated he will look over materials at a later time. Voiced understanding of information.Luetta Nutting RN BSN 10/24/2018 10:50 AM  1053 Sent message to case manager to check with pt re need for walker.

## 2018-10-24 NOTE — Progress Notes (Signed)
Pt ambulated x 470 feet in hall and tolerated well

## 2018-10-25 LAB — BASIC METABOLIC PANEL
Anion gap: 9 (ref 5–15)
BUN: 17 mg/dL (ref 6–20)
CO2: 23 mmol/L (ref 22–32)
Calcium: 8.2 mg/dL — ABNORMAL LOW (ref 8.9–10.3)
Chloride: 102 mmol/L (ref 98–111)
Creatinine, Ser: 1.67 mg/dL — ABNORMAL HIGH (ref 0.61–1.24)
GFR calc Af Amer: 55 mL/min — ABNORMAL LOW (ref >60)
GFR calc non Af Amer: 47 mL/min — ABNORMAL LOW (ref >60)
Glucose, Bld: 100 mg/dL — ABNORMAL HIGH (ref 70–99)
Potassium: 4.2 mmol/L (ref 3.5–5.1)
Sodium: 134 mmol/L — ABNORMAL LOW (ref 135–145)

## 2018-10-25 LAB — CBC
HCT: 30 % — ABNORMAL LOW (ref 39.0–52.0)
Hemoglobin: 9.7 g/dL — ABNORMAL LOW (ref 13.0–17.0)
MCH: 27.7 pg (ref 26.0–34.0)
MCHC: 32.3 g/dL (ref 30.0–36.0)
MCV: 85.7 fL (ref 80.0–100.0)
Platelets: 363 10*3/uL (ref 150–400)
RBC: 3.5 MIL/uL — ABNORMAL LOW (ref 4.22–5.81)
RDW: 14.3 % (ref 11.5–15.5)
WBC: 9.8 10*3/uL (ref 4.0–10.5)
nRBC: 0.3 % — ABNORMAL HIGH (ref 0.0–0.2)

## 2018-10-25 LAB — GLUCOSE, CAPILLARY
Glucose-Capillary: 98 mg/dL (ref 70–99)
Glucose-Capillary: 112 mg/dL — ABNORMAL HIGH (ref 70–99)
Glucose-Capillary: 119 mg/dL — ABNORMAL HIGH (ref 70–99)
Glucose-Capillary: 124 mg/dL — ABNORMAL HIGH (ref 70–99)

## 2018-10-25 MED ORDER — POTASSIUM CHLORIDE CRYS ER 20 MEQ PO TBCR
20.0000 meq | EXTENDED_RELEASE_TABLET | Freq: Every day | ORAL | Status: DC
  Administered 2018-10-25 – 2018-10-26 (×2): 20 meq via ORAL
  Filled 2018-10-25 (×2): qty 1

## 2018-10-25 MED ORDER — ISOSORBIDE MONONITRATE ER 30 MG PO TB24: 30.0000 mg | ORAL_TABLET | Freq: Every day | ORAL | 0 refills | Status: DC

## 2018-10-25 MED ORDER — FOLIC ACID 1 MG PO TABS: 1.0000 mg | ORAL_TABLET | Freq: Every day | ORAL | Status: DC

## 2018-10-25 MED ORDER — FERROUS SULFATE 325 (65 FE) MG PO TABS: 325.0000 mg | ORAL_TABLET | Freq: Every day | ORAL | 3 refills | Status: DC

## 2018-10-25 MED ORDER — TAMSULOSIN HCL 0.4 MG PO CAPS: 0.4000 mg | ORAL_CAPSULE | Freq: Every day | ORAL | 0 refills | Status: DC

## 2018-10-25 NOTE — Progress Notes (Signed)
Patient education completed for removal of pacing wires, patient verbalized understanding, pacing wires removed without difficulty , patient now on bedrest for one hour  call bell within reach will continue to monitor.

## 2018-10-25 NOTE — Discharge Instructions (Signed)
Discharge Instructions:  1. You may shower, please wash incisions daily with soap and water and keep dry.  If you wish to cover wounds with dressing you may do so but please keep clean and change daily.  No tub baths or swimming until incisions have completely healed.  If your incisions become red or develop any drainage please call our office at [phone]  2. No Driving until cleared by Dr. Dennie MaizesGerhardt's office and you are no longer using narcotic pain medications  3. Monitor your weight daily.. Please use the same scale and weigh at same time... If you gain 5-10 lbs in 48 hours with associated lower extremity swelling, please contact our office at [phone]  4. Fever of 101.5 for at least 24 hours with no source, please contact our office at [phone]  5. Activity- up as tolerated, please walk at least 3 times per day.  Avoid strenuous activity, no lifting, pushing, or pulling with your arms over 8-10 lbs for a minimum of 6 weeks  6. If any questions or concerns arise, please do not hesitate to contact our office at [phone]  Acute Pain, Adult Acute pain is a type of pain that may last for just a few days or as long as six months. It is often related to an illness, injury, or medical procedure. Acute pain may be mild, moderate, or severe. It usually goes away once your injury has healed or you are no longer ill. Pain can make it hard for you to do daily activities. It can cause anxiety and lead to other problems if left untreated. Treatment depends on the cause and severity of your acute pain. Follow these instructions at home:  Check your pain level as told by your health care provider.  Take over-the-counter and prescription medicines only as told by your health care provider.  If you are taking prescription pain medicine: ? Ask your health care provider about taking a stool softener or laxative to prevent constipation. ? Do not stop taking the medicine suddenly. Talk to your  health care provider about how and when to discontinue prescription pain medicine. ? If your pain is severe, do not take more pills than instructed by your health care provider. ? Do not take other over-the-counter pain medicines in addition to this medicine unless told by your health care provider. ? Do not drive or operate heavy machinery while taking prescription pain medicine.  Apply ice or heat as told by your health care provider. These may reduce swelling and pain.  Ask your health care provider if other strategies such as distraction, relaxation, or physical therapies can help your pain.  Keep all follow-up visits as told by your health care provider. This is important. Contact a health care provider if:  You have pain that is not controlled by medicine.  Your pain does not improve or gets worse.  You have side effects from pain medicines, such as vomitingor confusion. Get help right away if:  You have severe pain.  You have trouble breathing.  You lose consciousness.  You have chest pain or pressure that lasts for more than a few minutes. Along with the chest pain you may: ? Have pain or discomfort in one or both arms, your back, neck, jaw, or stomach. ? Have shortness of breath. ? Break out in a cold sweat. ? Feel nauseous. ? Become light-headed. These symptoms may represent a serious problem that is an emergency. Do not wait to see if the symptoms will go  away. Get medical help right away. Call your local emergency services (911 in the [place] Do not drive yourself to the hospital. This information is not intended to replace advice given to you by your health care provider. Make sure you discuss any questions you have with your health care provider. Document Released: 06/09/2015 Document Revised: 11/01/2015 Document Reviewed: 06/09/2015 Elsevier Interactive Patient Education  2019 [employer].

## 2018-10-25 NOTE — Progress Notes (Addendum)
      [address]       [place] [address]             [phone]        8 Days Post-Op Procedure(s) (LRB): CORONARY ARTERY BYPASS GRAFTING (CABG), ON PUMP, TIMES FOUR, USING LIMA TO LAD, ENDOSCOPICALLY HARVESTED GREATER SAPHENOUS VEIN TO DIAG 2, SVG TO DISTAL RCA, AND LEFT RADIAL TO RAMUS INTERMEDIUS (N/A) RADIAL ARTERY HARVEST (Left) TRANSESOPHAGEAL ECHOCARDIOGRAM (TEE) (N/A) Endovein Harvest Of Greater Saphenous Vein (Right)  Subjective: Patient has not had bowel movement yet. He already walked entire hall this am. He has no other specific complaints  Objective: Vital signs in last 24 hours: Temp:  [98.1 F (36.7 C)-99.5 F (37.5 C)] 98.1 F (36.7 C) (05/19 0400) Pulse Rate:  [64-94] 94 (05/18 1559) Cardiac Rhythm: Normal sinus rhythm (05/18 1900) Resp:  [17-20] 20 (05/19 0400) BP: (103-141)/(74-84) 136/83 (05/19 0400) SpO2:  [98 %-100 %] 98 % (05/19 0400) Weight:  [90 kg] 90 kg (05/19 0600)  Pre op weight  89.9 kg Current Weight  10/25/18 90 kg      Intake/Output from previous day: 05/18 0701 - 05/19 0700 In: -  Out: 1540 [Urine:1540]   Physical Exam:  Cardiovascular: RRR Pulmonary: Slightly diminished bases Abdomen: Soft, non tender, bowel sounds present. Extremities: Trace  bilateral lower extremity edema. Wounds: Clean and dry.  No erythema or signs of infection.  Lab Results: CBC: Recent Labs    10/24/18 0439 10/25/18 0324  WBC 9.6 9.8  HGB 9.5* 9.7*  HCT 28.9* 30.0*  PLT 300 363   BMET:  Recent Labs    10/24/18 0439 10/25/18 0324  NA 134* 134*  K 3.9 4.2  CL 101 102  CO2 24 23  GLUCOSE 108* 100*  BUN 22* 17  CREATININE 1.74* 1.67*  CALCIUM 7.9* 8.2*    PT/INR:  Lab Results  Component Value Date   INR 1.4 (H) 10/17/2018   INR 1.0 10/16/2018   INR 1.0 09/18/2018   ABG:  INR: Will add last result for INR, ABG once components are confirmed Will add last 4 CBG results once components are confirmed   Assessment/Plan:  1. CV - SR. On Lopressor 25 mg bid and Imdur 30 mg daily. Not on ACE/ARB secondary to elevated creatinine, but will hopefully be able to restart once creatinine normalized as an outpatient. 2.  Pulmonary - On room air. Encourage incentive spirometer 3. Volume Overload - On Lasix 40 mg daily 4.  Acute blood loss anemia - H and H this am stable at 9.7 and 30. Will continue ferrous sulfate for one month after discharge. 5. Acute kidney injury-creatinine decreased to 1.67 6. Remove EPW 7. LOC if does not move bowels after oatmeal 8. DM-CBGs 115/102/98. On Insulin. Will restart Jardiance at discharge. Pre op HGA1C 6.3 9. As discussed with Dr. Tyrone Sage, re check creatinine in am and if stable will discharge in am  Lelon Huh ZimmermanPA-C 10/25/2018,7:10 AM [phone]  Follow up labs in am, if renal function stable consider home  I have seen and examined Danny Brewer and agree with the above assessment  and plan.  [name] MD Beeper [phone] Office [phone] 10/25/2018 5:11 PM

## 2018-10-26 LAB — BASIC METABOLIC PANEL
Anion gap: 10 (ref 5–15)
BUN: 16 mg/dL (ref 6–20)
CO2: 21 mmol/L — ABNORMAL LOW (ref 22–32)
Calcium: 8.3 mg/dL — ABNORMAL LOW (ref 8.9–10.3)
Chloride: 103 mmol/L (ref 98–111)
Creatinine, Ser: 1.65 mg/dL — ABNORMAL HIGH (ref 0.61–1.24)
GFR calc Af Amer: 56 mL/min — ABNORMAL LOW (ref >60)
GFR calc non Af Amer: 48 mL/min — ABNORMAL LOW (ref >60)
Glucose, Bld: 104 mg/dL — ABNORMAL HIGH (ref 70–99)
Potassium: 4.3 mmol/L (ref 3.5–5.1)
Sodium: 134 mmol/L — ABNORMAL LOW (ref 135–145)

## 2018-10-26 LAB — GLUCOSE, CAPILLARY
Glucose-Capillary: 110 mg/dL — ABNORMAL HIGH (ref 70–99)
Glucose-Capillary: 96 mg/dL (ref 70–99)

## 2018-10-26 MED ORDER — METOPROLOL SUCCINATE ER 50 MG PO TB24: 50.0000 mg | ORAL_TABLET | Freq: Every day | ORAL | 1 refills | Status: DC

## 2018-10-26 MED ORDER — POTASSIUM CHLORIDE CRYS ER 10 MEQ PO TBCR: 10.0000 meq | EXTENDED_RELEASE_TABLET | Freq: Every day | ORAL | 0 refills | Status: DC

## 2018-10-26 MED ORDER — FUROSEMIDE 40 MG PO TABS: 40.0000 mg | ORAL_TABLET | Freq: Every day | ORAL | 0 refills | Status: DC

## 2018-10-26 MED ORDER — OXYCODONE HCL 5 MG PO TABS: 5.0000 mg | ORAL_TABLET | ORAL | 0 refills | Status: DC | PRN

## 2018-10-26 NOTE — Progress Notes (Addendum)
      [address]       [place] [address]             [phone]        9 Days Post-Op Procedure(s) (LRB): CORONARY ARTERY BYPASS GRAFTING (CABG), ON PUMP, TIMES FOUR, USING LIMA TO LAD, ENDOSCOPICALLY HARVESTED GREATER SAPHENOUS VEIN TO DIAG 2, SVG TO DISTAL RCA, AND LEFT RADIAL TO RAMUS INTERMEDIUS (N/A) RADIAL ARTERY HARVEST (Left) TRANSESOPHAGEAL ECHOCARDIOGRAM (TEE) (N/A) Endovein Harvest Of Greater Saphenous Vein (Right)  Subjective: Patient had several bowel movements yesterday. His only complaint is that he did not sleep well last night.  Objective: Vital signs in last 24 hours: Temp:  [98.3 F (36.8 C)-99.1 F (37.3 C)] 98.8 F (37.1 C) (05/20 0409) Pulse Rate:  [75-101] 91 (05/20 0411) Cardiac Rhythm: Normal sinus rhythm (05/20 0401) Resp:  [14-25] 18 (05/20 0411) BP: (109-138)/(65-124) 122/76 (05/20 0411) SpO2:  [94 %-100 %] 98 % (05/20 0411) Weight:  [88.4 kg] 88.4 kg (05/20 0411)  Pre op weight  89.9 kg Current Weight  10/26/18 88.4 kg      Intake/Output from previous day: 05/19 0701 - 05/20 0700 In: 480 [P.O.:480] Out: 1500 [Urine:1500]   Physical Exam:  Cardiovascular: RRR Pulmonary: Slightly diminished bases Abdomen: Soft, non tender, bowel sounds present. Extremities: Trace bilateral lower extremity edema. Left forearm with motor/sensory intact. Wounds: Clean and dry.  No erythema or signs of infection.  Lab Results: CBC: Recent Labs    10/24/18 0439 10/25/18 0324  WBC 9.6 9.8  HGB 9.5* 9.7*  HCT 28.9* 30.0*  PLT 300 363   BMET:  Recent Labs    10/25/18 0324 10/26/18 0312  NA 134* 134*  K 4.2 4.3  CL 102 103  CO2 23 21*  GLUCOSE 100* 104*  BUN 17 16  CREATININE 1.67* 1.65*  CALCIUM 8.2* 8.3*    PT/INR:  Lab Results  Component Value Date   INR 1.4 (H) 10/17/2018   INR 1.0 10/16/2018   INR 1.0 09/18/2018   ABG:  INR: Will add last result for INR, ABG once components are confirmed Will add last 4  CBG results once components are confirmed  Assessment/Plan:  1. CV - SR. On Lopressor 25 mg bid and Imdur 30 mg daily. Not on ACE/ARB secondary to elevated creatinine, but will hopefully be able to restart once creatinine normalized as an outpatient. 2.  Pulmonary - On room air. Encourage incentive spirometer 3. Volume Overload - On Lasix 40 mg daily and will continue for several more days 4.  Acute blood loss anemia - H and H this am stable at 9.7 and 30. Will continue ferrous sulfate for one month after discharge. 5. Acute kidney injury-creatinine slightly decreased to 1.65 6. DM-CBGs 112/119/124. On Insulin. Will restart Jardiance at discharge. Pre op HGA1C 6.3 7. Discharge  Lelon Huh ZimmermanPA-C 10/26/2018,7:32 AM [phone] Discussed home care with patient wife  Plan home today  I have seen and examined [name] and agree with the above assessment  and plan.  [name] MD Beeper [phone] Office [phone] 10/26/2018 10:04 AM

## 2018-10-26 NOTE — Progress Notes (Signed)
10/26/2018 11:44 AM Discharge AVS meds taken today and those due this evening reviewed.  Follow-up appointments and when to call md reviewed.  D/C IV and TELE.  Questions and concerns addressed.   D/C home per orders. Kathryne Hitch

## 2018-10-26 NOTE — Progress Notes (Signed)
10/26/2018 1000 Chest tube sutures removed, pt tolerated well. Danny Brewer

## 2018-10-28 ENCOUNTER — Other Ambulatory Visit: Payer: Self-pay

## 2018-10-28 ENCOUNTER — Ambulatory Visit (INDEPENDENT_AMBULATORY_CARE_PROVIDER_SITE_OTHER): Payer: Self-pay | Admitting: Cardiothoracic Surgery

## 2018-10-28 ENCOUNTER — Ambulatory Visit
Admission: RE | Admit: 2018-10-28 | Discharge: 2018-10-28 | Disposition: A | Discharge status: Home / Self Care | Payer: BC Managed Care – PPO | Source: Ambulatory Visit | Attending: Cardiothoracic Surgery | Admitting: Cardiothoracic Surgery

## 2018-10-28 VITALS — BP 112/77 | HR 100 | Temp 97.7°F | Resp 20 | Ht 69.0 in | Wt 190.0 lb

## 2018-10-28 DIAGNOSIS — T148XXA Other injury of unspecified body region, initial encounter: Secondary | ICD-10-CM

## 2018-10-28 DIAGNOSIS — Z951 Presence of aortocoronary bypass graft: Secondary | ICD-10-CM

## 2018-10-28 DIAGNOSIS — R0781 Pleurodynia: Secondary | ICD-10-CM

## 2018-10-28 NOTE — Progress Notes (Signed)
[address]       [place] [address]             [phone]      Yakir Wenke [facility] Medical Record #[id number] Date of Birth: Apr 16, 1971  Referring: Ileana Ladd, MD Primary Care: Ileana Ladd, MD Primary Cardiologist: Tonny Bollman, MD   Chief Complaint:   POST OP FOLLOW UP OPERATIVE REPORT DATE OF PROCEDURE:  10/17/2018 PREOPERATIVE DIAGNOSIS:  Unstable angina with 3-vessel coronary artery disease. POSTOPERATIVE DIAGNOSES:  Unstable angina with 3-vessel coronary artery disease. SURGICAL PROCEDURE:  Coronary artery bypass grafting x4 with the left internal mammary to the left anterior descending coronary artery, left radial artery to the ramus intermedius, reverse saphenous vein graft to the second diagonal, reverse saphenous  vein graft to the distal right coronary artery with left radial artery harvest and endoscopic right greater saphenous thigh harvest. SURGEON:  Sheliah Plane, MD  History of Present Illness:     Patient called office today after coronary artery bypass grafting 10/17/2018 and discharge home 4 days ago.  The patient was concerned about his right leg incision, and mild abdominal discomfort.  He notes that his bowels have not moved since since he was in the hospital.  He has had no nausea or vomiting.  No fever, no pleuritic chest pain, no leg swelling.  A chest x-ray was done today before he was seen in the office.      Past Medical History:  Diagnosis Date  . Complication of anesthesia    Pt verble and "prophecizes" after anesthesia  . Coronary Artery Disease    Coronary CTA 09/2018: pLAD 70-90; pD1+pD2 70-90; mRCA mixed plaque - cannot rule out 70-90; FFR suggests hemodynamically significant stenosis  . Diabetes mellitus without complication (HCC)   . Echocardiogram 09/2018    Echo 09/2018: EF 60-65  . GERD (gastroesophageal reflux disease)   . Hyperlipidemia   . Hypertension   . Inguinal hernia 2016   right  .  Kidney stones   . Vision abnormalities      Social History   Tobacco Use  Smoking Status Never Smoker  Smokeless Tobacco Never Used    Social History   Substance and Sexual Activity  Alcohol Use No     Allergies  Allergen Reactions  . Antihistamines, Diphenhydramine-Type Hives and Other (See Comments)    Seizures  . Demerol Hives  . Morphine And Related Hives    Esp with high dose morphine, possibly Dilaudid Tolerates hydrocodone, oxycodone    Current Outpatient Medications  Medication Sig Dispense Refill  . acetaminophen (TYLENOL) 500 MG tablet Take 1,000 mg by mouth daily as needed for moderate pain or headache.    Marland Kitchen aspirin 81 MG tablet Take 1 tablet (81 mg total) by mouth daily.    Marland Kitchen atorvastatin (LIPITOR) 40 MG tablet Take 40 mg by mouth at bedtime.     . ferrous sulfate 325 (65 FE) MG tablet Take 1 tablet (325 mg total) by mouth daily with breakfast. For one month;if develops constipation, may stop sooner.  3  . folic acid (FOLVITE) 1 MG tablet Take 1 tablet (1 mg total) by mouth daily. For one month then stop.    . furosemide (LASIX) 40 MG tablet Take 1 tablet (40 mg total) by mouth daily. For 5 days then stop. 5 tablet 0  . isosorbide mononitrate (IMDUR) 30 MG 24 hr tablet Take 1 tablet (30 mg total) by mouth daily. For  one month then stop. 30 tablet 0  . metoprolol succinate (TOPROL-XL) 50 MG 24 hr tablet Take 1 tablet (50 mg total) by mouth daily. 30 tablet 1  . oxyCODONE (OXY IR/ROXICODONE) 5 MG immediate release tablet Take 1 tablet (5 mg total) by mouth every 4 (four) hours as needed for severe pain. 30 tablet 0  . potassium chloride (K-DUR) 10 MEQ tablet Take 1 tablet (10 mEq total) by mouth daily. For 5 days then stop. 5 tablet 0  . sitaGLIPtin (JANUVIA) 100 MG tablet Take 1 tablet (100 mg total) by mouth at bedtime for 30 days. 30 tablet 0  . tamsulosin (FLOMAX) 0.4 MG CAPS capsule Take 1 capsule (0.4 mg total) by mouth at bedtime for 30 days. 30 capsule 0    No current facility-administered medications for this visit.        Physical Exam: BP 112/77   Pulse 100   Temp 97.7 F (36.5 C) (Skin)   Resp 20   Ht 5\' 9"  (1.753 m)   Wt 190 lb (86.2 kg)   SpO2 97% Comment: RA  BMI 28.06 kg/m   General appearance: alert, cooperative, appears stated age and no distress Neurologic: intact Heart: regular rate and rhythm, S1, S2 normal, no murmur, click, rub or gallop Lungs: clear to auscultation bilaterally Abdomen: soft, non-tender; bowel sounds normal; no masses,  no organomegaly Extremities: The right upper leg and a vein site was cleaned, incision was intact without evidence of infection, patient had some slight tenderness along the Endo harvest track with mild bruising Wound: Sternal incision is intact   Diagnostic Studies & Laboratory data:     Recent Radiology Findings:   Dg Chest 2 View  Result Date: 10/28/2018 CLINICAL DATA:  Chest pain.  Status post coronary bypass graft. EXAM: CHEST - 2 VIEW COMPARISON:  Radiographs of Oct 23, 2018. FINDINGS: Stable cardiomediastinal silhouette. Status post coronary bypass graft. No pneumothorax is noted. Mild right basilar atelectasis or infiltrate is noted with associated small right pleural effusion. Minimal left pleural effusion is noted as well. Bony thorax is unremarkable. IMPRESSION: Mild right basilar atelectasis or infiltrate is noted with associated small right pleural effusion. Minimal left pleural effusion. Electronically Signed   By: Lupita Raider M.D.   On: 10/28/2018 13:33   I have independently reviewed the above radiology studies  and reviewed the findings with the patient.    Recent Lab Findings: Lab Results  Component Value Date   WBC 9.8 10/25/2018   HGB 9.7 (L) 10/25/2018   HCT 30.0 (L) 10/25/2018   PLT 363 10/25/2018   GLUCOSE 104 (H) 10/26/2018   CHOL 198 09/19/2018   TRIG 67 09/19/2018   HDL 46 09/19/2018   LDLCALC 139 (H) 09/19/2018   ALT 31 10/18/2018   AST 50  (H) 10/18/2018   NA 134 (L) 10/26/2018   K 4.3 10/26/2018   CL 103 10/26/2018   CREATININE 1.65 (H) 10/26/2018   BUN 16 10/26/2018   CO2 21 (L) 10/26/2018   TSH 3.900 09/18/2018   INR 1.4 (H) 10/17/2018   HGBA1C 6.3 (H) 10/16/2018      Assessment / Plan:   Patient appears stable after coronary artery bypass grafting, mild abdominal discomfort likely related to constipation, he was discharged home on Colace and will continue this and use MiraLAX as needed. His sternal incisions are intact without evidence of infection He has no chest discomfort to suggest congestive heart failure or angina We will plan to see him  back in 2 weeks He is aware to call the office should he have any further complaints    Medication Changes: No orders of the defined types were placed in this encounter.     Delight OvensEdward B Julianah Marciel MD      [address] [place] [id number] Office [phone]   Beeper [phone]  10/28/2018 5:20 PM

## 2018-10-31 DIAGNOSIS — I10 Essential (primary) hypertension: Secondary | ICD-10-CM | POA: Insufficient documentation

## 2018-10-31 DIAGNOSIS — E785 Hyperlipidemia, unspecified: Secondary | ICD-10-CM | POA: Insufficient documentation

## 2018-10-31 DIAGNOSIS — E119 Type 2 diabetes mellitus without complications: Secondary | ICD-10-CM | POA: Insufficient documentation

## 2018-10-31 NOTE — Progress Notes (Signed)
Virtual Visit via Video Note   This visit type was conducted due to national recommendations for restrictions regarding the COVID-19 Pandemic (e.g. social distancing) in an effort to limit this patient's exposure and mitigate transmission in our community.  Due to his co-morbid illnesses, this patient is at least at moderate risk for complications without adequate follow up.  This format is felt to be most appropriate for this patient at this time.  All issues noted in this document were discussed and addressed.  A limited physical exam was performed with this format.  Please refer to the patient's chart for his consent to telehealth for [facility].   Date:  11/01/2018   ID:  Danny Brewer, DOB 1971-01-26, MRN [id number]  Patient Location: Home Provider Location: Home  PCP:  Ileana Ladd, MD  Cardiologist:  Tonny Bollman, MD   Electrophysiologist:  None   Evaluation Performed:  Follow-Up Visit  Chief Complaint:  Follow up post CABG  History of Present Illness:    Danny Brewer is a 48 y.o. male with coronary artery disease s/p CABG, diabetes, hypertension, hyperlipidemia.  He was evaluated for chest pain in 09/2018.  A follow up coronary CTA demonstrated hemodynamically significant stenosis in the proximal LAD, D1, D2.  He was set up for Cardiac Catheterization that demonstrated severe 2 vessel CAD with 95% proximal LAD stenosis, 80% ostial D1 and D2 stenoses and 85% mid RCA stenosis.  He was admitted and underwent CABG (L-LAD, L radial-RI, S-D2, S-RCA) with Dr. Tyrone Sage.  Post op course was complicated by AKI (peak Creatinine 2.69), blood loss anemia requiring transfusion with PRBCs, volume excess.  Creatinine at DC improved to 1.65.    Today, he notes he is doing well. He did have to see Dr. Tyrone Sage for some pain around his leg incision.  He was constipated and having some abdominal bloating.  He is having more regular bowel movements.  He still has some swelling at the base of his  sternotomy incision.  He has discomfort in this area, especially on the R, with certain positions. His chest is still sore, but improving.  His appetite is poor.  He has lost about 15 lbs.  He has been walking a great deal and has not had any significant shortness of breath.  He has not had a fever.  He has not had syncope, dizziness.    The patient does not have symptoms concerning for COVID-19 infection (fever, chills, cough, or new shortness of breath).    Past Medical History:  Diagnosis Date   Complication of anesthesia    Pt verble and "prophecizes" after anesthesia   Coronary Artery Disease    Coronary CTA 09/2018: pLAD 70-90; pD1+pD2 70-90; mRCA mixed plaque - cannot rule out 70-90; FFR suggests hemodynamically significant stenosis   Diabetes mellitus without complication (HCC)    Echocardiogram 09/2018    Echo 09/2018: EF 60-65   GERD (gastroesophageal reflux disease)    Hyperlipidemia    Hypertension    Inguinal hernia 2016   right   Kidney stones    Vision abnormalities    Past Surgical History:  Procedure Laterality Date   CORONARY ARTERY BYPASS GRAFT N/A 10/17/2018   Procedure: CORONARY ARTERY BYPASS GRAFTING (CABG), ON PUMP, TIMES FOUR, USING LIMA TO LAD, ENDOSCOPICALLY HARVESTED GREATER SAPHENOUS VEIN TO DIAG 2, SVG TO DISTAL RCA, AND LEFT RADIAL TO RAMUS INTERMEDIUS;  Surgeon: [name], MD;  Location: [facility] OR;  Service: Open Heart Surgery;  Laterality: N/A;  ENDOVEIN HARVEST OF GREATER SAPHENOUS VEIN Right 10/17/2018   Procedure: Mack Guise Of Greater Saphenous Vein;  Surgeon: [name], MD;  Location: [facility] OR;  Service: Open Heart Surgery;  Laterality: Right;   HAND SURGERY Left 1987   INGUINAL HERNIA REPAIR Bilateral 05/27/2015   Procedure: LAPAROSCOPIC BILATERAL  INGUINAL HERNIA REPAIRS WITH MESH;  Surgeon: Karie Soda, MD;  Location: [facility] ORS;  Service: General;  Laterality: Bilateral;   KNEE SURGERY     LEFT HEART CATH AND CORONARY  ANGIOGRAPHY N/A 10/14/2018   Procedure: LEFT HEART CATH AND CORONARY ANGIOGRAPHY;  Surgeon: Lyn Records, MD;  Location: [facility] INVASIVE CV LAB;  Service: Cardiovascular;  Laterality: N/A;   RADIAL ARTERY HARVEST Left 10/17/2018   Procedure: RADIAL ARTERY HARVEST;  Surgeon: [name], MD;  Location: [facility] OR;  Service: Open Heart Surgery;  Laterality: Left;   TEE WITHOUT CARDIOVERSION N/A 10/17/2018   Procedure: TRANSESOPHAGEAL ECHOCARDIOGRAM (TEE);  Surgeon: [name], MD;  Location: [facility] OR;  Service: Open Heart Surgery;  Laterality: N/A;     Current Meds  Medication Sig   acetaminophen (TYLENOL) 500 MG tablet Take 1,000 mg by mouth daily as needed for moderate pain or headache.   aspirin 81 MG tablet Take 1 tablet (81 mg total) by mouth daily.   atorvastatin (LIPITOR) 40 MG tablet Take 40 mg by mouth at bedtime.    ferrous sulfate 325 (65 FE) MG tablet Take 1 tablet (325 mg total) by mouth daily with breakfast. For one month;if develops constipation, may stop sooner.   folic acid (FOLVITE) 1 MG tablet Take 1 tablet (1 mg total) by mouth daily. For one month then stop.   isosorbide mononitrate (IMDUR) 30 MG 24 hr tablet Take 1 tablet (30 mg total) by mouth daily. For one month then stop.   metoprolol succinate (TOPROL-XL) 50 MG 24 hr tablet Take 1 tablet (50 mg total) by mouth daily.   oxyCODONE (OXY IR/ROXICODONE) 5 MG immediate release tablet Take 1 tablet (5 mg total) by mouth every 4 (four) hours as needed for severe pain.   sitaGLIPtin (JANUVIA) 100 MG tablet Take 1 tablet (100 mg total) by mouth at bedtime for 30 days.   tamsulosin (FLOMAX) 0.4 MG CAPS capsule Take 1 capsule (0.4 mg total) by mouth at bedtime for 30 days.     Allergies:   Antihistamines, diphenhydramine-type; Demerol; and Morphine and related   Social History   Tobacco Use   Smoking status: Never Smoker   Smokeless tobacco: Never Used  Substance Use Topics   Alcohol use: No   Drug use: No      Family Hx: The patient's family history includes Cancer in his father; Heart attack in his brother; Heart failure in his father; Heart murmur in his sister; Hypertension in his mother and sister; Sudden death in his brother.  ROS:   Please see the history of present illness.     All other systems reviewed and are negative.   Prior CV studies:   The following studies were reviewed today:  Cardiac Catheterization 10/14/2018 LAD prox 95; D1 ost 80; D2 ost 80 RCA mid 85  Carotid US 10/15/2018 Bilat ICA 1-39  Coronary CTA 10/06/2018 NON-CARDIAC IMPRESSION: No significant incidental findings.  CARDIAC Coronary Arteries: Right dominant with no anomalies LM: No plaque or stenosis. LAD system: Extensive mixed plaque with severe (70-90%) stenosis in the proximal LAD. D1 and D2 are small to moderate vessels and appear to have significant mixed plaque  proximally (70-90% stenosis). Circumflex system: Relatively small vessel, no plaque or stenosis. RCA system: The mid RCA is obscured by motion artifact in all phases. However, there is mixed plaque in this area as well. Cannot rule out severe (70-90%) stenosis.  IMPRESSION: 1. Coronary artery calcium score 448 Agatston units. This places the patient in the 98th percentile for age and gender, suggesting high risk of future cardiac events. 2. Suspected severe proximal LAD stenosis. There also appears to be significant disease in small-moderate D1 and D2. 3. Mid RCA is obscured by motion artifact but also has significant plaque. Possible severe (70-90%) stenosis.  FFR FINDINGS: FFR 0.61 mid D1 FFR 0.73 mid D2 FFR 0.74 mid LAD and <0.5 distal LAD Unable to interpret RCA due to artifact. No obstructive disease in LCx.  IMPRESSION: Hemodynamically significant stenosis in the proximal LAD and proximal D1 and D2. Unable to interpret RCA due to artifact. Suggest cardiac cath.   Echo 09/19/2018 EF 60-65  Echo 08/27/15 Mild  conc LVH, EF 65-70, no RWMA, trivial MR, trivial TR   Labs/Other Tests and Data Reviewed:    EKG:  No ECG reviewed.  Recent Labs: 09/18/2018: TSH 3.900 10/18/2018: ALT 31; Magnesium 2.3 10/25/2018: Hemoglobin 9.7; Platelets 363 10/26/2018: BUN 16; Creatinine, Ser 1.65; Potassium 4.3; Sodium 134   Recent Lipid Panel Lab Results  Component Value Date/Time   CHOL 198 09/19/2018 02:43 AM   TRIG 67 09/19/2018 02:43 AM   HDL 46 09/19/2018 02:43 AM   CHOLHDL 4.3 09/19/2018 02:43 AM   LDLCALC 139 (H) 09/19/2018 02:43 AM     Wt Readings from Last 3 Encounters:  11/01/18 185 lb 12.8 oz (84.3 kg)  10/28/18 190 lb (86.2 kg)  10/26/18 194 lb 12.8 oz (88.4 kg)     Objective:    Vital Signs:  BP 101/76    Pulse 87    Ht  (1.753 m)    Wt 185 lb 12.8 oz (84.3 kg)    BMI 27.44 kg/m    VITAL SIGNS:  reviewed GEN:  no acute distress EYES:  sclerae anicteric, EOMI - Extraocular Movements Intact RESPIRATORY:  normal respiratory effort NEURO:  alert and oriented x 3, no obvious focal deficit PSYCH:  normal affect  ASSESSMENT & PLAN:     Coronary artery disease involving native coronary artery of native heart without angina pectoris S/p CABG.  He is progressing well.  He still has some soreness and I have reassured him that this should improve over time.  He has a poor appetite.  I have asked him to try Glucerna to help with replacing calories until his appetite improves.  I have advised him that his pain medications may be causing some of his poor appetite and he is trying to limit his use of those.  He will remain on Imdur until it is discontinued by Dr. Tyrone Sage.  Continue ASA, statin, beta-blocker.  FU with me or Dr. Excell Seltzer in 3 mos.  AKI (acute kidney injury) (HCC) Creatinine improved prior to DC.  He has an appt with Dr. Tyrone Sage next week.  I will arrange a repeat BMET on the same day.    Essential hypertension The patient's blood pressure is controlled on his current regimen.   Continue current therapy.    Hyperlipidemia, unspecified hyperlipidemia type Continue statin Rx.  I will try to arrange follow up fasting lipids and LFTs at his appointment next week.   Type 2 diabetes mellitus without complication, without long-term current use of insulin (  HCC)  Recent A1c 6.3  Hopefully, as his BP comes up and his Creatinine improves, he can resume ACE inhibitor. (he was previously on Lisinopril).    COVID-19 Education: The signs and symptoms of COVID-19 were discussed with the patient and how to seek care for testing (follow up with PCP or arrange E-visit).  The importance of social distancing was discussed today.  Time:   Today, I have spent 22 minutes with the patient with telehealth technology discussing the above problems.     Medication Adjustments/Labs and Tests Ordered: Current medicines are reviewed at length with the patient today.  Concerns regarding medicines are outlined above.   Tests Ordered: Orders Placed This Encounter  Procedures   Basic metabolic panel  Lipid Panel Hepatic Function Panel   Medication Changes: No orders of the defined types were placed in this encounter.   Disposition:  Follow up in 3 month(s)  Signed, Tereso NewcomerScott Belle Charlie, PA-C  11/01/2018 3:06 PM     [facility]

## 2018-11-01 ENCOUNTER — Other Ambulatory Visit: Payer: Self-pay

## 2018-11-01 ENCOUNTER — Encounter: Payer: Self-pay | Admitting: Physician Assistant

## 2018-11-01 ENCOUNTER — Other Ambulatory Visit: Payer: Self-pay | Admitting: *Deleted

## 2018-11-01 ENCOUNTER — Telehealth (INDEPENDENT_AMBULATORY_CARE_PROVIDER_SITE_OTHER): Payer: BC Managed Care – PPO | Admitting: Physician Assistant

## 2018-11-01 ENCOUNTER — Telehealth: Payer: Self-pay | Admitting: *Deleted

## 2018-11-01 VITALS — BP 101/76 | HR 87 | Ht 69.0 in | Wt 185.8 lb

## 2018-11-01 DIAGNOSIS — N179 Acute kidney failure, unspecified: Secondary | ICD-10-CM

## 2018-11-01 DIAGNOSIS — E785 Hyperlipidemia, unspecified: Secondary | ICD-10-CM

## 2018-11-01 DIAGNOSIS — Z7189 Other specified counseling: Secondary | ICD-10-CM

## 2018-11-01 DIAGNOSIS — I10 Essential (primary) hypertension: Secondary | ICD-10-CM | POA: Diagnosis not present

## 2018-11-01 DIAGNOSIS — Z951 Presence of aortocoronary bypass graft: Secondary | ICD-10-CM | POA: Diagnosis not present

## 2018-11-01 DIAGNOSIS — I2510 Atherosclerotic heart disease of native coronary artery without angina pectoris: Secondary | ICD-10-CM | POA: Diagnosis not present

## 2018-11-01 DIAGNOSIS — E119 Type 2 diabetes mellitus without complications: Secondary | ICD-10-CM

## 2018-11-01 NOTE — Patient Instructions (Signed)
Medication Instructions:  Continue current medications.  If you need a refill on your cardiac medications before your next appointment, please call your pharmacy.   Lab work: We will arrange a follow up BMET on the day you see Dr. Tyrone Sage (11/10/2018)  If you have labs (blood work) drawn today and your tests are completely normal, you will receive your results only by: Marland Kitchen MyChart Message (if you have MyChart) OR . A paper copy in the mail If you have any lab test that is abnormal or we need to change your treatment, we will call you to review the results.  Testing/Procedures: None   Follow-Up: At [facility], you and your health needs are our priority.  As part of our continuing mission to provide you with exceptional heart care, we have created designated Provider Care Teams.  These Care Teams include your primary Cardiologist (physician) and Advanced Practice Providers (APPs -  Physician Assistants and Nurse Practitioners) who all work together to provide you with the care you need, when you need it. You will need a follow up appointment in:  3 months.  Please call our office 2 months in advance to schedule this appointment.  You may see Danny Bollman, MD or Danny Newcomer, PA-C   Any Other Special Instructions Will Be Listed Below (If Applicable).  Try to drink a Glucerna once or twice a day to help with adding calories until your appetite improves.

## 2018-11-01 NOTE — Telephone Encounter (Signed)
LVMOM FOR PT TO MAKE SURE  IS FASTING FOR LABS ON 6-3 AND TO CALL CALL OFFICE WITH ANY QUESTIONS OR CONCERNS

## 2018-11-07 ENCOUNTER — Telehealth: Payer: Self-pay | Admitting: *Deleted

## 2018-11-07 NOTE — Telephone Encounter (Signed)
    COVID-19 Pre-Screening Questions:  . In the past 7 to 10 days have you had a cough,  shortness of breath, headache, congestion, fever (100 or greater) body aches, chills, sore throat, or sudden loss of taste or sense of smell? . Have you been around anyone with known Covid 19. . Have you been around anyone who is awaiting Covid 19 test results in the past 7 to 10 days? . Have you been around anyone who has been exposed to Covid 19, or has mentioned symptoms of Covid 19 within the past 7 to 10 days?  If you have any concerns/questions about symptoms patients report during screening (either on the phone or at threshold). Contact the provider seeing the patient or DOD for further guidance.  If neither are available contact a member of the leadership team.    Contacted patient via phone call. All Covid 19 questions were no. Patient has a mask. KB        

## 2018-11-09 ENCOUNTER — Other Ambulatory Visit: Payer: BC Managed Care – PPO | Admitting: *Deleted

## 2018-11-09 ENCOUNTER — Other Ambulatory Visit: Payer: Self-pay | Admitting: Cardiothoracic Surgery

## 2018-11-09 ENCOUNTER — Other Ambulatory Visit: Payer: Self-pay

## 2018-11-09 DIAGNOSIS — E785 Hyperlipidemia, unspecified: Secondary | ICD-10-CM

## 2018-11-09 DIAGNOSIS — I10 Essential (primary) hypertension: Secondary | ICD-10-CM

## 2018-11-09 DIAGNOSIS — N179 Acute kidney failure, unspecified: Secondary | ICD-10-CM

## 2018-11-09 DIAGNOSIS — I25110 Atherosclerotic heart disease of native coronary artery with unstable angina pectoris: Secondary | ICD-10-CM

## 2018-11-09 LAB — LIPID PANEL
Chol/HDL Ratio: 4.1 ratio (ref 0.0–5.0)
Cholesterol, Total: 150 mg/dL (ref 100–199)
HDL: 37 mg/dL — ABNORMAL LOW (ref >39)
LDL Calculated: 94 mg/dL (ref 0–99)
Triglycerides: 95 mg/dL (ref 0–149)
VLDL Cholesterol Cal: 19 mg/dL (ref 5–40)

## 2018-11-09 LAB — HEPATIC FUNCTION PANEL
ALT: 19 IU/L (ref 0–44)
AST: 18 IU/L (ref 0–40)
Albumin: 4 g/dL (ref 4.0–5.0)
Alkaline Phosphatase: 108 IU/L (ref 39–117)
Bilirubin Total: 0.3 mg/dL (ref 0.0–1.2)
Bilirubin, Direct: 0.08 mg/dL (ref 0.00–0.40)
Total Protein: 7 g/dL (ref 6.0–8.5)

## 2018-11-09 LAB — BASIC METABOLIC PANEL
BUN/Creatinine Ratio: 14 (ref 9–20)
BUN: 19 mg/dL (ref 6–24)
CO2: 21 mmol/L (ref 20–29)
Calcium: 9.5 mg/dL (ref 8.7–10.2)
Chloride: 104 mmol/L (ref 96–106)
Creatinine, Ser: 1.38 mg/dL — ABNORMAL HIGH (ref 0.76–1.27)
GFR calc Af Amer: 69 mL/min/{1.73_m2} (ref >59)
GFR calc non Af Amer: 60 mL/min/{1.73_m2} (ref >59)
Glucose: 94 mg/dL (ref 65–99)
Potassium: 4.6 mmol/L (ref 3.5–5.2)
Sodium: 141 mmol/L (ref 134–144)

## 2018-11-10 ENCOUNTER — Telehealth: Payer: Self-pay | Admitting: *Deleted

## 2018-11-10 ENCOUNTER — Ambulatory Visit
Admission: RE | Admit: 2018-11-10 | Discharge: 2018-11-10 | Disposition: A | Discharge status: Home / Self Care | Payer: BC Managed Care – PPO | Source: Ambulatory Visit | Attending: Cardiothoracic Surgery | Admitting: Cardiothoracic Surgery

## 2018-11-10 ENCOUNTER — Other Ambulatory Visit: Payer: Self-pay

## 2018-11-10 ENCOUNTER — Ambulatory Visit (INDEPENDENT_AMBULATORY_CARE_PROVIDER_SITE_OTHER): Payer: Self-pay | Admitting: Cardiothoracic Surgery

## 2018-11-10 VITALS — BP 128/83 | HR 85 | Temp 97.5°F | Resp 20 | Ht 69.0 in | Wt 185.0 lb

## 2018-11-10 DIAGNOSIS — Z951 Presence of aortocoronary bypass graft: Secondary | ICD-10-CM

## 2018-11-10 DIAGNOSIS — I25110 Atherosclerotic heart disease of native coronary artery with unstable angina pectoris: Secondary | ICD-10-CM

## 2018-11-10 DIAGNOSIS — E785 Hyperlipidemia, unspecified: Secondary | ICD-10-CM

## 2018-11-10 MED ORDER — ATORVASTATIN CALCIUM 80 MG PO TABS: 80.0000 mg | ORAL_TABLET | Freq: Every day | ORAL | 1 refills | Status: DC

## 2018-11-10 NOTE — Telephone Encounter (Signed)
Pt aware of results and verbalized understanding.  

## 2018-11-10 NOTE — Progress Notes (Signed)
[address]       [place] [id number]             [phone]      Rozanna BoerRalph Crumby [facility] Medical Record #[id number] Date of Birth: 11-20-1970  Referring: Tonny Bollmanooper, Michael, MD Primary Care: Ileana LaddWong, Francis P, MD Primary Cardiologist: Tonny BollmanMichael Cooper, MD   Chief Complaint:   POST OP FOLLOW UP OPERATIVE REPORT DATE OF PROCEDURE:  10/17/2018 PREOPERATIVE DIAGNOSIS:  Unstable angina with 3-vessel coronary artery disease. POSTOPERATIVE DIAGNOSES:  Unstable angina with 3-vessel coronary artery disease. SURGICAL PROCEDURE:  Coronary artery bypass grafting x4 with the left internal mammary to the left anterior descending coronary artery, left radial artery to the ramus intermedius, reverse saphenous vein graft to the second diagonal, reverse saphenous  vein graft to the distal right coronary artery with left radial artery harvest and endoscopic right greater saphenous thigh harvest. SURGEON:  Sheliah PlaneEdward Farra Nikolic, MD  History of Present Illness:     Patient called office today after coronary artery bypass grafting 10/17/2018 and discharge home 4 days ago.  The patient was concerned about his right leg incision, and mild abdominal discomfort.  He notes that his bowels have not moved since since he was in the hospital.  He has had no nausea or vomiting.  No fever, no pleuritic chest pain, no leg swelling.  A chest x-ray was done today before he was seen in the office.      Past Medical History:  Diagnosis Date  . Carotid US 10/15/2018   Carotid US 10/2018: bilateral ICA 1-39  . Complication of anesthesia    Pt verble and "prophecizes" after anesthesia  . Coronary Artery Disease    Coronary CTA 09/2018: pLAD 70-90; pD1+pD2 70-90; mRCA mixed plaque - cannot rule out 70-90; FFR suggests hemodynamically significant stenosis // LHC 10/2018: 2v CAD >> s/p  CABG (L-LAD, L radial-RI, S-D2, S-RCA) with Dr. Tyrone SageGerhardt   . Diabetes mellitus   . Echocardiogram 09/2018    Echo 09/2018: EF  60-65  . GERD   . Hyperlipidemia   . Hypertension   . Inguinal hernia 2016   right  . Kidney stones   . Vision abnormalities      Social History   Tobacco Use  Smoking Status Never Smoker  Smokeless Tobacco Never Used    Social History   Substance and Sexual Activity  Alcohol Use No     Allergies  Allergen Reactions  . Antihistamines, Diphenhydramine-Type Hives and Other (See Comments)    Seizures  . Demerol Hives  . Morphine And Related Hives    Esp with high dose morphine, possibly Dilaudid Tolerates hydrocodone, oxycodone    Current Outpatient Medications  Medication Sig Dispense Refill  . acetaminophen (TYLENOL) 500 MG tablet Take 1,000 mg by mouth daily as needed for moderate pain or headache.    Marland Kitchen. aspirin 81 MG tablet Take 1 tablet (81 mg total) by mouth daily.    Marland Kitchen. atorvastatin (LIPITOR) 80 MG tablet Take 1 tablet (80 mg total) by mouth at bedtime. 90 tablet 1  . ferrous sulfate 325 (65 FE) MG tablet Take 1 tablet (325 mg total) by mouth daily with breakfast. For one month;if develops constipation, may stop sooner.  3  . folic acid (FOLVITE) 1 MG tablet Take 1 tablet (1 mg total) by mouth daily. For one month then stop.    . isosorbide mononitrate (IMDUR) 30 MG 24 hr tablet Take 1 tablet (30 mg total) by  mouth daily. For one month then stop. 30 tablet 0  . metoprolol succinate (TOPROL-XL) 50 MG 24 hr tablet Take 1 tablet (50 mg total) by mouth daily. 30 tablet 1  . oxyCODONE (OXY IR/ROXICODONE) 5 MG immediate release tablet Take 1 tablet (5 mg total) by mouth every 4 (four) hours as needed for severe pain. 30 tablet 0  . sitaGLIPtin (JANUVIA) 100 MG tablet Take 1 tablet (100 mg total) by mouth at bedtime for 30 days. 30 tablet 0  . tamsulosin (FLOMAX) 0.4 MG CAPS capsule Take 1 capsule (0.4 mg total) by mouth at bedtime for 30 days. 30 capsule 0   No current facility-administered medications for this visit.        Physical Exam: BP 128/83   Pulse 85    Temp (!) 97.5 F (36.4 C) (Skin)   Resp 20   Ht 5\' 9"  (1.753 m)   Wt 185 lb (83.9 kg)   SpO2 99% Comment: RA  BMI 27.32 kg/m   General appearance: alert, cooperative, appears stated age and no distress Neurologic: intact Heart: regular rate and rhythm, S1, S2 normal, no murmur, click, rub or gallop Lungs: clear to auscultation bilaterally Abdomen: soft, non-tender; bowel sounds normal; no masses,  no organomegaly Extremities: The right upper leg and a vein site was cleaned, incision was intact without evidence of infection, patient had some slight tenderness along the Endo harvest track with mild bruising Wound: Sternal incision is intact Patient's left arm burn site is healed he has good strength in his hands he does note some numbness in his index and middle finger on the left  Diagnostic Studies & Laboratory data:     Recent Radiology Findings:   Dg Chest 2 View  Result Date: 11/10/2018 CLINICAL DATA:  Coronary artery disease. EXAM: CHEST - 2 VIEW COMPARISON:  10/28/2010 FINDINGS: Normal heart size. There has been CABG. Trace pleural effusions. No pneumothorax or pulmonary edema. IMPRESSION: Improved aeration with trace residual pleural effusions. Electronically Signed   By: Marnee Spring M.D.   On: 11/10/2018 11:57   I have independently reviewed the above radiology studies  and reviewed the findings with the patient.    Recent Lab Findings: Lab Results  Component Value Date   WBC 9.8 10/25/2018   HGB 9.7 (L) 10/25/2018   HCT 30.0 (L) 10/25/2018   PLT 363 10/25/2018   GLUCOSE 94 11/09/2018   CHOL 150 11/09/2018   TRIG 95 11/09/2018   HDL 37 (L) 11/09/2018   LDLCALC 94 11/09/2018   ALT 19 11/09/2018   AST 18 11/09/2018   NA 141 11/09/2018   K 4.6 11/09/2018   CL 104 11/09/2018   CREATININE 1.38 (H) 11/09/2018   BUN 19 11/09/2018   CO2 21 11/09/2018   TSH 3.900 09/18/2018   INR 1.4 (H) 10/17/2018   HGBA1C 6.3 (H) 10/16/2018      Assessment / Plan:   Patient  appears stable after coronary artery bypass grafting, His sternal incisions are intact without evidence of infection He has no chest discomfort to suggest congestive heart failure or angina We will plan to see him back in 4 weeks He is aware to call the office should he have any further problems     [name] MD      [address] [place] [address] Office [phone]   Beeper [phone]  11/10/2018 3:28 PM

## 2018-11-15 ENCOUNTER — Other Ambulatory Visit: Payer: Self-pay

## 2018-11-15 ENCOUNTER — Other Ambulatory Visit: Payer: Self-pay | Admitting: *Deleted

## 2018-11-15 ENCOUNTER — Encounter (HOSPITAL_COMMUNITY)
Admission: RE | Admit: 2018-11-15 | Discharge: 2018-11-15 | Disposition: A | Discharge status: Home / Self Care | Payer: BC Managed Care – PPO | Source: Ambulatory Visit | Attending: Family Medicine | Admitting: Family Medicine

## 2018-11-15 ENCOUNTER — Other Ambulatory Visit: Payer: Self-pay | Admitting: Physician Assistant

## 2018-11-15 ENCOUNTER — Telehealth (HOSPITAL_COMMUNITY): Payer: Self-pay | Admitting: *Deleted

## 2018-11-15 DIAGNOSIS — Z951 Presence of aortocoronary bypass graft: Secondary | ICD-10-CM

## 2018-11-15 NOTE — Telephone Encounter (Signed)
Called pt regarding virtual cardiac rehab.  Talked to pt briefly regarding Virtual Cardiac Rehab. Pt is interested in participating in Virtual Cardiac Rehab. Pt advised that Virtual Cardiac Rehab is provided at no cost to the patient.  Checklist:  1. Pt has smart device  ie smartphone and/or ipad for downloading an app  Yes 2. Reliable internet/wifi service    Yes 3. Understands how to use their smartphone and navigate within an app Yes    Pt requested a call back in one hour.  Will schedule his intake appt for 2:00. Cherre Huger, BSN Cardiac and Training and development officer

## 2018-11-15 NOTE — Telephone Encounter (Signed)
         Confirm Consent - In the setting of the current Covid19 crisis, you are scheduled for a phone visit with your Cardiac or Pulmonary team member.  Just as we do with many in-gym visits, in order for you to participate in this visit, we must obtain consent.  If you'd like, I can send this to your mychart (if signed up) or email for you to review.  Otherwise, I can obtain your verbal consent now.  By agreeing to a telephone visit, we'd like you to understand that the technology does not allow for your Cardiac or Pulmonary Rehab team member to perform a physical assessment, and thus may limit their ability to fully assess your ability to perform exercise programs. If your provider identifies any concerns that need to be evaluated in person, we will make arrangements to do so.  Finally, though the technology is pretty good, we cannot assure that it will always work on either your or our end and we cannot ensure that we have a secure connection.  Cardiac and Pulmonary Rehab Telehealth visits and "At Home" cardiac and pulmonary rehab are provided at no cost to you.        Are you willing to proceed?"        STAFF: Did the patient verbally acknowledge consent to telehealth visit? Document YES/NO here: Yes    Maurice Small RN, BSN Cardiac and Pulmonary Rehab Nurse Navigator   Cardiac and Pulmonary Rehab Staff        Date 11/15/18     @  Time [phone]

## 2018-11-16 ENCOUNTER — Other Ambulatory Visit: Payer: Self-pay | Admitting: Cardiothoracic Surgery

## 2018-11-16 DIAGNOSIS — Z736 Limitation of activities due to disability: Secondary | ICD-10-CM

## 2018-11-17 ENCOUNTER — Other Ambulatory Visit: Payer: Self-pay | Admitting: Physician Assistant

## 2018-11-19 ENCOUNTER — Encounter (HOSPITAL_COMMUNITY): Payer: Self-pay | Admitting: *Deleted

## 2018-11-19 ENCOUNTER — Emergency Department (HOSPITAL_COMMUNITY)
Admission: EM | Admit: 2018-11-19 | Discharge: 2018-11-19 | Disposition: A | Discharge status: Home / Self Care | Payer: BC Managed Care – PPO | Attending: Emergency Medicine | Admitting: Emergency Medicine

## 2018-11-19 ENCOUNTER — Other Ambulatory Visit: Payer: Self-pay

## 2018-11-19 ENCOUNTER — Emergency Department (HOSPITAL_COMMUNITY): Payer: BC Managed Care – PPO

## 2018-11-19 DIAGNOSIS — E119 Type 2 diabetes mellitus without complications: Secondary | ICD-10-CM | POA: Diagnosis not present

## 2018-11-19 DIAGNOSIS — Z79899 Other long term (current) drug therapy: Secondary | ICD-10-CM | POA: Diagnosis not present

## 2018-11-19 DIAGNOSIS — Z7982 Long term (current) use of aspirin: Secondary | ICD-10-CM | POA: Diagnosis not present

## 2018-11-19 DIAGNOSIS — I25810 Atherosclerosis of coronary artery bypass graft(s) without angina pectoris: Secondary | ICD-10-CM | POA: Insufficient documentation

## 2018-11-19 DIAGNOSIS — I10 Essential (primary) hypertension: Secondary | ICD-10-CM | POA: Diagnosis not present

## 2018-11-19 DIAGNOSIS — R1011 Right upper quadrant pain: Secondary | ICD-10-CM | POA: Diagnosis present

## 2018-11-19 DIAGNOSIS — Z951 Presence of aortocoronary bypass graft: Secondary | ICD-10-CM

## 2018-11-19 LAB — CBC WITH DIFFERENTIAL/PLATELET
Abs Immature Granulocytes: 0.03 10*3/uL (ref 0.00–0.07)
Basophils Absolute: 0.1 10*3/uL (ref 0.0–0.1)
Basophils Relative: 1 %
Eosinophils Absolute: 0.5 10*3/uL (ref 0.0–0.5)
Eosinophils Relative: 6 %
HCT: 34.9 % — ABNORMAL LOW (ref 39.0–52.0)
Hemoglobin: 11.1 g/dL — ABNORMAL LOW (ref 13.0–17.0)
Immature Granulocytes: 0 %
Lymphocytes Relative: 25 %
Lymphs Abs: 2 10*3/uL (ref 0.7–4.0)
MCH: 27.8 pg (ref 26.0–34.0)
MCHC: 31.8 g/dL (ref 30.0–36.0)
MCV: 87.3 fL (ref 80.0–100.0)
Monocytes Absolute: 1 10*3/uL (ref 0.1–1.0)
Monocytes Relative: 12 %
Neutro Abs: 4.7 10*3/uL (ref 1.7–7.7)
Neutrophils Relative %: 56 %
Platelets: 247 10*3/uL (ref 150–400)
RBC: 4 MIL/uL — ABNORMAL LOW (ref 4.22–5.81)
RDW: 13.8 % (ref 11.5–15.5)
WBC: 8.3 10*3/uL (ref 4.0–10.5)
nRBC: 0 % (ref 0.0–0.2)

## 2018-11-19 LAB — PROTIME-INR
INR: 1 (ref 0.8–1.2)
Prothrombin Time: 13.2 seconds (ref 11.4–15.2)

## 2018-11-19 LAB — COMPREHENSIVE METABOLIC PANEL
ALT: 32 U/L (ref 0–44)
AST: 23 U/L (ref 15–41)
Albumin: 3.3 g/dL — ABNORMAL LOW (ref 3.5–5.0)
Alkaline Phosphatase: 79 U/L (ref 38–126)
Anion gap: 10 (ref 5–15)
BUN: 21 mg/dL — ABNORMAL HIGH (ref 6–20)
CO2: 21 mmol/L — ABNORMAL LOW (ref 22–32)
Calcium: 8.6 mg/dL — ABNORMAL LOW (ref 8.9–10.3)
Chloride: 106 mmol/L (ref 98–111)
Creatinine, Ser: 1.49 mg/dL — ABNORMAL HIGH (ref 0.61–1.24)
GFR calc Af Amer: >60 mL/min (ref >60)
GFR calc non Af Amer: 54 mL/min — ABNORMAL LOW (ref >60)
Glucose, Bld: 92 mg/dL (ref 70–99)
Potassium: 3.8 mmol/L (ref 3.5–5.1)
Sodium: 137 mmol/L (ref 135–145)
Total Bilirubin: 0.3 mg/dL (ref 0.3–1.2)
Total Protein: 6.7 g/dL (ref 6.5–8.1)

## 2018-11-19 LAB — TROPONIN I: Troponin I: <0.03 ng/mL (ref <0.03)

## 2018-11-19 LAB — LIPASE, BLOOD: Lipase: 24 U/L (ref 11–51)

## 2018-11-19 MED ORDER — IOHEXOL 300 MG/ML  SOLN
100.0000 mL | Freq: Once | INTRAMUSCULAR | Status: AC | PRN
  Administered 2018-11-19: 100 mL via INTRAVENOUS

## 2018-11-19 MED ORDER — FENTANYL CITRATE (PF) 100 MCG/2ML IJ SOLN
100.0000 ug | Freq: Once | INTRAMUSCULAR | Status: AC
  Administered 2018-11-19: 100 ug via INTRAVENOUS
  Filled 2018-11-19: qty 2

## 2018-11-19 MED ORDER — HYDROCODONE-ACETAMINOPHEN 5-325 MG PO TABS: 1.0000 | ORAL_TABLET | Freq: Four times a day (QID) | ORAL | 0 refills | Status: DC | PRN

## 2018-11-19 MED ORDER — HYDROMORPHONE HCL 1 MG/ML IJ SOLN: 1.0000 mg | Freq: Once | INTRAMUSCULAR | Status: DC

## 2018-11-19 NOTE — ED Notes (Signed)
IV nurse at bedside.

## 2018-11-19 NOTE — Discharge Instructions (Addendum)
Ibuprofen 600 mg every 6 hours as needed for pain.  Hydrocodone as prescribed as needed for pain not relieved with ibuprofen.  Follow-up with your primary doctor if symptoms or not improving in the next 3 to 4 days, and return to the ER if you develop worsening pain, high fever, difficulty breathing, or other new and concerning symptoms.

## 2018-11-19 NOTE — ED Triage Notes (Signed)
Pt is 4 weeks post op from CABGx4 reports having RUQ pain tonight causing nausea. Reports pain is constant and hurts on inspiration and movement

## 2018-11-19 NOTE — ED Provider Notes (Signed)
[facility] Provider Note   CSN: [id number] Arrival date & time: 11/19/18  0208     History   Chief Complaint Chief Complaint  Patient presents with  . Abdominal Pain    HPI Rozanna BoerRalph Mattia is a 48 y.o. male.     Patient is a 48 year old male with past medical history of hypertension, diabetes, coronary artery disease with CABG 1 month ago.  He presents today for evaluation of right upper quadrant pain.  This is been occurring intermittently since his bypass surgery.  It became worse this evening after eating.  He describes pain to the right upper quadrant with associated nausea.  It is worse when he breathes, moves, and pushes on the area.  He denies any shortness of breath, fever, or cough.  He denies any ill contacts.  The history is provided by the patient.  Abdominal Pain Pain location:  RUQ Pain quality: aching   Pain radiates to:  Does not radiate Pain severity:  Moderate Duration:  3 weeks Timing:  Intermittent Progression:  Worsening Chronicity:  New Relieved by:  Nothing Ineffective treatments:  Position changes, movement and palpation   Past Medical History:  Diagnosis Date  . Carotid US 10/15/2018   Carotid US 10/2018: bilateral ICA 1-39  . Complication of anesthesia    Pt verble and "prophecizes" after anesthesia  . Coronary Artery Disease    Coronary CTA 09/2018: pLAD 70-90; pD1+pD2 70-90; mRCA mixed plaque - cannot rule out 70-90; FFR suggests hemodynamically significant stenosis // LHC 10/2018: 2v CAD >> s/p  CABG (L-LAD, L radial-RI, S-D2, S-RCA) with Dr. Tyrone SageGerhardt   . Diabetes mellitus   . Echocardiogram 09/2018    Echo 09/2018: EF 60-65  . GERD   . Hyperlipidemia   . Hypertension   . Inguinal hernia 2016   right  . Kidney stones   . Vision abnormalities     Patient Active Problem List   Diagnosis Date Noted  . Essential hypertension 10/31/2018  . Hyperlipidemia 10/31/2018  . Type 2 diabetes mellitus without  complication, without long-term current use of insulin (HCC) 10/31/2018  . Coronary artery disease 10/17/2018  . Obstructive sleep apnea 10/15/2018  . CAD (coronary artery disease), native coronary artery 10/14/2018  . Chest pain with high risk of acute coronary syndrome 09/18/2018  . Abnormal MRI of head 03/31/2017  . Transient alteration of awareness 03/31/2017  . Snoring 03/31/2017  . Excessive daytime sleepiness 03/31/2017  . Back pain   . Paresthesias 08/26/2015  . Confusion 08/26/2015  . Sensory disturbance 08/26/2015  . Atypical chest pain 08/26/2015  . Dizziness and giddiness 06/06/2013  . Viral illness 06/06/2013  . GERD (gastroesophageal reflux disease) 06/06/2013    Past Surgical History:  Procedure Laterality Date  . CORONARY ARTERY BYPASS GRAFT N/A 10/17/2018   Procedure: CORONARY ARTERY BYPASS GRAFTING (CABG), ON PUMP, TIMES FOUR, USING LIMA TO LAD, ENDOSCOPICALLY HARVESTED GREATER SAPHENOUS VEIN TO DIAG 2, SVG TO DISTAL RCA, AND LEFT RADIAL TO RAMUS INTERMEDIUS;  Surgeon: Delight OvensGerhardt, Edward B, MD;  Location: [facility] OR;  Service: Open Heart Surgery;  Laterality: N/A;  . ENDOVEIN HARVEST OF GREATER SAPHENOUS VEIN Right 10/17/2018   Procedure: Mack GuiseEndovein Harvest Of Greater Saphenous Vein;  Surgeon: Delight OvensGerhardt, Edward B, MD;  [facility];  Service: Open Heart Surgery;  Laterality: Right;  . HAND SURGERY Left 1987  . INGUINAL HERNIA REPAIR Bilateral 05/27/2015   Procedure: LAPAROSCOPIC BILATERAL  INGUINAL HERNIA REPAIRS WITH MESH;  Surgeon: Karie SodaSteven Gross, MD;  Location: [facility] ORS;  Service: General;  Laterality: Bilateral;  . KNEE SURGERY    . LEFT HEART CATH AND CORONARY ANGIOGRAPHY N/A 10/14/2018   Procedure: LEFT HEART CATH AND CORONARY ANGIOGRAPHY;  Surgeon: Belva Crome, MD;  Location: [place] [facility];  Service: Cardiovascular;  Laterality: N/A;  . RADIAL ARTERY HARVEST Left 10/17/2018   Procedure: RADIAL ARTERY HARVEST;  Surgeon: Grace Isaac, MD;  Location: [place];   Service: Open Heart Surgery;  Laterality: Left;  . TEE WITHOUT CARDIOVERSION N/A 10/17/2018   Procedure: TRANSESOPHAGEAL ECHOCARDIOGRAM (TEE);  Surgeon: Grace Isaac, MD;  Location: [facility];  Service: Open Heart Surgery;  Laterality: N/A;        Home Medications    Prior to Admission medications   Medication Sig Start Date End Date Taking? Authorizing Provider  acetaminophen (TYLENOL) 500 MG tablet Take 1,000 mg by mouth daily as needed for moderate pain or headache.    [provider]  aspirin 81 MG tablet Take 1 tablet (81 mg total) by mouth daily. 10/11/18   Richardson Dopp T, PA-C  atorvastatin (LIPITOR) 80 MG tablet Take 1 tablet (80 mg total) by mouth at bedtime. 11/10/18   Richardson Dopp T, PA-C  ferrous sulfate 325 (65 FE) MG tablet Take 1 tablet (325 mg total) by mouth daily with breakfast. For one month;if develops constipation, may stop sooner. 10/26/18   Nani Skillern, PA-C  folic acid (FOLVITE) 1 MG tablet Take 1 tablet (1 mg total) by mouth daily. For one month then stop. 10/26/18   Nani Skillern, PA-C  isosorbide mononitrate (IMDUR) 30 MG 24 hr tablet Take 1 tablet (30 mg total) by mouth daily. 11/15/18   Richardson Dopp T, PA-C  metoprolol succinate (TOPROL-XL) 50 MG 24 hr tablet Take 1 tablet (50 mg total) by mouth daily. 10/26/18 10/26/19  Nani Skillern, PA-C  oxyCODONE (OXY IR/ROXICODONE) 5 MG immediate release tablet Take 1 tablet (5 mg total) by mouth every 4 (four) hours as needed for severe pain. 10/26/18   Nani Skillern, PA-C  sitaGLIPtin (JANUVIA) 100 MG tablet Take 1 tablet (100 mg total) by mouth at bedtime for 30 days. 09/19/18 11/10/18  Antonieta Pert, MD  tamsulosin (FLOMAX) 0.4 MG CAPS capsule Take 1 capsule (0.4 mg total) by mouth at bedtime for 30 days. 10/25/18 11/24/18  Nani Skillern, PA-C    Family History Family History  Problem Relation Age of Onset  . Hypertension Mother   . Cancer Father   . Heart failure Father   .  Sudden death Brother   . Heart attack Brother   . Hypertension Sister   . Heart murmur Sister     Social History Social History   Tobacco Use  . Smoking status: Never Smoker  . Smokeless tobacco: Never Used  Substance Use Topics  . Alcohol use: No  . Drug use: No     Allergies   Antihistamines, diphenhydramine-type; Demerol; and Morphine and related   Review of Systems Review of Systems  Gastrointestinal: Positive for abdominal pain.  All other systems reviewed and are negative.    Physical Exam Updated Vital Signs BP 121/81 (BP Location: Left Arm)   Pulse 79   Temp 98.3 F (36.8 C) (Oral)   Resp 20   SpO2 100%   Physical Exam Vitals signs and nursing note reviewed.  Constitutional:      General: He is not in acute distress.    Appearance: He is well-developed. He  is not diaphoretic.  HENT:     Head: Normocephalic and atraumatic.  Neck:     Musculoskeletal: Normal range of motion and neck supple.  Cardiovascular:     Rate and Rhythm: Normal rate and regular rhythm.     Heart sounds: No murmur. No friction rub.  Pulmonary:     Effort: Pulmonary effort is normal. No respiratory distress.     Breath sounds: Normal breath sounds. No wheezing or rales.  Abdominal:     General: Bowel sounds are normal. There is no distension.     Palpations: Abdomen is soft.     Tenderness: There is abdominal tenderness in the right upper quadrant. There is no right CVA tenderness, left CVA tenderness, guarding or rebound.  Musculoskeletal: Normal range of motion.  Skin:    General: Skin is warm and dry.     Findings: No erythema.  Neurological:     Mental Status: He is alert and oriented to person, place, and time.     Coordination: Coordination normal.      ED Treatments / Results  Labs (all labs ordered are listed, but only abnormal results are displayed) Labs Reviewed  CBC WITH DIFFERENTIAL/PLATELET  COMPREHENSIVE METABOLIC PANEL  LIPASE, BLOOD  PROTIME-INR   TROPONIN I    ED ECG REPORT   Date: 11/19/2018  Rate: 75  Rhythm: normal sinus rhythm  QRS Axis: normal  Intervals: normal  ST/T Wave abnormalities: normal  Conduction Disutrbances:none  Narrative Interpretation:   Old EKG Reviewed: unchanged  I have personally reviewed the EKG tracing and agree with the computerized printout as noted.   Radiology No results found.  Procedures Procedures (including critical care time)  Medications Ordered in ED Medications - No data to display   Initial Impression / Assessment and Plan / ED Course  I have reviewed the triage vital signs and the nursing notes.  Pertinent labs & imaging results that were available during my care of the patient were reviewed by me and considered in my medical decision making (see chart for details).  Patient with history of coronary artery disease with recent CABG presenting with complaints of pain in the right upper quadrant.  This is worse when he pushes on the area and when he lies on his side.  It got worse this evening after he ate grilled chicken.  Physical examination reveals tenderness in the right upper quadrant and right lower chest.  His lungs are clear.  Vitals are stable and he is afebrile.  Work-up reveals no elevation of white count, normal LFTs and lipase, and CT scan shows no evidence for gallbladder issues.  The only abnormal finding on the CT of chest abdomen and pelvis is that of small bilateral pleural effusions.  I am uncertain as to whether this is the cause of the patient's discomfort, however nothing else has been discovered.  It is possible the patient has some postsurgical inflammation or pleurisy.  He does not appear to be experiencing signs or symptoms that would be suggestive of infection.  Patient feeling better after fentanyl.  At this point, I feel as though discharge is appropriate.  He will be given medicine for his pain and advised to follow-up with primary doctor if not  improving.  Final Clinical Impressions(s) / ED Diagnoses   Final diagnoses:  None    ED Discharge Orders    None       Geoffery Lyonselo, Sabrinia Prien, MD 11/19/18 [phone]

## 2018-11-19 NOTE — ED Notes (Signed)
Unable to access peripheral IV despite several attempts , IV team consult ordered.  

## 2018-11-25 ENCOUNTER — Telehealth (HOSPITAL_COMMUNITY): Payer: Self-pay

## 2018-11-25 NOTE — Telephone Encounter (Signed)
Phone call to Pt to inquire about logging exercise in the virtual CR app. Pt has not been logging exercise but stated he has been exercising at home. Pt stated he would begin logging his exercise in the app each day.

## 2018-11-28 ENCOUNTER — Ambulatory Visit: Payer: BC Managed Care – PPO

## 2018-11-29 ENCOUNTER — Other Ambulatory Visit: Payer: Self-pay | Admitting: Cardiothoracic Surgery

## 2018-11-29 ENCOUNTER — Other Ambulatory Visit: Payer: Self-pay | Admitting: Family Medicine

## 2018-11-29 ENCOUNTER — Other Ambulatory Visit: Payer: Self-pay

## 2018-11-29 DIAGNOSIS — I25119 Atherosclerotic heart disease of native coronary artery with unspecified angina pectoris: Secondary | ICD-10-CM

## 2018-11-29 DIAGNOSIS — R1011 Right upper quadrant pain: Secondary | ICD-10-CM

## 2018-11-30 ENCOUNTER — Ambulatory Visit (INDEPENDENT_AMBULATORY_CARE_PROVIDER_SITE_OTHER): Payer: Self-pay | Admitting: Physician Assistant

## 2018-11-30 ENCOUNTER — Encounter: Payer: Self-pay | Admitting: Physician Assistant

## 2018-11-30 ENCOUNTER — Ambulatory Visit
Admission: RE | Admit: 2018-11-30 | Discharge: 2018-11-30 | Disposition: A | Discharge status: Home / Self Care | Payer: BC Managed Care – PPO | Source: Ambulatory Visit | Attending: Cardiothoracic Surgery | Admitting: Cardiothoracic Surgery

## 2018-11-30 VITALS — BP 132/88 | HR 73 | Temp 97.3°F | Resp 16 | Ht 69.0 in | Wt 185.0 lb

## 2018-11-30 DIAGNOSIS — I25119 Atherosclerotic heart disease of native coronary artery with unspecified angina pectoris: Secondary | ICD-10-CM

## 2018-11-30 DIAGNOSIS — Z951 Presence of aortocoronary bypass graft: Secondary | ICD-10-CM

## 2018-11-30 NOTE — Patient Instructions (Signed)
You may continue to gradually increase your physical activity as tolerated.  Refrain from any heavy lifting or strenuous use of your arms and shoulders until at least 8 weeks from the time of your surgery, and avoid activities that cause increased pain in your chest on the side of your surgical incision.  Otherwise you may continue to increase activities without any particular limitations.  Increase the intensity and duration of physical activity gradually.  You are encouraged to enroll and participate in the outpatient cardiac rehab program beginning as soon as practical.  Make every effort to keep your diabetes under very tight control.  Follow up closely with your primary care physician or endocrinologist and strive to keep their hemoglobin A1c levels as low as possible, preferably near or below 6.0 (6.3 at the time of surgery).  The long term benefits of strict control of diabetes are far reaching and critically important for your overall health and survival.  May begin driving as is not taking narcotic for pain

## 2018-11-30 NOTE — Progress Notes (Signed)
Coronary artery bypass grafting x4 with the left internal mammary to the left anterior descending coronary artery, left radial artery to the ramus intermedius, reverse saphenous vein graft to the second diagonal, reverse saphenous vein graft to the distal right coronary artery with left radial artery harvest and endoscopic right greater saphenous thigh harvest.  HPI:  Patient returns for routine postoperative follow-up having undergone a CABG x 4 on 10/17/2018 by Danny Brewer. The patient's early postoperative recovery while in the hospital was notable for elevated creatinine. His creatinine was down to 1.65 on 05/20 the day of discharge. He is ambulating several times daily and denies shortness of breath, chest pain, fever or cough. Of note, he did have RUQ abdominal pain and nausea on 06/13 and went to the ED. He states he did have gallbladder problems prior to heart surgery. CT showed the gall bladder to be unremarkable. He is going to have a HIDA scan as he continues to have intermittent RUQ pain. His only other complaint is left shoulder pain, which again he had prior to heart surgery.    Current Outpatient Medications  Medication Sig Dispense Refill  . acetaminophen (TYLENOL) 500 MG tablet Take 1,000 mg by mouth daily as needed for moderate pain or headache.    Marland Kitchen. aspirin 81 MG tablet Take 1 tablet (81 mg total) by mouth daily.    Marland Kitchen. atorvastatin (LIPITOR) 80 MG tablet Take 1 tablet (80 mg total) by mouth at bedtime. 90 tablet 1  . ferrous sulfate 325 (65 FE) MG tablet Take 1 tablet (325 mg total) by mouth daily with breakfast. For one month;if develops constipation, may stop sooner.  3  . folic acid (FOLVITE) 1 MG tablet Take 1 tablet (1 mg total) by mouth daily. For one month then stop.    Marland Kitchen. HYDROcodone-acetaminophen (NORCO) 5-325 MG tablet Take 1-2 tablets by mouth every 6 (six) hours as needed. 15 tablet 0  . isosorbide mononitrate (IMDUR) 30 MG 24 hr tablet Take 1 tablet (30 mg total) by  mouth daily. 90 tablet 3  . metoprolol succinate (TOPROL-XL) 50 MG 24 hr tablet Take 1 tablet (50 mg total) by mouth daily. 30 tablet 1  . oxyCODONE (OXY IR/ROXICODONE) 5 MG immediate release tablet Take 1 tablet (5 mg total) by mouth every 4 (four) hours as needed for severe pain. 30 tablet 0  . sitaGLIPtin (JANUVIA) 100 MG tablet Take 1 tablet (100 mg total) by mouth at bedtime for 30 days. 30 tablet 0  Vital Signs: BP 132/88, HR 73, RR 73, Oxygenation 98% on room air   Physical Exam: CV-RRR Pulmonary-Clear to auscultation bilaterally Abdomen-Soft, non tender, bowel sounds present Extremities-No LE edema Wounds-Clean and dry   Diagnostic Tests: CLINICAL DATA:  CABG, incision site pain  EXAM: CHEST - 2 VIEW  COMPARISON:  11/10/2018, CT chest, 11/19/2018  FINDINGS: Status post median sternotomy and CABG. Both lungs are clear. The visualized skeletal structures are unremarkable.  IMPRESSION: No acute abnormality of the lungs. Status post median sternotomy and CABG. Consider CT with contrast enhancement to further evaluate for CABG incision site fluid collection or other abnormality if suspected.   Electronically Signed   By: Danny PrimesAlex  Brewer M.D.   On: 11/30/2018 11:14  Impression and Plan: Overall, Danny Brewer is progressing well from coronary artery bypass grafting surgery. He had a virtual visit with Danny NewcomerScott Weaver PA-C on 05/26. He was considering restarting Lisinopril but decided not to. Danny Brewer just had complete lab work drawn at his primary  care physician's office yesterday, which we will follow up on. His systolic BP is in the 748'O but I will wait to restart ACE or ARB until next visit. His glucose is under good control and his HGA1C at the time of surgery was 6.3. I instructed him to stop Imdur as he was prescribed this for radial artery harvest and has been on it for 6 weeks. He is not taking any narcotic for pain so he was instructed he may begin driving. He has  been contacted by cardiac rehab, but because of COVID-19, outpatient rehab is on hold. He was instructed to continue sternal precautions for 1-2 more weeks;at which time, he may lift more than 10 pounds. I told him regarding left shoulder pain, he should see his PCP or an orthopedist to determine etiology of his pain.  He will return to see Dr. Servando Snare in 3-4 weeks.    Danny Skillern, PA-C [facility] [phone]

## 2018-12-02 ENCOUNTER — Telehealth: Payer: Self-pay

## 2018-12-02 NOTE — Telephone Encounter (Signed)
Contacted patient regarding recent labs for kidney function per [name], PA after CABG 10/17/2018 with Dr. Servando Snare and follow-up appointment with [name], PA on 11/30/2018.  Patient made aware that kidney function is normal and to continue his prescribed medication.  Patient aware and thanked me for the call.

## 2018-12-06 ENCOUNTER — Other Ambulatory Visit: Payer: Self-pay

## 2018-12-06 ENCOUNTER — Encounter (HOSPITAL_COMMUNITY)
Admission: RE | Admit: 2018-12-06 | Discharge: 2018-12-06 | Disposition: A | Discharge status: Home / Self Care | Payer: BC Managed Care – PPO | Source: Ambulatory Visit | Attending: Family Medicine | Admitting: Family Medicine

## 2018-12-06 DIAGNOSIS — R1011 Right upper quadrant pain: Secondary | ICD-10-CM | POA: Diagnosis present

## 2018-12-06 MED ORDER — TECHNETIUM TC 99M MEBROFENIN IV KIT
5.0000 | PACK | Freq: Once | INTRAVENOUS | Status: AC | PRN
  Administered 2018-12-06: 5 via INTRAVENOUS

## 2018-12-19 ENCOUNTER — Other Ambulatory Visit: Payer: Self-pay

## 2018-12-19 ENCOUNTER — Other Ambulatory Visit: Payer: Self-pay | Admitting: *Deleted

## 2018-12-19 DIAGNOSIS — I25119 Atherosclerotic heart disease of native coronary artery with unspecified angina pectoris: Secondary | ICD-10-CM

## 2018-12-19 NOTE — Progress Notes (Unsigned)
cp

## 2018-12-20 ENCOUNTER — Other Ambulatory Visit: Payer: Self-pay | Admitting: Physician Assistant

## 2018-12-20 ENCOUNTER — Ambulatory Visit (INDEPENDENT_AMBULATORY_CARE_PROVIDER_SITE_OTHER): Payer: Self-pay | Admitting: Cardiothoracic Surgery

## 2018-12-20 ENCOUNTER — Ambulatory Visit
Admission: RE | Admit: 2018-12-20 | Discharge: 2018-12-20 | Disposition: A | Discharge status: Home / Self Care | Payer: BC Managed Care – PPO | Source: Ambulatory Visit | Attending: Cardiothoracic Surgery | Admitting: Cardiothoracic Surgery

## 2018-12-20 ENCOUNTER — Encounter: Payer: Self-pay | Admitting: Cardiothoracic Surgery

## 2018-12-20 VITALS — BP 133/86 | HR 65 | Temp 97.5°F | Resp 16 | Ht 69.0 in | Wt 185.0 lb

## 2018-12-20 DIAGNOSIS — I25119 Atherosclerotic heart disease of native coronary artery with unspecified angina pectoris: Secondary | ICD-10-CM

## 2018-12-20 DIAGNOSIS — Z951 Presence of aortocoronary bypass graft: Secondary | ICD-10-CM

## 2018-12-20 NOTE — Progress Notes (Signed)
[name]       [place] [address]             [phone]      Danny Brewer [place] Medical Record #[id number] Date of Birth: 1970-12-23  Referring: Sherren Mocha, MD Primary Care: Vernie Shanks, MD Primary Cardiologist: Sherren Mocha, MD   Chief Complaint:   POST OP FOLLOW UP OPERATIVE REPORT DATE OF PROCEDURE:  10/17/2018 PREOPERATIVE DIAGNOSIS:  Unstable angina with 3-vessel coronary artery disease. POSTOPERATIVE DIAGNOSES:  Unstable angina with 3-vessel coronary artery disease. SURGICAL PROCEDURE:  Coronary artery bypass grafting x4 with the left internal mammary to the left anterior descending coronary artery, left radial artery to the ramus intermedius, reverse saphenous vein graft to the second diagonal, reverse saphenous  vein graft to the distal right coronary artery with left radial artery harvest and endoscopic right greater saphenous thigh harvest. SURGEON:  Lanelle Bal, MD  History of Present Illness:     Patient continues to do well postoperatively, now 9 weeks postop the patient notes a significant difference in his activity level and endurance.  What he thought was gallbladder pain has completely disappeared.  He has been diligent about his diabetes control checking his glucose daily.  Notes that it has not been over 130 since he is going home.  Patient said no signs or symptoms of congestive heart failure or recurrent angina.    Past Medical History:  Diagnosis Date  . Carotid US 10/15/2018   Carotid US 10/2018: bilateral ICA 1-39  . Complication of anesthesia    Pt verble and "prophecizes" after anesthesia  . Coronary Artery Disease    Coronary CTA 09/2018: pLAD 70-90; pD1+pD2 70-90; mRCA mixed plaque - cannot rule out 70-90; FFR suggests hemodynamically significant stenosis // LHC 10/2018: 2v CAD >> s/p  CABG (L-LAD, L radial-RI, S-D2, S-RCA) with Dr. Servando Snare   . Diabetes mellitus   . Echocardiogram 09/2018    Echo  09/2018: EF 60-65  . GERD   . Hyperlipidemia   . Hypertension   . Inguinal hernia 2016   right  . Kidney stones   . Vision abnormalities      Social History   Tobacco Use  Smoking Status Never Smoker  Smokeless Tobacco Never Used    Social History   Substance and Sexual Activity  Alcohol Use No     Allergies  Allergen Reactions  . Antihistamines, Diphenhydramine-Type Hives and Other (See Comments)    Seizures  . Demerol Hives  . Morphine And Related Hives    Esp with high dose morphine, possibly Dilaudid Tolerates hydrocodone, oxycodone    Current Outpatient Medications  Medication Sig Dispense Refill  . acetaminophen (TYLENOL) 500 MG tablet Take 1,000 mg by mouth daily as needed for moderate pain or headache.    Marland Kitchen aspirin 81 MG tablet Take 1 tablet (81 mg total) by mouth daily.    Marland Kitchen atorvastatin (LIPITOR) 80 MG tablet Take 1 tablet (80 mg total) by mouth at bedtime. 90 tablet 1  . folic acid (FOLVITE) 1 MG tablet Take 1 tablet (1 mg total) by mouth daily. For one month then stop.    . metoprolol succinate (TOPROL-XL) 50 MG 24 hr tablet Take 1 tablet (50 mg total) by mouth daily. 30 tablet 1  . sitaGLIPtin (JANUVIA) 100 MG tablet Take 1 tablet (100 mg total) by mouth at bedtime for 30 days. 30 tablet 0   No current facility-administered medications for this visit.  Physical Exam: BP 133/86 (BP Location: Right Arm, Patient Position: Sitting, Cuff Size: Normal)   Pulse 65   Temp (!) 97.5 F (36.4 C) Comment: thermal  Resp 16   Ht 5\' 9"  (1.753 m)   Wt 185 lb (83.9 kg)   SpO2 98% Comment: RA  BMI 27.32 kg/m  General appearance: alert, cooperative and no distress Head: Normocephalic, without obvious abnormality, atraumatic Neck: no adenopathy, no carotid bruit, no JVD, supple, symmetrical, trachea midline and thyroid not enlarged, symmetric, no tenderness/mass/nodules Resp: clear to auscultation bilaterally Cardio: regular rate and rhythm, S1, S2  normal, no murmur, click, rub or gallop GI: soft, non-tender; bowel sounds normal; no masses,  no organomegaly Extremities: extremities normal, atraumatic, no cyanosis or edema, Homans sign is negative, no sign of DVT and Left hand has good grip and sensation left hand has good grip and sensation Neurologic: Grossly normal Sternal incision is well-healed Right thigh vein harvest grafting site is well-healed Left forearm radial artery harvest is well-healed  Diagnostic Studies & Laboratory data:     Recent Radiology Findings:    Recent Lab Findings: Lab Results  Component Value Date   WBC 8.3 11/19/2018   HGB 11.1 (L) 11/19/2018   HCT 34.9 (L) 11/19/2018   PLT 247 11/19/2018   GLUCOSE 92 11/19/2018   CHOL 150 11/09/2018   TRIG 95 11/09/2018   HDL 37 (L) 11/09/2018   LDLCALC 94 11/09/2018   ALT 32 11/19/2018   AST 23 11/19/2018   NA 137 11/19/2018   K 3.8 11/19/2018   CL 106 11/19/2018   CREATININE 1.49 (H) 11/19/2018   BUN 21 (H) 11/19/2018   CO2 21 (L) 11/19/2018   TSH 3.900 09/18/2018   INR 1.0 11/19/2018   HGBA1C 6.3 (H) 10/16/2018    Wt Readings from Last 3 Encounters:  12/20/18 185 lb (83.9 kg)  11/30/18 185 lb (83.9 kg)  11/10/18 185 lb (83.9 kg)    Assessment / Plan:    Patient doing well now 9 weeks following coronary artery bypass grafting, without signs or symptoms of congestive heart failure or recurrent angina I stressed to her the importance of long-term diabetes control and he seems very motivated in this.   Patient will follow-up with cardiology in August. I have not made him a return appointment be seen surgery office but would be glad to see him in his or cardiology request anytime  Delight OvensEdward B Arleta Ostrum MD      [address] [place] [id number] Office [phone]   Beeper [phone]  12/20/2018 4:50 PM

## 2018-12-22 NOTE — Telephone Encounter (Signed)
OK to refill Metoprolol succinate.  Please send tamsulosin refill to primary care.

## 2018-12-27 ENCOUNTER — Telehealth (HOSPITAL_COMMUNITY): Payer: Self-pay

## 2018-12-27 NOTE — Telephone Encounter (Signed)
Pt inactive in [employer].  Letter mailed requesting patient to return call regarding this by 01/03/2019. If no response, will discharge from cardiac rehab program. 

## 2019-01-10 ENCOUNTER — Telehealth: Payer: Self-pay | Admitting: Physician Assistant

## 2019-01-10 NOTE — Telephone Encounter (Signed)
S/w pt due to pt calling in when to schedule a f/u.  Looks like a 3 month f/u. And pt is due in August.  Pt has a fasting lab appt in system for Sept 4 but would like to do lipid/lft same day as appt. If ok with Richardson Dopp, PA.  Will send Nicki Reaper a message to advise.

## 2019-01-10 NOTE — Telephone Encounter (Signed)
Okay to obtain fasting lipids and LFTs at appointment with me in late August. Richardson Dopp, PA-C 01/10/2019 5:50 PM

## 2019-01-10 NOTE — Telephone Encounter (Signed)
New Message:    Pt wants to know when does he needs his next apt with Richardson Dopp?

## 2019-01-30 NOTE — Progress Notes (Signed)
Cardiology Office Note:    Date:  01/31/2019   ID:  Danny Brewer, DOB 1970-09-15, MRN [id number]  PCP:  Ileana LaddWong, Francis P, MD  Cardiologist:  Tonny BollmanMichael Cooper, MD   Electrophysiologist:  None   Referring MD: Ileana LaddWong, Francis P, MD   Chief Complaint  Patient presents with  . Follow-up    CAD     History of Present Illness:    Danny Brewer is a 48 y.o. male with:  Coronary artery disease   s/p CABG 10/2018  Echocardiogram 4/20: EF 60-65  Diabetes mellitus 2  Hypertension   Hyperlipidemia    Mr. Danny LaymanLindsey returns for follow-up.  He is here alone.  He stopped taking atorvastatin secondary to abdominal discomfort as well as joint pains.  Recently, he has noted headaches after taking metoprolol.  He has also had some muscle fasciculations versus tremors.  He has an appoint with his primary care physician later today to check some blood work.  He has some discomfort related to keloids on his scars.  He has not had significant shortness of breath, orthopnea, leg swelling.  He has not had syncope.  Prior CV studies:   The following studies were reviewed today:  Cardiac Catheterization 10/14/2018 LAD prox 95; D1 ost 80; D2 ost 80 RCA mid 85  Carotid US 10/15/2018 Bilat ICA 1-39  Coronary CTA 10/06/2018 NON-CARDIACIMPRESSION: No significant incidental findings.  CARDIAC Coronary Arteries: Right dominant with no anomalies LM: No plaque or stenosis. LAD system: Extensive mixed plaque with severe (70-90%) stenosis in the proximal LAD. D1 and D2 are small to moderate vessels and appear to have significant mixed plaque proximally (70-90% stenosis). Circumflex system: Relatively small vessel, no plaque or stenosis. RCA system: The mid RCA is obscured by motion artifact in all phases. However, there is mixed plaque in this area as well. Cannot rule out severe (70-90%) stenosis.  IMPRESSION: 1. Coronary artery calcium score 448 Agatston units. This places the patient in the 98th  percentile for age and gender, suggesting high risk of future cardiac events. 2. Suspected severe proximal LAD stenosis. There also appears to be significant disease in small-moderate D1 and D2. 3. Mid RCA is obscured by motion artifact but also has significant plaque. Possible severe (70-90%) stenosis.  FFRFINDINGS: FFR 0.61 mid D1 FFR 0.73 mid D2 FFR 0.74 mid LAD and <0.5 distal LAD Unable to interpret RCA due to artifact. No obstructive disease in LCx.  IMPRESSION: Hemodynamically significant stenosis in the proximal LAD and proximal D1 and D2. Unable to interpret RCA due to artifact. Suggest cardiac cath.   Echo 09/19/2018 EF 60-65  Echo 08/27/15 Mild conc LVH, EF 65-70, no RWMA, trivial MR, trivial TR  Past Medical History:  Diagnosis Date  . Carotid US 10/15/2018   Carotid US 10/2018: bilateral ICA 1-39  . Complication of anesthesia    Pt verble and "prophecizes" after anesthesia  . Coronary Artery Disease    Coronary CTA 09/2018: pLAD 70-90; pD1+pD2 70-90; mRCA mixed plaque - cannot rule out 70-90; FFR suggests hemodynamically significant stenosis // LHC 10/2018: 2v CAD >> s/p  CABG (L-LAD, L radial-RI, S-D2, S-RCA) with Dr. Tyrone SageGerhardt   . Diabetes mellitus   . Echocardiogram 09/2018    Echo 09/2018: EF 60-65  . GERD   . Hyperlipidemia   . Hypertension   . Inguinal hernia 2016   right  . Kidney stones   . Vision abnormalities    Surgical Hx: The patient  has a past surgical history that includes  Knee surgery; Hand surgery (Left, 1987); Inguinal hernia repair (Bilateral, 05/27/2015); LEFT HEART CATH AND CORONARY ANGIOGRAPHY (N/A, 10/14/2018); Coronary artery bypass graft (N/A, 10/17/2018); Radial artery harvest (Left, 10/17/2018); TEE without cardioversion (N/A, 10/17/2018); and Endoharvest vein of greater saphenous vein (Right, 10/17/2018).   Current Medications: Current Meds  Medication Sig  . acetaminophen (TYLENOL) 500 MG tablet Take 1,000 mg by mouth daily as  needed for moderate pain or headache.  Marland Kitchen. aspirin 81 MG tablet Take 1 tablet (81 mg total) by mouth daily.  . metoprolol succinate (TOPROL-XL) 50 MG 24 hr tablet Take 1 tablet (50 mg total) by mouth daily. Take in the PM with or immediately following a meal.  . sitaGLIPtin (JANUVIA) 100 MG tablet Take 1 tablet (100 mg total) by mouth at bedtime for 30 days.  . tamsulosin (FLOMAX) 0.4 MG CAPS capsule Take 0.4 mg by mouth daily.  . [DISCONTINUED] metoprolol succinate (TOPROL-XL) 50 MG 24 hr tablet TAKE 1 TABLET BY MOUTH EVERY DAY     Allergies:   Antihistamines, diphenhydramine-type; Demerol; and Morphine and related   Social History   Tobacco Use  . Smoking status: Never Smoker  . Smokeless tobacco: Never Used  Substance Use Topics  . Alcohol use: No  . Drug use: No     Family Hx: The patient's family history includes Cancer in his father; Heart attack in his brother; Heart failure in his father; Heart murmur in his sister; Hypertension in his mother and sister; Sudden death in his brother.  ROS:   Please see the history of present illness.    ROS All other systems reviewed and are negative.   EKGs/Labs/Other Test Reviewed:    EKG:  EKG is not ordered today.  The ekg ordered today demonstrates n/a  Recent Labs: 09/18/2018: TSH 3.900 10/18/2018: Magnesium 2.3 11/19/2018: ALT 32; BUN 21; Creatinine, Ser 1.49; Hemoglobin 11.1; Platelets 247; Potassium 3.8; Sodium 137   Recent Lipid Panel Lab Results  Component Value Date/Time   CHOL 150 11/09/2018 09:17 AM   TRIG 95 11/09/2018 09:17 AM   HDL 37 (L) 11/09/2018 09:17 AM   CHOLHDL 4.1 11/09/2018 09:17 AM   CHOLHDL 4.3 09/19/2018 02:43 AM   LDLCALC 94 11/09/2018 09:17 AM    Physical Exam:    VS:  BP 128/78   Pulse 77   Ht 5\' 9"  (1.753 m)   Wt 191 lb 12.8 oz (87 kg)   SpO2 99%   BMI 28.32 kg/m     Wt Readings from Last 3 Encounters:  01/31/19 191 lb 12.8 oz (87 kg)  12/20/18 185 lb (83.9 kg)  11/30/18 185 lb (83.9 kg)      Physical Exam  Constitutional: He is oriented to person, place, and time. He appears well-developed and well-nourished. No distress.  HENT:  Head: Normocephalic and atraumatic.  Eyes: No scleral icterus.  Neck: No JVD present. No thyromegaly present.  Cardiovascular: Normal rate, regular rhythm and normal heart sounds.  No murmur heard. Pulmonary/Chest: Effort normal and breath sounds normal. He has no rales.  Abdominal: Soft. There is no hepatomegaly.  Musculoskeletal:        General: No edema.  Lymphadenopathy:    He has no cervical adenopathy.  Neurological: He is alert and oriented to person, place, and time.  Skin: Skin is warm and dry.  Psychiatric: He has a normal mood and affect.    ASSESSMENT & PLAN:    1. Coronary artery disease involving native coronary artery of native heart without  angina pectoris Status post CABG in May 2020.  He is overall doing well.  He had to stop statin therapy secondary to abdominal discomfort as well as joint discomfort.  He also reported some memory loss.  Continue aspirin.  Follow-up in 6 months.  2. Essential hypertension Blood pressure is well controlled.  He has had some symptoms recently that may be related to side effects from beta-blocker therapy.  If he continues to have issues, we could consider stopping his metoprolol and placing him on ACE inhibitor therapy.  3. Hyperlipidemia, unspecified hyperlipidemia type As noted, he had side effects to atorvastatin.  Stop atorvastatin.  Start rosuvastatin 10 mg daily.  Obtain follow-up fasting lipids LFTs in 3 months.  If he has intolerance to rosuvastatin, I will refer him to our lipid clinic for consideration of PCSK9 inhibitor therapy.  4. Type 2 diabetes mellitus without complication, without long-term current use of insulin ([place]) Continue follow-up with primary care.    Dispo:  Return in about 6 months (around 08/03/2019) for Routine Follow Up, w/ Dr. Burt Knack, or Richardson Dopp, PA-C,  (virtual or in-person).   Medication Adjustments/Labs and Tests Ordered: Current medicines are reviewed at length with the patient today.  Concerns regarding medicines are outlined above.  Tests Ordered: Orders Placed This Encounter  Procedures  . Hepatic function panel  . Lipid panel   Medication Changes: Meds ordered this encounter  Medications  . rosuvastatin (CRESTOR) 10 MG tablet    Sig: Take 1 tablet (10 mg total) by mouth daily. In the PM    Dispense:  90 tablet    Refill:  3  . metoprolol succinate (TOPROL-XL) 50 MG 24 hr tablet    Sig: Take 1 tablet (50 mg total) by mouth daily. Take in the PM with or immediately following a meal.    Dispense:  90 tablet    Refill:  2    Signed, Richardson Dopp, PA-C  01/31/2019 9:48 AM    [facility] [place], [place], [place]  [address] Phone: [phone]; Fax: [phone]

## 2019-01-31 ENCOUNTER — Encounter: Payer: Self-pay | Admitting: Physician Assistant

## 2019-01-31 ENCOUNTER — Ambulatory Visit (INDEPENDENT_AMBULATORY_CARE_PROVIDER_SITE_OTHER): Payer: BC Managed Care – PPO | Admitting: Physician Assistant

## 2019-01-31 ENCOUNTER — Other Ambulatory Visit: Payer: Self-pay

## 2019-01-31 ENCOUNTER — Ambulatory Visit: Payer: BC Managed Care – PPO | Admitting: Physician Assistant

## 2019-01-31 VITALS — BP 128/78 | HR 77 | Ht 69.0 in | Wt 191.8 lb

## 2019-01-31 DIAGNOSIS — E785 Hyperlipidemia, unspecified: Secondary | ICD-10-CM

## 2019-01-31 DIAGNOSIS — I2510 Atherosclerotic heart disease of native coronary artery without angina pectoris: Secondary | ICD-10-CM | POA: Diagnosis not present

## 2019-01-31 DIAGNOSIS — E119 Type 2 diabetes mellitus without complications: Secondary | ICD-10-CM

## 2019-01-31 DIAGNOSIS — I10 Essential (primary) hypertension: Secondary | ICD-10-CM

## 2019-01-31 MED ORDER — ROSUVASTATIN CALCIUM 10 MG PO TABS: 10.0000 mg | ORAL_TABLET | Freq: Every day | ORAL | 3 refills | Status: DC

## 2019-01-31 MED ORDER — METOPROLOL SUCCINATE ER 50 MG PO TB24: 50.0000 mg | ORAL_TABLET | Freq: Every day | ORAL | 2 refills | Status: DC

## 2019-01-31 NOTE — Patient Instructions (Signed)
Medication Instructions:  Your physician has recommended you make the following change in your medication:   1. STOP ATORVASTATIN   2. START ROSUVASTATIN 10 MG DAILY IN THE PM.  3. TAKE TOPROL IN THE PM.    If you need a refill on your cardiac medications before your next appointment, please call your pharmacy.   Lab work: TO BE DONE IN 3 MONTHS: LFTS, LIPIDS.  If you have labs (blood work) drawn today and your tests are completely normal, you will receive your results only by: Marland Kitchen MyChart Message (if you have MyChart) OR . A paper copy in the mail If you have any lab test that is abnormal or we need to change your treatment, we will call you to review the results.  Testing/Procedures: NONE  Follow-Up: At [facility], you and your health needs are our priority.  As part of our continuing mission to provide you with exceptional heart care, we have created designated Provider Care Teams.  These Care Teams include your primary Cardiologist (physician) and Advanced Practice Providers (APPs -  Physician Assistants and Nurse Practitioners) who all work together to provide you with the care you need, when you need it. You will need a follow up appointment in:  6 months.  Please call our office 2 months in advance to schedule this appointment.  You may see Sherren Mocha, MD or Richardson Dopp, PA-C   Any Other Special Instructions Will Be Listed Below (If Applicable).

## 2019-02-10 ENCOUNTER — Other Ambulatory Visit: Payer: BC Managed Care – PPO

## 2019-03-20 ENCOUNTER — Other Ambulatory Visit: Payer: Self-pay | Admitting: Physician Assistant

## 2019-03-30 ENCOUNTER — Other Ambulatory Visit: Payer: Self-pay | Admitting: Cardiovascular Disease

## 2019-03-30 NOTE — Telephone Encounter (Signed)
Pt pharmacy is requesting a refill on tamsulosin. Would Dr. Burt Knack like to refill this medication? Please address

## 2019-04-13 ENCOUNTER — Other Ambulatory Visit: Payer: Self-pay

## 2019-04-13 DIAGNOSIS — Z20822 Contact with and (suspected) exposure to covid-19: Secondary | ICD-10-CM

## 2019-04-14 LAB — NOVEL CORONAVIRUS, NAA: SARS-CoV-2, NAA: NOT DETECTED

## 2019-04-24 ENCOUNTER — Telehealth: Payer: Self-pay | Admitting: Physician Assistant

## 2019-04-24 NOTE — Telephone Encounter (Signed)
The patient states he got his Toprol refill and ever since he has had a dull headache and dizziness. He has taken Tylenol several times which improves the HA but it never is totally alleviated. His last blood pressure reading was 143/80s. He scheduled an appointment with Richardson Dopp tomorrow and wishes to keep it to discuss medications and potentially changing the Toprol. He will bring his BP monitor so he has several readings to review.

## 2019-04-24 NOTE — Telephone Encounter (Signed)
° ° ° °  STAT if patient feels like he/she is going to faint   1) Are you dizzy now? no  2) Do you feel faint or have you passed out? no  3) Do you have any other symptoms? headache  4) Have you checked your HR and BP (record if available)? 143/?

## 2019-04-24 NOTE — Progress Notes (Addendum)
Cardiology Office Note:    Date:  04/25/2019   ID:  Sanjay Broadfoot, DOB 13-Nov-1970, MRN [id number]  PCP:  Vernie Shanks, MD  Cardiologist:  Sherren Mocha, MD  Electrophysiologist:  None   Referring MD: Vernie Shanks, MD   Chief Complaint: dizziness and headache  History of Present Illness:    Danny Brewer is a 48 y.o. male with a history of CAD s/p CABG in 10/2018, mild bilateral carotid artery stenosis, hypertension, hyperlipidemia, type 2 diabetes mellitus, and GERD who is followed by Dr. Burt Knack and presents today for evaluation of dizziness.   Patient first seen in 09/2018 during hospitalization for evaluation of chest pain. Echo during that admission showed LVEF of 60-65%. Presentation atypical but given multiple risk factor and significant family history of CVD, patient underwent outpatient coronary CT. Coronary CT showed hemodynamically significant stenosis in the proximal LAD and proximal D1 and D2. Therefore, patient underwent left heart catheterization on 10/14/2018 that revealed 2-vessel CAD. CT surgery was consulted and patient underwent CABG x2 with LIMA to LAD, SVG to distal RCA, SVG to Diag 2, and left radial to ramus intermedius on 10/17/2018.  Patient was last seen by Richardson Dopp, PA-C, in 01/2019 at which time he was doing relatively well from a cardiac standpoint. He had stopped taking Lipitor due to abdominal discomfort and joint pains. He also noted some headaches after taking metoprolol and some muscle fasciculations vs tremors. He was started on Crestor 10mg  daily at that time and was advised to follow-up in 6 months.   Patient called our office yesterday reporting dizziness and continued headache with metoprolol and this appointment was scheduled.  Patient reports continued diffuse dull headache that he thinks is related to the metoprolol. Headache seemed to worsen after he last refill of metoprolol about 2 weeks ago. Sometimes it is so bad that he states he has to sit  down. He denies any associated stroke like symptoms including vision changes, slurred speech, unilateral numbness/weakness. Headache improves with Tylenol but does not completely resolve. He stopped taking all of his medications except for Aspirin on Sunday due to intense headache. He states this may have helped some but he still has a mild headache this morning. He also reported some dizziness and fatigue after taking metoprolol but no falls or syncope. He does have a history of migraines several years ago for which he saw a Neurologist for. Otherwise, doing well from a cardiac standpoint. No chest pain or shortness of breath. No CHF symptoms.   Of note, he was unable to tolerate Crestor due to GI symptoms.    Past Medical History:  Diagnosis Date   Carotid US 10/15/2018   Carotid US 10/2018: bilateral ICA 3-79   Complication of anesthesia    Pt verble and "prophecizes" after anesthesia   Coronary Artery Disease    Coronary CTA 09/2018: pLAD 70-90; pD1+pD2 70-90; mRCA mixed plaque - cannot rule out 70-90; FFR suggests hemodynamically significant stenosis // LHC 10/2018: 2v CAD >> s/p  CABG (L-LAD, L radial-RI, S-D2, S-RCA) with Dr. Servando Snare    Diabetes mellitus    Echocardiogram 09/2018    Echo 09/2018: EF 60-65   GERD    Hyperlipidemia    Hypertension    Inguinal hernia 2016   right   Kidney stones    Vision abnormalities     Past Surgical History:  Procedure Laterality Date   CORONARY ARTERY BYPASS GRAFT N/A 10/17/2018   Procedure: CORONARY ARTERY BYPASS GRAFTING (CABG), ON PUMP,  TIMES FOUR, USING LIMA TO LAD, ENDOSCOPICALLY HARVESTED GREATER SAPHENOUS VEIN TO DIAG 2, SVG TO DISTAL RCA, AND LEFT RADIAL TO RAMUS INTERMEDIUS;  Surgeon: [name], MD;  Location: [facility] OR;  Service: Open Heart Surgery;  Laterality: N/A;   ENDOVEIN HARVEST OF GREATER SAPHENOUS VEIN Right 10/17/2018   Procedure: Mack Guise Of Greater Saphenous Vein;  Surgeon: [name], MD;   Location: [facility] OR;  Service: Open Heart Surgery;  Laterality: Right;   HAND SURGERY Left 1987   INGUINAL HERNIA REPAIR Bilateral 05/27/2015   Procedure: LAPAROSCOPIC BILATERAL  INGUINAL HERNIA REPAIRS WITH MESH;  Surgeon: Karie Soda, MD;  Location: [facility] ORS;  Service: General;  Laterality: Bilateral;   KNEE SURGERY     LEFT HEART CATH AND CORONARY ANGIOGRAPHY N/A 10/14/2018   Procedure: LEFT HEART CATH AND CORONARY ANGIOGRAPHY;  Surgeon: Lyn Records, MD;  Location: [facility] INVASIVE CV LAB;  Service: Cardiovascular;  Laterality: N/A;   RADIAL ARTERY HARVEST Left 10/17/2018   Procedure: RADIAL ARTERY HARVEST;  Surgeon: [name], MD;  Location: [facility] OR;  Service: Open Heart Surgery;  Laterality: Left;   TEE WITHOUT CARDIOVERSION N/A 10/17/2018   Procedure: TRANSESOPHAGEAL ECHOCARDIOGRAM (TEE);  Surgeon: [name], MD;  Location: [facility] OR;  Service: Open Heart Surgery;  Laterality: N/A;    Current Medications: Current Meds  Medication Sig   aspirin 81 MG tablet Take 1 tablet (81 mg total) by mouth daily.     Allergies:   Antihistamines, diphenhydramine-type; Demerol; and Morphine and related   Social History   Socioeconomic History   Marital status: Married    Spouse name: Not on file   Number of children: Not on file   Years of education: Not on file   Highest education level: Not on file  Occupational History   Not on file  Social Needs   Financial resource strain: Not on file   Food insecurity    Worry: Not on file    Inability: Not on file   Transportation needs    Medical: Not on file    Non-medical: Not on file  Tobacco Use   Smoking status: Never Smoker   Smokeless tobacco: Never Used  Substance and Sexual Activity   Alcohol use: No   Drug use: No   Sexual activity: Not on file  Lifestyle   Physical activity    Days per week: Not on file    Minutes per session: Not on file   Stress: Not on file  Relationships   Social connections     Talks on phone: Not on file    Gets together: Not on file    Attends religious service: Not on file    Active member of club or organization: Not on file    Attends meetings of clubs or organizations: Not on file    Relationship status: Not on file  Other Topics Concern   Not on file  Social History Narrative   Not on file     Family History: The patient's family history includes Cancer in his father; Heart attack in his brother; Heart failure in his father; Heart murmur in his sister; Hypertension in his mother and sister; Sudden death in his brother.  ROS:   Please see the history of present illness.    All other systems reviewed and are negative.  EKGs/Labs/Other Studies Reviewed:    The following studies were reviewed today:  Echocardiogram 09/19/2018: Impressions: 1. The left ventricle has normal systolic function,  with an ejection fraction of 60-65%. The cavity size was normal. Left ventricular diastolic function could not be evaluated.  2. The right ventricle has normal systolc function. The cavity was normal. There is no increase in right ventricular wall thickness.  3. Aortic valve regurgitation was not assessed by color flow Doppler. _______________  Coronary CT 10/06/2018: Impressions: Hemodynamically significant stenosis in the proximal LAD and proximal D1 and D2. Unable to interpret RCA due to artifact. Suggest cardiac cath. _______________  Left Heart Catheterization 10/14/2018:  Diabetic with family history of premature coronary atherosclerosis.  Atypical and typical symptoms.  Recurring episodes of chest tightness at rest which is mimicked by left and right coronary contrast injections.  Symptoms in arm and neck are more continuous, positional, and not likely ischemic.  Severe two-vessel coronary disease with segmental 90 to 95% proximal to mid LAD, segmental 80% large first diagonal that supplies the distribution of the ramus intermedius or circumflex,  segmental 80% stenosis in the moderate-sized second diagonal that is contained within the LAD stenosis as a bifurcation lesion, and segmental mid to distal.  The patient is right dominant.  Circumflex is small.  Normal LVEDP  Recommendations:  In room consultation with [facility] CV surgeon Dr. Sheliah Plane.  After discussion with patient, his wife, and surgeon, I have decided to admit the patient and he will undergo arterial grafting to the LAD, diagonal, and probable saphenous vein grafting to the right coronary on Monday.  Decided against PCI because of the requirement for long stents in all segments and anticipated decreased durability over time given his young age and diabetes.  IV nitroglycerin, therapeutic Lovenox.  Screening COVID testing.  Sleep study as OP. Clinical history is strongly suggestive.  EKG:  EKG not ordered today.   Recent Labs: 09/18/2018: TSH 3.900 10/18/2018: Magnesium 2.3 11/19/2018: ALT 32; BUN 21; Creatinine, Ser 1.49; Hemoglobin 11.1; Platelets 247; Potassium 3.8; Sodium 137  Recent Lipid Panel    Component Value Date/Time   CHOL 150 11/09/2018 0917   TRIG 95 11/09/2018 0917   HDL 37 (L) 11/09/2018 0917   CHOLHDL 4.1 11/09/2018 0917   CHOLHDL 4.3 09/19/2018 0243   VLDL 13 09/19/2018 0243   LDLCALC 94 11/09/2018 0917    Physical Exam:    Vital Signs: BP 132/86    Pulse 81    Ht 5\' 9"  (1.753 m)    Wt 198 lb (89.8 kg)    SpO2 99%    BMI 29.24 kg/m     Wt Readings from Last 3 Encounters:  04/25/19 198 lb (89.8 kg)  01/31/19 191 lb 12.8 oz (87 kg)  12/20/18 185 lb (83.9 kg)     General: 48 y.o. African-American male in no acute distress. HEENT: Normocephalic and atraumatic. Sclera clear. EOMs intact. Neck: Supple. No carotid bruits. No JVD. Heart: RRR. Distinct S1 and S2. No murmurs, gallops, or rubs. Radial pulses 2+ and equal bilaterally. Lungs: No increased work of breathing. Clear to ausculation bilaterally. No wheezes, rhonchi, or rales.    Abdomen: Soft, non-distended, and non-tender to palpation. Bowel sounds present. MSK: Normal strength and tone for age. Extremities: No clubbing, cyanosis, or edema.    Skin: Warm and dry. Neuro: Alert and oriented x3. No focal deficits. Psych: Normal affect. Responds appropriately.   Assessment:    1. Nonintractable headache, unspecified chronicity pattern, unspecified headache type   2. Coronary artery disease involving native coronary artery of native heart without angina pectoris   3. Essential hypertension   4.  Hyperlipidemia, unspecified hyperlipidemia type   5. Type 2 diabetes mellitus with complication, without long-term current use of insulin (HCC)     Plan:    Headache Patient continues to report daily headache which he thinks is due to the metoprolol. He does have a remote history of migraines which he previously saw a Neurologist for. No associated stroke symptoms. Discussed transitioning to different beta-blocker or trying new class of medications such as ACEi/ARB. Patient wanted to try another beta-blocker. Therefore, will stop Toprol-XL 50mg  daily and start Coreg 6.25mg  twice daily. Patient has virtual visit with PCP later today. Also asked him to talk with PCP about headache. Reviewed stroke symptoms with patient and advised him to go to ED if he develops any of these.    CAD s/p CABG x4 S/p CABG x4 in 10/2018. Stable. No angina. Continue aspirin, beta-blocker, and statin. Patient has had 7 lb weight gain since last visit. Recommended staying active with 150 minutes of aerobic activity per week.   Hypertension BP well relatively well controlled at 132/86 today and this is without Toprol for the last 2 days. Will transition to Coreg as above. If BP remains elevated after trying Coreg, could consider trying low dose ACEi/ARB for renal protection given diabetes.  Hyperlipidemia LDL 94 in 11/2018. LDL goal <70 given CAD. He was unable to tolerate Lipitor due to GI symptoms  and was changed to Crestor at last visit. However, he was unable to tolerate this either due to GI symptoms. Will try Zetia 10mg  daily. Will recheck fasting lipid panel/LFTs in 2 months. If he is unable to tolerate Zetia, may need to refer to lipid clinic for consideration of PCSK9 inhibitor.  Type 2 Diabetes Mellitus On Januvia at home. Managed by PCP.  Disposition: Patient already has follow-up scheduled with Tereso NewcomerScott Weaver, PA-C, in 07/2018.   Medication Adjustments/Labs and Tests Ordered: Current medicines are reviewed at length with the patient today.  Concerns regarding medicines are outlined above.  Orders Placed This Encounter  Procedures   Lipid panel   Hepatic function panel   Meds ordered this encounter  Medications   ezetimibe (ZETIA) 10 MG tablet    Sig: Take 1 tablet (10 mg total) by mouth daily.    Dispense:  30 tablet    Refill:  3   carvedilol (COREG) 6.25 MG tablet    Sig: Take 1 tablet (6.25 mg total) by mouth 2 (two) times daily.    Dispense:  60 tablet    Refill:  3    Patient Instructions  Your physician has recommended you make the following change in your medication:   START ZETIA 10 MG EVERY DAY  STOP METOPROLOL START CARVEDILOL 6.25 MG 1 TAB TWICE DAILY   Your physician recommends that you return for lab work in: 2 MONTHS  FASTING  LIPID AND LIVER  DUE MID OF January 2021  Your physician recommends that you schedule a follow-up appointment in: AS SCHEDULED     Signed, Corrin ParkerCallie E Kauan Kloosterman, PA-C  04/25/2019 10:36 AM    [facility]

## 2019-04-25 ENCOUNTER — Encounter: Payer: Self-pay | Admitting: *Deleted

## 2019-04-25 ENCOUNTER — Encounter: Payer: Self-pay | Admitting: Physician Assistant

## 2019-04-25 ENCOUNTER — Ambulatory Visit (INDEPENDENT_AMBULATORY_CARE_PROVIDER_SITE_OTHER): Payer: BC Managed Care – PPO | Admitting: Student

## 2019-04-25 ENCOUNTER — Other Ambulatory Visit: Payer: Self-pay | Admitting: Physician Assistant

## 2019-04-25 ENCOUNTER — Other Ambulatory Visit: Payer: Self-pay

## 2019-04-25 VITALS — BP 132/86 | HR 81 | Ht 69.0 in | Wt 198.0 lb

## 2019-04-25 DIAGNOSIS — I2510 Atherosclerotic heart disease of native coronary artery without angina pectoris: Secondary | ICD-10-CM

## 2019-04-25 DIAGNOSIS — I10 Essential (primary) hypertension: Secondary | ICD-10-CM | POA: Diagnosis not present

## 2019-04-25 DIAGNOSIS — R519 Headache, unspecified: Secondary | ICD-10-CM | POA: Diagnosis not present

## 2019-04-25 DIAGNOSIS — E785 Hyperlipidemia, unspecified: Secondary | ICD-10-CM | POA: Diagnosis not present

## 2019-04-25 DIAGNOSIS — E118 Type 2 diabetes mellitus with unspecified complications: Secondary | ICD-10-CM

## 2019-04-25 MED ORDER — CARVEDILOL 6.25 MG PO TABS: 6.2500 mg | ORAL_TABLET | Freq: Two times a day (BID) | ORAL | 3 refills | Status: DC

## 2019-04-25 MED ORDER — EZETIMIBE 10 MG PO TABS: 10.0000 mg | ORAL_TABLET | Freq: Every day | ORAL | 3 refills | Status: DC

## 2019-04-25 NOTE — Addendum Note (Signed)
Addended by: Sande Rives on: 04/25/2019 10:36 AM   Modules accepted: Orders

## 2019-04-25 NOTE — Patient Instructions (Addendum)
Your physician has recommended you make the following change in your medication:   START ZETIA 10 MG EVERY DAY  STOP METOPROLOL START CARVEDILOL 6.25 MG 1 TAB TWICE DAILY   Your physician recommends that you return for lab work in: [place] MID OF January 2021  Your physician recommends that you schedule a follow-up appointment in: AS SCHEDULED

## 2019-05-03 ENCOUNTER — Other Ambulatory Visit: Payer: BC Managed Care – PPO

## 2019-05-09 ENCOUNTER — Ambulatory Visit: Payer: BC Managed Care – PPO | Admitting: Neurology

## 2019-05-11 ENCOUNTER — Ambulatory Visit: Payer: BC Managed Care – PPO | Admitting: Neurology

## 2019-05-16 ENCOUNTER — Encounter: Payer: Self-pay | Admitting: Neurology

## 2019-05-25 NOTE — Progress Notes (Signed)
Cardiology Office Note:    Date:  05/26/2019   ID:  Danny Brewer, DOB 1971-05-03, MRN [id number]  PCP:  Ileana LaddWong, Francis P, MD  Cardiologist:  Tonny BollmanMichael Cooper, MD  Electrophysiologist:  None   Referring MD: Ileana LaddWong, Francis P, MD   Chief Complaint  Patient presents with  . Chest Pain    History of Present Illness:    Danny Brewer is a 48 y.o. male with:   Coronary artery disease  ? s/p CABG 10/2018 ? Echocardiogram 4/20: EF 60-65  Diabetes mellitus 2  Hypertension   Hyperlipidemia   Mr. Danny Brewer was last seen by Danny Skiffallie Goodrich, PA-C in 04/2019.  He was having headaches that he attributed to the Metoprolol succinate.  He wanted to continue beta-blocker therapy and was switched to Carvedilol.    He returns for evaluation of chest discomfort.  Over the past few days, he has had some discomfort that comes on after eating meals.  He has not had exertional chest discomfort.  He does have some dyspnea with exertion.  He has not had significant fatigue.  He has not had orthopnea or leg swelling.  He has also had some shoulder discomfort with positional changes.  He has seen orthopedics.  They have recommended proceeding with cortisone injection.  He was hesitant to do this.  He wanted to make sure his symptoms were not cardiac.  He also notes eczema on his chest and his chest is somewhat tender to the touch.   Prior CV studies:   The following studies were reviewed today:   Cardiac Catheterization5/01/2019 LAD prox 95; D1 ost 80; D2 ost 80 RCA mid 85  Carotid US 10/15/2018 Bilat ICA 1-39  Coronary CTA 10/06/2018 NON-CARDIACIMPRESSION: No significant incidental findings.  CARDIAC Coronary Arteries: Right dominant with no anomalies LM: No plaque or stenosis. LAD system: Extensive mixed plaque with severe (70-90%) stenosis in the proximal LAD. D1 and D2 are small to moderate vessels and appear to have significant mixed plaque proximally (70-90% stenosis). Circumflex system:  Relatively small vessel, no plaque or stenosis. RCA system: The mid RCA is obscured by motion artifact in all phases. However, there is mixed plaque in this area as well. Cannot rule out severe (70-90%) stenosis.  IMPRESSION: 1. Coronary artery calcium score 448 Agatston units. This places the patient in the 98th percentile for age and gender, suggesting high risk of future cardiac events. 2. Suspected severe proximal LAD stenosis. There also appears to be significant disease in small-moderate D1 and D2. 3. Mid RCA is obscured by motion artifact but also has significant plaque. Possible severe (70-90%) stenosis.  FFRFINDINGS: FFR 0.61 mid D1 FFR 0.73 mid D2 FFR 0.74 mid LAD and <0.5 distal LAD Unable to interpret RCA due to artifact. No obstructive disease in LCx.  IMPRESSION: Hemodynamically significant stenosis in the proximal LAD and proximal D1 and D2. Unable to interpret RCA due to artifact. Suggest cardiac cath.   Echo 09/19/2018 EF 60-65  Echo 08/27/15 Mild conc LVH, EF 65-70, no RWMA, trivial MR, trivial TR  Past Medical History:  Diagnosis Date  . Carotid US 10/15/2018   Carotid US 10/2018: bilateral ICA 1-39  . Complication of anesthesia    Pt verble and "prophecizes" after anesthesia  . Coronary Artery Disease    Coronary CTA 09/2018: pLAD 70-90; pD1+pD2 70-90; mRCA mixed plaque - cannot rule out 70-90; FFR suggests hemodynamically significant stenosis // LHC 10/2018: 2v CAD >> s/p  CABG (L-LAD, L radial-RI, S-D2, S-RCA) with Dr. Tyrone SageGerhardt   .  Diabetes mellitus   . Echocardiogram 09/2018    Echo 09/2018: EF 60-65  . GERD   . Hyperlipidemia   . Hypertension   . Inguinal hernia 2016   right  . Kidney stones   . Vision abnormalities    Surgical Hx: The patient  has a past surgical history that includes Knee surgery; Hand surgery (Left, 1987); Inguinal hernia repair (Bilateral, 05/27/2015); LEFT HEART CATH AND CORONARY ANGIOGRAPHY (N/A, 10/14/2018); Coronary  artery bypass graft (N/A, 10/17/2018); Radial artery harvest (Left, 10/17/2018); TEE without cardioversion (N/A, 10/17/2018); and Endoharvest vein of greater saphenous vein (Right, 10/17/2018).   Current Medications: Current Meds  Medication Sig  . aspirin 81 MG tablet Take 1 tablet (81 mg total) by mouth daily.  Marland Kitchen ezetimibe (ZETIA) 10 MG tablet Take 1 tablet (10 mg total) by mouth daily.  . metoprolol tartrate (LOPRESSOR) 12.5 mg TABS tablet Take 12.5 mg by mouth daily.  . rosuvastatin (CRESTOR) 10 MG tablet Take 10 mg by mouth daily.  . sitaGLIPtin (JANUVIA) 100 MG tablet Take 100 mg by mouth daily.  . tamsulosin (FLOMAX) 0.4 MG CAPS capsule Take 0.4 mg by mouth daily.     Allergies:   Antihistamines, diphenhydramine-type; Demerol; and Morphine and related   Social History   Tobacco Use  . Smoking status: Never Smoker  . Smokeless tobacco: Never Used  Substance Use Topics  . Alcohol use: No  . Drug use: No     Family Hx: The patient's family history includes Cancer in his father; Heart attack in his brother; Heart failure in his father; Heart murmur in his sister; Hypertension in his mother and sister; Sudden death in his brother.  ROS:   Please see the history of present illness.    ROS All other systems reviewed and are negative.   EKGs/Labs/Other Test Reviewed:    EKG:  EKG is  ordered today.  The ekg ordered today demonstrates normal sinus rhythm, heart rate 66, normal axis, QTC 413, no ST-T wave changes  Recent Labs: 09/18/2018: TSH 3.900 10/18/2018: Magnesium 2.3 11/19/2018: ALT 32; BUN 21; Creatinine, Ser 1.49; Hemoglobin 11.1; Platelets 247; Potassium 3.8; Sodium 137   Recent Lipid Panel Lab Results  Component Value Date/Time   CHOL 150 11/09/2018 09:17 AM   TRIG 95 11/09/2018 09:17 AM   HDL 37 (L) 11/09/2018 09:17 AM   CHOLHDL 4.1 11/09/2018 09:17 AM   CHOLHDL 4.3 09/19/2018 02:43 AM   LDLCALC 94 11/09/2018 09:17 AM    Physical Exam:    VS:  BP 132/84    Pulse 80   Ht 5\' 9"  (1.753 m)   Wt 200 lb (90.7 kg)   SpO2 98%   BMI 29.53 kg/m     Wt Readings from Last 3 Encounters:  05/26/19 200 lb (90.7 kg)  04/25/19 198 lb (89.8 kg)  01/31/19 191 lb 12.8 oz (87 kg)     Physical Exam  Constitutional: He is oriented to person, place, and time. He appears well-developed and well-nourished. No distress.  HENT:  Head: Normocephalic and atraumatic.  Eyes: No scleral icterus.  Neck: No JVD present. No thyromegaly present.  Cardiovascular: Normal rate, regular rhythm and normal heart sounds.  No murmur heard. Physiologically split S2  Pulmonary/Chest: Effort normal and breath sounds normal. He has no rales.  Abdominal: Soft. There is no hepatomegaly.  Musculoskeletal:        General: No edema.     Comments: +crepitus with passive ROM L shoulder  Lymphadenopathy:  He has no cervical adenopathy.  Neurological: He is alert and oriented to person, place, and time.  Skin: Skin is warm and dry.  Psychiatric: He has a normal mood and affect.    ASSESSMENT & PLAN:    1. Other chest pain He has had some atypical chest discomfort.  This is not like his previous angina.  It seems to be related to meals.  He does note having an EGD last year that was unremarkable.  I suspect his symptoms are all related to dyspepsia.  ECG is normal.  I do not think he has symptoms consistent with early graft failure.  I have recommended taking an acids for a couple of weeks to see if this improves his symptoms.  If his symptoms should continue despite this, we can pursue a [employer].  He has an appointment with me in February.  He should contact me sooner if his symptoms continue so that we can arrange a stress test.  I have reassured him that his shoulder pain seems to be related to some type of rotator cuff pathology.  He should continue follow-up with orthopedics.  2. Coronary artery disease involving native coronary artery of native heart without angina  pectoris Status post CABG May 2020.  As noted, I do not believe his symptoms are related to angina.  Continue aspirin.  He is unsure of his medications.  We think he may be just taking ezetimibe.  He has follow-up lipids pending in January.  He will contact our office later today to confirm his medications.  3. Essential hypertension Blood pressure is controlled.  He thinks that he is taking metoprolol tartrate 12.5 mg at bedtime.  I would prefer he be on metoprolol succinate 25 mg daily.  He will confirm his medications when he gets home.  If he is on tartrate, I will switch him to succinate 25 mg at bedtime.  I have asked him to continue to monitor his blood pressure.  4. Hyperlipidemia, unspecified hyperlipidemia type Continue current therapy.  As noted, he will confirm his medications.  He has a follow-up lipids pending next month.   Dispo:  Return in about 2 months (around 07/27/2019) for Scheduled Follow Up, w/ Tereso Newcomer, PA-C.   Medication Adjustments/Labs and Tests Ordered: Current medicines are reviewed at length with the patient today.  Concerns regarding medicines are outlined above.  Tests Ordered: Orders Placed This Encounter  Procedures  . EKG 12-Lead   Medication Changes: No orders of the defined types were placed in this encounter.   Signed, Tereso Newcomer, PA-C  05/26/2019 2:09 PM    [facility] [address] [place], [place], [place]  [address] Phone: [phone]; Fax: [phone]

## 2019-05-26 ENCOUNTER — Other Ambulatory Visit: Payer: Self-pay

## 2019-05-26 ENCOUNTER — Encounter: Payer: Self-pay | Admitting: Physician Assistant

## 2019-05-26 ENCOUNTER — Ambulatory Visit (INDEPENDENT_AMBULATORY_CARE_PROVIDER_SITE_OTHER): Payer: BC Managed Care – PPO | Admitting: Physician Assistant

## 2019-05-26 VITALS — BP 132/84 | HR 80 | Ht 69.0 in | Wt 200.0 lb

## 2019-05-26 DIAGNOSIS — I10 Essential (primary) hypertension: Secondary | ICD-10-CM | POA: Diagnosis not present

## 2019-05-26 DIAGNOSIS — I2510 Atherosclerotic heart disease of native coronary artery without angina pectoris: Secondary | ICD-10-CM | POA: Diagnosis not present

## 2019-05-26 DIAGNOSIS — E785 Hyperlipidemia, unspecified: Secondary | ICD-10-CM | POA: Diagnosis not present

## 2019-05-26 DIAGNOSIS — R0789 Other chest pain: Secondary | ICD-10-CM

## 2019-05-26 NOTE — Patient Instructions (Addendum)
Medication Instructions:   Your physician has recommended you make the following change in your medication:   1) Start Pepcid 20MG , 1 tablet by mouth twice a day for 2 weeks, and then take as needed. You can get Pepcid over the counter at the pharmacy.  *If you need a refill on your cardiac medications before your next appointment, please call your pharmacy*  Lab Work:  None ordered today  Testing/Procedures:  None ordered today  Follow-Up:  You have lab work scheduled for 06/22/19 at 9:30AM and a follow up with Richardson Dopp on 08/02/19 at 2:15PM.  Other Instructions  Please check your medications and call [phone] or send a mychart message with your list of medications so we can verify what you are taking. When you finish the 2 week course of Pepcid twice a day, call us if you are still having chest pain.

## 2019-05-29 ENCOUNTER — Telehealth: Payer: Self-pay | Admitting: Cardiovascular Disease

## 2019-05-29 NOTE — Telephone Encounter (Signed)
Agree.  Start Pepcid and take every day for 2 weeks. If symptoms persist despite taking Pepcid, call so that we can arrange the stress test. Richardson Dopp, PA-C    05/29/2019 11:49 AM

## 2019-05-29 NOTE — Telephone Encounter (Signed)
I spoke with pt and gave him information from [place], [place].  He has the following medications with him and confirms he is taking--ASA 81 mg daily, Ezetimibe 10 mg daily, Januvia 100 mg daily and flomax.Marland Kitchen He does not have metoprolol with him but states it is at home and he takes it daily. He thinks dose is 50 mg.  He is not taking Rosuvastatin.

## 2019-05-29 NOTE — Telephone Encounter (Signed)
That is good.  Continue current dose.  Thank you! Richardson Dopp, PA-C    05/29/2019 7:51 PM

## 2019-05-29 NOTE — Telephone Encounter (Signed)
I spoke with pt who is now home. He checked his bottle and he is taking Metoprolol Succinate 50 mg daily

## 2019-05-29 NOTE — Telephone Encounter (Signed)
Please have him call back with confirmed dose of Metoprolol.  Liliane Shi, PA-C 05/29/2019 15:46

## 2019-05-29 NOTE — Telephone Encounter (Signed)
I spoke with Danny Brewer who reports a stress test was discussed at recent office visit. He states he would like to schedule this.  Office note reviewed and Danny Brewer was instructed to start Pepcid twice daily for 2 weeks. Lexiscan to be done if symptoms continue after taking Pepcid.   Danny Brewer has not started Pepcid.  He is out now and will pick this up. He is having same symptoms this morning as he discussed at recent office visit. He feels they are worse today.  Also had this indigestion type feeling over the weekend and took TUMS which may have helped a little. I advised him to start Pepcid as advised.  He does not have list of medications with him and will call later to provide list of current medications.

## 2019-05-29 NOTE — Telephone Encounter (Signed)
New Message  Patient is calling in to get an order for a stress test put in. Patient states that he spoke with Richardson Dopp PA about it, and has decided to go ahead and have it done. Please assist with putting order in for stress test.

## 2019-05-30 NOTE — Telephone Encounter (Signed)
I spoke with patient and informed him that he is taking the correct dose of metoprolol and should continue to do so. Patient verbalized understanding.

## 2019-05-31 MED ORDER — METOPROLOL SUCCINATE ER 50 MG PO TB24: 50.0000 mg | ORAL_TABLET | Freq: Every day | ORAL | 3 refills | Status: DC

## 2019-06-22 ENCOUNTER — Other Ambulatory Visit: Payer: BC Managed Care – PPO

## 2019-07-08 ENCOUNTER — Other Ambulatory Visit: Payer: Self-pay | Admitting: Orthopedic Surgery

## 2019-07-08 DIAGNOSIS — S4990XA Unspecified injury of shoulder and upper arm, unspecified arm, initial encounter: Secondary | ICD-10-CM

## 2019-07-14 ENCOUNTER — Ambulatory Visit
Admission: RE | Admit: 2019-07-14 | Discharge: 2019-07-14 | Disposition: A | Discharge status: Home / Self Care | Payer: BC Managed Care – PPO | Source: Ambulatory Visit | Attending: Orthopedic Surgery | Admitting: Orthopedic Surgery

## 2019-07-14 ENCOUNTER — Other Ambulatory Visit: Payer: Self-pay

## 2019-07-14 DIAGNOSIS — S4990XA Unspecified injury of shoulder and upper arm, unspecified arm, initial encounter: Secondary | ICD-10-CM

## 2019-07-18 ENCOUNTER — Other Ambulatory Visit: Payer: Self-pay | Admitting: Family Medicine

## 2019-07-18 ENCOUNTER — Ambulatory Visit
Admission: RE | Admit: 2019-07-18 | Discharge: 2019-07-18 | Disposition: A | Discharge status: Home / Self Care | Payer: BC Managed Care – PPO | Source: Ambulatory Visit | Attending: Family Medicine | Admitting: Family Medicine

## 2019-07-18 DIAGNOSIS — R062 Wheezing: Secondary | ICD-10-CM

## 2019-07-26 ENCOUNTER — Telehealth: Payer: Self-pay | Admitting: *Deleted

## 2019-07-26 NOTE — Telephone Encounter (Signed)
   [facility] Pre-operative Risk Assessment    Request for surgical clearance:  1. What type of surgery is being performed? LEFT SHOULDER ARTHROSCOPY, SUBACROMIAL DECOMPRESSION, DISTAL CLAVICAL REDUCTION, MANIPULATION    2. When is this surgery scheduled? TBD   3. What type of clearance is required (medical clearance vs. Pharmacy clearance to hold med vs. Both)? MEDICAL  4. Are there any medications that need to be held prior to surgery and how long? NEED RECOMMENDATIONS FROM CARDIOLOGIST HOW LONG TO HOLD ASA  5. Practice name and name of physician performing surgery? [facility]; DR. Lara Mulch   6. What is your office phone number [phone]    7.   What is your office fax number [phone]  8.   Anesthesia type (None, local, MAC, general) ? CHOICE   Danny Brewer 07/26/2019, 12:45 PM  _________________________________________________________________   (provider comments below)

## 2019-07-27 NOTE — Telephone Encounter (Signed)
I will send clearance notes to Tereso Newcomer, [facility] for upcoming appt 08/02/19. Pt want's to discuss symptoms he has been having. See notes from Pre Op provider today. I will send to Dr. Sherlean Foot as Lorain Childes. I will remove from the pre op call back pool.

## 2019-07-27 NOTE — Telephone Encounter (Signed)
Patient is having some chest "twinges" and prefers to speak with Danny Brewer during his appt on 08/02/19 for clearance for shoulder surgery.   Scott, can you please send clearance after you see the patient?  I will remove from pool.

## 2019-08-01 NOTE — Progress Notes (Signed)
Cardiology Office Note:    Date:  08/02/2019   ID:  Danny Brewer, DOB 07-08-1970, MRN [id number]  PCP:  Vernie Shanks, MD  Cardiologist:  Sherren Mocha, MD   Electrophysiologist:  None   Referring MD: Vernie Shanks, MD   Chief Complaint:  Follow-up (CAD, surgical clearance)    Patient Profile:    Danny Brewer is a 49 y.o. male with:     Coronary artery disease  ? s/p CABG 10/2018 ? Echocardiogram 4/20: EF 60-65  Diabetes mellitus 2  Hypertension   Hyperlipidemia  Prior CV studies:   Cardiac Catheterization5/01/2019 LAD prox 95; D1 ost 80; D2 ost 80 RCA mid 85  Carotid US 10/15/2018 Bilat ICA 1-39  Echo 09/19/2018 EF 60-65  Echo 08/27/15 Mild conc LVH, EF 65-70, no RWMA, trivial MR, trivial TR  History of Present Illness:    Danny Brewer was last seen in clinic in 05/2019.  At that time, he was having chest pain that was not typical for angina.  I asked him to take Famotidine.  He returns for surgical clearance.  He needs L shoulder surgery with Dr. Ronnie Derby.  He is here alone today.  Since last seen, he notes improved chest symptoms. He mainly has some discomfort related to his incision from his surgery.  This has gradually improved since his CABG.  He has not had exertional chest pain or shortness of breath.  He has not had syncope, orthopnea, leg swelling.       Past Medical History:  Diagnosis Date  . Carotid US 10/15/2018   Carotid US 10/2018: bilateral ICA 1-39  . Complication of anesthesia    Pt verble and "prophecizes" after anesthesia  . Coronary Artery Disease    Coronary CTA 09/2018: pLAD 70-90; pD1+pD2 70-90; mRCA mixed plaque - cannot rule out 70-90; FFR suggests hemodynamically significant stenosis // LHC 10/2018: 2v CAD >> s/p  CABG (L-LAD, L radial-RI, S-D2, S-RCA) with Dr. Servando Snare   . Diabetes mellitus   . Echocardiogram 09/2018    Echo 09/2018: EF 60-65  . GERD   . Hyperlipidemia   . Hypertension   . Inguinal hernia 2016   right  .  Kidney stones   . Vision abnormalities     Current Medications: Current Meds  Medication Sig  . aspirin 81 MG tablet Take 1 tablet (81 mg total) by mouth daily.  . metoprolol succinate (TOPROL-XL) 50 MG 24 hr tablet Take 1 tablet (50 mg total) by mouth daily. Take with or immediately following a meal.  . sitaGLIPtin (JANUVIA) 100 MG tablet Take 100 mg by mouth daily.  . tamsulosin (FLOMAX) 0.4 MG CAPS capsule Take 0.4 mg by mouth daily.     Allergies:   Antihistamines, diphenhydramine-type; Demerol; and Morphine and related   Social History   Tobacco Use  . Smoking status: Never Smoker  . Smokeless tobacco: Never Used  Substance Use Topics  . Alcohol use: No  . Drug use: No     Family Hx: The patient's family history includes Cancer in his father; Heart attack in his brother; Heart failure in his father; Heart murmur in his sister; Hypertension in his mother and sister; Sudden death in his brother.  ROS   EKGs/Labs/Other Test Reviewed:    EKG:  EKG is  ordered today.  The ekg ordered today demonstrates normal sinus rhythm, HR 62, normal axis, QTc 397, no ST-TW changes, no change from prior EKG  Recent Labs: 09/18/2018: TSH 3.900 10/18/2018: Magnesium 2.3  11/19/2018: ALT 32; BUN 21; Creatinine, Ser 1.49; Hemoglobin 11.1; Platelets 247; Potassium 3.8; Sodium 137   Recent Lipid Panel Lab Results  Component Value Date/Time   CHOL 150 11/09/2018 09:17 AM   TRIG 95 11/09/2018 09:17 AM   HDL 37 (L) 11/09/2018 09:17 AM   CHOLHDL 4.1 11/09/2018 09:17 AM   CHOLHDL 4.3 09/19/2018 02:43 AM   LDLCALC 94 11/09/2018 09:17 AM    Physical Exam:    VS:  BP 132/82   Pulse 89   Ht 5\' 9"  (1.753 m)   Wt 201 lb 3.2 oz (91.3 kg)   SpO2 98%   BMI 29.71 kg/m     Wt Readings from Last 3 Encounters:  08/02/19 201 lb 3.2 oz (91.3 kg)  05/26/19 200 lb (90.7 kg)  04/25/19 198 lb (89.8 kg)     Constitutional:      Appearance: Healthy appearance. Not in distress.  Neck:     Thyroid:  Thyroid normal.     Vascular: JVD normal.  Pulmonary:     Effort: Pulmonary effort is normal.     Breath sounds: No wheezing. No rales.  Cardiovascular:     Normal rate. Regular rhythm. Normal S1. Normal S2.     Murmurs: There is no murmur.  Edema:    Peripheral edema absent.  Abdominal:     Palpations: Abdomen is soft. There is no hepatomegaly.  Skin:    General: Skin is warm and dry.  Neurological:     General: No focal deficit present.     Mental Status: Alert and oriented to person, place and time.      ASSESSMENT & PLAN:    1. Preoperative cardiovascular examination He is overall stable without symptoms of angina.  His risk of perioperative major cardiac event is low at 0.9% according to the Revised Cardiac Risk Index (RCRI).  His functional capacity is good at 5.07 METs according to the Duke Activity Status Index (DASI).  Therefore, according to ACC/AHA guidelines, he does not require further CV testing.  He may proceed at acceptable risk.  Ideally, he should remain on ASA throughout the perioperative period to reduce the risk of perioperative complications.  However, if the risk of bleeding is too great, the ASA should be resumed as soon as possible post op.    2. Coronary artery disease involving native coronary artery of native heart without angina pectoris Hx of CABG in 10/2018.  He is doing well without angina.  Continue ASA, beta-blocker.  He is intol of statins.  Continue Ezetimibe.    3. Essential hypertension Borderline control.  I have recommended increased activity and weight loss.  If his BP does not reach target (< 130/80) consider addition of ACE inhibitor/angiotensin receptor blocker or Amlodipine.    4. Pure hypercholesterolemia Continue Ezetimibe.  Arrange fasting Lipids and LFTs.  If LDL above goal (goal < 70), refer to Lipid Clinic for consideration of PCSK9 inhibitors.     Dispo:  Return in about 6 months (around 01/30/2020) for Routine Follow Up, w/ Dr.  02/01/2020, or Excell Seltzer, PA-C, in person.   Medication Adjustments/Labs and Tests Ordered: Current medicines are reviewed at length with the patient today.  Concerns regarding medicines are outlined above.  Tests Ordered: Orders Placed This Encounter  Procedures  . Lipid panel  . Hepatic function panel  . EKG 12-Lead   Medication Changes: No orders of the defined types were placed in this encounter.   Signed, Tereso Newcomer, PA-C  08/02/2019  3:11 PM    [facility] [address] [place], [place], [place]  [address] Phone: [phone]; Fax: [phone]

## 2019-08-02 ENCOUNTER — Encounter: Payer: Self-pay | Admitting: Physician Assistant

## 2019-08-02 ENCOUNTER — Ambulatory Visit (INDEPENDENT_AMBULATORY_CARE_PROVIDER_SITE_OTHER): Payer: BC Managed Care – PPO | Admitting: Physician Assistant

## 2019-08-02 ENCOUNTER — Other Ambulatory Visit: Payer: Self-pay

## 2019-08-02 VITALS — BP 132/82 | HR 89 | Ht 69.0 in | Wt 201.2 lb

## 2019-08-02 DIAGNOSIS — Z01810 Encounter for preprocedural cardiovascular examination: Secondary | ICD-10-CM

## 2019-08-02 DIAGNOSIS — I10 Essential (primary) hypertension: Secondary | ICD-10-CM | POA: Diagnosis not present

## 2019-08-02 DIAGNOSIS — E7800 Pure hypercholesterolemia, unspecified: Secondary | ICD-10-CM

## 2019-08-02 DIAGNOSIS — I2510 Atherosclerotic heart disease of native coronary artery without angina pectoris: Secondary | ICD-10-CM

## 2019-08-02 NOTE — Patient Instructions (Signed)
Medication Instructions:  Your physician recommends that you continue on your current medications as directed. Please refer to the Current Medication list given to you today.  *If you need a refill on your cardiac medications before your next appointment, please call your pharmacy*  Lab Work:  Your physician recommends that you return for lab work on 09/01/19, the lab opens at 7:30AM. Make sure you are fasting.  If you have labs (blood work) drawn today and your tests are completely normal, you will receive your results only by: Marland Kitchen MyChart Message (if you have MyChart) OR . A paper copy in the mail If you have any lab test that is abnormal or we need to change your treatment, we will call you to review the results.  Testing/Procedures:  None ordered today  Follow-Up: At [facility], you and your health needs are our priority.  As part of our continuing mission to provide you with exceptional heart care, we have created designated Provider Care Teams.  These Care Teams include your primary Cardiologist (physician) and Advanced Practice Providers (APPs -  Physician Assistants and Nurse Practitioners) who all work together to provide you with the care you need, when you need it.  Your next appointment:   6 month(s)  The format for your next appointment:   In Person  Provider:   You may see Tonny Bollman, MD or one of the following Advanced Practice Providers on your designated Care Team:    Tereso Newcomer, PA-C  [place], [place]  [place], [place]

## 2019-08-21 ENCOUNTER — Other Ambulatory Visit: Payer: Self-pay | Admitting: Student

## 2019-08-25 ENCOUNTER — Other Ambulatory Visit: Payer: Self-pay | Admitting: Orthopedic Surgery

## 2019-08-29 NOTE — Progress Notes (Signed)
DUE TO COVID-19 ONLY ONE VISITOR IS ALLOWED TO COME WITH YOU AND STAY IN THE WAITING ROOM ONLY DURING PRE OP AND PROCEDURE DAY OF SURGERY. THE 1 VISITOR MAY VISIT WITH YOU AFTER SURGERY IN YOUR PRIVATE ROOM DURING VISITING HOURS ONLY!  YOU NEED TO HAVE A COVID 19 TEST ON___3/26/21____ @___0940____ , THIS TEST MUST BE DONE BEFORE SURGERY, COME  [address], [place] , .  [facility]) ONCE YOUR COVID TEST IS COMPLETED, PLEASE BEGIN THE QUARANTINE INSTRUCTIONS AS OUTLINED IN YOUR HANDOUT.                Danny Brewer  08/29/2019   Your procedure is scheduled on:  09/04/19  Report to [facility] Main  Entrance   Report to admitting at    1215pm     Call this number if you have problems the morning of surgery [phone]    Remember: Do not eat food   :After Midnight. BRUSH YOUR TEETH MORNING OF SURGERY AND RINSE YOUR MOUTH OUT, NO CHEWING GUM CANDY OR MINTS.     Take these medicines the morning of surgery with A SIP OF WATER:  Toprol, Flomax, zetia  DO NOT TAKE ANY DIABETIC MEDICATIONS DAY OF YOUR SURGERY                               You may not have any metal on your body including hair pins and              piercings  Do not wear jewelry, make-up, lotions, powders or perfumes, deodorant                           Men may shave face and neck.   Do not bring valuables to the hospital. [facility] IS NOT             RESPONSIBLE   FOR VALUABLES.  Contacts, dentures or bridgework may not be worn into surgery.  Leave suitcase in the car. After surgery it may be brought to your room.     Patients discharged the day of surgery will not be allowed to drive home. IF YOU ARE HAVING SURGERY AND GOING HOME THE SAME DAY, YOU MUST HAVE AN ADULT TO DRIVE YOU HOME AND BE WITH YOU FOR 24 HOURS. YOU MAY GO HOME BY TAXI OR UBER OR ORTHERWISE, BUT AN ADULT MUST ACCOMPANY YOU HOME AND STAY WITH YOU FOR 24 HOURS.  Name and phone number of your driver:        Please read over the following fact sheets you were given: _____________________________________________________________________             NO SOLID FOOD AFTER MIDNIGHT THE NIGHT PRIOR TO SURGERY. NOTHING BY MOUTH EXCEPT CLEAR LIQUIDS UNTIL . PLEASE FINISH ENSURE DRINK PER SURGEON ORDER  WHICH NEEDS TO BE COMPLETED AT .   CLEAR LIQUID DIET   Foods Allowed                                                                     Foods Excluded  Coffee and tea, regular and decaf  liquids that you cannot  Plain Jell-O any favor except red or purple                                           see through such as: Fruit ices (not with fruit pulp)                                     milk, soups, orange juice  Iced Popsicles                                    All solid food Carbonated beverages, regular and diet                                    Cranberry, grape and apple juices Sports drinks like Gatorade Lightly seasoned clear broth or consume(fat free) Sugar, honey syrup                                                                                                                                                                                                    _____________________________________________________________________  [facility] - Preparing for Surgery Before surgery, you can play an important role.  Because skin is not sterile, your skin needs to be as free of germs as possible.  You can reduce the number of germs on your skin by washing with CHG (chlorahexidine gluconate) soap before surgery.  CHG is an antiseptic cleaner which kills germs and bonds with the skin to continue killing germs even after washing. Please DO NOT use if you have an allergy to CHG or antibacterial soaps.  If your skin becomes reddened/irritated stop using the CHG and inform your nurse when you arrive at Short Stay. Do not shave (including legs and underarms) for at least  48 hours prior to the first CHG shower.  You may shave your face/neck. Please follow these instructions carefully:  1.  Shower with CHG Soap the night before surgery and the  morning of Surgery.  2.  If you choose to wash your hair, wash your hair first as usual with your  normal  shampoo.  3.  After you shampoo, rinse your hair and body thoroughly to remove the  shampoo.  4.  Use CHG as you would any other liquid soap.  You can apply chg directly  to the skin and wash                       Gently with a scrungie or clean washcloth.  5.  Apply the CHG Soap to your body ONLY FROM THE NECK DOWN.   Do not use on face/ open                           Wound or open sores. Avoid contact with eyes, ears mouth and genitals (private parts).                       Wash face,  Genitals (private parts) with your normal soap.             6.  Wash thoroughly, paying special attention to the area where your surgery  will be performed.  7.  Thoroughly rinse your body with warm water from the neck down.  8.  DO NOT shower/wash with your normal soap after using and rinsing off  the CHG Soap.                9.  Pat yourself dry with a clean towel.            10.  Wear clean pajamas.            11.  Place clean sheets on your bed the night of your first shower and do not  sleep with pets. Day of Surgery : Do not apply any lotions/deodorants the morning of surgery.  Please wear clean clothes to the hospital/surgery center.  FAILURE TO FOLLOW THESE INSTRUCTIONS MAY RESULT IN THE CANCELLATION OF YOUR SURGERY PATIENT SIGNATURE_________________________________  NURSE SIGNATURE__________________________________  ________________________________________________________________________   Danny Brewer  An incentive spirometer is a tool that can help keep your lungs clear and active. This tool measures how well you are filling your lungs with each breath. Taking long deep breaths may  help reverse or decrease the chance of developing breathing (pulmonary) problems (especially infection) following:  A long period of time when you are unable to move or be active. BEFORE THE PROCEDURE   If the spirometer includes an indicator to show your best effort, your nurse or respiratory therapist will set it to a desired goal.  If possible, sit up straight or lean slightly forward. Try not to slouch.  Hold the incentive spirometer in an upright position. INSTRUCTIONS FOR USE  1. Sit on the edge of your bed if possible, or sit up as far as you can in bed or on a chair. 2. Hold the incentive spirometer in an upright position. 3. Breathe out normally. 4. Place the mouthpiece in your mouth and seal your lips tightly around it. 5. Breathe in slowly and as deeply as possible, raising the piston or the ball toward the top of the column. 6. Hold your breath for 3-5 seconds or for as long as possible. Allow the piston or ball to fall to the bottom of the column. 7. Remove the mouthpiece from your mouth and breathe out normally. 8. Rest for a few seconds and repeat Steps 1 through 7 at least 10 times every 1-2 hours when you are awake. Take your time and take a few normal breaths between deep breaths. 9. The spirometer may include an indicator to show  your best effort. Use the indicator as a goal to work toward during each repetition. 10. After each set of 10 deep breaths, practice coughing to be sure your lungs are clear. If you have an incision (the cut made at the time of surgery), support your incision when coughing by placing a pillow or rolled up towels firmly against it. Once you are able to get out of bed, walk around indoors and cough well. You may stop using the incentive spirometer when instructed by your caregiver.  RISKS AND COMPLICATIONS  Take your time so you do not get dizzy or light-headed.  If you are in pain, you may need to take or ask for pain medication before doing  incentive spirometry. It is harder to take a deep breath if you are having pain. AFTER USE  Rest and breathe slowly and easily.  It can be helpful to keep track of a log of your progress. Your caregiver can provide you with a simple table to help with this. If you are using the spirometer at home, follow these instructions: SEEK MEDICAL CARE IF:   You are having difficultly using the spirometer.  You have trouble using the spirometer as often as instructed.  Your pain medication is not giving enough relief while using the spirometer.  You develop fever of 100.5 F (38.1 C) or higher. SEEK IMMEDIATE MEDICAL CARE IF:   You cough up bloody sputum that had not been present before.  You develop fever of 102 F (38.9 C) or greater.  You develop worsening pain at or near the incision site. MAKE SURE YOU:   Understand these instructions.  Will watch your condition.  Will get help right away if you are not doing well or get worse. Document Released: 10/05/2006 Document Revised: 08/17/2011 Document Reviewed: 12/06/2006 [facility] Patient Information 2014 [place], [place].   ________________________________________________________________________

## 2019-08-30 ENCOUNTER — Encounter (HOSPITAL_COMMUNITY)
Admission: RE | Admit: 2019-08-30 | Discharge: 2019-08-30 | Disposition: A | Discharge status: Home / Self Care | Payer: BC Managed Care – PPO | Source: Ambulatory Visit | Attending: Orthopedic Surgery | Admitting: Orthopedic Surgery

## 2019-08-30 ENCOUNTER — Other Ambulatory Visit: Payer: Self-pay

## 2019-08-30 ENCOUNTER — Encounter (HOSPITAL_COMMUNITY): Payer: Self-pay

## 2019-08-30 DIAGNOSIS — Z01812 Encounter for preprocedural laboratory examination: Secondary | ICD-10-CM | POA: Diagnosis not present

## 2019-08-30 HISTORY — DX: Pneumonia, unspecified organism: J18.9

## 2019-08-30 HISTORY — DX: Personal history of urinary calculi: Z87.442

## 2019-08-30 HISTORY — DX: Unspecified osteoarthritis, unspecified site: M19.90

## 2019-08-30 LAB — CBC
HCT: 44.2 % (ref 39.0–52.0)
Hemoglobin: 14 g/dL (ref 13.0–17.0)
MCH: 28.5 pg (ref 26.0–34.0)
MCHC: 31.7 g/dL (ref 30.0–36.0)
MCV: 89.8 fL (ref 80.0–100.0)
Platelets: 202 10*3/uL (ref 150–400)
RBC: 4.92 MIL/uL (ref 4.22–5.81)
RDW: 12.4 % (ref 11.5–15.5)
WBC: 6 10*3/uL (ref 4.0–10.5)
nRBC: 0 % (ref 0.0–0.2)

## 2019-08-30 LAB — BASIC METABOLIC PANEL
Anion gap: 5 (ref 5–15)
BUN: 17 mg/dL (ref 6–20)
CO2: 26 mmol/L (ref 22–32)
Calcium: 8.7 mg/dL — ABNORMAL LOW (ref 8.9–10.3)
Chloride: 106 mmol/L (ref 98–111)
Creatinine, Ser: 1.35 mg/dL — ABNORMAL HIGH (ref 0.61–1.24)
GFR calc Af Amer: >60 mL/min (ref >60)
GFR calc non Af Amer: >60 mL/min (ref >60)
Glucose, Bld: 126 mg/dL — ABNORMAL HIGH (ref 70–99)
Potassium: 3.8 mmol/L (ref 3.5–5.1)
Sodium: 137 mmol/L (ref 135–145)

## 2019-08-30 LAB — HEMOGLOBIN A1C
Hgb A1c MFr Bld: 6.4 % — ABNORMAL HIGH (ref 4.8–5.6)
Mean Plasma Glucose: 136.98 mg/dL

## 2019-08-30 LAB — GLUCOSE, CAPILLARY: Glucose-Capillary: 115 mg/dL — ABNORMAL HIGH (ref 70–99)

## 2019-08-30 NOTE — Patient Instructions (Signed)
DUE TO COVID-19 ONLY ONE VISITOR IS ALLOWED TO COME WITH YOU AND STAY IN THE WAITING ROOM ONLY DURING PRE OP AND PROCEDURE DAY OF SURGERY. THE 1 VISITOR MAY VISIT WITH YOU AFTER SURGERY IN YOUR PRIVATE ROOM DURING VISITING HOURS ONLY!  10 am- 8 pm  YOU NEED TO HAVE A COVID 19 TEST ON___3/26/21____ @___0940____ , THIS TEST MUST BE DONE BEFORE SURGERY, COME  [address], [place] , .  [facility]) ONCE YOUR COVID TEST IS COMPLETED, PLEASE BEGIN THE QUARANTINE INSTRUCTIONS AS OUTLINED IN YOUR HANDOUT.                Reiss Mowrey  08/30/2019   Your procedure is scheduled on:  09/04/19   Report to [facility] Main  Entrance   Report to admitting at    1215pm     Call this number if you have problems the morning of surgery [phone]    Remember: Do not eat food   :After Midnight. BRUSH YOUR TEETH MORNING OF SURGERY AND RINSE YOUR MOUTH OUT, NO CHEWING GUM CANDY OR MINTS.     Take these medicines the morning of surgery with A SIP OF WATER: zetia    DO NOT TAKE ANY DIABETIC MEDICATIONS DAY OF YOUR SURGERY                               You may not have any metal on your body including hair pins and              piercings  Do not wear jewelry,, lotions, powders or perfumes, deodorant                           Men may shave face and neck.   Do not bring valuables to the hospital. [facility] IS NOT             RESPONSIBLE   FOR VALUABLES.  Contacts, dentures or bridgework may not be worn into surgery.      Patients discharged the day of surgery will not be allowed to drive home. IF YOU ARE HAVING SURGERY AND GOING HOME THE SAME DAY, YOU MUST HAVE AN ADULT TO DRIVE YOU HOME AND BE WITH YOU FOR 24 HOURS. YOU MAY GO HOME BY TAXI OR UBER OR ORTHERWISE, BUT AN ADULT MUST ACCOMPANY YOU HOME AND STAY WITH YOU FOR 24 HOURS.  Name and phone number of your driver:                Please read over the following fact sheets you were  given: _____________________________________________________________________             NO SOLID FOOD AFTER MIDNIGHT THE NIGHT PRIOR TO SURGERY. NOTHING BY MOUTH EXCEPT CLEAR LIQUIDS UNTIL    1115 am  . PLEASE FINISH  G2  DRINK PER SURGEON ORDER  WHICH NEEDS TO BE COMPLETED AT     1115 am then nothing by mouth.   CLEAR LIQUID DIET   Foods Allowed  Foods Excluded  Coffee and tea, regular and decaf   No creamer                                           liquids that you cannot  Plain Jell-O any favor except red or purple                                           see through such as: Fruit ices (not with fruit pulp)                                                                milk, soups, orange juice  Iced            All solid food Carbonated beverages, regular and diet                                    Cranberry, grape and apple juices Sports drinks like Gatorade Lightly seasoned clear broth or consume(fat free) Sugar, honey syrup   [facility] - Preparing for Surgery Before surgery, you can play an important role.  Because skin is not sterile, your skin needs to be as free of germs as possible.  You can reduce the number of germs on your skin by washing with CHG (chlorahexidine gluconate) soap before surgery.  CHG is an antiseptic cleaner which kills germs and bonds with the skin to continue killing germs even after washing. Please DO NOT use if you have an allergy to CHG or antibacterial soaps.  If your skin becomes reddened/irritated stop using the CHG and inform your nurse when you arrive at Short Stay. Do not shave (including legs and underarms) for at least 48 hours prior to the first CHG shower.  You may shave your face/neck. Please follow these instructions carefully:  1.  Shower with CHG Soap the night before surgery and the  morning of Surgery.  2.  If you choose to wash your hair, wash  your hair first as usual with your  normal  shampoo.  3.  After you shampoo, rinse your hair and body thoroughly to remove the  shampoo.                           4.  Use CHG as you would any other liquid soap.  You can apply chg directly  to the skin and wash                       Gently with a scrungie or clean washcloth.  5.  Apply the CHG Soap to your body ONLY FROM THE NECK DOWN.   Do not use on face/ open                           Wound or open sores. Avoid contact with eyes, ears mouth and genitals (private parts).  Wash face,  Genitals (private parts) with your normal soap.             6.  Wash thoroughly, paying special attention to the area where your surgery  will be performed.  7.  Thoroughly rinse your body with warm water from the neck down.  8.  DO NOT shower/wash with your normal soap after using and rinsing off  the CHG Soap.                9.  Pat yourself dry with a clean towel.            10.  Wear clean pajamas.            11.  Place clean sheets on your bed the night of your first shower and do not  sleep with pets. Day of Surgery : Do not apply any lotions/deodorants the morning of surgery.  Please wear clean clothes to the hospital/surgery center.  FAILURE TO FOLLOW THESE INSTRUCTIONS MAY RESULT IN THE CANCELLATION OF YOUR SURGERY PATIENT SIGNATURE_________________________________  NURSE SIGNATURE__________________________________  ________________________________________________________________________   Adam Phenix  An incentive spirometer is a tool that can help keep your lungs clear and active. This tool measures how well you are filling your lungs with each breath. Taking long deep breaths may help reverse or decrease the chance of developing breathing (pulmonary) problems (especially infection) following:  A long period of time when you are unable to move or be active. BEFORE THE PROCEDURE   If the spirometer includes an  indicator to show your best effort, your nurse or respiratory therapist will set it to a desired goal.  If possible, sit up straight or lean slightly forward. Try not to slouch.  Hold the incentive spirometer in an upright position. INSTRUCTIONS FOR USE  1. Sit on the edge of your bed if possible, or sit up as far as you can in bed or on a chair. 2. Hold the incentive spirometer in an upright position. 3. Breathe out normally. 4. Place the mouthpiece in your mouth and seal your lips tightly around it. 5. Breathe in slowly and as deeply as possible, raising the piston or the ball toward the top of the column. 6. Hold your breath for 3-5 seconds or for as long as possible. Allow the piston or ball to fall to the bottom of the column. 7. Remove the mouthpiece from your mouth and breathe out normally. 8. Rest for a few seconds and repeat Steps 1 through 7 at least 10 times every 1-2 hours when you are awake. Take your time and take a few normal breaths between deep breaths. 9. The spirometer may include an indicator to show your best effort. Use the indicator as a goal to work toward during each repetition. 10. After each set of 10 deep breaths, practice coughing to be sure your lungs are clear. If you have an incision (the cut made at the time of surgery), support your incision when coughing by placing a pillow or rolled up towels firmly against it. Once you are able to get out of bed, walk around indoors and cough well. You may stop using the incentive spirometer when instructed by your caregiver.  RISKS AND COMPLICATIONS  Take your time so you do not get dizzy or light-headed.  If you are in pain, you may need to take or ask for pain medication before doing incentive spirometry. It is harder to take a deep breath if you are having pain.  AFTER USE  Rest and breathe slowly and easily.  It can be helpful to keep track of a log of your progress. Your caregiver can provide you with a simple table  to help with this. If you are using the spirometer at home, follow these instructions: [place] IF:   You are having difficultly using the spirometer.  You have trouble using the spirometer as often as instructed.  Your pain medication is not giving enough relief while using the spirometer.  You develop fever of 100.5 F (38.1 C) or higher. SEEK IMMEDIATE MEDICAL CARE IF:   You cough up bloody sputum that had not been present before.  You develop fever of 102 F (38.9 C) or greater.  You develop worsening pain at or near the incision site. MAKE SURE YOU:   Understand these instructions.  Will watch your condition.  Will get help right away if you are not doing well or get worse. Document Released: 10/05/2006 Document Revised: 08/17/2011 Document Reviewed: 12/06/2006 [facility] Patient Information 2014 [place], [place].   ________________________________________________________________________

## 2019-08-30 NOTE — Progress Notes (Signed)
PCP - Leodis Sias Cardiologist - Tereso Newcomer  lov 08-02-19 epic  With clearance  Chest x-ray - 07-18-19 epic EKG - 08-02-19 epic Stress Test -  ECHO - 10-17-18 epic Cardiac Cath - 10-14-18 epic  Sleep Study -  CPAP -   Fasting Blood Sugar -  Checks Blood Sugar _____ times a day  Blood Thinner Instructions: Aspirin Instructions: 81mg  stopping Saturday before surgery Last Dose:  Anesthesia review: CABG 10-17-18   Patient denies shortness of breath, fever, cough and chest pain at PAT appointment   none   Patient verbalized understanding of instructions that were given to them at the PAT appointment. Patient was also instructed that they will need to review over the PAT instructions again at home before surgery.

## 2019-08-31 NOTE — Progress Notes (Signed)
Anesthesia Chart Review   Case: [id number] Date/Time: 09/04/19 1400   Procedures:      SHOULDER ARTHROSCOPY WITH SUBACROMIAL DECOMPRESSION AND DISTAL CLAVICLE EXCISION (Left )     CLOSED MANIPULATION SHOULDER (Left Shoulder)   Anesthesia type: Choice   Pre-op diagnosis: Osteoarthitis left shoulder, impingement left shoulder, adhesive capsulitis left shoulder   [facility] ROOM 05 / [facility]   Surgeons: Vickey Huger, MD      DISCUSSION:49 y.o. never smoker with h/o GERD, HTN, HLD, DM II, CAD (s/p CABG 10/2018), left shoulder OA, impingment, and adhesive capsulitis scheduled for above procedure 09/04/19 with Dr. Vickey Huger.   Pt last seen by cardiology 08/02/19.  Per OV note, "He is overall stable without symptoms of angina.  His risk of perioperative major cardiac event is low at 0.9% according to the Revised Cardiac Risk Index (RCRI).  His functional capacity is good at 5.07 METs according to the Duke Activity Status Index (DASI).  Therefore, according to ACC/AHA guidelines, he does not require further CV testing.  He may proceed at acceptable risk.  Ideally, he should remain on ASA throughout the perioperative period to reduce the risk of perioperative complications.  However, if the risk of bleeding is too great, the ASA should be resumed as soon as possible post op."  Anticipate pt can proceed with planned procedure barring acute status change.   VS: BP 135/89 (BP Location: Left Arm)   Pulse 81   Temp 36.7 C (Oral)   Resp 17   Ht 5\' 9"  (1.753 m)   Wt 90.7 kg   SpO2 100%   BMI 29.53 kg/m   PROVIDERS: Vernie Shanks, MD is PCP   Sherren Mocha, MD is Cardiologist  LABS: Labs reviewed: Acceptable for surgery. (all labs ordered are listed, but only abnormal results are displayed)  Labs Reviewed  GLUCOSE, CAPILLARY - Abnormal; Notable for the following components:      Result Value   Glucose-Capillary 115 (*)    All other components within normal limits      IMAGES:   EKG: 08/02/2019 Rate 62 bpm NSR  CV: Echo 09/19/2018 IMPRESSIONS    1. The left ventricle has normal systolic function, with an ejection  fraction of 60-65%. The cavity size was normal. Left ventricular diastolic  function could not be evaluated.  2. The right ventricle has normal systolc function. The cavity was  normal. There is no increase in right ventricular wall thickness.  3. Aortic valve regurgitation was not assessed by color flow Doppler.  Past Medical History:  Diagnosis Date  . Arthritis   . Carotid US 10/15/2018   Carotid US 10/2018: bilateral ICA 1-39  . Complication of anesthesia    Pt verble and "prophecizes" after anesthesia  . Coronary Artery Disease    Coronary CTA 09/2018: pLAD 70-90; pD1+pD2 70-90; mRCA mixed plaque - cannot rule out 70-90; FFR suggests hemodynamically significant stenosis // LHC 10/2018: 2v CAD >> s/p  CABG (L-LAD, L radial-RI, S-D2, S-RCA) with Dr. Servando Snare   . Diabetes mellitus    type 2  . Echocardiogram 09/2018    Echo 09/2018: EF 60-65  . GERD   . History of kidney stones   . Hyperlipidemia   . Hypertension   . Inguinal hernia 2016   right  . Pneumonia   . Vision abnormalities     Past Surgical History:  Procedure Laterality Date  . CORONARY ARTERY BYPASS GRAFT N/A 10/17/2018   Procedure: CORONARY ARTERY BYPASS GRAFTING (CABG),  ON PUMP, TIMES FOUR, USING LIMA TO LAD, ENDOSCOPICALLY HARVESTED GREATER SAPHENOUS VEIN TO DIAG 2, SVG TO DISTAL RCA, AND LEFT RADIAL TO RAMUS INTERMEDIUS;  Surgeon: [name], MD;  Location: [facility] OR;  Service: Open Heart Surgery;  Laterality: N/A;  . ENDOVEIN HARVEST OF GREATER SAPHENOUS VEIN Right 10/17/2018   Procedure: Mack Guise Of Greater Saphenous Vein;  Surgeon: [name], MD;  Location: [facility] OR;  Service: Open Heart Surgery;  Laterality: Right;  . HAND SURGERY Left 1987  . HERNIA REPAIR    . INGUINAL HERNIA REPAIR Bilateral 05/27/2015   Procedure:  LAPAROSCOPIC BILATERAL  INGUINAL HERNIA REPAIRS WITH MESH;  Surgeon: Karie Soda, MD;  Location[facility]S;  Service: General;  Laterality: Bilateral;  . KNEE SURGERY     left scope  . LEFT HEART CATH AND CORONARY ANGIOGRAPHY N/A 10/14/2018   Procedure: LEFT HEART CATH AND CORONARY ANGIOGRAPHY;  Surgeon: Lyn Records, MD;  Location: [facility] INVASIVE CV LAB;  Service: Cardiovascular;  Laterality: N/A;  . RADIAL ARTERY HARVEST Left 10/17/2018   Procedure: RADIAL ARTERY HARVEST;  Surgeon: [name], MD;  Location: [facility] OR;  Service: Open Heart Surgery;  Laterality: Left;  . TEE WITHOUT CARDIOVERSION N/A 10/17/2018   Procedure: TRANSESOPHAGEAL ECHOCARDIOGRAM (TEE);  Surgeon: [name], MD;  Location: [facility] OR;  Service: Open Heart Surgery;  Laterality: N/A;    MEDICATIONS: . acetaminophen (TYLENOL) 325 MG tablet  . amoxicillin-clavulanate (AUGMENTIN) 500-125 MG tablet  . aspirin 81 MG tablet  . ezetimibe (ZETIA) 10 MG tablet  . metoprolol succinate (TOPROL-XL) 50 MG 24 hr tablet  . sitaGLIPtin (JANUVIA) 100 MG tablet  . tamsulosin (FLOMAX) 0.4 MG CAPS capsule   No current facility-administered medications for this encounter.     Janey Genta [facility] Pre-Surgical Testing [phone] 08/31/19  12:53 PM

## 2019-09-01 ENCOUNTER — Other Ambulatory Visit: Payer: BC Managed Care – PPO | Admitting: *Deleted

## 2019-09-01 ENCOUNTER — Other Ambulatory Visit (HOSPITAL_COMMUNITY)
Admission: RE | Admit: 2019-09-01 | Discharge: 2019-09-01 | Disposition: A | Discharge status: Home / Self Care | Payer: BC Managed Care – PPO | Source: Ambulatory Visit | Attending: Orthopedic Surgery | Admitting: Orthopedic Surgery

## 2019-09-01 ENCOUNTER — Other Ambulatory Visit: Payer: Self-pay

## 2019-09-01 DIAGNOSIS — Z01812 Encounter for preprocedural laboratory examination: Secondary | ICD-10-CM | POA: Insufficient documentation

## 2019-09-01 DIAGNOSIS — E7800 Pure hypercholesterolemia, unspecified: Secondary | ICD-10-CM

## 2019-09-01 DIAGNOSIS — Z20822 Contact with and (suspected) exposure to covid-19: Secondary | ICD-10-CM | POA: Insufficient documentation

## 2019-09-01 DIAGNOSIS — I2510 Atherosclerotic heart disease of native coronary artery without angina pectoris: Secondary | ICD-10-CM

## 2019-09-01 LAB — LIPID PANEL
Chol/HDL Ratio: 4.4 ratio (ref 0.0–5.0)
Cholesterol, Total: 209 mg/dL — ABNORMAL HIGH (ref 100–199)
HDL: 47 mg/dL (ref >39)
LDL Chol Calc (NIH): 149 mg/dL — ABNORMAL HIGH (ref 0–99)
Triglycerides: 72 mg/dL (ref 0–149)
VLDL Cholesterol Cal: 13 mg/dL (ref 5–40)

## 2019-09-01 LAB — HEPATIC FUNCTION PANEL
ALT: 20 IU/L (ref 0–44)
AST: 20 IU/L (ref 0–40)
Albumin: 4 g/dL (ref 4.0–5.0)
Alkaline Phosphatase: 71 IU/L (ref 39–117)
Bilirubin Total: 0.2 mg/dL (ref 0.0–1.2)
Bilirubin, Direct: 0.07 mg/dL (ref 0.00–0.40)
Total Protein: 6.9 g/dL (ref 6.0–8.5)

## 2019-09-01 LAB — SARS CORONAVIRUS 2 (TAT 6-24 HRS): SARS Coronavirus 2: NEGATIVE

## 2019-09-01 NOTE — Progress Notes (Signed)
Reviewed preprocedure instructions with patient.  Patient called in to ask questions.  Patient aware to drink G2 drink at 1100-1115am morning of surgery .  May have clear liquids from 12 midnite until 1115am of surgery.  Reviewed clear liquid diet with patient.  Patient aware not to take diabetic meds am of surgery.  Patient made aware to take Toprol pm before as usually does per patient And patient aware to take Flomax nite before surgery per usual routine.

## 2019-09-04 ENCOUNTER — Ambulatory Visit (HOSPITAL_COMMUNITY): Payer: BC Managed Care – PPO | Admitting: Physician Assistant

## 2019-09-04 ENCOUNTER — Encounter (HOSPITAL_COMMUNITY): Payer: Self-pay | Admitting: Orthopedic Surgery

## 2019-09-04 ENCOUNTER — Encounter (HOSPITAL_COMMUNITY)
Admission: RE | Disposition: A | Discharge status: Home / Self Care | Payer: Self-pay | Source: Home / Self Care | Attending: Orthopedic Surgery

## 2019-09-04 ENCOUNTER — Telehealth (HOSPITAL_COMMUNITY): Payer: Self-pay | Admitting: *Deleted

## 2019-09-04 ENCOUNTER — Ambulatory Visit (HOSPITAL_COMMUNITY): Payer: BC Managed Care – PPO | Admitting: Anesthesiology

## 2019-09-04 ENCOUNTER — Other Ambulatory Visit: Payer: Self-pay

## 2019-09-04 ENCOUNTER — Ambulatory Visit (HOSPITAL_COMMUNITY)
Admission: RE | Admit: 2019-09-04 | Discharge: 2019-09-04 | Disposition: A | Discharge status: Home / Self Care | Payer: BC Managed Care – PPO | Attending: Orthopedic Surgery | Admitting: Orthopedic Surgery

## 2019-09-04 DIAGNOSIS — M19012 Primary osteoarthritis, left shoulder: Secondary | ICD-10-CM | POA: Diagnosis present

## 2019-09-04 DIAGNOSIS — E785 Hyperlipidemia, unspecified: Secondary | ICD-10-CM

## 2019-09-04 DIAGNOSIS — G4733 Obstructive sleep apnea (adult) (pediatric): Secondary | ICD-10-CM | POA: Diagnosis not present

## 2019-09-04 DIAGNOSIS — M7542 Impingement syndrome of left shoulder: Secondary | ICD-10-CM | POA: Insufficient documentation

## 2019-09-04 DIAGNOSIS — I10 Essential (primary) hypertension: Secondary | ICD-10-CM | POA: Diagnosis not present

## 2019-09-04 DIAGNOSIS — Z8249 Family history of ischemic heart disease and other diseases of the circulatory system: Secondary | ICD-10-CM | POA: Insufficient documentation

## 2019-09-04 DIAGNOSIS — Z7984 Long term (current) use of oral hypoglycemic drugs: Secondary | ICD-10-CM | POA: Insufficient documentation

## 2019-09-04 DIAGNOSIS — K219 Gastro-esophageal reflux disease without esophagitis: Secondary | ICD-10-CM | POA: Insufficient documentation

## 2019-09-04 DIAGNOSIS — Z951 Presence of aortocoronary bypass graft: Secondary | ICD-10-CM | POA: Insufficient documentation

## 2019-09-04 DIAGNOSIS — E119 Type 2 diabetes mellitus without complications: Secondary | ICD-10-CM | POA: Diagnosis not present

## 2019-09-04 DIAGNOSIS — Z79899 Other long term (current) drug therapy: Secondary | ICD-10-CM | POA: Diagnosis not present

## 2019-09-04 DIAGNOSIS — I2510 Atherosclerotic heart disease of native coronary artery without angina pectoris: Secondary | ICD-10-CM | POA: Diagnosis not present

## 2019-09-04 HISTORY — PX: SHOULDER CLOSED REDUCTION: SHX1051

## 2019-09-04 LAB — GLUCOSE, CAPILLARY
Glucose-Capillary: 81 mg/dL (ref 70–99)
Glucose-Capillary: 98 mg/dL (ref 70–99)

## 2019-09-04 SURGERY — SHOULDER ARTHROSCOPY WITH SUBACROMIAL DECOMPRESSION AND DISTAL CLAVICLE EXCISION
Anesthesia: General | Site: Shoulder | Laterality: Left

## 2019-09-04 MED ORDER — BUPIVACAINE-EPINEPHRINE (PF) 0.5% -1:200000 IJ SOLN
INTRAMUSCULAR | Status: AC
  Filled 2019-09-04: qty 30

## 2019-09-04 MED ORDER — ONDANSETRON HCL 4 MG/2ML IJ SOLN
INTRAMUSCULAR | Status: AC
  Filled 2019-09-04: qty 2

## 2019-09-04 MED ORDER — SODIUM CHLORIDE 0.9 % IR SOLN
Status: DC | PRN
  Administered 2019-09-04: 6000 mL
  Administered 2019-09-04: 1000 mL

## 2019-09-04 MED ORDER — PROPOFOL 10 MG/ML IV BOLUS
INTRAVENOUS | Status: DC | PRN
  Administered 2019-09-04: 200 mg via INTRAVENOUS

## 2019-09-04 MED ORDER — OXYCODONE HCL 5 MG/5ML PO SOLN: 5.0000 mg | Freq: Once | ORAL | Status: DC | PRN

## 2019-09-04 MED ORDER — ROCURONIUM BROMIDE 10 MG/ML (PF) SYRINGE
PREFILLED_SYRINGE | INTRAVENOUS | Status: AC
  Filled 2019-09-04: qty 10

## 2019-09-04 MED ORDER — ONDANSETRON HCL 4 MG/2ML IJ SOLN
INTRAMUSCULAR | Status: DC | PRN
  Administered 2019-09-04: 4 mg via INTRAVENOUS

## 2019-09-04 MED ORDER — FENTANYL CITRATE (PF) 100 MCG/2ML IJ SOLN
50.0000 ug | INTRAMUSCULAR | Status: DC
  Administered 2019-09-04: 100 ug via INTRAVENOUS
  Filled 2019-09-04: qty 2

## 2019-09-04 MED ORDER — DEXAMETHASONE SODIUM PHOSPHATE 10 MG/ML IJ SOLN
INTRAMUSCULAR | Status: AC
  Filled 2019-09-04: qty 1

## 2019-09-04 MED ORDER — PROPOFOL 10 MG/ML IV BOLUS
INTRAVENOUS | Status: AC
  Filled 2019-09-04: qty 20

## 2019-09-04 MED ORDER — SUGAMMADEX SODIUM 200 MG/2ML IV SOLN
INTRAVENOUS | Status: DC | PRN
  Administered 2019-09-04: 200 mg via INTRAVENOUS

## 2019-09-04 MED ORDER — PHENYLEPHRINE HCL-NACL 10-0.9 MG/250ML-% IV SOLN
INTRAVENOUS | Status: DC | PRN
  Administered 2019-09-04: 20 ug/min via INTRAVENOUS

## 2019-09-04 MED ORDER — PHENYLEPHRINE HCL (PRESSORS) 10 MG/ML IV SOLN
INTRAVENOUS | Status: AC
  Filled 2019-09-04: qty 1

## 2019-09-04 MED ORDER — LIDOCAINE 2% (20 MG/ML) 5 ML SYRINGE
INTRAMUSCULAR | Status: AC
  Filled 2019-09-04: qty 5

## 2019-09-04 MED ORDER — FENTANYL CITRATE (PF) 100 MCG/2ML IJ SOLN: 25.0000 ug | INTRAMUSCULAR | Status: DC | PRN

## 2019-09-04 MED ORDER — CHLORHEXIDINE GLUCONATE 4 % EX LIQD
60.0000 mL | Freq: Once | CUTANEOUS | Status: AC
  Administered 2019-09-04: 4 via TOPICAL

## 2019-09-04 MED ORDER — BUPIVACAINE-EPINEPHRINE (PF) 0.5% -1:200000 IJ SOLN
INTRAMUSCULAR | Status: DC | PRN
  Administered 2019-09-04: 15 mL

## 2019-09-04 MED ORDER — MIDAZOLAM HCL 2 MG/2ML IJ SOLN: 1.0000 mg | INTRAMUSCULAR | Status: DC

## 2019-09-04 MED ORDER — ACETAMINOPHEN 160 MG/5ML PO SOLN: 325.0000 mg | ORAL | Status: DC | PRN

## 2019-09-04 MED ORDER — MEPERIDINE HCL 50 MG/ML IJ SOLN: 6.2500 mg | INTRAMUSCULAR | Status: DC | PRN

## 2019-09-04 MED ORDER — OXYCODONE HCL 5 MG PO TABS: 5.0000 mg | ORAL_TABLET | Freq: Four times a day (QID) | ORAL | 0 refills | Status: DC | PRN

## 2019-09-04 MED ORDER — FENTANYL CITRATE (PF) 100 MCG/2ML IJ SOLN
INTRAMUSCULAR | Status: AC
  Filled 2019-09-04: qty 2

## 2019-09-04 MED ORDER — OXYCODONE HCL 5 MG PO TABS: 5.0000 mg | ORAL_TABLET | Freq: Once | ORAL | Status: DC | PRN

## 2019-09-04 MED ORDER — ONDANSETRON HCL 4 MG/2ML IJ SOLN: 4.0000 mg | Freq: Once | INTRAMUSCULAR | Status: DC | PRN

## 2019-09-04 MED ORDER — MORPHINE SULFATE (PF) 4 MG/ML IV SOLN
INTRAVENOUS | Status: AC
  Filled 2019-09-04: qty 1

## 2019-09-04 MED ORDER — LACTATED RINGERS IV SOLN: INTRAVENOUS | Status: DC

## 2019-09-04 MED ORDER — BUPIVACAINE LIPOSOME 1.3 % IJ SUSP
INTRAMUSCULAR | Status: DC | PRN
  Administered 2019-09-04: 10 mL via PERINEURAL

## 2019-09-04 MED ORDER — LIDOCAINE 2% (20 MG/ML) 5 ML SYRINGE
INTRAMUSCULAR | Status: DC | PRN
  Administered 2019-09-04: 80 mg via INTRAVENOUS

## 2019-09-04 MED ORDER — MIDAZOLAM HCL 2 MG/2ML IJ SOLN
1.0000 mg | INTRAMUSCULAR | Status: DC
  Administered 2019-09-04: 2 mg via INTRAVENOUS
  Filled 2019-09-04: qty 2

## 2019-09-04 MED ORDER — BUPIVACAINE HCL (PF) 0.5 % IJ SOLN
INTRAMUSCULAR | Status: AC
  Filled 2019-09-04: qty 30

## 2019-09-04 MED ORDER — FENTANYL CITRATE (PF) 100 MCG/2ML IJ SOLN: 50.0000 ug | INTRAMUSCULAR | Status: DC

## 2019-09-04 MED ORDER — DEXMEDETOMIDINE HCL IN NACL 200 MCG/50ML IV SOLN
INTRAVENOUS | Status: AC
  Filled 2019-09-04: qty 50

## 2019-09-04 MED ORDER — DEXAMETHASONE SODIUM PHOSPHATE 10 MG/ML IJ SOLN
INTRAMUSCULAR | Status: DC | PRN
  Administered 2019-09-04: 4 mg via INTRAVENOUS

## 2019-09-04 MED ORDER — FENTANYL CITRATE (PF) 250 MCG/5ML IJ SOLN
INTRAMUSCULAR | Status: DC | PRN
  Administered 2019-09-04 (×2): 50 ug via INTRAVENOUS

## 2019-09-04 MED ORDER — ROCURONIUM BROMIDE 10 MG/ML (PF) SYRINGE
PREFILLED_SYRINGE | INTRAVENOUS | Status: DC | PRN
  Administered 2019-09-04: 60 mg via INTRAVENOUS

## 2019-09-04 MED ORDER — PHENYLEPHRINE 40 MCG/ML (10ML) SYRINGE FOR IV PUSH (FOR BLOOD PRESSURE SUPPORT)
PREFILLED_SYRINGE | INTRAVENOUS | Status: AC
  Filled 2019-09-04: qty 10

## 2019-09-04 MED ORDER — ACETAMINOPHEN 325 MG PO TABS: 325.0000 mg | ORAL_TABLET | ORAL | Status: DC | PRN

## 2019-09-04 MED ORDER — PHENYLEPHRINE 40 MCG/ML (10ML) SYRINGE FOR IV PUSH (FOR BLOOD PRESSURE SUPPORT)
PREFILLED_SYRINGE | INTRAVENOUS | Status: DC | PRN
  Administered 2019-09-04: 120 ug via INTRAVENOUS

## 2019-09-04 MED ORDER — DEXMEDETOMIDINE HCL IN NACL 200 MCG/50ML IV SOLN
INTRAVENOUS | Status: DC | PRN
  Administered 2019-09-04 (×2): 8 ug via INTRAVENOUS

## 2019-09-04 SURGICAL SUPPLY — 79 items
AID PSTN UNV HD RSTRNT DISP (MISCELLANEOUS) ×2
BINDER ABDOMINAL 12 ML 46-62 (SOFTGOODS) ×3 IMPLANT
BLADE AVERAGE 25X9 (BLADE) IMPLANT
BLADE EXCALIBUR 4.0X13 (MISCELLANEOUS) IMPLANT
BLADE SURG 15 STRL LF DISP TIS (BLADE) IMPLANT
BLADE SURG 15 STRL SS (BLADE)
BLADE SURG SZ10 CARB STEEL (BLADE) IMPLANT
BURR OVAL 8 FLU 4.0X13 (MISCELLANEOUS) ×3 IMPLANT
CANNULA 5.75X7 CRYSTAL CLEAR (CANNULA) ×3 IMPLANT
CANNULA SHOULDER 7CM (CANNULA) ×3 IMPLANT
CLEANER CAUTERY TIP 5X5 PAD (MISCELLANEOUS) IMPLANT
COVER SURGICAL LIGHT HANDLE (MISCELLANEOUS) ×3 IMPLANT
COVER WAND RF STERILE (DRAPES) IMPLANT
DECANTER SPIKE VIAL GLASS SM (MISCELLANEOUS) IMPLANT
DISSECTOR  3.8MM X 13CM (MISCELLANEOUS)
DISSECTOR 3.8MM X 13CM (MISCELLANEOUS) IMPLANT
DRAPE IMP U-DRAPE 54X76 (DRAPES) ×6 IMPLANT
DRAPE ORTHO SPLIT 77X108 STRL (DRAPES) ×6
DRAPE SURG ORHT 6 SPLT 77X108 (DRAPES) ×4 IMPLANT
DRAPE U-SHAPE 47X51 STRL (DRAPES) ×3 IMPLANT
DRSG EMULSION OIL 3X3 NADH (GAUZE/BANDAGES/DRESSINGS) IMPLANT
DRSG PAD ABDOMINAL 8X10 ST (GAUZE/BANDAGES/DRESSINGS) ×6 IMPLANT
DURAPREP 26ML APPLICATOR (WOUND CARE) ×3 IMPLANT
ELECT MENISCUS 165MM 90D (ELECTRODE) IMPLANT
ELECT NEEDLE TIP 2.8 STRL (NEEDLE) IMPLANT
ELECT REM PT RETURN 15FT ADLT (MISCELLANEOUS) ×3 IMPLANT
EXCALIBUR 3.8MM X 13CM (MISCELLANEOUS) ×3 IMPLANT
GAUZE SPONGE 4X4 12PLY STRL (GAUZE/BANDAGES/DRESSINGS) ×3 IMPLANT
GAUZE XEROFORM 1X8 LF (GAUZE/BANDAGES/DRESSINGS) ×3 IMPLANT
GLOVE BIO SURGEON STRL SZ7.5 (GLOVE) ×3 IMPLANT
GLOVE BIO SURGEON STRL SZ8.5 (GLOVE) ×3 IMPLANT
GLOVE BIOGEL PI IND STRL 8.5 (GLOVE) ×4 IMPLANT
GLOVE BIOGEL PI INDICATOR 8.5 (GLOVE) ×2
GLOVE SURG ORTHO 8.0 STRL STRW (GLOVE) ×6 IMPLANT
GOWN STRL REUS W/ TWL LRG LVL3 (GOWN DISPOSABLE) ×2 IMPLANT
GOWN STRL REUS W/ TWL XL LVL3 (GOWN DISPOSABLE) ×4 IMPLANT
GOWN STRL REUS W/TWL LRG LVL3 (GOWN DISPOSABLE) ×3
GOWN STRL REUS W/TWL XL LVL3 (GOWN DISPOSABLE) ×6
KIT BASIN OR (CUSTOM PROCEDURE TRAY) ×3 IMPLANT
KIT TURNOVER KIT A (KITS) IMPLANT
MANIFOLD NEPTUNE II (INSTRUMENTS) ×3 IMPLANT
NDL SUT 6 .5 CRC .975X.05 MAYO (NEEDLE) IMPLANT
NEEDLE MAYO TAPER (NEEDLE)
NEEDLE SCORPION MULTI FIRE (NEEDLE) IMPLANT
NS IRRIG 1000ML POUR BTL (IV SOLUTION) IMPLANT
PACK ARTHROSCOPY DSU (CUSTOM PROCEDURE TRAY) ×3 IMPLANT
PAD CLEANER CAUTERY TIP 5X5 (MISCELLANEOUS)
PENCIL SMOKE EVACUATOR (MISCELLANEOUS) IMPLANT
PORT APPOLLO RF 90DEGREE MULTI (SURGICAL WAND) ×3 IMPLANT
PROBE BIPOLAR ARTHRO 85MM 30D (MISCELLANEOUS) IMPLANT
PROTECTOR NERVE ULNAR (MISCELLANEOUS) ×3 IMPLANT
RESTRAINT HEAD UNIVERSAL NS (MISCELLANEOUS) ×3 IMPLANT
SLEEVE SCD COMPRESS KNEE MED (MISCELLANEOUS) ×3 IMPLANT
SLING ARM FOAM STRAP LRG (SOFTGOODS) IMPLANT
SLING ARM IMMOBILIZER LRG (SOFTGOODS) ×3 IMPLANT
SPONGE LAP 4X18 RFD (DISPOSABLE) IMPLANT
STRIP CLOSURE SKIN 1/2X4 (GAUZE/BANDAGES/DRESSINGS) IMPLANT
SUCTION FRAZIER HANDLE 10FR (MISCELLANEOUS)
SUCTION TUBE FRAZIER 10FR DISP (MISCELLANEOUS) IMPLANT
SUT ETHILON 4 0 PS 2 18 (SUTURE) ×3 IMPLANT
SUT FIBERWIRE #2 38 T-5 BLUE (SUTURE)
SUT TIGER TAPE 7 IN WHITE (SUTURE) IMPLANT
SUT VIC AB 0 CT1 18XCR BRD 8 (SUTURE) IMPLANT
SUT VIC AB 0 CT1 27 (SUTURE)
SUT VIC AB 0 CT1 27XBRD ANBCTR (SUTURE) IMPLANT
SUT VIC AB 0 CT1 8-18 (SUTURE)
SUT VIC AB 2-0 SH 27 (SUTURE)
SUT VIC AB 2-0 SH 27XBRD (SUTURE) IMPLANT
SUT VIC AB 3-0 FS2 27 (SUTURE) IMPLANT
SUTURE FIBERWR #2 38 T-5 BLUE (SUTURE) IMPLANT
SYR 50ML LL SCALE MARK (SYRINGE) ×3 IMPLANT
SYR BULB 3OZ (MISCELLANEOUS) IMPLANT
TAPE FIBER 2MM 7IN #2 BLUE (SUTURE) IMPLANT
TOWEL OR 17X26 10 PK STRL BLUE (TOWEL DISPOSABLE) ×3 IMPLANT
TUBING ARTHROSCOPY IRRIG 16FT (MISCELLANEOUS) ×3 IMPLANT
TUBING CONNECTING 10 (TUBING) IMPLANT
UNDERPAD 30X36 HEAVY ABSORB (UNDERPADS AND DIAPERS) ×3 IMPLANT
WATER STERILE IRR 1000ML POUR (IV SOLUTION) ×3 IMPLANT
YANKAUER SUCT BULB TIP NO VENT (SUCTIONS) IMPLANT

## 2019-09-04 NOTE — Anesthesia Preprocedure Evaluation (Addendum)
Anesthesia Evaluation  Patient identified by MRN, date of birth, ID band Patient awake    Reviewed: Allergy & Precautions, H&P , NPO status , Patient's Chart, lab work & pertinent test results, reviewed documented beta blocker date and time   History of Anesthesia Complications (+) history of anesthetic complications  Airway Mallampati: II  TM Distance: >3 FB Neck ROM: Full    Dental no notable dental hx. (+) Teeth Intact, Dental Advisory Given   Pulmonary neg pulmonary ROS, sleep apnea ,    Pulmonary exam normal breath sounds clear to auscultation       Cardiovascular Exercise Tolerance: Good hypertension, Pt. on medications and Pt. on home beta blockers + CAD  negative cardio ROS   Rhythm:Regular Rate:Normal  TEE 5/20 intra-op  Left Ventricle: The left ventricle has normal systolic function, with an  ejection fraction of 55-60%. The cavity size was normal. There is no  increase in left ventricular wall thickness.    Neuro/Psych negative neurological ROS  negative psych ROS   GI/Hepatic negative GI ROS, Neg liver ROS, GERD  ,  Endo/Other  negative endocrine ROSdiabetes, Type 2, Oral Hypoglycemic Agents  Renal/GU negative Renal ROS  negative genitourinary   Musculoskeletal  (+) Arthritis , Osteoarthritis,    Abdominal   Peds  Hematology negative hematology ROS (+)   Anesthesia Other Findings   Reproductive/Obstetrics negative OB ROS                           Anesthesia Physical  Anesthesia Plan  ASA: III  Anesthesia Plan: General   Post-op Pain Management: GA combined w/ Regional for post-op pain   Induction: Intravenous  PONV Risk Score and Plan: 2 and Midazolam, Treatment may vary due to age or medical condition and Ondansetron  Airway Management Planned: Oral ETT  Additional Equipment:   Intra-op Plan:   Post-operative Plan:   Informed Consent: I have reviewed the  patients History and Physical, chart, labs and discussed the procedure including the risks, benefits and alternatives for the proposed anesthesia with the patient or authorized representative who has indicated his/her understanding and acceptance.     Dental advisory given  Plan Discussed with: CRNA, Anesthesiologist and Surgeon  Anesthesia Plan Comments: (  )        Anesthesia Quick Evaluation

## 2019-09-04 NOTE — Anesthesia Procedure Notes (Signed)
Procedure Name: Intubation Date/Time: 09/04/2019 1:25 PM Performed by: Adria Dill, CRNA Pre-anesthesia Checklist: Patient identified, Emergency Drugs available, Suction available and Patient being monitored Patient Re-evaluated:Patient Re-evaluated prior to induction Oxygen Delivery Method: Circle system utilized Preoxygenation: Pre-oxygenation with 100% oxygen Induction Type: IV induction Ventilation: Mask ventilation without difficulty and Oral airway inserted - appropriate to patient size Laryngoscope Size: Hyacinth Meeker and 2 Grade View: Grade I Tube type: Oral Tube size: 7.5 mm Number of attempts: 1 Airway Equipment and Method: Stylet and Oral airway Placement Confirmation: ETT inserted through vocal cords under direct vision,  positive ETCO2 and breath sounds checked- equal and bilateral Secured at: 22 cm Tube secured with: Tape Dental Injury: Teeth and Oropharynx as per pre-operative assessment

## 2019-09-04 NOTE — Progress Notes (Addendum)
Assisted Dr. Tacy Dura with left, ultrasound guided, interscalene  block. Side rails up, monitors on throughout procedure. See vital signs in flow sheet. Tolerated Procedure well. exparel given at 1205,09/04/19

## 2019-09-04 NOTE — H&P (Signed)
Danny Brewer MRN:  [id number] DOB/SEX:  June 22, 1970/male  CHIEF COMPLAINT:  Painful left shouder  HISTORY: Patient is a 49 y.o. male presented with a history of pain in the left shoulder. Onset of symptoms was gradual starting a few years ago with gradually worsening course since that time. Patient has been treated conservatively with over-the-counter NSAIDs and activity modification. Patient currently rates pain in the knee at 10 out of 10 with activity. There is pain at night.  PAST MEDICAL HISTORY: Patient Active Problem List   Diagnosis Date Noted  . Essential hypertension 10/31/2018  . Hyperlipidemia 10/31/2018  . Type 2 diabetes mellitus without complication, without long-term current use of insulin (HCC) 10/31/2018  . Coronary artery disease 10/17/2018  . Obstructive sleep apnea 10/15/2018  . CAD (coronary artery disease), native coronary artery 10/14/2018  . Chest pain with high risk of acute coronary syndrome 09/18/2018  . Abnormal MRI of head 03/31/2017  . Transient alteration of awareness 03/31/2017  . Snoring 03/31/2017  . Excessive daytime sleepiness 03/31/2017  . Back pain   . Paresthesias 08/26/2015  . Confusion 08/26/2015  . Sensory disturbance 08/26/2015  . Atypical chest pain 08/26/2015  . Dizziness and giddiness 06/06/2013  . Viral illness 06/06/2013  . GERD (gastroesophageal reflux disease) 06/06/2013   Past Medical History:  Diagnosis Date  . Arthritis   . Carotid US 10/15/2018   Carotid US 10/2018: bilateral ICA 1-39  . Complication of anesthesia    Pt verble and "prophecizes" after anesthesia  . Coronary Artery Disease    Coronary CTA 09/2018: pLAD 70-90; pD1+pD2 70-90; mRCA mixed plaque - cannot rule out 70-90; FFR suggests hemodynamically significant stenosis // LHC 10/2018: 2v CAD >> s/p  CABG (L-LAD, L radial-RI, S-D2, S-RCA) with Dr. Tyrone Sage   . Diabetes mellitus    type 2  . Echocardiogram 09/2018    Echo 09/2018: EF 60-65  . GERD   . History of  kidney stones   . Hyperlipidemia   . Hypertension   . Inguinal hernia 2016   right  . Pneumonia   . Vision abnormalities    Past Surgical History:  Procedure Laterality Date  . CORONARY ARTERY BYPASS GRAFT N/A 10/17/2018   Procedure: CORONARY ARTERY BYPASS GRAFTING (CABG), ON PUMP, TIMES FOUR, USING LIMA TO LAD, ENDOSCOPICALLY HARVESTED GREATER SAPHENOUS VEIN TO DIAG 2, SVG TO DISTAL RCA, AND LEFT RADIAL TO RAMUS INTERMEDIUS;  Surgeon: [name], MD;  Location: [facility] OR;  Service: Open Heart Surgery;  Laterality: N/A;  . ENDOVEIN HARVEST OF GREATER SAPHENOUS VEIN Right 10/17/2018   Procedure: Mack Guise Of Greater Saphenous Vein;  Surgeon: [name], MD;  Location: [facility] OR;  Service: Open Heart Surgery;  Laterality: Right;  . HAND SURGERY Left 1987  . HERNIA REPAIR    . INGUINAL HERNIA REPAIR Bilateral 05/27/2015   Procedure: LAPAROSCOPIC BILATERAL  INGUINAL HERNIA REPAIRS WITH MESH;  Surgeon: Karie Soda, MD;  Location: [facility] ORS;  Service: General;  Laterality: Bilateral;  . KNEE SURGERY     left scope  . LEFT HEART CATH AND CORONARY ANGIOGRAPHY N/A 10/14/2018   Procedure: LEFT HEART CATH AND CORONARY ANGIOGRAPHY;  Surgeon: Lyn Records, MD;  Location: [facility] INVASIVE CV LAB;  Service: Cardiovascular;  Laterality: N/A;  . RADIAL ARTERY HARVEST Left 10/17/2018   Procedure: RADIAL ARTERY HARVEST;  Surgeon: [name], MD;  Location: [facility] OR;  Service: Open Heart Surgery;  Laterality: Left;  . TEE WITHOUT CARDIOVERSION N/A 10/17/2018   Procedure: TRANSESOPHAGEAL  ECHOCARDIOGRAM (TEE);  Surgeon: [name], MD;  Location: [facility] OR;  Service: Open Heart Surgery;  Laterality: N/A;     MEDICATIONS:   No medications prior to admission.    ALLERGIES:   Allergies  Allergen Reactions  . Antihistamines, Diphenhydramine-Type Hives and Other (See Comments)    Seizures  . Demerol Hives  . Morphine And Related Hives    Esp with high dose morphine, possibly  Dilaudid Tolerates hydrocodone, oxycodone    REVIEW OF SYSTEMS:  A comprehensive review of systems was negative except for: Musculoskeletal: positive for arthralgias and bone pain   FAMILY HISTORY:   Family History  Problem Relation Age of Onset  . Hypertension Mother   . Cancer Father   . Heart failure Father   . Sudden death Brother   . Heart attack Brother   . Hypertension Sister   . Heart murmur Sister     SOCIAL HISTORY:   Social History   Tobacco Use  . Smoking status: Never Smoker  . Smokeless tobacco: Never Used  Substance Use Topics  . Alcohol use: No     EXAMINATION:  Vital signs in last 24 hours:    There were no vitals taken for this visit.  General Appearance:    Alert, cooperative, no distress, appears stated age  Head:    Normocephalic, without obvious abnormality, atraumatic  Eyes:    PERRL, conjunctiva/corneas clear, EOM's intact, fundi    benign, both eyes       Ears:    Normal TM's and external ear canals, both ears  Nose:   Nares normal, septum midline, mucosa normal, no drainage    or sinus tenderness  Throat:   Lips, mucosa, and tongue normal; teeth and gums normal  Neck:   Supple, symmetrical, trachea midline, no adenopathy;       thyroid:  No enlargement/tenderness/nodules; no carotid   bruit or JVD  Back:     Symmetric, no curvature, ROM normal, no CVA tenderness  Lungs:     Clear to auscultation bilaterally, respirations unlabored  Chest wall:    No tenderness or deformity  Heart:    Regular rate and rhythm, S1 and S2 normal, no murmur, rub   or gallop  Abdomen:     Soft, non-tender, bowel sounds active all four quadrants,    no masses, no organomegaly  Genitalia:    Normal male without lesion, discharge or tenderness  Rectal:    Normal tone, normal prostate, no masses or tenderness;   guaiac negative stool  Extremities:   Extremities normal, atraumatic, no cyanosis or edema  Pulses:   2+ and symmetric all extremities  Skin:   Skin  color, texture, turgor normal, no rashes or lesions  Lymph nodes:   Cervical, supraclavicular, and axillary nodes normal  Neurologic:   CNII-XII intact. Normal strength, sensation and reflexes      throughout     Imaging Review Plain radiographs demonstrate subacromial spur and impingement of the left shoulder. The overall alignment is neutral. The bone quality appears to be good for age and reported activity level.  MRI scan confirms severe impingement with rotator cuff tendinopathy  Assessment/Plan: Left shoulder impingment  The patient history, physical examination and imaging studies are consistent with impingement syndrome of the left shoulder. The patient has failed conservative treatment.  The clearance notes were reviewed.  After discussion with the patient it was felt that shoulder arthroscopy was indicated. The procedure,  risks, and benefits of shoulder arthroscopy  were presented and reviewed. The risks including but not limited to infection, blood clots, vascular injury, stiffness,  complications among others were discussed. The patient acknowledged the explanation, agreed to proceed with the plan.         Donia Ast 09/04/2019, 9:19 AM

## 2019-09-04 NOTE — Progress Notes (Signed)
Called patient's wife that patient has left for surgery. The next person to call her will be Dr Sherlean Foot. She verbalizes understanding.

## 2019-09-04 NOTE — Anesthesia Procedure Notes (Signed)
Anesthesia Regional Block: Interscalene brachial plexus block   Pre-Anesthetic Checklist: ,, timeout performed, Correct Patient, Correct Site, Correct Laterality, Correct Procedure, Correct Position, site marked, Risks and benefits discussed,  Surgical consent,  Pre-op evaluation,  At surgeon's request and post-op pain management  Laterality: Left  Prep: chloraprep       Needles:  Injection technique: Single-shot  Needle Type: Echogenic Stimulator Needle     Needle Length: 5cm  Needle Gauge: 22     Additional Needles:   Procedures:, nerve stimulator,,, ultrasound used (permanent image in chart),,,,   Nerve Stimulator or Paresthesia:  Response: hand, 0.45 mA,   Additional Responses:   Narrative:  Start time: 09/04/2019 12:05 PM End time: 09/04/2019 12:10 PM Injection made incrementally with aspirations every 5 mL.  Performed by: Personally  Anesthesiologist: Bethena Midget, MD  Additional Notes: Functioning IV was confirmed and monitors were applied.  A 35mm 22ga Arrow echogenic stimulator needle was used. Sterile prep and drape,hand hygiene and sterile gloves were used. Ultrasound guidance: relevant anatomy identified, needle position confirmed, local anesthetic spread visualized around nerve(s)., vascular puncture avoided.  Image printed for medical record. Negative aspiration and negative test dose prior to incremental administration of local anesthetic. The patient tolerated the procedure well.

## 2019-09-04 NOTE — Transfer of Care (Signed)
Immediate Anesthesia Transfer of Care Note  Patient: Danny Brewer  Procedure(s) Performed: SHOULDER ARTHROSCOPY WITH SUBACROMIAL DECOMPRESSION AND DISTAL CLAVICLE EXCISION (Left ) CLOSED MANIPULATION SHOULDER (Left Shoulder)  Patient Location: PACU  Anesthesia Type:GA combined with regional for post-op pain  Level of Consciousness: drowsy, patient cooperative and responds to stimulation  Airway & Oxygen Therapy: Patient Spontanous Breathing and Patient connected to face mask oxygen  Post-op Assessment: Report given to RN and Post -op Vital signs reviewed and stable  Post vital signs: Reviewed and stable  Last Vitals:  Vitals Value Taken Time  BP 128/82 09/04/19 1445  Temp    Pulse 65 09/04/19 1446  Resp 12 09/04/19 1446  SpO2 100 % 09/04/19 1446  Vitals shown include unvalidated device data.  Last Pain:  Vitals:   09/04/19 1445  TempSrc:   PainSc: (P) Asleep         Complications: No apparent anesthesia complications

## 2019-09-04 NOTE — Anesthesia Postprocedure Evaluation (Signed)
Anesthesia Post Note  Patient: Lyell Clugston  Procedure(s) Performed: SHOULDER ARTHROSCOPY WITH SUBACROMIAL DECOMPRESSION AND DISTAL CLAVICLE EXCISION (Left ) CLOSED MANIPULATION SHOULDER (Left Shoulder)     Patient location during evaluation: PACU Anesthesia Type: General Level of consciousness: awake and alert Pain management: pain level controlled Vital Signs Assessment: post-procedure vital signs reviewed and stable Respiratory status: spontaneous breathing, nonlabored ventilation, respiratory function stable and patient connected to nasal cannula oxygen Cardiovascular status: blood pressure returned to baseline and stable Postop Assessment: no apparent nausea or vomiting Anesthetic complications: no    Last Vitals:  Vitals:   09/04/19 1545 09/04/19 1555  BP: 117/86 (!) 126/93  Pulse: 73 63  Resp: 14 14  Temp: 36.8 C 36.4 C  SpO2: 95% 95%    Last Pain:  Vitals:   09/04/19 1555  TempSrc: Oral  PainSc: 0-No pain                 Jaeleigh Monaco

## 2019-09-07 NOTE — Op Note (Signed)
Preop diagnosis: Left shoulder impingement, AC joint arthritis  Postoperative diagnosis: Same  Procedure: Left shoulder arthroscopy, subacromial decompression, distal clavicle resection  Indication for procedure: The patient is a 49 year old black male with failure of conservative measures for left shoulder impingement syndrome.  Informed consent obtained.  Description of procedure:  The patient was taken to the operating room after an interscalene block and placed in the beachchair position.  The left shoulder is prepped and draped in usual fashion.  Anterior posterior direct lateral portals were created with a #11 blade blunt trocar and cannula.  Diagnostic arthroscopy revealed minimal chondromalacia in the joint and no labral tearing.  The undersurface of the rotator cuff looked excellent as did the biceps tendon.  I then redirected the scope from the posterior portal into the subacromial space.  From the direct lateral portal I performed a bursectomy with a small gray-white shaver.  I then used the ArthroCare debridement wand to release the CA ligament and clean off the undersurface of the acromion distal clavicle.  I then used a 4.0 mm cylindrical bur perform aggressive acromioplasty and distal clavicle resection removing approximately 10 mm of inferior bone off clavicle.  This afforded excellent decompression again minimal rotator cuff partial tearing.  I then lavaged and closed with 4 nylon sutures dressed with Xeroform dressing sponges sterile wearable 2 inches silk tape simple sling.  Complications: None  EBL: Minimal end dictation thank you

## 2019-09-13 ENCOUNTER — Encounter: Payer: Self-pay | Admitting: Internal Medicine

## 2019-09-13 ENCOUNTER — Telehealth (INDEPENDENT_AMBULATORY_CARE_PROVIDER_SITE_OTHER): Payer: BC Managed Care – PPO | Admitting: Internal Medicine

## 2019-09-13 VITALS — Wt 195.0 lb

## 2019-09-13 DIAGNOSIS — Z951 Presence of aortocoronary bypass graft: Secondary | ICD-10-CM

## 2019-09-13 DIAGNOSIS — M791 Myalgia, unspecified site: Secondary | ICD-10-CM

## 2019-09-13 DIAGNOSIS — I2510 Atherosclerotic heart disease of native coronary artery without angina pectoris: Secondary | ICD-10-CM | POA: Diagnosis not present

## 2019-09-13 DIAGNOSIS — E785 Hyperlipidemia, unspecified: Secondary | ICD-10-CM

## 2019-09-13 DIAGNOSIS — T466X5A Adverse effect of antihyperlipidemic and antiarteriosclerotic drugs, initial encounter: Secondary | ICD-10-CM

## 2019-09-13 DIAGNOSIS — E118 Type 2 diabetes mellitus with unspecified complications: Secondary | ICD-10-CM

## 2019-09-13 MED ORDER — REPATHA SURECLICK 140 MG/ML ~~LOC~~ SOAJ: 1.0000 | SUBCUTANEOUS | 11 refills | Status: DC

## 2019-09-13 NOTE — Patient Instructions (Addendum)
Medication Instructions:  Dr. Rennis Golden recommends Repatha 140mg /mL (PCSK9). This is an injectable cholesterol medication self-administered once every 14 days. This medication will need prior approval with your insurance company, which we will work on. If the medication is not approved initially, we may need to do an appeal with your insurance. We will keep you updated on this process.   Administer medication in area of fatty tissue such as abdomen, outer thigh, back up of arm - and rotate site with each injection Store medication in refrigerator until ready to administer - allow to sit at room temp for 30 mins - 1 hour prior to injection Dispose of medication in a SHARPS container - your pharmacy should be able to direct you on this and proper disposal   If you need co-pay assistance, please look into the program at [email or web address] >> disease funds >> hypercholesterolemia. This is an online application or you can call to complete.   If you need a co-pay card for Repatha: >> paying for Repatha If you need a co-pay card for Praluent: [email or web address] >> starting & paying for Praluent  *If you need a refill on your cardiac medications before your next appointment, please call your pharmacy*   Lab Work: Lab work to check LPa in the next few weeks  FASTING lab work in 3-4 months to check cholesterol  This can be completed at any [facility] location If you wish to complete at [email or web address] office, please let [employer] know a day you can go, so we can schedule this If you come to Dr. [place] office ([address]), no appointment is needed  If you have labs (blood work) drawn today and your tests are completely normal, you will receive your results only by: [employer] MyChart Message (if you have MyChart) OR . A paper copy in the mail If you have any lab test that is abnormal or we need to change your treatment, we will call you to review the  results.   Testing/Procedures: NONE   Follow-Up: At [facility], you and your health needs are our priority.  As part of our continuing mission to provide you with exceptional heart care, we have created designated Provider Care Teams.  These Care Teams include your primary Cardiologist (physician) and Advanced Practice Providers (APPs -  Physician Assistants and Nurse Practitioners) who all work together to provide you with the care you need, when you need it.  We recommend signing up for the patient portal called "MyChart".  Sign up information is provided on this After Visit Summary.  MyChart is used to connect with patients for Virtual Visits (Telemedicine).  Patients are able to view lab/test results, encounter notes, upcoming appointments, etc.  Non-urgent messages can be sent to your provider as well.   To learn more about what you can do with MyChart, go to [facility].    Your next appointment:   3-4 month(s) - lipid clinic  The format for your next appointment:   Either In Person or Virtual  Provider:   [email or web address] Hilty, MD   Other Instructions

## 2019-09-13 NOTE — Progress Notes (Signed)
Virtual Visit via Telephone Note   This visit type was conducted due to national recommendations for restrictions regarding the COVID-19 Pandemic (e.g. social distancing) in an effort to limit this patient's exposure and mitigate transmission in our community.  Due to his co-morbid illnesses, this patient is at least at moderate risk for complications without adequate follow up.  This format is felt to be most appropriate for this patient at this time.  The patient did not have access to video technology/had technical difficulties with video requiring transitioning to audio format only (telephone).  All issues noted in this document were discussed and addressed.  No physical exam could be performed with this format.  Please refer to the patient's chart for his  consent to telehealth for [facility].   Evaluation Performed:  Telephone consult  Date:  09/13/2019   ID:  Ella Guillotte, DOB 11/23/1970, MRN [id number]  Patient Location:  [address] [place] [address]  Provider location:   [address] [place], [place] [address]  PCP:  Ileana Ladd, MD  Cardiologist:  Chrystie Nose, MD Electrophysiologist:  None   Chief Complaint:  Manage dyslipidemia  History of Present Illness:    Danny Brewer is a 49 y.o. male who presents via audio/video conferencing for a telehealth visit today.  Danny Brewer is a pleasant 49 year old male who unfortunately had early onset heart disease.  He also had a brother who had a heart attack in his 30s.  Unfortunately he underwent four-vessel bypass surgery in April 2020.  He also has carotid artery disease, type 2 diabetes, hypertension and other cardiovascular risk factors.  He has been unable to tolerate statins in the past due to myalgias and has been managed on ezetimibe.  More recently his lipid showed a total cholesterol 209, triglycerides 72, HDL 47 and LDL 149.  His target LDL is less than 70.  Although he does have a  dyslipidemia, it is not the marked dyslipidemia I would expect with familial hyperlipidemia.  He also does not have evidence of a mixed dyslipidemia which we typically see with patients that have diabetes.  We talked about options to help him reach his target LDL including dietary changes and weight loss which she is already involved in.  He seems highly motivated to be healthier.  With that being said, I think is a good candidate for PCSK9 inhibitor.  The patient does not have symptoms concerning for COVID-19 infection (fever, chills, cough, or new SHORTNESS OF BREATH).    Prior CV studies:   The following studies were reviewed today:  Chart reviewed  PMHx:  Past Medical History:  Diagnosis Date  . Arthritis   . Carotid US 10/15/2018   Carotid US 10/2018: bilateral ICA 1-39  . Complication of anesthesia    Pt verble and "prophecizes" after anesthesia  . Coronary Artery Disease    Coronary CTA 09/2018: pLAD 70-90; pD1+pD2 70-90; mRCA mixed plaque - cannot rule out 70-90; FFR suggests hemodynamically significant stenosis // LHC 10/2018: 2v CAD >> s/p  CABG (L-LAD, L radial-RI, S-D2, S-RCA) with Dr. Tyrone Sage   . Diabetes mellitus    type 2  . Echocardiogram 09/2018    Echo 09/2018: EF 60-65  . GERD   . History of kidney stones   . Hyperlipidemia   . Hypertension   . Inguinal hernia 2016   right  . Pneumonia   . Vision abnormalities     Past Surgical History:  Procedure Laterality Date  . CORONARY ARTERY BYPASS GRAFT N/A 10/17/2018   Procedure: CORONARY ARTERY BYPASS GRAFTING (CABG), ON PUMP, TIMES FOUR, USING LIMA TO LAD, ENDOSCOPICALLY HARVESTED GREATER SAPHENOUS VEIN TO DIAG 2, SVG TO DISTAL RCA, AND LEFT RADIAL TO RAMUS INTERMEDIUS;  Surgeon: [name], MD;  [facility] OR;  Service: Open Heart Surgery;  Laterality: N/A;  . ENDOVEIN HARVEST OF GREATER SAPHENOUS VEIN Right 10/17/2018   Procedure: Mack Guise Of Greater Saphenous Vein;  Surgeon: [name], MD;   Location: [facility] OR;  Service: Open Heart Surgery;  Laterality: Right;  . HAND SURGERY Left 1987  . HERNIA REPAIR    . INGUINAL HERNIA REPAIR Bilateral 05/27/2015   Procedure: LAPAROSCOPIC BILATERAL  INGUINAL HERNIA REPAIRS WITH MESH;  Surgeon: Karie Soda, MD;  Location: [facility] ORS;  Service: General;  Laterality: Bilateral;  . KNEE SURGERY     left scope  . LEFT HEART CATH AND CORONARY ANGIOGRAPHY N/A 10/14/2018   Procedure: LEFT HEART CATH AND CORONARY ANGIOGRAPHY;  Surgeon: Lyn Records, MD;  [facility]: [facility] INVASIVE CV LAB;  Service: Cardiovascular;  Laterality: N/A;  . RADIAL ARTERY HARVEST Left 10/17/2018   Procedure: RADIAL ARTERY HARVEST;  Surgeon: [name], MD;  Location: [facility] OR;  Service: Open Heart Surgery;  Laterality: Left;  . SHOULDER CLOSED REDUCTION Left 09/04/2019   Procedure: CLOSED MANIPULATION SHOULDER;  Surgeon: Dannielle Huh, MD;  Location: [facility] ORS;  Service: Orthopedics;  Laterality: Left;  . TEE WITHOUT CARDIOVERSION N/A 10/17/2018   Procedure: TRANSESOPHAGEAL ECHOCARDIOGRAM (TEE);  Surgeon: [name], MD;  Location: [facility] OR;  Service: Open Heart Surgery;  Laterality: N/A;    FAMHx:  Family History  Problem Relation Age of Onset  . Hypertension Mother   . Cancer Father   . Heart failure Father   . Sudden death Brother   . Heart attack Brother   . Hypertension Sister   . Heart murmur Sister     SOCHx:   reports that he has never smoked. He has never used smokeless tobacco. He reports that he does not drink alcohol or use drugs.  ALLERGIES:  Allergies  Allergen Reactions  . Antihistamines, Diphenhydramine-Type Hives and Other (See Comments)    Seizures  . Demerol Hives  . Morphine And Related Hives    Esp with high dose morphine, possibly Dilaudid Tolerates hydrocodone, oxycodone    MEDS:  Current Meds  Medication Sig  . acetaminophen (TYLENOL) 325 MG tablet Take 650 mg by mouth every 6 (six) hours as needed for moderate pain.  Marland Kitchen  amoxicillin-clavulanate (AUGMENTIN) 500-125 MG tablet Take 1 tablet by mouth 3 (three) times daily.  Marland Kitchen aspirin 81 MG tablet Take 1 tablet (81 mg total) by mouth daily.  Marland Kitchen ezetimibe (ZETIA) 10 MG tablet TAKE 1 TABLET BY MOUTH EVERY DAY (Patient taking differently: Take 10 mg by mouth daily. )  . metoprolol succinate (TOPROL-XL) 50 MG 24 hr tablet Take 1 tablet (50 mg total) by mouth daily. Take with or immediately following a meal.  . oxyCODONE (OXY IR/ROXICODONE) 5 MG immediate release tablet Take 1-2 tablets (5-10 mg total) by mouth every 6 (six) hours as needed for severe pain.  . sitaGLIPtin (JANUVIA) 100 MG tablet Take 100 mg by mouth daily.  . tamsulosin (FLOMAX) 0.4 MG CAPS capsule Take 0.4 mg by mouth daily.     ROS: Pertinent items noted in HPI and remainder of comprehensive ROS otherwise negative.  Labs/Other Tests and Data Reviewed:  Recent Labs: 09/18/2018: TSH 3.900 10/18/2018: Magnesium 2.3 08/30/2019: BUN 17; Creatinine, Ser 1.35; Hemoglobin 14.0; Platelets 202; Potassium 3.8; Sodium 137 09/01/2019: ALT 20   Recent Lipid Panel Lab Results  Component Value Date/Time   CHOL 209 (H) 09/01/2019 08:20 AM   TRIG 72 09/01/2019 08:20 AM   HDL 47 09/01/2019 08:20 AM   CHOLHDL 4.4 09/01/2019 08:20 AM   CHOLHDL 4.3 09/19/2018 02:43 AM   LDLCALC 149 (H) 09/01/2019 08:20 AM    Wt Readings from Last 3 Encounters:  09/13/19 195 lb (88.5 kg)  09/04/19 199 lb 15.3 oz (90.7 kg)  08/30/19 200 lb (90.7 kg)     Exam:    Vital Signs:  Wt 195 lb (88.5 kg)   BMI 28.80 kg/m    Exam not performed due to telephone visit  ASSESSMENT & PLAN:    1. Dyslipidemia, goal LDL less than 70 2. Coronary artery disease, premature onset status post CABG (09/2018) 3. Type 2 diabetes 4. Hypertension  Mr. Francis has premature onset coronary disease and a strong family history of early coronary disease however does not have typical cholesterol features of familial hyperlipidemia.  His cholesterol  does remain above target and he is intolerant to statins causing myalgias therefore he is a good candidate for PCSK9 inhibitor.  He is also already on ezetimibe.  I will plan to continue that and will add Repatha 140 mg every 2 weeks.  After prior authorization he wishes for [place] to observe him given the injections which we will arrange for and plan a repeat lipid in about 3 months.  I had also like to check a baseline LP(a) as this is a possible cause of his premature coronary disease.  If this is elevated it would be interesting to see if the PCSK9 inhibitor also helps lower that as there is some evidence that may reduce it by 20 to 30%.  Additionally, there is a local clinical trial enrolling patients on specific LP(a) antisense therapy which could be an option for him.  Thanks again for the kind referral.  COVID-19 Education: The signs and symptoms of COVID-19 were discussed with the patient and how to seek care for testing (follow up with PCP or arrange E-visit).  The importance of social distancing was discussed today.  Patient Risk:   After full review of this patients clinical status, I feel that they are at least moderate risk at this time.  Time:   Today, I have spent 25 minutes with the patient with telehealth technology discussing dyslipidemia, early onset coronary disease, statin intolerance, PCSK9 inhibitors.     Medication Adjustments/Labs and Tests Ordered: Current medicines are reviewed at length with the patient today.  Concerns regarding medicines are outlined above.   Tests Ordered: Orders Placed This Encounter  Procedures  . Lipid panel  . Lipoprotein A (LPA)  . Lipoprotein A (LPA)    Medication Changes: Meds ordered this encounter  Medications  . Evolocumab (REPATHA SURECLICK) 140 MG/ML SOAJ    Sig: Inject 1 Dose into the skin every 14 (fourteen) days.    Dispense:  2 pen    Refill:  11    Disposition:  in 4 month(s)  Chrystie Nose, MD, [facility], FACP  [facility]  [facility]  Medical Director of the Advanced Lipid Disorders &  Cardiovascular Risk Reduction Clinic Diplomate of the American Board of Clinical Lipidology Attending Cardiologist  Direct Dial: [phone]  Fax: [phone]  Website:  [email or web address]  Chrystie Nose,  MD  09/13/2019 9:55 AM

## 2019-09-14 ENCOUNTER — Other Ambulatory Visit: Payer: Self-pay | Admitting: Internal Medicine

## 2019-09-14 NOTE — Telephone Encounter (Signed)
Patient had not reviewed AVS instructions in MyChart from 4/7 telehealth visit.  Reviewed with him, he will have LP(a) tomorrow and asked for metabolic panel to check potassium as he is having leg cramps Asked that he review MyChart instructions He reports he has BCBS & Medicaid as secondary insurance

## 2019-09-15 NOTE — Telephone Encounter (Signed)
Yes .. ok to change to 150 praluent.  Dr Rexene Edison

## 2019-09-16 LAB — COMPREHENSIVE METABOLIC PANEL
ALT: 18 IU/L (ref 0–44)
AST: 18 IU/L (ref 0–40)
Albumin/Globulin Ratio: 1.4 (ref 1.2–2.2)
Albumin: 4.1 g/dL (ref 4.0–5.0)
Alkaline Phosphatase: 81 IU/L (ref 39–117)
BUN/Creatinine Ratio: 11 (ref 9–20)
BUN: 16 mg/dL (ref 6–24)
Bilirubin Total: 0.3 mg/dL (ref 0.0–1.2)
CO2: 20 mmol/L (ref 20–29)
Calcium: 8.8 mg/dL (ref 8.7–10.2)
Chloride: 105 mmol/L (ref 96–106)
Creatinine, Ser: 1.43 mg/dL — ABNORMAL HIGH (ref 0.76–1.27)
GFR calc Af Amer: 66 mL/min/{1.73_m2} (ref >59)
GFR calc non Af Amer: 57 mL/min/{1.73_m2} — ABNORMAL LOW (ref >59)
Globulin, Total: 2.9 g/dL (ref 1.5–4.5)
Glucose: 89 mg/dL (ref 65–99)
Potassium: 5 mmol/L (ref 3.5–5.2)
Sodium: 139 mmol/L (ref 134–144)
Total Protein: 7 g/dL (ref 6.0–8.5)

## 2019-09-16 LAB — LIPID PANEL
Chol/HDL Ratio: 4.5 ratio (ref 0.0–5.0)
Cholesterol, Total: 195 mg/dL (ref 100–199)
HDL: 43 mg/dL (ref >39)
LDL Chol Calc (NIH): 137 mg/dL — ABNORMAL HIGH (ref 0–99)
Triglycerides: 82 mg/dL (ref 0–149)
VLDL Cholesterol Cal: 15 mg/dL (ref 5–40)

## 2019-09-16 LAB — LIPOPROTEIN A (LPA): Lipoprotein (a): 357.3 nmol/L — ABNORMAL HIGH (ref <75.0)

## 2019-09-19 NOTE — Telephone Encounter (Signed)
Patient called and reviewed lab results. Explained that final report from MD is not available but that creatine is up, but stable, K+ normal, LP(a) elevated, lipid panel review (was not actually needed at this time).   He will contact pharmacy to obtain medication and get back with our office to arrange first injection with pharmacy team

## 2019-09-22 ENCOUNTER — Telehealth: Payer: Self-pay | Admitting: Internal Medicine

## 2019-09-22 NOTE — Telephone Encounter (Signed)
PA for Praluent submitted via CMM Approved: 09/22/2019 - 09/21/2020

## 2019-11-09 ENCOUNTER — Telehealth: Payer: Self-pay | Admitting: Cardiovascular Disease

## 2019-11-09 ENCOUNTER — Encounter (HOSPITAL_COMMUNITY): Payer: Self-pay

## 2019-11-09 ENCOUNTER — Emergency Department (HOSPITAL_COMMUNITY)
Admission: EM | Admit: 2019-11-09 | Discharge: 2019-11-09 | Disposition: A | Discharge status: Home / Self Care | Payer: BC Managed Care – PPO | Attending: Emergency Medicine | Admitting: Emergency Medicine

## 2019-11-09 ENCOUNTER — Other Ambulatory Visit: Payer: Self-pay

## 2019-11-09 ENCOUNTER — Emergency Department (HOSPITAL_COMMUNITY): Payer: BC Managed Care – PPO

## 2019-11-09 DIAGNOSIS — R079 Chest pain, unspecified: Secondary | ICD-10-CM | POA: Insufficient documentation

## 2019-11-09 DIAGNOSIS — Z951 Presence of aortocoronary bypass graft: Secondary | ICD-10-CM | POA: Insufficient documentation

## 2019-11-09 DIAGNOSIS — I2510 Atherosclerotic heart disease of native coronary artery without angina pectoris: Secondary | ICD-10-CM | POA: Insufficient documentation

## 2019-11-09 DIAGNOSIS — E119 Type 2 diabetes mellitus without complications: Secondary | ICD-10-CM | POA: Insufficient documentation

## 2019-11-09 DIAGNOSIS — I10 Essential (primary) hypertension: Secondary | ICD-10-CM | POA: Diagnosis not present

## 2019-11-09 DIAGNOSIS — Z7982 Long term (current) use of aspirin: Secondary | ICD-10-CM | POA: Insufficient documentation

## 2019-11-09 DIAGNOSIS — Z79899 Other long term (current) drug therapy: Secondary | ICD-10-CM | POA: Diagnosis not present

## 2019-11-09 LAB — CBC
HCT: 43.9 % (ref 39.0–52.0)
Hemoglobin: 13.8 g/dL (ref 13.0–17.0)
MCH: 28.5 pg (ref 26.0–34.0)
MCHC: 31.4 g/dL (ref 30.0–36.0)
MCV: 90.5 fL (ref 80.0–100.0)
Platelets: 209 10*3/uL (ref 150–400)
RBC: 4.85 MIL/uL (ref 4.22–5.81)
RDW: 13.2 % (ref 11.5–15.5)
WBC: 6.9 10*3/uL (ref 4.0–10.5)
nRBC: 0 % (ref 0.0–0.2)

## 2019-11-09 LAB — BASIC METABOLIC PANEL
Anion gap: 7 (ref 5–15)
BUN: 10 mg/dL (ref 6–20)
CO2: 28 mmol/L (ref 22–32)
Calcium: 8.8 mg/dL — ABNORMAL LOW (ref 8.9–10.3)
Chloride: 105 mmol/L (ref 98–111)
Creatinine, Ser: 1.38 mg/dL — ABNORMAL HIGH (ref 0.61–1.24)
GFR calc Af Amer: >60 mL/min (ref >60)
GFR calc non Af Amer: 59 mL/min — ABNORMAL LOW (ref >60)
Glucose, Bld: 98 mg/dL (ref 70–99)
Potassium: 4.2 mmol/L (ref 3.5–5.1)
Sodium: 140 mmol/L (ref 135–145)

## 2019-11-09 LAB — TROPONIN I (HIGH SENSITIVITY)
Troponin I (High Sensitivity): 3 ng/L (ref <18)
Troponin I (High Sensitivity): <2 ng/L (ref <18)

## 2019-11-09 LAB — D-DIMER, QUANTITATIVE: D-Dimer, Quant: 0.34 ug/mL-FEU (ref 0.00–0.50)

## 2019-11-09 NOTE — ED Provider Notes (Signed)
[facility] EMERGENCY DEPARTMENT Provider Note   CSN: [id number] Arrival date & time: 11/09/19  0845     History Chief Complaint  Patient presents with  . Chest Pain  . Arm Pain    Danny Brewer is a 49 y.o. male.  HPI Patient presents with left chest pain.  Has been coming and going.  With head for a few days now.  Lasts a while when it comes on and does not associate with exercise.  States he was able to do a fair amount of physical activity yesterday and the pain did not come on however it will come on sometimes at rest and sometimes while he is exerting.  It is sharp.  Is in the anterior left lower chest.  No fevers.  States it does stay constantly but sometimes has worse pain.  Also has numbness in the left arm at times.  States he thought this was due to his recent surgery and the arm although it had been coming and going during that time or last couple months.  Has had episodes of it before.  Also some neck pain.  No fevers.  No cough.  The numbness in the arm does not necessarily associate with the chest pain.  Does have history of a coronary artery bypass last year.    Past Medical History:  Diagnosis Date  . Arthritis   . Carotid US 10/15/2018   Carotid US 10/2018: bilateral ICA 1-39  . Complication of anesthesia    Pt verble and "prophecizes" after anesthesia  . Coronary Artery Disease    Coronary CTA 09/2018: pLAD 70-90; pD1+pD2 70-90; mRCA mixed plaque - cannot rule out 70-90; FFR suggests hemodynamically significant stenosis // LHC 10/2018: 2v CAD >> s/p  CABG (L-LAD, L radial-RI, S-D2, S-RCA) with Dr. Tyrone Sage   . Diabetes mellitus    type 2  . Echocardiogram 09/2018    Echo 09/2018: EF 60-65  . GERD   . History of kidney stones   . Hyperlipidemia   . Hypertension   . Inguinal hernia 2016   right  . Pneumonia   . Vision abnormalities     Patient Active Problem List   Diagnosis Date Noted  . Essential hypertension 10/31/2018  . Hyperlipidemia  10/31/2018  . Type 2 diabetes mellitus without complication, without long-term current use of insulin (HCC) 10/31/2018  . Coronary artery disease 10/17/2018  . Obstructive sleep apnea 10/15/2018  . CAD (coronary artery disease), native coronary artery 10/14/2018  . Chest pain with high risk of acute coronary syndrome 09/18/2018  . Abnormal MRI of head 03/31/2017  . Transient alteration of awareness 03/31/2017  . Snoring 03/31/2017  . Excessive daytime sleepiness 03/31/2017  . Back pain   . Paresthesias 08/26/2015  . Confusion 08/26/2015  . Sensory disturbance 08/26/2015  . Atypical chest pain 08/26/2015  . Dizziness and giddiness 06/06/2013  . Viral illness 06/06/2013  . GERD (gastroesophageal reflux disease) 06/06/2013    Past Surgical History:  Procedure Laterality Date  . CORONARY ARTERY BYPASS GRAFT N/A 10/17/2018   Procedure: CORONARY ARTERY BYPASS GRAFTING (CABG), ON PUMP, TIMES FOUR, USING LIMA TO LAD, ENDOSCOPICALLY HARVESTED GREATER SAPHENOUS VEIN TO DIAG 2, SVG TO DISTAL RCA, AND LEFT RADIAL TO RAMUS INTERMEDIUS;  Surgeon: [name], MD;  Location: [facility] OR;  Service: Open Heart Surgery;  Laterality: N/A;  . ENDOVEIN HARVEST OF GREATER SAPHENOUS VEIN Right 10/17/2018   Procedure: Mack Guise Of Greater Saphenous Vein;  Surgeon: [name],  MD;  Location: [facility] OR;  Service: Open Heart Surgery;  Laterality: Right;  . HAND SURGERY Left 1987  . HERNIA REPAIR    . INGUINAL HERNIA REPAIR Bilateral 05/27/2015   Procedure: LAPAROSCOPIC BILATERAL  INGUINAL HERNIA REPAIRS WITH MESH;  Surgeon: Michael Boston, MD;  Location: [facility] ORS;  Service: General;  Laterality: Bilateral;  . KNEE SURGERY     left scope  . LEFT HEART CATH AND CORONARY ANGIOGRAPHY N/A 10/14/2018   Procedure: LEFT HEART CATH AND CORONARY ANGIOGRAPHY;  Surgeon: Belva Crome, MD;  Location: [place] CV LAB;  Service: Cardiovascular;  Laterality: N/A;  . RADIAL ARTERY HARVEST Left 10/17/2018   Procedure:  RADIAL ARTERY HARVEST;  Surgeon: Grace Isaac, MD;  Location: [place];  Service: Open Heart Surgery;  Laterality: Left;  . SHOULDER CLOSED REDUCTION Left 09/04/2019   Procedure: CLOSED MANIPULATION SHOULDER;  Surgeon: Vickey Huger, MD;  Location: [facility] ORS;  Service: Orthopedics;  Laterality: Left;  . TEE WITHOUT CARDIOVERSION N/A 10/17/2018   Procedure: TRANSESOPHAGEAL ECHOCARDIOGRAM (TEE);  Surgeon: Grace Isaac, MD;  Location: [place];  Service: Open Heart Surgery;  Laterality: N/A;       Family History  Problem Relation Age of Onset  . Hypertension Mother   . Cancer Father   . Heart failure Father   . Sudden death Brother   . Heart attack Brother   . Hypertension Sister   . Heart murmur Sister     Social History   Tobacco Use  . Smoking status: Never Smoker  . Smokeless tobacco: Never Used  Substance Use Topics  . Alcohol use: No  . Drug use: No    Home Medications Prior to Admission medications   Medication Sig Start Date End Date Taking? Authorizing Provider  acetaminophen (TYLENOL) 325 MG tablet Take 650 mg by mouth every 6 (six) hours as needed for moderate pain.   Yes [provider]  Alirocumab (PRALUENT) 150 MG/ML SOAJ Inject 1 Dose into the skin every 14 (fourteen) days. 09/15/19  Yes Hilty, Nadean Corwin, MD  aspirin 81 MG tablet Take 1 tablet (81 mg total) by mouth daily. 10/11/18  Yes Weaver, Scott T, PA-C  ezetimibe (ZETIA) 10 MG tablet TAKE 1 TABLET BY MOUTH EVERY DAY Patient taking differently: Take 10 mg by mouth daily.  08/22/19  Yes Sande Rives E, PA-C  metoprolol succinate (TOPROL-XL) 50 MG 24 hr tablet Take 1 tablet (50 mg total) by mouth daily. Take with or immediately following a meal. 05/31/19  Yes Weaver, Scott T, PA-C  sitaGLIPtin (JANUVIA) 100 MG tablet Take 100 mg by mouth daily.   Yes [provider]  tamsulosin (FLOMAX) 0.4 MG CAPS capsule Take 0.4 mg by mouth daily.   Yes [provider]  amoxicillin-clavulanate  (AUGMENTIN) 500-125 MG tablet Take 1 tablet by mouth 3 (three) times daily.    [provider]  oxyCODONE (OXY IR/ROXICODONE) 5 MG immediate release tablet Take 1-2 tablets (5-10 mg total) by mouth every 6 (six) hours as needed for severe pain. Patient not taking: Reported on 11/09/2019 09/04/19   Donia Ast, PA    Allergies    Antihistamines, diphenhydramine-type; Demerol; and Morphine and related  Review of Systems   Review of Systems  Constitutional: Negative for appetite change.  HENT: Negative for congestion.   Respiratory: Negative for shortness of breath.   Cardiovascular: Positive for chest pain.  Gastrointestinal: Negative for abdominal pain.  Genitourinary: Negative for flank pain.  Musculoskeletal: Positive for neck  pain. Negative for back pain.  Neurological: Positive for numbness.  Psychiatric/Behavioral: Negative for confusion.    Physical Exam Updated Vital Signs BP 140/89 (BP Location: Right Arm)   Pulse 66   Temp 98.3 F (36.8 C) (Oral)   Resp 13   Ht 5\' 9"  (1.753 m)   Wt 89.4 kg   SpO2 95%   BMI 29.09 kg/m   Physical Exam Vitals and nursing note reviewed.  HENT:     Head: Normocephalic.  Neck:     Comments: Some tenderness over left paraspinal musculature. Cardiovascular:     Rate and Rhythm: Normal rate and regular rhythm.  Pulmonary:     Breath sounds: No wheezing, rhonchi or rales.  Chest:     Chest wall: Tenderness present.     Comments: Tenderness left anterior lower chest wall.  No rash.  No deformity. Abdominal:     Palpations: There is no mass.     Tenderness: There is abdominal tenderness.  Musculoskeletal:     Cervical back: Neck supple.     Comments: Strength and sensation grossly intact in bilateral upper extremities.  Good range of motion left upper extremity.  Pulse intact left wrist.  Skin:    General: Skin is warm.     Capillary Refill: Capillary refill takes less than 2 seconds.  Neurological:     Mental  Status: He is alert and oriented to person, place, and time.     ED Results / Procedures / Treatments   Labs (all labs ordered are listed, but only abnormal results are displayed) Labs Reviewed  BASIC METABOLIC PANEL - Abnormal; Notable for the following components:      Result Value   Creatinine, Ser 1.38 (*)    Calcium 8.8 (*)    GFR calc non Af Amer 59 (*)    All other components within normal limits  CBC  D-DIMER, QUANTITATIVE (NOT AT [facility])  TROPONIN I (HIGH SENSITIVITY)  TROPONIN I (HIGH SENSITIVITY)    EKG EKG Interpretation  Date/Time:  Thursday November 09 2019 08:44:38 EDT Ventricular Rate:  71 PR Interval:  142 QRS Duration: 88 QT Interval:  364 QTC Calculation: 395 R Axis:   75 Text Interpretation: Normal sinus rhythm Normal ECG Confirmed by 06-05-2006 [phone]) on 11/09/2019 9:24:18 AM   Radiology DG Chest 2 View  Result Date: 11/09/2019 CLINICAL DATA:  Chest pain EXAM: CHEST - 2 VIEW COMPARISON:  07/18/2019 FINDINGS: Normal heart size and mediastinal contours. No acute infiltrate or edema. No effusion or pneumothorax. No acute osseous findings. IMPRESSION: No active cardiopulmonary disease. Electronically Signed   By: 09/15/2019 M.D.   On: 11/09/2019 09:31    Procedures Procedures (including critical care time)  Medications Ordered in ED Medications - No data to display  ED Course  I have reviewed the triage vital signs and the nursing notes.  Pertinent labs & imaging results that were available during my care of the patient were reviewed by me and considered in my medical decision making (see chart for details).    MDM Rules/Calculators/A&P                      Patient with chest pain.  Has had for the last few days.  Dull with episodes of sharp pain.  Has had previous CABG.  Troponin negative x2.  Negative D-dimer.  I think pulmonary embolism ruled out.  No fevers.  Doubt cardiac cause.  Not exertional and has been exerting during  the time  without any pain.  Also has some pain in his neck goes down the arm.  Has had this before.  Likely more musculoskeletal as the cause.  Will discharge home with outpatient follow-up. Final Clinical Impression(s) / ED Diagnoses Final diagnoses:  Nonspecific chest pain    Rx / DC Orders ED Discharge Orders    None       Benjiman Core, MD 11/09/19 1344

## 2019-11-09 NOTE — ED Notes (Deleted)
DR Doylene Canard here to  Speak to pt and her husband about surgery, pros and cons ,, pt to be admitted

## 2019-11-09 NOTE — Telephone Encounter (Signed)
Call transferred to triage by scheduling. Patient calling in for chest pain that comes and goes since this weekend. Pain is described as sharp. When it comes on he gets tightness when breathing. Pain is accompanied by left arm tightness. Patient reports he also has a constant dull ache under his left breast rated 5/10.  Chest pain comes on with activity. He relaxes and rests when it occurs. No nausea or dizziness.   Patient advised to go to the nearest emergency room for evaluation due to active chest pain at the time of the call. Patient verbalized understanding. Patient going to [facility].

## 2019-11-09 NOTE — Telephone Encounter (Signed)
Agree.  Thank you. Tereso Newcomer, PA-C    11/09/2019 8:41 AM

## 2019-11-09 NOTE — Telephone Encounter (Signed)
Pt c/o of Chest Pain: STAT if CP now or developed within 24 hours  1. Are you having CP right now? yes  2. Are you experiencing any other symptoms (ex. SOB, nausea, vomiting, sweating)? no  3. How long have you been experiencing CP? This weekend  4. Is your CP continuous or coming and going? Comes and goes  5. Have you taken Nitroglycerin? No   Patient states he started having chest pain on the left side of his chest this weekend. He states it feels tight under his left breast area.  ?

## 2019-11-09 NOTE — ED Triage Notes (Signed)
Pt reports starting on Sunday his left arm felt like it was asleep, pt then had pain radiate to his chest, constant pain on the left side, dull. Pt denies sob or nausea. Pt states he had quadruple bypass surgery last year. Pt a.o, nad noted.

## 2020-01-01 ENCOUNTER — Encounter: Payer: Self-pay | Admitting: Internal Medicine

## 2020-01-01 ENCOUNTER — Other Ambulatory Visit: Payer: Self-pay

## 2020-01-01 ENCOUNTER — Ambulatory Visit (INDEPENDENT_AMBULATORY_CARE_PROVIDER_SITE_OTHER): Payer: BC Managed Care – PPO | Admitting: Internal Medicine

## 2020-01-01 VITALS — BP 121/80 | HR 73 | Ht 69.0 in | Wt 209.0 lb

## 2020-01-01 DIAGNOSIS — E785 Hyperlipidemia, unspecified: Secondary | ICD-10-CM | POA: Diagnosis not present

## 2020-01-01 DIAGNOSIS — E7841 Elevated Lipoprotein(a): Secondary | ICD-10-CM | POA: Diagnosis not present

## 2020-01-01 DIAGNOSIS — Z951 Presence of aortocoronary bypass graft: Secondary | ICD-10-CM

## 2020-01-01 DIAGNOSIS — I2510 Atherosclerotic heart disease of native coronary artery without angina pectoris: Secondary | ICD-10-CM | POA: Diagnosis not present

## 2020-01-01 NOTE — Patient Instructions (Addendum)
Medication Instructions:  No Changes *If you need a refill on your cardiac medications before your next appointment, please call your pharmacy*   Lab Work: Fasting Lipid Panel in Late October If you have labs (blood work) drawn today and your tests are completely normal, you will receive your results only by: Marland Kitchen MyChart Message (if you have MyChart) OR . A paper copy in the mail If you have any lab test that is abnormal or we need to change your treatment, we will call you to review the results.   Testing/Procedures: None Ordered   Follow-Up: At [facility], you and your health needs are our priority.  As part of our continuing mission to provide you with exceptional heart care, we have created designated Provider Care Teams.  These Care Teams include your primary Cardiologist (physician) and Advanced Practice Providers (APPs -  Physician Assistants and Nurse Practitioners) who all work together to provide you with the care you need, when you need it.  We recommend signing up for the patient portal called "MyChart".  Sign up information is provided on this After Visit Summary.  MyChart is used to connect with patients for Virtual Visits (Telemedicine).  Patients are able to view lab/test results, encounter notes, upcoming appointments, etc.  Non-urgent messages can be sent to your provider as well.   To learn more about what you can do with MyChart, go to [email or web address].    Your next appointment:   3 month(s)  The format for your next appointment:   In Person  Provider:   Kirtland Bouchard Italy Hilty, MD   Other Instructions Mercy Riding, Pharm. D., CPP  [facility] Clinical Trial Location [address] [place], [place] [address] Phone: [phone]  Medication Management - Anti LP(a) trial

## 2020-01-01 NOTE — Progress Notes (Signed)
LIPID CLINIC CONSULT NOTE  Chief Complaint:  Follow-up dyslipidemia  Primary Care Physician: Ileana Ladd, MD  Primary Cardiologist:  Chrystie Nose, MD  HPI:  Danny Brewer is a 50 y.o. male who is being seen today for the evaluation of dyslipidemia at the request of Ileana Ladd, MD. 49 y.o. male who presents via audio/video conferencing for a telehealth visit today.  Danny Brewer is a pleasant 49 year old male who unfortunately had early onset heart disease.  He also had a brother who had a heart attack in his 30s.  Unfortunately he underwent four-vessel bypass surgery in April 2020.  He also has carotid artery disease, type 2 diabetes, hypertension and other cardiovascular risk factors.  He has been unable to tolerate statins in the past due to myalgias and has been managed on ezetimibe.  More recently his lipid showed a total cholesterol 209, triglycerides 72, HDL 47 and LDL 149.  His target LDL is less than 70.  Although he does have a dyslipidemia, it is not the marked dyslipidemia I would expect with familial hyperlipidemia.  He also does not have evidence of a mixed dyslipidemia which we typically see with patients that have diabetes.  We talked about options to help him reach his target LDL including dietary changes and weight loss which she is already involved in.  He seems highly motivated to be healthier.  With that being said, I think is a good candidate for PCSK9 inhibitor.  01/01/2020  Danny Brewer is seen today in follow-up.  I last saw him via telemedicine visit in April.  He underwent repeat lipid testing which showed persistent dyslipidemia above target LDL of 70 however his LP(a) was also significantly elevated at 357.  He was approved for Praluent however unfortunately did not get the medicine until just last week.  He has not started it yet since he wanted instructions on how to inject it.  I also discussed the clinical [facility] for [facility] therapy of LP(a).  PMHx:  Past Medical History:  Diagnosis Date   Arthritis    Carotid US 10/15/2018   Carotid US 10/2018: bilateral ICA 1-39   Complication of anesthesia    Pt verble and "prophecizes" after anesthesia   Coronary Artery Disease    Coronary CTA 09/2018: pLAD 70-90; pD1+pD2 70-90; mRCA mixed plaque - cannot rule out 70-90; FFR suggests hemodynamically significant stenosis // LHC 10/2018: 2v CAD >> s/p  CABG (L-LAD, L radial-RI, S-D2, S-RCA) with Dr. Tyrone Sage    Diabetes mellitus    type 2   Echocardiogram 09/2018    Echo 09/2018: EF 60-65   GERD    History of kidney stones    Hyperlipidemia    Hypertension    Inguinal hernia 2016   right   Pneumonia    Vision abnormalities     Past Surgical History:  Procedure Laterality Date   CORONARY ARTERY BYPASS GRAFT N/A 10/17/2018   Procedure: CORONARY ARTERY BYPASS GRAFTING (CABG), ON PUMP, TIMES FOUR, USING LIMA TO LAD, ENDOSCOPICALLY HARVESTED GREATER SAPHENOUS VEIN TO DIAG 2, SVG TO DISTAL RCA, AND LEFT RADIAL TO RAMUS INTERMEDIUS;  Surgeon: [name], MD;  Location: [facility] OR;  Service: Open Heart Surgery;  Laterality: N/A;   ENDOVEIN HARVEST OF GREATER SAPHENOUS VEIN Right 10/17/2018   Procedure: Mack Guise Of Greater Saphenous Vein;  Surgeon: [name], MD;  Location: [facility] OR;  Service: Open Heart Surgery;  Laterality:  Right;   HAND SURGERY Left 1987   HERNIA REPAIR     INGUINAL HERNIA REPAIR Bilateral 05/27/2015   Procedure: LAPAROSCOPIC BILATERAL  INGUINAL HERNIA REPAIRS WITH MESH;  Surgeon: Karie Soda, MD;  Location: [facility] ORS;  Service: General;  Laterality: Bilateral;   KNEE SURGERY     left scope   LEFT HEART CATH AND CORONARY ANGIOGRAPHY N/A 10/14/2018   Procedure: LEFT HEART CATH AND CORONARY ANGIOGRAPHY;  Surgeon: Lyn Records, MD;  Location: [facility] INVASIVE CV LAB;  Service: Cardiovascular;  Laterality: N/A;   RADIAL ARTERY HARVEST Left 10/17/2018    Procedure: RADIAL ARTERY HARVEST;  Surgeon: [name], MD;  Location: [facility] OR;  Service: Open Heart Surgery;  Laterality: Left;   SHOULDER CLOSED REDUCTION Left 09/04/2019   Procedure: CLOSED MANIPULATION SHOULDER;  Surgeon: Dannielle Huh, MD;  Location: [facility] ORS;  Service: Orthopedics;  Laterality: Left;   TEE WITHOUT CARDIOVERSION N/A 10/17/2018   Procedure: TRANSESOPHAGEAL ECHOCARDIOGRAM (TEE);  Surgeon: [name], MD;  Location: [facility] OR;  Service: Open Heart Surgery;  Laterality: N/A;    FAMHx:  Family History  Problem Relation Age of Onset   Hypertension Mother    Cancer Father    Heart failure Father    Sudden death Brother    Heart attack Brother    Hypertension Sister    Heart murmur Sister     SOCHx:   reports that he has never smoked. He has never used smokeless tobacco. He reports that he does not drink alcohol and does not use drugs.  ALLERGIES:  Allergies  Allergen Reactions   Antihistamines, Diphenhydramine-Type Hives and Other (See Comments)    Seizures   Demerol Hives   Morphine And Related Hives    Esp with high dose morphine, possibly Dilaudid Tolerates hydrocodone, oxycodone    ROS: Pertinent items noted in HPI and remainder of comprehensive ROS otherwise negative.  HOME MEDS: Current Outpatient Medications on File Prior to Visit  Medication Sig Dispense Refill   acetaminophen (TYLENOL) 325 MG tablet Take 650 mg by mouth every 6 (six) hours as needed for moderate pain.     Alirocumab (PRALUENT) 150 MG/ML SOAJ Inject 1 Dose into the skin every 14 (fourteen) days. 2 pen 11   amoxicillin-clavulanate (AUGMENTIN) 500-125 MG tablet Take 1 tablet by mouth 3 (three) times daily.     aspirin 81 MG tablet Take 1 tablet (81 mg total) by mouth daily.     ezetimibe (ZETIA) 10 MG tablet TAKE 1 TABLET BY MOUTH EVERY DAY (Patient taking differently: Take 10 mg by mouth daily. ) 90 tablet 3   metoprolol succinate (TOPROL-XL) 50 MG 24 hr tablet  Take 1 tablet (50 mg total) by mouth daily. Take with or immediately following a meal. 90 tablet 3   oxyCODONE (OXY IR/ROXICODONE) 5 MG immediate release tablet Take 1-2 tablets (5-10 mg total) by mouth every 6 (six) hours as needed for severe pain. 20 tablet 0   sitaGLIPtin (JANUVIA) 100 MG tablet Take 100 mg by mouth daily.     tamsulosin (FLOMAX) 0.4 MG CAPS capsule Take 0.4 mg by mouth daily.     No current facility-administered medications on file prior to visit.    LABS/IMAGING: No results found for this or any previous visit (from the past 48 hour(s)). No results found.  LIPID PANEL:    Component Value Date/Time   CHOL 195 09/15/2019 1237   TRIG 82 09/15/2019 1237   HDL 43 09/15/2019 1237   CHOLHDL  4.5 09/15/2019 1237   CHOLHDL 4.3 09/19/2018 0243   VLDL 13 09/19/2018 0243   LDLCALC 137 (H) 09/15/2019 1237    WEIGHTS: Wt Readings from Last 3 Encounters:  01/01/20 (!) 209 lb (94.8 kg)  11/09/19 197 lb (89.4 kg)  09/13/19 195 lb (88.5 kg)    VITALS: BP 121/80    Pulse 73    Ht 5\' 9"  (1.753 m)    Wt (!) 209 lb (94.8 kg)    SpO2 99%    BMI 30.86 kg/m   EXAM: Deferred  EKG: Deferred  ASSESSMENT: 1. Dyslipidemia, goal LDL less than 70 2. 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 Coronary artery disease, premature onset status post CABG (09/2018) 4. Type 2 diabetes 5. Hypertension  PLAN: 1.   Danny Brewer has dyslipidemia as above target LDL less than 70.  He is a good candidate to add PCSK9 inhibitor therapy including the fact that he has significant elevation in LP(a) at 357.  He was approved for Praluent but has not started it.  He wants some education on using it.  He has not had repeat lipid testing however will wait another few months after he has been on therapy to reassess his numbers.  He is also interested in the clinical trial for anti-LP(a) therapy.  I will refer him for that.  Plan follow-up with me after lipids in 3 months.  Mardella Layman, MD, [facility], FACP  [address] [place]    [facility]  Medical Director of the Advanced Lipid Disorders &  Cardiovascular Risk Reduction Clinic Diplomate of the American Board of Clinical Lipidology Attending Cardiologist  Direct Dial: [phone]   Fax: [phone]  Website:  [email or web address] 01/01/2020, 3:38 PM

## 2020-02-16 ENCOUNTER — Other Ambulatory Visit: Payer: Self-pay | Admitting: Orthopedic Surgery

## 2020-02-19 ENCOUNTER — Other Ambulatory Visit: Payer: Self-pay | Admitting: Orthopedic Surgery

## 2020-02-19 DIAGNOSIS — M25562 Pain in left knee: Secondary | ICD-10-CM

## 2020-02-21 ENCOUNTER — Encounter: Payer: Self-pay | Admitting: Cardiology

## 2020-02-26 ENCOUNTER — Telehealth: Payer: Self-pay | Admitting: Internal Medicine

## 2020-02-26 NOTE — Telephone Encounter (Signed)
Pt c/o medication issue:  1. Name of Medication: Alirocumab (PRALUENT) 150 MG/ML SOAJ  2. How are you currently taking this medication (dosage and times per day)? Inject 1 dose every 14 days  3. Are you having a reaction (difficulty breathing--STAT)? no  4. What is your medication issue? Patient would like to know if it is okay to take the praluent with his other medications. He would also like to know if he could have an appointment with a nurse to show him how to inject the medication.

## 2020-02-26 NOTE — Telephone Encounter (Signed)
We would be happy to teach him how to inject his Praluent at either the [address] or [employer]

## 2020-02-26 NOTE — Telephone Encounter (Signed)
Per secure chat from [place] - 9/22 @ 8am, 11am, 1pm  Left message for patient to call back

## 2020-02-26 NOTE — Telephone Encounter (Signed)
Spoke with patient about Praluent. He has been approved sine 09/2019. He has not yet started the medication but is ready to. He would like to know if he can come by for first injection on Wednesday or Thursday - will check with [facility] to see if this is possible  [facility] video via MyChart

## 2020-02-26 NOTE — Telephone Encounter (Signed)
[facility] available time:  9/21 at 9:30am, 11:30am or 1pm 9/22 at 11:30am

## 2020-02-27 NOTE — Telephone Encounter (Signed)
Spoke with patient who will come tomorrow 02/28/20 @ 11am for praluent demo/injection in office with [facility]  Patient states he has been working on diet - lost 10lbs  He has been rescheduled from 04/08/20 to 05/29/20 to allow ample time on praluent therapy before repeat of lab work   He plans to get his COVID vaccine next week (which would be his off week from PCSK9) and asked if this was OK. Explained it is recommended to get the COVID vaccine and he would not need to hold praluent dose for vaccination

## 2020-02-29 ENCOUNTER — Telehealth: Payer: Self-pay | Admitting: Internal Medicine

## 2020-02-29 NOTE — Telephone Encounter (Signed)
We are recommending the COVID-19 vaccine to all of our patients. Cardiac medications (including blood thinners) should not deter anyone from being vaccinated and there is no need to hold any of those medications prior to vaccine administration.     Currently, there is a hotline to call (active 06/16/19) to schedule vaccination appointments as no walk-ins will be accepted.   Number: [phone].    If an appointment is not available please go to [email or web address] to sign up for notification when additional vaccine appointments are available.   If you have further questions or concerns about the vaccine process, please visit [email or web address] or contact your primary care physician.   Patient still wanted to speak with a nurse.

## 2020-02-29 NOTE — Telephone Encounter (Signed)
Called and scheduled 03/05/20 at 4pm scheduled for praluent demo

## 2020-02-29 NOTE — Telephone Encounter (Signed)
Spoke with patient about the COVID vaccine and reiterated the recommendations as outlined below. He asked how bad getting COVID can be and I explained that the symptoms can vary and you cannot pinpoint one patient population or comorbidity that would be more or less impacted.   He also asked about coming by for Praluent demo - he was supposed to come yesterday but had mandatory COVID screening for workplace. He would like to come next Tuesday - will check with [facility] on this.

## 2020-03-09 ENCOUNTER — Ambulatory Visit
Admission: RE | Admit: 2020-03-09 | Discharge: 2020-03-09 | Disposition: A | Discharge status: Home / Self Care | Payer: BC Managed Care – PPO | Source: Ambulatory Visit | Attending: Orthopedic Surgery | Admitting: Orthopedic Surgery

## 2020-03-09 ENCOUNTER — Other Ambulatory Visit: Payer: Self-pay

## 2020-03-09 DIAGNOSIS — M25562 Pain in left knee: Secondary | ICD-10-CM

## 2020-03-13 ENCOUNTER — Ambulatory Visit: Payer: BC Managed Care – PPO | Attending: Family

## 2020-03-13 DIAGNOSIS — Z23 Encounter for immunization: Secondary | ICD-10-CM

## 2020-04-05 ENCOUNTER — Ambulatory Visit: Payer: BC Managed Care – PPO | Attending: Family

## 2020-04-05 DIAGNOSIS — Z23 Encounter for immunization: Secondary | ICD-10-CM

## 2020-04-08 ENCOUNTER — Ambulatory Visit: Payer: BC Managed Care – PPO | Admitting: Internal Medicine

## 2020-04-18 NOTE — Progress Notes (Signed)
   Covid-19 Vaccination Clinic  Name:  Wrangler Penning    MRN: [id number] DOB: [birth year]  04/18/2020  Mr. Danh was observed post Covid-19 immunization for 15 minutes without incident. He was provided with Vaccine Information Sheet and instruction to access the V-Safe system.   Mr. Holsworth was instructed to call 911 with any severe reactions post vaccine: Marland Kitchen Difficulty breathing  . Swelling of face and throat  . A fast heartbeat  . A bad rash all over body  . Dizziness and weakness   Immunizations Administered    Name Date Dose VIS Date Route   Pfizer COVID-19 Vaccine 03/13/2020  2:00 PM 0.3 mL 03/27/2020 Intramuscular   Manufacturer: [employer], [employer]   Lot: [id number]   NDC: [address]-9

## 2020-04-20 ENCOUNTER — Other Ambulatory Visit: Payer: Self-pay | Admitting: Physician Assistant

## 2020-04-22 ENCOUNTER — Other Ambulatory Visit: Payer: Self-pay

## 2020-04-22 ENCOUNTER — Emergency Department (HOSPITAL_COMMUNITY): Payer: BC Managed Care – PPO

## 2020-04-22 ENCOUNTER — Encounter (HOSPITAL_COMMUNITY): Payer: Self-pay | Admitting: Emergency Medicine

## 2020-04-22 ENCOUNTER — Emergency Department (HOSPITAL_COMMUNITY)
Admission: EM | Admit: 2020-04-22 | Discharge: 2020-04-23 | Disposition: A | Discharge status: Home / Self Care | Payer: BC Managed Care – PPO | Attending: Emergency Medicine | Admitting: Emergency Medicine

## 2020-04-22 DIAGNOSIS — Z7982 Long term (current) use of aspirin: Secondary | ICD-10-CM | POA: Insufficient documentation

## 2020-04-22 DIAGNOSIS — I2510 Atherosclerotic heart disease of native coronary artery without angina pectoris: Secondary | ICD-10-CM | POA: Insufficient documentation

## 2020-04-22 DIAGNOSIS — Z955 Presence of coronary angioplasty implant and graft: Secondary | ICD-10-CM | POA: Insufficient documentation

## 2020-04-22 DIAGNOSIS — Z79899 Other long term (current) drug therapy: Secondary | ICD-10-CM | POA: Insufficient documentation

## 2020-04-22 DIAGNOSIS — I10 Essential (primary) hypertension: Secondary | ICD-10-CM | POA: Diagnosis not present

## 2020-04-22 DIAGNOSIS — E119 Type 2 diabetes mellitus without complications: Secondary | ICD-10-CM | POA: Diagnosis not present

## 2020-04-22 DIAGNOSIS — Z7984 Long term (current) use of oral hypoglycemic drugs: Secondary | ICD-10-CM | POA: Diagnosis not present

## 2020-04-22 DIAGNOSIS — R202 Paresthesia of skin: Secondary | ICD-10-CM | POA: Insufficient documentation

## 2020-04-22 DIAGNOSIS — G43109 Migraine with aura, not intractable, without status migrainosus: Secondary | ICD-10-CM | POA: Diagnosis not present

## 2020-04-22 LAB — DIFFERENTIAL
Abs Immature Granulocytes: 0.01 10*3/uL (ref 0.00–0.07)
Basophils Absolute: 0 10*3/uL (ref 0.0–0.1)
Basophils Relative: 1 %
Eosinophils Absolute: 0.1 10*3/uL (ref 0.0–0.5)
Eosinophils Relative: 2 %
Immature Granulocytes: 0 %
Lymphocytes Relative: 39 %
Lymphs Abs: 3.1 10*3/uL (ref 0.7–4.0)
Monocytes Absolute: 0.8 10*3/uL (ref 0.1–1.0)
Monocytes Relative: 10 %
Neutro Abs: 3.9 10*3/uL (ref 1.7–7.7)
Neutrophils Relative %: 48 %

## 2020-04-22 LAB — COMPREHENSIVE METABOLIC PANEL
ALT: 21 U/L (ref 0–44)
AST: 26 U/L (ref 15–41)
Albumin: 4.1 g/dL (ref 3.5–5.0)
Alkaline Phosphatase: 56 U/L (ref 38–126)
Anion gap: 9 (ref 5–15)
BUN: 10 mg/dL (ref 6–20)
CO2: 25 mmol/L (ref 22–32)
Calcium: 9 mg/dL (ref 8.9–10.3)
Chloride: 106 mmol/L (ref 98–111)
Creatinine, Ser: 1.32 mg/dL — ABNORMAL HIGH (ref 0.61–1.24)
GFR, Estimated: >60 mL/min (ref >60)
Glucose, Bld: 83 mg/dL (ref 70–99)
Potassium: 3.7 mmol/L (ref 3.5–5.1)
Sodium: 140 mmol/L (ref 135–145)
Total Bilirubin: 0.6 mg/dL (ref 0.3–1.2)
Total Protein: 7 g/dL (ref 6.5–8.1)

## 2020-04-22 LAB — CBC
HCT: 45.9 % (ref 39.0–52.0)
Hemoglobin: 14.3 g/dL (ref 13.0–17.0)
MCH: 28.1 pg (ref 26.0–34.0)
MCHC: 31.2 g/dL (ref 30.0–36.0)
MCV: 90.2 fL (ref 80.0–100.0)
Platelets: 237 10*3/uL (ref 150–400)
RBC: 5.09 MIL/uL (ref 4.22–5.81)
RDW: 12.6 % (ref 11.5–15.5)
WBC: 8 10*3/uL (ref 4.0–10.5)
nRBC: 0 % (ref 0.0–0.2)

## 2020-04-22 LAB — I-STAT CHEM 8, ED
BUN: 13 mg/dL (ref 6–20)
Calcium, Ion: 1.1 mmol/L — ABNORMAL LOW (ref 1.15–1.40)
Chloride: 106 mmol/L (ref 98–111)
Creatinine, Ser: 1.3 mg/dL — ABNORMAL HIGH (ref 0.61–1.24)
Glucose, Bld: 81 mg/dL (ref 70–99)
HCT: 44 % (ref 39.0–52.0)
Hemoglobin: 15 g/dL (ref 13.0–17.0)
Potassium: 3.4 mmol/L — ABNORMAL LOW (ref 3.5–5.1)
Sodium: 144 mmol/L (ref 135–145)
TCO2: 24 mmol/L (ref 22–32)

## 2020-04-22 LAB — CBG MONITORING, ED: Glucose-Capillary: 98 mg/dL (ref 70–99)

## 2020-04-22 LAB — PROTIME-INR
INR: 1 (ref 0.8–1.2)
Prothrombin Time: 12.5 seconds (ref 11.4–15.2)

## 2020-04-22 LAB — APTT: aPTT: 29 seconds (ref 24–36)

## 2020-04-22 LAB — ETHANOL: Alcohol, Ethyl (B): <10 mg/dL (ref <10)

## 2020-04-22 MED ORDER — PROCHLORPERAZINE EDISYLATE 10 MG/2ML IJ SOLN
10.0000 mg | Freq: Once | INTRAMUSCULAR | Status: AC
  Administered 2020-04-22: 10 mg via INTRAVENOUS
  Filled 2020-04-22: qty 2

## 2020-04-22 MED ORDER — DEXAMETHASONE SODIUM PHOSPHATE 10 MG/ML IJ SOLN
10.0000 mg | Freq: Once | INTRAMUSCULAR | Status: AC
  Administered 2020-04-22: 10 mg via INTRAVENOUS
  Filled 2020-04-22: qty 1

## 2020-04-22 NOTE — ED Triage Notes (Addendum)
Patient arrived from home reports sudden onset left arm/hand numbness with dysarthria and right facial asymmetry LSN [id number] this evening , evaluated by EDP and neurologist at arrival then transported to CT #2.

## 2020-04-22 NOTE — Consult Note (Signed)
Neurology Consultation  Reason for Consult: Code stroke-left-sided sensory deficit Referring Physician: Dr. Blane Ohara, ED provider  CC: Sudden onset of left-sided sensory deficits, headache  History is obtained from: Patient, wife, chart  HPI: Danny Brewer is a 49 y.o. male past medical history of diabetes, hyperlipidemia, coronary artery disease status post CABG, hypertension, presenting to the emergency room for evaluation of sudden onset of left-sided numbness with last known well at around 10 PM on 04/22/2020. According to the patient, he was at home, sitting in bed, when he had a sudden onset of feeling numbness on the left face and arm along with some quivering of his jaw.  His arm felt as if it has gone to sleep.  Also complained of a headache.  Also felt some slurred speech at the time, that has resolved.  Symptoms did not resolve within a few minutes and he was driven in to the hospital by his wife.  He was evaluated [place] the bridge and a code stroke was activated for sudden onset focal neurological deficit. On my examination-see my exam below, low NIH stroke scale. Also reports ongoing stressors at work that have been bothering him quite a lot. Chart review reveals an admission for headache and left-sided sensory symptoms in 2017.  MRI brain was unremarkable for acute process but had a T2/flair hyperintensity in the brainstem which was nonspecific. During this encounter, he did appear to be extremely anxious and was very worried about his numbness. Denies any ongoing chest pain.  No shortness of breath.  No fevers or chills.  Denies any exposure to sick people.   LKW: 10 PM on 04/22/2020 tpa given?: no, not likely a stroke, low NIH stroke scale Premorbid modified Rankin scale (mRS):0   ROS: Performed and negative except as noted in HPI  Past Medical History:  Diagnosis Date  . Arthritis   . Carotid US 10/15/2018   Carotid US 10/2018: bilateral ICA 1-39  . Complication of  anesthesia    Pt verble and "prophecizes" after anesthesia  . Coronary Artery Disease    Coronary CTA 09/2018: pLAD 70-90; pD1+pD2 70-90; mRCA mixed plaque - cannot rule out 70-90; FFR suggests hemodynamically significant stenosis // LHC 10/2018: 2v CAD >> s/p  CABG (L-LAD, L radial-RI, S-D2, S-RCA) with Dr. Tyrone Sage   . Diabetes mellitus    type 2  . Echocardiogram 09/2018    Echo 09/2018: EF 60-65  . GERD   . History of kidney stones   . Hyperlipidemia   . Hypertension   . Inguinal hernia 2016   right  . Pneumonia   . Vision abnormalities      Family History  Problem Relation Age of Onset  . Hypertension Mother   . Cancer Father   . Heart failure Father   . Sudden death Brother   . Heart attack Brother   . Hypertension Sister   . Heart murmur Sister      Social History:   reports that he has never smoked. He has never used smokeless tobacco. He reports that he does not drink alcohol and does not use drugs.  Medications  Current Facility-Administered Medications:  .  dexamethasone (DECADRON) injection 10 mg, 10 mg, Intravenous, Once, Upstill, Shari, PA-C .  prochlorperazine (COMPAZINE) injection 10 mg, 10 mg, Intravenous, Once, Upstill, Shari, PA-C  Current Outpatient Medications:  .  acetaminophen (TYLENOL) 325 MG tablet, Take 650 mg by mouth every 6 (six) hours as needed for moderate pain., Disp: , Rfl:  .  Alirocumab (PRALUENT) 150 MG/ML SOAJ, Inject 1 Dose into the skin every 14 (fourteen) days., Disp: 2 pen, Rfl: 11 .  amoxicillin-clavulanate (AUGMENTIN) 500-125 MG tablet, Take 1 tablet by mouth 3 (three) times daily., Disp: , Rfl:  .  aspirin 81 MG tablet, Take 1 tablet (81 mg total) by mouth daily., Disp: , Rfl:  .  ezetimibe (ZETIA) 10 MG tablet, TAKE 1 TABLET BY MOUTH EVERY DAY (Patient taking differently: Take 10 mg by mouth daily. ), Disp: 90 tablet, Rfl: 3 .  metoprolol succinate (TOPROL-XL) 50 MG 24 hr tablet, TAKE 1 TABLET BY MOUTH DAILY IN THE EVENING WITH OR  IMMEDIATELY FOLLOWING A MEAL, Disp: 90 tablet, Rfl: 1 .  oxyCODONE (OXY IR/ROXICODONE) 5 MG immediate release tablet, Take 1-2 tablets (5-10 mg total) by mouth every 6 (six) hours as needed for severe pain., Disp: 20 tablet, Rfl: 0 .  sitaGLIPtin (JANUVIA) 100 MG tablet, Take 100 mg by mouth daily., Disp: , Rfl:  .  tamsulosin (FLOMAX) 0.4 MG CAPS capsule, Take 0.4 mg by mouth daily., Disp: , Rfl:   Exam: Current vital signs: BP (!) 143/85   Pulse 93   Temp 98.9 F (37.2 C) (Oral)   Resp 20   Ht 5\' 9"  (1.753 m)   Wt 90.7 kg   SpO2 99%   BMI 29.53 kg/m  Vital signs in last 24 hours: Temp:  [98.9 F (37.2 C)] 98.9 F (37.2 C) (11/15 2253) Pulse Rate:  [90-101] 93 (11/15 2330) Resp:  [17-20] 20 (11/15 2330) BP: (143-164)/(85-104) 143/85 (11/15 2330) SpO2:  [98 %-99 %] 99 % (11/15 2330) Weight:  [90.7 kg] 90.7 kg (11/15 2253) GENERAL: Awake, alert in NAD, slightly anxious appearing HEENT: - Normocephalic and atraumatic, dry mm, no LN++, no Thyromegally LUNGS - Clear to auscultation bilaterally with no wheezes CV - S1S2 RRR, no m/r/g, equal pulses bilaterally. ABDOMEN - Soft, nontender, nondistended with normoactive BS Ext: warm, well perfused, intact peripheral pulses, no edema  NEURO:  Mental Status: AA&Ox3  Language: speech is nondysarthric.  Naming, repetition, fluency, and comprehension intact. Cranial Nerves: PERRL EOMI, visual fields full, no facial asymmetry, facial sensation intact, hearing intact, tongue/uvula/soft palate midline, normal sternocleidomastoid and trapezius muscle strength. No evidence of tongue atrophy or fibrillations Motor: 5/5 without drift in any of the 4 extremities Tone: is normal and bulk is normal Sensation-diminished to light touch on the left arm in comparison to the right arm.  Otherwise intact Coordination: FTN intact bilaterally, no ataxia in BLE. Gait- deferred  NIHSS-1 for subjective sensory symptoms   Labs I have reviewed labs in epic  and the results pertinent to this consultation are:  CBC    Component Value Date/Time   WBC 8.0 04/22/2020 2239   RBC 5.09 04/22/2020 2239   HGB 15.0 04/22/2020 2241   HCT 44.0 04/22/2020 2241   PLT 237 04/22/2020 2239   MCV 90.2 04/22/2020 2239   MCH 28.1 04/22/2020 2239   MCHC 31.2 04/22/2020 2239   RDW 12.6 04/22/2020 2239   LYMPHSABS 3.1 04/22/2020 2239   MONOABS 0.8 04/22/2020 2239   EOSABS 0.1 04/22/2020 2239   BASOSABS 0.0 04/22/2020 2239    CMP     Component Value Date/Time   NA 144 04/22/2020 2241   NA 139 09/15/2019 1237   K 3.4 (L) 04/22/2020 2241   CL 106 04/22/2020 2241   CO2 25 04/22/2020 2239   GLUCOSE 81 04/22/2020 2241   BUN 13 04/22/2020 2241   BUN 16  09/15/2019 1237   CREATININE 1.30 (H) 04/22/2020 2241   CREATININE 0.93 12/18/2014 1157   CALCIUM 9.0 04/22/2020 2239   PROT 7.0 04/22/2020 2239   PROT 7.0 09/15/2019 1237   ALBUMIN 4.1 04/22/2020 2239   ALBUMIN 4.1 09/15/2019 1237   AST 26 04/22/2020 2239   ALT 21 04/22/2020 2239   ALKPHOS 56 04/22/2020 2239   BILITOT 0.6 04/22/2020 2239   BILITOT 0.3 09/15/2019 1237   GFRNONAA >60 04/22/2020 2239   GFRNONAA >89 12/18/2014 1157   GFRAA >60 11/09/2019 0854   GFRAA >89 12/18/2014 1157    Lipid Panel     Component Value Date/Time   CHOL 195 09/15/2019 1237   TRIG 82 09/15/2019 1237   HDL 43 09/15/2019 1237   CHOLHDL 4.5 09/15/2019 1237   CHOLHDL 4.3 09/19/2018 0243   VLDL 13 09/19/2018 0243   LDLCALC 137 (H) 09/15/2019 1237     Imaging I have reviewed the images obtained:  CT-scan of the brain-no acute changes MRI brain-no acute changes.  No evidence of acute ischemic stroke.  Revisualized nonspecific T2 hyperintensity in the brainstem, unchanged from 2017.   Assessment: 49 year old with above past medical history presenting with sudden onset of left sided numbness feelings of jaw quivering and slurred speech.  On examination, only positive findings were subjective diminished sensation  on the left arm in comparison to the right.  Appeared to be extremely anxious at the time as well. Symptoms not very suggestive of acute stroke. Discussed risks and benefits of IV TPA given that he did have some sensory deficits on the left side, but patient was reluctant given the side effect profile.  Taken in for stat MRI which did not reveal any acute stroke. Likely complex migraine versus anxiety related symptoms.   Recommendations: Recommend migraine cocktail and IV fluids. Reevaluate the patient after the fluids and medication. If symptoms resolved, can be discharged home with outpatient neurology follow-up as needed. If the symptoms persist, please recall and discuss. Plan discussed with the ED providers at bedside. Plan also discussed with the patient and his wife at bedside. All questions answered.    -- Milon Dikes, MD [facility] Pager: [phone] If 7pm to 7am, please call on call as listed on AMION.

## 2020-04-22 NOTE — ED Provider Notes (Signed)
[facility] EMERGENCY DEPARTMENT Provider Note   CSN: [id number] Arrival date & time: 04/22/20  2227     History Chief Complaint  Patient presents with  . Left arm/numbness ; Dysarthria    Code Stroke    Danny Brewer is a 49 y.o. male.  Patient with history of CAD s/p CABG (2020), HTN, HLD, DM, kidney stones presents with left facial and left upper extremity numbness starting just prior to arrival. No weakness. No lower extremity symptoms or walking difficulty. He states he feels his jaw is tremulous "like my teeth are chattering". No chest pain, SOB, cough, recent fever, nausea. He reports headaches on and off for several days.   The history is provided by the patient. No language interpreter was used.       Past Medical History:  Diagnosis Date  . Arthritis   . Carotid US 10/15/2018   Carotid US 10/2018: bilateral ICA 1-39  . Complication of anesthesia    Pt verble and "prophecizes" after anesthesia  . Coronary Artery Disease    Coronary CTA 09/2018: pLAD 70-90; pD1+pD2 70-90; mRCA mixed plaque - cannot rule out 70-90; FFR suggests hemodynamically significant stenosis // LHC 10/2018: 2v CAD >> s/p  CABG (L-LAD, L radial-RI, S-D2, S-RCA) with Dr. Tyrone Sage   . Diabetes mellitus    type 2  . Echocardiogram 09/2018    Echo 09/2018: EF 60-65  . GERD   . History of kidney stones   . Hyperlipidemia   . Hypertension   . Inguinal hernia 2016   right  . Pneumonia   . Vision abnormalities     Patient Active Problem List   Diagnosis Date Noted  . Essential hypertension 10/31/2018  . Hyperlipidemia 10/31/2018  . Type 2 diabetes mellitus without complication, without long-term current use of insulin (HCC) 10/31/2018  . Coronary artery disease 10/17/2018  . Obstructive sleep apnea 10/15/2018  . CAD (coronary artery disease), native coronary artery 10/14/2018  . Chest pain with high risk of acute coronary syndrome 09/18/2018  . Abnormal MRI of head 03/31/2017    . Transient alteration of awareness 03/31/2017  . Snoring 03/31/2017  . Excessive daytime sleepiness 03/31/2017  . Back pain   . Paresthesias 08/26/2015  . Confusion 08/26/2015  . Sensory disturbance 08/26/2015  . Atypical chest pain 08/26/2015  . Dizziness and giddiness 06/06/2013  . Viral illness 06/06/2013  . GERD (gastroesophageal reflux disease) 06/06/2013    Past Surgical History:  Procedure Laterality Date  . CORONARY ARTERY BYPASS GRAFT N/A 10/17/2018   Procedure: CORONARY ARTERY BYPASS GRAFTING (CABG), ON PUMP, TIMES FOUR, USING LIMA TO LAD, ENDOSCOPICALLY HARVESTED GREATER SAPHENOUS VEIN TO DIAG 2, SVG TO DISTAL RCA, AND LEFT RADIAL TO RAMUS INTERMEDIUS;  Surgeon: [name], MD;  Location: [facility] OR;  Service: Open Heart Surgery;  Laterality: N/A;  . ENDOVEIN HARVEST OF GREATER SAPHENOUS VEIN Right 10/17/2018   Procedure: Mack Guise Of Greater Saphenous Vein;  Surgeon: [name], MD;  Location: [facility] OR;  Service: Open Heart Surgery;  Laterality: Right;  . HAND SURGERY Left 1987  . HERNIA REPAIR    . INGUINAL HERNIA REPAIR Bilateral 05/27/2015   Procedure: LAPAROSCOPIC BILATERAL  INGUINAL HERNIA REPAIRS WITH MESH;  Surgeon: Karie Soda, MD;  Location: [facility] ORS;  Service: General;  Laterality: Bilateral;  . KNEE SURGERY     left scope  . LEFT HEART CATH AND CORONARY ANGIOGRAPHY N/A 10/14/2018   Procedure: LEFT HEART CATH AND CORONARY ANGIOGRAPHY;  Surgeon: Katrinka Blazing,  Barry DienesHenry W, MD;  Location: [facility] INVASIVE CV LAB;  Service: Cardiovascular;  Laterality: N/A;  . RADIAL ARTERY HARVEST Left 10/17/2018   Procedure: RADIAL ARTERY HARVEST;  Surgeon: Delight OvensGerhardt, Edward B, MD;  [facility];  Service: Open Heart Surgery;  Laterality: Left;  . SHOULDER CLOSED REDUCTION Left 09/04/2019   Procedure: CLOSED MANIPULATION SHOULDER;  Surgeon: Dannielle HuhLucey, Steve, MD;  Location: [facility] ORS;  Service: Orthopedics;  Laterality: Left;  . TEE WITHOUT CARDIOVERSION N/A 10/17/2018   Procedure:  TRANSESOPHAGEAL ECHOCARDIOGRAM (TEE);  Surgeon: Delight OvensGerhardt, Edward B, MD;  [facility];  Service: Open Heart Surgery;  Laterality: N/A;       Family History  Problem Relation Age of Onset  . Hypertension Mother   . Cancer Father   . Heart failure Father   . Sudden death Brother   . Heart attack Brother   . Hypertension Sister   . Heart murmur Sister     Social History   Tobacco Use  . Smoking status: Never Smoker  . Smokeless tobacco: Never Used  Vaping Use  . Vaping Use: Never used  Substance Use Topics  . Alcohol use: No  . Drug use: No    Home Medications Prior to Admission medications   Medication Sig Start Date End Date Taking? Authorizing Provider  acetaminophen (TYLENOL) 325 MG tablet Take 650 mg by mouth every 6 (six) hours as needed for moderate pain.    [provider]  Alirocumab (PRALUENT) 150 MG/ML SOAJ Inject 1 Dose into the skin every 14 (fourteen) days. 09/15/19   Hilty, Lisette AbuKenneth C, MD  amoxicillin-clavulanate (AUGMENTIN) 500-125 MG tablet Take 1 tablet by mouth 3 (three) times daily.    [provider]  aspirin 81 MG tablet Take 1 tablet (81 mg total) by mouth daily. 10/11/18   Tereso NewcomerWeaver, Scott T, PA-C  ezetimibe (ZETIA) 10 MG tablet TAKE 1 TABLET BY MOUTH EVERY DAY Patient taking differently: Take 10 mg by mouth daily.  08/22/19   Marjie SkiffGoodrich, Callie E, PA-C  metoprolol succinate (TOPROL-XL) 50 MG 24 hr tablet TAKE 1 TABLET BY MOUTH DAILY IN THE EVENING WITH OR IMMEDIATELY FOLLOWING A MEAL 04/22/20   Weaver, Scott T, PA-C  oxyCODONE (OXY IR/ROXICODONE) 5 MG immediate release tablet Take 1-2 tablets (5-10 mg total) by mouth every 6 (six) hours as needed for severe pain. 09/04/19   Guy Sandiferobbins, Colby Alan, PA  sitaGLIPtin (JANUVIA) 100 MG tablet Take 100 mg by mouth daily.    [provider]  tamsulosin (FLOMAX) 0.4 MG CAPS capsule Take 0.4 mg by mouth daily.    [provider]    Allergies    Antihistamines, diphenhydramine-type;  Demerol; Morphine and related; and Compazine [prochlorperazine]  Review of Systems   Review of Systems  Constitutional: Negative for chills and fever.  HENT: Negative.   Respiratory: Negative.  Negative for shortness of breath.   Cardiovascular: Negative.  Negative for chest pain.  Gastrointestinal: Negative.   Musculoskeletal: Negative.   Skin: Negative.   Neurological: Positive for numbness and headaches. Negative for weakness.    Physical Exam Updated Vital Signs BP 113/79   Pulse 87   Temp 98.9 F (37.2 C) (Oral)   Resp 14   Ht 5\' 9"  (1.753 m)   Wt 90.7 kg   SpO2 98%   BMI 29.53 kg/m   Physical Exam Vitals and nursing note reviewed.  Constitutional:      General: He is not in acute distress.    Appearance: Normal appearance. He is  well-developed. He is not ill-appearing.  HENT:     Head: Normocephalic.  Eyes:     General: No visual field deficit. Cardiovascular:     Rate and Rhythm: Normal rate and regular rhythm.  Pulmonary:     Effort: Pulmonary effort is normal.     Breath sounds: Normal breath sounds.  Abdominal:     General: Bowel sounds are normal.     Palpations: Abdomen is soft.     Tenderness: There is no abdominal tenderness. There is no guarding or rebound.  Musculoskeletal:        General: Normal range of motion.     Cervical back: Normal range of motion and neck supple.  Skin:    General: Skin is warm and dry.     Findings: No rash.  Neurological:     Mental Status: He is alert and oriented to person, place, and time.     GCS: GCS eye subscore is 4. GCS verbal subscore is 5. GCS motor subscore is 6.     Cranial Nerves: No cranial nerve deficit, dysarthria or facial asymmetry.     Sensory: Sensory deficit (Left facial and left UE sensory deficit) present.     Motor: Motor function is intact. No weakness, abnormal muscle tone or pronator drift.     Coordination: Coordination is intact.     ED Results / Procedures / Treatments   Labs (all  labs ordered are listed, but only abnormal results are displayed) Labs Reviewed  COMPREHENSIVE METABOLIC PANEL - Abnormal; Notable for the following components:      Result Value   Creatinine, Ser 1.32 (*)    All other components within normal limits  I-STAT CHEM 8, ED - Abnormal; Notable for the following components:   Potassium 3.4 (*)    Creatinine, Ser 1.30 (*)    Calcium, Ion 1.10 (*)    All other components within normal limits  ETHANOL  PROTIME-INR  APTT  CBC  DIFFERENTIAL  RAPID URINE DRUG SCREEN, HOSP PERFORMED  URINALYSIS, ROUTINE W REFLEX MICROSCOPIC  CBG MONITORING, ED  TROPONIN I (HIGH SENSITIVITY)  TROPONIN I (HIGH SENSITIVITY)   Results for orders placed or performed during the hospital encounter of 04/22/20  Ethanol  Result Value Ref Range   Alcohol, Ethyl (B) <10 <10 mg/dL  Protime-INR  Result Value Ref Range   Prothrombin Time 12.5 11.4 - 15.2 seconds   INR 1.0 0.8 - 1.2  APTT  Result Value Ref Range   aPTT 29 24 - 36 seconds  CBC  Result Value Ref Range   WBC 8.0 4.0 - 10.5 K/uL   RBC 5.09 4.22 - 5.81 MIL/uL   Hemoglobin 14.3 13.0 - 17.0 g/dL   HCT 97.9 39 - 52 %   MCV 90.2 80.0 - 100.0 fL   MCH 28.1 26.0 - 34.0 pg   MCHC 31.2 30.0 - 36.0 g/dL   RDW 89.2 11.9 - 41.7 %   Platelets 237 150 - 400 K/uL   nRBC 0.0 0.0 - 0.2 %  Differential  Result Value Ref Range   Neutrophils Relative % 48 %   Neutro Abs 3.9 1.7 - 7.7 K/uL   Lymphocytes Relative 39 %   Lymphs Abs 3.1 0.7 - 4.0 K/uL   Monocytes Relative 10 %   Monocytes Absolute 0.8 0.1 - 1.0 K/uL   Eosinophils Relative 2 %   Eosinophils Absolute 0.1 0.0 - 0.5 K/uL   Basophils Relative 1 %   Basophils Absolute 0.0 0.0 -  0.1 K/uL   Immature Granulocytes 0 %   Abs Immature Granulocytes 0.01 0.00 - 0.07 K/uL  Comprehensive metabolic panel  Result Value Ref Range   Sodium 140 135 - 145 mmol/L   Potassium 3.7 3.5 - 5.1 mmol/L   Chloride 106 98 - 111 mmol/L   CO2 25 22 - 32 mmol/L   Glucose,  Bld 83 70 - 99 mg/dL   BUN 10 6 - 20 mg/dL   Creatinine, Ser 2.53 (H) 0.61 - 1.24 mg/dL   Calcium 9.0 8.9 - 66.4 mg/dL   Total Protein 7.0 6.5 - 8.1 g/dL   Albumin 4.1 3.5 - 5.0 g/dL   AST 26 15 - 41 U/L   ALT 21 0 - 44 U/L   Alkaline Phosphatase 56 38 - 126 U/L   Total Bilirubin 0.6 0.3 - 1.2 mg/dL   GFR, Estimated >40 >34 mL/min   Anion gap 9 5 - 15  I-stat chem 8, ED  Result Value Ref Range   Sodium 144 135 - 145 mmol/L   Potassium 3.4 (L) 3.5 - 5.1 mmol/L   Chloride 106 98 - 111 mmol/L   BUN 13 6 - 20 mg/dL   Creatinine, Ser 7.42 (H) 0.61 - 1.24 mg/dL   Glucose, Bld 81 70 - 99 mg/dL   Calcium, Ion 5.95 (L) 1.15 - 1.40 mmol/L   TCO2 24 22 - 32 mmol/L   Hemoglobin 15.0 13.0 - 17.0 g/dL   HCT 63.8 39 - 52 %  CBG monitoring, ED  Result Value Ref Range   Glucose-Capillary 98 70 - 99 mg/dL  Troponin I (High Sensitivity)  Result Value Ref Range   Troponin I (High Sensitivity) 4 <18 ng/L     EKG EKG Interpretation  Date/Time:  Monday April 22 2020 23:28:16 EST Ventricular Rate:  80 PR Interval:    QRS Duration: 98 QT Interval:  368 QTC Calculation: 425 R Axis:   65 Text Interpretation: Sinus rhythm RSR' in V1 or V2, right VCD or RVH No significant change since last tracing Confirmed by Gilda Crease [phone]) on 04/23/2020 3:57:45 AM   Radiology MR BRAIN WO CONTRAST  Result Date: 04/22/2020 CLINICAL DATA:  ACUTE NEUROLOGIC DEFICIT EXAM: MRI HEAD WITHOUT CONTRAST TECHNIQUE: Multiplanar, multiecho pulse sequences of the brain and surrounding structures were obtained without intravenous contrast. COMPARISON:  Brain MRI 08/26/2015 FINDINGS: Brain: No acute infarct, acute hemorrhage or extra-axial collection. Unchanged punctate focus of hyperintense T2-weighted signal in the dorsal pons. Normal volume of CSF spaces. Single focus of chronic microhemorrhage in the left temporal lobe. Normal midline structures. Vascular: Normal flow voids. Skull and upper cervical  spine: Normal marrow signal. Sinuses/Orbits: Negative. Other: None. IMPRESSION: 1. No acute intracranial abnormality. 2. Unchanged examination compared to 08/26/2015. Electronically Signed   By: Deatra Robinson M.D.   On: 04/22/2020 23:33   CT HEAD CODE STROKE WO CONTRAST  Result Date: 04/22/2020 CLINICAL DATA:  Code stroke.  Left arm numbness EXAM: CT HEAD WITHOUT CONTRAST TECHNIQUE: Contiguous axial images were obtained from the base of the skull through the vertex without intravenous contrast. COMPARISON:  None. FINDINGS: Brain: There is no mass, hemorrhage or extra-axial collection. The size and configuration of the ventricles and extra-axial CSF spaces are normal. The brain parenchyma is normal, without evidence of acute or chronic infarction. Vascular: No abnormal hyperdensity of the major intracranial arteries or dural venous sinuses. No intracranial atherosclerosis. Skull: The visualized skull base, calvarium and extracranial soft tissues are normal. Sinuses/Orbits: No fluid  levels or advanced mucosal thickening of the visualized paranasal sinuses. No mastoid or middle ear effusion. The orbits are normal. ASPECTS [facility] Stroke Program Early CT Score) - Ganglionic level infarction (caudate, lentiform nuclei, internal capsule, insula, M1-M3 cortex): 7 - Supraganglionic infarction (M4-M6 cortex): 3 Total score (0-10 with 10 being normal): 10 IMPRESSION: 1. Normal head CT. 2. ASPECTS is 10. These results were communicated to Dr. Milon Dikes at 10:50 pm on 04/22/2020 by text page via the [facility] messaging system. Electronically Signed   By: Deatra Robinson M.D.   On: 04/22/2020 22:50    Procedures Procedures (including critical care time)  Medications Ordered in ED Medications  dexamethasone (DECADRON) injection 10 mg (10 mg Intravenous Given 04/22/20 2359)  prochlorperazine (COMPAZINE) injection 10 mg (10 mg Intravenous Given 04/22/20 2359)  LORazepam (ATIVAN) 2 MG/ML injection (1 mg Intravenous  Given 04/23/20 0010)    ED Course  I have reviewed the triage vital signs and the nursing notes.  Pertinent labs & imaging results that were available during my care of the patient were reviewed by me and considered in my medical decision making (see chart for details).    MDM Rules/Calculators/A&P                          Patient to ED with left facial and LUE numbness. No weakness.   Chart reviewed. Similar presentation in 2017. MR at that time negative for acute stroke.   Patient has been seen by neurology, Dr. Wilford Corner. 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 Will provide IV Decadron, Compazine and reassess.   12:15: After he received Compazine, the patient became restless, uncomfortable, bilateral extremities, anxious. Likely reaction to Compazine. He is allergic to diphenhydramine and will Ativan. Will reassess.   After Ativan, the patient is sleeping comfortably. VSS. Troponin added with concerns that he had left jaw tightness and is 4. Unchanged EKG. Delta troponin not performed.   He ambulates without further symptoms. Discussed discharge with his wife who is comfortable with taking him home. Recommend follow up with cardiology and PCP. Return precautions discussed.        Final Clinical Impression(s) / ED Diagnoses Final diagnoses:  None   1. Paresthesias  Rx / DC Orders ED Discharge Orders    None       Elpidio Anis, PA-C 04/23/20 0402    Blane Ohara, MD 04/23/20 [phone]

## 2020-04-23 LAB — TROPONIN I (HIGH SENSITIVITY): Troponin I (High Sensitivity): 4 ng/L (ref <18)

## 2020-04-23 MED ORDER — LORAZEPAM 2 MG/ML IJ SOLN
INTRAMUSCULAR | Status: AC
  Administered 2020-04-23: 1 mg via INTRAVENOUS
  Filled 2020-04-23: qty 1

## 2020-04-23 NOTE — ED Notes (Signed)
Pt ambulated,did not need help while walking. Pt did feel light headed.

## 2020-04-23 NOTE — Code Documentation (Signed)
Responded to Code Stroke called at 2240 for L sided weakness and facial droop, [id number]. Pt arrived at 2227, CBG-98, NIH-1 for sensory deficit to L arm, CT head negative. Pt transported to MRI at 2257. MRI negative for stroke. 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

## 2020-04-23 NOTE — Discharge Instructions (Addendum)
Follow up with your cardiologist and with primary care for recheck of symptoms.   Return to the emergency department with any new or worsening symptoms at any time.

## 2020-04-26 ENCOUNTER — Telehealth: Payer: Self-pay | Admitting: Cardiovascular Disease

## 2020-04-26 NOTE — Telephone Encounter (Signed)
Patient called and stated he is making a hospital follow up visit with Dr. Excell Seltzer. Patient wants an appt before Thanksgiving. Told patient Dr. Excell Seltzer doesn't have any available. Please call

## 2020-04-26 NOTE — Telephone Encounter (Signed)
Informed the patient there are no appointments available next week, but offered him an appointment in December. He states he will schedule in December but requests a call on Monday as he does not have his calendar. Will call Monday.

## 2020-04-30 NOTE — Telephone Encounter (Signed)
Scheduled the patient 05/24/20 with Dr. Excell Seltzer. He was grateful for assistance.

## 2020-05-23 NOTE — Progress Notes (Signed)
   Covid-19 Vaccination Clinic  Name:  Rayshun Kandler    MRN: [id number] DOB: January 04, 1971  05/23/2020  Mr. Scholer was observed post Covid-19 immunization for 15 minutes without incident. He was provided with Vaccine Information Sheet and instruction to access the V-Safe system.   Mr. Sansom was instructed to call [phone] with any severe reactions post vaccine: Marland Kitchen Difficulty breathing  . Swelling of face and throat  . A fast heartbeat  . A bad rash all over body  . Dizziness and weakness   Immunizations Administered    Name Date Dose VIS Date Route   Pfizer COVID-19 Vaccine 04/05/2020  2:00 PM 0.3 mL 03/27/2020 Intramuscular   Manufacturer: [employer], [employer]   Lot: [id number]   NDC: [address]-2

## 2020-05-24 ENCOUNTER — Other Ambulatory Visit: Payer: Self-pay

## 2020-05-24 ENCOUNTER — Ambulatory Visit (INDEPENDENT_AMBULATORY_CARE_PROVIDER_SITE_OTHER): Payer: BC Managed Care – PPO | Admitting: Cardiovascular Disease

## 2020-05-24 ENCOUNTER — Encounter: Payer: Self-pay | Admitting: Cardiovascular Disease

## 2020-05-24 VITALS — BP 140/100 | HR 91 | Ht 69.0 in | Wt 198.8 lb

## 2020-05-24 DIAGNOSIS — I10 Essential (primary) hypertension: Secondary | ICD-10-CM

## 2020-05-24 DIAGNOSIS — I2510 Atherosclerotic heart disease of native coronary artery without angina pectoris: Secondary | ICD-10-CM

## 2020-05-24 DIAGNOSIS — E782 Mixed hyperlipidemia: Secondary | ICD-10-CM | POA: Diagnosis not present

## 2020-05-24 DIAGNOSIS — N529 Male erectile dysfunction, unspecified: Secondary | ICD-10-CM | POA: Diagnosis not present

## 2020-05-24 MED ORDER — TADALAFIL 5 MG PO TABS: 5.0000 mg | ORAL_TABLET | Freq: Every day | ORAL | 1 refills | Status: DC | PRN

## 2020-05-24 MED ORDER — AMLODIPINE BESYLATE 5 MG PO TABS: 5.0000 mg | ORAL_TABLET | Freq: Every day | ORAL | 3 refills | Status: DC

## 2020-05-24 NOTE — Patient Instructions (Addendum)
Medication Instructions:  1) START AMLODIPINE 5 mg daily 2) START CIALIS once daily AS NEEDED prior to sexual activity  *If you need a refill on your cardiac medications before your next appointment, please call your pharmacy*   Follow-Up: You have been scheduled in the [facility] 06/14/20 at 10:30AM. Please keep a blood pressure log and bring to this appointment.  You have an appointment with Tereso Newcomer, PA on 08/23/20 at 12:15PM.

## 2020-05-24 NOTE — Progress Notes (Signed)
Cardiology Office Note:    Date:  05/24/2020   ID:  Danny Brewer, DOB [birth year], MRN [id number]  PCP:  Ileana Ladd, MD  [facility] Cardiologist:  Chrystie Nose, MD  [facility] Electrophysiologist:  None   Referring MD: Ileana Ladd, MD   Chief Complaint  Patient presents with  . Coronary Artery Disease    History of Present Illness:    Danny Brewer is a 49 y.o. male with a hx of multivessel coronary artery disease who underwent four-vessel CABG in April 2020.  Comorbid conditions include strong family history of premature CAD, type 2 diabetes, hypertension, and mixed hyperlipidemia.  He has been unable to tolerate statin drugs because of myalgias.  He has not wanted to take PCSK9 inhibitors because of concerns about side effects.  He has undergone lipid clinic evaluation with Dr. Rennis Golden in the past.    The patient is here alone today.  He has had a lot of trouble with medication intolerances.  He did not tolerate metoprolol well at all.  Had problems with fatigue, erectile dysfunction, and several other complaints.  He feels much better off of this.  He never started Repatha because he was concerned about potential side effects.  He cannot tolerate statin drugs.  From a symptomatic perspective, he is doing well.  Having some problems with erectile dysfunction.  Otherwise no cardiovascular-related complaints at present.  No exertional chest pain or pressure, orthopnea, PND, or heart palpitations.  No leg swelling.  Past Medical History:  Diagnosis Date  . Arthritis   . Carotid US 10/15/2018   Carotid US 10/2018: bilateral ICA 1-39  . Complication of anesthesia    Pt verble and "prophecizes" after anesthesia  . Coronary Artery Disease    Coronary CTA 09/2018: pLAD 70-90; pD1+pD2 70-90; mRCA mixed plaque - cannot rule out 70-90; FFR suggests hemodynamically significant stenosis // LHC 10/2018: 2v CAD >> s/p  CABG (L-LAD, L radial-RI, S-D2, S-RCA) with Dr. Tyrone Sage   .  Diabetes mellitus    type 2  . Echocardiogram 09/2018    Echo 09/2018: EF 60-65  . GERD   . History of kidney stones   . Hyperlipidemia   . Hypertension   . Inguinal hernia 2016   right  . Pneumonia   . Vision abnormalities     Past Surgical History:  Procedure Laterality Date  . CORONARY ARTERY BYPASS GRAFT N/A 10/17/2018   Procedure: CORONARY ARTERY BYPASS GRAFTING (CABG), ON PUMP, TIMES FOUR, USING LIMA TO LAD, ENDOSCOPICALLY HARVESTED GREATER SAPHENOUS VEIN TO DIAG 2, SVG TO DISTAL RCA, AND LEFT RADIAL TO RAMUS INTERMEDIUS;  Surgeon: [name], MD;  Location: [facility] OR;  Service: Open Heart Surgery;  Laterality: N/A;  . ENDOVEIN HARVEST OF GREATER SAPHENOUS VEIN Right 10/17/2018   Procedure: Mack Guise Of Greater Saphenous Vein;  Surgeon: [name], MD;  Location[facility] OR;  Service: Open Heart Surgery;  Laterality: Right;  . HAND SURGERY Left 1987  . HERNIA REPAIR    . INGUINAL HERNIA REPAIR Bilateral 05/27/2015   Procedure: LAPAROSCOPIC BILATERAL  INGUINAL HERNIA REPAIRS WITH MESH;  Surgeon: Karie Soda, MD;  Location: [facility] ORS;  Service: General;  Laterality: Bilateral;  . KNEE SURGERY     left scope  . LEFT HEART CATH AND CORONARY ANGIOGRAPHY N/A 10/14/2018   Procedure: LEFT HEART CATH AND CORONARY ANGIOGRAPHY;  Surgeon: Lyn Records, MD;  Location: [facility] INVASIVE CV LAB;  Service: Cardiovascular;  Laterality: N/A;  . RADIAL  ARTERY HARVEST Left 10/17/2018   Procedure: RADIAL ARTERY HARVEST;  Surgeon: Delight OvensGerhardt, Edward B, MD;  [facility];  Service: Open Heart Surgery;  Laterality: Left;  . SHOULDER CLOSED REDUCTION Left 09/04/2019   Procedure: CLOSED MANIPULATION SHOULDER;  Surgeon: Dannielle HuhLucey, Steve, MD;  Location: [facility] ORS;  Service: Orthopedics;  Laterality: Left;  . TEE WITHOUT CARDIOVERSION N/A 10/17/2018   Procedure: TRANSESOPHAGEAL ECHOCARDIOGRAM (TEE);  Surgeon: Delight OvensGerhardt, Edward B, MD;  [facility];  Service: Open Heart Surgery;  Laterality: N/A;    Current  Medications: Current Meds  Medication Sig  . acetaminophen (TYLENOL) 325 MG tablet Take 650 mg by mouth every 6 (six) hours as needed for moderate pain.  Marland Kitchen. aspirin 81 MG tablet Take 1 tablet (81 mg total) by mouth daily.  Marland Kitchen. ezetimibe (ZETIA) 10 MG tablet TAKE 1 TABLET BY MOUTH EVERY DAY  . sitaGLIPtin (JANUVIA) 100 MG tablet Take 100 mg by mouth daily.  . tamsulosin (FLOMAX) 0.4 MG CAPS capsule Take 0.4 mg by mouth daily.     Allergies:   Antihistamines, diphenhydramine-type; Demerol; Morphine and related; Compazine [prochlorperazine]; and Metoprolol   Social History   Socioeconomic History  . Marital status: Married    Spouse name: Not on file  . Number of children: Not on file  . Years of education: Not on file  . Highest education level: Not on file  Occupational History  . Not on file  Tobacco Use  . Smoking status: Never Smoker  . Smokeless tobacco: Never Used  Vaping Use  . Vaping Use: Never used  Substance and Sexual Activity  . Alcohol use: No  . Drug use: No  . Sexual activity: Not Currently  Other Topics Concern  . Not on file  Social History Narrative  . Not on file   Social Determinants of Health   Financial Resource Strain: Not on file  Food Insecurity: Not on file  Transportation Needs: Not on file  Physical Activity: Not on file  Stress: Not on file  Social Connections: Not on file     Family History: The patient's family history includes Cancer in his father; Heart attack in his brother; Heart failure in his father; Heart murmur in his sister; Hypertension in his mother and sister; Sudden death in his brother.  ROS:   Please see the history of present illness.    All other systems reviewed and are negative.  EKGs/Labs/Other Studies Reviewed:    The following studies were reviewed today: Gated Coronary CTA 10/06/2018: IMPRESSION: 1. Coronary artery calcium score 448 Agatston units. This places the patient in the 98th percentile for age and  gender, suggesting high risk of future cardiac events.  2. Suspected severe proximal LAD stenosis. There also appears to be significant disease in small-moderate D1 and D2.  3. Mid RCA is obscured by motion artifact but also has significant plaque. Possible severe (70-90%) stenosis.  Will send for FFR.  Cardiac Cath 10/14/2018: Conclusion   Diabetic with family history of premature coronary atherosclerosis.  Atypical and typical symptoms.  Recurring episodes of chest tightness at rest which is mimicked by left and right coronary contrast injections.  Symptoms in arm and neck are more continuous, positional, and not likely ischemic.  Severe two-vessel coronary disease with segmental 90 to 95% proximal to mid LAD, segmental 80% large first diagonal that supplies the distribution of the ramus intermedius or circumflex, segmental 80% stenosis in the moderate-sized second diagonal that is contained within the LAD stenosis as a bifurcation  lesion, and segmental mid to distal.  The patient is right dominant.  Circumflex is small.  Normal LVEDP  RECOMMENDATIONS:   In room consultation with [facility] CV surgeon Dr. Sheliah Plane.  After discussion with patient, his wife, and surgeon, I have decided to admit the patient and he will undergo arterial grafting to the LAD, diagonal, and probable saphenous vein grafting to the right coronary on Monday.  Decided against PCI because of the requirement for long stents in all segments and anticipated decreased durability over time given his young age and diabetes.  IV nitroglycerin, therapeutic Lovenox.  Screening COVID testing.  Sleep study as OP. Clinical history is strongly suggestive.  Coronary Findings   Diagnostic Dominance: Right  Left Anterior Descending  Prox LAD to Mid LAD lesion is 95% stenosed.  First Diagonal Branch  Ost 1st Diag to 1st Diag lesion is 80% stenosed.  Second Diagonal [employer] 2nd Diag to 2nd Diag lesion is 80%  stenosed.  Left Circumflex  Vessel is small.  First Obtuse Marginal Branch  Vessel is small in size.  Second Obtuse Marginal Branch  Vessel is small in size.  Right Coronary Artery  Mid RCA to Dist RCA lesion is 85% stenosed.   Echo 09/19/2018: IMPRESSIONS    1. The left ventricle has normal systolic function, with an ejection  fraction of 60-65%. The cavity size was normal. Left ventricular diastolic  function could not be evaluated.  2. The right ventricle has normal systolc function. The cavity was  normal. There is no increase in right ventricular wall thickness.  3. Aortic valve regurgitation was not assessed by color flow Doppler.   EKG:  EKG is not ordered today.   Recent Labs: 04/22/2020: ALT 21; BUN 13; Creatinine, Ser 1.30; Hemoglobin 15.0; Platelets 237; Potassium 3.4; Sodium 144  Recent Lipid Panel    Component Value Date/Time   CHOL 195 09/15/2019 1237   TRIG 82 09/15/2019 1237   HDL 43 09/15/2019 1237   CHOLHDL 4.5 09/15/2019 1237   CHOLHDL 4.3 09/19/2018 0243   VLDL 13 09/19/2018 0243   LDLCALC 137 (H) 09/15/2019 1237     Risk Assessment/Calculations:       Physical Exam:    VS:  BP (!) 140/100   Pulse 91   Ht 5\' 9"  (1.753 m)   Wt 198 lb 12.8 oz (90.2 kg)   SpO2 98%   BMI 29.36 kg/m     Wt Readings from Last 3 Encounters:  05/24/20 198 lb 12.8 oz (90.2 kg)  04/22/20 199 lb 15.3 oz (90.7 kg)  01/01/20 (!) 209 lb (94.8 kg)     GEN:  Well nourished, well developed in no acute distress HEENT: Normal NECK: No JVD; No carotid bruits LYMPHATICS: No lymphadenopathy CARDIAC: RRR, no murmurs, rubs, gallops RESPIRATORY:  Clear to auscultation without rales, wheezing or rhonchi  ABDOMEN: Soft, non-tender, non-distended MUSCULOSKELETAL:  No edema; No deformity.  Prominent radial artery harvest scar with keloid, well-healed SKIN: Warm and dry NEUROLOGIC:  Alert and oriented x 3 PSYCHIATRIC:  Normal affect   ASSESSMENT:    1. Coronary artery  disease involving native coronary artery of native heart without angina pectoris   2. Mixed hyperlipidemia   3. Essential hypertension   4. Erectile dysfunction, unspecified erectile dysfunction type    PLAN:    In order of problems listed above:  1. Stable without recurrent angina.  Tolerating aspirin for antiplatelet therapy.  Need to work on lipid-lowering and blood pressure control.  See below.  Patient status post CABG as outlined above. 2. Last LDL cholesterol 137 in April of this year.  Talk to him about pros and cons of more aggressive lipid-lowering.  He is willing to try Repatha.  Will refer him to lipid clinic.  He cannot take statin drugs. 3. Blood pressure is uncontrolled.  Start amlodipine 5 mg daily.  He will monitor blood pressure and bring his readings in to the Pharm.D. clinic in a few weeks. 4. Trial of Cialis as needed  Medication Adjustments/Labs and Tests Ordered: Current medicines are reviewed at length with the patient today.  Concerns regarding medicines are outlined above.  No orders of the defined types were placed in this encounter.  Meds ordered this encounter  Medications  . tadalafil (CIALIS) 5 MG tablet    Sig: Take 1 tablet (5 mg total) by mouth daily as needed for erectile dysfunction.    Dispense:  10 tablet    Refill:  1  . amLODipine (NORVASC) 5 MG tablet    Sig: Take 1 tablet (5 mg total) by mouth daily.    Dispense:  90 tablet    Refill:  3    Patient Instructions  Medication Instructions:  1) START AMLODIPINE 5 mg daily 2) START CIALIS once daily AS NEEDED prior to sexual activity  *If you need a refill on your cardiac medications before your next appointment, please call your pharmacy*   Follow-Up: You have been scheduled in the LIPID CLINIC 06/14/20 at 10:30AM. Please keep a blood pressure log and bring to this appointment.  You have an appointment with Tereso Newcomer, PA on 08/23/20 at 12:15PM.    Signed, Tonny Bollman, MD   05/24/2020 4:46 PM    [facility]

## 2020-05-29 ENCOUNTER — Ambulatory Visit: Payer: BC Managed Care – PPO | Admitting: Internal Medicine

## 2020-06-08 HISTORY — PX: SHOULDER SURGERY: SHX246

## 2020-06-14 ENCOUNTER — Ambulatory Visit (INDEPENDENT_AMBULATORY_CARE_PROVIDER_SITE_OTHER): Payer: BC Managed Care – PPO | Admitting: Pharmacist

## 2020-06-14 ENCOUNTER — Other Ambulatory Visit: Payer: Self-pay

## 2020-06-14 ENCOUNTER — Other Ambulatory Visit: Payer: Self-pay | Admitting: Cardiovascular Disease

## 2020-06-14 VITALS — BP 138/98 | HR 90

## 2020-06-14 DIAGNOSIS — I10 Essential (primary) hypertension: Secondary | ICD-10-CM

## 2020-06-14 DIAGNOSIS — I2510 Atherosclerotic heart disease of native coronary artery without angina pectoris: Secondary | ICD-10-CM

## 2020-06-14 DIAGNOSIS — E7800 Pure hypercholesterolemia, unspecified: Secondary | ICD-10-CM

## 2020-06-14 MED ORDER — AMLODIPINE BESYLATE 10 MG PO TABS: 10.0000 mg | ORAL_TABLET | Freq: Every day | ORAL | 3 refills | Status: DC

## 2020-06-14 MED ORDER — EVOLOCUMAB 140 MG/ML ~~LOC~~ SOAJ: 1.0000 mL | SUBCUTANEOUS | 1 refills | Status: DC

## 2020-06-14 NOTE — Patient Instructions (Signed)
It was nice meeting you today!  We would like to keep your blood pressure less than 130/80  Please begin taking two tablets of your amlodipine 5 mg for a total of 10mg  a day  Begin checking your blood pressure about 1-2 hours after your medication  We would like your LDL cholesterol (bad cholesterol) to be less than 55  We will start a new medication you will inject once every 2 weeks and I will call you when it is approved  Please call with any questions!  , PharmD, BCACP, CDCES, CPP [facility] [address], [place], [place] Phone: [phone]; Fax: [phone] 06/14/2020 11:16 AM

## 2020-06-14 NOTE — Telephone Encounter (Signed)
Plan prefers Praluent.  Resending to pharmacy

## 2020-06-14 NOTE — Progress Notes (Signed)
Patient ID: Danny Brewer                 DOB: 07/11/1970                    MRN: [id number]     HPI: Danny Brewer is a 50 y.o. male patient referred to lipid clinic by Dr. Excell Brewer. PMH is significant for multivessel CAD s/p CABG April 2020, HTN, OSA, GERD, T2DM, and HLD. He has been unable to tolerate statin drugs because of myalgias. He has not wanted to take PCSK9 inhibitors due to concerns of side effects. He was last seen by Dr. Excell Brewer 05/24/2020. Pt had trouble with metoprolol and complained of issues with fatigue, erectile dysfunction and other issues. Pt open to trying Repatha for cholesterol lowering. At this visit, pt's blood pressure was elevated at 140/100 and pt was subsequently started on amlodipine 5 mg daily.  Pt presents today to clinic in good spirits. He is eager to discuss PCSK9 inhibitor therapy. He reports that he wants to keep healthy to stay out of the hospital. He has been taking ezetimibe 10 mg daily with no side effects. He mentions that he is 193 pounds and has a weight loss goal of 13 lbs to get down to 180 lbs.   He has not been taking his BP at home since he does not have a BP cuff. He has been taking amlodipine 5 mg daily with no side effects. He did not take his amlodipine yet today. Will take it when he gets home.   Since last clinic visit with Dr. Excell Brewer, he saw his PCP and had lab work done. Reports his A1c is 5.5%.  Has been working on hard on diet and lifestyle changes.  Current Medications:  Ezetimibe 10 mg daily  Intolerances:  Atorvastatin Rosuvastatin 10 mg daily - aches  Risk Factors: CAD s/p CABG, DM, HTN, family history  LDL goal: < 55 mg/dL  Diet: He has cut pork out, eats homemade egg mcmuffin with [place] sausage or oatmeal for breakfast. Reports switching out mayo for avocado. Rice 2x/week, doing intermittent fasting. Eats a big breakfast and then will snack on nuts, then eats dinner before 7 pm. Eating fish and chicken, no fried foods. He drinks  one 32 oz of sweet tea and much water  Exercise: Bought a bike for his wife and himself. Plans to start biking soon  Family History: The patient's family history includes Cancer in his father; Heart attack in his brother; Heart failure in his father; Heart murmur in his sister; Hypertension in his mother and sister; Sudden death in his brother.  Social History: Never smoked  Labs: 09/15/2019: TC 195, TG 82, HDL 43, LDL 137  Past Medical History:  Diagnosis Date  . Arthritis   . Carotid US 10/15/2018   Carotid US 10/2018: bilateral ICA 1-39  . Complication of anesthesia    Pt verble and "prophecizes" after anesthesia  . Coronary Artery Disease    Coronary CTA 09/2018: pLAD 70-90; pD1+pD2 70-90; mRCA mixed plaque - cannot rule out 70-90; FFR suggests hemodynamically significant stenosis // LHC 10/2018: 2v CAD >> s/p  CABG (L-LAD, L radial-RI, S-D2, S-RCA) with Dr. Tyrone Brewer   . Diabetes mellitus    type 2  . Echocardiogram 09/2018    Echo 09/2018: EF 60-65  . GERD   . History of kidney stones   . Hyperlipidemia   . Hypertension   . Inguinal hernia 2016   right  .  Pneumonia   . Vision abnormalities     Current Outpatient Medications on File Prior to Visit  Medication Sig Dispense Refill  . acetaminophen (TYLENOL) 325 MG tablet Take 650 mg by mouth every 6 (six) hours as needed for moderate pain.    Marland Kitchen amLODipine (NORVASC) 5 MG tablet Take 1 tablet (5 mg total) by mouth daily. 90 tablet 3  . aspirin 81 MG tablet Take 1 tablet (81 mg total) by mouth daily.    Marland Kitchen ezetimibe (ZETIA) 10 MG tablet TAKE 1 TABLET BY MOUTH EVERY DAY 90 tablet 3  . sitaGLIPtin (JANUVIA) 100 MG tablet Take 100 mg by mouth daily.    . tadalafil (CIALIS) 5 MG tablet Take 1 tablet (5 mg total) by mouth daily as needed for erectile dysfunction. 10 tablet 1  . tamsulosin (FLOMAX) 0.4 MG CAPS capsule Take 0.4 mg by mouth daily.     No current facility-administered medications on file prior to visit.    Allergies   Allergen Reactions  . Antihistamines, Diphenhydramine-Type Hives and Other (See Comments)    Seizures  . Demerol Hives  . Morphine And Related Hives    Esp with high dose morphine, possibly Dilaudid Tolerates hydrocodone, oxycodone  . Compazine [Prochlorperazine] Other (See Comments)    Spastic movement  . Metoprolol     Per patient caused "severe headache"    Assessment/Plan:  1. Hyperlipidemia - LDL of 137 mg/dL above goal of < 55 mg/dL. Aggressive goal chosen due to patient's recent ASCVD event in 2020, risk factors and family history. Patient with multiple statin intolerances. Pt is tolerating ezetimibe well. Continue ezetimibe 10 mg daily. Start Praluent 75 mg once every 2 weeks. F/u lipid panel in 3 months. Pt was counseled on Praluent medication administration during this visit. Encouraged patient to continue healthy diet and to begin exercising.   If insurance does not cover Praluent, start Repatha 140 mg  every 2 weeks instead. Both PCSK9 inhibitors work similarly and can be interchanged and achieve the same efficacy.  2. Hypertension - Clinic BP 138/98 mmHg above goal < 130/80 mmHg. Pt currently on amlodipine 5 mg with some improvement. Will increase to amlodipine 10 mg daily. Pt will be sent a BP cuff via mail. Encouraged pt to check BP 1-2 h after taking BP medications. F/u in clinic in 1 month and assess for tolerability and any swelling at that time.   Danny Brewer, PharmD Candidate   Danny Brewer, PharmD, BCACP, CDCES, CPP [facility] [address], [place], [place] [address] Phone: [phone]; Fax: [phone] 06/14/2020 5:14 PM

## 2020-06-17 ENCOUNTER — Telehealth: Payer: Self-pay | Admitting: Licensed Clinical Social Worker

## 2020-06-17 NOTE — Telephone Encounter (Signed)
CSW referred to assist patient with obtaining a BP cuff. CSW contacted patient to inform cuff will be delivered to home. Patient grateful for support and assistance. CSW available as needed. Jackie Jinelle Butchko, LCSW, CCSW-MCS [phone]  

## 2020-06-20 ENCOUNTER — Telehealth: Payer: Self-pay

## 2020-06-20 ENCOUNTER — Other Ambulatory Visit (HOSPITAL_COMMUNITY): Payer: Self-pay | Admitting: Infectious Diseases

## 2020-06-20 ENCOUNTER — Telehealth (HOSPITAL_COMMUNITY): Payer: Self-pay | Admitting: Pharmacist

## 2020-06-20 ENCOUNTER — Telehealth: Payer: Self-pay | Admitting: Infectious Diseases

## 2020-06-20 MED ORDER — NIRMATRELVIR/RITONAVIR (PAXLOVID)TABLET: 3.0000 | ORAL_TABLET | Freq: Two times a day (BID) | ORAL | 0 refills | Status: AC

## 2020-06-20 MED FILL — PAXLOVID 20 X 150 MG & 10 X: 20 X 150 MG | 5 days supply | Qty: 30 | Fill #0

## 2020-06-20 NOTE — Telephone Encounter (Addendum)
Outpatient Oral COVID Treatment Note  I connected with Danny Brewer on 06/20/2020/2:55 PM by telephone and verified that I am speaking with the correct person using two identifiers.  I discussed the limitations, risks, security, and privacy concerns of performing an evaluation and management service by telephone and the availability of in person appointments. I also discussed with the patient that there may be a patient responsible charge related to this service. The patient expressed understanding and agreed to proceed.  Patient location: [facility]  Provider location: Home   Diagnosis: COVID-19 infection  Purpose of visit: Discussion of potential use of Molnupiravir or Paxlovid, a new treatment for mild to moderate COVID-19 viral infection in non-hospitalized patients.   Subjective: Patient is a 50 y.o. male who has been diagnosed with COVID 19 viral infection.  Their symptoms began on 06/16/2020 with URI, fever.    Past Medical History:  Diagnosis Date  . Arthritis   . Carotid US 10/15/2018   Carotid US 10/2018: bilateral ICA 1-39  . Complication of anesthesia    Pt verble and "prophecizes" after anesthesia  . Coronary Artery Disease    Coronary CTA 09/2018: pLAD 70-90; pD1+pD2 70-90; mRCA mixed plaque - cannot rule out 70-90; FFR suggests hemodynamically significant stenosis // LHC 10/2018: 2v CAD >> s/p  CABG (L-LAD, L radial-RI, S-D2, S-RCA) with Dr. Tyrone Sage   . Diabetes mellitus    type 2  . Echocardiogram 09/2018    Echo 09/2018: EF 60-65  . GERD   . History of kidney stones   . Hyperlipidemia   . Hypertension   . Inguinal hernia 2016   right  . Pneumonia   . Vision abnormalities     Allergies  Allergen Reactions  . Antihistamines, Diphenhydramine-Type Hives and Other (See Comments)    Seizures  . Demerol Hives  . Morphine And Related Hives    Esp with high dose morphine, possibly Dilaudid Tolerates hydrocodone, oxycodone  . Compazine [Prochlorperazine] Other (See  Comments)    Spastic movement  . Metoprolol     Per patient caused "severe headache"     Current Outpatient Medications:  .  acetaminophen (TYLENOL) 325 MG tablet, Take 650 mg by mouth every 6 (six) hours as needed for moderate pain., Disp: , Rfl:  .  Alirocumab (PRALUENT) 75 MG/ML SOAJ, Inject 1 mL into the skin every 14 (fourteen) days., Disp: 6 mL, Rfl: 1 .  amLODipine (NORVASC) 10 MG tablet, Take 1 tablet (10 mg total) by mouth daily., Disp: 180 tablet, Rfl: 3 .  aspirin 81 MG tablet, Take 1 tablet (81 mg total) by mouth daily., Disp: , Rfl:  .  ezetimibe (ZETIA) 10 MG tablet, TAKE 1 TABLET BY MOUTH EVERY DAY, Disp: 90 tablet, Rfl: 3 .  sitaGLIPtin (JANUVIA) 100 MG tablet, Take 100 mg by mouth daily., Disp: , Rfl:  .  tadalafil (CIALIS) 5 MG tablet, Take 1 tablet (5 mg total) by mouth daily as needed for erectile dysfunction., Disp: 10 tablet, Rfl: 1 .  tamsulosin (FLOMAX) 0.4 MG CAPS capsule, Take 0.4 mg by mouth daily., Disp: , Rfl:   Objective: Patient appears/sounds well.  They are in no apparent distress.  Breathing is non labored.  Mood and behavior are normal.  Laboratory Data:  Recent Results (from the past 2160 hour(s))  CBG monitoring, ED     Status: None   Collection Time: 04/22/20 10:32 PM  Result Value Ref Range   Glucose-Capillary 98 70 - 99 mg/dL    Comment:  Glucose reference range applies only to samples taken after fasting for at least 8 hours.  Ethanol     Status: None   Collection Time: 04/22/20 10:39 PM  Result Value Ref Range   Alcohol, Ethyl (B) <10 <10 mg/dL    Comment: (NOTE) Lowest detectable limit for serum alcohol is 10 mg/dL.  For medical purposes only. Performed at [facility], [address] [place], [place] [address]   Protime-INR     Status: None   Collection Time: 04/22/20 10:39 PM  Result Value Ref Range   Prothrombin Time 12.5 11.4 - 15.2 seconds   INR 1.0 0.8 - 1.2    Comment: (NOTE) INR goal varies based on device and disease  states. Performed at [facility], [address] [place], [place] [address]   APTT     Status: None   Collection Time: 04/22/20 10:39 PM  Result Value Ref Range   aPTT 29 24 - 36 seconds    Comment: Performed at [facility], [address] [place], [place] [address]  CBC     Status: None   Collection Time: 04/22/20 10:39 PM  Result Value Ref Range   WBC 8.0 4.0 - 10.5 K/uL   RBC 5.09 4.22 - 5.81 MIL/uL   Hemoglobin 14.3 13.0 - 17.0 g/dL   HCT 21.345.9 08.639.0 - 57.852.0 %   MCV 90.2 80.0 - 100.0 fL   MCH 28.1 26.0 - 34.0 pg   MCHC 31.2 30.0 - 36.0 g/dL   RDW 46.912.6 62.911.5 - 52.815.5 %   Platelets 237 150 - 400 K/uL   nRBC 0.0 0.0 - 0.2 %    Comment: Performed at [facility], [address] [place], [place] [address]  Differential     Status: None   Collection Time: 04/22/20 10:39 PM  Result Value Ref Range   Neutrophils Relative % 48 %   Neutro Abs 3.9 1.7 - 7.7 K/uL   Lymphocytes Relative 39 %   Lymphs Abs 3.1 0.7 - 4.0 K/uL   Monocytes Relative 10 %   Monocytes Absolute 0.8 0.1 - 1.0 K/uL   Eosinophils Relative 2 %   Eosinophils Absolute 0.1 0.0 - 0.5 K/uL   Basophils Relative 1 %   Basophils Absolute 0.0 0.0 - 0.1 K/uL   Immature Granulocytes 0 %   Abs Immature Granulocytes 0.01 0.00 - 0.07 K/uL    Comment: Performed at [facility], [address] [place], [place] [address]  Comprehensive metabolic panel     Status: Abnormal   Collection Time: 04/22/20 10:39 PM  Result Value Ref Range   Sodium 140 135 - 145 mmol/L   Potassium 3.7 3.5 - 5.1 mmol/L   Chloride 106 98 - 111 mmol/L   CO2 25 22 - 32 mmol/L   Glucose, Bld 83 70 - 99 mg/dL    Comment: Glucose reference range applies only to samples taken after fasting for at least 8 hours.   BUN 10 6 - 20 mg/dL   Creatinine, Ser 7.251.32 (H) 0.61 - 1.24 mg/dL   Calcium 9.0 8.9 - 36.610.3 mg/dL   Total Protein 7.0 6.5 - 8.1 g/dL   Albumin 4.1 3.5 - 5.0 g/dL   AST 26 15 - 41 U/L   ALT 21 0 - 44 U/L   Alkaline  Phosphatase 56 38 - 126 U/L   Total Bilirubin 0.6 0.3 - 1.2 mg/dL   GFR, Estimated >44>60 >03>60 mL/min  Comment: (NOTE) Calculated using the CKD-EPI Creatinine Equation (2021)    Anion gap 9 5 - 15    Comment: Performed at [facility], [address] [place], [place] [address]  [place] [address], ED     Status: Abnormal   Collection Time: 04/22/20 10:41 PM  Result Value Ref Range   Sodium 144 135 - 145 mmol/L   Potassium 3.4 (L) 3.5 - 5.1 mmol/L   Chloride 106 98 - 111 mmol/L   BUN 13 6 - 20 mg/dL   Creatinine, Ser 6.06 (H) 0.61 - 1.24 mg/dL   Glucose, Bld 81 70 - 99 mg/dL    Comment: Glucose reference range applies only to samples taken after fasting for at least 8 hours.   Calcium, Ion 1.10 (L) 1.15 - 1.40 mmol/L   TCO2 24 22 - 32 mmol/L   Hemoglobin 15.0 13.0 - 17.0 g/dL   HCT 30.1 60.1 - 09.3 %  Troponin I (High Sensitivity)     Status: None   Collection Time: 04/22/20 10:42 PM  Result Value Ref Range   Troponin I (High Sensitivity) 4 <18 ng/L    Comment: (NOTE) Elevated high sensitivity troponin I (hsTnI) values and significant  changes across serial measurements may suggest ACS but many other  chronic and acute conditions are known to elevate hsTnI results.  Refer to the "Links" section for chest pain algorithms and additional  guidance. Performed at [facility], [address], [place], [place] [address]      Assessment: 50 y.o. male with mild/moderate COVID 19 viral infection diagnosed on 06/17/2020 at high risk for progression to severe COVID 19.  Plan:  This patient is a 50 y.o. male that meets the following criteria for Emergency Use Authorization of: Paxlovid 1. Age >12 yr AND > 40 kg 2. SARS-COV-2 positive test 3. Symptom onset < 5 days 4. Mild-to-moderate COVID disease with high risk for severe progression to hospitalization or death  I have spoken and communicated the following to the patient or parent/caregiver regarding: 1. Paxlovid is an  unapproved drug that is authorized for use under an Emergency Use Authorization.  2. There are no adequate, approved, available products for the treatment of COVID-19 in adults who have mild-to-moderate COVID-19 and are at high risk for progressing to severe COVID-19, including hospitalization or death. 3. Other therapeutics are currently authorized. For additional information on all products authorized for treatment or prevention of COVID-19, please see [email or web address]-[email or web address]  4. There are benefits and risks of taking this treatment as outlined in the "Fact Sheet for Patients and Caregivers."  5. "Fact Sheet for Patients and Caregivers" was reviewed with patient. A hard copy will be provided to patient from pharmacy prior to the patient receiving treatment. 6. Patients should continue to self-isolate and use infection control measures (e.g., wear mask, isolate, social distance, avoid sharing personal items, clean and disinfect "high touch" surfaces, and frequent handwashing) according to CDC guidelines.  7. The patient or parent/caregiver has the option to accept or refuse treatment. 8. Patient medication history was reviewed for potential drug interactions:Interaction with home meds: Cialis & Flomax - the patient will hold this medication while taking Paxlovid  9. Patient's creatinine clearance was calculated to be 82, and they were therefore prescribed Normal dose (CrCl>60) - nirmatrelvir 150mg  tab (2 tablet) by mouth twice daily AND ritonavir 100mg  tab (1 tablet) by mouth twice daily   After reviewing above information with the patient, the patient agrees to receive  Paxlovid.  Follow up instructions:    . Take prescription BID x 5 days as directed . Reach out to pharmacist for counseling on medication if desired . For concerns regarding further COVID symptoms please follow up with your PCP or  urgent care . For urgent or life-threatening issues, seek care at your local emergency department  The patient was provided an opportunity to ask questions, and all were answered. The patient agreed with the plan and demonstrated an understanding of the instructions.   Script sent to [facility] and opted to pick up RX.  The patient was advised to call their PCP or seek an in-person evaluation if the symptoms worsen or if the condition fails to improve as anticipated.   I provided 14 minutes of non face-to-face telephone visit time during this encounter, and > 50% was spent counseling as documented under my assessment & plan.  Rexene Alberts, NP 06/20/2020 /2:55 PM

## 2020-06-20 NOTE — Telephone Encounter (Signed)
Called to discuss with patient about COVID-19 symptoms and the use of one of the available treatments for those with mild to moderate Covid symptoms and at a high risk of hospitalization.  Pt appears to qualify for outpatient treatment due to co-morbid conditions and/or a member of an at-risk group in accordance with the FDA Emergency Use Authorization.    Symptom onset: 06/16/20 Vaccinated: Yes Booster? No Immunocompromised? No Qualifiers: Heart disease,HTN.Diabetes.  Unable to reach pt - Reached pt.   Danny Brewer

## 2020-06-20 NOTE — Telephone Encounter (Signed)
Patient was prescribed oral covid treatment paxlovid and treatment note was reviewed. Medication has been received by [facility] and reviewed for appropriateness.  Drug Interactions or Dosage Adjustments Noted: Patient is currently on tadalafil and tamsulosin, after discussing with HCP Sharman Cheek), patient was told to hold both medications for the duration of treatment with paxlovid. No renal dose adjustments were necessary, pts Scr was 82.  Delivery Method: Patient will pick up  Patient contacted for counseling on 06/19/2020 and verbalized understanding.   Delivery or Pick-Up Date: 06/19/2020   Larrie Kass 06/20/2020, 3:45 PM [facility] Out[facility] Pharmacist Phone# [phone]

## 2020-07-05 ENCOUNTER — Ambulatory Visit: Payer: Medicaid Other

## 2020-07-05 NOTE — Progress Notes (Deleted)
Patient ID: Danny Brewer                 DOB: 1970-12-27                      MRN: [id number]     HPI: Danny Brewer is a 50 y.o. male referred by Dr. Excell Seltzer to HTN/lipid  clinic. PMH is significant for CAD, HTN, and DM.  Current HTN meds: amlodipine 10mg  Currently HLD meds: zetia 10mg , Praluent 75mg  Q 14d Previously tried:  BP goal:   Family History:   Social History:   Diet:   Exercise:   Home BP readings:   Wt Readings from Last 3 Encounters:  05/24/20 198 lb 12.8 oz (90.2 kg)  04/22/20 199 lb 15.3 oz (90.7 kg)  01/01/20 (!) 209 lb (94.8 kg)   BP Readings from Last 3 Encounters:  06/14/20 (!) 138/98  05/24/20 (!) 140/100  04/23/20 128/90   Pulse Readings from Last 3 Encounters:  06/14/20 90  05/24/20 91  04/23/20 (!) 118    Renal function: CrCl cannot be calculated (Patient's most recent lab result is older than the maximum 21 days allowed.).  Past Medical History:  Diagnosis Date  . Arthritis   . Carotid 08/12/20 10/15/2018   Carotid 04/25/20 10/2018: bilateral ICA 1-39  . Complication of anesthesia    Pt verble and "prophecizes" after anesthesia  . Coronary Artery Disease    Coronary CTA 09/2018: pLAD 70-90; pD1+pD2 70-90; mRCA mixed plaque - cannot rule out 70-90; FFR suggests hemodynamically significant stenosis // LHC 10/2018: 2v CAD >> s/p  CABG (L-LAD, L radial-RI, S-D2, S-RCA) with Dr. 03-08-1992   . Diabetes mellitus    type 2  . Echocardiogram 09/2018    Echo 09/2018: EF 60-65  . GERD   . History of kidney stones   . Hyperlipidemia   . Hypertension   . Inguinal hernia 2016   right  . Pneumonia   . Vision abnormalities     Current Outpatient Medications on File Prior to Visit  Medication Sig Dispense Refill  . acetaminophen (TYLENOL) 325 MG tablet Take 650 mg by mouth every 6 (six) hours as needed for moderate pain.    . Alirocumab (PRALUENT) 75 MG/ML SOAJ Inject 1 mL into the skin every 14 (fourteen) days. (Patient not taking: Reported on 06/20/2020) 6 mL 1   . amLODipine (NORVASC) 10 MG tablet Take 1 tablet (10 mg total) by mouth daily. 180 tablet 3  . aspirin 81 MG tablet Take 1 tablet (81 mg total) by mouth daily.    10/2018 ezetimibe (ZETIA) 10 MG tablet TAKE 1 TABLET BY MOUTH EVERY DAY 90 tablet 3  . sitaGLIPtin (JANUVIA) 100 MG tablet Take 100 mg by mouth daily.    . tadalafil (CIALIS) 5 MG tablet Take 1 tablet (5 mg total) by mouth daily as needed for erectile dysfunction. (Patient not taking: Reported on 06/20/2020) 10 tablet 1  . tamsulosin (FLOMAX) 0.4 MG CAPS capsule Take 0.4 mg by mouth daily.     No current facility-administered medications on file prior to visit.    Allergies  Allergen Reactions  . Antihistamines, Diphenhydramine-Type Hives and Other (See Comments)    Seizures  . Demerol Hives  . Morphine And Related Hives    Esp with high dose morphine, possibly Dilaudid Tolerates hydrocodone, oxycodone  . Compazine [Prochlorperazine] Other (See Comments)    Spastic movement  . Metoprolol     Per patient caused "severe headache"  Assessment/Plan:  1. Hypertension -

## 2020-07-17 ENCOUNTER — Other Ambulatory Visit: Payer: Self-pay | Admitting: Urology

## 2020-07-17 ENCOUNTER — Telehealth: Payer: Self-pay | Admitting: Cardiovascular Disease

## 2020-07-17 NOTE — Telephone Encounter (Signed)
   [facility] Pre-operative Risk Assessment    HEARTCARE STAFF: - Please ensure there is not already an duplicate clearance open for this procedure. - Under Visit Info/Reason for Call, type in Other and utilize the format Clearance MM/DD/YY or Clearance TBD. Do not use dashes or single digits. - If request is for dental extraction, please clarify the # of teeth to be extracted.  Request for surgical clearance:  1. What type of surgery is being performed? Removing and replace a penile prosthesis   2. When is this surgery scheduled? 10-03-20   3. What type of clearance is required (medical clearance vs. Pharmacy clearance to hold med vs. Both)? both  4. Are there any medications that need to be held prior to surgery and how long? Aspirin 5 days prior to surgery   5. Practice name and name of physician performing surgery? Dr Franchot Gallo   6. What is the office phone number? [phone]   7.   What is the office fax number? [phone]  8.   Anesthesia type (None, local, MAC, general) ? general   Danny Brewer 07/17/2020, 11:38 AM  _________________________________________________________________   (provider comments below)

## 2020-07-17 NOTE — Telephone Encounter (Signed)
   Primary Cardiologist: Chrystie Nose, MD  Chart reviewed and patient contacted today by phone as part of pre-operative protocol coverage. Given past medical history and time since last visit, based on ACC/AHA guidelines, Danny Brewer would be at acceptable risk for the planned procedure without further cardiovascular testing.   OK to hold aspirin 5 days pre op and resume as soon as safe post op.  The patient was advised that if he develops new symptoms prior to surgery to contact our office to arrange for a follow-up visit, and he verbalized understanding.  I will route this recommendation to the requesting party via Epic fax function and remove from pre-op pool.  Please call with questions.  Corine Shelter, PA-C 07/17/2020, 1:02 PM

## 2020-07-23 ENCOUNTER — Ambulatory Visit: Payer: BC Managed Care – PPO | Admitting: Neurology

## 2020-07-30 ENCOUNTER — Encounter (HOSPITAL_COMMUNITY): Payer: Self-pay | Admitting: Emergency Medicine

## 2020-07-30 ENCOUNTER — Emergency Department (HOSPITAL_COMMUNITY)
Admission: EM | Admit: 2020-07-30 | Discharge: 2020-07-30 | Disposition: A | Discharge status: Home / Self Care | Payer: BC Managed Care – PPO | Attending: Emergency Medicine | Admitting: Emergency Medicine

## 2020-07-30 ENCOUNTER — Other Ambulatory Visit: Payer: Self-pay

## 2020-07-30 DIAGNOSIS — Z79899 Other long term (current) drug therapy: Secondary | ICD-10-CM | POA: Insufficient documentation

## 2020-07-30 DIAGNOSIS — Z7982 Long term (current) use of aspirin: Secondary | ICD-10-CM | POA: Diagnosis not present

## 2020-07-30 DIAGNOSIS — I2510 Atherosclerotic heart disease of native coronary artery without angina pectoris: Secondary | ICD-10-CM | POA: Diagnosis not present

## 2020-07-30 DIAGNOSIS — E785 Hyperlipidemia, unspecified: Secondary | ICD-10-CM | POA: Insufficient documentation

## 2020-07-30 DIAGNOSIS — E1169 Type 2 diabetes mellitus with other specified complication: Secondary | ICD-10-CM | POA: Diagnosis not present

## 2020-07-30 DIAGNOSIS — Z951 Presence of aortocoronary bypass graft: Secondary | ICD-10-CM | POA: Insufficient documentation

## 2020-07-30 DIAGNOSIS — M545 Low back pain, unspecified: Secondary | ICD-10-CM | POA: Diagnosis not present

## 2020-07-30 DIAGNOSIS — I10 Essential (primary) hypertension: Secondary | ICD-10-CM | POA: Diagnosis not present

## 2020-07-30 DIAGNOSIS — Z7984 Long term (current) use of oral hypoglycemic drugs: Secondary | ICD-10-CM | POA: Diagnosis not present

## 2020-07-30 MED ORDER — DIAZEPAM 5 MG PO TABS
5.0000 mg | ORAL_TABLET | Freq: Once | ORAL | Status: AC
  Administered 2020-07-30: 5 mg via ORAL
  Filled 2020-07-30: qty 1

## 2020-07-30 MED ORDER — DIAZEPAM 5 MG PO TABS: 5.0000 mg | ORAL_TABLET | Freq: Four times a day (QID) | ORAL | 0 refills | Status: DC | PRN

## 2020-07-30 MED ORDER — KETOROLAC TROMETHAMINE 15 MG/ML IJ SOLN
15.0000 mg | Freq: Once | INTRAMUSCULAR | Status: AC
  Administered 2020-07-30: 15 mg via INTRAMUSCULAR
  Filled 2020-07-30: qty 1

## 2020-07-30 MED ORDER — OXYCODONE HCL 5 MG PO TABS
5.0000 mg | ORAL_TABLET | Freq: Once | ORAL | Status: AC
  Administered 2020-07-30: 5 mg via ORAL
  Filled 2020-07-30: qty 1

## 2020-07-30 MED ORDER — ACETAMINOPHEN 500 MG PO TABS
1000.0000 mg | ORAL_TABLET | Freq: Once | ORAL | Status: AC
  Administered 2020-07-30: 1000 mg via ORAL
  Filled 2020-07-30: qty 2

## 2020-07-30 NOTE — ED Provider Notes (Signed)
[facility] EMERGENCY DEPARTMENT Provider Note   CSN: [id number] Arrival date & time: 07/30/20  0820     History Chief Complaint  Patient presents with  . Back Pain    Danny Brewer is a 50 y.o. male.  50 yo M with a chief complaints of left-sided low back pain.  This been going on for a few weeks now.  Has seen his orthopedist who prescribed him Robaxin and Mobic.  Patient has not had any improvement and actually feels like it is gotten worse.  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  He has been doing a lot of stripping and waxing the floors.  He denies any traumatic injury.  Denies recent surgery or injection to the spine.  He denies loss of bowel or bladder denies loss of sensation denies numbness or weakness to the leg.  Pain is localized to the left low back.  Comes in spasms.  Worse with sitting still.  He denies history of cancer.  He knows an orthopedic spinal surgeon and they are trying to get him in the office.  The history is provided by the patient.  Illness Severity:  Moderate Onset quality:  Gradual Duration:  2 weeks Timing:  Constant Progression:  Worsening Chronicity:  New Associated symptoms: myalgias   Associated symptoms: no abdominal pain, no chest pain, no congestion, no diarrhea, no fever, no headaches, no rash, no shortness of breath and no vomiting        Past Medical History:  Diagnosis Date  . Arthritis   . Carotid US 10/15/2018   Carotid US 10/2018: bilateral ICA 1-39  . Complication of anesthesia    Pt verble and "prophecizes" after anesthesia  . Coronary Artery Disease    Coronary CTA 09/2018: pLAD 70-90; pD1+pD2 70-90; mRCA mixed plaque - cannot rule out 70-90; FFR suggests hemodynamically significant stenosis // LHC 10/2018: 2v CAD >> s/p  CABG (L-LAD, L radial-RI, S-D2, S-RCA) with Dr. Tyrone Sage   . Diabetes mellitus    type 2  . Echocardiogram 09/2018    Echo 09/2018: EF 60-65  . GERD    . History of kidney stones   . Hyperlipidemia   . Hypertension   . Inguinal hernia 2016   right  . Pneumonia   . Vision abnormalities     Patient Active Problem List   Diagnosis Date Noted  . Essential hypertension 10/31/2018  . Hyperlipidemia 10/31/2018  . Type 2 diabetes mellitus without complication, without long-term current use of insulin (HCC) 10/31/2018  . Coronary artery disease 10/17/2018  . Obstructive sleep apnea 10/15/2018  . CAD (coronary artery disease), native coronary artery 10/14/2018  . Chest pain with high risk of acute coronary syndrome 09/18/2018  . Abnormal MRI of head 03/31/2017  . Transient alteration of awareness 03/31/2017  . Snoring 03/31/2017  . Excessive daytime sleepiness 03/31/2017  . Back pain   . Paresthesias 08/26/2015  . Confusion 08/26/2015  . Sensory disturbance 08/26/2015  . Atypical chest pain 08/26/2015  . Dizziness and giddiness 06/06/2013  . Viral illness 06/06/2013  . GERD (gastroesophageal reflux disease) 06/06/2013    Past Surgical History:  Procedure Laterality Date  . CORONARY ARTERY BYPASS GRAFT N/A 10/17/2018   Procedure: CORONARY ARTERY BYPASS GRAFTING (CABG), ON PUMP, TIMES FOUR, USING LIMA TO LAD, ENDOSCOPICALLY HARVESTED GREATER SAPHENOUS VEIN TO DIAG 2, SVG TO DISTAL RCA, AND LEFT RADIAL TO RAMUS INTERMEDIUS;  Surgeon: [name], MD;  Location: [facility]  OR;  Service: Open Heart Surgery;  Laterality: N/A;  . ENDOVEIN HARVEST OF GREATER SAPHENOUS VEIN Right 10/17/2018   Procedure: Mack GuiseEndovein Harvest Of Greater Saphenous Vein;  Surgeon: Delight OvensGerhardt, Edward B, MD;  [facility];  Service: Open Heart Surgery;  Laterality: Right;  . HAND SURGERY Left 1987  . HERNIA REPAIR    . INGUINAL HERNIA REPAIR Bilateral 05/27/2015   Procedure: LAPAROSCOPIC BILATERAL  INGUINAL HERNIA REPAIRS WITH MESH;  Surgeon: Karie SodaSteven Gross, MD;  Location: [facility] ORS;  Service: General;  Laterality: Bilateral;  . KNEE SURGERY     left scope  . LEFT HEART  CATH AND CORONARY ANGIOGRAPHY N/A 10/14/2018   Procedure: LEFT HEART CATH AND CORONARY ANGIOGRAPHY;  Surgeon: Lyn RecordsSmith, Henry W, MD;  Location: [facility] INVASIVE CV LAB;  Service: Cardiovascular;  Laterality: N/A;  . RADIAL ARTERY HARVEST Left 10/17/2018   Procedure: RADIAL ARTERY HARVEST;  Surgeon: Delight OvensGerhardt, Edward B, MD;  [facility];  Service: Open Heart Surgery;  Laterality: Left;  . SHOULDER CLOSED REDUCTION Left 09/04/2019   Procedure: CLOSED MANIPULATION SHOULDER;  Surgeon: Dannielle HuhLucey, Steve, MD;  Location: [facility] ORS;  Service: Orthopedics;  Laterality: Left;  . TEE WITHOUT CARDIOVERSION N/A 10/17/2018   Procedure: TRANSESOPHAGEAL ECHOCARDIOGRAM (TEE);  Surgeon: Delight OvensGerhardt, Edward B, MD;  [facility];  Service: Open Heart Surgery;  Laterality: N/A;       Family History  Problem Relation Age of Onset  . Hypertension Mother   . Cancer Father   . Heart failure Father   . Sudden death Brother   . Heart attack Brother   . Hypertension Sister   . Heart murmur Sister     Social History   Tobacco Use  . Smoking status: Never Smoker  . Smokeless tobacco: Never Used  Vaping Use  . Vaping Use: Never used  Substance Use Topics  . Alcohol use: No  . Drug use: No    Home Medications Prior to Admission medications   Medication Sig Start Date End Date Taking? Authorizing Provider  diazepam (VALIUM) 5 MG tablet Take 1 tablet (5 mg total) by mouth every 6 (six) hours as needed for muscle spasms (spasms). 07/30/20  Yes Melene PlanFloyd, Carter Kassel, DO  acetaminophen (TYLENOL) 325 MG tablet Take 650 mg by mouth every 6 (six) hours as needed for moderate pain.    [provider]  Alirocumab (PRALUENT) 75 MG/ML SOAJ Inject 1 mL into the skin every 14 (fourteen) days. Patient not taking: Reported on 06/20/2020 06/14/20 09/06/20  Tonny Bollmanooper, Michael, MD  amLODipine (NORVASC) 10 MG tablet Take 1 tablet (10 mg total) by mouth daily. 06/14/20 09/12/20  Tonny Bollmanooper, Michael, MD  aspirin 81 MG tablet Take 1 tablet (81 mg total) by mouth  daily. 10/11/18   Tereso NewcomerWeaver, Scott T, PA-C  ezetimibe (ZETIA) 10 MG tablet TAKE 1 TABLET BY MOUTH EVERY DAY 08/22/19   Marjie SkiffGoodrich, Callie E, PA-C  sitaGLIPtin (JANUVIA) 100 MG tablet Take 100 mg by mouth daily.    [provider]  tadalafil (CIALIS) 5 MG tablet Take 1 tablet (5 mg total) by mouth daily as needed for erectile dysfunction. Patient not taking: Reported on 06/20/2020 05/24/20   Tonny Bollmanooper, Michael, MD  tamsulosin (FLOMAX) 0.4 MG CAPS capsule Take 0.4 mg by mouth daily.    [provider]    Allergies    Antihistamines, diphenhydramine-type; Demerol; Morphine and related; Compazine [prochlorperazine]; and Metoprolol  Review of Systems   Review of Systems  Constitutional: Negative for chills and fever.  HENT: Negative for congestion  and facial swelling.   Eyes: Negative for discharge and visual disturbance.  Respiratory: Negative for shortness of breath.   Cardiovascular: Negative for chest pain and palpitations.  Gastrointestinal: Negative for abdominal pain, diarrhea and vomiting.  Musculoskeletal: Positive for arthralgias and myalgias.  Skin: Negative for color change and rash.  Neurological: Negative for tremors, syncope and headaches.  Psychiatric/Behavioral: Negative for confusion and dysphoric mood.    Physical Exam Updated Vital Signs BP (!) 133/95 (BP Location: Right Arm)   Pulse 89   Temp 98.9 F (37.2 C) (Oral)   Resp 16   SpO2 100%   Physical Exam Vitals and nursing note reviewed.  Constitutional:      Appearance: He is well-developed and well-nourished.  HENT:     Head: Normocephalic and atraumatic.  Eyes:     Extraocular Movements: EOM normal.     Pupils: Pupils are equal, round, and reactive to light.  Neck:     Vascular: No JVD.  Cardiovascular:     Rate and Rhythm: Normal rate and regular rhythm.     Heart sounds: No murmur heard. No friction rub. No gallop.   Pulmonary:     Effort: No respiratory distress.     Breath sounds: No  wheezing.  Abdominal:     General: There is no distension.     Tenderness: There is no guarding or rebound.  Musculoskeletal:        General: Tenderness present. Normal range of motion.     Cervical back: Normal range of motion and neck supple.     Comments: Tenderness to the left SI joint area.  No midline tenderness.  No step-offs or deformities.  Pulse motor and sensation intact to bilateral lower extremities.  Reflexes are 2+ and equal bilaterally.  No clonus.  Negative Babinski.  Negative straight leg raise test bilaterally.  Skin:    Coloration: Skin is not pale.     Findings: No rash.  Neurological:     Mental Status: He is alert and oriented to person, place, and time.  Psychiatric:        Mood and Affect: Mood and affect normal.        Behavior: Behavior normal.     ED Results / Procedures / Treatments   Labs (all labs ordered are listed, but only abnormal results are displayed) Labs Reviewed - No data to display  EKG None  Radiology No results found.  Procedures Procedures   Medications Ordered in ED Medications  acetaminophen (TYLENOL) tablet 1,000 mg (has no administration in time range)  ketorolac (TORADOL) 15 MG/ML injection 15 mg (has no administration in time range)  oxyCODONE (Oxy IR/ROXICODONE) immediate release tablet 5 mg (has no administration in time range)  diazepam (VALIUM) tablet 5 mg (has no administration in time range)    ED Course  I have reviewed the triage vital signs and the nursing notes.  Pertinent labs & imaging results that were available during my care of the patient were reviewed by me and considered in my medical decision making (see chart for details).    MDM Rules/Calculators/A&P                          50 yo M with left-sided low back pain.  Likely this is musculoskeletal by history.  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  Suspect this is  likely the cause of his pain.  He has no history of trauma no recent spinal injection.  No cauda equina symptoms.  I feel like imaging would not help him at this time.  He has been trying Robaxin without improvement.  We will try a short course of Valium.  Have him follow-up with his family doctor.  10:46 AM:  I have discussed the diagnosis/risks/treatment options with the patient and family and believe the pt to be eligible for discharge home to follow-up with PCP, ortho. We also discussed returning to the ED immediately if new or worsening sx occur. We discussed the sx which are most concerning (e.g., sudden worsening pain, fever, inability to tolerate by mouth cauda equina s/sx) that necessitate immediate return. Medications administered to the patient during their visit and any new prescriptions provided to the patient are listed below.  Medications given during this visit Medications  acetaminophen (TYLENOL) tablet 1,000 mg (has no administration in time range)  ketorolac (TORADOL) 15 MG/ML injection 15 mg (has no administration in time range)  oxyCODONE (Oxy IR/ROXICODONE) immediate release tablet 5 mg (has no administration in time range)  diazepam (VALIUM) tablet 5 mg (has no administration in time range)     The patient appears reasonably screen and/or stabilized for discharge and I doubt any other medical condition or other [facility] requiring further screening, evaluation, or treatment in the ED at this time prior to discharge.   Final Clinical Impression(s) / ED Diagnoses Final diagnoses:  Acute left-sided low back pain without sciatica    Rx / DC Orders ED Discharge Orders         Ordered    diazepam (VALIUM) 5 MG tablet  Every 6 hours PRN        07/30/20 1042           Melene Plan, DO 07/30/20 1046

## 2020-07-30 NOTE — Discharge Instructions (Addendum)
Your back pain is most likely due to a muscular strain.  There is been a lot of research on back pain, unfortunately the only thing that seems to really help is Tylenol and ibuprofen.  Relative rest is also important to not lift greater than 10 pounds bending or twisting at the waist.  Please follow-up with your family physician.  The other thing that really seems to benefit patients is physical therapy which your doctor may send you for.  Please return to the emergency department for new numbness or weakness to your arms or legs. Difficulty with urinating or urinating or pooping on yourself.  Also if you cannot feel toilet paper when you wipe or get a fever.   Please stop the 2 medications you were prescribed by your orthopedist.  Try the muscle relaxants as well as:  Take 4 over the counter ibuprofen tablets 3 times a day or 2 over-the-counter naproxen tablets twice a day for pain. Also take tylenol 1000mg (2 extra strength) four times a day.

## 2020-07-30 NOTE — ED Triage Notes (Signed)
C/o lower back pain since Thursday.  Seen by his Dr and states he is taking medication but unsure what it is.  Pain worse with sitting.  States it feels like back "locks up".  No known injury.

## 2020-07-30 NOTE — ED Notes (Signed)
Patient verbalizes understanding of discharge instructions. Opportunity for questioning and answers were provided. Armband removed by staff, pt discharged from ED.  

## 2020-08-02 ENCOUNTER — Other Ambulatory Visit: Payer: Self-pay | Admitting: Orthopedic Surgery

## 2020-08-02 DIAGNOSIS — M545 Low back pain, unspecified: Secondary | ICD-10-CM

## 2020-08-22 ENCOUNTER — Encounter: Payer: Self-pay | Admitting: Cardiology

## 2020-08-22 ENCOUNTER — Other Ambulatory Visit: Payer: Self-pay

## 2020-08-22 ENCOUNTER — Ambulatory Visit (INDEPENDENT_AMBULATORY_CARE_PROVIDER_SITE_OTHER): Payer: BC Managed Care – PPO | Admitting: Cardiology

## 2020-08-22 VITALS — BP 122/72 | HR 94 | Ht 69.0 in | Wt 190.2 lb

## 2020-08-22 DIAGNOSIS — I10 Essential (primary) hypertension: Secondary | ICD-10-CM | POA: Diagnosis not present

## 2020-08-22 DIAGNOSIS — Z01810 Encounter for preprocedural cardiovascular examination: Secondary | ICD-10-CM

## 2020-08-22 DIAGNOSIS — E118 Type 2 diabetes mellitus with unspecified complications: Secondary | ICD-10-CM

## 2020-08-22 DIAGNOSIS — Z951 Presence of aortocoronary bypass graft: Secondary | ICD-10-CM | POA: Diagnosis not present

## 2020-08-22 DIAGNOSIS — I2510 Atherosclerotic heart disease of native coronary artery without angina pectoris: Secondary | ICD-10-CM

## 2020-08-22 DIAGNOSIS — N529 Male erectile dysfunction, unspecified: Secondary | ICD-10-CM

## 2020-08-22 DIAGNOSIS — E7841 Elevated Lipoprotein(a): Secondary | ICD-10-CM

## 2020-08-22 DIAGNOSIS — E785 Hyperlipidemia, unspecified: Secondary | ICD-10-CM

## 2020-08-22 MED ORDER — AMLODIPINE BESYLATE 5 MG PO TABS: 5.0000 mg | ORAL_TABLET | Freq: Every day | ORAL | 3 refills | Status: DC

## 2020-08-22 NOTE — Progress Notes (Signed)
Cardiology Office Note   Date:  08/22/2020   ID:  Danny Brewer, DOB 04/01/1971, MRN [id number]  PCP:  Ileana Ladd, MD  Cardiologist: Dr. Excell Seltzer, MD   Chief Complaint  Patient presents with  . Follow-up  . Pre-op Exam    History of Present Illness: Danny Brewer is a 50 y.o. male who presents for preoperative clearance.   Mr. 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   He is followed by Dr. Excell Seltzer for his cardiology care. He has known intolerance to statin drugs because of myalgias and has deferred PCSK9 inhibitors because of concerns about side effects. He was referred to the lipid clinic with Dr. Rennis Golden in the past.    He was last seen by Dr. Excell Seltzer 05/24/2020 in follow up and continued to have issues with medication intolerances. He did not tolerate metoprolol and was eventually take off with symptom improvement. Otherwise he was doing well from a CV standpoint with no anginal symptoms.   Today he is here for preoperative exam.  He has been doing very well from a CV standpoint without anginal symptoms, shortness of breath, PND, LE edema, orthopnea, PND, palpitations or syncope.  He reports that since being seen at the lipid clinic, he has made significant lifestyle changes including doing intermittent fasting.  In looking through his records, he has lost approximately 20 pounds over the last year.  He states that his hemoglobin A1c is now within normal range.  His BP is very well controlled today at 122/72 off of his amlodipine.  He reports he ran out approximately 2 weeks ago with stable BPs.  He is active at baseline and can perform greater than 4 METS without anginal symptoms.The patient does not have any unstable cardiac conditions. According to [facility] and AHA guidelines, he requires no further cardiac workup prior to his noncardiac surgery and should be at acceptable risk. He may hold ASA for 5  days prior to his procedure and resume once stable from a bleeding standpoint. Due to his personal and family hx of CAD, he is asking to perform carotid doppler studies>>he is without symptoms of syncope or dizziness. There is no bruit on exam.   Past Medical History:  Diagnosis Date  . Arthritis   . Carotid US 10/15/2018   Carotid US 10/2018: bilateral ICA 1-39  . Complication of anesthesia    Pt verble and "prophecizes" after anesthesia  . Coronary Artery Disease    Coronary CTA 09/2018: pLAD 70-90; pD1+pD2 70-90; mRCA mixed plaque - cannot rule out 70-90; FFR suggests hemodynamically significant stenosis // LHC 10/2018: 2v CAD >> s/p  CABG (L-LAD, L radial-RI, S-D2, S-RCA) with Dr. Tyrone Sage   . Diabetes mellitus    type 2  . Echocardiogram 09/2018    Echo 09/2018: EF 60-65  . GERD   . History of kidney stones   . Hyperlipidemia   . Hypertension   . Inguinal hernia 2016   right  . Pneumonia   . Vision abnormalities     Past Surgical History:  Procedure Laterality Date  . CORONARY ARTERY BYPASS GRAFT N/A 10/17/2018   Procedure: CORONARY ARTERY BYPASS GRAFTING (CABG), ON PUMP, TIMES FOUR, USING LIMA TO LAD, ENDOSCOPICALLY HARVESTED GREATER SAPHENOUS VEIN TO DIAG 2, SVG TO DISTAL RCA, AND LEFT RADIAL TO RAMUS INTERMEDIUS;  Surgeon: [name], MD;  Location: [facility] OR;  Service:  Open Heart Surgery;  Laterality: N/A;  . ENDOVEIN HARVEST OF GREATER SAPHENOUS VEIN Right 10/17/2018   Procedure: Mack Guise Of Greater Saphenous Vein;  Surgeon: [name], MD;  Location: [facility] OR;  Service: Open Heart Surgery;  Laterality: Right;  . HAND SURGERY Left 1987  . HERNIA REPAIR    . INGUINAL HERNIA REPAIR Bilateral 05/27/2015   Procedure: LAPAROSCOPIC BILATERAL  INGUINAL HERNIA REPAIRS WITH MESH;  Surgeon: Karie Soda, MD;  Location: [facility] ORS;  Service: General;  Laterality: Bilateral;  . KNEE SURGERY     left scope  . LEFT HEART CATH AND CORONARY ANGIOGRAPHY N/A 10/14/2018   Procedure:  LEFT HEART CATH AND CORONARY ANGIOGRAPHY;  Surgeon: Lyn Records, MD;  Location: [facility] INVASIVE CV LAB;  Service: Cardiovascular;  Laterality: N/A;  . RADIAL ARTERY HARVEST Left 10/17/2018   Procedure: RADIAL ARTERY HARVEST;  Surgeon: [name], MD;  Location: [facility] OR;  Service: Open Heart Surgery;  Laterality: Left;  . SHOULDER CLOSED REDUCTION Left 09/04/2019   Procedure: CLOSED MANIPULATION SHOULDER;  Surgeon: Dannielle Huh, MD;  Location: [facility] ORS;  Service: Orthopedics;  Laterality: Left;  . TEE WITHOUT CARDIOVERSION N/A 10/17/2018   Procedure: TRANSESOPHAGEAL ECHOCARDIOGRAM (TEE);  Surgeon: [name], MD;  Location: [facility] OR;  Service: Open Heart Surgery;  Laterality: N/A;     Current Outpatient Medications  Medication Sig Dispense Refill  . acetaminophen (TYLENOL) 325 MG tablet Take 650 mg by mouth every 6 (six) hours as needed for moderate pain.    . Alirocumab (PRALUENT) 75 MG/ML SOAJ Inject 1 mL into the skin every 14 (fourteen) days. 6 mL 1  . amLODipine (NORVASC) 5 MG tablet Take 1 tablet (5 mg total) by mouth daily. 90 tablet 3  . aspirin 81 MG tablet Take 1 tablet (81 mg total) by mouth daily.    . diazepam (VALIUM) 5 MG tablet Take 1 tablet (5 mg total) by mouth every 6 (six) hours as needed for muscle spasms (spasms). 10 tablet 0  . ezetimibe (ZETIA) 10 MG tablet TAKE 1 TABLET BY MOUTH EVERY DAY 90 tablet 3  . sitaGLIPtin (JANUVIA) 100 MG tablet Take 100 mg by mouth daily.    . tadalafil (CIALIS) 5 MG tablet Take 1 tablet (5 mg total) by mouth daily as needed for erectile dysfunction. 10 tablet 1  . tamsulosin (FLOMAX) 0.4 MG CAPS capsule Take 0.4 mg by mouth daily.     No current facility-administered medications for this visit.    Allergies:   Antihistamines, diphenhydramine-type; Demerol; Morphine and related; Compazine [prochlorperazine]; and Metoprolol    Social History:  The patient  reports that he has never smoked. He has never used smokeless tobacco. He  reports that he does not drink alcohol and does not use drugs.   Family History:  The patient's family history includes Cancer in his father; Heart attack in his brother; Heart failure in his father; Heart murmur in his sister; Hypertension in his mother and sister; Sudden death in his brother.    ROS:  Please see the history of present illness.   Otherwise, review of systems are positive for none. All other systems are reviewed and negative.    PHYSICAL EXAM: VS:  BP 122/72   Pulse 94   Ht 5\' 9"  (1.753 m)   Wt 190 lb 3.2 oz (86.3 kg)   SpO2 97%   BMI 28.09 kg/m  , BMI Body mass index is 28.09 kg/m.   General: Well developed, well nourished,  NAD Neck: Negative for carotid bruits. No JVD Lungs:Clear to ausculation bilaterally. No wheezes, rales, or rhonchi. Breathing is unlabored. Cardiovascular: RRR with S1 S2. No murmurs Extremities: No edema.  Neuro: Alert and oriented. Psych: Responds to questions appropriately with normal affect.    EKG:  EKG is not ordered today.   Recent Labs: 04/22/2020: ALT 21; BUN 13; Creatinine, Ser 1.30; Hemoglobin 15.0; Platelets 237; Potassium 3.4; Sodium 144    Lipid Panel    Component Value Date/Time   CHOL 195 09/15/2019 1237   TRIG 82 09/15/2019 1237   HDL 43 09/15/2019 1237   CHOLHDL 4.5 09/15/2019 1237   CHOLHDL 4.3 09/19/2018 0243   VLDL 13 09/19/2018 0243   LDLCALC 137 (H) 09/15/2019 1237     Wt Readings from Last 3 Encounters:  08/22/20 190 lb 3.2 oz (86.3 kg)  05/24/20 198 lb 12.8 oz (90.2 kg)  04/22/20 199 lb 15.3 oz (90.7 kg)    Other studies Reviewed: Additional studies/ records that were reviewed today include:  Review of the above records demonstrates:   Gated Coronary CTA 10/06/2018: IMPRESSION: 1. Coronary artery calcium score 448 Agatston units. This places the patient in the 98th percentile for age and gender, suggesting high risk of future cardiac events.  2. Suspected severe proximal LAD stenosis. There  also appears to be significant disease in small-moderate D1 and D2.  3. Mid RCA is obscured by motion artifact but also has significant plaque. Possible severe (70-90%) stenosis.  Will send for FFR.  Cardiac Cath 10/14/2018: Conclusion   Diabetic with family history of premature coronary atherosclerosis. Atypical and typical symptoms. Recurring episodes of chest tightness at rest which is mimicked by left and right coronary contrast injections. Symptoms in arm and neck are more continuous, positional, and not likely ischemic.  Severe two-vessel coronary disease with segmental 90 to 95% proximal to mid LAD, segmental 80% large first diagonal that supplies the distribution of the ramus intermedius or circumflex, segmental 80% stenosis in the moderate-sized second diagonal that is contained within the LAD stenosis as a bifurcation lesion, and segmental mid to distal. The patient is right dominant. Circumflex is small.  Normal LVEDP  RECOMMENDATIONS:   In room consultation with [facility] CV surgeon Dr. Sheliah Plane.  After discussion with patient, his wife, and surgeon, I have decided to admit the patient and he will undergo arterial grafting to the LAD, diagonal, and probable saphenous vein grafting to the right coronary on Monday. Decided against PCI because of the requirement for long stents in all segments and anticipated decreased durability over time given his young age and diabetes.  IV nitroglycerin, therapeutic Lovenox.  Screening COVID testing.  Sleep study as OP. Clinical history is strongly suggestive.  Coronary Findings   Diagnostic Dominance: Right  Left Anterior Descending  Prox LAD to Mid LAD lesion is 95% stenosed.  First Diagonal Branch  Ost 1st Diag to 1st Diag lesion is 80% stenosed.  Second Diagonal [employer] 2nd Diag to 2nd Diag lesion is 80% stenosed.  Left Circumflex  Vessel is small.  First Obtuse Marginal Branch  Vessel is small in  size.  Second Obtuse Marginal Branch  Vessel is small in size.  Right Coronary Artery  Mid RCA to Dist RCA lesion is 85% stenosed.   Echo 09/19/2018: IMPRESSIONS   1. The left ventricle has normal systolic function, with an ejection  fraction of 60-65%. The cavity size was normal. Left ventricular diastolic  function could not be evaluated.  2. The right ventricle has normal systolc function. The cavity was  normal. There is no increase in right ventricular wall thickness.  3. Aortic valve regurgitation was not assessed by color flow Doppler.   ASSESSMENT AND PLAN:  1. Preoperative clearance: -The patient does not have any unstable cardiac conditions.  Upon evaluation today, he can achieve 4 METs or greater without anginal symptoms. [facility] and AHA guidelines, he requires no further cardiac workup prior to his noncardiac surgery and should be at acceptable risk.  -Ok to hold ASA 5 days prior to surgery and resume when safe from a surgical standpoint.   2. CAD s/p CABG: -Denies anginal symptoms -Continue current regimen  -Can hold ASA 5 days prior to procedure>>resume when safe from a bleeding standpoint   3. Mixed HLD: -Last LDL, 137 09/2019>>needs repeat  -Follows with lipid clinic and currently on Praluent and Zetia>>tolerating well -Intolerant to statin therpay   4. HTN: -Stable, 122/72 -Started on amlodipine 5mg  at last OV>>listed as 10mg  on med list however states that he has been out of his amlodipine for two weeks>>restart at 5mg   -More stable>>likely due to positive lifestyle modifications  5. DM2: -Continues to intermittent fast>lost 20lb over the year -HbA1c down per patient report    Current medicines are reviewed at length with the patient today.  The patient does not have concerns regarding medicines.  The following changes have been made:  Reduce amlodipine to 5mg  PO QD   Labs/ tests ordered today include: None   Orders Placed This Encounter   Procedures  . VAS US CAROTID    Disposition:   FU with Dr. Excell Seltzerooper in 6 months  Signed, Georgie ChardJill Abbas Beyene, NP  08/22/2020 2:44 PM    [facility] [address], [place], [place]  [address] Phone: [phone]; Fax: [phone]

## 2020-08-22 NOTE — Patient Instructions (Addendum)
Medication Instructions:  Your physician has recommended you make the following change in your medication:  1. DECREASE AMLODIPINE TO 5 MG DAILY.  *If you need a refill on your cardiac medications before your next appointment, please call your pharmacy*   Lab Work: NONE If you have labs (blood work) drawn today and your tests are completely normal, you will receive your results only by: Marland Kitchen MyChart Message (if you have MyChart) OR . A paper copy in the mail If you have any lab test that is abnormal or we need to change your treatment, we will call you to review the results.   Testing/Procedures: Your physician has requested that you have a carotid duplex. This test is an ultrasound of the carotid arteries in your neck. It looks at blood flow through these arteries that supply the brain with blood. Allow one hour for this exam. There are no restrictions or special instructions. TO BE DONE IN MAY 2022  Follow-Up: At [facility], you and your health needs are our priority.  As part of our continuing mission to provide you with exceptional heart care, we have created designated Provider Care Teams.  These Care Teams include your primary Cardiologist (physician) and Advanced Practice Providers (APPs -  Physician Assistants and Nurse Practitioners) who all work together to provide you with the care you need, when you need it.  We recommend signing up for the patient portal called "MyChart".  Sign up information is provided on this After Visit Summary.  MyChart is used to connect with patients for Virtual Visits (Telemedicine).  Patients are able to view lab/test results, encounter notes, upcoming appointments, etc.  Non-urgent messages can be sent to your provider as well.   To learn more about what you can do with MyChart, go to [email or web address].    Your next appointment:   6 month(s)  The format for your next appointment:   In Person  Provider:   You may see DR. Excell Seltzer or one of  the following Advanced Practice Providers on your designated Care Team:    Tereso Newcomer, PA-C  [place], [place]

## 2020-08-23 ENCOUNTER — Ambulatory Visit: Payer: BC Managed Care – PPO | Admitting: Physician Assistant

## 2020-08-24 ENCOUNTER — Other Ambulatory Visit: Payer: Self-pay

## 2020-08-24 ENCOUNTER — Ambulatory Visit
Admission: RE | Admit: 2020-08-24 | Discharge: 2020-08-24 | Disposition: A | Discharge status: Home / Self Care | Payer: BC Managed Care – PPO | Source: Ambulatory Visit | Attending: Orthopedic Surgery | Admitting: Orthopedic Surgery

## 2020-08-24 DIAGNOSIS — M545 Low back pain, unspecified: Secondary | ICD-10-CM

## 2020-09-24 ENCOUNTER — Encounter: Payer: Self-pay | Admitting: Neurology

## 2020-09-24 ENCOUNTER — Ambulatory Visit (INDEPENDENT_AMBULATORY_CARE_PROVIDER_SITE_OTHER): Payer: BC Managed Care – PPO | Admitting: Neurology

## 2020-09-24 VITALS — BP 135/92 | HR 94 | Ht 69.0 in | Wt 194.0 lb

## 2020-09-24 DIAGNOSIS — R202 Paresthesia of skin: Secondary | ICD-10-CM | POA: Diagnosis not present

## 2020-09-24 DIAGNOSIS — R930 Abnormal findings on diagnostic imaging of skull and head, not elsewhere classified: Secondary | ICD-10-CM

## 2020-09-24 NOTE — Progress Notes (Signed)
[facility]  PATIENT: Danny Brewer DOB: 1971/05/19  REFERRING DOCTOR OR PCP:  Danny Brewer is PCP; referred by Danny Brewer SOURCE: Patient, notes from Dr. Shelle Brewer, multiple MRI and CT reports and images on PACS  _________________________________   HISTORICAL  CHIEF COMPLAINT:  Chief Complaint  Patient presents with  . New Patient (Initial Visit)    RM 12, alone. Paper referral from Danny CheshireFrancis Wong,MD for left arm numbness, headache, left facial weakness/numbness and left upper extremity numbness. These sx occurred after 2nd covid-19 (Moderna) vaccine. Thought he may have had stroke but this was r/o. Doing well since hospital visit. Gets intermittent headaches still. Had MRI, wanting this reviewed by Dr. Epimenio FootSater.     HISTORY OF PRESENT ILLNESS:  I had the pleasure of [facility] you patient, Danny BoerRalph Brewer[facility] neurologic consultation regarding the onset of left arm numbness, headache and left facial weakness/numbness after his second vaccination 04/2020.   He went to the ED and had an MRI.   MRI showed just one T2/FLAIR hyperintense focus in the right posterior pons, present in 2017 and one left temporal chronic microhemorrhage, not present in 2017.  Symptoms lasted just the one night and he felt baseline the next day.    He denies any difficulty with gait, balance, numbness or strength now.   He sleeps well most nights.  He tries to  Live a stress free life.    No depression or anxiety.     In May 2020 he had CABG due to CAD and angina.     He has changed his lifestyle and is eating better and exercising some.     Vascular risk factors:   HTN, NIDDM, CAD.   No h/o arrhythmia.   No significant OSA.     Imaging:  MRI of the cervical spine 03/11/2017. It showed degenerative changes at C4-C5, C5-C6 and C6-C7 but also showed a small T2 hyperintense focus within the right dorsolateral pons. He was referred for further evaluation.  MRI of  the brain dated 08/25/2015 and 02/23/2010 show a stable 3-4 mm focus in the posterior right pons near the middle cerebellar peduncle. It is unchanged in size or signal characteristics.  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     MRI brain 04/22/2020 showed a lesion in the brain that looked unchanged and thus chronic microhemorrhages in the posterior left temporal lobe.     REVIEW OF SYSTEMS: Constitutional: No fevers, chills, sweats, or change in appetite.   He has excessive sleepiness Eyes: No visual changes, double vision, eye pain Ear, nose and throat: No hearing loss, ear pain, nasal congestion, sore throat Cardiovascular: No chest pain, palpitations Respiratory: No shortness of breath at rest or with exertion.   No wheezes.  He snores GastrointestinaI: No nausea, vomiting, diarrhea, abdominal pain, fecal incontinence Genitourinary: No dysuria, urinary retention or frequency.  No nocturia. Musculoskeletal: No neck pain, back pain Integumentary: No rash, pruritus, skin lesions Neurological: as above Psychiatric: No depression at this time.  No anxiety Endocrine: No palpitations, diaphoresis, change in appetite, change in weigh or increased thirst Hematologic/Lymphatic: No anemia, purpura, petechiae. Allergic/Immunologic: No itchy/runny eyes, nasal congestion, recent allergic reactions, rashes  ALLERGIES: Allergies  Allergen Reactions  . Antihistamines, Diphenhydramine-Type Hives and Other (See Comments)    Seizures  . Demerol Hives  . Morphine And Related Hives    Esp with high dose morphine, possibly Dilaudid Tolerates hydrocodone, oxycodone  . Compazine [Prochlorperazine] Other (See Comments)  Spastic movement  . Metoprolol     Per patient caused "severe headache"    HOME MEDICATIONS:  Current Outpatient Medications:  .  acetaminophen (TYLENOL) 325 MG tablet, Take 650 mg by mouth every 6 (six) hours as needed for moderate pain., Disp: , Rfl:  .  amLODipine (NORVASC) 5  MG tablet, Take 1 tablet (5 mg total) by mouth daily., Disp: 90 tablet, Rfl: 3 .  aspirin 81 MG tablet, Take 1 tablet (81 mg total) by mouth daily., Disp: , Rfl:  .  ezetimibe (ZETIA) 10 MG tablet, TAKE 1 TABLET BY MOUTH EVERY DAY, Disp: 90 tablet, Rfl: 3 .  Nirmatrelvir & Ritonavir 20 x 150 MG & 10 x 100MG  TBPK, TAKE 3 TABLETS BY MOUTH 2 TIMES DAILY FOR 5 DAYS., Disp: 30 each, Rfl: 0 .  sitaGLIPtin (JANUVIA) 100 MG tablet, Take 100 mg by mouth daily., Disp: , Rfl:  .  tamsulosin (FLOMAX) 0.4 MG CAPS capsule, Take 0.4 mg by mouth daily., Disp: , Rfl:   PAST MEDICAL HISTORY: Past Medical History:  Diagnosis Date  . Arthritis   . Carotid 10/15/2018   Carotid 12/15/2018 10/2018: bilateral ICA 1-39  . Complication of anesthesia    Pt verble and "prophecizes" after anesthesia  . Coronary Artery Disease    Coronary CTA 09/2018: pLAD 70-90; pD1+pD2 70-90; mRCA mixed plaque - cannot rule out 70-90; FFR suggests hemodynamically significant stenosis // LHC 10/2018: 2v CAD >> s/p  CABG (L-LAD, L radial-RI, S-D2, S-RCA) with Dr. 11/2018   . Diabetes mellitus    type 2  . Echocardiogram 09/2018    Echo 09/2018: EF 60-65  . GERD   . History of kidney stones   . Hyperlipidemia   . Hypertension   . Inguinal hernia 2016   right  . Pneumonia   . Vision abnormalities     PAST SURGICAL HISTORY: Past Surgical History:  Procedure Laterality Date  . CORONARY ARTERY BYPASS GRAFT N/A 10/17/2018   Procedure: CORONARY ARTERY BYPASS GRAFTING (CABG), ON PUMP, TIMES FOUR, USING LIMA TO LAD, ENDOSCOPICALLY HARVESTED GREATER SAPHENOUS VEIN TO DIAG 2, SVG TO DISTAL RCA, AND LEFT RADIAL TO RAMUS INTERMEDIUS;  Surgeon: 12/17/2018, MD;  Location: [facility] OR;  Service: Open Heart Surgery;  Laterality: N/A;  . ENDOVEIN HARVEST OF GREATER SAPHENOUS VEIN Right 10/17/2018   Procedure: 12/17/2018 Of Greater Saphenous Vein;  Surgeon: Mack Guise, MD;  Location[facility] OR;  Service: Open Heart Surgery;  Laterality: Right;   . HAND SURGERY Left 1987  . HERNIA REPAIR    . INGUINAL HERNIA REPAIR Bilateral 05/27/2015   Procedure: LAPAROSCOPIC BILATERAL  INGUINAL HERNIA REPAIRS WITH MESH;  Surgeon: 05/29/2015, MD;  Location: [facility] ORS;  Service: General;  Laterality: Bilateral;  . KNEE SURGERY     left scope  . LEFT HEART CATH AND CORONARY ANGIOGRAPHY N/A 10/14/2018   Procedure: LEFT HEART CATH AND CORONARY ANGIOGRAPHY;  Surgeon: 12/14/2018, MD;  Location: [facility] INVASIVE CV LAB;  Service: Cardiovascular;  Laterality: N/A;  . RADIAL ARTERY HARVEST Left 10/17/2018   Procedure: RADIAL ARTERY HARVEST;  Surgeon: 12/17/2018, MD;  Location: [facility] OR;  Service: Open Heart Surgery;  Laterality: Left;  . SHOULDER CLOSED REDUCTION Left 09/04/2019   Procedure: CLOSED MANIPULATION SHOULDER;  Surgeon: 09/06/2019, MD;  Location: [facility] ORS;  Service: Orthopedics;  Laterality: Left;  . TEE WITHOUT CARDIOVERSION N/A 10/17/2018   Procedure: TRANSESOPHAGEAL ECHOCARDIOGRAM (TEE);  Surgeon: 12/17/2018, MD;  Location:  [facility] OR;  Service: Open Heart Surgery;  Laterality: N/A;    FAMILY HISTORY: Family History  Problem Relation Age of Onset  . Hypertension Mother   . Cancer Father   . Heart failure Father   . Sudden death Brother   . Heart attack Brother   . Hypertension Sister   . Heart murmur Sister     SOCIAL HISTORY:  Social History   Socioeconomic History  . Marital status: Married    Spouse name: Not on file  . Number of children: Not on file  . Years of education: 35  . Highest education level: Not on file  Occupational History  . Occupation:  a and t  Tobacco Use  . Smoking status: Never Smoker  . Smokeless tobacco: Never Used  Vaping Use  . Vaping Use: Never used  Substance and Sexual Activity  . Alcohol use: No  . Drug use: No  . Sexual activity: Not Currently  Other Topics Concern  . Not on file  Social History Narrative   Right handed   Caffeine use: 1 tea/day   Social Determinants of Health    Financial Resource Strain: Not on file  Food Insecurity: Not on file  Transportation Needs: Not on file  Physical Activity: Not on file  Stress: Not on file  Social Connections: Not on file  Intimate Partner Violence: Not on file     PHYSICAL EXAM  Vitals:   09/24/20 1421  BP: (!) 135/92  Pulse: 94  Weight: 194 lb (88 kg)  Height: 5\' 9"  (1.753 m)    Body mass index is 28.65 kg/m.   General: The patient is well-developed and well-nourished and in no acute distress.  Pharynx is Mallampati 2  Neck: The neck is supple, no carotid bruits are noted.  The neck is nontender with slightly reduced ROM.    Cardiovascular: The heart has a regular rate and rhythm with a normal S1 and S2. There were no murmurs, gallops or rubs. Lungs are clear to auscultation.  Skin: Extremities are without significant edema.   Sternotomy scar, left forearm scr from vessel grafting.    Musculoskeletal:  Back is nontender  Neurologic Exam  Mental status: The patient is alert and oriented x 3 at the time of the examination. The patient has apparent normal recent and remote memory, with an apparently normal attention span and concentration ability.   Speech is normal.  Cranial nerves: Extraocular movements are full.  Facial strength and sensation is normal.  The tongue is midline, and the patient has symmetric elevation of the soft palate. No obvious hearing deficits are noted.  Motor:  Muscle bulk is normal.   Tone is normal. Strength is  5 / 5 in all 4 extremities.   Sensory: Sensory testing is intact to pinprick, soft touch and vibration sensation in all 4 extremities.  Coordination: Cerebellar testing reveals good finger-nose-finger and heel-to-shin bilaterally.  Gait and station: Station is normal.   Gait is normal. Tandem gait is normal. Romberg is negative.   Reflexes: Deep tendon reflexes are 3 at knees and 2 elsewhere.         DIAGNOSTIC DATA (LABS, IMAGING, TESTING) - I reviewed  patient records, labs, notes, testing and imaging myself where available.  Lab Results  Component Value Date   WBC 8.0 04/22/2020   HGB 15.0 04/22/2020   HCT 44.0 04/22/2020   MCV 90.2 04/22/2020   PLT 237 04/22/2020      Component Value Date/Time  NA 144 04/22/2020 2241   NA 139 09/15/2019 1237   K 3.4 (L) 04/22/2020 2241   CL 106 04/22/2020 2241   CO2 25 04/22/2020 2239   GLUCOSE 81 04/22/2020 2241   BUN 13 04/22/2020 2241   BUN 16 09/15/2019 1237   CREATININE 1.30 (H) 04/22/2020 2241   CREATININE 0.93 12/18/2014 1157   CALCIUM 9.0 04/22/2020 2239   PROT 7.0 04/22/2020 2239   PROT 7.0 09/15/2019 1237   ALBUMIN 4.1 04/22/2020 2239   ALBUMIN 4.1 09/15/2019 1237   AST 26 04/22/2020 2239   ALT 21 04/22/2020 2239   ALKPHOS 56 04/22/2020 2239   BILITOT 0.6 04/22/2020 2239   BILITOT 0.3 09/15/2019 1237   GFRNONAA >60 04/22/2020 2239   GFRNONAA >89 12/18/2014 1157   GFRAA >60 11/09/2019 0854   GFRAA >89 12/18/2014 1157   Lab Results  Component Value Date   CHOL 195 09/15/2019   HDL 43 09/15/2019   LDLCALC 137 (H) 09/15/2019   TRIG 82 09/15/2019   CHOLHDL 4.5 09/15/2019   Lab Results  Component Value Date   HGBA1C 6.4 (H) 08/30/2019   Lab Results  Component Value Date   VITAMINB12 314 08/28/2015   Lab Results  Component Value Date   TSH 3.900 09/18/2018       ASSESSMENT AND PLAN  Abnormal MRI of head  Paresthesias  In summary, Mr. Barrell is a 50 year old man with transient neurologic symptoms and an abnormal MRI showing a 3-4 mm focus in the right posterior pons had a T2/flair hyperintense focus in the posterior right pons.  The MRI is unchanged over many years so that focus is unlikely to be related to his symptoms.  He is advised to call us if he has new or worsening neurologic symptoms.  The single chronic microhemorrhage is unlikely be clinically significant and is likely due to hypertension.    Eller Sweis A. Epimenio Foot, MD, [facility] 09/24/2020, 11:07  PM Certified in Neurology, Clinical Neurophysiology, Sleep Medicine, Pain Medicine and Neuroimaging  [facility] [address] [place], [place] [address] [phone]

## 2020-10-01 ENCOUNTER — Other Ambulatory Visit: Payer: Self-pay

## 2020-10-01 ENCOUNTER — Other Ambulatory Visit (HOSPITAL_COMMUNITY)
Admission: RE | Admit: 2020-10-01 | Discharge: 2020-10-01 | Disposition: A | Discharge status: Home / Self Care | Payer: BC Managed Care – PPO | Source: Ambulatory Visit | Attending: Urology | Admitting: Urology

## 2020-10-01 ENCOUNTER — Encounter (HOSPITAL_BASED_OUTPATIENT_CLINIC_OR_DEPARTMENT_OTHER): Payer: Self-pay | Admitting: Urology

## 2020-10-01 DIAGNOSIS — Z01812 Encounter for preprocedural laboratory examination: Secondary | ICD-10-CM | POA: Insufficient documentation

## 2020-10-01 DIAGNOSIS — Z20822 Contact with and (suspected) exposure to covid-19: Secondary | ICD-10-CM | POA: Insufficient documentation

## 2020-10-01 NOTE — Progress Notes (Addendum)
Spoke w/ via phone for pre-op interview--- PT Lab needs dos----  Istat             Lab results------ current ekg in epic/ chart COVID test ---- done 10-01-2020 result in epic  Arrive at ------- 0530 on 10-03-2020 NPO after MN NO Solid Food.  Clear liquids from MN until--- 0430 Med rec completed Medications to take morning of surgery ----- Norvasc, Flomax, Zetia Diabetic medication ----- do not take januvia morning of surgery  Patient instructed to bring photo id and insurance card day of surgery Patient aware to have Driver (ride ) / caregiver    for 24 hours after surgery -- wife, Erskine Squibb Patient Special Instructions ----- reviewed RCC and visitor guidelines  Pre-Op special Istructions ----- pt has cardiac office note clearance by Georgie Chard NP on 08-22-2020 in epic/ chart Patient verbalized understanding of instructions that were given at this phone interview. Patient denies shortness of breath, chest pain, fever, cough at this phone interview.   Anesthesia HTN: CAD s/p CABG x4 05/ 2020;  DM2;  Pt denies any chest pain, sob, and no peripheral swelling.    PCP:  Dr Carmon Ginsberg. [place]esto Charon Cardiologist : Dr Copper (lov 08-22-2020 epic) Chest x-ray : 11-09-2019/ CT chest 11-19-2018 EKG :  04-22-2020 Echo : 09-19-2018/ TEE 10-17-2018 Stress test:  Gated coronary CTA 04-30-+2020 Cardiac Cath : 10-14-2018 Activity level:  Denies sob w/ any activity Sleep Study/ CPAP :  NO Fasting Blood Sugar :  72--105    / Checks Blood Sugar -- times a day:  3 times per wk Blood Thinner/ Instructions /Last Dose: NO ASA / Instructions/ Last Dose :  ASA 81mg ;  Pt stated was told to stop prior to surgery by dr office, stated last dose approx. 09-17-2020

## 2020-10-02 LAB — SARS CORONAVIRUS 2 (TAT 6-24 HRS): SARS Coronavirus 2: NEGATIVE

## 2020-10-02 NOTE — H&P (Signed)
H&P  Chief Complaint: ED  History of Present Illness: 50 yo male w/ ED--not adequately treated with medical therapy presents for IPP placement.  Past Medical History:  Diagnosis Date  . BPH (benign prostatic hyperplasia)   . Complication of anesthesia    Pt verble and "prophecizes" after anesthesia  . Coronary Artery Disease cardiologist--- dr cooper   Coronary CTA 09/2018: pLAD 70-90; pD1+pD2 70-90; mRCA mixed plaque - cannot rule out 70-90; FFR suggests hemodynamically significant stenosis // LHC 10/2018: 2v CAD >> s/p  CABG (L-LAD, L radial-RI, S-D2, S-RCA) with Dr. Tyrone Sage   . ED (erectile dysfunction)   . GERD    watches diet  . History of kidney stones   . Hypertension   . Mixed hyperlipidemia   . OA (osteoarthritis)    neck , back  . 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 Type 2 diabetes mellitus (HCC)    followed by pcp   (10-01-2020 checks blood sugar 3 times wkly,  fasting sugar-- 72--105)  . Wears glasses     Past Surgical History:  Procedure Laterality Date  . CORONARY ARTERY BYPASS GRAFT N/A 10/17/2018   Procedure: CORONARY ARTERY BYPASS GRAFTING (CABG), ON PUMP, TIMES FOUR, USING LIMA TO LAD, ENDOSCOPICALLY HARVESTED GREATER SAPHENOUS VEIN TO DIAG 2, SVG TO DISTAL RCA, AND LEFT RADIAL TO RAMUS INTERMEDIUS;  Surgeon: [name], MD;  Location: [facility] OR;  Service: Open Heart Surgery;  Laterality: N/A;  . ENDOVEIN HARVEST OF GREATER SAPHENOUS VEIN Right 10/17/2018   Procedure: Mack Guise Of Greater Saphenous Vein;  Surgeon: [name], MD;  Location: [facility] OR;  Service: Open Heart Surgery;  Laterality: Right;  . HAND SURGERY Left 1987   complex laceration  . INGUINAL HERNIA REPAIR Bilateral 05/27/2015   Procedure: LAPAROSCOPIC BILATERAL  INGUINAL HERNIA REPAIRS WITH MESH;  Surgeon: Karie Soda, MD;  Location: [facility] ORS;  Service: General;  Laterality: Bilateral;  . KNEE ARTHROSCOPY Left 1991  . LEFT HEART CATH AND  CORONARY ANGIOGRAPHY N/A 10/14/2018   Procedure: LEFT HEART CATH AND CORONARY ANGIOGRAPHY;  Surgeon: Lyn Records, MD;  Location: [facility] INVAS[facility] LAB;  Service: Cardiovascular;  Laterality: N/A;  . RADIAL ARTERY HARVEST Left 10/17/2018   Procedure: RADIAL ARTERY HARVEST;  Surgeon: [name], MD;  Location: [facility] OR;  Service: Open Heart Surgery;  Laterality: Left;  . SHOULDER CLOSED REDUCTION Left 09/04/2019   Procedure: CLOSED MANIPULATION SHOULDER;  Surgeon: Dannielle Huh, MD;  Location: [facility] ORS;  Service: Orthopedics;  Laterality: Left;  . TEE WITHOUT CARDIOVERSION N/A 10/17/2018   Procedure: TRANSESOPHAGEAL ECHOCARDIOGRAM (TEE);  Surgeon: [name], MD;  Location: [facility] OR;  Service: Open Heart Surgery;  Laterality: N/A;    Home Medications:  Allergies as of 10/02/2020      Reactions   Antihistamines, Diphenhydramine-type Hives, Other (See Comments)   Seizures   Demerol Hives   Morphine And Related Hives   Esp with high dose morphine, possibly Dilaudid Tolerates hydrocodone, oxycodone   Compazine [prochlorperazine] Other (See Comments)   Spastic movement   Metoprolol    Per patient caused "severe headache"   Statins Other (See Comments)   myalgias      Medication List    Notice   Cannot display discharge medications because the patient has not yet been admitted.     Allergies:  Allergies  Allergen Reactions  . Antihistamines, Diphenhydramine-Type Hives and Other (See Comments)  Seizures  . Demerol Hives  . Morphine And Related Hives    Esp with high dose morphine, possibly Dilaudid Tolerates hydrocodone, oxycodone  . Compazine [Prochlorperazine] Other (See Comments)    Spastic movement  . Metoprolol     Per patient caused "severe headache"  . Statins Other (See Comments)    myalgias    Family History  Problem Relation Age of Onset  . Hypertension Mother   . Cancer Father   . Heart failure Father   . Sudden death Brother   . Heart attack Brother   .  Hypertension Sister   . Heart murmur Sister     Social History:  reports that he has never smoked. He has never used smokeless tobacco. He reports that he does not drink alcohol and does not use drugs.  ROS: A complete review of systems was performed.  All systems are negative except for pertinent findings as noted.  Physical Exam:  Vital signs in last 24 hours: Ht 5\' 9"  (1.753 m)   Wt 85.7 kg   BMI 27.91 kg/m  Constitutional:  Alert and oriented, No acute distress Cardiovascular: Regular rate  Respiratory: Normal respiratory effort GI: Abdomen is soft, nontender, nondistended, no abdominal masses. No CVAT.  Genitourinary: Normal male phallus, testes are descended bilaterally and non-tender and without masses, scrotum is normal in appearance without lesions or masses, perineum is normal on inspection. Lymphatic: No lymphadenopathy Neurologic: Grossly intact, no focal deficits Psychiatric: Normal mood and affect  Laboratory Data:  No results for input(s): WBC, HGB, HCT, PLT in the last 72 hours.  No results for input(s): NA, K, CL, GLUCOSE, BUN, CALCIUM, CREATININE in the last 72 hours.  Invalid input(s): CO3   No results found for this or any previous visit (from the past 24 hour(s)). Recent Results (from the past 240 hour(s))  SARS CORONAVIRUS 2 (TAT 6-24 HRS) Nasopharyngeal Nasopharyngeal Swab     Status: None   Collection Time: 10/01/20  9:33 AM   Specimen: Nasopharyngeal Swab  Result Value Ref Range Status   SARS Coronavirus 2 NEGATIVE NEGATIVE Final    Comment: (NOTE) SARS-CoV-2 target nucleic acids are NOT DETECTED.  The SARS-CoV-2 RNA is generally detectable in upper and lower respiratory specimens during the acute phase of infection. Negative results do not preclude SARS-CoV-2 infection, do not rule out co-infections with other pathogens, and should not be used as the sole basis for treatment or other patient management decisions. Negative results must be  combined with clinical observations, patient history, and epidemiological information. The expected result is Negative.  Fact Sheet for Patients: 10/03/20  Fact Sheet for Healthcare Providers: HairSlick.no  This test is not yet approved or cleared by the quierodirigir.com FDA and  has been authorized for detection and/or diagnosis of SARS-CoV-2 by FDA under an Emergency Use Authorization (EUA). This EUA will remain  in effect (meaning this test can be used) for the duration of the COVID-19 declaration under Se ction 564(b)(1) of the Act, 21 U.S.C. section 360bbb-3(b)(1), unless the authorization is terminated or revoked sooner.  Performed at [facility], [address], [place], [place]     Renal Function: No results for input(s): CREATININE in the last 168 hours. CrCl cannot be calculated (Patient's most recent lab result is older than the maximum 21 days allowed.).  Radiologic Imaging: No results found.  Impression/Assessment:  ED  Plan:  Placement of Coloplast 3 piece IPP

## 2020-10-02 NOTE — Anesthesia Preprocedure Evaluation (Addendum)
Anesthesia Evaluation  Patient identified by MRN, date of birth, ID band Patient awake    Reviewed: Allergy & Precautions, NPO status , Patient's Chart, lab work & pertinent test results  History of Anesthesia Complications Negative for: history of anesthetic complications  Airway Mallampati: II  TM Distance: >3 FB Neck ROM: Full    Dental  (+) Dental Advisory Given, Teeth Intact   Pulmonary sleep apnea ,    Pulmonary exam normal        Cardiovascular hypertension, Pt. on medications + CAD and + CABG  Normal cardiovascular exam   '20 TTE - EF 55-60%, trivial MR, TR, and PR  '20 Carotid US - 1-39% b/l ICAS    Neuro/Psych negative neurological ROS  negative psych ROS   GI/Hepatic Neg liver ROS, GERD  Controlled,  Endo/Other  diabetes, Type 2, Oral Hypoglycemic Agents  Renal/GU negative Renal ROS     Musculoskeletal  (+) Arthritis ,   Abdominal   Peds  Hematology negative hematology ROS (+)   Anesthesia Other Findings Covid test negative   Reproductive/Obstetrics                            Anesthesia Physical Anesthesia Plan  ASA: III  Anesthesia Plan: General   Post-op Pain Management:    Induction: Intravenous  PONV Risk Score and Plan: 2 and Treatment may vary due to age or medical condition, Ondansetron, Midazolam and Dexamethasone  Airway Management Planned: LMA  Additional Equipment: None  Intra-op Plan:   Post-operative Plan: Extubation in OR  Informed Consent: I have reviewed the patients History and Physical, chart, labs and discussed the procedure including the risks, benefits and alternatives for the proposed anesthesia with the patient or authorized representative who has indicated his/her understanding and acceptance.     Dental advisory given  Plan Discussed with: CRNA and Anesthesiologist  Anesthesia Plan Comments:        Anesthesia Quick  Evaluation

## 2020-10-03 ENCOUNTER — Ambulatory Visit (HOSPITAL_BASED_OUTPATIENT_CLINIC_OR_DEPARTMENT_OTHER): Payer: BC Managed Care – PPO | Admitting: Anesthesiology

## 2020-10-03 ENCOUNTER — Ambulatory Visit (HOSPITAL_BASED_OUTPATIENT_CLINIC_OR_DEPARTMENT_OTHER)
Admission: RE | Admit: 2020-10-03 | Discharge: 2020-10-04 | Disposition: A | Discharge status: Home / Self Care | Payer: BC Managed Care – PPO | Attending: Urology | Admitting: Urology

## 2020-10-03 ENCOUNTER — Other Ambulatory Visit: Payer: Self-pay

## 2020-10-03 ENCOUNTER — Encounter (HOSPITAL_BASED_OUTPATIENT_CLINIC_OR_DEPARTMENT_OTHER): Payer: Self-pay | Admitting: Urology

## 2020-10-03 ENCOUNTER — Encounter (HOSPITAL_BASED_OUTPATIENT_CLINIC_OR_DEPARTMENT_OTHER)
Admission: RE | Disposition: A | Discharge status: Home / Self Care | Payer: Self-pay | Source: Home / Self Care | Attending: Urology

## 2020-10-03 DIAGNOSIS — Z8249 Family history of ischemic heart disease and other diseases of the circulatory system: Secondary | ICD-10-CM | POA: Diagnosis not present

## 2020-10-03 DIAGNOSIS — Z888 Allergy status to other drugs, medicaments and biological substances status: Secondary | ICD-10-CM | POA: Insufficient documentation

## 2020-10-03 DIAGNOSIS — I10 Essential (primary) hypertension: Secondary | ICD-10-CM | POA: Diagnosis not present

## 2020-10-03 DIAGNOSIS — E119 Type 2 diabetes mellitus without complications: Secondary | ICD-10-CM | POA: Insufficient documentation

## 2020-10-03 DIAGNOSIS — N529 Male erectile dysfunction, unspecified: Secondary | ICD-10-CM | POA: Insufficient documentation

## 2020-10-03 DIAGNOSIS — N400 Enlarged prostate without lower urinary tract symptoms: Secondary | ICD-10-CM | POA: Diagnosis not present

## 2020-10-03 DIAGNOSIS — Z951 Presence of aortocoronary bypass graft: Secondary | ICD-10-CM | POA: Insufficient documentation

## 2020-10-03 DIAGNOSIS — Z885 Allergy status to narcotic agent status: Secondary | ICD-10-CM | POA: Diagnosis not present

## 2020-10-03 DIAGNOSIS — E782 Mixed hyperlipidemia: Secondary | ICD-10-CM | POA: Insufficient documentation

## 2020-10-03 DIAGNOSIS — I2510 Atherosclerotic heart disease of native coronary artery without angina pectoris: Secondary | ICD-10-CM | POA: Diagnosis not present

## 2020-10-03 HISTORY — DX: Benign prostatic hyperplasia without lower urinary tract symptoms: N40.0

## 2020-10-03 HISTORY — DX: Mixed hyperlipidemia: E78.2

## 2020-10-03 HISTORY — DX: Unspecified osteoarthritis, unspecified site: M19.90

## 2020-10-03 HISTORY — DX: Type 2 diabetes mellitus without complications: E11.9

## 2020-10-03 HISTORY — DX: Presence of spectacles and contact lenses: Z97.3

## 2020-10-03 HISTORY — DX: Male erectile dysfunction, unspecified: N52.9

## 2020-10-03 HISTORY — PX: PENILE PROSTHESIS IMPLANT: SHX240

## 2020-10-03 LAB — POCT I-STAT, CHEM 8
BUN: 14 mg/dL (ref 6–20)
Calcium, Ion: 1.12 mmol/L — ABNORMAL LOW (ref 1.15–1.40)
Chloride: 106 mmol/L (ref 98–111)
Creatinine, Ser: 1.1 mg/dL (ref 0.61–1.24)
Glucose, Bld: 109 mg/dL — ABNORMAL HIGH (ref 70–99)
HCT: 43 % (ref 39.0–52.0)
Hemoglobin: 14.6 g/dL (ref 13.0–17.0)
Potassium: 3.7 mmol/L (ref 3.5–5.1)
Sodium: 142 mmol/L (ref 135–145)
TCO2: 24 mmol/L (ref 22–32)

## 2020-10-03 LAB — GLUCOSE, CAPILLARY
Glucose-Capillary: 127 mg/dL — ABNORMAL HIGH (ref 70–99)
Glucose-Capillary: 144 mg/dL — ABNORMAL HIGH (ref 70–99)

## 2020-10-03 SURGERY — INSERTION, PENILE PROSTHESIS, INFLATABLE
Anesthesia: General | Site: Penis

## 2020-10-03 MED ORDER — ACETAMINOPHEN 500 MG PO TABS
1000.0000 mg | ORAL_TABLET | Freq: Four times a day (QID) | ORAL | Status: DC
  Administered 2020-10-03 – 2020-10-04 (×4): 1000 mg via ORAL

## 2020-10-03 MED ORDER — OXYCODONE HCL 5 MG PO TABS
ORAL_TABLET | ORAL | Status: AC
  Filled 2020-10-03: qty 2

## 2020-10-03 MED ORDER — PHENYLEPHRINE 40 MCG/ML (10ML) SYRINGE FOR IV PUSH (FOR BLOOD PRESSURE SUPPORT)
PREFILLED_SYRINGE | INTRAVENOUS | Status: DC | PRN
  Administered 2020-10-03: 80 ug via INTRAVENOUS
  Administered 2020-10-03 (×2): 120 ug via INTRAVENOUS
  Administered 2020-10-03: 80 ug via INTRAVENOUS

## 2020-10-03 MED ORDER — SODIUM CHLORIDE 0.9 % IR SOLN
Status: DC | PRN
  Administered 2020-10-03: 500 mL

## 2020-10-03 MED ORDER — AMLODIPINE BESYLATE 5 MG PO TABS
5.0000 mg | ORAL_TABLET | Freq: Every day | ORAL | Status: DC
  Filled 2020-10-03: qty 1

## 2020-10-03 MED ORDER — FENTANYL CITRATE (PF) 100 MCG/2ML IJ SOLN
INTRAMUSCULAR | Status: DC | PRN
  Administered 2020-10-03: 50 ug via INTRAVENOUS
  Administered 2020-10-03: 25 ug via INTRAVENOUS
  Administered 2020-10-03: 100 ug via INTRAVENOUS
  Administered 2020-10-03: 25 ug via INTRAVENOUS
  Administered 2020-10-03: 50 ug via INTRAVENOUS

## 2020-10-03 MED ORDER — TAMSULOSIN HCL 0.4 MG PO CAPS
0.4000 mg | ORAL_CAPSULE | Freq: Every day | ORAL | Status: DC
  Administered 2020-10-03: 0.4 mg via ORAL

## 2020-10-03 MED ORDER — FENTANYL CITRATE (PF) 100 MCG/2ML IJ SOLN
25.0000 ug | INTRAMUSCULAR | Status: DC | PRN
  Administered 2020-10-03: 25 ug via INTRAVENOUS
  Administered 2020-10-03: 50 ug via INTRAVENOUS
  Administered 2020-10-03: 25 ug via INTRAVENOUS

## 2020-10-03 MED ORDER — ACETAMINOPHEN 500 MG PO TABS
ORAL_TABLET | ORAL | Status: AC
  Filled 2020-10-03: qty 2

## 2020-10-03 MED ORDER — VANCOMYCIN HCL 1 G IV SOLR
Freq: Once | INTRAVENOUS | Status: AC
  Filled 2020-10-03 (×2): qty 1000

## 2020-10-03 MED ORDER — PROPOFOL 500 MG/50ML IV EMUL
INTRAVENOUS | Status: AC
  Filled 2020-10-03: qty 50

## 2020-10-03 MED ORDER — LIDOCAINE 2% (20 MG/ML) 5 ML SYRINGE
INTRAMUSCULAR | Status: DC | PRN
  Administered 2020-10-03: 60 mg via INTRAVENOUS

## 2020-10-03 MED ORDER — OXYBUTYNIN CHLORIDE 5 MG PO TABS
ORAL_TABLET | ORAL | Status: AC
  Filled 2020-10-03: qty 1

## 2020-10-03 MED ORDER — ONDANSETRON HCL 4 MG/2ML IJ SOLN
INTRAMUSCULAR | Status: DC | PRN
  Administered 2020-10-03: 4 mg via INTRAVENOUS

## 2020-10-03 MED ORDER — VANCOMYCIN HCL IN DEXTROSE 1-5 GM/200ML-% IV SOLN
INTRAVENOUS | Status: AC
  Filled 2020-10-03: qty 200

## 2020-10-03 MED ORDER — LACTATED RINGERS IV SOLN: INTRAVENOUS | Status: DC

## 2020-10-03 MED ORDER — EPHEDRINE SULFATE-NACL 50-0.9 MG/10ML-% IV SOSY
PREFILLED_SYRINGE | INTRAVENOUS | Status: DC | PRN
  Administered 2020-10-03: 5 mg via INTRAVENOUS

## 2020-10-03 MED ORDER — DEXAMETHASONE SODIUM PHOSPHATE 4 MG/ML IJ SOLN
INTRAMUSCULAR | Status: DC | PRN
  Administered 2020-10-03: 10 mg via INTRAVENOUS

## 2020-10-03 MED ORDER — TAMSULOSIN HCL 0.4 MG PO CAPS
ORAL_CAPSULE | ORAL | Status: AC
  Filled 2020-10-03: qty 1

## 2020-10-03 MED ORDER — OXYCODONE HCL 5 MG PO TABS
5.0000 mg | ORAL_TABLET | ORAL | Status: DC | PRN
  Administered 2020-10-03 – 2020-10-04 (×5): 10 mg via ORAL

## 2020-10-03 MED ORDER — PHENYLEPHRINE 40 MCG/ML (10ML) SYRINGE FOR IV PUSH (FOR BLOOD PRESSURE SUPPORT)
PREFILLED_SYRINGE | INTRAVENOUS | Status: AC
  Filled 2020-10-03: qty 10

## 2020-10-03 MED ORDER — OXYBUTYNIN CHLORIDE 5 MG PO TABS
5.0000 mg | ORAL_TABLET | Freq: Three times a day (TID) | ORAL | Status: DC | PRN
  Administered 2020-10-03: 5 mg via ORAL

## 2020-10-03 MED ORDER — ONDANSETRON HCL 4 MG/2ML IJ SOLN
INTRAMUSCULAR | Status: AC
  Filled 2020-10-03: qty 2

## 2020-10-03 MED ORDER — FENTANYL CITRATE (PF) 100 MCG/2ML IJ SOLN
INTRAMUSCULAR | Status: AC
  Filled 2020-10-03: qty 2

## 2020-10-03 MED ORDER — ONDANSETRON 4 MG PO TBDP: 4.0000 mg | ORAL_TABLET | Freq: Four times a day (QID) | ORAL | Status: DC | PRN

## 2020-10-03 MED ORDER — EZETIMIBE 10 MG PO TABS
10.0000 mg | ORAL_TABLET | Freq: Every day | ORAL | Status: DC
  Filled 2020-10-03: qty 1

## 2020-10-03 MED ORDER — MIDAZOLAM HCL 5 MG/5ML IJ SOLN
INTRAMUSCULAR | Status: DC | PRN
  Administered 2020-10-03: 2 mg via INTRAVENOUS

## 2020-10-03 MED ORDER — ONDANSETRON HCL 4 MG/2ML IJ SOLN: 4.0000 mg | Freq: Four times a day (QID) | INTRAMUSCULAR | Status: DC | PRN

## 2020-10-03 MED ORDER — GENTAMICIN SULFATE 40 MG/ML IJ SOLN
5.0000 mg/kg | INTRAVENOUS | Status: AC
  Administered 2020-10-03: 383.6 mg via INTRAVENOUS
  Filled 2020-10-03: qty 9.5

## 2020-10-03 MED ORDER — ONDANSETRON HCL 4 MG/2ML IJ SOLN: 4.0000 mg | Freq: Once | INTRAMUSCULAR | Status: DC | PRN

## 2020-10-03 MED ORDER — OXYCODONE HCL 5 MG/5ML PO SOLN: 5.0000 mg | Freq: Once | ORAL | Status: DC | PRN

## 2020-10-03 MED ORDER — MIDAZOLAM HCL 2 MG/2ML IJ SOLN
INTRAMUSCULAR | Status: AC
  Filled 2020-10-03: qty 2

## 2020-10-03 MED ORDER — DEXMEDETOMIDINE (PRECEDEX) IN NS 20 MCG/5ML (4 MCG/ML) IV SYRINGE
PREFILLED_SYRINGE | INTRAVENOUS | Status: DC | PRN
  Administered 2020-10-03: 12 ug via INTRAVENOUS
  Administered 2020-10-03: 8 ug via INTRAVENOUS

## 2020-10-03 MED ORDER — VANCOMYCIN HCL IN DEXTROSE 1-5 GM/200ML-% IV SOLN
1000.0000 mg | INTRAVENOUS | Status: AC
  Administered 2020-10-03: 1000 mg via INTRAVENOUS

## 2020-10-03 MED ORDER — DEXAMETHASONE SODIUM PHOSPHATE 10 MG/ML IJ SOLN
INTRAMUSCULAR | Status: AC
  Filled 2020-10-03: qty 1

## 2020-10-03 MED ORDER — OXYCODONE HCL 5 MG PO TABS: 5.0000 mg | ORAL_TABLET | Freq: Once | ORAL | Status: DC | PRN

## 2020-10-03 MED ORDER — FENTANYL CITRATE (PF) 250 MCG/5ML IJ SOLN
INTRAMUSCULAR | Status: AC
  Filled 2020-10-03: qty 5

## 2020-10-03 MED ORDER — PROPOFOL 10 MG/ML IV BOLUS
INTRAVENOUS | Status: DC | PRN
  Administered 2020-10-03: 200 mg via INTRAVENOUS

## 2020-10-03 SURGICAL SUPPLY — 77 items
ADH SKN CLS APL DERMABOND .7 (GAUZE/BANDAGES/DRESSINGS) ×1
APL PRP STRL LF DISP 70% ISPRP (MISCELLANEOUS) ×1
APL SKNCLS STERI-STRIP NONHPOA (GAUZE/BANDAGES/DRESSINGS)
APL SWBSTK 6 STRL LF DISP (MISCELLANEOUS)
APPLICATOR COTTON TIP 6 STRL (MISCELLANEOUS) IMPLANT
APPLICATOR COTTON TIP 6IN STRL (MISCELLANEOUS)
BAG DECANTER FOR FLEXI CONT (MISCELLANEOUS) ×2 IMPLANT
BAG DRN RND TRDRP ANRFLXCHMBR (UROLOGICAL SUPPLIES) ×1
BAG URINE DRAIN 2000ML AR STRL (UROLOGICAL SUPPLIES) ×2 IMPLANT
BENZOIN TINCTURE PRP APPL 2/3 (GAUZE/BANDAGES/DRESSINGS) IMPLANT
BLADE CLIPPER SENSICLIP SURGIC (BLADE) ×2 IMPLANT
BLADE HEX COATED 2.75 (ELECTRODE) ×2 IMPLANT
BLADE SURG 15 STRL LF DISP TIS (BLADE) ×2 IMPLANT
BLADE SURG 15 STRL SS (BLADE) ×4
BNDG COHESIVE 2X5 TAN STRL LF (GAUZE/BANDAGES/DRESSINGS) IMPLANT
BNDG GAUZE ELAST 4 BULKY (GAUZE/BANDAGES/DRESSINGS) ×2 IMPLANT
CANISTER SUCT 1200ML W/VALVE (MISCELLANEOUS) IMPLANT
CANISTER SUCT 3000ML PPV (MISCELLANEOUS) IMPLANT
CATH FOLEY 2WAY SLVR  5CC 16FR (CATHETERS) ×2
CATH FOLEY 2WAY SLVR 5CC 16FR (CATHETERS) ×1 IMPLANT
CHLORAPREP W/TINT 26 (MISCELLANEOUS) ×2 IMPLANT
CLIPPER FILTER TUBING DISPOS (CLIP) ×2
COVER BACK TABLE 60X90IN (DRAPES) ×2 IMPLANT
COVER MAYO STAND STRL (DRAPES) ×4 IMPLANT
COVER WAND RF STERILE (DRAPES) ×2 IMPLANT
DERMABOND ADVANCED (GAUZE/BANDAGES/DRESSINGS) ×1
DERMABOND ADVANCED .7 DNX12 (GAUZE/BANDAGES/DRESSINGS) ×1 IMPLANT
DISSECTOR ROUND CHERRY 3/8 STR (MISCELLANEOUS) IMPLANT
DRAPE INCISE IOBAN 66X45 STRL (DRAPES) ×2 IMPLANT
DRAPE LAPAROTOMY TRNSV 102X78 (DRAPES) ×2 IMPLANT
DRSG TEGADERM 4X4.75 (GAUZE/BANDAGES/DRESSINGS) ×2 IMPLANT
DRSG TELFA 3X8 NADH (GAUZE/BANDAGES/DRESSINGS) ×2 IMPLANT
ELECT REM PT RETURN 9FT ADLT (ELECTROSURGICAL) ×2
ELECTRODE REM PT RTRN 9FT ADLT (ELECTROSURGICAL) ×1 IMPLANT
FILTER TUBING DISPOS CLIPPER (CLIP) ×1 IMPLANT
GAUZE SPONGE 4X4 12PLY STRL (GAUZE/BANDAGES/DRESSINGS) ×2 IMPLANT
GLOVE SURG ENC MOIS LTX SZ8 (GLOVE) ×2 IMPLANT
GLOVE SURG POLYISO LF SZ6.5 (GLOVE) ×4 IMPLANT
GLOVE SURG POLYISO LF SZ7 (GLOVE) ×4 IMPLANT
GLOVE SURG UNDER POLY LF SZ6.5 (GLOVE) ×4 IMPLANT
GLOVE SURG UNDER POLY LF SZ7 (GLOVE) ×8 IMPLANT
GOWN STRL REUS W/TWL XL LVL3 (GOWN DISPOSABLE) ×4 IMPLANT
HOLDER FOLEY CATH W/STRAP (MISCELLANEOUS) ×2 IMPLANT
KIT TITAN ASSEMBLY (Erectile Restoration) ×2 IMPLANT
KIT TITAN ASSEMBLY STANDARD (Erectile Restoration) ×1 IMPLANT
KIT TITAN ASSEMBLY STD (Erectile Restoration) ×1 IMPLANT
KIT TURNOVER CYSTO (KITS) ×2 IMPLANT
MANIFOLD NEPTUNE II (INSTRUMENTS) ×2 IMPLANT
NS IRRIG 500ML POUR BTL (IV SOLUTION) ×4 IMPLANT
PACK BASIN DAY SURGERY FS (CUSTOM PROCEDURE TRAY) ×2 IMPLANT
PENCIL SMOKE EVACUATOR (MISCELLANEOUS) ×2 IMPLANT
PLUG CATH AND CAP STER (CATHETERS) ×2 IMPLANT
PROS TITAN INFRA 0 ANG 20CM (Erectile Restoration) ×2 IMPLANT
PROSTHESIS TTN INFR 0 ANG 20CM (Erectile Restoration) ×1 IMPLANT
RESERVOIR 75CC LOCKOUT BIOFLEX (Erectile Restoration) ×2 IMPLANT
RETRACTOR STAY HOOK 5MM (MISCELLANEOUS) IMPLANT
RETRACTOR STERILE 25.8CMX11.3 (INSTRUMENTS) IMPLANT
RETRACTOR WILSON SYSTEM (INSTRUMENTS) IMPLANT
SPONGE LAP 4X18 RFD (DISPOSABLE) ×2 IMPLANT
STRIP CLOSURE SKIN 1/2X4 (GAUZE/BANDAGES/DRESSINGS) IMPLANT
SUPPORT SCROTAL LG STRP (MISCELLANEOUS) IMPLANT
SURGILUBE 2OZ TUBE FLIPTOP (MISCELLANEOUS) ×2 IMPLANT
SUT MNCRL AB 4-0 PS2 18 (SUTURE) ×2 IMPLANT
SUT VIC AB 2-0 SH 27 (SUTURE) ×2
SUT VIC AB 2-0 SH 27XBRD (SUTURE) ×1 IMPLANT
SUT VIC AB 2-0 UR5 27 (SUTURE) ×10 IMPLANT
SUT VIC AB 5-0 PS2 18 (SUTURE) IMPLANT
SUT VICRYL 0 27 CT2 27 ABS (SUTURE) ×2 IMPLANT
SYR 10ML LL (SYRINGE) ×2 IMPLANT
SYR 20ML LL LF (SYRINGE) ×2 IMPLANT
SYR 50ML LL SCALE MARK (SYRINGE) ×4 IMPLANT
SYR BULB IRRIG 60ML STRL (SYRINGE) ×2 IMPLANT
TOWEL OR 17X26 10 PK STRL BLUE (TOWEL DISPOSABLE) ×4 IMPLANT
TRAY DSU PREP LF (CUSTOM PROCEDURE TRAY) ×2 IMPLANT
TUBE CONNECTING 12X1/4 (SUCTIONS) ×2 IMPLANT
WATER STERILE IRR 500ML POUR (IV SOLUTION) ×2 IMPLANT
YANKAUER SUCT BULB TIP NO VENT (SUCTIONS) ×2 IMPLANT

## 2020-10-03 NOTE — Interval H&P Note (Signed)
History and Physical Interval Note:  10/03/2020 7:25 AM  Danny Brewer  has presented today for surgery, with the diagnosis of ERECTILE DYSFUNCTION.  The various methods of treatment have been discussed with the patient and family. After consideration of risks, benefits and other options for treatment, the patient has consented to  Procedure(s) with comments: PLACEMENT OF PENILE PROTHESIS INFLATABLE (N/A) - 2 HRS as a surgical intervention.  The patient's history has been reviewed, patient examined, no change in status, stable for surgery.  I have reviewed the patient's chart and labs.  Questions were answered to the patient's satisfaction.     Bertram Millard Shamere Dilworth

## 2020-10-03 NOTE — Op Note (Signed)
PATIENT:  Danny Brewer  PRE-OPERATIVE DIAGNOSIS:  Organic erectile dysfunction  POST-OPERATIVE DIAGNOSIS:  Same  PROCEDURE:  Procedure(s): 3 piece inflatable penile prosthesis (Coloplast)  SURGEON:  Bertram Millard. Cristal Qadir, MD  ASST: Patience Musca, MD  INDICATION: He has had long-standing organic erectile dysfunction and has been treated with, unsuccessfully, with oral agents, vacuum erection device and intracavernosal injection therapy without success. He has elected to proceed with prosthesis implantation.  ANESTHESIA:  General  EBL:  < 100 mL  Device: 3 piece Titan: 75 cc reservoir, 20 cm cylinders and no cm rear-tip extenders   LOCAL MEDICATIONS USED:  None  SPECIMEN: None  DISPOSITION OF SPECIMEN:  N/A  Description of procedure: The patient was taken to the major operating room, placed on the table and administered general anesthesia in the supine position. His genitalia was then scrubbed for 10 minutes, painted and then sterilely draped. An official timeout was then performed.  A Toomey syringe was utilized to irrigate the urethra with Betadine.  A 16 French Foley catheter was then placed in the bladder and the bladder was drained and the catheter was plugged. A midline infrapubic incision was then made and then blunt dissection was used to expose the corpora cavernosa bilaterally. This was further cleared of overlying tissue using sharp technique. 2-0 Vicryl sutures were then placed anterolaterally in the corpus cavernosum to serve as stay sutures and then an incision was then made in the corpus cavernosum first on the left-hand side with the knife and then extended proximally and distally with the curved Mayo scissors. I then dilated the corpus cavernosum with the Mentor dilators starting at 10 and progressing to 12 proximally and distally. I then irrigated the corpus cavernosum with antibiotic solution and measured the distance proximally and distally from the stay suture and was  found to be 9 and 12 cm, respectively.I then turned my attention to the contralateral corpus cavernosum and placed my stay sutures, made my corporotomy and dilated the corpus cavernosum in an identical fashion. This was measured and also was found to be 9 cm proximally and 11 distally. It was irrigated with anastomotic solution as was the scrotum. I then chose an 20 cm cylinder set with no rear-tip extenders and these were prepped while I prepared the site for reservoir placement.  Incision was then made in the lower rectus fascia.  I  then developed a space behind the symphysis pubis for the reservoir. I irrigated the space with anastomotic solution and then placed the reservoir in this location. I then filled the reservoir with 75 cc of sterile saline.  Attention was redirected to the corporotomies where the cylinders were then placed by first fixing the suture to the distal aspect of the right cylinder to a straight needle. This was then loaded on the Centracare inserter and passed through the corporotomy and distally. I then advanced the straight needle with the Furlow inserter out through the glans and this was grasped with a hemostat and pulled through the glans and the suture was secured with a hemostat. I then performed an identical maneuver on the contralateral side. After this was performed I irrigated both corpus cavernosum and inserted the distal portion of the cylinder through the corporotomies and pulled this to the end of the corpora with the suture. The proximal aspect with the rear-tip extender was then passed through the corporotomy and into the seated position on each side. (Initially, there was crossover noted between right and left cylinders. Following close attention and  exploration as well as placement of dilators in the contralateral corporal body, cylinders were placed I their corresponding corporal body. ).  I then connected reservoir tubing to a syringe filled with sterile saline and  inflated the device. I noted a good straight erection with both cylinders equidistant under the glans and no buckling of the cylinders. I therefore deflated the device and closed the corporotomies with running 2-0 Vicryl suture. The cylinder was then connected to the pump after excising the excess tubing with appropriate shodded hemostats in place and then I used the supplied connectors to make the connection. I then again cycled the device with the pump and it cycled properly. I deflated the device and pumped it up about three quarters of the way to aid with hemostasis. I irrigated the scrotum once again with metabolic solution.  I then developed the scrotal pocket in the right hemiscrotum in place the pump in this location. I irrigated the wound one last time with antibiotic irrigation and then closed the deep scrotal tissue over the tubing and pump with running 2-0 vicryl suture. A second layer was then closed over this first layer with running 2- 0 vicryl, and running subcutcular suture w/ 4-0 monocryl performed. Incision dressed with a telfa and tegaderm.  Fluff dressing placed over scrotum/penis and supporter placed. The catheter was connected to closed system drainage and the patient was awakened and taken recovery room in stable and satisfactory condition. He tolerated the procedure well and there were no intraoperative complications. Needle sponge and instrument counts were correct at the end of the operation.

## 2020-10-03 NOTE — Anesthesia Procedure Notes (Signed)
Procedure Name: LMA Insertion Date/Time: 10/03/2020 7:39 AM Performed by: Caren Macadam, CRNA Pre-anesthesia Checklist: Patient identified, Emergency Drugs available, Suction available and Patient being monitored Patient Re-evaluated:Patient Re-evaluated prior to induction Oxygen Delivery Method: Circle system utilized Preoxygenation: Pre-oxygenation with 100% oxygen Induction Type: IV induction Ventilation: Mask ventilation without difficulty LMA: LMA inserted LMA Size: 5.0 Number of attempts: 1 Placement Confirmation: positive ETCO2 and breath sounds checked- equal and bilateral Tube secured with: Tape Dental Injury: Teeth and Oropharynx as per pre-operative assessment

## 2020-10-03 NOTE — Anesthesia Postprocedure Evaluation (Signed)
Anesthesia Post Note  Patient: Danny Brewer  Procedure(s) Performed: PLACEMENT OF PENILE PROTHESIS INFLATABLE (N/A Penis)     Patient location during evaluation: PACU Anesthesia Type: General Level of consciousness: awake and alert Pain management: pain level controlled Vital Signs Assessment: post-procedure vital signs reviewed and stable Respiratory status: spontaneous breathing, nonlabored ventilation and respiratory function stable Cardiovascular status: stable and blood pressure returned to baseline Anesthetic complications: no   No complications documented.  Last Vitals:  Vitals:   10/03/20 1053 10/03/20 1100  BP:  (!) 121/99  Pulse: 87 89  Resp: (!) 26 14  Temp:  (!) 36.4 C  SpO2: 95% 97%    Last Pain:  Vitals:   10/03/20 1053  TempSrc:   PainSc: 5                  Beryle Lathe

## 2020-10-03 NOTE — Transfer of Care (Signed)
Immediate Anesthesia Transfer of Care Note  Patient: Danny Brewer  Procedure(s) Performed: PLACEMENT OF PENILE PROTHESIS INFLATABLE (N/A Penis)  Patient Location: PACU  Anesthesia Type:General  Level of Consciousness: awake, alert  and pateint uncooperative  Airway & Oxygen Therapy: Patient Spontanous Breathing and Patient connected to face mask oxygen  Post-op Assessment: Report given to RN and Post -op Vital signs reviewed and stable  Post vital signs: Reviewed and stable  Last Vitals:  Vitals Value Taken Time  BP 121/99 10/03/20 1100  Temp 36.4 C 10/03/20 1100  Pulse 92 10/03/20 1102  Resp 26 10/03/20 1102  SpO2 99 % 10/03/20 1102  Vitals shown include unvalidated device data.  Last Pain:  Vitals:   10/03/20 1053  TempSrc:   PainSc: 5       Patients Stated Pain Goal: 6 (10/03/20 2957)  Complications: No complications documented.

## 2020-10-03 NOTE — Discharge Instructions (Signed)
Penile prosthesis postoperative instructions ° °Wound: ° °In most cases your incision will have absorbable sutures that will dissolve within the first 10-20 days. Some will fall out even earlier. Expect some redness as the sutures dissolved but this should occur only around the sutures. If there is generalized redness, especially with increasing pain or swelling, let us know. The scrotum and penis will very likely get "black and blue" as the blood in the tissues spread. Sometimes the whole scrotum will turn colors. The black and blue is followed by a yellow and brown color. In time, all the discoloration will go away. In some cases some firm swelling in the area of the testicle and pump may persist for up to 4-6 weeks after the surgery and is considered normal in most cases. ° °Diet: ° °You may return to your normal diet within 24 hours following your surgery. You may note some mild nausea and possibly vomiting the first 6-8 hours following surgery. This is usually due to the side effects of anesthesia, and will disappear quite soon. I would suggest clear liquids and a very light meal the first evening following your surgery. ° °Activity: ° °Your physical activity should be restricted the first 48 hours. During that time you should remain relatively inactive, moving about only when necessary. During the first 7-10 days following surgery he should avoid lifting any heavy objects (anything greater than 15 pounds), and avoid strenuous exercise. If you work, ask us specifically about your restrictions, both for work and home. We will write a note to your employer if needed. ° °You should plan to wear a tight pair of jockey shorts or an athletic supporter for the first 4-5 days, even to sleep. This will keep the scrotum immobilized to some degree and keep the swelling down.The position of your penis will determine what is most comfortable but I strongly urge you to keep the penis in the "up" position (toward your  head). ° °Ice packs should be placed on and off over the scrotum for the first 48 hours. Frozen peas or corn in a ZipLock bag can be frozen, used and re-frozen. Fifteen minutes on and 15 minutes off is a reasonable schedule. The ice is a good pain reliever and keeps the swelling down. ° °Hygiene: ° °You may shower 48 hours after your surgery. Tub bathing should be restricted until the seventh day. ° °Medication: ° °You will be sent home with some type of pain medication. In many cases you will be sent home with a narcotic pain pill (hydrocodone or oxycodone). If the pain is not too bad, you may take either Tylenol (acetaminophen) or Advil (ibuprofen) which contain no narcotic agents, and might be tolerated a little better, with fewer side effects. If the pain medication you are sent home with does not control the pain, you will have to let us know. Some narcotic pain medications cannot be given or refilled by a phone call to a pharmacy. ° °Problems you should report to us: ° °· Fever of 101.0 degrees Fahrenheit or greater. °· Moderate or severe swelling under the skin incision or involving the scrotum. °Drug reaction such as hives, a rash, nausea or vomiting. °

## 2020-10-04 ENCOUNTER — Encounter (HOSPITAL_BASED_OUTPATIENT_CLINIC_OR_DEPARTMENT_OTHER): Payer: Self-pay | Admitting: Urology

## 2020-10-04 DIAGNOSIS — N529 Male erectile dysfunction, unspecified: Secondary | ICD-10-CM | POA: Diagnosis not present

## 2020-10-04 LAB — BASIC METABOLIC PANEL
Anion gap: 8 (ref 5–15)
BUN: 20 mg/dL (ref 6–20)
CO2: 24 mmol/L (ref 22–32)
Calcium: 8.4 mg/dL — ABNORMAL LOW (ref 8.9–10.3)
Chloride: 107 mmol/L (ref 98–111)
Creatinine, Ser: 1.22 mg/dL (ref 0.61–1.24)
GFR, Estimated: >60 mL/min (ref >60)
Glucose, Bld: 154 mg/dL — ABNORMAL HIGH (ref 70–99)
Potassium: 3.8 mmol/L (ref 3.5–5.1)
Sodium: 139 mmol/L (ref 135–145)

## 2020-10-04 LAB — CBC
HCT: 34.2 % — ABNORMAL LOW (ref 39.0–52.0)
Hemoglobin: 10.9 g/dL — ABNORMAL LOW (ref 13.0–17.0)
MCH: 28.2 pg (ref 26.0–34.0)
MCHC: 31.9 g/dL (ref 30.0–36.0)
MCV: 88.6 fL (ref 80.0–100.0)
Platelets: 212 10*3/uL (ref 150–400)
RBC: 3.86 MIL/uL — ABNORMAL LOW (ref 4.22–5.81)
RDW: 13.2 % (ref 11.5–15.5)
WBC: 10.5 10*3/uL (ref 4.0–10.5)
nRBC: 0 % (ref 0.0–0.2)

## 2020-10-04 MED ORDER — FLUCONAZOLE 100 MG PO TABS: 100.0000 mg | ORAL_TABLET | Freq: Every day | ORAL | 0 refills | Status: DC

## 2020-10-04 MED ORDER — ACETAMINOPHEN 500 MG PO TABS
ORAL_TABLET | ORAL | Status: AC
  Filled 2020-10-04: qty 2

## 2020-10-04 MED ORDER — CIPROFLOXACIN HCL 500 MG PO TABS: 500.0000 mg | ORAL_TABLET | Freq: Two times a day (BID) | ORAL | 0 refills | Status: AC

## 2020-10-04 MED ORDER — OXYCODONE HCL 5 MG PO TABS: 5.0000 mg | ORAL_TABLET | ORAL | 0 refills | Status: DC | PRN

## 2020-10-04 MED ORDER — OXYCODONE HCL 5 MG PO TABS
ORAL_TABLET | ORAL | Status: AC
  Filled 2020-10-04: qty 2

## 2020-10-09 ENCOUNTER — Other Ambulatory Visit: Payer: Self-pay | Admitting: Cardiology

## 2020-10-09 DIAGNOSIS — I6523 Occlusion and stenosis of bilateral carotid arteries: Secondary | ICD-10-CM

## 2020-10-21 ENCOUNTER — Other Ambulatory Visit: Payer: Self-pay

## 2020-10-21 ENCOUNTER — Ambulatory Visit (HOSPITAL_COMMUNITY)
Admission: RE | Admit: 2020-10-21 | Discharge: 2020-10-21 | Disposition: A | Discharge status: Home / Self Care | Payer: BC Managed Care – PPO | Source: Ambulatory Visit | Attending: Cardiology | Admitting: Cardiology

## 2020-10-21 DIAGNOSIS — I6523 Occlusion and stenosis of bilateral carotid arteries: Secondary | ICD-10-CM | POA: Diagnosis present

## 2020-10-24 ENCOUNTER — Encounter: Payer: Self-pay | Admitting: Physician Assistant

## 2020-10-24 DIAGNOSIS — I779 Disorder of arteries and arterioles, unspecified: Secondary | ICD-10-CM | POA: Insufficient documentation

## 2020-10-28 NOTE — Progress Notes (Deleted)
Cardiology Office Note    Date:  10/28/2020   ID:  Danny Brewer, DOB 1970-11-08, MRN [id number]   PCP:  Ileana Ladd, MD   [facility]  Cardiologist:  Chrystie Nose, MD *** Advanced Practice Provider:  No care team member to display Electrophysiologist:  None   [phone]   No chief complaint on file.   History of Present Illness:  Danny Brewer is a 50 y.o. 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    Patient saw Georgie Chard, NP 08/22/2020 for preoperative clearance.  He had run out of his amlodipine 2 weeks prior and blood pressures were well controlled.  He had lost 20 pounds.    Past Medical History:  Diagnosis Date  . BPH (benign prostatic hyperplasia)   . Carotid artery disease (HCC)    [place] 5/22: Bilat 1-39   . Complication of anesthesia    Pt verble and "prophecizes" after anesthesia  . Coronary Artery Disease cardiologist--- dr cooper   Coronary CTA 09/2018: pLAD 70-90; pD1+pD2 70-90; mRCA mixed plaque - cannot rule out 70-90; FFR suggests hemodynamically significant stenosis // LHC 10/2018: 2v CAD >> s/p  CABG (L-LAD, L radial-RI, S-D2, S-RCA) with Dr. Tyrone Sage   . ED (erectile dysfunction)   . GERD    watches diet  . History of kidney stones   . Hypertension   . Mixed hyperlipidemia   . OA (osteoarthritis)    neck , back  . 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 Type 2 diabetes mellitus (HCC)    followed by pcp   (10-01-2020 checks blood sugar 3 times wkly,  fasting sugar-- 72--105)  . Wears glasses     Past Surgical History:  Procedure Laterality Date  . CORONARY ARTERY BYPASS GRAFT N/A 10/17/2018   Procedure: CORONARY ARTERY BYPASS GRAFTING (CABG), ON PUMP, TIMES FOUR, USING LIMA TO LAD, ENDOSCOPICALLY HARVESTED GREATER  SAPHENOUS VEIN TO DIAG 2, SVG TO DISTAL RCA, AND LEFT RADIAL TO RAMUS INTERMEDIUS;  Surgeon: [name], MD;  Location: [facility] OR;  Service: Open Heart Surgery;  Laterality: N/A;  . ENDOVEIN HARVEST OF GREATER SAPHENOUS VEIN Right 10/17/2018   Procedure: Mack Guise Of Greater Saphenous Vein;  Surgeon: [name], MD;  Location[facility] OR;  Service: Open Heart Surgery;  Laterality: Right;  . HAND SURGERY Left 1987   complex laceration  . INGUINAL HERNIA REPAIR Bilateral 05/27/2015   Procedure: LAPAROSCOPIC BILATERAL  INGUINAL HERNIA REPAIRS WITH MESH;  Surgeon: Karie Soda, MD;  Location: [facility] ORS;  Service: General;  Laterality: Bilateral;  . KNEE ARTHROSCOPY Left 1991  . LEFT HEART CATH AND CORONARY ANGIOGRAPHY N/A 10/14/2018   Procedure: LEFT HEART CATH AND CORONARY ANGIOGRAPHY;  Surgeon: Lyn Records, MD;  Location: [facility] INVASIVE CV LAB;  Service: Cardiovascular;  Laterality: N/A;  . PENILE PROSTHESIS IMPLANT N/A 10/03/2020   Procedure: PLACEMENT OF PENILE PROTHESIS INFLATABLE;  Surgeon: Marcine Matar, MD;  Location: [facility];  Service: Urology;  Laterality: N/A;  pubis area  . RADIAL ARTERY HARVEST Left 10/17/2018   Procedure: RADIAL ARTERY HARVEST;  Surgeon: [name], MD;  Location: [facility] OR;  Service: Open Heart Surgery;  Laterality: Left;  . SHOULDER CLOSED REDUCTION Left 09/04/2019   Procedure: CLOSED MANIPULATION SHOULDER;  Surgeon: Dannielle HuhLucey, Steve, MD;  Location: [facility] ORS;  Service: Orthopedics;  Laterality: Left;  . TEE WITHOUT CARDIOVERSION N/A 10/17/2018   Procedure: TRANSESOPHAGEAL ECHOCARDIOGRAM (TEE);  Surgeon: Delight OvensGerhardt, Edward B, MD;  [facility];  Service: Open Heart Surgery;  Laterality: N/A;    Current Medications: No outpatient medications have been marked as taking for the 10/29/20 encounter (Appointment) with Dyann KiefLenze, Meri Pelot M, PA-C.     Allergies:   Antihistamines, diphenhydramine-type; Demerol; Morphine and related; Compazine  [prochlorperazine]; Metoprolol; and Statins   Social History   Socioeconomic History  . Marital status: Married    Spouse name: Not on file  . Number of children: Not on file  . Years of education: 3314  . Highest education level: Not on file  Occupational History  . Occupation: Section a and t  Tobacco Use  . Smoking status: Never Smoker  . Smokeless tobacco: Never Used  Vaping Use  . Vaping Use: Never used  Substance and Sexual Activity  . Alcohol use: No  . Drug use: Never  . Sexual activity: Not on file  Other Topics Concern  . Not on file  Social History Narrative   Right handed   Caffeine use: 1 tea/day   Social Determinants of Health   Financial Resource Strain: Not on file  Food Insecurity: Not on file  Transportation Needs: Not on file  Physical Activity: Not on file  Stress: Not on file  Social Connections: Not on file     Family History:  The patient's ***family history includes Cancer in his father; Heart attack in his brother; Heart failure in his father; Heart murmur in his sister; Hypertension in his mother and sister; Sudden death in his brother.   ROS:   Please see the history of present illness.    ROS All other systems reviewed and are negative.   PHYSICAL EXAM:   VS:  There were no vitals taken for this visit.  Physical Exam  GEN: Well nourished, well developed, in no acute distress  HEENT: normal  Neck: no JVD, carotid bruits, or masses Cardiac:RRR; no murmurs, rubs, or gallops  Respiratory:  clear to auscultation bilaterally, normal work of breathing GI: soft, nontender, nondistended, + BS Ext: without cyanosis, clubbing, or edema, Good distal pulses bilaterally MS: no deformity or atrophy  Skin: warm and dry, no rash Neuro:  Alert and Oriented x 3, Strength and sensation are intact Psych: euthymic mood, full affect  Wt Readings from Last 3 Encounters:  10/03/20 189 lb 3.2 oz (85.8 kg)  09/24/20 194 lb (88 kg)  08/22/20 190 lb 3.2 oz (86.3  kg)      Studies/Labs Reviewed:   EKG:  EKG is*** ordered today.  The ekg ordered today demonstrates ***  Recent Labs: 04/22/2020: ALT 21 10/04/2020: BUN 20; Creatinine, Ser 1.22; Hemoglobin 10.9; Platelets 212; Potassium 3.8; Sodium 139   Lipid Panel    Component Value Date/Time   CHOL 195 09/15/2019 1237   TRIG 82 09/15/2019 1237   HDL 43 09/15/2019 1237   CHOLHDL 4.5 09/15/2019 1237   CHOLHDL 4.3 09/19/2018 0243   VLDL 13 09/19/2018 0243   LDLCALC 137 (H) 09/15/2019 1237    Additional studies/ records that were reviewed today include:  Carotid dopplers 10/21/20  Summary:  Right Carotid: Velocities in the right ICA are consistent with a 1-39%  stenosis.   Left Carotid: Velocities in the left ICA are consistent with a 1-39%  stenosis.   Vertebrals: Bilateral vertebral arteries demonstrate antegrade  flow.  Subclavians: Normal flow hemodynamics were seen in bilateral subclavian               arteries.   *See table(s) above for measurements and observations.      Electronically signed by Lorine Bears MD on 10/23/2020 at 12:02:18 PM.    Echo 09/19/2018: IMPRESSIONS    1. The left ventricle has normal systolic function, with an ejection  fraction of 60-65%. The cavity size was normal. Left ventricular diastolic  function could not be evaluated.   2. The right ventricle has normal systolc function. The cavity was  normal. There is no increase in right ventricular wall thickness.   3. Aortic valve regurgitation was not assessed by color flow Doppler.  Gated Coronary CTA 10/06/2018: IMPRESSION: 1. Coronary artery calcium score 448 Agatston units. This places the patient in the 98th percentile for age and gender, suggesting high risk of future cardiac events.   2. Suspected severe proximal LAD stenosis. There also appears to be significant disease in small-moderate D1 and D2.   3. Mid RCA is obscured by motion artifact but also has significant plaque. Possible  severe (70-90%) stenosis.   Will send for FFR.   Cardiac Cath 10/14/2018: Conclusion    Diabetic with family history of premature coronary atherosclerosis.  Atypical and typical symptoms.  Recurring episodes of chest tightness at rest which is mimicked by left and right coronary contrast injections.  Symptoms in arm and neck are more continuous, positional, and not likely ischemic.  Severe two-vessel coronary disease with segmental 90 to 95% proximal to mid LAD, segmental 80% large first diagonal that supplies the distribution of the ramus intermedius or circumflex, segmental 80% stenosis in the moderate-sized second diagonal that is contained within the LAD stenosis as a bifurcation lesion, and segmental mid to distal.  The patient is right dominant.  Circumflex is small.  Normal LVEDP   RECOMMENDATIONS:    In room consultation with [facility] CV surgeon Dr. Sheliah Plane.  After discussion with patient, his wife, and surgeon, I have decided to admit the patient and he will undergo arterial grafting to the LAD, diagonal, and probable saphenous vein grafting to the right coronary on Monday.  Decided against PCI because of the requirement for long stents in all segments and anticipated decreased durability over time given his young age and diabetes.  IV nitroglycerin, therapeutic Lovenox.  Screening COVID testing.  Sleep study as OP. Clinical history is strongly suggestive.      Risk Assessment/Calculations:   {Does this patient have ATRIAL FIBRILLATION?:[phone]}     ASSESSMENT:    1. S/P CABG (coronary artery bypass graft)   2. Essential hypertension   3. Type 2 diabetes mellitus with complication, without long-term current use of insulin (HCC)   4. Hyperlipidemia, unspecified hyperlipidemia type      PLAN:  In order of problems listed above:  CAD s/p CABG x4 09/2018,  HTN-stopped amlodipine, intolerant beta blockers  DM2  HLD statin intolerant on Praluent and  Zetia   Shared Decision Making/Informed Consent   {Are you ordering a CV Procedure (e.g. stress test, cath, DCCV, TEE, etc)?   Press F2        :[id number]}    Medication Adjustments/Labs and Tests Ordered: Current medicines are reviewed at length with the patient today.  Concerns regarding medicines are outlined above.  Medication changes, Labs and Tests ordered today are listed in the Patient Instructions below. There are no Patient Instructions on file for this visit.  Elson Clan, PA-C  10/28/2020 11:22 AM    [facility] [address] [place], [place], [place]  [address] Phone: [phone]; Fax: [phone]

## 2020-10-29 ENCOUNTER — Ambulatory Visit: Payer: BC Managed Care – PPO | Admitting: Physician Assistant

## 2020-10-29 DIAGNOSIS — Z951 Presence of aortocoronary bypass graft: Secondary | ICD-10-CM

## 2020-10-29 DIAGNOSIS — E118 Type 2 diabetes mellitus with unspecified complications: Secondary | ICD-10-CM

## 2020-10-29 DIAGNOSIS — I10 Essential (primary) hypertension: Secondary | ICD-10-CM

## 2020-10-29 DIAGNOSIS — E785 Hyperlipidemia, unspecified: Secondary | ICD-10-CM

## 2020-11-24 IMAGING — CR CHEST - 2 VIEW
2 series · 2 of 2 positions shown · non-contrast
Comparison: Chest x-ray dated 08/12/2017.

CLINICAL DATA: Chest pain, dizziness.

EXAM:
CHEST - 2 VIEW

[chest pa]
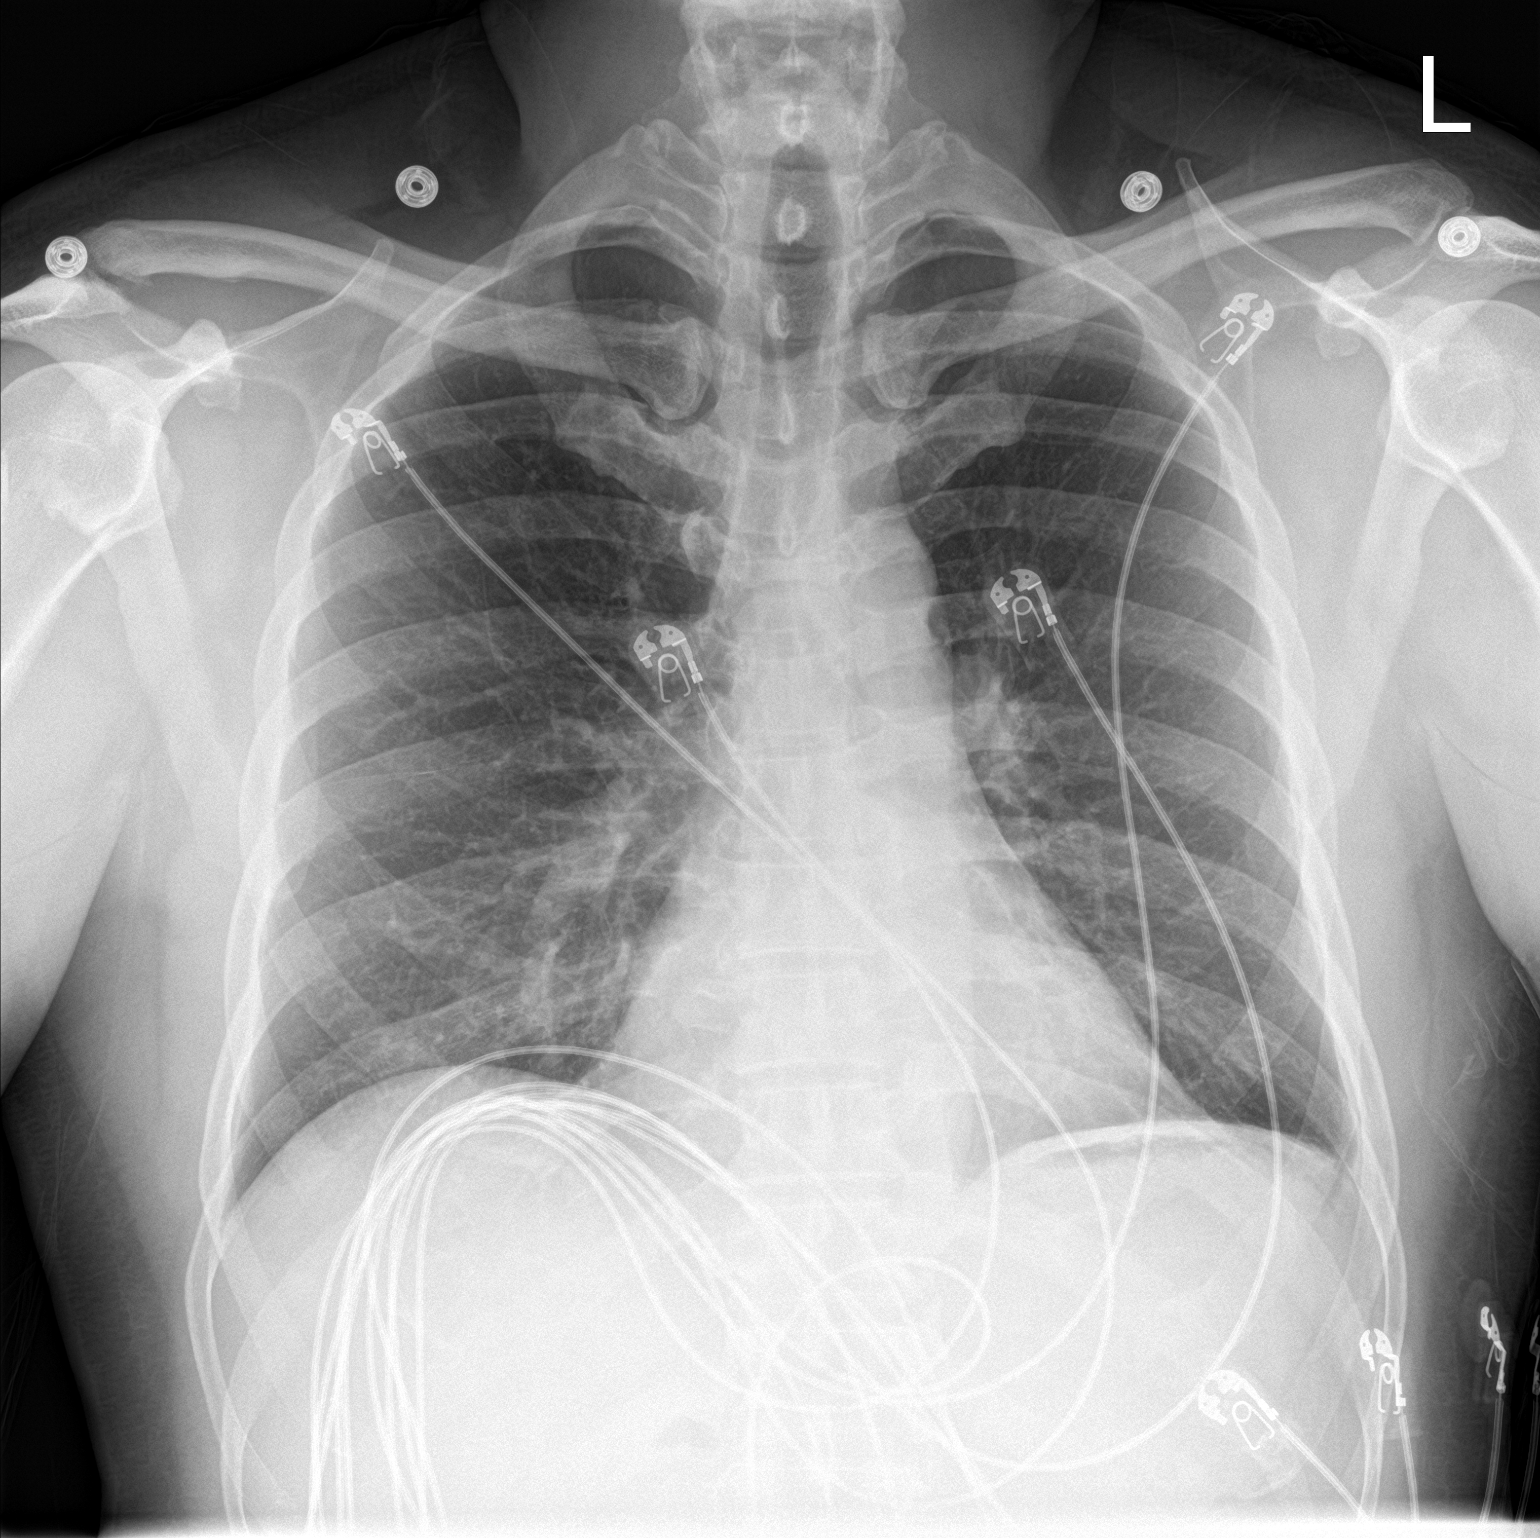

[chest lat]
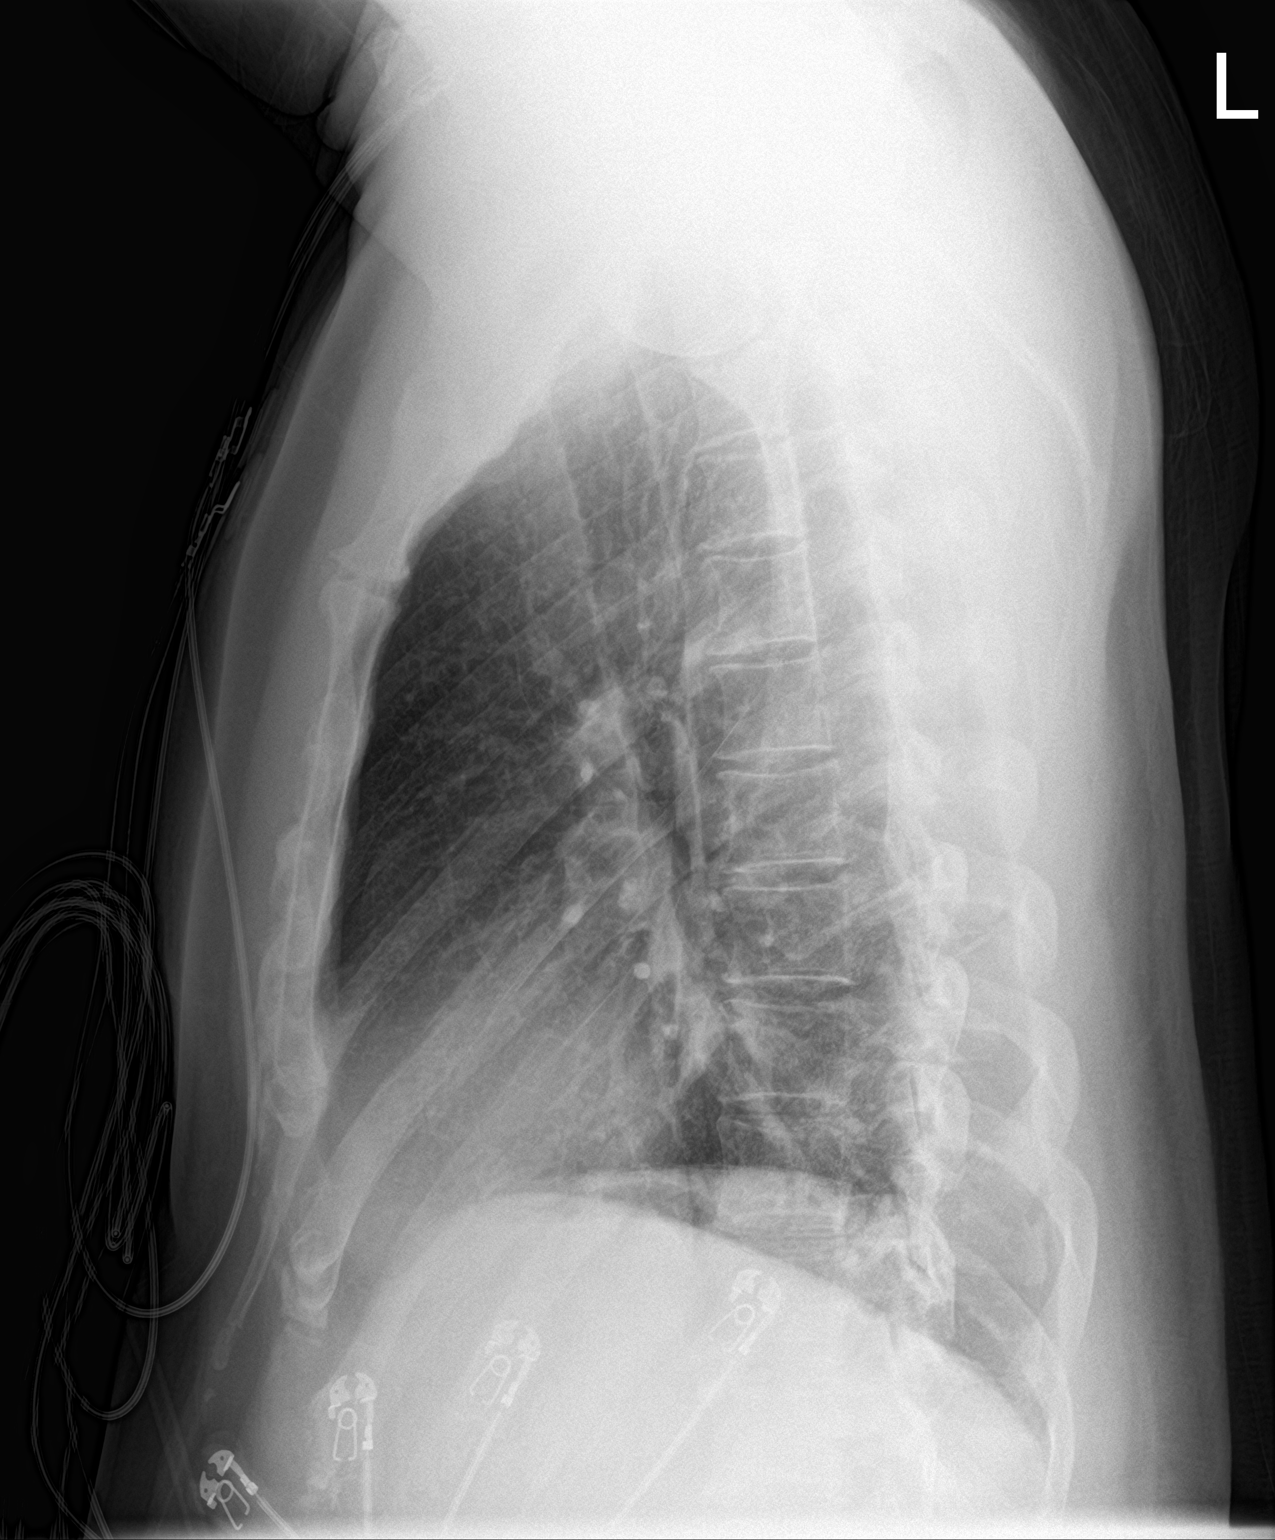

[2 of 2 positions shown; findings below may reference images not displayed]

FINDINGS: Cardiomediastinal silhouette is within normal limits in size and
configuration. Lungs are clear. Lung volumes are normal. No evidence
of pneumonia. No pleural effusion. No pneumothorax seen. Osseous
structures about the chest are unremarkable.
IMPRESSION: No active cardiopulmonary disease. No evidence of pneumonia or
pulmonary edema.

## 2020-11-26 ENCOUNTER — Telehealth: Payer: Self-pay | Admitting: Neurology

## 2020-11-26 NOTE — Telephone Encounter (Signed)
Took call from phone staff and spoke w/ pt. He has some jerking of the head and tingling this past weekend. He did a lot of work outside. Did not drink a lot of fluids. He is feeling better since the weekend Advised him to increase fluid intake to make sure he is well hydrated. He should continue to monitor sx. If sx worsen or if develops any new sx, he should let us know. He verbalized understanding and appreciation.

## 2020-11-26 NOTE — Telephone Encounter (Signed)
Pt called stating that he has been feeling jerking movements recently and is wanting to discuss with RN to see if this has anything to do with his current situation. Please advise.

## 2020-11-26 NOTE — Telephone Encounter (Signed)
LVM returning pt call. 

## 2020-12-23 IMAGING — DX PORTABLE CHEST - 1 VIEW
1 series · 1 of 1 positions shown · non-contrast
Comparison: Prior chest x-ray 10/16/2018

CLINICAL DATA: 47-year-old male status post 4 vessel CABG

EXAM:
PORTABLE CHEST 1 VIEW

[chest ap]
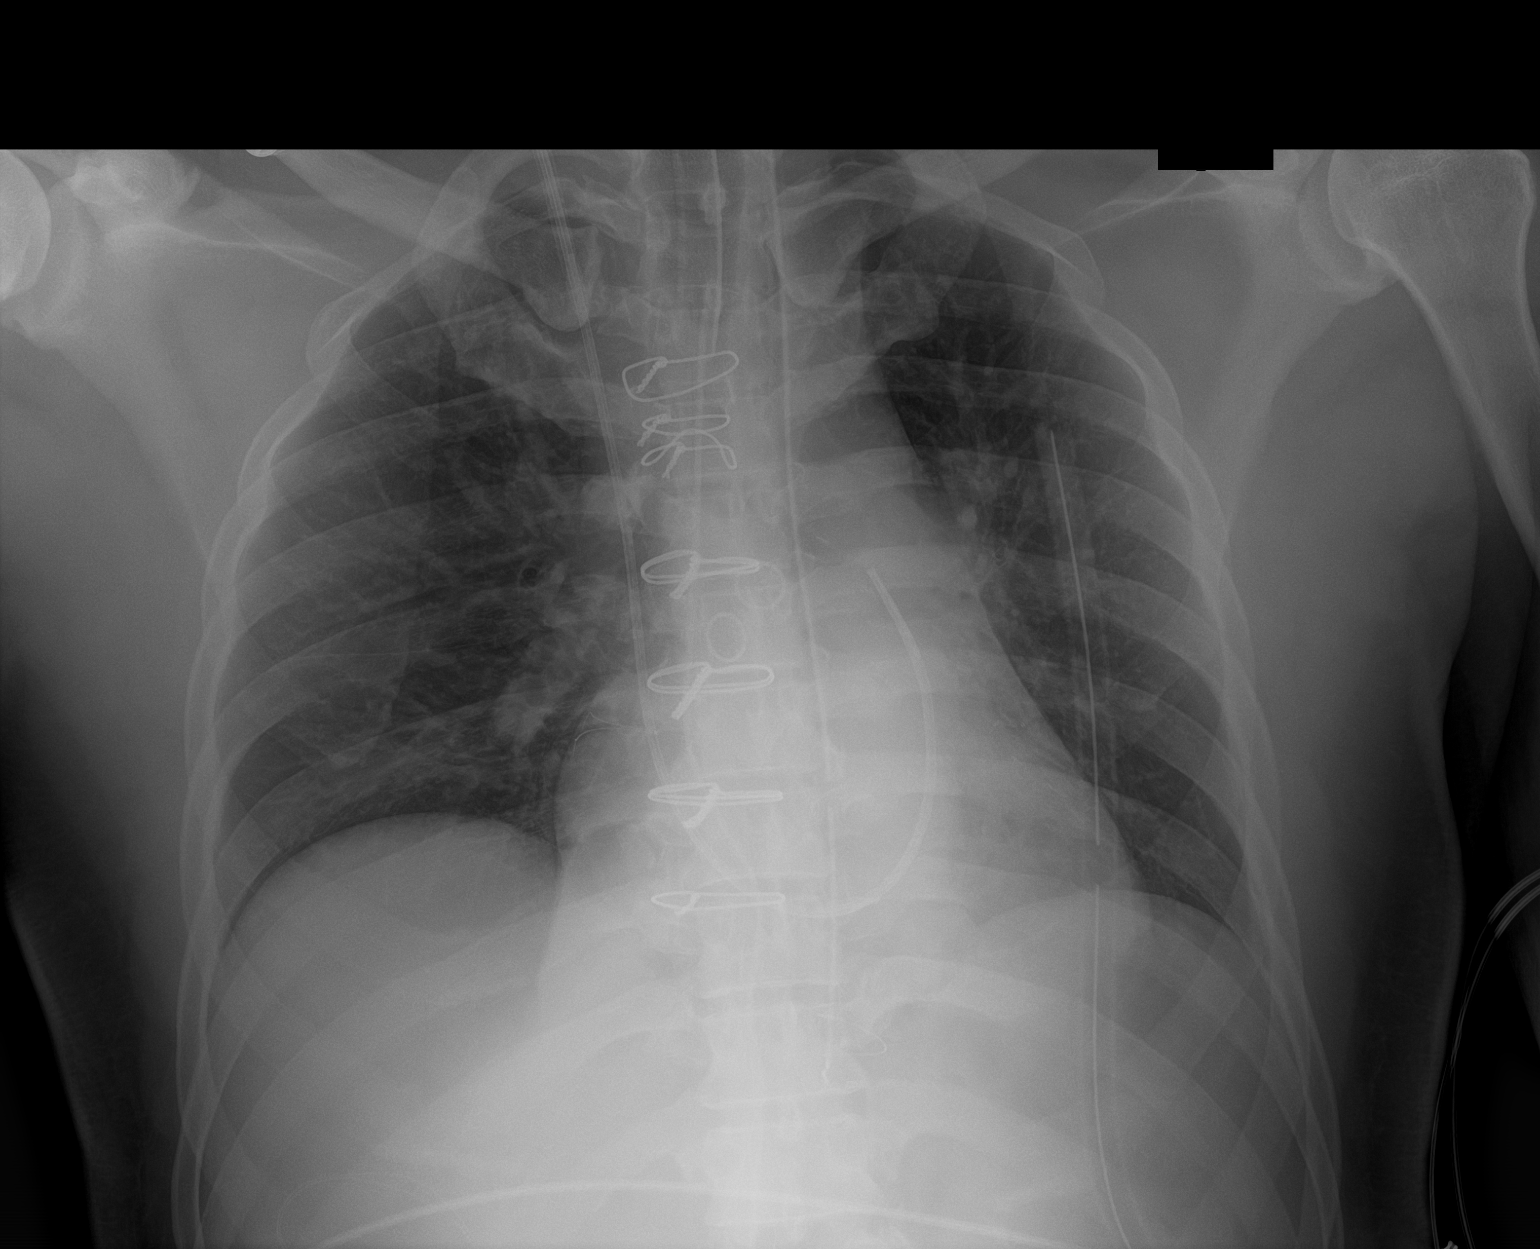

[1 of 1 positions shown; findings below may reference images not displayed]

FINDINGS: Patient is intubated. The tip of the endotracheal tube is 3.9 cm
above the carina. A right IJ vascular sheath convey is a Swan-Ganz
catheter into the heart. The tip of the Israel David overlies the main
pulmonary outflow tract. A gastric tube is present. The tip of the
tube lies in the region of the gastroesophageal junction.
Mediastinal and left-sided thoracostomy tubes are present and in
good position. The cardiac and mediastinal contours are within
normal limits. Patient is status post median sternotomy with
evidence of multivessel CABG including [REDACTED] bypass. No pulmonary
edema, pneumothorax or pleural effusion. Mild atelectasis
bilaterally.
IMPRESSION: 1. Tip of the nasogastric tube overlies the GE junction. Consider
advancing 5-7 cm for placement within the stomach.
2. The tip of the endotracheal tube is 3.9 cm above the carina.
3. The Swan-Ganz catheter overlies the main pulmonary outflow tract.
4. Mild bibasilar atelectasis without evidence of pulmonary edema,
pneumothorax or pleural effusion.

## 2020-12-29 IMAGING — CR CHEST - 2 VIEW
2 series · 2 of 2 positions shown · non-contrast
Comparison: 10/22/2018

CLINICAL DATA: Status post CABG

EXAM:
CHEST - 2 VIEW

[chest lat]
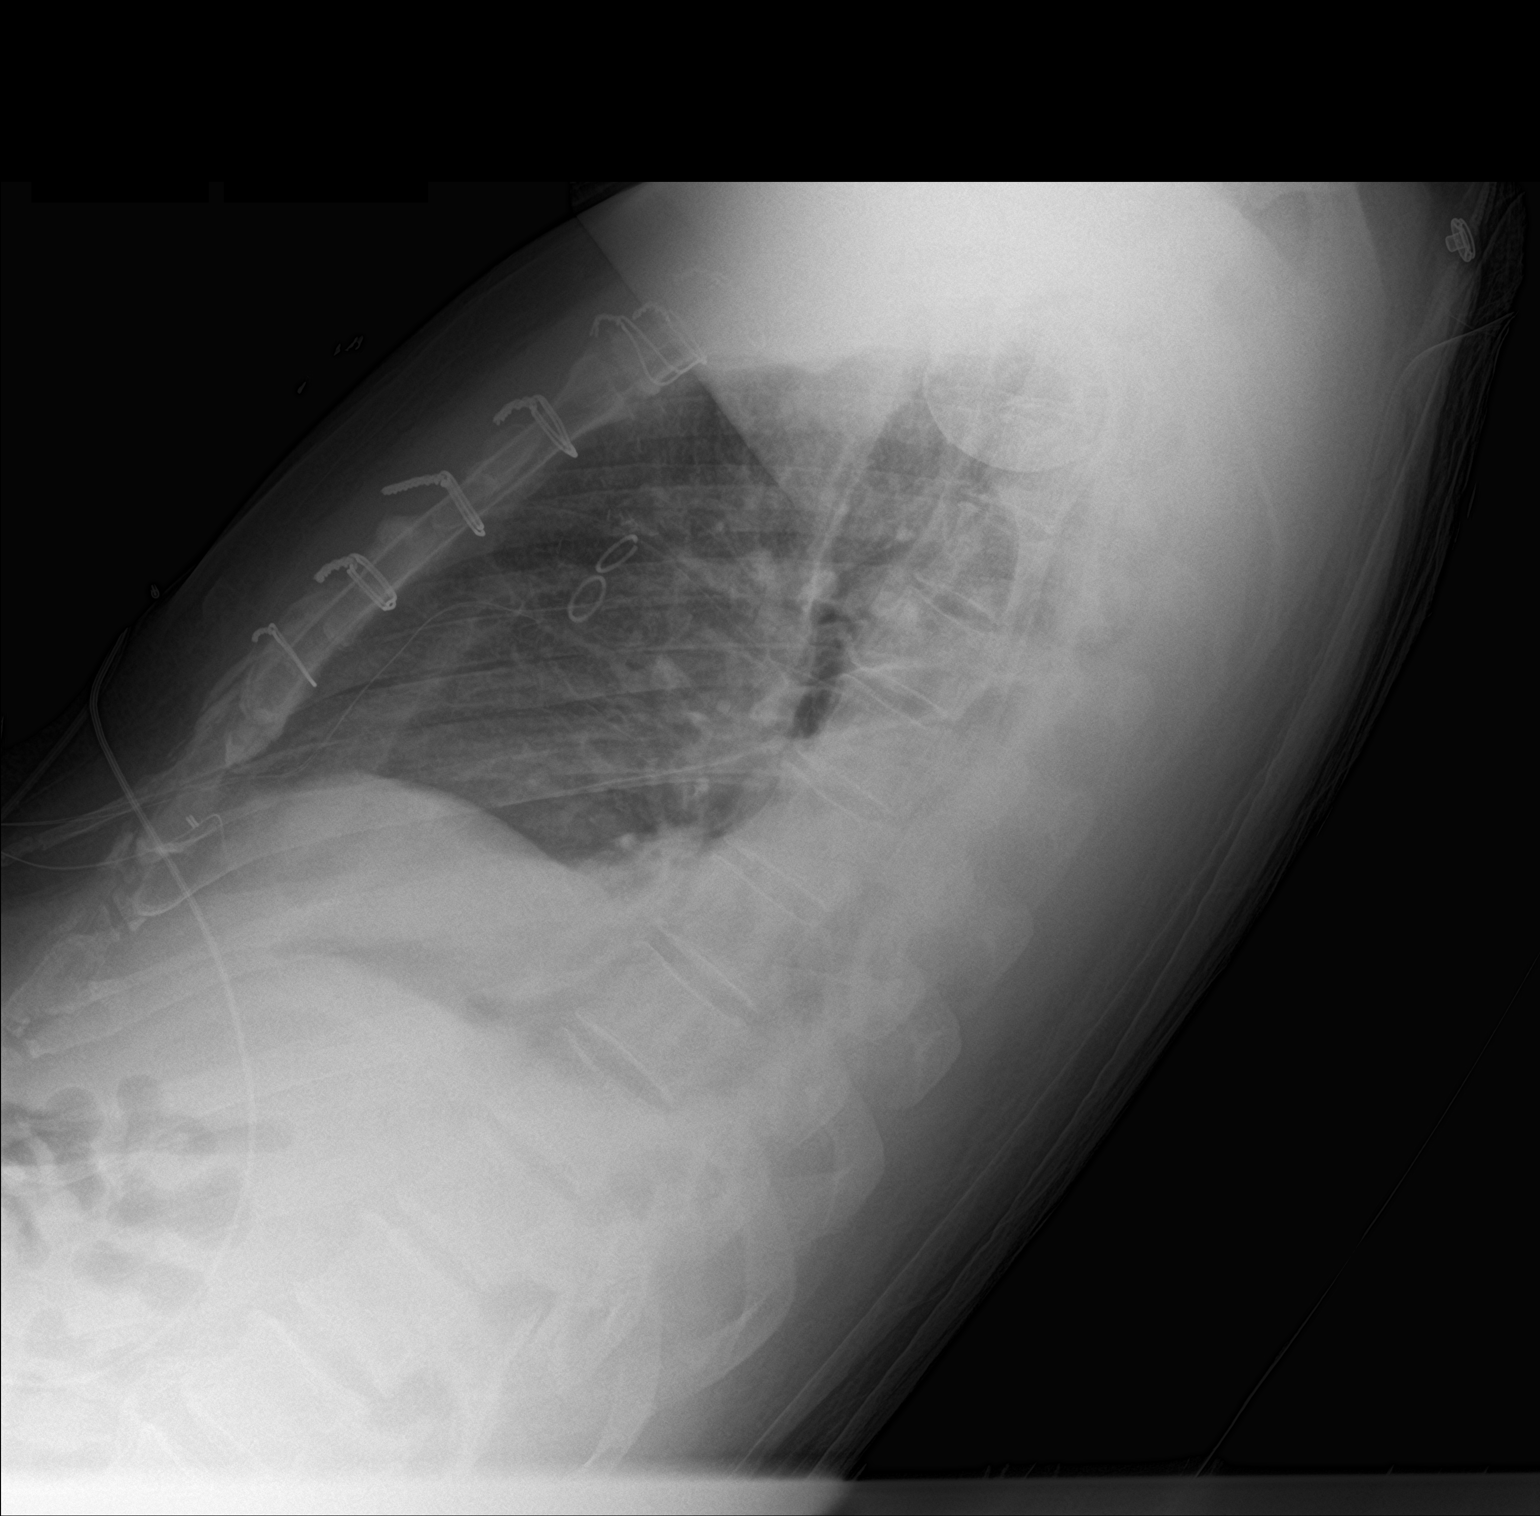

[chest ap]
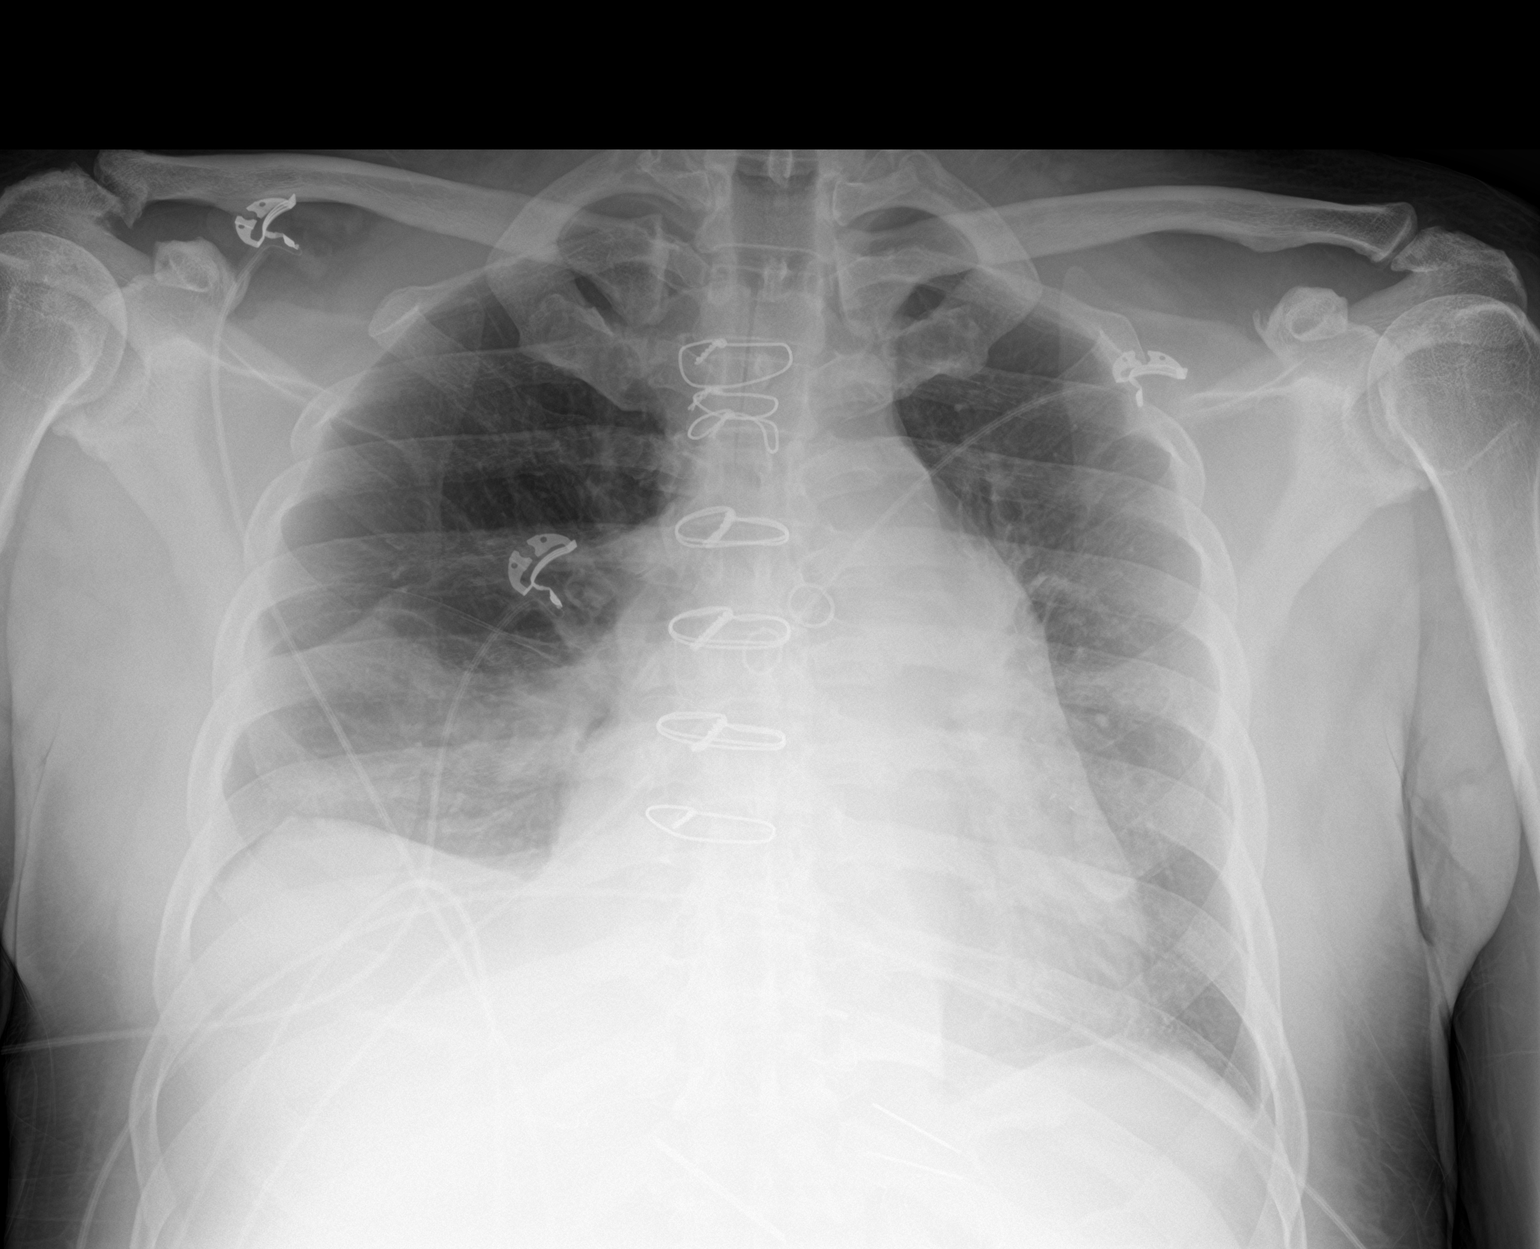

[2 of 2 positions shown; findings below may reference images not displayed]

FINDINGS: Cardiomegaly.  Postsurgical changes related to prior CABG.

Small layering right pleural effusion, improved.  No pneumothorax.

Median sternotomy.
IMPRESSION: Small layering right pleural effusion, improved.

Postsurgical changes related to prior CABG.

## 2021-01-14 ENCOUNTER — Other Ambulatory Visit: Payer: Self-pay | Admitting: Physician Assistant

## 2021-01-15 ENCOUNTER — Ambulatory Visit: Payer: BC Managed Care – PPO | Admitting: Internal Medicine

## 2021-01-16 ENCOUNTER — Ambulatory Visit: Payer: BC Managed Care – PPO | Admitting: Medical

## 2021-01-20 ENCOUNTER — Telehealth: Payer: Self-pay | Admitting: Cardiovascular Disease

## 2021-01-20 DIAGNOSIS — I25119 Atherosclerotic heart disease of native coronary artery with unspecified angina pectoris: Secondary | ICD-10-CM

## 2021-01-20 NOTE — Telephone Encounter (Signed)
Returned call to Pt.  Per Pt he had chest pain that started last Saturday.  States he was working and felt a "hot rush and headache" with chest pain that lasted 10-15 minutes.    Pt states he "has a lot going on, but this felt unusual".  States chest pain has continued and varies from 4-7 out of 10.  He would like an appointment today or tomorrow to get his heart checked out.  Advised would send to Dr. Excell Seltzer to review.  Per Dr. Beola Cord EKG nurse visit and troponin.  Pt aware.  Pt scheduled for 01/21/21 at 11:00 am.

## 2021-01-20 NOTE — Telephone Encounter (Signed)
Pt c/o of Chest Pain: STAT if CP now or developed within 24 hours  1. Are you having CP right now? no  2. Are you experiencing any other symptoms (ex. SOB, nausea, vomiting, sweating)? Got SOB when walking Saturday, got nausea and sweaty  3. How long have you been experiencing CP? Happened Saturday  4. Is your CP continuous or coming and going? Came and went  5. Have you taken Nitroglycerin? no ?   Patient states he had chest pain on the left side Saturday and got SOB when walking. He says he was also nauseas and sweating at the time of the episode. He states he has not had any symptoms since and has none now. He states he was tested for covid and was negative. He also states he has had a quadruple bypass in the past.

## 2021-01-21 ENCOUNTER — Ambulatory Visit (INDEPENDENT_AMBULATORY_CARE_PROVIDER_SITE_OTHER): Payer: BC Managed Care – PPO | Admitting: Internal Medicine

## 2021-01-21 ENCOUNTER — Other Ambulatory Visit: Payer: Self-pay

## 2021-01-21 ENCOUNTER — Other Ambulatory Visit: Payer: BC Managed Care – PPO | Admitting: *Deleted

## 2021-01-21 DIAGNOSIS — R079 Chest pain, unspecified: Secondary | ICD-10-CM

## 2021-01-21 DIAGNOSIS — I25119 Atherosclerotic heart disease of native coronary artery with unspecified angina pectoris: Secondary | ICD-10-CM

## 2021-01-21 LAB — TROPONIN T: Troponin T (Highly Sensitive): 7 ng/L (ref 0–22)

## 2021-01-21 NOTE — Progress Notes (Signed)
Cardiology Office Note   Date:  01/21/2021   ID:  Danny Brewer, DOB 16-Mar-1971, MRN [id number]  PCP:  Ileana Ladd, MD  Cardiologist:   Dietrich Pates, MD   Pt presents for evaluation of CP    History of Present Illness: Danny Brewer is a 50 y.o. male with a history of CAD   Followed by [facility]   He is s/p CABG x 4 in APril 2020   Also has a hx of DM, HTN and HL The pt called in yesterday with complaints of CP   Told to come in for EKG and labs Per the pt  he says on Saturday he had breakfast  Felt OK   Went for a 1/2 mild walk   Started to feel pressure in chest   Belched at lot  Got better   He says he does note more reflux recently, associated with food, not activity   Was given Rx for acid inhibitor but has not taken yet Sunday again felt like he had reflux   Again after eating     He has had some L parasternal CP   Sharp, lower   This is sharp, worse with deep breath or when moving L arm, shoulder   Not he had shoulder surgery 1 year ago    Als ohas some L neck pain  Pt says he was anxious about this   Wife had him call  Prior to this had been doing OK         No outpatient medications have been marked as taking for the 01/21/21 encounter (Appointment) with Pricilla Riffle, MD.     Allergies:   Antihistamines, diphenhydramine-type; Demerol; Morphine and related; Compazine [prochlorperazine]; Metoprolol; and Statins   Past Medical History:  Diagnosis Date   BPH (benign prostatic hyperplasia)    Carotid artery disease (HCC)    [place] 5/22: Bilat 1-39    Complication of anesthesia    Pt verble and "prophecizes" after anesthesia   Coronary Artery Disease cardiologist--- dr cooper   Coronary CTA 09/2018: pLAD 70-90; pD1+pD2 70-90; mRCA mixed plaque - cannot rule out 70-90; FFR suggests hemodynamically significant stenosis // LHC 10/2018: 2v CAD >> s/p  CABG (L-LAD, L radial-RI, S-D2, S-RCA) with Dr. 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   LIMA--LAD;  SVG--D2;  SVG--dRCA;  left radial --- RI   Type 2 diabetes mellitus (HCC)    followed by pcp   (10-01-2020 checks blood sugar 3 times wkly,  fasting sugar-- 72--105)   Wears glasses     Past Surgical History:  Procedure Laterality Date   CORONARY ARTERY BYPASS GRAFT N/A 10/17/2018   Procedure: CORONARY ARTERY BYPASS GRAFTING (CABG), ON PUMP, TIMES FOUR, USING LIMA TO LAD, ENDOSCOPICALLY HARVESTED GREATER SAPHENOUS VEIN TO DIAG 2, SVG TO DISTAL RCA, AND LEFT RADIAL TO RAMUS INTERMEDIUS;  Surgeon: [name], MD;  Location: [facility] OR;  Service: Open Heart Surgery;  Laterality: N/A;   ENDOVEIN HARVEST OF GREATER SAPHENOUS VEIN Right 10/17/2018   Procedure: Mack Guise Of Greater Saphenous Vein;  Surgeon: [name], MD;  Location: [facility] OR;  Service: Open Heart Surgery;  Laterality: Right;   HAND SURGERY Left 1987   complex laceration  INGUINAL HERNIA REPAIR Bilateral 05/27/2015   Procedure: LAPAROSCOPIC BILATERAL  INGUINAL HERNIA REPAIRS WITH MESH;  Surgeon: Karie Soda, MD;  Location: [facility];  Service: General;  Laterality: Bilateral;   KNEE ARTHROSCOPY Left 1991   LEFT HEART CATH AND CORONARY ANGIOGRAPHY N/A 10/14/2018   Procedure: LEFT HEART CATH AND CORONARY ANGIOGRAPHY;  Surgeon: Lyn Records, MD;  Location: [facility];  Service: Cardiovascular;  Laterality: N/A;   PENILE PROSTHESIS IMPLANT N/A 10/03/2020   Procedure: PLACEMENT OF PENILE PROTHESIS INFLATABLE;  Surgeon: Marcine Matar, MD;  Location: [facility];  Service: Urology;  Laterality: N/A;  pubis area   RADIAL ARTERY HARVEST Left 10/17/2018   Procedure: RADIAL ARTERY HARVEST;  Surgeon: [name], MD;  Location: [facility] OR;  Service: Open Heart Surgery;  Laterality: Left;   SHOULDER CLOSED REDUCTION Left 09/04/2019   Procedure: CLOSED MANIPULATION  SHOULDER;  Surgeon: Dannielle Huh, MD;  Location: [facility];  Service: Orthopedics;  Laterality: Left;   TEE WITHOUT CARDIOVERSION N/A 10/17/2018   Procedure: TRANSESOPHAGEAL ECHOCARDIOGRAM (TEE);  Surgeon: [name], MD;  Location: [facility] OR;  Service: Open Heart Surgery;  Laterality: N/A;     Social History:  The patient  reports that he has never smoked. He has never used smokeless tobacco. He reports that he does not drink alcohol and does not use drugs.   Family History:  The patient's family history includes Cancer in his father; Heart attack in his brother; Heart failure in his father; Heart murmur in his sister; Hypertension in his mother and sister; Sudden death in his brother.    ROS:  Please see the history of present illness. All other systems are reviewed and  Negative to the above problem except as noted.    PHYSICAL EXAM: VS:  BP 153/95   P 90  Wt 195 GEN: Well nourished, well developed, in no acute distress  HEENT: normal  Neck: no JVD, carotid bruits Cardiac: RRR; no murmurs, rubs, or gallops,no edema  Respiratory:  clear to auscultation bilaterally,  No wheezes or rales CHest   Tender to palpation low L parasternal region GI: soft, nontender, nondistended, + BS  No hepatomegaly  MS: no deformity Moving all extremities   Skin: warm and dry, no rash Neuro:  Strength and sensation are intact Psych: euthymic mood, full affect   EKG:  EKG is ordered today.R 88 bpm  rSR" V1, V2   Lipid Panel    Component Value Date/Time   CHOL 195 09/15/2019 1237   TRIG 82 09/15/2019 1237   HDL 43 09/15/2019 1237   CHOLHDL 4.5 09/15/2019 1237   CHOLHDL 4.3 09/19/2018 0243   VLDL 13 09/19/2018 0243   LDLCALC 137 (H) 09/15/2019 1237      Wt Readings from Last 3 Encounters:  10/03/20 189 lb 3.2 oz (85.8 kg)  09/24/20 194 lb (88 kg)  08/22/20 190 lb 3.2 oz (86.3 kg)      ASSESSMENT AND PLAN:  1  Chest discomfort   The pt has 2 types   One (L parasternal) appears muscular,  worse with movement, palpation.  The second (substernal pressure) does sound like it is due to reflux   Not provoked necessarily by aerobic activity    Labs were done today   I will review I have recomm that he take the GI meds and follow  If symptoms change or if labs abnormal will changes plans  2 CAD   CABG in 2020   Follow  As noted above  3  HTN  BP is elevated today   He does seem very anxious, at least early in visit   Will need to be followe   4   GERD  AS above  Rx    5  CV dz   Mild   6  HL  On Praluent  Will forward back to [facility] Asked pt to call if symtpoms change/ worsen   Current medicines are reviewed at length with the patient today.  The patient does not have concerns regarding medicines.  Signed, Dietrich Pates, MD  01/21/2021 7:16 PM    [facility] [address] [place], [place], [place]  [address] Phone: [phone]; Fax: [phone]

## 2021-01-27 NOTE — Addendum Note (Signed)
Addended by: Alois Cliche on: 01/27/2021 04:49 PM   Modules accepted: Orders

## 2021-02-21 ENCOUNTER — Other Ambulatory Visit: Payer: Self-pay

## 2021-02-21 ENCOUNTER — Ambulatory Visit (INDEPENDENT_AMBULATORY_CARE_PROVIDER_SITE_OTHER): Payer: BC Managed Care – PPO | Admitting: Internal Medicine

## 2021-02-21 ENCOUNTER — Encounter: Payer: Self-pay | Admitting: Internal Medicine

## 2021-02-21 VITALS — BP 132/92 | HR 71 | Ht 69.0 in | Wt 195.8 lb

## 2021-02-21 DIAGNOSIS — I10 Essential (primary) hypertension: Secondary | ICD-10-CM

## 2021-02-21 DIAGNOSIS — Z951 Presence of aortocoronary bypass graft: Secondary | ICD-10-CM | POA: Diagnosis not present

## 2021-02-21 DIAGNOSIS — E785 Hyperlipidemia, unspecified: Secondary | ICD-10-CM | POA: Diagnosis not present

## 2021-02-21 DIAGNOSIS — E7841 Elevated Lipoprotein(a): Secondary | ICD-10-CM

## 2021-02-21 DIAGNOSIS — I2510 Atherosclerotic heart disease of native coronary artery without angina pectoris: Secondary | ICD-10-CM

## 2021-02-21 MED ORDER — PRALUENT 75 MG/ML ~~LOC~~ SOAJ: 75.0000 mg | Freq: Once | SUBCUTANEOUS | 3 refills | Status: AC

## 2021-02-21 NOTE — Patient Instructions (Signed)
Medication Instructions:  CONTINUE current medications  *If you need a refill on your cardiac medications before your next appointment, please call your pharmacy*   Lab Work: FASTING lab work to check cholesterol  -- NMR lipoprofile, LP(a)  If you have labs (blood work) drawn today and your tests are completely normal, you will receive your results only by: MyChart Message (if you have MyChart) OR A paper copy in the mail If you have any lab test that is abnormal or we need to change your treatment, we will call you to review the results.   Testing/Procedures: NONE   Follow-Up: At [facility], you and your health needs are our priority.  As part of our continuing mission to provide you with exceptional heart care, we have created designated Provider Care Teams.  These Care Teams include your primary Cardiologist (physician) and Advanced Practice Providers (APPs -  Physician Assistants and Nurse Practitioners) who all work together to provide you with the care you need, when you need it.  We recommend signing up for the patient portal called "MyChart".  Sign up information is provided on this After Visit Summary.  MyChart is used to connect with patients for Virtual Visits (Telemedicine).  Patients are able to view lab/test results, encounter notes, upcoming appointments, etc.  Non-urgent messages can be sent to your provider as well.   To learn more about what you can do with MyChart, go to [email or web address].    Your next appointment:   12 month(s) - lipid clinic  The format for your next appointment:   In Person  Provider:   K. Italy Hilty, MD   Other Instructions

## 2021-02-21 NOTE — Progress Notes (Signed)
LIPID CLINIC CONSULT NOTE  Chief Complaint:  Follow-up dyslipidemia  Primary Care Physician: Ileana Ladd, MD  Primary Cardiologist:  Chrystie Nose, MD  HPI:  Jacques Fife is a 50 y.o. male who is being seen today for the evaluation of dyslipidemia at the request of Ileana Ladd, MD. 50 y.o. male who presents via audio/video conferencing for a telehealth visit today.  Mr. Chiu is a pleasant 50 year old male who unfortunately had early onset heart disease.  He also had a brother who had a heart attack in his 30s.  Unfortunately he underwent four-vessel bypass surgery in April 2020.  He also has carotid artery disease, type 2 diabetes, hypertension and other cardiovascular risk factors.  He has been unable to tolerate statins in the past due to myalgias and has been managed on ezetimibe.  More recently his lipid showed a total cholesterol 209, triglycerides 72, HDL 47 and LDL 149.  His target LDL is less than 70.  Although he does have a dyslipidemia, it is not the marked dyslipidemia I would expect with familial hyperlipidemia.  He also does not have evidence of a mixed dyslipidemia which we typically see with patients that have diabetes.  We talked about options to help him reach his target LDL including dietary changes and weight loss which she is already involved in.  He seems highly motivated to be healthier.  With that being said, I think is a good candidate for PCSK9 inhibitor.  01/01/2020  Mr. Milks is seen today in follow-up.  I last saw him via telemedicine visit in April.  He underwent repeat lipid testing which showed persistent dyslipidemia above target LDL of 70 however his LP(a) was also significantly elevated at 357.  He was approved for Praluent however unfortunately did not get the medicine until just last week.  He has not started it yet since he wanted instructions on how to inject it.  I also discussed the clinical trial that is [facility] enrolling [facility] for antisense therapy of LP(a).  02/21/2021  Mr. Fulghum returns today for follow-up.  He says overall he is doing well.  He denies any chest pain or worsening shortness of breath.  He is made some dietary changes and lost further weight.  He is taking Praluent which she seems to be tolerating.  He was seen in August of this year by Dr. Tenny Craw for atypical chest pain.  He had a troponin performed which was negative.  It was felt to be noncardiac.  He says he has not had any more those symptoms.  He is eager to get his lipids retested.  He does have a high LP(a) in the past however he would not qualify for the LP(a) antisense oligonucleotide trials due to the fact that he has not had a prior MI, despite the fact that he does have coronary disease and bypass.  PMHx:  Past Medical History:  Diagnosis Date   BPH (benign prostatic hyperplasia)    Carotid artery disease (HCC)    [place] 5/22: Bilat 1-39    Complication of anesthesia    Pt verble and "prophecizes" after anesthesia   Coronary Artery Disease cardiologist--- dr cooper   Coronary CTA 09/2018: pLAD 70-90; pD1+pD2 70-90; mRCA mixed plaque - cannot rule out 70-90; FFR suggests hemodynamically significant stenosis // LHC 10/2018: 2v CAD >> s/p  CABG (L-LAD, L radial-RI, S-D2, S-RCA) with Dr. 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   LIMA--LAD;  SVG--D2;  SVG--dRCA;  left radial --- RI   Type 2 diabetes mellitus (HCC)    followed by pcp   (10-01-2020 checks blood sugar 3 times wkly,  fasting sugar-- 72--105)   Wears glasses     Past Surgical History:  Procedure Laterality Date   CORONARY ARTERY BYPASS GRAFT N/A 10/17/2018   Procedure: CORONARY ARTERY BYPASS GRAFTING (CABG), ON PUMP, TIMES FOUR, USING LIMA TO LAD, ENDOSCOPICALLY HARVESTED GREATER SAPHENOUS VEIN TO DIAG 2, SVG  TO DISTAL RCA, AND LEFT RADIAL TO RAMUS INTERMEDIUS;  Surgeon: [name], MD;  Location: [facility] OR;  Service: Open Heart Surgery;  Laterality: N/A;   ENDOVEIN HARVEST OF GREATER SAPHENOUS VEIN Right 10/17/2018   Procedure: Mack Guise Of Greater Saphenous Vein;  Surgeon: [name], MD;  Location: [facility] OR;  Service: Open Heart Surgery;  Laterality: Right;   HAND SURGERY Left 1987   complex laceration   INGUINAL HERNIA REPAIR Bilateral 05/27/2015   Procedure: LAPAROSCOPIC BILATERAL  INGUINAL HERNIA REPAIRS WITH MESH;  Surgeon: Karie Soda, MD;  Location: [facility];  Service: General;  Laterality: Bilateral;   KNEE ARTHROSCOPY Left 1991   LEFT HEART CATH AND CORONARY ANGIOGRAPHY N/A 10/14/2018   Procedure: LEFT HEART CATH AND CORONARY ANGIOGRAPHY;  Surgeon: Lyn Records, MD;  Location: [facility];  Service: Cardiovascular;  Laterality: N/A;   PENILE PROSTHESIS IMPLANT N/A 10/03/2020   Procedure: PLACEMENT OF PENILE PROTHESIS INFLATABLE;  Surgeon: Marcine Matar, MD;  Location: [facility];  Service: Urology;  Laterality: N/A;  pubis area   RADIAL ARTERY HARVEST Left 10/17/2018   Procedure: RADIAL ARTERY HARVEST;  Surgeon: [name], MD;  Location: [facility] OR;  Service: Open Heart Surgery;  Laterality: Left;   SHOULDER CLOSED REDUCTION Left 09/04/2019   Procedure: CLOSED MANIPULATION SHOULDER;  Surgeon: Dannielle Huh, MD;  Location: [facility];  Service: Orthopedics;  Laterality: Left;   TEE WITHOUT CARDIOVERSION N/A 10/17/2018   Procedure: TRANSESOPHAGEAL ECHOCARDIOGRAM (TEE);  Surgeon: [name], MD;  Location: [facility] OR;  Service: Open Heart Surgery;  Laterality: N/A;    FAMHx:  Family History  Problem Relation Age of Onset   Hypertension Mother    Cancer Father    Heart failure Father    Sudden death Brother    Heart attack Brother    Hypertension Sister    Heart murmur Sister     SOCHx:   reports that he has never smoked. He has never used  smokeless tobacco. He reports that he does not drink alcohol and does not use drugs.  ALLERGIES:  Allergies  Allergen Reactions   Antihistamines, Diphenhydramine-Type Hives and Other (See Comments)    Seizures   Demerol Hives   Morphine And Related Hives    Esp with high dose morphine, possibly Dilaudid Tolerates hydrocodone, oxycodone   Compazine [Prochlorperazine] Other (See Comments)    Spastic movement   Metoprolol     Per patient caused "severe headache"   Statins Other (See Comments)    myalgias    ROS: Pertinent items noted in HPI and remainder of comprehensive ROS otherwise negative.  HOME MEDS: Current Outpatient Medications on File Prior to Visit  Medication Sig Dispense Refill   acetaminophen (TYLENOL) 325 MG tablet Take 650 mg  by mouth every 6 (six) hours as needed for moderate pain.     Alirocumab (PRALUENT) 75 MG/ML SOAJ Inject 75 mg into the skin once. Every 15 days     amLODipine (NORVASC) 5 MG tablet Take 1 tablet (5 mg total) by mouth daily. (Patient taking differently: Take 5 mg by mouth daily.) 90 tablet 3   aspirin 81 MG tablet Take 1 tablet (81 mg total) by mouth daily.     ezetimibe (ZETIA) 10 MG tablet TAKE 1 TABLET BY MOUTH EVERY DAY (Patient taking differently: Take 10 mg by mouth.) 90 tablet 3   fluconazole (DIFLUCAN) 100 MG tablet Take 1 tablet (100 mg total) by mouth daily. X 7 days 7 tablet 0   oxyCODONE (ROXICODONE) 5 MG immediate release tablet Take 1 tablet (5 mg total) by mouth every 4 (four) hours as needed for severe pain. 15 tablet 0   sitaGLIPtin (JANUVIA) 100 MG tablet Take 100 mg by mouth daily.     tamsulosin (FLOMAX) 0.4 MG CAPS capsule Take 0.4 mg by mouth daily.     No current facility-administered medications on file prior to visit.    LABS/IMAGING: No results found for this or any previous visit (from the past 48 hour(s)). No results found.  LIPID PANEL:    Component Value Date/Time   CHOL 195 09/15/2019 1237   TRIG 82  09/15/2019 1237   HDL 43 09/15/2019 1237   CHOLHDL 4.5 09/15/2019 1237   CHOLHDL 4.3 09/19/2018 0243   VLDL 13 09/19/2018 0243   LDLCALC 137 (H) 09/15/2019 1237    WEIGHTS: Wt Readings from Last 3 Encounters:  02/21/21 195 lb 12.8 oz (88.8 kg)  10/03/20 189 lb 3.2 oz (85.8 kg)  09/24/20 194 lb (88 kg)    VITALS: BP (!) 132/92   Pulse 71   Ht 5\' 9"  (1.753 m)   Wt 195 lb 12.8 oz (88.8 kg)   SpO2 99%   BMI 28.91 kg/m   EXAM: General appearance: alert and no distress Neck: no carotid bruit, no JVD, and thyroid not enlarged, symmetric, no tenderness/mass/nodules Lungs: clear to auscultation bilaterally Heart: regular rate and rhythm, S1, S2 normal, no murmur, click, rub or gallop Abdomen: soft, non-tender; bowel sounds normal; no masses,  no organomegaly Extremities: extremities normal, atraumatic, no cyanosis or edema Pulses: 2+ and symmetric Skin: Skin color, texture, turgor normal. 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   Mr. Ganaway seems to be doing well and has made dietary changes to cut back on his cholesterol and lost some weight.  He is tolerating the Praluent and is due for repeat lipids.  We will order those although he is not fasting today.  He should continue ezetimibe for now but may be able to come off of that if his cholesterol is very low.  Blood pressure is good today.  He had recent work-up for chest pain which is considered atypical.  He is only 2 years out from CABG.  A1c 6.4 in March of this year.  His LDL was 92 and therefore was placed on the Praluent.  Plan follow-up with me annually or sooner as necessary.  April, MD, [facility], FACP  [place]  [facility]  Medical Director of the Advanced Lipid Disorders  &  Cardiovascular Risk  Reduction Clinic Diplomate of the American Board of Clinical Lipidology Attending Cardiologist  Direct Dial: [phone]  Fax: [phone]  Website:  [email or web address] Nicely Ruby Dilone 02/21/2021, 11:55 AM

## 2021-02-24 ENCOUNTER — Other Ambulatory Visit: Payer: Self-pay | Admitting: Cardiovascular Disease

## 2021-02-27 ENCOUNTER — Telehealth: Payer: Self-pay | Admitting: *Deleted

## 2021-02-27 NOTE — Telephone Encounter (Signed)
Ok to hold aspirin 7 days prior to procedure if necessary, restart after  Dr Rexene Edison

## 2021-02-27 NOTE — Telephone Encounter (Signed)
    Patient Name: Danny Brewer  DOB: [birth year] MRN: [id number]  Primary Cardiologist: Chrystie Nose, MD  Chart reviewed as part of pre-operative protocol coverage.   The patient was doing well on cardiac stand point when seen by Dr. Rennis Golden 02/21/21. Given past medical history and time since last visit, based on ACC/AHA guidelines, Andrus Sharp would be at acceptable risk for the planned procedure without further cardiovascular testing.   Dr. Rennis Golden, can pt hold ASA and how long? Please forward your response to P CV DIV PREOP.   Thank you

## 2021-02-27 NOTE — Telephone Encounter (Signed)
   [facility] Pre-operative Risk Assessment    Patient Name: Danny Brewer  DOB: February 17, 1971 MRN: [id number]  HEARTCARE STAFF:  - IMPORTANT!!!!!! Under Visit Info/Reason for Call, type in Other and utilize the format Clearance MM/DD/YY or Clearance TBD. Do not use dashes or single digits. - Please review there is not already an duplicate clearance open for this procedure. - If request is for dental extraction, please clarify the # of teeth to be extracted. - If the patient is currently at the dentist's office, call Pre-Op Callback Staff (MA/nurse) to input urgent request.  - If the patient is not currently in the dentist office, please route to the Pre-Op pool.  Request for surgical clearance:  What type of surgery is being performed? LEFT SHOULDER SCOPE, POSSIBLE OPEN BICEP TENODESIS  When is this surgery scheduled? 05/06/21  What type of clearance is required (medical clearance vs. Pharmacy clearance to hold med vs. Both)? MEDICAL  Are there any medications that need to be held prior to surgery and how long? ASA  Practice name and name of physician performing surgery? [facility]; DR. Lennette Bihari Brewer  What is the office phone number? [phone]   7.   What is the office fax number? [phone] ATTN: Danny Brewer  8.   Anesthesia type (None, local, MAC, general) ? GENERAL   Danny Brewer 02/27/2021, 1:14 PM  _________________________________________________________________   (provider comments below)

## 2021-03-02 ENCOUNTER — Ambulatory Visit (HOSPITAL_COMMUNITY)
Admission: EM | Admit: 2021-03-02 | Discharge: 2021-03-02 | Disposition: A | Discharge status: Home / Self Care | Payer: BC Managed Care – PPO | Attending: Physician Assistant | Admitting: Physician Assistant

## 2021-03-02 ENCOUNTER — Encounter (HOSPITAL_COMMUNITY): Payer: Self-pay | Admitting: *Deleted

## 2021-03-02 ENCOUNTER — Other Ambulatory Visit: Payer: Self-pay

## 2021-03-02 DIAGNOSIS — S61210A Laceration without foreign body of right index finger without damage to nail, initial encounter: Secondary | ICD-10-CM | POA: Diagnosis not present

## 2021-03-02 NOTE — Discharge Instructions (Addendum)
I recommend keeping your index finger in a splint to help this wound heal and prevent recurrent bleeding.  Please monitor for any signs of infection including drainage from the wound, bleeding, increased pain, swelling, redness.  If you develop any systemic symptoms including fever, nausea, vomiting, body aches you need to go to the emergency room.  Keep area clean and dry.  We did not update your tetanus today since you are out today.  If you do have any worsening symptoms please return for reevaluation.

## 2021-03-02 NOTE — ED Triage Notes (Signed)
Pt cut the Rt index finger with the blade on a floor scraper.

## 2021-03-02 NOTE — ED Provider Notes (Signed)
[facility]    CSN: [id number] Arrival date & time: 03/02/21  1532      History   Chief Complaint Chief Complaint  Patient presents with   Laceration    HPI Danny Brewer is a 50 y.o. male.   Patient presents today with a several hour history of laceration to his right index finger PIP joint.  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  He reports full range of motion and denies any significant numbness or tingling.  He did have some numbness initially but this is likely related to his compression bandaging.  Patient did not clean this with anything but did immediately apply a compression bandage which has resulted in hemostasis.  He is right-handed and unable to perform daily duties as result of symptoms.  He does not take any blood thinning medication.  Reports that he is up-to-date on tetanus; he is unsure the exact date but is confident it has been less than 5 years since his last tetanus vaccine.  Reports pain is rated 7 on a 0-10 pain scale, localized to affected area, described as throbbing, worse with flexion of right index finger, no alleviating factors identified.   Past Medical History:  Diagnosis Date   BPH (benign prostatic hyperplasia)    Carotid artery disease (HCC)    [place] 5/22: Bilat 1-39    Complication of anesthesia    Pt verble and "prophecizes" after anesthesia   Coronary Artery Disease cardiologist--- dr cooper   Coronary CTA 09/2018: pLAD 70-90; pD1+pD2 70-90; mRCA mixed plaque - cannot rule out 70-90; FFR suggests hemodynamically significant stenosis // LHC 10/2018: 2v CAD >> s/p  CABG (L-LAD, L radial-RI, S-D2, S-RCA) with Dr. 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   LIMA--LAD;  SVG--D2;  SVG--dRCA;  left radial --- RI   Type 2 diabetes mellitus (HCC)    followed by pcp   (10-01-2020 checks blood sugar 3 times wkly,  fasting sugar-- 72--105)   Wears glasses     Patient Active Problem List   Diagnosis Date Noted   Carotid artery disease (HCC)    Erectile dysfunction 10/03/2020   Essential hypertension 10/31/2018   Hyperlipidemia 10/31/2018   Type 2 diabetes mellitus without complication, without long-term current use of insulin (HCC) 10/31/2018   Coronary artery disease 10/17/2018   Obstructive sleep apnea 10/15/2018   CAD (coronary artery disease), native coronary artery 10/14/2018   Chest pain with high risk of acute coronary syndrome 09/18/2018   Abnormal MRI of head 03/31/2017   Transient alteration of awareness 03/31/2017   Snoring 03/31/2017   Excessive daytime sleepiness 03/31/2017   Back pain    Paresthesias 08/26/2015   Confusion 08/26/2015   Sensory disturbance 08/26/2015   Atypical chest pain 08/26/2015   Dizziness and giddiness 06/06/2013   Viral illness 06/06/2013   GERD (gastroesophageal reflux disease) 06/06/2013    Past Surgical History:  Procedure Laterality Date   CORONARY ARTERY BYPASS GRAFT N/A 10/17/2018   Procedure:  CORONARY ARTERY BYPASS GRAFTING (CABG), ON PUMP, TIMES FOUR, USING LIMA TO LAD, ENDOSCOPICALLY HARVESTED GREATER SAPHENOUS VEIN TO DIAG 2, SVG TO DISTAL RCA, AND LEFT RADIAL TO RAMUS INTERMEDIUS;  Surgeon: [name], MD;  Location: [facility];  Service: Open Heart Surgery;  Laterality: N/A;   ENDOVEIN HARVEST OF GREATER SAPHENOUS VEIN Right 10/17/2018   Procedure: Mack Guise Of Greater Saphenous Vein;  Surgeon: [name], MD;  Location: [facility] OR;  Service: Open Heart Surgery;  Laterality: Right;   HAND SURGERY Left 1987   complex laceration   INGUINAL HERNIA REPAIR Bilateral 05/27/2015   Procedure:  LAPAROSCOPIC BILATERAL  INGUINAL HERNIA REPAIRS WITH MESH;  Surgeon: Karie Soda, MD;  Location: [facility];  Service: General;  Laterality: Bilateral;   KNEE ARTHROSCOPY Left 1991   LEFT HEART CATH AND CORONARY ANGIOGRAPHY N/A 10/14/2018   Procedure: LEFT HEART CATH AND CORONARY ANGIOGRAPHY;  Surgeon: Lyn Records, MD;  Location: [facility];  Service: Cardiovascular;  Laterality: N/A;   PENILE PROSTHESIS IMPLANT N/A 10/03/2020   Procedure: PLACEMENT OF PENILE PROTHESIS INFLATABLE;  Surgeon: Marcine Matar, MD;  Location: [facility];  Service: Urology;  Laterality: N/A;  pubis area   RADIAL ARTERY HARVEST Left 10/17/2018   Procedure: RADIAL ARTERY HARVEST;  Surgeon: [name], MD;  Location: [facility];  Service: Open Heart Surgery;  Laterality: Left;   SHOULDER CLOSED REDUCTION Left 09/04/2019   Procedure: CLOSED MANIPULATION SHOULDER;  Surgeon: Dannielle Huh, MD;  Location: [facility];  Service: Orthopedics;  Laterality: Left;   TEE WITHOUT CARDIOVERSION N/A 10/17/2018   Procedure: TRANSESOPHAGEAL ECHOCARDIOGRAM (TEE);  Surgeon: [name], MD;  Location: [facility] OR;  Service: Open Heart Surgery;  Laterality: N/A;       Home Medications    Prior to Admission medications   Medication Sig Start Date End Date Taking? Authorizing Provider  acetaminophen (TYLENOL) 325 MG tablet Take 650 mg by mouth every 6 (six) hours as needed for moderate pain.    [provider]  Alirocumab (PRALUENT) 75 MG/ML SOAJ INJECT 1 ML INTO THE SKIN EVERY 14 (FOURTEEN) DAYS. 02/24/21   Tonny Bollman, MD  amLODipine (NORVASC) 5 MG tablet Take 1 tablet (5 mg total) by mouth daily. Patient taking differently: Take 5 mg by mouth daily. 08/22/20 02/21/21  Tereso Newcomer T, PA-C  aspirin 81 MG tablet Take 1 tablet (81 mg total) by mouth daily. 10/11/18   Tereso Newcomer T, PA-C  ezetimibe (ZETIA) 10 MG tablet TAKE 1 TABLET BY MOUTH EVERY DAY Patient taking differently: Take 10 mg by mouth.  08/22/19   Marjie Skiff E, PA-C  fluconazole (DIFLUCAN) 100 MG tablet Take 1 tablet (100 mg total) by mouth daily. X 7 days 10/04/20   Marcine Matar, MD  oxyCODONE (ROXICODONE) 5 MG immediate release tablet Take 1 tablet (5 mg total) by mouth every 4 (four) hours as needed for severe pain. 10/04/20 10/04/21  Marcine Matar, MD  sitaGLIPtin (JANUVIA) 100 MG tablet Take 100 mg by mouth daily.    [provider]  tamsulosin (FLOMAX) 0.4 MG CAPS capsule Take 0.4 mg by mouth daily.    [provider]    Family History Family History  Problem Relation Age of Onset   Hypertension Mother    Cancer Father    Heart failure Father    Sudden death Brother    Heart attack Brother    Hypertension Sister    Heart murmur Sister  Social History Social History   Tobacco Use   Smoking status: Never   Smokeless tobacco: Never  Vaping Use   Vaping Use: Never used  Substance Use Topics   Alcohol use: No   Drug use: Never     Allergies   Antihistamines, diphenhydramine-type; Demerol; Morphine and related; Compazine [prochlorperazine]; Metoprolol; and Statins   Review of Systems Review of Systems  Constitutional:  Positive for activity change. Negative for appetite change, fatigue and fever.  Respiratory:  Negative for cough and shortness of breath.   Cardiovascular:  Negative for chest pain.  Musculoskeletal:  Negative for arthralgias and myalgias.  Skin:  Positive for wound. Negative for color change.  Neurological:  Negative for weakness and numbness.    Physical Exam Triage Vital Signs ED Triage Vitals  Enc Vitals Group     BP 03/02/21 1640 135/89     Pulse Rate 03/02/21 1640 87     Resp 03/02/21 1640 18     Temp 03/02/21 1640 98.2 F (36.8 C)     Temp src --      SpO2 03/02/21 1640 100 %     Weight --      Height --      Head Circumference --      Peak Flow --      Pain Score 03/02/21 1637 7     Pain Loc --      Pain Edu? --      Excl. in  GC? --    No data found.  Updated Vital Signs BP 135/89   Pulse 87   Temp 98.2 F (36.8 C)   Resp 18   SpO2 100%   Visual Acuity Right Eye Distance:   Left Eye Distance:   Bilateral Distance:    Right Eye Near:   Left Eye Near:    Bilateral Near:     Physical Exam Vitals reviewed.  Constitutional:      General: He is awake.     Appearance: Normal appearance. He is well-developed. He is not ill-appearing.     Comments: Very pleasant male appears stated age no acute distress sitting comfortably in exam room  HENT:     Head: Normocephalic and atraumatic.  Cardiovascular:     Rate and Rhythm: Normal rate and regular rhythm.     Pulses:          Radial pulses are 2+ on the right side and 2+ on the left side.     Heart sounds: Normal heart sounds, S1 normal and S2 normal. No murmur heard.    Comments: Capillary fill within 2 seconds right fingers Pulmonary:     Effort: Pulmonary effort is normal.     Breath sounds: Normal breath sounds. No stridor. No wheezing, rhonchi or rales.     Comments: Clear to auscultation bilaterally Musculoskeletal:     Right hand: Laceration present. No swelling. Normal strength. There is no disruption of two-point discrimination. Normal capillary refill.     Comments: 3 cm laceration noted over right index PIP joint without active bleeding.  0.5 cm laceration noted right ring MCP joint; no active bleeding or drainage noted.  Neurological:     Mental Status: He is alert.  Psychiatric:        Behavior: Behavior is cooperative.     UC Treatments / Results  Labs (all labs ordered are listed, but only abnormal results are displayed) Labs Reviewed - No data to display  EKG   Radiology No  results found.  Procedures Procedures (including critical care time)  Medications Ordered in UC Medications - No data to display  Initial Impression / Assessment and Plan / UC Course  I have reviewed the triage vital signs and the nursing  notes.  Pertinent labs & imaging results that were available during my care of the patient were reviewed by me and considered in my medical decision making (see chart for details).      Laceration is relatively superficial and hemostasis has been achieved so sutures were not placed today; discussed that Dermabond and Steri-Strips would be ineffective given placement.  Patient was encouraged to keep area straight as regular flexion of PIP joint will likely lead to dehiscence take a longer time to heal.  Patient was placed in finger splint to help aid with healing and provide comfort.  Area was cleaned and irrigated with sterile water and chlorhexidine was applied prior to splint being applied.  Patient declined updating tetanus as he is confident he is up-to-date.  Recommended to use over-the-counter medications for symptom relief.  He was provided work excuse note given physical nature of job.  Discussed alarm symptoms that warrant emergent evaluation including recurrent bleeding, signs of infection, numbness, paresthesias.  Strict return precautions given to which he expressed understanding.  Final Clinical Impressions(s) / UC Diagnoses   Final diagnoses:  Laceration of right index finger without foreign body without damage to nail, initial encounter     Discharge Instructions      I recommend keeping your index finger in a splint to help this wound heal and prevent recurrent bleeding.  Please monitor for any signs of infection including drainage from the wound, bleeding, increased pain, swelling, redness.  If you develop any systemic symptoms including fever, nausea, vomiting, body aches you need to go to the emergency room.  Keep area clean and dry.  We did not update your tetanus today since you are out today.  If you do have any worsening symptoms please return for reevaluation.     ED Prescriptions   None    PDMP not reviewed this encounter.   Jeani Hawking, PA-C 03/02/21  1712

## 2021-03-03 NOTE — Telephone Encounter (Signed)
   Patient Name: Danny Brewer  DOB: Jan 28, 1971 MRN: [id number]  Primary Cardiologist: Chrystie Nose, MD  Chart reviewed as part of pre-operative protocol coverage. Pre-op clearance already addressed by colleagues in earlier phone notes. To summarize recommendations:  - Per Vin Bhagat PA-C, "The patient was doing well on cardiac stand point when seen by Dr. Rennis Golden 02/21/21. Given past medical history and time since last visit, based on ACC/AHA guidelines, Ford Peddie would be at acceptable risk for the planned procedure without further cardiovascular testing. "  - Per Dr. Rennis Golden, "Ok to hold aspirin 7 days prior to procedure if necessary, restart after"  Will route this bundled recommendation to requesting provider via Epic fax function and remove from pre-op pool. Please call with questions.  Laurann Montana, PA-C 03/03/2021, 11:18 AM

## 2021-03-07 ENCOUNTER — Telehealth: Payer: Self-pay | Admitting: Internal Medicine

## 2021-03-07 NOTE — Telephone Encounter (Signed)
Spoke with pt. He report he is scheduled to have shoulder surgery but on that same side is experiencing neck and jaw pain. He also report numbness down his arm. He state he contacted ortho but was advised to contact his cardiologist. Pt state he feels like it could be nerve pain but wanted to rule out his heart.  Nurse advised to report to ER for further evaluations based on symptoms. Pt verbalized understanding.

## 2021-03-07 NOTE — Telephone Encounter (Signed)
Patient was returning call 

## 2021-03-07 NOTE — Telephone Encounter (Signed)
Left message to call back  

## 2021-03-07 NOTE — Telephone Encounter (Signed)
Patient called in to say the left side of his neck and jaw hurts exteremly bad. He has surgery coming up so he just want to make sure everything is okay with his heart. Please advise

## 2021-03-28 ENCOUNTER — Emergency Department (HOSPITAL_BASED_OUTPATIENT_CLINIC_OR_DEPARTMENT_OTHER)
Admission: EM | Admit: 2021-03-28 | Discharge: 2021-03-28 | Disposition: A | Discharge status: Home / Self Care | Payer: BC Managed Care – PPO | Attending: Emergency Medicine | Admitting: Emergency Medicine

## 2021-03-28 ENCOUNTER — Other Ambulatory Visit: Payer: Self-pay

## 2021-03-28 DIAGNOSIS — I2510 Atherosclerotic heart disease of native coronary artery without angina pectoris: Secondary | ICD-10-CM | POA: Insufficient documentation

## 2021-03-28 DIAGNOSIS — E119 Type 2 diabetes mellitus without complications: Secondary | ICD-10-CM | POA: Insufficient documentation

## 2021-03-28 DIAGNOSIS — I10 Essential (primary) hypertension: Secondary | ICD-10-CM | POA: Diagnosis not present

## 2021-03-28 DIAGNOSIS — R079 Chest pain, unspecified: Secondary | ICD-10-CM | POA: Insufficient documentation

## 2021-03-28 DIAGNOSIS — G43909 Migraine, unspecified, not intractable, without status migrainosus: Secondary | ICD-10-CM | POA: Diagnosis not present

## 2021-03-28 DIAGNOSIS — Z7982 Long term (current) use of aspirin: Secondary | ICD-10-CM | POA: Insufficient documentation

## 2021-03-28 DIAGNOSIS — R519 Headache, unspecified: Secondary | ICD-10-CM | POA: Diagnosis present

## 2021-03-28 DIAGNOSIS — Z951 Presence of aortocoronary bypass graft: Secondary | ICD-10-CM | POA: Insufficient documentation

## 2021-03-28 DIAGNOSIS — Z79899 Other long term (current) drug therapy: Secondary | ICD-10-CM | POA: Diagnosis not present

## 2021-03-28 DIAGNOSIS — Z7984 Long term (current) use of oral hypoglycemic drugs: Secondary | ICD-10-CM | POA: Insufficient documentation

## 2021-03-28 DIAGNOSIS — G43109 Migraine with aura, not intractable, without status migrainosus: Secondary | ICD-10-CM

## 2021-03-28 LAB — BASIC METABOLIC PANEL
Anion gap: 8 (ref 5–15)
BUN: 13 mg/dL (ref 6–20)
CO2: 26 mmol/L (ref 22–32)
Calcium: 9.2 mg/dL (ref 8.9–10.3)
Chloride: 105 mmol/L (ref 98–111)
Creatinine, Ser: 1.22 mg/dL (ref 0.61–1.24)
GFR, Estimated: >60 mL/min (ref >60)
Glucose, Bld: 92 mg/dL (ref 70–99)
Potassium: 4.1 mmol/L (ref 3.5–5.1)
Sodium: 139 mmol/L (ref 135–145)

## 2021-03-28 LAB — CBC
HCT: 45 % (ref 39.0–52.0)
Hemoglobin: 14.3 g/dL (ref 13.0–17.0)
MCH: 27.6 pg (ref 26.0–34.0)
MCHC: 31.8 g/dL (ref 30.0–36.0)
MCV: 86.7 fL (ref 80.0–100.0)
Platelets: 218 10*3/uL (ref 150–400)
RBC: 5.19 MIL/uL (ref 4.22–5.81)
RDW: 13 % (ref 11.5–15.5)
WBC: 7 10*3/uL (ref 4.0–10.5)
nRBC: 0 % (ref 0.0–0.2)

## 2021-03-28 LAB — TROPONIN I (HIGH SENSITIVITY)
Troponin I (High Sensitivity): <2 ng/L (ref <18)
Troponin I (High Sensitivity): <2 ng/L (ref <18)

## 2021-03-28 MED ORDER — METOCLOPRAMIDE HCL 5 MG/ML IJ SOLN
5.0000 mg | Freq: Once | INTRAMUSCULAR | Status: AC
  Administered 2021-03-28: 5 mg via INTRAVENOUS
  Filled 2021-03-28: qty 2

## 2021-03-28 NOTE — ED Provider Notes (Signed)
[facility] EMERGENCY DEPT Provider Note   CSN: [id number] Arrival date & time: 03/28/21  1859     History Chief Complaint  Patient presents with   Numbness    Danny Brewer is a 50 y.o. male.  HPI Patient presents with tingling in his jaw.  States it feels strange.  States also as if the left tongue will draw off to the side.  States he has had some episodes of it for the last week.  No difficulty swallowing.  Also occasionally have a sharp left-sided chest pain.  Had a headache that developed today.  Has not necessarily had a headache along the way today.  Head is history of some facial issues.  Has had MRIs for the same and stroke in been ruled out.  Had thought to be a complicated migraine time.  Although does have a cardiac history.  No other numbness weakness.  Was not having difficulty speaking but reportedly had some difficulty with his tongue.    Past Medical History:  Diagnosis Date   BPH (benign prostatic hyperplasia)    Carotid artery disease (HCC)    [place] 5/22: Bilat 1-39    Complication of anesthesia    Pt verble and "prophecizes" after anesthesia   Coronary Artery Disease cardiologist--- dr cooper   Coronary CTA 09/2018: pLAD 70-90; pD1+pD2 70-90; mRCA mixed plaque - cannot rule out 70-90; FFR suggests hemodynamically significant stenosis // LHC 10/2018: 2v CAD >> s/p  CABG (L-LAD, L radial-RI, S-D2, S-RCA) with Dr. 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   LIMA--LAD;  SVG--D2;  SVG--dRCA;  left radial --- RI   Type 2 diabetes mellitus (HCC)    followed by pcp   (10-01-2020 checks blood sugar 3 times wkly,  fasting sugar-- 72--105)   Wears glasses     Patient Active Problem List   Diagnosis Date Noted   Carotid artery disease (HCC)    Erectile dysfunction 10/03/2020   Essential hypertension  10/31/2018   Hyperlipidemia 10/31/2018   Type 2 diabetes mellitus without complication, without long-term current use of insulin (HCC) 10/31/2018   Coronary artery disease 10/17/2018   Obstructive sleep apnea 10/15/2018   CAD (coronary artery disease), native coronary artery 10/14/2018   Chest pain with high risk of acute coronary syndrome 09/18/2018   Abnormal MRI of head 03/31/2017   Transient alteration of awareness 03/31/2017   Snoring 03/31/2017   Excessive daytime sleepiness 03/31/2017   Back pain    Paresthesias 08/26/2015   Confusion 08/26/2015   Sensory disturbance 08/26/2015   Atypical chest pain 08/26/2015   Dizziness and giddiness 06/06/2013   Viral illness 06/06/2013   GERD (gastroesophageal reflux disease) 06/06/2013    Past Surgical History:  Procedure Laterality Date   CORONARY ARTERY BYPASS GRAFT N/A 10/17/2018   Procedure: CORONARY ARTERY BYPASS GRAFTING (CABG), ON PUMP, TIMES FOUR, USING LIMA TO LAD, ENDOSCOPICALLY HARVESTED GREATER SAPHENOUS VEIN TO DIAG 2, SVG TO DISTAL RCA, AND LEFT RADIAL TO RAMUS INTERMEDIUS;  Surgeon: [name], MD;  [facility]: [facility] OR;  Service: Open Heart Surgery;  Laterality: N/A;   ENDOVEIN HARVEST OF GREATER SAPHENOUS VEIN Right 10/17/2018   Procedure: Mack Guise Of Greater Saphenous Vein;  Surgeon: [name], MD;  Location: [facility] OR;  Service: Open Heart Surgery;  Laterality: Right;   HAND SURGERY Left 1987   complex laceration   INGUINAL HERNIA REPAIR Bilateral 05/27/2015   Procedure: LAPAROSCOPIC BILATERAL  INGUINAL HERNIA REPAIRS WITH MESH;  Surgeon: Karie Soda, MD;  Location: [facility] ORS;  Service: General;  Laterality: Bilateral;   KNEE ARTHROSCOPY Left 1991   LEFT HEART CATH AND CORONARY ANGIOGRAPHY N/A 10/14/2018   Procedure: LEFT HEART CATH AND CORONARY ANGIOGRAPHY;  Surgeon: Lyn Records, MD;  [facility]: [facility] INVASIVE CV LAB;  Service: Cardiovascular;  Laterality: N/A;   PENILE PROSTHESIS IMPLANT N/A 10/03/2020    Procedure: PLACEMENT OF PENILE PROTHESIS INFLATABLE;  Surgeon: Marcine Matar, MD;  Location: [facility];  Service: Urology;  Laterality: N/A;  pubis area   RADIAL ARTERY HARVEST Left 10/17/2018   Procedure: RADIAL ARTERY HARVEST;  Surgeon: [name], MD;  Location: [facility] OR;  Service: Open Heart Surgery;  Laterality: Left;   SHOULDER CLOSED REDUCTION Left 09/04/2019   Procedure: CLOSED MANIPULATION SHOULDER;  Surgeon: Dannielle Huh, MD;  Location: [facility] ORS;  Service: Orthopedics;  Laterality: Left;   TEE WITHOUT CARDIOVERSION N/A 10/17/2018   Procedure: TRANSESOPHAGEAL ECHOCARDIOGRAM (TEE);  Surgeon: [name], MD;  Location: [facility] OR;  Service: Open Heart Surgery;  Laterality: N/A;       Family History  Problem Relation Age of Onset   Hypertension Mother    Cancer Father    Heart failure Father    Sudden death Brother    Heart attack Brother    Hypertension Sister    Heart murmur Sister     Social History   Tobacco Use   Smoking status: Never   Smokeless tobacco: Never  Vaping Use   Vaping Use: Never used  Substance Use Topics   Alcohol use: No   Drug use: Never    Home Medications Prior to Admission medications   Medication Sig Start Date End Date Taking? Authorizing Provider  acetaminophen (TYLENOL) 325 MG tablet Take 650 mg by mouth every 6 (six) hours as needed for moderate pain.    [provider]  Alirocumab (PRALUENT) 75 MG/ML SOAJ INJECT 1 ML INTO THE SKIN EVERY 14 (FOURTEEN) DAYS. 02/24/21   Tonny Bollman, MD  amLODipine (NORVASC) 5 MG tablet Take 1 tablet (5 mg total) by mouth daily. Patient taking differently: Take 5 mg by mouth daily. 08/22/20 02/21/21  Tereso Newcomer T, PA-C  aspirin 81 MG tablet Take 1 tablet (81 mg total) by mouth daily. 10/11/18   Tereso Newcomer T, PA-C  ezetimibe (ZETIA) 10 MG tablet TAKE 1 TABLET BY MOUTH EVERY DAY Patient taking differently: Take 10 mg by mouth. 08/22/19   Marjie Skiff E, PA-C   fluconazole (DIFLUCAN) 100 MG tablet Take 1 tablet (100 mg total) by mouth daily. X 7 days 10/04/20   Marcine Matar, MD  oxyCODONE (ROXICODONE) 5 MG immediate release tablet Take 1 tablet (5 mg total) by mouth every 4 (four) hours as needed for severe pain. 10/04/20 10/04/21  Marcine Matar, MD  sitaGLIPtin (JANUVIA) 100 MG tablet Take 100 mg by mouth daily.    [provider]  tamsulosin (FLOMAX) 0.4 MG CAPS capsule Take 0.4 mg by mouth daily.    [provider]    Allergies    Antihistamines, diphenhydramine-type; Demerol; Morphine and related; Compazine [prochlorperazine]; Metoprolol; and Statins  Review of Systems   Review of Systems  Constitutional:  Negative for appetite change.  HENT:  Negative for congestion.  Left-sided jaw tingling.  Tongue feels strange.  Respiratory:  Negative for cough and shortness of breath.   Cardiovascular:  Positive for chest pain.  Gastrointestinal:  Negative for abdominal pain.  Genitourinary:  Negative for flank pain.  Musculoskeletal:  Negative for gait problem.  Neurological:  Negative for weakness.  Psychiatric/Behavioral:  Negative for confusion. The patient is nervous/anxious.    Physical Exam Updated Vital Signs BP 138/87   Pulse 71   Temp 98.6 F (37 C) (Oral)   Resp 10   Ht 5\' 9"  (1.753 m)   Wt 88.5 kg   SpO2 98%   BMI 28.80 kg/m   Physical Exam Vitals and nursing note reviewed.  HENT:     Head: Atraumatic.     Comments: Full activity of tongue.  No bite.  Normal sensation on face.    Mouth/Throat:     Mouth: Mucous membranes are moist.  Eyes:     Pupils: Pupils are equal, round, and reactive to light.  Cardiovascular:     Rate and Rhythm: Regular rhythm.  Pulmonary:     Effort: No respiratory distress.  Abdominal:     Tenderness: There is no abdominal tenderness.  Musculoskeletal:        General: No tenderness.     Cervical back: Neck supple.  Skin:    General: Skin is warm.      Capillary Refill: Capillary refill takes less than 2 seconds.  Neurological:     Mental Status: He is alert and oriented to person, place, and time.    ED Results / Procedures / Treatments   Labs (all labs ordered are listed, but only abnormal results are displayed) Labs Reviewed  BASIC METABOLIC PANEL  CBC  TROPONIN I (HIGH SENSITIVITY)  TROPONIN I (HIGH SENSITIVITY)    EKG EKG Interpretation  Date/Time:  Friday March 28 2021 19:10:36 EDT Ventricular Rate:  89 PR Interval:  150 QRS Duration: 90 QT Interval:  362 QTC Calculation: 440 R Axis:   64 Text Interpretation: Normal sinus rhythm Normal ECG Confirmed by 04-12-2004 [id number]) on 03/28/2021 7:23:05 PM  Radiology No results found.  Procedures Procedures   Medications Ordered in ED Medications  metoCLOPramide (REGLAN) injection 5 mg (5 mg Intravenous Given 03/28/21 1938)    ED Course  I have reviewed the triage vital signs and the nursing notes.  Pertinent labs & imaging results that were available during my care of the patient were reviewed by me and considered in my medical decision making (see chart for details).    MDM Rules/Calculators/A&P                           Patient presents with headache and left-sided facial tingling.  States his jaw and tongue feel off.  States the headache came on acutely.  Has had episodes similar to this in the past with negative MRI.  Reviewing neurology notes was thought it was potentially complicated migraine.  Has had migraines in the past.  Cardiac work-up was reassuring.  EKG reassuring and negative troponin.  Treated with Reglan and headache is resolved as have the neurologic findings on his jaw and tongue.  I think this is most likely a complicated migraine or similar episode as if he has had previously.  Doubt stroke.  Doubt TIA.  Doubt intracranial hemorrhage.  Has a neurologist he can follow-up with.  Doubt cardiac cause.  Will discharge home Final Clinical  Impression(s) / ED Diagnoses  Final diagnoses:  Complicated migraine    Rx / DC Orders ED Discharge Orders     None        Benjiman Core, MD 03/28/21 2306

## 2021-03-28 NOTE — ED Triage Notes (Addendum)
Pt to ED from home with c/o left jaw tingling that started at 1800 this evening. Pt states that his symptoms started yesterday but went away but progressed this evening. Pt states his jaw feels strange and he feels very anxious and has lost control of the function of his mouth. Pt is not experiencing any dysphagia/aphasia or other stroke like symptoms.

## 2021-03-28 NOTE — ED Notes (Signed)
Pt verbalizes understanding of discharge instructions. Opportunity for questioning and answers were provided. Armand removed by staff, pt discharged from ED to home. Educated to f/u with Neurology.

## 2021-03-30 ENCOUNTER — Telehealth: Payer: Self-pay | Admitting: Neurology

## 2021-03-30 NOTE — Telephone Encounter (Signed)
I returned call from patient to [facility] answering service.  He stated that he was feeling some involuntary movements in his left jaw and his tongue was deviating to his teeth on the left side.  This is having intermittently.  He was seen for similar complaints in the ER 2 days ago and also had headache at that time but thought it was complicated migraine given the headache cocktail along with Reglan and that helped his symptoms.  He has seen Dr. Epimenio Foot in the past for unrelated symptoms and he has an abnormal MRI with some nonspecific T2 white matter hyperintensities.  I was unable to give him a definitive diagnosis over the phone but felt this could be some kind of focal dystonia.  I suggested he try Benadryl but he stated he was allergic to it.  I asked him to call the office and make an appointment to see Dr. Epimenio Foot in someone else next week.  If symptoms got worse he was advised to go back to the ER.  He voiced understanding.

## 2021-04-22 ENCOUNTER — Ambulatory Visit (INDEPENDENT_AMBULATORY_CARE_PROVIDER_SITE_OTHER): Payer: BC Managed Care – PPO | Admitting: Neurology

## 2021-04-22 ENCOUNTER — Encounter: Payer: Self-pay | Admitting: Neurology

## 2021-04-22 VITALS — BP 137/89 | HR 95 | Ht 69.0 in | Wt 198.0 lb

## 2021-04-22 DIAGNOSIS — R519 Headache, unspecified: Secondary | ICD-10-CM | POA: Diagnosis not present

## 2021-04-22 DIAGNOSIS — G5132 Clonic hemifacial spasm, left: Secondary | ICD-10-CM | POA: Diagnosis not present

## 2021-04-22 DIAGNOSIS — R930 Abnormal findings on diagnostic imaging of skull and head, not elsewhere classified: Secondary | ICD-10-CM

## 2021-04-22 MED ORDER — BACLOFEN 10 MG PO TABS: 10.0000 mg | ORAL_TABLET | Freq: Three times a day (TID) | ORAL | 0 refills | Status: DC

## 2021-04-22 NOTE — Progress Notes (Signed)
[facility]  PATIENT: Danny Brewer DOB: [birth year]  REFERRING DOCTOR OR PCP:  Lynnea Ferrier is PCP; referred by Jene Every SOURCE: Patient, notes from Dr. Shelle Iron, multiple MRI and CT reports and images on PACS  _________________________________   HISTORICAL  CHIEF COMPLAINT:  Chief Complaint  Patient presents with   Follow-up    Rm 1, pt alone, pt states there have been 3 occasions and it was always after eating where his left tongue would start clicking sound, left jaw would tighten up and he would have left facial numbness. Its intermittent.     HISTORY OF PRESENT ILLNESS:  Update 04/22/2021 He has had a couple more episodes of the left jaw tightening up for 30-90 minutes.   It is painful like intense pins and needles.   Twice this happened after eating and twice this happened with intermittent fasting and drinking a lot of fluids.   He also had some chest pain and troponin was fine.    He went to the ED 03/28/21 and BMP was noral.   Potassium and calcium were in normal range.    Vascular risks:  Type 2 NIDDM (since 2018), HTN, elevated cholesterol.     Current symptoms are distinct from the issue he had earlier this year when he reported about 24 hours numbness in the left arm and face after his second COVID vaccination.  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  He does have hypertension.  He denies any difficulty with gait, balance, numbness or strength now.   He sleeps well most nights.  He tries to  Live a stress free life.    No depression or anxiety.     Vascular risk factors:   HTN, NIDDM, CAD.   No h/o arrhythmia.   No significant OSA.   In May 2020 he had CABG due to CAD and angina.     He has changed his lifestyle and is  eating better and exercising some.       Imaging:  MRI of the cervical spine 03/11/2017. It showed degenerative changes at C4-C5, C5-C6 and C6-C7 but also showed a small T2 hyperintense focus within the right dorsolateral pons. He was referred for further evaluation.  MRI of the brain dated 08/25/2015 and 02/23/2010 show a stable 3-4 mm focus in the posterior right pons near the middle cerebellar peduncle. It is unchanged in size or signal characteristics.  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     MRI brain 04/22/2020 showed just one T2/FLAIR hyperintense focus in the right posterior pons, present in 2017 and one left temporal chronic microhemorrhage, not present in 2017     REVIEW OF SYSTEMS: Constitutional: No fevers, chills, sweats, or change in appetite.   He has excessive sleepiness Eyes: No visual changes, double vision, eye pain Ear, nose and throat: No hearing loss, ear pain, nasal congestion, sore throat Cardiovascular: No chest pain, palpitations Respiratory:  No shortness of breath at rest or with exertion.   No wheezes.  He snores GastrointestinaI: No nausea, vomiting, diarrhea, abdominal pain, fecal incontinence Genitourinary:  No dysuria, urinary retention or frequency.  No nocturia. Musculoskeletal:  No neck pain, back pain Integumentary: No rash, pruritus, skin lesions Neurological: as above Psychiatric: No depression at  this time.  No anxiety Endocrine: No palpitations, diaphoresis, change in appetite, change in weigh or increased thirst Hematologic/Lymphatic:  No anemia, purpura, petechiae. Allergic/Immunologic: No itchy/runny eyes, nasal congestion, recent allergic reactions, rashes  ALLERGIES: Allergies  Allergen Reactions   Antihistamines, Diphenhydramine-Type Hives and Other (See Comments)    Seizures   Demerol Hives   Morphine And Related Hives    Esp with high dose morphine, possibly Dilaudid Tolerates hydrocodone, oxycodone   Compazine [Prochlorperazine]  Other (See Comments)    Spastic movement   Metoprolol     Per patient caused "severe headache"   Statins Other (See Comments)    myalgias    HOME MEDICATIONS:  Current Outpatient Medications:    acetaminophen (TYLENOL) 325 MG tablet, Take 650 mg by mouth every 6 (six) hours as needed for moderate pain., Disp: , Rfl:    Alirocumab (PRALUENT) 75 MG/ML SOAJ, INJECT 1 ML INTO THE SKIN EVERY 14 (FOURTEEN) DAYS., Disp: 2 mL, Rfl: 11   aspirin 81 MG tablet, Take 1 tablet (81 mg total) by mouth daily., Disp: , Rfl:    baclofen (LIORESAL) 10 MG tablet, Take 1 tablet (10 mg total) by mouth 3 (three) times daily., Disp: 30 each, Rfl: 0   ezetimibe (ZETIA) 10 MG tablet, TAKE 1 TABLET BY MOUTH EVERY DAY (Patient taking differently: Take 10 mg by mouth.), Disp: 90 tablet, Rfl: 3   fluconazole (DIFLUCAN) 100 MG tablet, Take 1 tablet (100 mg total) by mouth daily. X 7 days, Disp: 7 tablet, Rfl: 0   oxyCODONE (ROXICODONE) 5 MG immediate release tablet, Take 1 tablet (5 mg total) by mouth every 4 (four) hours as needed for severe pain., Disp: 15 tablet, Rfl: 0   sitaGLIPtin (JANUVIA) 100 MG tablet, Take 100 mg by mouth daily., Disp: , Rfl:    tamsulosin (FLOMAX) 0.4 MG CAPS capsule, Take 0.4 mg by mouth daily., Disp: , Rfl:    amLODipine (NORVASC) 5 MG tablet, Take 1 tablet (5 mg total) by mouth daily. (Patient taking differently: Take 5 mg by mouth daily.), Disp: 90 tablet, Rfl: 3  PAST MEDICAL HISTORY: Past Medical History:  Diagnosis Date   BPH (benign prostatic hyperplasia)    Carotid artery disease (HCC)    [place] 5/22: Bilat 1-39    Complication of anesthesia    Pt verble and "prophecizes" after anesthesia   Coronary Artery Disease cardiologist--- dr cooper   Coronary CTA 09/2018: pLAD 70-90; pD1+pD2 70-90; mRCA mixed plaque - cannot rule out 70-90; FFR suggests hemodynamically significant stenosis // LHC 10/2018: 2v CAD >> s/p  CABG (L-LAD, L radial-RI, S-D2, S-RCA) with Dr. 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 OR;  Service: Open Heart Surgery;  Laterality: N/A;   ENDOVEIN HARVEST OF GREATER SAPHENOUS VEIN Right 10/17/2018   Procedure: Mack Guise Of Greater Saphenous Vein;  Surgeon: [name], MD;  Location: [facility] OR;  Service: Open Heart Surgery;  Laterality: Right;   HAND SURGERY Left 1987   complex laceration   INGUINAL HERNIA REPAIR Bilateral 05/27/2015   Procedure: LAPAROSCOPIC BILATERAL  INGUINAL HERNIA REPAIRS WITH MESH;  Surgeon: Karie Soda, MD;  Location: [facility] ORS;  Service: General;  Laterality: Bilateral;   KNEE ARTHROSCOPY Left 1991   LEFT HEART CATH AND CORONARY ANGIOGRAPHY N/A 10/14/2018   Procedure: LEFT HEART CATH AND CORONARY ANGIOGRAPHY;  Surgeon: Lyn Records, MD;  Location: [facility] INVASIVE CV LAB;  Service: Cardiovascular;  Laterality: N/A;   PENILE PROSTHESIS IMPLANT N/A 10/03/2020   Procedure: PLACEMENT OF PENILE PROTHESIS INFLATABLE;  Surgeon: Marcine Matar, MD;  Location: [facility];  Service: Urology;  Laterality: N/A;  pubis area   RADIAL ARTERY HARVEST Left 10/17/2018   Procedure: RADIAL ARTERY HARVEST;  Surgeon: [name], MD;  Location: [facility] OR;  Service: Open Heart Surgery;  Laterality: Left;    SHOULDER CLOSED REDUCTION Left 09/04/2019   Procedure: CLOSED MANIPULATION SHOULDER;  Surgeon: Dannielle Huh, MD;  Location: [facility]S;  Service: Orthopedics;  Laterality: Left;   SHOULDER SURGERY Left 2022   TEE WITHOUT CARDIOVERSION N/A 10/17/2018   Procedure: TRANSESOPHAGEAL ECHOCARDIOGRAM (TEE);  Surgeon: [name], MD;  Location: [facility] OR;  Service: Open Heart Surgery;  Laterality: N/A;    FAMILY HISTORY: Family History  Problem Relation Age of Onset   Hypertension Mother    Cancer Father    Heart failure Father    Sudden death Brother    Heart attack Brother    Hypertension Sister    Heart murmur Sister     SOCIAL HISTORY:  Social History   Socioeconomic History   Marital status: Married    Spouse name: Not on file   Number of children: Not on file   Years of education: 14   Highest education level: Not on file  Occupational History   Occupation: [employer]  Tobacco Use   Smoking status: Never   Smokeless tobacco: Never  Vaping Use   Vaping Use: Never used  Substance and Sexual Activity   Alcohol use: No   Drug use: Never   Sexual activity: Not on file  Other Topics Concern   Not on file  Social History Narrative   Right handed   Caffeine use: 1 tea/day   Social Determinants of Health   Financial Resource Strain: Not on file  Food Insecurity: Not on file  Transportation Needs: Not on file  Physical Activity: Not on file  Stress: Not on file  Social Connections: Not on file  Intimate Partner Violence: Not on file     PHYSICAL EXAM  Vitals:   04/22/21 0809  BP: 137/89  Pulse: 95  Weight: 198 lb (89.8 kg)  Height: 5\' 9"  (1.753 m)    Body mass index is 29.24 kg/m.   General: The patient is well-developed and well-nourished and in no acute distress.  Pharynx is Mallampati 2  Neck:   The neck is nontender with slightly reduced ROM.    Skin: Extremities are without significant edema.     Musculoskeletal:  Back is nontender  Neurologic  Exam  Mental status: The patient is alert and oriented x 3 at  the time of the examination. The patient has apparent normal recent and remote memory, with an apparently normal attention span and concentration ability.   Speech is normal.  Cranial nerves: Extraocular movements are full.  Facial strength and sensation is normal.  The tongue is midline, and the patient has symmetric elevation of the soft palate. No obvious hearing deficits are noted.  Motor:  Muscle bulk is normal.   Tone is normal. Strength is  5 / 5 in all 4 extremities.   Sensory: Sensory testing is intact to pinprick, soft touch and vibration sensation in all 4 extremities.  Coordination: Cerebellar testing reveals good finger-nose-finger and heel-to-shin bilaterally.  Gait and station: Station is normal.   Gait is normal. Tandem gait is normal. Romberg is negative.   Reflexes: Deep tendon reflexes are 3 at knees and 2 elsewhere.         DIAGNOSTIC DATA (LABS, IMAGING, TESTING) - I reviewed patient records, labs, notes, testing and imaging myself where available.  Lab Results  Component Value Date   WBC 7.0 03/28/2021   HGB 14.3 03/28/2021   HCT 45.0 03/28/2021   MCV 86.7 03/28/2021   PLT 218 03/28/2021      Component Value Date/Time   NA 139 03/28/2021 1931   NA 139 09/15/2019 1237   K 4.1 03/28/2021 1931   CL 105 03/28/2021 1931   CO2 26 03/28/2021 1931   GLUCOSE 92 03/28/2021 1931   BUN 13 03/28/2021 1931   BUN 16 09/15/2019 1237   CREATININE 1.22 03/28/2021 1931   CREATININE 0.93 12/18/2014 1157   CALCIUM 9.2 03/28/2021 1931   PROT 7.0 04/22/2020 2239   PROT 7.0 09/15/2019 1237   ALBUMIN 4.1 04/22/2020 2239   ALBUMIN 4.1 09/15/2019 1237   AST 26 04/22/2020 2239   ALT 21 04/22/2020 2239   ALKPHOS 56 04/22/2020 2239   BILITOT 0.6 04/22/2020 2239   BILITOT 0.3 09/15/2019 1237   GFRNONAA >60 03/28/2021 1931   GFRNONAA >89 12/18/2014 1157   GFRAA >60 11/09/2019 0854   GFRAA >89 12/18/2014 1157           ASSESSMENT AND PLAN  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   Trial of baclofen when an episode occurs 3.    If these become more frequent will first try Keppra and then try Botox if unsuccessful. 4.    Rtc 6 months or sooner if new or worsening symptoms  Dametri Ozburn A. Epimenio Foot, MD, [facility] 04/22/2021, 9:06 AM Certified in Neurology, Clinical Neurophysiology, Sleep Medicine, Pain Medicine and Neuroimaging  [facility] [address], [address] [place], [place] [address] [phone]

## 2021-05-05 ENCOUNTER — Other Ambulatory Visit: Payer: Self-pay | Admitting: Physician Assistant

## 2021-05-11 ENCOUNTER — Encounter (HOSPITAL_BASED_OUTPATIENT_CLINIC_OR_DEPARTMENT_OTHER): Payer: Self-pay | Admitting: *Deleted

## 2021-05-11 ENCOUNTER — Emergency Department (HOSPITAL_BASED_OUTPATIENT_CLINIC_OR_DEPARTMENT_OTHER): Payer: BC Managed Care – PPO

## 2021-05-11 ENCOUNTER — Emergency Department (HOSPITAL_BASED_OUTPATIENT_CLINIC_OR_DEPARTMENT_OTHER)
Admission: EM | Admit: 2021-05-11 | Discharge: 2021-05-12 | Disposition: A | Discharge status: Home / Self Care | Payer: BC Managed Care – PPO | Attending: Emergency Medicine | Admitting: Emergency Medicine

## 2021-05-11 ENCOUNTER — Emergency Department (HOSPITAL_BASED_OUTPATIENT_CLINIC_OR_DEPARTMENT_OTHER): Payer: BC Managed Care – PPO | Admitting: Radiology

## 2021-05-11 ENCOUNTER — Other Ambulatory Visit: Payer: Self-pay

## 2021-05-11 DIAGNOSIS — R0789 Other chest pain: Secondary | ICD-10-CM | POA: Diagnosis present

## 2021-05-11 DIAGNOSIS — Z7982 Long term (current) use of aspirin: Secondary | ICD-10-CM | POA: Insufficient documentation

## 2021-05-11 DIAGNOSIS — R0602 Shortness of breath: Secondary | ICD-10-CM | POA: Diagnosis not present

## 2021-05-11 DIAGNOSIS — I2510 Atherosclerotic heart disease of native coronary artery without angina pectoris: Secondary | ICD-10-CM | POA: Diagnosis not present

## 2021-05-11 DIAGNOSIS — K219 Gastro-esophageal reflux disease without esophagitis: Secondary | ICD-10-CM | POA: Insufficient documentation

## 2021-05-11 DIAGNOSIS — E119 Type 2 diabetes mellitus without complications: Secondary | ICD-10-CM | POA: Diagnosis not present

## 2021-05-11 DIAGNOSIS — R110 Nausea: Secondary | ICD-10-CM | POA: Diagnosis not present

## 2021-05-11 DIAGNOSIS — I10 Essential (primary) hypertension: Secondary | ICD-10-CM | POA: Insufficient documentation

## 2021-05-11 DIAGNOSIS — Z951 Presence of aortocoronary bypass graft: Secondary | ICD-10-CM | POA: Insufficient documentation

## 2021-05-11 DIAGNOSIS — Z79899 Other long term (current) drug therapy: Secondary | ICD-10-CM | POA: Insufficient documentation

## 2021-05-11 DIAGNOSIS — R42 Dizziness and giddiness: Secondary | ICD-10-CM | POA: Insufficient documentation

## 2021-05-11 DIAGNOSIS — R079 Chest pain, unspecified: Secondary | ICD-10-CM

## 2021-05-11 LAB — CBC
HCT: 45.6 % (ref 39.0–52.0)
Hemoglobin: 14.4 g/dL (ref 13.0–17.0)
MCH: 27.6 pg (ref 26.0–34.0)
MCHC: 31.6 g/dL (ref 30.0–36.0)
MCV: 87.5 fL (ref 80.0–100.0)
Platelets: 232 10*3/uL (ref 150–400)
RBC: 5.21 MIL/uL (ref 4.22–5.81)
RDW: 13.1 % (ref 11.5–15.5)
WBC: 8.1 10*3/uL (ref 4.0–10.5)
nRBC: 0 % (ref 0.0–0.2)

## 2021-05-11 LAB — TROPONIN I (HIGH SENSITIVITY)
Troponin I (High Sensitivity): 3 ng/L (ref <18)
Troponin I (High Sensitivity): 3 ng/L (ref <18)

## 2021-05-11 LAB — BASIC METABOLIC PANEL
Anion gap: 10 (ref 5–15)
BUN: 14 mg/dL (ref 6–20)
CO2: 25 mmol/L (ref 22–32)
Calcium: 9.4 mg/dL (ref 8.9–10.3)
Chloride: 103 mmol/L (ref 98–111)
Creatinine, Ser: 1.11 mg/dL (ref 0.61–1.24)
GFR, Estimated: >60 mL/min (ref >60)
Glucose, Bld: 98 mg/dL (ref 70–99)
Potassium: 3.9 mmol/L (ref 3.5–5.1)
Sodium: 138 mmol/L (ref 135–145)

## 2021-05-11 MED ORDER — PANTOPRAZOLE SODIUM 40 MG IV SOLR
40.0000 mg | Freq: Once | INTRAVENOUS | Status: AC
  Administered 2021-05-11: 23:00:00 40 mg via INTRAVENOUS
  Filled 2021-05-11: qty 40

## 2021-05-11 MED ORDER — IOHEXOL 350 MG/ML SOLN
100.0000 mL | Freq: Once | INTRAVENOUS | Status: AC | PRN
  Administered 2021-05-11: 23:00:00 85 mL via INTRAVENOUS

## 2021-05-11 MED ORDER — OMEPRAZOLE 20 MG PO CPDR: 20.0000 mg | DELAYED_RELEASE_CAPSULE | Freq: Every day | ORAL | 0 refills | Status: DC

## 2021-05-11 NOTE — ED Provider Notes (Signed)
[facility] EMERGENCY DEPT Provider Note   CSN: [id number] Arrival date & time: 05/11/21  2002     History Chief Complaint  Patient presents with   Chest Pain    Danny Brewer is a 50 y.o. male.  HPI Patient reports he developed chest pain starting yesterday it came on quite abruptly.  He reports its been a fairly constant waxing waning pain that is felt in his central chest slightly to the left under the breast.  No radiation into the back.  Somewhat worse with deep breath.  Nothing has improved the symptoms.  Patient's felt slightly short of breath, my dizziness and nausea.  Patient reports he had similar symptoms both with his heart problems before he had his bypass surgery as well as with reflux.  He reports he has not been on any reflux medication for over a month.  He noted 3 days ago that he got a sudden pain in his right calf he reports it felt like a big cramp but then ached and was sort of pressure.  He reports that is improving now.  No history of PE or DVT.    Past Medical History:  Diagnosis Date   BPH (benign prostatic hyperplasia)    Carotid artery disease (HCC)    [place] 5/22: Bilat 1-39    Complication of anesthesia    Pt verble and "prophecizes" after anesthesia   Coronary Artery Disease cardiologist--- dr cooper   Coronary CTA 09/2018: pLAD 70-90; pD1+pD2 70-90; mRCA mixed plaque - cannot rule out 70-90; FFR suggests hemodynamically significant stenosis // LHC 10/2018: 2v CAD >> s/p  CABG (L-LAD, L radial-RI, S-D2, S-RCA) with Dr. 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 04/22/2021   Left facial pain 04/22/2021   Carotid artery disease (HCC)    Erectile dysfunction 10/03/2020   Essential hypertension 10/31/2018   Hyperlipidemia 10/31/2018   Type 2 diabetes mellitus without complication, without long-term current use of insulin (HCC) 10/31/2018   Coronary artery disease 10/17/2018   Obstructive sleep apnea 10/15/2018   CAD (coronary artery disease), native coronary artery 10/14/2018   Chest pain with high risk of acute coronary syndrome 09/18/2018   Abnormal MRI of head 03/31/2017   Transient alteration of awareness 03/31/2017   Snoring 03/31/2017   Excessive daytime sleepiness 03/31/2017   Back pain    Paresthesias 08/26/2015   Confusion 08/26/2015   Sensory disturbance 08/26/2015   Atypical chest pain 08/26/2015   Dizziness and giddiness 06/06/2013   Viral illness 06/06/2013   GERD (gastroesophageal reflux disease) 06/06/2013    Past Surgical History:  Procedure Laterality Date   CORONARY ARTERY BYPASS GRAFT N/A 10/17/2018   Procedure: CORONARY ARTERY BYPASS GRAFTING (CABG), ON PUMP, TIMES FOUR, USING LIMA TO LAD, ENDOSCOPICALLY HARVESTED GREATER SAPHENOUS VEIN TO DIAG 2, SVG TO DISTAL RCA, AND LEFT RADIAL TO RAMUS INTERMEDIUS;  Surgeon: [name],  MD;  Location: [facility] OR;  Service: Open Heart Surgery;  Laterality: N/A;   ENDOVEIN HARVEST OF GREATER SAPHENOUS VEIN Right 10/17/2018   Procedure: Mack Guise Of Greater Saphenous Vein;  Surgeon: [name], MD;  Location: [facility] OR;  Service: Open Heart Surgery;  Laterality: Right;   HAND SURGERY Left 1987   complex laceration   INGUINAL HERNIA REPAIR Bilateral 05/27/2015   Procedure: LAPAROSCOPIC BILATERAL  INGUINAL HERNIA REPAIRS WITH MESH;  Surgeon: Karie Soda, MD;  Location: [facility] ORS;  Service: General;  Laterality: Bilateral;   KNEE ARTHROSCOPY Left 1991   LEFT HEART CATH AND CORONARY ANGIOGRAPHY N/A 10/14/2018    Procedure: LEFT HEART CATH AND CORONARY ANGIOGRAPHY;  Surgeon: Lyn Records, MD;  Location: [facility] INVASIVE CV LAB;  Service: Cardiovascular;  Laterality: N/A;   PENILE PROSTHESIS IMPLANT N/A 10/03/2020   Procedure: PLACEMENT OF PENILE PROTHESIS INFLATABLE;  Surgeon: Marcine Matar, MD;  Location: [facility];  Service: Urology;  Laterality: N/A;  pubis area   RADIAL ARTERY HARVEST Left 10/17/2018   Procedure: RADIAL ARTERY HARVEST;  Surgeon: [name], MD;  Location: [facility] OR;  Service: Open Heart Surgery;  Laterality: Left;   SHOULDER CLOSED REDUCTION Left 09/04/2019   Procedure: CLOSED MANIPULATION SHOULDER;  Surgeon: Dannielle Huh, MD;  Location: [facility] ORS;  Service: Orthopedics;  Laterality: Left;   SHOULDER SURGERY Left 2022   TEE WITHOUT CARDIOVERSION N/A 10/17/2018   Procedure: TRANSESOPHAGEAL ECHOCARDIOGRAM (TEE);  Surgeon: [name], MD;  Location: [facility] OR;  Service: Open Heart Surgery;  Laterality: N/A;       Family History  Problem Relation Age of Onset   Hypertension Mother    Cancer Father    Heart failure Father    Sudden death Brother    Heart attack Brother    Hypertension Sister    Heart murmur Sister     Social History   Tobacco Use   Smoking status: Never   Smokeless tobacco: Never  Vaping Use   Vaping Use: Never used  Substance Use Topics   Alcohol use: No   Drug use: Never    Home Medications Prior to Admission medications   Medication Sig Start Date End Date Taking? Authorizing Provider  omeprazole (PRILOSEC) 20 MG capsule Take 1 capsule (20 mg total) by mouth daily. 05/11/21  Yes Arby Barrette, MD  acetaminophen (TYLENOL) 325 MG tablet Take 650 mg by mouth every 6 (six) hours as needed for moderate pain.    [provider]  Alirocumab (PRALUENT) 75 MG/ML SOAJ INJECT 1 ML INTO THE SKIN EVERY 14 (FOURTEEN) DAYS. 02/24/21   Tonny Bollman, MD  amLODipine (NORVASC) 5 MG tablet Take 1 tablet (5 mg total) by mouth  daily. Patient taking differently: Take 5 mg by mouth daily. 08/22/20 02/21/21  Tereso Newcomer T, PA-C  aspirin 81 MG tablet Take 1 tablet (81 mg total) by mouth daily. 10/11/18   Tereso Newcomer T, PA-C  baclofen (LIORESAL) 10 MG tablet Take 1 tablet (10 mg total) by mouth 3 (three) times daily. 04/22/21   Sater, Pearletha Furl, MD  ezetimibe (ZETIA) 10 MG tablet TAKE 1 TABLET BY MOUTH EVERY DAY Patient taking differently: Take 10 mg by mouth. 08/22/19   Marjie Skiff E, PA-C  fluconazole (DIFLUCAN) 100 MG tablet Take 1 tablet (100 mg total) by mouth daily. X 7 days 10/04/20   Marcine Matar, MD  oxyCODONE (ROXICODONE) 5 MG immediate release tablet Take 1 tablet (5 mg total) by mouth every 4 (  four) hours as needed for severe pain. 10/04/20 10/04/21  Marcine Matar, MD  sitaGLIPtin (JANUVIA) 100 MG tablet Take 100 mg by mouth daily.    [provider]  tamsulosin (FLOMAX) 0.4 MG CAPS capsule Take 0.4 mg by mouth daily.    [provider]    Allergies    Antihistamines, diphenhydramine-type; Demerol; Morphine and related; Compazine [prochlorperazine]; Metoprolol; and Statins  Review of Systems   Review of Systems 10 systems reviewed and negative except as per HPI Physical Exam Updated Vital Signs BP (!) 129/92   Pulse 77   Temp 98 F (36.7 C)   Resp 16   Ht 5\' 9"  (1.753 m)   Wt 88.5 kg   SpO2 100%   BMI 28.80 kg/m   Physical Exam Constitutional:      Appearance: Normal appearance.  HENT:     Mouth/Throat:     Pharynx: Oropharynx is clear.  Eyes:     Extraocular Movements: Extraocular movements intact.  Cardiovascular:     Rate and Rhythm: Normal rate and regular rhythm.  Pulmonary:     Effort: Pulmonary effort is normal.     Breath sounds: Normal breath sounds.  Chest:     Chest wall: No tenderness.  Abdominal:     General: There is no distension.     Palpations: Abdomen is soft.     Tenderness: There is no abdominal tenderness. There is no guarding.   Musculoskeletal:        General: No swelling or tenderness. Normal range of motion.     Right lower leg: No edema.     Left lower leg: No edema.     Comments: Legs are symmetric.  No objective swelling.  No significantly reproducible pain in the calf or popliteal fossa.  Skin:    General: Skin is warm and dry.  Neurological:     General: No focal deficit present.     Mental Status: He is alert and oriented to person, place, and time.     Coordination: Coordination normal.  Psychiatric:        Mood and Affect: Mood normal.    ED Results / Procedures / Treatments   Labs (all labs ordered are listed, but only abnormal results are displayed) Labs Reviewed  BASIC METABOLIC PANEL  CBC  TROPONIN I (HIGH SENSITIVITY)  TROPONIN I (HIGH SENSITIVITY)    EKG EKG Interpretation  Date/Time:  Sunday May 11 2021 20:10:57 EST Ventricular Rate:  82 PR Interval:  156 QRS Duration: 90 QT Interval:  358 QTC Calculation: 418 R Axis:   49 Text Interpretation: Normal sinus rhythm with sinus arrhythmia Normal ECG no sig change from previous Confirmed by 06[birth year]1985 [phone]) on 05/11/2021 10:48:30 PM  Radiology DG Chest 2 View  Result Date: 05/11/2021 CLINICAL DATA:  Chest pain and shortness of breath EXAM: CHEST - 2 VIEW COMPARISON:  11/09/2019 FINDINGS: Cardiac shadow is within normal limits. Lungs are well aerated bilaterally. No focal infiltrate or effusion is seen. Postsurgical changes are again noted. IMPRESSION: No active cardiopulmonary disease. Electronically Signed   By: 01/09/2020 M.D.   On: 05/11/2021 21:10   CT Angio Chest PE W/Cm &/Or Wo Cm  Result Date: 05/11/2021 CLINICAL DATA:  Left chest pain, dyspnea EXAM: CT ANGIOGRAPHY CHEST WITH CONTRAST TECHNIQUE: Multidetector CT imaging of the chest was performed using the standard protocol during bolus administration of intravenous contrast. Multiplanar CT image reconstructions and MIPs were obtained to evaluate the vascular  anatomy. CONTRAST:  85mL OMNIPAQUE IOHEXOL 350 MG/ML SOLN COMPARISON:  11/19/2018 FINDINGS: Cardiovascular: There is adequate opacification of the pulmonary arterial tree. No intraluminal filling defect identified to suggest acute pulmonary embolism. The central pulmonary arteries are of normal caliber. Coronary artery bypass grafting has been performed. Cardiac size within normal limits. No pericardial effusion. The thoracic aorta is unremarkable. Mediastinum/Nodes: No enlarged mediastinal, hilar, or axillary lymph nodes. Thyroid gland, trachea, and esophagus demonstrate no significant findings. Lungs/Pleura: Lungs are clear. No pleural effusion or pneumothorax. Upper Abdomen: 3 mm nonobstructing calculus within the visualized upper pole of the left kidney. No acute abnormality. Musculoskeletal: No acute bone abnormality. No lytic or blastic bone lesion. Review of the MIP images confirms the above findings. IMPRESSION: No pulmonary embolism.  No acute intrathoracic pathology identified. Mild nonobstructing left nephrolithiasis. Electronically Signed   By: Helyn Numbers M.D.   On: 05/11/2021 23:36    Procedures Procedures   Medications Ordered in ED Medications  pantoprazole (PROTONIX) injection 40 mg (40 mg Intravenous Given 05/11/21 2302)  iohexol (OMNIPAQUE) 350 MG/ML injection 100 mL (85 mLs Intravenous Contrast Given 05/11/21 2310)    ED Course  I have reviewed the triage vital signs and the nursing notes.  Pertinent labs & imaging results that were available during my care of the patient were reviewed by me and considered in my medical decision making (see chart for details).    MDM Rules/Calculators/A&P                           Patient has known cardiac history.  He described pain waxing waning since yesterday.  Concern for possible ACS.  2 sets of troponins negative.  EKG unchanged from previous.  Patient does endorse significant pain improvement after dose of Protonix.  Possibly reflux  related.  Also I had concern for PE patient described pleuritic quality of pain in the left anterior chest.  He described calf pain and swelling 2 days before onset of symptoms.  PE study is negative.  Objectively, there is not significant swelling or edema in the lower extremities.  Heart rate and oxygen saturation normal.  At this time very low suspicion after work-up for DVT or PE.  Patient continues to have cardiac risk factors but does not appear to be having ACS.  Patient will be following up with his cardiologist tomorrow.  Return precautions reviewed.  Patient is counseled to restart omeprazole for 2 weeks. Final Clinical Impression(s) / ED Diagnoses Final diagnoses:  Nonspecific chest pain    Rx / DC Orders ED Discharge Orders          Ordered    omeprazole (PRILOSEC) 20 MG capsule  Daily        05/11/21 2358             Arby Barrette, MD 05/12/21 0004

## 2021-05-11 NOTE — Discharge Instructions (Signed)
1.  Follow-up with your cardiologist tomorrow as planned. 2.  Start omeprazole daily for the next 2 weeks. 3.  Return to emergency department if you are getting new worsening or concerning symptoms.

## 2021-05-11 NOTE — ED Triage Notes (Signed)
Pain in the left chest area that started yesterday, comes and goes through the day. "Tightness". SOB, nausea, dizziness, palpitations.

## 2021-05-12 ENCOUNTER — Other Ambulatory Visit: Payer: Self-pay | Admitting: Urology

## 2021-05-15 ENCOUNTER — Other Ambulatory Visit: Payer: Self-pay

## 2021-05-15 DIAGNOSIS — E7800 Pure hypercholesterolemia, unspecified: Secondary | ICD-10-CM

## 2021-05-15 DIAGNOSIS — I2510 Atherosclerotic heart disease of native coronary artery without angina pectoris: Secondary | ICD-10-CM

## 2021-05-15 LAB — LIPID PANEL
Chol/HDL Ratio: 4.4 ratio (ref 0.0–5.0)
Cholesterol, Total: 240 mg/dL — ABNORMAL HIGH (ref 100–199)
HDL: 54 mg/dL (ref >39)
LDL Chol Calc (NIH): 174 mg/dL — ABNORMAL HIGH (ref 0–99)
Triglycerides: 71 mg/dL (ref 0–149)
VLDL Cholesterol Cal: 12 mg/dL (ref 5–40)

## 2021-05-17 ENCOUNTER — Other Ambulatory Visit: Payer: BC Managed Care – PPO

## 2021-05-26 ENCOUNTER — Other Ambulatory Visit: Payer: Self-pay | Admitting: *Deleted

## 2021-05-26 MED ORDER — AMLODIPINE BESYLATE 5 MG PO TABS: 5.0000 mg | ORAL_TABLET | Freq: Every day | ORAL | 2 refills | Status: DC

## 2021-05-26 MED ORDER — EZETIMIBE 10 MG PO TABS: 10.0000 mg | ORAL_TABLET | Freq: Every day | ORAL | 2 refills | Status: DC

## 2021-05-29 ENCOUNTER — Telehealth: Payer: Self-pay | Admitting: Cardiology

## 2021-05-29 NOTE — Telephone Encounter (Signed)
Pt called with new diagnosis of covid and wanted the infusion.  He has already been prescribed Lagarrio (molnuperavir)  I explained it could take 10 days to recover but he should contact his PCP for further treatment that we did not order these meds.  Pt agreeable.

## 2021-05-30 ENCOUNTER — Other Ambulatory Visit: Payer: Self-pay

## 2021-05-30 DIAGNOSIS — E7800 Pure hypercholesterolemia, unspecified: Secondary | ICD-10-CM

## 2021-05-30 DIAGNOSIS — Z79899 Other long term (current) drug therapy: Secondary | ICD-10-CM

## 2021-06-10 ENCOUNTER — Ambulatory Visit: Payer: BC Managed Care – PPO | Admitting: Neurology

## 2021-06-13 NOTE — Patient Instructions (Signed)
DUE TO COVID-19 ONLY ONE VISITOR IS ALLOWED TO COME WITH YOU AND STAY IN THE WAITING ROOM ONLY DURING PRE OP AND PROCEDURE DAY OF SURGERY IF YOU ARE GOING HOME AFTER SURGERY. IF YOU ARE SPENDING THE NIGHT 2 PEOPLE MAY VISIT WITH YOU IN YOUR PRIVATE ROOM AFTER SURGERY UNTIL VISITING  HOURS ARE OVER AT 800 PM AND 1  VISITOR  MAY  SPEND THE NIGHT.  Marland Kitchen               Danny Brewer     Your procedure is scheduled on: 07/07/21   Report to [facility] Main  Entrance   Report to admitting at  6:45 AM     Call this number if you have problems the morning of surgry [phone]   Remember: Do not eat food or drink :After Midnight the night before your surgery,       CLEAR LIQUID DIET   Foods Allowed                                                                     Foods Excluded  Coffee and tea, regular and decaf                             liquids that you cannot  Plain Jell-O any favor except red or purple                                           see through such as: Fruit ices (not with fruit pulp)                                     milk, soups, orange juice  Iced Popsicles                                    All solid food Carbonated beverages, regular and diet                                    Cranberry, grape and apple juices Sports drinks like Gatorade Lightly seasoned clear broth or consume(fat free) Sugar    BRUSH YOUR TEETH MORNING OF SURGERY AND RINSE YOUR MOUTH OUT, NO CHEWING GUM CANDY OR MINTS.     Take these medicines the morning of surgery with A SIP OF WATER: Amlodipine, Tamsulosin, Omeprazole                                You may not have any metal on your body including              piercings  Do not wear jewelry, lotions, powders or deodorant             Men may shave face and neck.   Do not bring valuables to  the hospital. [place] IS NOT             RESPONSIBLE   FOR VALUABLES.  Contacts, dentures or bridgework may not be worn into  surgery.     Patients discharged the day of surgery will not be allowed to drive home.  IF YOU ARE HAVING SURGERY AND GOING HOME THE SAME DAY, YOU MUST HAVE AN ADULT TO DRIVE YOU HOME AND BE WITH YOU FOR 24 HOURS. YOU MAY GO HOME BY TAXI OR UBER OR ORTHERWISE, BUT AN ADULT MUST ACCOMPANY YOU HOME AND STAY WITH YOU FOR 24 HOURS.  Name and phone number of your driver:  Special Instructions: N/A              Please read over the following fact sheets you were given: _____________________________________________________________________             [facility] - Preparing for Surgery Before surgery, you can play an important role.  Because skin is not sterile, your skin needs to be as free of germs as possible.  You can reduce the number of germs on your skin by washing with CHG (chlorahexidine gluconate) soap before surgery.  CHG is an antiseptic cleaner which kills germs and bonds with the skin to continue killing germs even after washing. Please DO NOT use if you have an allergy to CHG or antibacterial soaps.  If your skin becomes reddened/irritated stop using the CHG and inform your nurse when you arrive at Short Stay.  You may shave your face/neck. Please follow these instructions carefully:  1.  Shower with CHG Soap the night before surgery and the  morning of Surgery.  2.  If you choose to wash your hair, wash your hair first as usual with your  normal  shampoo.  3.  After you shampoo, rinse your hair and body thoroughly to remove the  shampoo.                            4.  Use CHG as you would any other liquid soap.  You can apply chg directly  to the skin and wash                       Gently with a scrungie or clean washcloth.  5.  Apply the CHG Soap to your body ONLY FROM THE NECK DOWN.   Do not use on face/ open                           Wound or open sores. Avoid contact with eyes, ears mouth and genitals (private parts).                       Wash face,  Genitals (private parts)  with your normal soap.             6.  Wash thoroughly, paying special attention to the area where your surgery  will be performed.  7.  Thoroughly rinse your body with warm water from the neck down.  8.  DO NOT shower/wash with your normal soap after using and rinsing off  the CHG Soap.                9.  Pat yourself dry with a clean towel.            10.  Wear  clean pajamas.            11.  Place clean sheets on your bed the night of your first shower and do not  sleep with pets. Day of Surgery : Do not apply any lotions/deodorants the morning of surgery.  Please wear clean clothes to the hospital/surgery center.  FAILURE TO FOLLOW THESE INSTRUCTIONS MAY RESULT IN THE CANCELLATION OF YOUR SURGERY PATIENT SIGNATURE_________________________________  NURSE SIGNATURE__________________________________  ________________________________________________________________________

## 2021-06-16 ENCOUNTER — Encounter (HOSPITAL_COMMUNITY): Payer: Self-pay

## 2021-06-16 ENCOUNTER — Encounter (HOSPITAL_COMMUNITY)
Admission: RE | Admit: 2021-06-16 | Discharge: 2021-06-16 | Disposition: A | Discharge status: Home / Self Care | Payer: BC Managed Care – PPO | Source: Ambulatory Visit | Attending: Urology | Admitting: Urology

## 2021-06-16 ENCOUNTER — Other Ambulatory Visit: Payer: Self-pay

## 2021-06-16 VITALS — BP 144/98 | HR 82 | Temp 98.6°F | Resp 18 | Ht 69.0 in | Wt 196.0 lb

## 2021-06-16 DIAGNOSIS — E119 Type 2 diabetes mellitus without complications: Secondary | ICD-10-CM | POA: Diagnosis not present

## 2021-06-16 DIAGNOSIS — Z01812 Encounter for preprocedural laboratory examination: Secondary | ICD-10-CM | POA: Diagnosis present

## 2021-06-16 LAB — BASIC METABOLIC PANEL
Anion gap: 7 (ref 5–15)
BUN: 13 mg/dL (ref 6–20)
CO2: 25 mmol/L (ref 22–32)
Calcium: 8.9 mg/dL (ref 8.9–10.3)
Chloride: 107 mmol/L (ref 98–111)
Creatinine, Ser: 1.32 mg/dL — ABNORMAL HIGH (ref 0.61–1.24)
GFR, Estimated: >60 mL/min (ref >60)
Glucose, Bld: 116 mg/dL — ABNORMAL HIGH (ref 70–99)
Potassium: 4.3 mmol/L (ref 3.5–5.1)
Sodium: 139 mmol/L (ref 135–145)

## 2021-06-16 LAB — GLUCOSE, CAPILLARY: Glucose-Capillary: 113 mg/dL — ABNORMAL HIGH (ref 70–99)

## 2021-06-16 LAB — HEMOGLOBIN A1C
Hgb A1c MFr Bld: 6.3 % — ABNORMAL HIGH (ref 4.8–5.6)
Mean Plasma Glucose: 134.11 mg/dL

## 2021-06-16 LAB — CBC
HCT: 45.9 % (ref 39.0–52.0)
Hemoglobin: 14.5 g/dL (ref 13.0–17.0)
MCH: 28 pg (ref 26.0–34.0)
MCHC: 31.6 g/dL (ref 30.0–36.0)
MCV: 88.6 fL (ref 80.0–100.0)
Platelets: 294 10*3/uL (ref 150–400)
RBC: 5.18 MIL/uL (ref 4.22–5.81)
RDW: 12.9 % (ref 11.5–15.5)
WBC: 5.4 10*3/uL (ref 4.0–10.5)
nRBC: 0 % (ref 0.0–0.2)

## 2021-06-16 NOTE — Progress Notes (Signed)
COVID test- NA Pt had covid 12/16/2   PCP - Dr. Karna Dupes Cardiologist - Dr. Ellene Route  Chest x-ray - 05/11/21-epic EKG - 05/14/21-epic Stress Test - no ECHO - 2020 Cardiac Cath - 2020 and CABG x4 2020 Pacemaker/ICD device last checked:NA  Sleep Study - yes negative findings CPAP - no  Fasting Blood Sugar - 72-105 Checks Blood Sugar _QD____ times a day  Blood Thinner Instructions:ASA 81/ Dr. Rennis Golden Aspirin Instructions:none I told him to call his MD Last Dose:Pt will stop 06/30/21  Anesthesia review: yes  Patient denies shortness of breath, fever, cough and chest pain at PAT appointment Pt has no SOB with any activities  Patient verbalized understanding of instructions that were given to them at the PAT appointment. Patient was also instructed that they will need to review over the PAT instructions again at home before surgery. Yes

## 2021-06-27 NOTE — Progress Notes (Signed)
Anesthesia Chart Review   Case: [id number] Date/Time: 07/07/21 0900   Procedures:      REMOVAL AND REPLACEMENT OF COLOPLAST TITAN PENILE PROSTHESIS     EXPLORATION OF PENILE PROSTHESIS   Anesthesia type: General   Pre-op diagnosis: MALFUNCTIONING PENILE PROSTHESIS   Location: [facility]   Surgeons: Marcine Matar, MD       DISCUSSION:50 y.o. never smoker with h/o HTN, GERD, DM II (A1C 6.3), CAD (CABG 09/2018), BPH, malfunctioning penile prothesis scheduled for above procedure 07/07/2021 with Dr. Marcine Matar.   Pt last seen by cardiology 02/21/2021. Per preoeprative evaluation 03/03/2021, "Chart reviewed as part of pre-operative protocol coverage. Pre-op clearance already addressed by colleagues in earlier phone notes. To summarize recommendations:   - Per Vin Bhagat PA-C, "The patient was doing well on cardiac stand point when seen by Dr. Rennis Golden 02/21/21. Given past medical history and time since last visit, based on ACC/AHA guidelines, Danny Brewer would be at acceptable risk for the planned procedure without further cardiovascular testing. "   - Per Dr. Rennis Golden, "Ok to hold aspirin 7 days prior to procedure if necessary, restart after""  Anticipate pt can proceed with planned procedure barring acute status change.   VS: BP (!) 144/98    Pulse 82    Temp 37 C (Oral)    Resp 18    Ht 5\' 9"  (1.753 m)    Wt 88.9 kg    SpO2 100%    BMI 28.94 kg/m   PROVIDERS: , MD is PCP   Ileana Ladd, MD is Cardiologist  LABS: Labs reviewed: Acceptable for surgery. (all labs ordered are listed, but only abnormal results are displayed)  Labs Reviewed  HEMOGLOBIN A1C - Abnormal; Notable for the following components:      Result Value   Hgb A1c MFr Bld 6.3 (*)    All other components within normal limits  BASIC METABOLIC PANEL - Abnormal; Notable for the following components:   Glucose, Bld 116 (*)    Creatinine, Ser 1.32 (*)    All other components within  normal limits  GLUCOSE, CAPILLARY - Abnormal; Notable for the following components:   Glucose-Capillary 113 (*)    All other components within normal limits  CBC     IMAGES:   EKG: 05/11/2021 Rate 82 bpm  NSR with sinus arrhythmia  CV: Echo 09/19/2018 1. The left ventricle has normal systolic function, with an ejection  fraction of 60-65%. The cavity size was normal. Left ventricular diastolic  function could not be evaluated.   2. The right ventricle has normal systolc function. The cavity was  normal. There is no increase in right ventricular wall thickness.   3. Aortic valve regurgitation was not assessed by color flow Doppler.  Past Medical History:  Diagnosis Date   BPH (benign prostatic hyperplasia)    Carotid artery disease (HCC)    09/21/2018 5/22: Bilat 1-39    Complication of anesthesia    Pt verble and "prophecizes" after anesthesia   Coronary Artery Disease cardiologist--- dr cooper   Coronary CTA 09/2018: pLAD 70-90; pD1+pD2 70-90; mRCA mixed plaque - cannot rule out 70-90; FFR suggests hemodynamically significant stenosis // LHC 10/2018: 2v CAD >> s/p  CABG (L-LAD, L radial-RI, S-D2, S-RCA) with Dr. 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   LIMA--LAD;  SVG--D2;  SVG--dRCA;  left radial --- RI   Type 2 diabetes mellitus (HCC)    followed by pcp   (10-01-2020 checks blood sugar 3 times wkly,  fasting sugar-- 72--105)   Wears glasses     Past Surgical History:  Procedure Laterality Date   CORONARY ARTERY BYPASS GRAFT N/A 10/17/2018   Procedure: CORONARY ARTERY BYPASS GRAFTING (CABG), ON PUMP, TIMES FOUR, USING LIMA TO LAD, ENDOSCOPICALLY HARVESTED GREATER SAPHENOUS VEIN TO DIAG 2, SVG TO DISTAL RCA, AND LEFT RADIAL TO RAMUS INTERMEDIUS;  Surgeon: [name], MD;  Location: [facility];  Service: Open Heart  Surgery;  Laterality: N/A;   ENDOVEIN HARVEST OF GREATER SAPHENOUS VEIN Right 10/17/2018   Procedure: Mack Guise Of Greater Saphenous Vein;  Surgeon: [name], MD;  Location: [facility] OR;  Service: Open Heart Surgery;  Laterality: Right;   HAND SURGERY Left 1987   complex laceration   INGUINAL HERNIA REPAIR Bilateral 05/27/2015   Procedure: LAPAROSCOPIC BILATERAL  INGUINAL HERNIA REPAIRS WITH MESH;  Surgeon: Karie Soda, MD;  Location: [facility];  Service: General;  Laterality: Bilateral;   KNEE ARTHROSCOPY Left 1991   LEFT HEART CATH AND CORONARY ANGIOGRAPHY N/A 10/14/2018   Procedure: LEFT HEART CATH AND CORONARY ANGIOGRAPHY;  Surgeon: Lyn Records, MD;  [facility]: [facility];  Service: Cardiovascular;  Laterality: N/A;   PENILE PROSTHESIS IMPLANT N/A 10/03/2020   Procedure: PLACEMENT OF PENILE PROTHESIS INFLATABLE;  Surgeon: Marcine Matar, MD;  Location: [facility];  Service: Urology;  Laterality: N/A;  pubis area   RADIAL ARTERY HARVEST Left 10/17/2018   Procedure: RADIAL ARTERY HARVEST;  Surgeon: [name], MD;  Location: [facility] OR;  Service: Open Heart Surgery;  Laterality: Left;   SHOULDER CLOSED REDUCTION Left 09/04/2019   Procedure: CLOSED MANIPULATION SHOULDER;  Surgeon: Dannielle Huh, MD;  Location: [facility];  Service: Orthopedics;  Laterality: Left;   SHOULDER SURGERY Left 2022   TEE WITHOUT CARDIOVERSION N/A 10/17/2018   Procedure: TRANSESOPHAGEAL ECHOCARDIOGRAM (TEE);  Surgeon: [name], MD;  Location: [facility] OR;  Service: Open Heart Surgery;  Laterality: N/A;    MEDICATIONS:  acetaminophen (TYLENOL) 500 MG tablet   Alirocumab (PRALUENT) 75 MG/ML SOAJ   amLODipine (NORVASC) 5 MG tablet   aspirin 81 MG tablet   baclofen (LIORESAL) 10 MG tablet   ezetimibe (ZETIA) 10 MG tablet   omeprazole (PRILOSEC) 20 MG capsule   sitaGLIPtin (JANUVIA) 100 MG tablet   tamsulosin (FLOMAX) 0.4 MG CAPS capsule   No current facility-administered  medications for this encounter.   Jodell Cipro Ward, PA-C [facility] [phone]

## 2021-07-05 NOTE — Anesthesia Preprocedure Evaluation (Addendum)
Anesthesia Evaluation  Patient identified by MRN, date of birth, ID band Patient awake    Reviewed: Allergy & Precautions, NPO status , Patient's Chart, lab work & pertinent test results  History of Anesthesia Complications (+) Emergence Delirium and history of anesthetic complications (emergence delirium w/ colonoscopy)  Airway Mallampati: III  TM Distance: >3 FB Neck ROM: Full    Dental no notable dental hx. (+) Teeth Intact, Dental Advisory Given   Pulmonary sleep apnea (mild OSA no CPAP) ,    Pulmonary exam normal breath sounds clear to auscultation       Cardiovascular hypertension, Pt. on medications + CAD and + CABG (CABGx4 2020)  Normal cardiovascular exam Rhythm:Regular Rate:Normal  Echo 2020: 1. The left ventricle has normal systolic function, with an ejection  fraction of 60-65%. The cavity size was normal. Left ventricular diastolic function could not be evaluated.  2. The right ventricle has normal systolc function. The cavity was normal. There is no increase in right ventricular wall thickness.  3. Aortic valve regurgitation was not assessed by color flow Doppler.    Neuro/Psych negative neurological ROS  negative psych ROS   GI/Hepatic Neg liver ROS, GERD  Medicated and Controlled,  Endo/Other  diabetes, Well Controlled, Type 2, Oral Hypoglycemic Agentsa1c 6.3  Renal/GU negative Renal ROS  negative genitourinary   Musculoskeletal  (+) Arthritis , Osteoarthritis,    Abdominal   Peds  Hematology negative hematology ROS (+)   Anesthesia Other Findings   Reproductive/Obstetrics Malfunctioning penile prosthesis                             Anesthesia Physical Anesthesia Plan  ASA: 3  Anesthesia Plan: General   Post-op Pain Management:    Induction: Intravenous  PONV Risk Score and Plan: 3 and Ondansetron, Dexamethasone, Midazolam and Treatment may vary due to age or  medical condition  Airway Management Planned: LMA  Additional Equipment: None  Intra-op Plan:   Post-operative Plan: Extubation in OR  Informed Consent: I have reviewed the patients History and Physical, chart, labs and discussed the procedure including the risks, benefits and alternatives for the proposed anesthesia with the patient or authorized representative who has indicated his/her understanding and acceptance.     Dental advisory given  Plan Discussed with: CRNA  Anesthesia Plan Comments: (precedex prior to emergence)       Anesthesia Quick Evaluation

## 2021-07-07 ENCOUNTER — Ambulatory Visit (HOSPITAL_COMMUNITY): Payer: BC Managed Care – PPO | Admitting: Physician Assistant

## 2021-07-07 ENCOUNTER — Observation Stay (HOSPITAL_COMMUNITY)
Admission: RE | Admit: 2021-07-07 | Discharge: 2021-07-08 | Disposition: A | Discharge status: Home / Self Care | Payer: BC Managed Care – PPO | Source: Ambulatory Visit | Attending: Urology | Admitting: Urology

## 2021-07-07 ENCOUNTER — Other Ambulatory Visit: Payer: Self-pay

## 2021-07-07 ENCOUNTER — Ambulatory Visit (HOSPITAL_COMMUNITY): Payer: BC Managed Care – PPO | Admitting: Anesthesiology

## 2021-07-07 ENCOUNTER — Encounter (HOSPITAL_COMMUNITY)
Admission: RE | Disposition: A | Discharge status: Home / Self Care | Payer: Self-pay | Source: Ambulatory Visit | Attending: Urology

## 2021-07-07 ENCOUNTER — Encounter (HOSPITAL_COMMUNITY): Payer: Self-pay | Admitting: Urology

## 2021-07-07 DIAGNOSIS — Z951 Presence of aortocoronary bypass graft: Secondary | ICD-10-CM | POA: Insufficient documentation

## 2021-07-07 DIAGNOSIS — I10 Essential (primary) hypertension: Secondary | ICD-10-CM | POA: Insufficient documentation

## 2021-07-07 DIAGNOSIS — Y732 Prosthetic and other implants, materials and accessory gastroenterology and urology devices associated with adverse incidents: Secondary | ICD-10-CM | POA: Diagnosis not present

## 2021-07-07 DIAGNOSIS — T83490A Other mechanical complication of penile (implanted) prosthesis, initial encounter: Secondary | ICD-10-CM | POA: Diagnosis present

## 2021-07-07 DIAGNOSIS — E119 Type 2 diabetes mellitus without complications: Secondary | ICD-10-CM | POA: Diagnosis not present

## 2021-07-07 DIAGNOSIS — I2510 Atherosclerotic heart disease of native coronary artery without angina pectoris: Secondary | ICD-10-CM | POA: Diagnosis not present

## 2021-07-07 DIAGNOSIS — N529 Male erectile dysfunction, unspecified: Secondary | ICD-10-CM | POA: Diagnosis present

## 2021-07-07 HISTORY — PX: REMOVAL OF PENILE PROSTHESIS: SHX6059

## 2021-07-07 HISTORY — PX: SCROTAL EXPLORATION: SHX2386

## 2021-07-07 LAB — CBC
HCT: 41.9 % (ref 39.0–52.0)
Hemoglobin: 13.5 g/dL (ref 13.0–17.0)
MCH: 28.2 pg (ref 26.0–34.0)
MCHC: 32.2 g/dL (ref 30.0–36.0)
MCV: 87.7 fL (ref 80.0–100.0)
Platelets: 210 10*3/uL (ref 150–400)
RBC: 4.78 MIL/uL (ref 4.22–5.81)
RDW: 13 % (ref 11.5–15.5)
WBC: 9.1 10*3/uL (ref 4.0–10.5)
nRBC: 0 % (ref 0.0–0.2)

## 2021-07-07 LAB — GLUCOSE, CAPILLARY
Glucose-Capillary: 112 mg/dL — ABNORMAL HIGH (ref 70–99)
Glucose-Capillary: 104 mg/dL — ABNORMAL HIGH (ref 70–99)
Glucose-Capillary: 153 mg/dL — ABNORMAL HIGH (ref 70–99)
Glucose-Capillary: 147 mg/dL — ABNORMAL HIGH (ref 70–99)

## 2021-07-07 SURGERY — REMOVAL, PENILE PROSTHESIS
Anesthesia: General | Site: Penis

## 2021-07-07 MED ORDER — CHLORHEXIDINE GLUCONATE 0.12 % MT SOLN
15.0000 mL | Freq: Once | OROMUCOSAL | Status: AC
  Administered 2021-07-07: 15 mL via OROMUCOSAL

## 2021-07-07 MED ORDER — INSULIN ASPART 100 UNIT/ML IJ SOLN: 0.0000 [IU] | Freq: Every day | INTRAMUSCULAR | Status: DC

## 2021-07-07 MED ORDER — LACTATED RINGERS IV SOLN: INTRAVENOUS | Status: DC

## 2021-07-07 MED ORDER — CIPROFLOXACIN HCL 500 MG PO TABS
500.0000 mg | ORAL_TABLET | Freq: Two times a day (BID) | ORAL | Status: DC
  Administered 2021-07-07 – 2021-07-08 (×2): 500 mg via ORAL
  Filled 2021-07-07 (×2): qty 1

## 2021-07-07 MED ORDER — ONDANSETRON HCL 4 MG/2ML IJ SOLN
INTRAMUSCULAR | Status: DC | PRN
  Administered 2021-07-07: 4 mg via INTRAVENOUS

## 2021-07-07 MED ORDER — SODIUM CHLORIDE 0.9 % IV SOLN: INTRAVENOUS | Status: DC

## 2021-07-07 MED ORDER — HYDROMORPHONE HCL 1 MG/ML IJ SOLN: 0.5000 mg | INTRAMUSCULAR | Status: DC | PRN

## 2021-07-07 MED ORDER — GENTAMICIN SULFATE 40 MG/ML IJ SOLN: 5.0000 mg/kg | INTRAVENOUS | Status: DC

## 2021-07-07 MED ORDER — AMISULPRIDE (ANTIEMETIC) 5 MG/2ML IV SOLN: 10.0000 mg | Freq: Once | INTRAVENOUS | Status: DC | PRN

## 2021-07-07 MED ORDER — LIDOCAINE 2% (20 MG/ML) 5 ML SYRINGE
INTRAMUSCULAR | Status: DC | PRN
  Administered 2021-07-07: 60 mg via INTRAVENOUS

## 2021-07-07 MED ORDER — OXYBUTYNIN CHLORIDE 5 MG PO TABS
5.0000 mg | ORAL_TABLET | Freq: Three times a day (TID) | ORAL | Status: DC | PRN
  Administered 2021-07-07 – 2021-07-08 (×2): 5 mg via ORAL
  Filled 2021-07-07 (×2): qty 1

## 2021-07-07 MED ORDER — FENTANYL CITRATE (PF) 100 MCG/2ML IJ SOLN
INTRAMUSCULAR | Status: AC
  Filled 2021-07-07: qty 2

## 2021-07-07 MED ORDER — BUPIVACAINE HCL 0.25 % IJ SOLN
INTRAMUSCULAR | Status: AC
  Filled 2021-07-07: qty 1

## 2021-07-07 MED ORDER — DEXAMETHASONE SODIUM PHOSPHATE 10 MG/ML IJ SOLN
INTRAMUSCULAR | Status: AC
  Filled 2021-07-07: qty 1

## 2021-07-07 MED ORDER — BACITRACIN-NEOMYCIN-POLYMYXIN 400-5-5000 EX OINT: 1.0000 "application " | TOPICAL_OINTMENT | Freq: Three times a day (TID) | CUTANEOUS | Status: DC | PRN

## 2021-07-07 MED ORDER — ACETAMINOPHEN 10 MG/ML IV SOLN
1000.0000 mg | Freq: Four times a day (QID) | INTRAVENOUS | Status: AC
  Administered 2021-07-07 – 2021-07-08 (×4): 1000 mg via INTRAVENOUS
  Filled 2021-07-07 (×3): qty 100

## 2021-07-07 MED ORDER — MIDAZOLAM HCL 5 MG/5ML IJ SOLN
INTRAMUSCULAR | Status: DC | PRN
  Administered 2021-07-07: 2 mg via INTRAVENOUS

## 2021-07-07 MED ORDER — OXYCODONE HCL 5 MG PO TABS
ORAL_TABLET | ORAL | Status: AC
  Filled 2021-07-07: qty 1

## 2021-07-07 MED ORDER — FENTANYL CITRATE PF 50 MCG/ML IJ SOSY
PREFILLED_SYRINGE | INTRAMUSCULAR | Status: AC
  Administered 2021-07-07: 50 ug via INTRAVENOUS
  Filled 2021-07-07: qty 3

## 2021-07-07 MED ORDER — ONDANSETRON HCL 4 MG/2ML IJ SOLN: 4.0000 mg | INTRAMUSCULAR | Status: DC | PRN

## 2021-07-07 MED ORDER — INSULIN ASPART 100 UNIT/ML IJ SOLN: 0.0000 [IU] | Freq: Three times a day (TID) | INTRAMUSCULAR | Status: DC

## 2021-07-07 MED ORDER — DEXMEDETOMIDINE (PRECEDEX) IN NS 20 MCG/5ML (4 MCG/ML) IV SYRINGE
PREFILLED_SYRINGE | INTRAVENOUS | Status: DC | PRN
  Administered 2021-07-07 (×2): 8 ug via INTRAVENOUS
  Administered 2021-07-07: 4 ug via INTRAVENOUS

## 2021-07-07 MED ORDER — TAMSULOSIN HCL 0.4 MG PO CAPS
0.4000 mg | ORAL_CAPSULE | Freq: Every day | ORAL | Status: DC
  Administered 2021-07-07: 0.4 mg via ORAL
  Filled 2021-07-07: qty 1

## 2021-07-07 MED ORDER — LIDOCAINE HCL (PF) 2 % IJ SOLN
INTRAMUSCULAR | Status: AC
  Filled 2021-07-07: qty 5

## 2021-07-07 MED ORDER — OXYCODONE HCL 5 MG/5ML PO SOLN: 5.0000 mg | Freq: Once | ORAL | Status: AC | PRN

## 2021-07-07 MED ORDER — ONDANSETRON HCL 4 MG/2ML IJ SOLN
INTRAMUSCULAR | Status: AC
  Filled 2021-07-07: qty 2

## 2021-07-07 MED ORDER — OXYCODONE HCL 5 MG PO TABS
10.0000 mg | ORAL_TABLET | ORAL | Status: DC | PRN
Start: 2021-07-07 — End: 2021-07-08
  Administered 2021-07-07 (×2): 10 mg via ORAL
  Filled 2021-07-07 (×2): qty 2

## 2021-07-07 MED ORDER — VANCOMYCIN HCL 1 G IV SOLR
Freq: Once | INTRAVENOUS | Status: AC
  Administered 2021-07-07: 1000 mL
  Filled 2021-07-07: qty 20

## 2021-07-07 MED ORDER — PROPOFOL 10 MG/ML IV BOLUS
INTRAVENOUS | Status: DC | PRN
  Administered 2021-07-07: 200 mg via INTRAVENOUS
  Administered 2021-07-07 (×2): 50 mg via INTRAVENOUS

## 2021-07-07 MED ORDER — VANCOMYCIN HCL IN DEXTROSE 1-5 GM/200ML-% IV SOLN
1000.0000 mg | INTRAVENOUS | Status: AC
  Administered 2021-07-07: 1000 mg via INTRAVENOUS
  Filled 2021-07-07: qty 200

## 2021-07-07 MED ORDER — AMLODIPINE BESYLATE 5 MG PO TABS
5.0000 mg | ORAL_TABLET | Freq: Every day | ORAL | Status: DC
  Administered 2021-07-08: 5 mg via ORAL
  Filled 2021-07-07: qty 1

## 2021-07-07 MED ORDER — OXYCODONE HCL 5 MG PO TABS
5.0000 mg | ORAL_TABLET | ORAL | Status: DC | PRN
Start: 2021-07-07 — End: 2021-07-08
  Administered 2021-07-08: 5 mg via ORAL
  Filled 2021-07-07: qty 1

## 2021-07-07 MED ORDER — VANCOMYCIN HCL 1000 MG/200ML IV SOLN
1000.0000 mg | Freq: Two times a day (BID) | INTRAVENOUS | Status: AC
  Administered 2021-07-07: 1000 mg via INTRAVENOUS
  Filled 2021-07-07: qty 200

## 2021-07-07 MED ORDER — ACETAMINOPHEN 10 MG/ML IV SOLN
INTRAVENOUS | Status: AC
  Filled 2021-07-07: qty 100

## 2021-07-07 MED ORDER — PHENYLEPHRINE HCL-NACL 20-0.9 MG/250ML-% IV SOLN
INTRAVENOUS | Status: DC | PRN
  Administered 2021-07-07: 35 ug/min via INTRAVENOUS

## 2021-07-07 MED ORDER — ONDANSETRON HCL 4 MG/2ML IJ SOLN: 4.0000 mg | Freq: Once | INTRAMUSCULAR | Status: DC | PRN

## 2021-07-07 MED ORDER — OXYCODONE HCL 5 MG PO TABS
5.0000 mg | ORAL_TABLET | Freq: Once | ORAL | Status: AC | PRN
  Administered 2021-07-07: 5 mg via ORAL

## 2021-07-07 MED ORDER — ORAL CARE MOUTH RINSE: 15.0000 mL | Freq: Once | OROMUCOSAL | Status: AC

## 2021-07-07 MED ORDER — ACETAMINOPHEN 500 MG PO TABS
1000.0000 mg | ORAL_TABLET | Freq: Once | ORAL | Status: AC
  Administered 2021-07-07: 1000 mg via ORAL
  Filled 2021-07-07: qty 2

## 2021-07-07 MED ORDER — PROPOFOL 10 MG/ML IV BOLUS
INTRAVENOUS | Status: AC
  Filled 2021-07-07: qty 20

## 2021-07-07 MED ORDER — DEXTROSE 5 % IV SOLN
5.0000 mg/kg | Freq: Once | INTRAVENOUS | Status: DC
  Filled 2021-07-07: qty 9.75

## 2021-07-07 MED ORDER — SODIUM CHLORIDE 0.9 % IR SOLN
Status: DC | PRN
  Administered 2021-07-07: 1000 mL

## 2021-07-07 MED ORDER — GENTAMICIN SULFATE 40 MG/ML IJ SOLN
5.0000 mg/kg | INTRAVENOUS | Status: AC
  Administered 2021-07-07: 390 mg via INTRAVENOUS
  Filled 2021-07-07: qty 9.75

## 2021-07-07 MED ORDER — DEXAMETHASONE SODIUM PHOSPHATE 10 MG/ML IJ SOLN
INTRAMUSCULAR | Status: DC | PRN
  Administered 2021-07-07: 10 mg via INTRAVENOUS

## 2021-07-07 MED ORDER — PHENYLEPHRINE HCL (PRESSORS) 10 MG/ML IV SOLN
INTRAVENOUS | Status: DC | PRN
  Administered 2021-07-07 (×3): 80 ug via INTRAVENOUS

## 2021-07-07 MED ORDER — DOCUSATE SODIUM 100 MG PO CAPS
100.0000 mg | ORAL_CAPSULE | Freq: Two times a day (BID) | ORAL | Status: DC
  Administered 2021-07-07 – 2021-07-08 (×2): 100 mg via ORAL
  Filled 2021-07-07 (×2): qty 1

## 2021-07-07 MED ORDER — STERILE WATER FOR IRRIGATION IR SOLN
Status: DC | PRN
  Administered 2021-07-07: 500 mL

## 2021-07-07 MED ORDER — FENTANYL CITRATE PF 50 MCG/ML IJ SOSY
25.0000 ug | PREFILLED_SYRINGE | INTRAMUSCULAR | Status: DC | PRN
  Administered 2021-07-07 (×2): 50 ug via INTRAVENOUS

## 2021-07-07 MED ORDER — MIDAZOLAM HCL 2 MG/2ML IJ SOLN
INTRAMUSCULAR | Status: AC
  Filled 2021-07-07: qty 2

## 2021-07-07 MED ORDER — BUPIVACAINE HCL (PF) 0.25 % IJ SOLN
INTRAMUSCULAR | Status: DC | PRN
  Administered 2021-07-07: 10 mL

## 2021-07-07 MED ORDER — DEXMEDETOMIDINE (PRECEDEX) IN NS 20 MCG/5ML (4 MCG/ML) IV SYRINGE
PREFILLED_SYRINGE | INTRAVENOUS | Status: AC
  Filled 2021-07-07: qty 5

## 2021-07-07 MED ORDER — FENTANYL CITRATE (PF) 100 MCG/2ML IJ SOLN
INTRAMUSCULAR | Status: DC | PRN
  Administered 2021-07-07 (×2): 50 ug via INTRAVENOUS
  Administered 2021-07-07: 100 ug via INTRAVENOUS

## 2021-07-07 SURGICAL SUPPLY — 63 items
ADH SKN CLS APL DERMABOND .7 (GAUZE/BANDAGES/DRESSINGS) ×1
BAG COUNTER SPONGE SURGICOUNT (BAG) IMPLANT
BAG DECANTER FOR FLEXI CONT (MISCELLANEOUS) ×1 IMPLANT
BAG SPNG CNTER NS LX DISP (BAG)
BLADE CLIPPER SURG (BLADE) ×1 IMPLANT
BLADE HEX COATED 2.75 (ELECTRODE) ×1 IMPLANT
BNDG COHESIVE 1X5 TAN STRL LF (GAUZE/BANDAGES/DRESSINGS) ×1 IMPLANT
BNDG CONFORM 2 STRL LF (GAUZE/BANDAGES/DRESSINGS) ×1 IMPLANT
BNDG GAUZE ELAST 4 BULKY (GAUZE/BANDAGES/DRESSINGS) ×2 IMPLANT
BRUSH SCRUB EZ 1% IODOPHOR (MISCELLANEOUS) ×1 IMPLANT
CATH FOLEY 2WAY SLVR  5CC 16FR (CATHETERS) ×2
CATH FOLEY 2WAY SLVR 5CC 16FR (CATHETERS) IMPLANT
COVER SURGICAL LIGHT HANDLE (MISCELLANEOUS) ×2 IMPLANT
DERMABOND ADVANCED (GAUZE/BANDAGES/DRESSINGS) ×1
DERMABOND ADVANCED .7 DNX12 (GAUZE/BANDAGES/DRESSINGS) ×1 IMPLANT
DRAIN PENROSE 0.25X18 (DRAIN) ×1 IMPLANT
DRAPE LAPAROTOMY T 98X78 PEDS (DRAPES) ×2 IMPLANT
ELECT REM PT RETURN 15FT ADLT (MISCELLANEOUS) ×2 IMPLANT
GAUZE 4X4 16PLY ~~LOC~~+RFID DBL (SPONGE) ×1 IMPLANT
GAUZE PETROLATUM 1 X8 (GAUZE/BANDAGES/DRESSINGS) ×1 IMPLANT
GLOVE SRG 8 PF TXTR STRL LF DI (GLOVE) IMPLANT
GLOVE SURG ENC MOIS LTX SZ8 (GLOVE) ×1 IMPLANT
GLOVE SURG ENC TEXT LTX SZ8 (GLOVE) ×2 IMPLANT
GLOVE SURG POLYISO LF SZ7.5 (GLOVE) ×1 IMPLANT
GLOVE SURG UNDER POLY LF SZ7 (GLOVE) ×1 IMPLANT
GLOVE SURG UNDER POLY LF SZ7.5 (GLOVE) ×1 IMPLANT
GLOVE SURG UNDER POLY LF SZ8 (GLOVE) ×4
GOWN STRL REUS W/ TWL XL LVL3 (GOWN DISPOSABLE) ×1 IMPLANT
GOWN STRL REUS W/TWL XL LVL3 (GOWN DISPOSABLE) ×6 IMPLANT
IV NS 1000ML (IV SOLUTION) ×2
IV NS 1000ML BAXH (IV SOLUTION) IMPLANT
KIT BASIN OR (CUSTOM PROCEDURE TRAY) ×2 IMPLANT
KIT TITAN ASSEMBLY (Erectile Restoration) ×2 IMPLANT
KIT TITAN ASSEMBLY STANDARD (Erectile Restoration) ×1 IMPLANT
KIT TITAN ASSEMBLY STD (Erectile Restoration) IMPLANT
KIT TURNOVER KIT A (KITS) ×1 IMPLANT
NEEDLE HYPO 22GX1.5 SAFETY (NEEDLE) IMPLANT
NS IRRIG 1000ML POUR BTL (IV SOLUTION) ×2 IMPLANT
PACK GENERAL/GYN (CUSTOM PROCEDURE TRAY) ×2 IMPLANT
PROS TITAN INFRA 0 ANG 20CM (Erectile Restoration) ×2 IMPLANT
PROSTHESIS TTN INFR 0 ANG 20CM (Erectile Restoration) IMPLANT
RESERVOIR 75CC LOCKOUT BIOFLEX (Erectile Restoration) ×1 IMPLANT
SOL PREP POV-IOD 4OZ 10% (MISCELLANEOUS) ×1 IMPLANT
SPONGE T-LAP 4X18 ~~LOC~~+RFID (SPONGE) ×3 IMPLANT
SUPPORT SCROTAL LG STRP (MISCELLANEOUS) ×2 IMPLANT
SUT CHROMIC 3 0 SH 27 (SUTURE) ×1 IMPLANT
SUT CHROMIC 4 0 SH 27 (SUTURE) IMPLANT
SUT MNCRL AB 4-0 PS2 18 (SUTURE) ×1 IMPLANT
SUT PROLENE 4 0 RB 1 (SUTURE)
SUT PROLENE 4-0 RB1 .5 CRCL 36 (SUTURE) ×1 IMPLANT
SUT VIC AB 2-0 UR5 27 (SUTURE) ×7 IMPLANT
SUT VIC AB 3-0 SH 27 (SUTURE) ×2
SUT VIC AB 3-0 SH 27X BRD (SUTURE) IMPLANT
SUT VIC AB 4-0 PS2 27 (SUTURE) ×2 IMPLANT
SUT VICRYL 0 TIES 12 18 (SUTURE) ×1 IMPLANT
SYR 10ML LL (SYRINGE) ×3 IMPLANT
SYR 50ML LL SCALE MARK (SYRINGE) ×2 IMPLANT
SYR CONTROL 10ML LL (SYRINGE) IMPLANT
SYR TOOMEY IRRIG 70ML (MISCELLANEOUS) ×2
SYRINGE TOOMEY IRRIG 70ML (MISCELLANEOUS) IMPLANT
TOWEL OR 17X26 10 PK STRL BLUE (TOWEL DISPOSABLE) ×2 IMPLANT
WATER STERILE IRR 1000ML POUR (IV SOLUTION) ×2 IMPLANT
WATER STERILE IRR 500ML POUR (IV SOLUTION) ×1 IMPLANT

## 2021-07-07 NOTE — H&P (Signed)
H&P  Chief Complaint: Malfunctioning penile prosthesis  History of Present Illness: 51 year old male presents at this time for management of a malfunctioning penile prosthesis.  He underwent implant in April, 2022.  It was working well until approximately 2 months ago.  He presented to the office for evaluation.  The implant was not pumped appropriately-pump would not refill.  There is an obvious leak or other malfunction he presents at this time for exploration, repair versus replacement.  The patient is aware of attendant risks and complications including but not limited to repeat malfunction/replacement, infection, anesthetic complications among others.  He understands and desires to proceed.  Past Medical History:  Diagnosis Date   BPH (benign prostatic hyperplasia)    Carotid artery disease (HCC)    [place] 5/22: Bilat 1-39    Complication of anesthesia    Pt verble and "prophecizes" after anesthesia   Coronary Artery Disease cardiologist--- dr cooper   Coronary CTA 09/2018: pLAD 70-90; pD1+pD2 70-90; mRCA mixed plaque - cannot rule out 70-90; FFR suggests hemodynamically significant stenosis // LHC 10/2018: 2v CAD >> s/p  CABG (L-LAD, L radial-RI, S-D2, S-RCA) with Dr. 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   LIMA--LAD;  SVG--D2;  SVG--dRCA;  left radial --- RI   Type 2 diabetes mellitus (HCC)    followed by pcp   (10-01-2020 checks blood sugar 3 times wkly,  fasting sugar-- 72--105)   Wears glasses     Past Surgical History:  Procedure Laterality Date   CORONARY ARTERY BYPASS GRAFT N/A 10/17/2018   Procedure: CORONARY ARTERY BYPASS GRAFTING (CABG), ON PUMP, TIMES FOUR, USING LIMA TO LAD, ENDOSCOPICALLY HARVESTED GREATER SAPHENOUS VEIN TO DIAG 2, SVG TO DISTAL RCA, AND LEFT RADIAL TO RAMUS INTERMEDIUS;  Surgeon: [name],  MD;  Location: [facility];  Service: Open Heart Surgery;  Laterality: N/A;   ENDOVEIN HARVEST OF GREATER SAPHENOUS VEIN Right 10/17/2018   Procedure: Mack Guise Of Greater Saphenous Vein;  Surgeon: [name], MD;  Location: [facility] OR;  Service: Open Heart Surgery;  Laterality: Right;   HAND SURGERY Left 1987   complex laceration   INGUINAL HERNIA REPAIR Bilateral 05/27/2015   Procedure: LAPAROSCOPIC BILATERAL  INGUINAL HERNIA REPAIRS WITH MESH;  Surgeon: Karie Soda, MD;  Location: [facility];  Service: General;  Laterality: Bilateral;   KNEE ARTHROSCOPY Left 1991   LEFT HEART CATH AND CORONARY ANGIOGRAPHY N/A 10/14/2018   Procedure: LEFT HEART CATH AND CORONARY ANGIOGRAPHY;  Surgeon: Lyn Records, MD;  [facility];  Service: Cardiovascular;  Laterality: N/A;   PENILE PROSTHESIS IMPLANT N/A 10/03/2020   Procedure: PLACEMENT OF PENILE PROTHESIS INFLATABLE;  Surgeon: Marcine Matar, MD;  Location: [facility];  Service: Urology;  Laterality: N/A;  pubis area   RADIAL ARTERY HARVEST Left 10/17/2018   Procedure: RADIAL ARTERY HARVEST;  Surgeon: [name], MD;  Location[facility] OR;  Service: Open Heart Surgery;  Laterality: Left;   SHOULDER CLOSED REDUCTION Left 09/04/2019   Procedure: CLOSED MANIPULATION SHOULDER;  Surgeon: Dannielle Huh, MD;  Location: [facility];  Service: Orthopedics;  Laterality: Left;   SHOULDER SURGERY Left 2022   TEE WITHOUT CARDIOVERSION N/A 10/17/2018   Procedure: TRANSESOPHAGEAL ECHOCARDIOGRAM (TEE);  Surgeon: [name],  MD;  Location: [facility];  Service: Open Heart Surgery;  Laterality: N/A;    Home Medications:    Allergies:  Allergies  Allergen Reactions   Antihistamines, Diphenhydramine-Type Hives and Other (See Comments)    Seizures   Demerol Hives   Morphine And Related Hives    Esp with high dose morphine, possibly Dilaudid Tolerates hydrocodone, oxycodone   Compazine [Prochlorperazine] Other (See Comments)     Spastic movement   Metoprolol     Per patient caused "severe headache"   Statins Other (See Comments)    myalgias    Family History  Problem Relation Age of Onset   Hypertension Mother    Cancer Father    Heart failure Father    Sudden death Brother    Heart attack Brother    Hypertension Sister    Heart murmur Sister     Social History:  reports that he has never smoked. He has never used smokeless tobacco. He reports that he does not drink alcohol and does not use drugs.  ROS: A complete review of systems was performed.  All systems are negative except for pertinent findings as noted.  Physical Exam:  Vital signs in last 24 hours: BP (!) 154/101    Pulse 90    Temp 98.1 F (36.7 C) (Oral)    Resp 15    SpO2 100%  Constitutional:  Alert and oriented, No acute distress Cardiovascular: Regular rate  Respiratory: Normal respiratory effort GI: Abdomen is soft, nontender, nondistended, no abdominal masses. No CVAT.  Genitourinary: Normal male phallus, testes are descended bilaterally and non-tender and without masses, scrotum is normal in appearance without lesions or masses, perineum is normal on inspection. Lymphatic: No lymphadenopathy Neurologic: Grossly intact, no focal deficits Psychiatric: Normal mood and affect  I have reviewed prior pt notes  I have reviewed notes from referring/previous physicians  I have reviewed urinalysis results  I have independently reviewed prior imaging  I have reviewed prior PSA results  I have reviewed prior urine culture   Impression/Assessment:  Malfunctioning penile prosthesis  Plan:  Exploration of penile prosthesis, repair/replacement

## 2021-07-07 NOTE — Plan of Care (Signed)
°  Problem: Education: Goal: Knowledge of General Education information will improve Description: Including pain rating scale, medication(s)/side effects and non-pharmacologic comfort measures Outcome: Progressing   Problem: Clinical Measurements: Goal: Will remain free from infection Outcome: Progressing Goal: Diagnostic test results will improve Outcome: Progressing Goal: Respiratory complications will improve Outcome: Progressing Goal: Cardiovascular complication will be avoided Outcome: Progressing   Problem: Activity: Goal: Risk for activity intolerance will decrease Outcome: Progressing   Problem: Nutrition: Goal: Adequate nutrition will be maintained Outcome: Progressing   Problem: Elimination: Goal: Will not experience complications related to bowel motility Outcome: Progressing   Problem: Pain Managment: Goal: General experience of comfort will improve Outcome: Progressing   Problem: Safety: Goal: Ability to remain free from injury will improve Outcome: Progressing   Problem: Skin Integrity: Goal: Risk for impaired skin integrity will decrease Outcome: Progressing

## 2021-07-07 NOTE — Transfer of Care (Signed)
Immediate Anesthesia Transfer of Care Note  Patient: Danny Brewer  Procedure(s) Performed: Kathee Delton  AND REPLACEMENT OF COLOPLAST TITAN PENILE PROSTHESIS (Penis) EXPLORATION OF PENILE PROSTHESIS (Penis)  Patient Location: PACU  Anesthesia Type:General  Level of Consciousness: awake, alert  and oriented  Airway & Oxygen Therapy: Patient Spontanous Breathing and Patient connected to face mask oxygen  Post-op Assessment: Report given to RN and Post -op Vital signs reviewed and stable  Post vital signs: Reviewed and stable  Last Vitals:  Vitals Value Taken Time  BP 117/81 07/07/21 1222  Temp    Pulse 79 07/07/21 1223  Resp 10 07/07/21 1223  SpO2 100 % 07/07/21 1223  Vitals shown include unvalidated device data.  Last Pain:  Vitals:   07/07/21 0718  TempSrc:   PainSc: 0-No pain         Complications: No notable events documented.

## 2021-07-07 NOTE — Interval H&P Note (Signed)
History and Physical Interval Note:  07/07/2021 9:09 AM  Danny Brewer  has presented today for surgery, with the diagnosis of MALFUNCTIONING PENILE PROSTHESIS.  The various methods of treatment have been discussed with the patient and family. After consideration of risks, benefits and other options for treatment, the patient has consented to  Procedure(s): REMOVAL AND REPLACEMENT OF COLOPLAST TITAN PENILE PROSTHESIS (N/A) EXPLORATION OF PENILE PROSTHESIS (N/A) as a surgical intervention.  The patient's history has been reviewed, patient examined, no change in status, stable for surgery.  I have reviewed the patient's chart and labs.  Questions were answered to the patient's satisfaction.     Bertram Millard Olga Bourbeau

## 2021-07-07 NOTE — Brief Op Note (Signed)
07/07/2021  12:02 PM  PATIENT:  Danny Brewer  51 y.o. male  PRE-OPERATIVE DIAGNOSIS:  MALFUNCTIONING PENILE PROSTHESIS  POST-OPERATIVE DIAGNOSIS:  MALFUNCTIONING PENILE PROSTHESIS  PROCEDURE:  Procedure(s): EXPLANT  AND REPLACEMENT OF COLOPLAST TITAN PENILE PROSTHESIS (N/A) EXPLORATION OF PENILE PROSTHESIS (N/A)  SURGEON:  Surgeon(s) and Role:    * Franchot Gallo, MD - Primary  ASSISTANTS: Dr Danise Edge  ANESTHESIA:   General  EBL:  150 mL   BLOOD ADMINISTERED:None  DRAINS: None   LOCAL MEDICATIONS USED:  Marcaine  SPECIMEN:  N/A  COUNTS:  Correct  TOURNIQUET:  * No tourniquets in log *   PATIENT DISPOSITION:  To PACU   Delay start of Pharmacological VTE agent (>24hrs) due to surgical blood loss or risk of bleeding: N/A

## 2021-07-07 NOTE — Progress Notes (Signed)
Post-op note  Subjective: The patient is doing well.  Catheter bothering him. Urine clear.  Objective: Vital signs in last 24 hours: Temp:  [97.4 F (36.3 C)-98.7 F (37.1 C)] 98.4 F (36.9 C) (01/30 2039) Pulse Rate:  [72-98] 72 (01/30 2039) Resp:  [7-24] 20 (01/30 2039) BP: (104-154)/(73-101) 122/83 (01/30 2039) SpO2:  [93 %-100 %] 96 % (01/30 2039) Weight:  [88.7 kg] 88.7 kg (01/30 1601)  Intake/Output from previous day: No intake/output data recorded. Intake/Output this shift: No intake/output data recorded.  Physical Exam:  General: Alert and oriented. Abdomen: Soft, Nondistended. Incisions: Clean and dry.  Lab Results: Recent Labs    07/07/21 1252  HGB 13.5  HCT 41.9    Assessment/Plan: POD#0  Seems to be doing well.  1) Continue to monitor 2) Discussed postop course w/ pt   Danny Brewer. Danny Matera, MD   LOS: 0 days   Danny Brewer Shellene Sweigert 07/07/2021, 10:23 PM

## 2021-07-07 NOTE — Anesthesia Procedure Notes (Addendum)
Date/Time: 07/07/2021 9:37 AM Performed by: Orest Dikes, CRNA Pre-anesthesia Checklist: Patient identified, Emergency Drugs available, Suction available and Patient being monitored Patient Re-evaluated:Patient Re-evaluated prior to induction Induction Type: IV induction LMA: LMA with gastric port inserted LMA Size: 4.0 Number of attempts: 1 Placement Confirmation: positive ETCO2 and breath sounds checked- equal and bilateral Tube secured with: Tape Dental Injury: Teeth and Oropharynx as per pre-operative assessment

## 2021-07-07 NOTE — Progress Notes (Signed)
Pharmacy Antibiotic Note  Danny Brewer is a 51 y.o. male admitted on 07/07/2021 with malfunctioning penile prosthesis s/p repair.  Pharmacy has been consulted for gentamicin dosing for post op surgical prophylaxis.  Plan: Received gentamicin 5mg /kg preop at 09:30, will repeat tomorrow at 09:30 x 1 dose. No levels indicated at this time   Temp (24hrs), Avg:98.2 F (36.8 C), Min:97.4 F (36.3 C), Max:98.7 F (37.1 C)  Recent Labs  Lab 07/07/21 1252  WBC 9.1    CrCl cannot be calculated (Patient's most recent lab result is older than the maximum 21 days allowed.).    Allergies  Allergen Reactions   Antihistamines, Diphenhydramine-Type Hives and Other (See Comments)    Seizures   Demerol Hives   Morphine And Related Hives    Esp with high dose morphine, possibly Dilaudid Tolerates hydrocodone, oxycodone   Compazine [Prochlorperazine] Other (See Comments)    Spastic movement   Metoprolol     Per patient caused "severe headache"   Statins Other (See Comments)    myalgias    Antimicrobials this admission: 1/30 Vancomycin >> 1/31 1/30 Gentamicin >> 1/31  Dose adjustments this admission:  Microbiology results: None  Thank you for allowing pharmacy to be a part of this patients care.  Peggyann Juba, PharmD, BCPS Pharmacy: [phone] 07/07/2021 4:35 PM

## 2021-07-07 NOTE — Discharge Instructions (Addendum)
DO not use the pump until cleared by Dr. Diona Fanti.  Please complete the antibiotic prescription that have been provided for you.    Activity:  You are encouraged to ambulate frequently (about every hour during waking hours) to help prevent blood clots from forming in your legs or lungs.  However, you should not engage in any heavy lifting (> 10-15 lbs), strenuous activity, or straining. Diet: You should advance your diet as instructed by your physician.  It will be normal to have some bloating, nausea, and abdominal discomfort intermittently. Prescriptions:  You will be provided a prescription for pain medication to take as needed.  If your pain is not severe enough to require the prescription pain medication, you may take extra strength Tylenol instead which will have less side effects.  You should also take a prescribed stool softener to avoid straining with bowel movements as the prescription pain medication may constipate you. Incisions: You may remove your dressing bandages 48 hours after surgery if not removed in the hospital.  You will either have some small staples or special tissue glue at each of the incision sites. Once the bandages are removed (if present), the incisions may stay open to air.  You may start showering (but not soaking or bathing in water) the 2nd day after surgery and the incisions simply need to be patted dry after the shower.  No additional care is needed. What to call us about: You should call the office [phone]) if you develop fever > 101 or develop persistent vomiting.

## 2021-07-08 ENCOUNTER — Encounter (HOSPITAL_COMMUNITY): Payer: Self-pay | Admitting: Urology

## 2021-07-08 DIAGNOSIS — T83490A Other mechanical complication of penile (implanted) prosthesis, initial encounter: Secondary | ICD-10-CM | POA: Diagnosis not present

## 2021-07-08 LAB — CBC
HCT: 38.5 % — ABNORMAL LOW (ref 39.0–52.0)
Hemoglobin: 12.3 g/dL — ABNORMAL LOW (ref 13.0–17.0)
MCH: 28.4 pg (ref 26.0–34.0)
MCHC: 31.9 g/dL (ref 30.0–36.0)
MCV: 88.9 fL (ref 80.0–100.0)
Platelets: 211 10*3/uL (ref 150–400)
RBC: 4.33 MIL/uL (ref 4.22–5.81)
RDW: 13 % (ref 11.5–15.5)
WBC: 12.1 10*3/uL — ABNORMAL HIGH (ref 4.0–10.5)
nRBC: 0 % (ref 0.0–0.2)

## 2021-07-08 LAB — GLUCOSE, CAPILLARY
Glucose-Capillary: 120 mg/dL — ABNORMAL HIGH (ref 70–99)
Glucose-Capillary: 107 mg/dL — ABNORMAL HIGH (ref 70–99)

## 2021-07-08 MED ORDER — FLUCONAZOLE 100 MG PO TABS: 100.0000 mg | ORAL_TABLET | Freq: Every day | ORAL | 0 refills | Status: DC

## 2021-07-08 MED ORDER — KETOROLAC TROMETHAMINE 15 MG/ML IJ SOLN
15.0000 mg | Freq: Once | INTRAMUSCULAR | Status: AC
  Administered 2021-07-08: 15 mg via INTRAVENOUS
  Filled 2021-07-08: qty 1

## 2021-07-08 MED ORDER — SULFAMETHOXAZOLE-TRIMETHOPRIM 800-160 MG PO TABS: 1.0000 | ORAL_TABLET | Freq: Two times a day (BID) | ORAL | 0 refills | Status: DC

## 2021-07-08 MED ORDER — OXYCODONE HCL 5 MG PO TABS: 5.0000 mg | ORAL_TABLET | ORAL | 0 refills | Status: DC | PRN

## 2021-07-08 MED ORDER — ACETAMINOPHEN 500 MG PO TABS: 1000.0000 mg | ORAL_TABLET | Freq: Four times a day (QID) | ORAL | 0 refills | Status: DC

## 2021-07-08 NOTE — Plan of Care (Signed)

## 2021-07-08 NOTE — Anesthesia Postprocedure Evaluation (Signed)
Anesthesia Post Note  Patient: Danny Brewer  Procedure(s) Performed: Kathee Delton  AND REPLACEMENT OF COLOPLAST TITAN PENILE PROSTHESIS (Penis) EXPLORATION OF PENILE PROSTHESIS (Penis)     Patient location during evaluation: PACU Anesthesia Type: General Level of consciousness: awake and alert, oriented and patient cooperative Pain management: pain level controlled Vital Signs Assessment: post-procedure vital signs reviewed and stable Respiratory status: spontaneous breathing, nonlabored ventilation and respiratory function stable Cardiovascular status: blood pressure returned to baseline and stable Postop Assessment: no apparent nausea or vomiting Anesthetic complications: yes Comments: Emergence delirium, combative in PACU despite precedex intraoperatively    No notable events documented.  Last Vitals:  Vitals:   07/07/21 2347 07/08/21 0449  BP: (!) 134/91 115/79  Pulse: 63 86  Resp: 20 19  Temp: 36.7 C 36.7 C  SpO2: 98% 98%    Last Pain:  Vitals:   07/08/21 0449  TempSrc: Oral  PainSc:                  [name]

## 2021-07-08 NOTE — Discharge Summary (Signed)
[facility] Discharge Summary  Admit date: 07/07/2021  Discharge date and time: 07/08/21   Discharge to: Home  Discharge Service: Urology  Discharge Attending Physician:  Marcine Matar, MD  Discharge  Diagnoses: Erectile dysfunction  Secondary Diagnosis: Principal Problem:   Erectile dysfunction   OR Procedures: Procedure(s): EXPLANT  AND REPLACEMENT OF COLOPLAST TITAN PENILE PROSTHESIS EXPLORATION OF PENILE PROSTHESIS 07/07/2021   Ancillary Procedures: None   Discharge Day Services: The patient was seen and examined by the Urology team both in the morning and immediately prior to discharge.  Vital signs and laboratory values were stable and within normal limits.  The physical exam was benign and unchanged and all surgical wounds were examined.  Discharge instructions were explained and all questions answered.  Subjective  No acute events overnight. Pain Controlled. No fever or chills.  Objective No data found.  No intake/output data recorded.  General Appearance:        No acute distress Lungs:                       Normal work of breathing on room air Heart:                                Regular rate and rhythm Abdomen:                         Soft, non-tender, non-distended GU:    Midline infrapubic incision C/D/I. Penis dressed with mnimal saturation. Appropriate tenderness. Cylinders and pump in appropriate location. Testes descended.  Extremities:                      Warm and well perfused   Hospital Course:  The patient underwent Replacement of IPP with Dr. Retta Diones on 07/07/2021.  The patient tolerated the procedure well, was extubated in the OR, and afterwards was taken to the PACU for routine post-surgical care. When stable the patient was transferred to the floor.   The patient did well postoperatively.  The patients diet was slowly advanced and at the time of discharge was tolerating a regular diet.  The patient was discharged home 1 Day Post-Op, at  which point was tolerating a regular solid diet, was able to void spontaneously, have adequate pain control with P.O. pain medication, and could ambulate without difficulty. The patient will follow up with [place] for post op check.   Condition at Discharge: Improved  Discharge Medications:  Allergies as of 07/08/2021       Reactions   Antihistamines, Diphenhydramine-type Hives, Other (See Comments)   Seizures   Demerol Hives   Morphine And Related Hives   Esp with high dose morphine, possibly Dilaudid Tolerates hydrocodone, oxycodone   Compazine [prochlorperazine] Other (See Comments)   Spastic movement   Metoprolol    Per patient caused "severe headache"   Statins Other (See Comments)   myalgias        Medication List     TAKE these medications    acetaminophen 500 MG tablet Commonly known as: TYLENOL Take 2 tablets (1,000 mg total) by mouth every 6 (six) hours. What changed:  when to take this reasons to take this   amLODipine 5 MG tablet Commonly known as: NORVASC Take 1 tablet (5 mg total) by mouth daily.   aspirin 81 MG tablet Take 1 tablet (81 mg total) by mouth daily.  baclofen 10 MG tablet Commonly known as: LIORESAL Take 1 tablet (10 mg total) by mouth 3 (three) times daily. What changed:  when to take this reasons to take this   ezetimibe 10 MG tablet Commonly known as: ZETIA Take 1 tablet (10 mg total) by mouth daily.   fluconazole 100 MG tablet Commonly known as: Diflucan Take 1 tablet (100 mg total) by mouth daily.   omeprazole 20 MG capsule Commonly known as: PRILOSEC Take 1 capsule (20 mg total) by mouth daily.   oxyCODONE 5 MG immediate release tablet Commonly known as: Oxy IR/ROXICODONE Take 1 tablet (5 mg total) by mouth every 4 (four) hours as needed for moderate pain or severe pain.   Praluent 75 MG/ML Soaj Generic drug: Alirocumab INJECT 1 ML INTO THE SKIN EVERY 14 (FOURTEEN) DAYS.   sitaGLIPtin 100 MG tablet Commonly known as:  JANUVIA Take 100 mg by mouth daily.   sulfamethoxazole-trimethoprim 800-160 MG tablet Commonly known as: BACTRIM DS Take 1 tablet by mouth 2 (two) times daily.   tamsulosin 0.4 MG Caps capsule Commonly known as: FLOMAX Take 0.4 mg by mouth at bedtime.

## 2021-07-10 NOTE — Op Note (Addendum)
Mar[facility] KitchenPATIENT:  Danny Brewer  PRE-OPERATIVE DIAGNOSIS:  Organic erectile dysfunction, malfunctioning coloplast Titan prosthesis  POST-OPERATIVE DIAGNOSIS:  Same  PROCEDURE:  Procedure(s): Removal/ replacement of 3 piece inflatable penile prosthesis Surveyor, minerals)  SURGEON:  Lillette Boxer. Adhira Jamil, MD  ASST: Carollee Sires, MD  EBL; 150 mL  Specimen: explanted prosthesis, to manufacturer  Device: 3 piece Titan: 75 cc reservoir, 20 cm cylinders a  Drains: none  LOCAL MEDICATIONS USED:  0.25 % plain marcaine  ANESTHESIA:  General  INDICATION: H 51 year old male who underwent placement of a Coloplast 3 piece inflatable penile prosthesis in April 2022.  Initially this worked fine.  Prior to his last office visit he was noted to have malfunction/inappropriate filling of the prosthesis.  He had been unsuccessful for several weeks before that inflating the prosthesis and having intercourse.  It was obvious that there was malfunction of the prosthesis at his visit.  He has requested removal and replacement or repair of the present prosthesis.  I discussed the procedure with the patient, the fact that quite likely we will need to totally explant the current prosthesis and replace it with another [employer].  He understands the procedure, the attendant risks and complications to go with this including repeat malfunction, bleeding, infection, pain from the procedure.  He understands and desires to proceed.  Description of procedure: The patient was taken to the major operating room, placed on the table and administered general anesthesia in the supine position. His genitalia was then scrubbed for 10 minutes, painted and then sterilely draped. An official timeout was then performed.  A Toomey syringe was utilized to irrigate the urethra with Betadine.  A 16 French Foley catheter was then placed in the bladder and the bladder was drained and the catheter was plugged.  The old surgical scar was excised and  then blunt and sharp dissection was used to expose the corpora cavern sharp dissection was used to dissect down to the reservoir along the reservoir tubing.  The rectus was divided in the midline, blunt and sharp dissection was used to open up the pouch where the reservoir was present.  The reservoir was then easily removed.  Dissection was then carried along the tubing to the pump in the right hemiscrotum.  The capsule was dissected in incised, the pump was then removed.  Both the pouches for the pump and reservoir were irrigated with antibiotic irrigation.  The dissection was then carried down to the corporal bodies bilaterally.  They were opened longitudinally and the cylinders were removed.  The proximal and distal measurements combined on each side measured 20 cm.  A significant amount of time was spent sounding each distal corporal body.  At some point, we realized that the cylinder shared a common cavity in the mid/distal penis.  It was difficult to access the right distal corporal body.  Because of this, right sided circumcising incision was made just proximal to the glanular ridge in the distal penis.  We then dissected into the corporal body on the right.  It was evident that there had not been proper placement of the right cylinder.  We then easily sounded the distal and proximal right corporal body.  At the very proximal penis, the corporotomies were quite close.  However, we were eventually able to, after correct placement of the replacement 20 cm cylinders bilaterally, 2 interpose tissue between these 2 areas, and closed the corporotomies with 2-0 Vicryl.  Prior to this, the reservoir had been placed in the previously placed  retropubic pouch following irrigation.  The fascia was closed with 2-0 Vicryl over top.  Additionally, a new subdartos pouch was carried out/placed in the right hemiscrotum.  Once adequately positioned, the reservoir was placed in the retropubic space and filled with 75 cc of  saline.  Even before closure of the corporotomies, erection was produced utilizing the pump and a saline filled syringe.  There was a excellent straight erection with adequate placement of the proximal cylinders bilaterally.  The pump tubing was connected to the reservoir tubing, proper connection was then made with the crimp device.  On 2 more occasions erection was produced utilizing this close system.  I irrigated the wound one last time with antibiotic irrigation and then closed the deep scrotal tissue over the tubing and pump with running 2-0 vicryl suture. A second layer was then closed over this first layer with running 2- 0 vicryl, and running subcutcular suture w/ 4-0 monocryl performed. Incision dressed with Dermabond.  Fluff dressing placed over scrotum/penis and supporter placed. The catheter was connected to closed system drainage and the patient was awakened and taken recovery room in stable and satisfactory condition. He tolerated the procedure well and there were no intraoperative complications. Needle sponge and instrument counts were correct at the end of the operation.

## 2021-07-11 ENCOUNTER — Emergency Department (HOSPITAL_BASED_OUTPATIENT_CLINIC_OR_DEPARTMENT_OTHER)
Admission: EM | Admit: 2021-07-11 | Discharge: 2021-07-11 | Disposition: A | Discharge status: Home / Self Care | Payer: BC Managed Care – PPO | Attending: Emergency Medicine | Admitting: Emergency Medicine

## 2021-07-11 ENCOUNTER — Encounter (HOSPITAL_BASED_OUTPATIENT_CLINIC_OR_DEPARTMENT_OTHER): Payer: Self-pay | Admitting: Emergency Medicine

## 2021-07-11 ENCOUNTER — Emergency Department (HOSPITAL_BASED_OUTPATIENT_CLINIC_OR_DEPARTMENT_OTHER): Payer: BC Managed Care – PPO | Admitting: Radiology

## 2021-07-11 ENCOUNTER — Other Ambulatory Visit: Payer: Self-pay

## 2021-07-11 DIAGNOSIS — Z7984 Long term (current) use of oral hypoglycemic drugs: Secondary | ICD-10-CM | POA: Insufficient documentation

## 2021-07-11 DIAGNOSIS — K209 Esophagitis, unspecified without bleeding: Secondary | ICD-10-CM | POA: Insufficient documentation

## 2021-07-11 DIAGNOSIS — Z951 Presence of aortocoronary bypass graft: Secondary | ICD-10-CM | POA: Diagnosis not present

## 2021-07-11 DIAGNOSIS — I2510 Atherosclerotic heart disease of native coronary artery without angina pectoris: Secondary | ICD-10-CM | POA: Diagnosis not present

## 2021-07-11 DIAGNOSIS — Z7982 Long term (current) use of aspirin: Secondary | ICD-10-CM | POA: Diagnosis not present

## 2021-07-11 DIAGNOSIS — Z79899 Other long term (current) drug therapy: Secondary | ICD-10-CM | POA: Diagnosis not present

## 2021-07-11 DIAGNOSIS — R079 Chest pain, unspecified: Secondary | ICD-10-CM | POA: Diagnosis present

## 2021-07-11 LAB — BASIC METABOLIC PANEL
Anion gap: 10 (ref 5–15)
BUN: 13 mg/dL (ref 6–20)
CO2: 24 mmol/L (ref 22–32)
Calcium: 9.2 mg/dL (ref 8.9–10.3)
Chloride: 105 mmol/L (ref 98–111)
Creatinine, Ser: 1.39 mg/dL — ABNORMAL HIGH (ref 0.61–1.24)
GFR, Estimated: >60 mL/min (ref >60)
Glucose, Bld: 98 mg/dL (ref 70–99)
Potassium: 4.3 mmol/L (ref 3.5–5.1)
Sodium: 139 mmol/L (ref 135–145)

## 2021-07-11 LAB — CBC
HCT: 42.9 % (ref 39.0–52.0)
Hemoglobin: 13.5 g/dL (ref 13.0–17.0)
MCH: 27.4 pg (ref 26.0–34.0)
MCHC: 31.5 g/dL (ref 30.0–36.0)
MCV: 87 fL (ref 80.0–100.0)
Platelets: 261 10*3/uL (ref 150–400)
RBC: 4.93 MIL/uL (ref 4.22–5.81)
RDW: 13.2 % (ref 11.5–15.5)
WBC: 7 10*3/uL (ref 4.0–10.5)
nRBC: 0 % (ref 0.0–0.2)

## 2021-07-11 LAB — TROPONIN I (HIGH SENSITIVITY): Troponin I (High Sensitivity): 5 ng/L (ref <18)

## 2021-07-11 MED ORDER — ALUM & MAG HYDROXIDE-SIMETH 200-200-20 MG/5ML PO SUSP
30.0000 mL | Freq: Once | ORAL | Status: AC
Start: 2021-07-11 — End: 2021-07-11
  Administered 2021-07-11: 30 mL via ORAL

## 2021-07-11 MED ORDER — FAMOTIDINE 20 MG PO TABS: 20.0000 mg | ORAL_TABLET | Freq: Two times a day (BID) | ORAL | 1 refills | Status: DC

## 2021-07-11 MED ORDER — LIDOCAINE VISCOUS HCL 2 % MT SOLN
15.0000 mL | Freq: Once | OROMUCOSAL | Status: AC
  Administered 2021-07-11: 15 mL via ORAL
  Filled 2021-07-11: qty 15

## 2021-07-11 MED ORDER — FAMOTIDINE IN NACL 20-0.9 MG/50ML-% IV SOLN
20.0000 mg | Freq: Once | INTRAVENOUS | Status: AC
  Administered 2021-07-11: 20 mg via INTRAVENOUS
  Filled 2021-07-11: qty 50

## 2021-07-11 NOTE — ED Provider Notes (Signed)
[facility] EMERGENCY DEPT Provider Note   CSN: [id number] Arrival date & time: 07/11/21  1031     History  Chief Complaint  Patient presents with   Chest Pain    Danny Brewer is a 51 y.o. male w/ hx of CAD s/p CABG x 4 in 2020, presenting to the emergency department with chest discomfort and dysphagia.  The patient says he underwent a penile implant surgery on 1/31; was discharged the next day with fluconazole and Bactrim, which he believes is a new antibiotic, as prophylaxis.  He has been taking his medications but he developed chest tightness and increasing shortness of breath and soreness in his throat over the past 24 hours.  He describes a sensation of discomfort, itching and pain when he swallows.  He denies lip or tongue swelling or history of anaphylaxis.  He does report he has some left-sided chest pressure as well.  He has a history of reflux, gets frequent exacerbations.  He is not certain whether he has ever been on sulfa antibiotics or Bactrim in the past.  He denies that his chest pain is similar to prior angina or cardiac history.  However he reports that his symptoms at the time of his bypass were generalized fatigue, not chest pain.  He is here with his wife at bedside who provides supplemental history.  HPI     Home Medications Prior to Admission medications   Medication Sig Start Date End Date Taking? Authorizing Provider  famotidine (PEPCID) 20 MG tablet Take 1 tablet (20 mg total) by mouth 2 (two) times daily. Take 30 minutes before breakfast and dinner 07/11/21 08/10/21 Yes Lamount Bankson, Carola Rhine, MD  acetaminophen (TYLENOL) 500 MG tablet Take 2 tablets (1,000 mg total) by mouth every 6 (six) hours. 07/08/21   Franchot Gallo, MD  Alirocumab (PRALUENT) 75 MG/ML SOAJ INJECT 1 ML INTO THE SKIN EVERY 14 (FOURTEEN) DAYS. 02/24/21   Sherren Mocha, MD  amLODipine (NORVASC) 5 MG tablet Take 1 tablet (5 mg total) by mouth daily. 05/26/21 08/24/21  Sherren Mocha,  MD  aspirin 81 MG tablet Take 1 tablet (81 mg total) by mouth daily. 10/11/18   Richardson Dopp T, PA-C  baclofen (LIORESAL) 10 MG tablet Take 1 tablet (10 mg total) by mouth 3 (three) times daily. Patient taking differently: Take 10 mg by mouth daily as needed for muscle spasms. 04/22/21   Sater, Nanine Means, MD  ezetimibe (ZETIA) 10 MG tablet Take 1 tablet (10 mg total) by mouth daily. 05/26/21   Sherren Mocha, MD  omeprazole (PRILOSEC) 20 MG capsule Take 1 capsule (20 mg total) by mouth daily. Patient not taking: Reported on 06/10/2021 05/11/21   Charlesetta Shanks, MD  oxyCODONE (OXY IR/ROXICODONE) 5 MG immediate release tablet Take 1 tablet (5 mg total) by mouth every 4 (four) hours as needed for moderate pain or severe pain. 07/08/21   Franchot Gallo, MD  sitaGLIPtin (JANUVIA) 100 MG tablet Take 100 mg by mouth daily.    [provider]  tamsulosin (FLOMAX) 0.4 MG CAPS capsule Take 0.4 mg by mouth at bedtime.    [provider]      Allergies    Antihistamines, diphenhydramine-type; Bactrim [sulfamethoxazole-trimethoprim]; Demerol; Morphine and related; Compazine [prochlorperazine]; Metoprolol; and Statins    Review of Systems   Review of Systems  Physical Exam Updated Vital Signs BP 122/85 (BP Location: Right Arm)    Pulse 84    Temp 99 F (37.2 C) (Oral)    Resp 16  Ht 5\' 9"  (1.753 m)    Wt 88.5 kg    SpO2 98%    BMI 28.80 kg/m  Physical Exam Constitutional:      General: He is not in acute distress. HENT:     Head: Normocephalic and atraumatic.  Eyes:     Conjunctiva/sclera: Conjunctivae normal.     Pupils: Pupils are equal, round, and reactive to light.  Cardiovascular:     Rate and Rhythm: Normal rate and regular rhythm.  Pulmonary:     Effort: Pulmonary effort is normal. No respiratory distress.     Comments: Oropharynx non-erythematous.  No tonsillar swelling or exudate.  No uvular deviation.  No drooling. No brawny edema. No stridor. Voice is not  muffled. Abdominal:     General: There is no distension.     Tenderness: There is no abdominal tenderness.  Skin:    General: Skin is warm and dry.  Neurological:     General: No focal deficit present.     Mental Status: He is alert. Mental status is at baseline.  Psychiatric:        Mood and Affect: Mood normal.        Behavior: Behavior normal.    ED Results / Procedures / Treatments   Labs (all labs ordered are listed, but only abnormal results are displayed) Labs Reviewed  BASIC METABOLIC PANEL - Abnormal; Notable for the following components:      Result Value   Creatinine, Ser 1.39 (*)    All other components within normal limits  CBC  TROPONIN I (HIGH SENSITIVITY)    EKG EKG Interpretation  Date/Time:  Friday July 11 2021 10:51:52 EST Ventricular Rate:  99 PR Interval:  150 QRS Duration: 84 QT Interval:  328 QTC Calculation: 420 R Axis:   68 Text Interpretation: Normal sinus rhythm Normal ECG When compared with ECG of 11-May-2021 20:10, No significant change was found Confirmed by Octaviano Glow [phone]) on 07/11/2021 11:07:46 AM  Radiology DG Chest 2 View  Result Date: 07/11/2021 CLINICAL DATA:  Postop day 2.  Left-sided chest discomfort. EXAM: CHEST - 2 VIEW COMPARISON:  05/11/2021 FINDINGS: No focal consolidation. No pleural effusion or pneumothorax. Heart and mediastinal contours are unremarkable. Prior CABG. No acute osseous abnormality. IMPRESSION: No active cardiopulmonary disease. Electronically Signed   By: Kathreen Devoid M.D.   On: 07/11/2021 11:58    Procedures Procedures    Medications Ordered in ED Medications  alum & mag hydroxide-simeth (MAALOX/MYLANTA) 200-200-20 MG/5ML suspension 30 mL (30 mLs Oral Given 07/11/21 1125)    And  lidocaine (XYLOCAINE) 2 % viscous mouth solution 15 mL (15 mLs Oral Given 07/11/21 1125)  famotidine (PEPCID) IVPB 20 mg premix (0 mg Intravenous Stopped 07/11/21 1235)    ED Course/ Medical Decision Making/ A&P Clinical  Course as of 07/11/21 1659  Fri Jul 11, 2021  1230 Patient reported significant improvement of his chest discomfort after the GI cocktail.  I recommended at this point that he discontinue the Bactrim medication and the fluconazole for now; initiated on Pepcid at home, and follow-up with his PCP.  I have a lower suspicion for ACS at this point. [MT]  [id number] On my third reassessment he continues to feel improved with his medication.  No recurrence of his chest pressure.  Okay for discharge with discontinuation of his Bactrim and fluconazole, which may have caused a pill esophagitis. [MT]    Clinical Course User Index [MT] Chrislynn Mosely, Carola Rhine, MD  Medical Decision Making Amount and/or Complexity of Data Reviewed Labs: ordered. Radiology: ordered.  Risk OTC drugs. Prescription drug management.   This patient presents to the Emergency Department with complaint of chest pain and odynphagia. This involves an extensive number of treatment options, and is a complaint that carries with it a high risk of complications and morbidity, given the patient's comorbidity, including prior hx of CAD & CABG.The differential diagnosis includes ACS vs esophagitis vs Pneumothorax vs Reflux/Gastritis vs MSK pain vs Pneumonia vs other.  His odonyphagia is more consistent with a reaction to his medications or reflux than ACS.  I have ordered a GI cocktail with lidocaine and some Pepcid  I felt PE was less likely given that he has no tachycardia, no hypoxia, and that he has swallowing issues.  I ordered, reviewed, and interpreted labs.  Pertinent results include trop 5 (>12 hours symptoms, unlikely ACS); WBC wnl I ordered medication GI cocktail and pepcid for esophagitis/reflux I ordered imaging studies which included x-ray of the chest I independently visualized and interpreted imaging which showed no acute abrnomalities, and the monitor tracing which showed NSR. I agree with the radiologist  interpretation Additional history was obtained from patient's wife at bedside External records obtained and reviewed showing left heart cath report from May 2022 showing severe two-vessel coronary disease with proximal and mid LAD stenosis to 90 to 95%, as well as 80% stenosis of 2nd diagonal  I personally reviewed the patients ECG which showed sinus rhythm with no acute ischemic findings  After the interventions stated above, I reevaluated the patient and found that they were significantly improved after GI medications  Based on the patient's clinical exam, vital signs, risk factors, and ED testing, I felt that the patient's overall risk of life-threatening emergency such as ACS, PE, sepsis, or infection was low.  At this time, I felt the patient's presentation was most clinically consistent with esophagitis, but explained to the patient that this evaluation was not a definitive diagnostic workup.        Final Clinical Impression(s) / ED Diagnoses Final diagnoses:  Esophagitis    Rx / DC Orders ED Discharge Orders          Ordered    famotidine (PEPCID) 20 MG tablet  2 times daily        07/11/21 1308              Wyvonnia Dusky, MD 07/11/21 1659

## 2021-07-11 NOTE — ED Notes (Signed)
ED Provider at bedside. 

## 2021-07-11 NOTE — Discharge Instructions (Addendum)
I advised that you stop taking your Bactrim and fluconazole that were prescribed after your operation, as they may be causing irritation of your esophagus.  I also advised that you stay away from NSAID medications for the next several days, including ibuprofen and Motrin.  You should hold your aspirin for the next 3 days, then you can restart it for your heart condition.  I recommend using Pepcid twice a day, 30 minutes before meals, to help with your reflux symptoms.  If your chest pain or pressure returns, you begin having sweating or lightheadedness or any other new or concerning symptoms, please come back to the ER.

## 2021-07-11 NOTE — ED Triage Notes (Signed)
Pt was awakened around 230a with tightness in his chest and SOB. Pt also states it feel like it is hard to swallow. Pt reports he had surgery on 1/31 and has been taking sulfamethoxazole, fluconazole, and oxycodone and is concerned it is a reaction.

## 2021-07-11 NOTE — ED Notes (Signed)
Patient transported to X-ray 

## 2021-07-22 NOTE — Progress Notes (Signed)
Cardiology Office Note:    Date:  07/23/2021   ID:  Danny Brewer, DOB [birth year], MRN [id number]  PCP:  Danny Shanks, MD  [facility] Providers Cardiologist:  Danny Mocha, MD Cardiology APP:  Danny Brewer    Referring MD: Danny Shanks, MD   Chief Complaint:  F/u for CAD    Patient Profile: Coronary artery disease  s/p CABG 10/2018 Echocardiogram 4/20: EF 60-65 Intol of beta-blocker  Diabetes mellitus 2 Hypertension  Hyperlipidemia  Intol of statins >> Rx w Alirocumab (Praluent) Carotid stenosis  [place] 10/2018, 10/2019: B/L ICA 1-39 ED   Prior CV Studies: Carotid US 10/21/20 Bilat ICA 1-39  Cardiac Catheterization 10/14/2018 LAD prox 95; D1 ost 80; D2 ost 80 RCA mid 85   Carotid US 10/15/2018 Bilat ICA 1-39   Echo 09/19/2018 EF 60-65   Echo 08/27/15 Mild conc LVH, EF 65-70, no RWMA, trivial MR, trivial TR  History of Present Illness:   Danny Brewer is a 51 y.o. Brewer with the above problem list.  Danny Brewer was last seen in clinic by Dr. Harrington Brewer for the evaluation of chest pain.  Danny Brewer did not require further testing.   Danny Brewer is on Praluent for hyperlipidemia.  Danny Brewer is followed by Danny Brewer who last saw him in 9/22.  Danny Brewer was seen in the ED 05/11/21 with chest pain.  Hs-Trops were neg x 2 and CT was neg for PE.  No further testing was recommended.  Danny Brewer had a recent penile implant with Danny Brewer.  Danny Brewer went to the ED a few days later (on 07/11/21) with chest pain.  His hsTrop was neg.  Notes indicate his pain improved with a GI cocktail.  No further testing was recommended.    Danny Brewer returns for f/u.  Danny Brewer is here alone.  Danny Brewer notes significant issues with famotidine and omeprazole.  Danny Brewer has changed his diet which has improved his acid reflux symptoms.  Danny Brewer does have a gastroenterologist.  Danny Brewer has a lot of chest soreness from his CABG.  This is persisted over the last couple of years without significant change.  Danny Brewer has not had any exertional chest symptoms consistent with angina.  Danny Brewer has not had  shortness of breath, syncope, leg edema.    Past Medical History:  Diagnosis Date   BPH (benign prostatic hyperplasia)    Carotid artery disease ([place])    [place] 5/22: Bilat 6-54    Complication of anesthesia    Pt verble and "prophecizes" after anesthesia   Coronary Artery Disease cardiologist--- dr Brewer   Coronary CTA 09/2018: pLAD 70-90; pD1+pD2 70-90; mRCA mixed plaque - cannot rule out 70-90; FFR suggests hemodynamically significant stenosis // LHC 10/2018: 2v CAD >> s/p  CABG (L-LAD, L radial-RI, S-D2, S-RCA) with Danny Brewer    ED (erectile dysfunction)    GERD    watches diet   History of kidney stones    Hypertension    Mixed hyperlipidemia    OA (osteoarthritis)    neck , back   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   Alirocumab (  PRALUENT) 75 MG/ML SOAJ INJECT 1 ML INTO THE SKIN EVERY 14 (FOURTEEN) DAYS.   amLODipine (NORVASC) 5 MG tablet Take 1 tablet (5 mg total) by mouth daily.   aspirin 81 MG tablet Take 1 tablet (81 mg total) by mouth daily.   ezetimibe (ZETIA) 10 MG tablet Take 1 tablet (10 mg total) by mouth daily.   sitaGLIPtin (JANUVIA) 100 MG tablet Take 100 mg by mouth daily.   tamsulosin (FLOMAX) 0.4 MG CAPS capsule Take 0.4 mg by mouth at bedtime.    Allergies:   Antihistamines, diphenhydramine-type; Bactrim [sulfamethoxazole-trimethoprim]; Demerol; Morphine and related; Compazine [prochlorperazine]; Metoprolol; and Statins   Social History   Tobacco Use   Smoking status: Never   Smokeless tobacco: Never  Vaping Use   Vaping Use: Never used  Substance Use Topics   Alcohol use: No   Drug use: Never    Family Hx: The  patient's family history includes Cancer in his father; Heart attack in his brother; Heart failure in his father; Heart murmur in his sister; Hypertension in his mother and sister; Sudden death in his brother.  Review of Systems  Cardiovascular:  Negative for claudication.    EKGs/Labs/Other Test Reviewed:    EKG:  EKG is not ordered today.  The ekg ordered today demonstrates n/a  EKG from 07/11/2021 personally reviewed and interpreted: NSR, HR 99, normal axis, RSR prime in V1, no acute ST-T wave changes, QTc 420 (no change when compared to prior tracing)  Recent Labs: 07/11/2021: BUN 13; Creatinine, Ser 1.39; Hemoglobin 13.5; Platelets 261; Potassium 4.3; Sodium 139   Recent Lipid Panel Recent Labs    05/15/21 1003  CHOL 240*  TRIG 71  HDL 54  LDLCALC 174*     Risk Assessment/Calculations:         Physical Exam:    VS:  BP 130/80 (BP Location: Right Arm, Patient Position: Sitting, Cuff Size: Normal)    Pulse 97    Ht 5' 9" (1.753 m)    Wt 195 lb 6.4 oz (88.6 kg)    SpO2 98%    BMI 28.86 kg/m     Wt Readings from Last 3 Encounters:  07/23/21 195 lb 6.4 oz (88.6 kg)  07/11/21 195 lb (88.5 kg)  07/07/21 195 lb 8.8 oz (88.7 kg)    Constitutional:      Appearance: Healthy appearance. Not in distress.  Neck:     Vascular: No JVR. JVD normal.  Pulmonary:     Effort: Pulmonary effort is normal.     Breath sounds: No wheezing. No rales.  Cardiovascular:     Normal rate. Regular rhythm. Normal S1. Normal S2.      Murmurs: There is no murmur.  Edema:    Peripheral edema absent.  Abdominal:     Palpations: Abdomen is soft. There is no hepatomegaly.  Skin:    General: Skin is warm and dry.  Neurological:     General: No focal deficit present.     Mental Status: Alert and oriented to person, place and time.     Cranial Nerves: Cranial nerves are intact.        ASSESSMENT & PLAN:   Coronary artery disease History of CABG in 2020.  Danny Brewer has had episodes of chest discomfort  that are related to musculoskeletal pain as well as acid reflux.  His acid reflux symptoms have improved with dietary modifications.  Danny Brewer does have intolerances to omeprazole and famotidine.  I have asked him to follow-up with  his gastroenterologist for acid reflux.  Danny Brewer has not had symptoms of angina. Danny Brewer did mention that Danny Brewer is applying for disability.  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  I did mention that Danny Brewer may qualify for vocational rehab.  Continue aspirin 81 mg daily, Alirocumab 75 mg twice monthly, ezetimibe 10 mg daily.  Follow-up in 6 months.  Carotid artery disease ([place]) Carotid ultrasound in 5/22 demonstrated bilateral ICA stenosis 1-39%.  Continue aspirin, Alirocumab.  Plan clinical follow-up.  Essential hypertension Blood pressure is well controlled.  Continue amlodipine 5 mg daily.  Hyperlipidemia LDL in December was 174.  Notes indicate Danny Brewer was off his Praluent at that time.  Danny Brewer indicates that Danny Brewer is now taking Praluent and ezetimibe as prescribed.  Obtain fasting CMET, lipids today.  If his LDL is above goal, consider increasing dose versus referring back to Danny Brewer to consider siRNA Rx.            Dispo:  Return in about 6 months (around 01/20/2022) for Routine follow up in 6 months with Dr. Burt Knack. .   Medication Adjustments/Labs and Tests Ordered: Current medicines are reviewed at length with the patient today.  Concerns regarding medicines are outlined above.  Tests Ordered: Orders Placed This Encounter  Procedures   Lipid Profile   Comp Met (CMET)   Medication Changes: No orders of the defined types were placed in this encounter.  Signed, Richardson Dopp, PA-C  07/23/2021 9:25 AM    [facility] [address], [place], [place]  [address] Phone: [phone]; Fax: [phone]

## 2021-07-23 ENCOUNTER — Other Ambulatory Visit: Payer: Self-pay

## 2021-07-23 ENCOUNTER — Encounter: Payer: Self-pay | Admitting: Physician Assistant

## 2021-07-23 ENCOUNTER — Ambulatory Visit (INDEPENDENT_AMBULATORY_CARE_PROVIDER_SITE_OTHER): Payer: BC Managed Care – PPO | Admitting: Physician Assistant

## 2021-07-23 VITALS — BP 130/80 | HR 97 | Ht 69.0 in | Wt 195.4 lb

## 2021-07-23 DIAGNOSIS — I6523 Occlusion and stenosis of bilateral carotid arteries: Secondary | ICD-10-CM

## 2021-07-23 DIAGNOSIS — E7800 Pure hypercholesterolemia, unspecified: Secondary | ICD-10-CM

## 2021-07-23 DIAGNOSIS — I2510 Atherosclerotic heart disease of native coronary artery without angina pectoris: Secondary | ICD-10-CM

## 2021-07-23 DIAGNOSIS — I10 Essential (primary) hypertension: Secondary | ICD-10-CM | POA: Diagnosis not present

## 2021-07-23 LAB — LIPID PANEL
Chol/HDL Ratio: 2.4 ratio (ref 0.0–5.0)
Cholesterol, Total: 136 mg/dL (ref 100–199)
HDL: 56 mg/dL (ref >39)
LDL Chol Calc (NIH): 69 mg/dL (ref 0–99)
Triglycerides: 50 mg/dL (ref 0–149)
VLDL Cholesterol Cal: 11 mg/dL (ref 5–40)

## 2021-07-23 LAB — COMPREHENSIVE METABOLIC PANEL
ALT: 23 IU/L (ref 0–44)
AST: 24 IU/L (ref 0–40)
Albumin/Globulin Ratio: 1.8 (ref 1.2–2.2)
Albumin: 4.5 g/dL (ref 3.8–4.9)
Alkaline Phosphatase: 81 IU/L (ref 44–121)
BUN/Creatinine Ratio: 14 (ref 9–20)
BUN: 18 mg/dL (ref 6–24)
Bilirubin Total: 0.3 mg/dL (ref 0.0–1.2)
CO2: 21 mmol/L (ref 20–29)
Calcium: 9.2 mg/dL (ref 8.7–10.2)
Chloride: 103 mmol/L (ref 96–106)
Creatinine, Ser: 1.3 mg/dL — ABNORMAL HIGH (ref 0.76–1.27)
Globulin, Total: 2.5 g/dL (ref 1.5–4.5)
Glucose: 90 mg/dL (ref 70–99)
Potassium: 4.1 mmol/L (ref 3.5–5.2)
Sodium: 140 mmol/L (ref 134–144)
Total Protein: 7 g/dL (ref 6.0–8.5)
eGFR: 66 mL/min/{1.73_m2} (ref >59)

## 2021-07-23 NOTE — Assessment & Plan Note (Signed)
Carotid ultrasound in 5/22 demonstrated bilateral ICA stenosis 1-39%.  Continue aspirin, Alirocumab.  Plan clinical follow-up.

## 2021-07-23 NOTE — Assessment & Plan Note (Signed)
LDL in December was 174.  Notes indicate he was off his Praluent at that time.  He indicates that he is now taking Praluent and ezetimibe as prescribed.  Obtain fasting CMET, lipids today.  If his LDL is above goal, consider increasing dose versus referring back to Dr. Rennis Golden to consider siRNA Rx.

## 2021-07-23 NOTE — Assessment & Plan Note (Signed)
Blood pressure is well controlled Continue amlodipine 5 mg daily  

## 2021-07-23 NOTE — Patient Instructions (Signed)
Medication Instructions:   Your physician recommends that you continue on your current medications as directed. Please refer to the Current Medication list given to you today.   *If you need a refill on your cardiac medications before your next appointment, please call your pharmacy*   Lab Work:  TODAY!!!!  CMET/LIPID  If you have labs (blood work) drawn today and your tests are completely normal, you will receive your results only by: MyChart Message (if you have MyChart) OR A paper copy in the mail If you have any lab test that is abnormal or we need to change your treatment, we will call you to review the results.   Testing/Procedures:  None ordered.    Follow-Up: At [facility], you and your health needs are our priority.  As part of our continuing mission to provide you with exceptional heart care, we have created designated Provider Care Teams.  These Care Teams include your primary Cardiologist (physician) and Advanced Practice Providers (APPs -  Physician Assistants and Nurse Practitioners) who all work together to provide you with the care you need, when you need it.  We recommend signing up for the patient portal called "MyChart".  Sign up information is provided on this After Visit Summary.  MyChart is used to connect with patients for Virtual Visits (Telemedicine).  Patients are able to view lab/test results, encounter notes, upcoming appointments, etc.  Non-urgent messages can be sent to your provider as well.   To learn more about what you can do with MyChart, go to [email or web address].    Your next appointment:   6 month(s)  The format for your next appointment:   In Person  Provider:   Tonny Bollman, MD

## 2021-07-23 NOTE — Assessment & Plan Note (Addendum)
History of CABG in 2020.  He has had episodes of chest discomfort that are related to musculoskeletal pain as well as acid reflux.  His acid reflux symptoms have improved with dietary modifications.  He does have intolerances to omeprazole and famotidine.  I have asked him to follow-up with his gastroenterologist for acid reflux.  He has not had symptoms of angina. He did mention that he is applying for disability.  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  I did mention that he may qualify for vocational rehab.  Continue aspirin 81 mg daily, Alirocumab 75 mg twice monthly, ezetimibe 10 mg daily.  Follow-up in 6 months.

## 2021-07-30 ENCOUNTER — Ambulatory Visit: Payer: BC Managed Care – PPO | Admitting: Internal Medicine

## 2021-09-01 ENCOUNTER — Telehealth: Payer: Self-pay | Admitting: Internal Medicine

## 2021-09-01 NOTE — Telephone Encounter (Signed)
PA for praluent was faxed to [facility] ?It is approved 08/25/21 -- 08/26/22 ?

## 2021-09-18 ENCOUNTER — Telehealth: Payer: Self-pay | Admitting: Neurology

## 2021-09-18 NOTE — Telephone Encounter (Signed)
Patient left a voicemail on my phone stating he is ready to schedule his MRI. ? ?[employer] Numbers: [id number] (exp. 09/18/21 to 10/17/21) [facility] community pending uploaded notes  ?

## 2021-09-22 NOTE — Telephone Encounter (Signed)
[facility] auth: [id number] (exp. 09/22/21 to 11/06/21) order sent to GI, they will reach out to the patient to schedule.  ?

## 2021-10-09 ENCOUNTER — Inpatient Hospital Stay: Admission: RE | Admit: 2021-10-09 | Payer: BC Managed Care – PPO | Source: Ambulatory Visit

## 2021-10-16 ENCOUNTER — Other Ambulatory Visit: Payer: BC Managed Care – PPO

## 2021-10-22 ENCOUNTER — Ambulatory Visit: Payer: BC Managed Care – PPO | Admitting: Neurology

## 2021-10-25 ENCOUNTER — Other Ambulatory Visit: Payer: BC Managed Care – PPO

## 2021-11-04 ENCOUNTER — Telehealth: Payer: Self-pay | Admitting: Neurology

## 2021-11-04 NOTE — Telephone Encounter (Signed)
Called pt back. Headaches are a new sx for him. Dr. Epimenio Foot has not evaluated him for this. He was last seen by Dr. Epimenio Foot 04/22/21. Recommended he f/u with PCP first for new sx. If they feel he needs to f/u with Dr. Epimenio Foot after for headaches, they will need to place referral and we would be happy to get him scheduled.  He will call today to see about getting appt w/ PCP for further evaluation.  He has MRI brain scheduled for 11/09/21 at 10am at [facility] imaging that Dr. Epimenio Foot ordered back on 04/22/21.

## 2021-11-04 NOTE — Telephone Encounter (Signed)
Pt said, started 10/30/21 having headaches, top and left side of head, level of pain between 5 and 7. Taking Tylenol, but headache does not go away. Have not seen PCP. Would like a call from the nurse

## 2021-11-09 ENCOUNTER — Ambulatory Visit
Admission: RE | Admit: 2021-11-09 | Discharge: 2021-11-09 | Disposition: A | Discharge status: Home / Self Care | Payer: BC Managed Care – PPO | Source: Ambulatory Visit | Attending: Neurology | Admitting: Neurology

## 2021-11-09 DIAGNOSIS — G5132 Clonic hemifacial spasm, left: Secondary | ICD-10-CM

## 2021-11-09 DIAGNOSIS — R519 Headache, unspecified: Secondary | ICD-10-CM

## 2021-11-09 MED ORDER — GADOBENATE DIMEGLUMINE 529 MG/ML IV SOLN
18.0000 mL | Freq: Once | INTRAVENOUS | Status: AC | PRN
  Administered 2021-11-09: 18 mL via INTRAVENOUS

## 2021-11-11 ENCOUNTER — Telehealth: Payer: Self-pay | Admitting: Neurology

## 2021-11-11 NOTE — Telephone Encounter (Signed)
I discussed the MRI findings with Danny Brewer.  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  It is in a location that likely explains his left hemifacial spasms  He did not tolerate baclofen.  We will do Botox injections to help the hemifacial spasms.  I will fill out a form and try to get him scheduled within a couple weeks.

## 2021-11-12 NOTE — Telephone Encounter (Signed)
Dr. 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 Gave form to [place] to process.

## 2021-12-23 ENCOUNTER — Other Ambulatory Visit: Payer: Self-pay

## 2021-12-23 ENCOUNTER — Emergency Department (HOSPITAL_BASED_OUTPATIENT_CLINIC_OR_DEPARTMENT_OTHER)
Admission: EM | Admit: 2021-12-23 | Discharge: 2021-12-24 | Disposition: A | Discharge status: Home / Self Care | Payer: BC Managed Care – PPO | Attending: Emergency Medicine | Admitting: Emergency Medicine

## 2021-12-23 ENCOUNTER — Encounter (HOSPITAL_BASED_OUTPATIENT_CLINIC_OR_DEPARTMENT_OTHER): Payer: Self-pay

## 2021-12-23 DIAGNOSIS — Z7984 Long term (current) use of oral hypoglycemic drugs: Secondary | ICD-10-CM | POA: Insufficient documentation

## 2021-12-23 DIAGNOSIS — R1013 Epigastric pain: Secondary | ICD-10-CM | POA: Diagnosis present

## 2021-12-23 DIAGNOSIS — Z7982 Long term (current) use of aspirin: Secondary | ICD-10-CM | POA: Diagnosis not present

## 2021-12-23 DIAGNOSIS — Z951 Presence of aortocoronary bypass graft: Secondary | ICD-10-CM | POA: Diagnosis not present

## 2021-12-23 DIAGNOSIS — E119 Type 2 diabetes mellitus without complications: Secondary | ICD-10-CM | POA: Diagnosis not present

## 2021-12-23 DIAGNOSIS — I2510 Atherosclerotic heart disease of native coronary artery without angina pectoris: Secondary | ICD-10-CM | POA: Insufficient documentation

## 2021-12-23 LAB — COMPREHENSIVE METABOLIC PANEL
ALT: 9 U/L (ref 0–44)
AST: 18 U/L (ref 15–41)
Albumin: 4.4 g/dL (ref 3.5–5.0)
Alkaline Phosphatase: 61 U/L (ref 38–126)
Anion gap: 10 (ref 5–15)
BUN: 13 mg/dL (ref 6–20)
CO2: 24 mmol/L (ref 22–32)
Calcium: 9.1 mg/dL (ref 8.9–10.3)
Chloride: 104 mmol/L (ref 98–111)
Creatinine, Ser: 1.22 mg/dL (ref 0.61–1.24)
GFR, Estimated: >60 mL/min (ref >60)
Glucose, Bld: 91 mg/dL (ref 70–99)
Potassium: 3.9 mmol/L (ref 3.5–5.1)
Sodium: 138 mmol/L (ref 135–145)
Total Bilirubin: 0.5 mg/dL (ref 0.3–1.2)
Total Protein: 7.2 g/dL (ref 6.5–8.1)

## 2021-12-23 LAB — URINALYSIS, ROUTINE W REFLEX MICROSCOPIC
Bilirubin Urine: NEGATIVE
Glucose, UA: NEGATIVE mg/dL
Hgb urine dipstick: NEGATIVE
Ketones, ur: NEGATIVE mg/dL
Leukocytes,Ua: NEGATIVE
Nitrite: NEGATIVE
Protein, ur: NEGATIVE mg/dL
Specific Gravity, Urine: 1.018 (ref 1.005–1.030)
pH: 6.5 (ref 5.0–8.0)

## 2021-12-23 LAB — CBC
HCT: 43.9 % (ref 39.0–52.0)
Hemoglobin: 14.2 g/dL (ref 13.0–17.0)
MCH: 27.9 pg (ref 26.0–34.0)
MCHC: 32.3 g/dL (ref 30.0–36.0)
MCV: 86.2 fL (ref 80.0–100.0)
Platelets: 225 10*3/uL (ref 150–400)
RBC: 5.09 MIL/uL (ref 4.22–5.81)
RDW: 12.7 % (ref 11.5–15.5)
WBC: 5.7 10*3/uL (ref 4.0–10.5)
nRBC: 0 % (ref 0.0–0.2)

## 2021-12-23 LAB — LIPASE, BLOOD: Lipase: <10 U/L — ABNORMAL LOW (ref 11–51)

## 2021-12-23 MED ORDER — ONDANSETRON HCL 4 MG/2ML IJ SOLN
4.0000 mg | Freq: Once | INTRAMUSCULAR | Status: AC
  Administered 2021-12-24: 4 mg via INTRAVENOUS
  Filled 2021-12-23: qty 2

## 2021-12-23 MED ORDER — HYDROMORPHONE HCL 1 MG/ML IJ SOLN
1.0000 mg | Freq: Once | INTRAMUSCULAR | Status: AC
Start: 2021-12-24 — End: 2021-12-24
  Administered 2021-12-24: 1 mg via INTRAVENOUS
  Filled 2021-12-23: qty 1

## 2021-12-23 MED ORDER — SODIUM CHLORIDE 0.9 % IV BOLUS
1000.0000 mL | Freq: Once | INTRAVENOUS | Status: AC
  Administered 2021-12-24: 1000 mL via INTRAVENOUS

## 2021-12-23 NOTE — ED Provider Notes (Signed)
[facility] EMERGENCY DEPT Provider Note   CSN: [id number] Arrival date & time: 12/23/21  1905     History  Chief Complaint  Patient presents with   Abdominal Pain    Danny Brewer is a 51 y.o. male.  Patient is a 51 year old male with past medical history of coronary artery disease with CABG, ventral abdominal hernia repair, GERD, obstructive sleep apnea, type 2 diabetes.  Patient presenting today with complaints of epigastric pain.  This has been constant and worsening over the past 5 days.  He went to the primary doctor's office today, then was referred here for a CT scan to further evaluate his gallbladder and for other pathology.  Patient denies any bowel or bladder complaints.  He denies any fevers or chills.  The history is provided by the patient.  Abdominal Pain Pain location:  Epigastric Pain quality: aching   Pain radiates to:  Does not radiate Pain severity:  Moderate Onset quality:  Gradual Duration:  5 days Timing:  Constant Progression:  Worsening Chronicity:  New Relieved by:  Nothing Worsened by:  Movement and palpation      Home Medications Prior to Admission medications   Medication Sig Start Date End Date Taking? Authorizing Provider  acetaminophen (TYLENOL) 500 MG tablet Take 2 tablets (1,000 mg total) by mouth every 6 (six) hours. 07/08/21   Marcine Matar, MD  Alirocumab (PRALUENT) 75 MG/ML SOAJ INJECT 1 ML INTO THE SKIN EVERY 14 (FOURTEEN) DAYS. 02/24/21   Tonny Bollman, MD  amLODipine (NORVASC) 5 MG tablet Take 1 tablet (5 mg total) by mouth daily. 05/26/21 08/24/21  Tonny Bollman, MD  aspirin 81 MG tablet Take 1 tablet (81 mg total) by mouth daily. 10/11/18   Tereso Newcomer T, PA-C  ezetimibe (ZETIA) 10 MG tablet Take 1 tablet (10 mg total) by mouth daily. 05/26/21   Tonny Bollman, MD  sitaGLIPtin (JANUVIA) 100 MG tablet Take 100 mg by mouth daily.    [provider]  tamsulosin (FLOMAX) 0.4 MG CAPS capsule Take 0.4 mg  by mouth at bedtime.    [provider]      Allergies    Antihistamines, diphenhydramine-type; Bactrim [sulfamethoxazole-trimethoprim]; Demerol; Morphine and related; Compazine [prochlorperazine]; Metoprolol; and Statins    Review of Systems   Review of Systems  Gastrointestinal:  Positive for abdominal pain.  All other systems reviewed and are negative.   Physical Exam Updated Vital Signs BP (!) 140/98   Pulse 65   Temp 97.9 F (36.6 C)   Resp 18   Ht 5\' 9"  (1.753 m)   Wt 88.6 kg   SpO2 99%   BMI 28.84 kg/m  Physical Exam Vitals and nursing note reviewed.  Constitutional:      General: He is not in acute distress.    Appearance: He is well-developed. He is not diaphoretic.  HENT:     Head: Normocephalic and atraumatic.  Cardiovascular:     Rate and Rhythm: Normal rate and regular rhythm.     Heart sounds: No murmur heard.    No friction rub.  Pulmonary:     Effort: Pulmonary effort is normal. No respiratory distress.     Breath sounds: Normal breath sounds. No wheezing or rales.  Abdominal:     General: Bowel sounds are normal. There is no distension.     Palpations: Abdomen is soft.     Tenderness: There is abdominal tenderness in the epigastric area. There is no right CVA tenderness, left CVA tenderness, guarding or  rebound.  Musculoskeletal:        General: Normal range of motion.     Cervical back: Normal range of motion and neck supple.  Skin:    General: Skin is warm and dry.  Neurological:     Mental Status: He is alert and oriented to person, place, and time.     Coordination: Coordination normal.     ED Results / Procedures / Treatments   Labs (all labs ordered are listed, but only abnormal results are displayed) Labs Reviewed  LIPASE, BLOOD - Abnormal; Notable for the following components:      Result Value   Lipase <10 (*)    All other components within normal limits  COMPREHENSIVE METABOLIC PANEL  CBC  URINALYSIS, ROUTINE W REFLEX  MICROSCOPIC    EKG None  Radiology No results found.  Procedures Procedures  {Document cardiac monitor, telemetry assessment procedure when appropriate:1}  Medications Ordered in ED Medications  sodium chloride 0.9 % bolus 1,000 mL (has no administration in time range)  ondansetron (ZOFRAN) injection 4 mg (has no administration in time range)  HYDROmorphone (DILAUDID) injection 1 mg (has no administration in time range)    ED Course/ Medical Decision Making/ A&P                           Medical Decision Making Amount and/or Complexity of Data Reviewed Labs: ordered. Radiology: ordered.  Risk Prescription drug management.   ***  {Document critical care time when appropriate:1} {Document review of labs and clinical decision tools ie heart score, Chads2Vasc2 etc:1}  {Document your independent review of radiology images, and any outside records:1} {Document your discussion with family members, caretakers, and with consultants:1} {Document social determinants of health affecting pt's care:1} {Document your decision making why or why not admission, treatments were needed:1} Final Clinical Impression(s) / ED Diagnoses Final diagnoses:  None    Rx / DC Orders ED Discharge Orders     None

## 2021-12-23 NOTE — ED Triage Notes (Signed)
Patient here POV from Clinic.  Endorses Mid ABD Pain that began 2-3 Weeks. Worsened Since, Especially Today.   Moderate Nausea Today. No Emesis. No Changes in BM. No Urinary Symptoms. No Fevers.   NAD Noted during Triage. A&Ox4. GCS 15. Ambulatory.

## 2021-12-24 ENCOUNTER — Encounter (HOSPITAL_BASED_OUTPATIENT_CLINIC_OR_DEPARTMENT_OTHER): Payer: Self-pay | Admitting: Radiology

## 2021-12-24 ENCOUNTER — Emergency Department (HOSPITAL_BASED_OUTPATIENT_CLINIC_OR_DEPARTMENT_OTHER): Payer: BC Managed Care – PPO

## 2021-12-24 MED ORDER — IOHEXOL 300 MG/ML  SOLN
100.0000 mL | Freq: Once | INTRAMUSCULAR | Status: AC | PRN
  Administered 2021-12-24: 100 mL via INTRAVENOUS

## 2021-12-24 MED ORDER — HYDROCODONE-ACETAMINOPHEN 5-325 MG PO TABS: 1.0000 | ORAL_TABLET | Freq: Four times a day (QID) | ORAL | 0 refills | Status: DC | PRN

## 2021-12-24 NOTE — ED Notes (Signed)
Reviewed AVS/discharge instruction with patient. Time allotted for and all questions answered. Patient is agreeable for d/c and escorted to ed exit by staff.  

## 2021-12-24 NOTE — Discharge Instructions (Signed)
Begin taking Prilosec 20 mg twice daily for the next 2 weeks.  Take hydrocodone as prescribed as needed for pain.  Follow-up with your gastroenterologist if symptoms are not improving in the next week, and return to the ER if symptoms significantly worsen or change.

## 2021-12-30 ENCOUNTER — Ambulatory Visit (HOSPITAL_BASED_OUTPATIENT_CLINIC_OR_DEPARTMENT_OTHER)
Admission: EM | Admit: 2021-12-30 | Discharge: 2021-12-30 | Disposition: A | Discharge status: Home / Self Care | Payer: BC Managed Care – PPO | Attending: Emergency Medicine | Admitting: Emergency Medicine

## 2021-12-30 ENCOUNTER — Encounter (HOSPITAL_COMMUNITY): Payer: Self-pay

## 2021-12-30 ENCOUNTER — Encounter (HOSPITAL_COMMUNITY)
Admission: EM | Disposition: A | Discharge status: Home / Self Care | Payer: Self-pay | Source: Home / Self Care | Attending: Emergency Medicine

## 2021-12-30 ENCOUNTER — Emergency Department (HOSPITAL_BASED_OUTPATIENT_CLINIC_OR_DEPARTMENT_OTHER): Payer: BC Managed Care – PPO

## 2021-12-30 ENCOUNTER — Emergency Department (HOSPITAL_COMMUNITY): Payer: BC Managed Care – PPO | Admitting: Certified Registered Nurse Anesthetist

## 2021-12-30 ENCOUNTER — Other Ambulatory Visit: Payer: Self-pay

## 2021-12-30 ENCOUNTER — Emergency Department (HOSPITAL_COMMUNITY): Payer: BC Managed Care – PPO

## 2021-12-30 DIAGNOSIS — E119 Type 2 diabetes mellitus without complications: Secondary | ICD-10-CM | POA: Diagnosis not present

## 2021-12-30 DIAGNOSIS — N400 Enlarged prostate without lower urinary tract symptoms: Secondary | ICD-10-CM | POA: Diagnosis not present

## 2021-12-30 DIAGNOSIS — I10 Essential (primary) hypertension: Secondary | ICD-10-CM | POA: Insufficient documentation

## 2021-12-30 DIAGNOSIS — N529 Male erectile dysfunction, unspecified: Secondary | ICD-10-CM | POA: Diagnosis not present

## 2021-12-30 DIAGNOSIS — I2510 Atherosclerotic heart disease of native coronary artery without angina pectoris: Secondary | ICD-10-CM | POA: Insufficient documentation

## 2021-12-30 DIAGNOSIS — Z951 Presence of aortocoronary bypass graft: Secondary | ICD-10-CM | POA: Diagnosis not present

## 2021-12-30 DIAGNOSIS — R109 Unspecified abdominal pain: Secondary | ICD-10-CM | POA: Insufficient documentation

## 2021-12-30 DIAGNOSIS — N201 Calculus of ureter: Secondary | ICD-10-CM | POA: Diagnosis not present

## 2021-12-30 HISTORY — PX: CYSTOSCOPY WITH RETROGRADE PYELOGRAM, URETEROSCOPY AND STENT PLACEMENT: SHX5789

## 2021-12-30 LAB — URINALYSIS, ROUTINE W REFLEX MICROSCOPIC
Bilirubin Urine: NEGATIVE
Glucose, UA: NEGATIVE mg/dL
Ketones, ur: NEGATIVE mg/dL
Leukocytes,Ua: NEGATIVE
Nitrite: NEGATIVE
Protein, ur: NEGATIVE mg/dL
Specific Gravity, Urine: >1.046 — ABNORMAL HIGH (ref 1.005–1.030)
pH: 7 (ref 5.0–8.0)

## 2021-12-30 LAB — COMPREHENSIVE METABOLIC PANEL
ALT: 12 U/L (ref 0–44)
AST: 16 U/L (ref 15–41)
Albumin: 4.3 g/dL (ref 3.5–5.0)
Alkaline Phosphatase: 55 U/L (ref 38–126)
Anion gap: 12 (ref 5–15)
BUN: 14 mg/dL (ref 6–20)
CO2: 25 mmol/L (ref 22–32)
Calcium: 8.9 mg/dL (ref 8.9–10.3)
Chloride: 105 mmol/L (ref 98–111)
Creatinine, Ser: 1.35 mg/dL — ABNORMAL HIGH (ref 0.61–1.24)
GFR, Estimated: >60 mL/min (ref >60)
Glucose, Bld: 146 mg/dL — ABNORMAL HIGH (ref 70–99)
Potassium: 3.8 mmol/L (ref 3.5–5.1)
Sodium: 142 mmol/L (ref 135–145)
Total Bilirubin: 0.4 mg/dL (ref 0.3–1.2)
Total Protein: 6.9 g/dL (ref 6.5–8.1)

## 2021-12-30 LAB — CBC
HCT: 45.1 % (ref 39.0–52.0)
Hemoglobin: 14.3 g/dL (ref 13.0–17.0)
MCH: 27.7 pg (ref 26.0–34.0)
MCHC: 31.7 g/dL (ref 30.0–36.0)
MCV: 87.2 fL (ref 80.0–100.0)
Platelets: 221 10*3/uL (ref 150–400)
RBC: 5.17 MIL/uL (ref 4.22–5.81)
RDW: 12.5 % (ref 11.5–15.5)
WBC: 6.7 10*3/uL (ref 4.0–10.5)
nRBC: 0 % (ref 0.0–0.2)

## 2021-12-30 LAB — LACTIC ACID, PLASMA: Lactic Acid, Venous: 1.6 mmol/L (ref 0.5–1.9)

## 2021-12-30 LAB — LIPASE, BLOOD: Lipase: 10 U/L — ABNORMAL LOW (ref 11–51)

## 2021-12-30 LAB — GLUCOSE, CAPILLARY: Glucose-Capillary: 109 mg/dL — ABNORMAL HIGH (ref 70–99)

## 2021-12-30 LAB — TROPONIN I (HIGH SENSITIVITY): Troponin I (High Sensitivity): <2 ng/L (ref <18)

## 2021-12-30 LAB — CBG MONITORING, ED: Glucose-Capillary: 96 mg/dL (ref 70–99)

## 2021-12-30 SURGERY — CYSTOURETEROSCOPY, WITH RETROGRADE PYELOGRAM AND STENT INSERTION
Anesthesia: General | Laterality: Right

## 2021-12-30 MED ORDER — TAMSULOSIN HCL 0.4 MG PO CAPS
0.4000 mg | ORAL_CAPSULE | Freq: Once | ORAL | Status: AC
  Administered 2021-12-30: 0.4 mg via ORAL
  Filled 2021-12-30: qty 1

## 2021-12-30 MED ORDER — PHENYLEPHRINE 80 MCG/ML (10ML) SYRINGE FOR IV PUSH (FOR BLOOD PRESSURE SUPPORT)
PREFILLED_SYRINGE | INTRAVENOUS | Status: AC
  Filled 2021-12-30: qty 40

## 2021-12-30 MED ORDER — CEPHALEXIN 500 MG PO CAPS: 500.0000 mg | ORAL_CAPSULE | Freq: Two times a day (BID) | ORAL | 0 refills | Status: AC

## 2021-12-30 MED ORDER — HYDROCODONE-ACETAMINOPHEN 5-325 MG PO TABS: 1.0000 | ORAL_TABLET | Freq: Four times a day (QID) | ORAL | 0 refills | Status: DC | PRN

## 2021-12-30 MED ORDER — KETOROLAC TROMETHAMINE 15 MG/ML IJ SOLN
15.0000 mg | Freq: Once | INTRAMUSCULAR | Status: AC
  Administered 2021-12-30: 15 mg via INTRAVENOUS
  Filled 2021-12-30: qty 1

## 2021-12-30 MED ORDER — KETOROLAC TROMETHAMINE 10 MG PO TABS: 10.0000 mg | ORAL_TABLET | Freq: Three times a day (TID) | ORAL | 0 refills | Status: DC | PRN

## 2021-12-30 MED ORDER — HYDROMORPHONE HCL 1 MG/ML IJ SOLN
1.0000 mg | Freq: Once | INTRAMUSCULAR | Status: AC
Start: 2021-12-30 — End: 2021-12-30
  Administered 2021-12-30: 1 mg via INTRAVENOUS
  Filled 2021-12-30: qty 1

## 2021-12-30 MED ORDER — LACTATED RINGERS IV BOLUS
1000.0000 mL | Freq: Once | INTRAVENOUS | Status: AC
  Administered 2021-12-30: 1000 mL via INTRAVENOUS

## 2021-12-30 MED ORDER — PHENYLEPHRINE 80 MCG/ML (10ML) SYRINGE FOR IV PUSH (FOR BLOOD PRESSURE SUPPORT)
PREFILLED_SYRINGE | INTRAVENOUS | Status: DC | PRN
  Administered 2021-12-30 (×2): 80 ug via INTRAVENOUS

## 2021-12-30 MED ORDER — PROPOFOL 10 MG/ML IV BOLUS
INTRAVENOUS | Status: AC
  Filled 2021-12-30: qty 20

## 2021-12-30 MED ORDER — LIDOCAINE HCL (PF) 2 % IJ SOLN
INTRAMUSCULAR | Status: AC
  Filled 2021-12-30: qty 5

## 2021-12-30 MED ORDER — HYDROMORPHONE HCL 1 MG/ML IJ SOLN
1.0000 mg | Freq: Once | INTRAMUSCULAR | Status: AC
  Administered 2021-12-30: 1 mg via INTRAVENOUS
  Filled 2021-12-30: qty 1

## 2021-12-30 MED ORDER — DEXAMETHASONE SODIUM PHOSPHATE 10 MG/ML IJ SOLN
INTRAMUSCULAR | Status: AC
  Filled 2021-12-30: qty 1

## 2021-12-30 MED ORDER — SODIUM CHLORIDE 0.9 % IR SOLN
Status: DC | PRN
  Administered 2021-12-30: 3000 mL via INTRAVESICAL

## 2021-12-30 MED ORDER — ONDANSETRON HCL 4 MG/2ML IJ SOLN
INTRAMUSCULAR | Status: DC | PRN
  Administered 2021-12-30: 4 mg via INTRAVENOUS

## 2021-12-30 MED ORDER — LIDOCAINE 5 % EX PTCH
1.0000 | MEDICATED_PATCH | CUTANEOUS | Status: DC
  Administered 2021-12-30: 1 via TRANSDERMAL
  Filled 2021-12-30: qty 1

## 2021-12-30 MED ORDER — DEXAMETHASONE SODIUM PHOSPHATE 10 MG/ML IJ SOLN
INTRAMUSCULAR | Status: DC | PRN
  Administered 2021-12-30: 4 mg via INTRAVENOUS

## 2021-12-30 MED ORDER — LACTATED RINGERS IV SOLN: INTRAVENOUS | Status: DC

## 2021-12-30 MED ORDER — LIDOCAINE 2% (20 MG/ML) 5 ML SYRINGE
INTRAMUSCULAR | Status: DC | PRN
  Administered 2021-12-30: 60 mg via INTRAVENOUS

## 2021-12-30 MED ORDER — CEFAZOLIN SODIUM-DEXTROSE 2-4 GM/100ML-% IV SOLN
2.0000 g | INTRAVENOUS | Status: AC
  Administered 2021-12-30: 2 g via INTRAVENOUS
  Filled 2021-12-30: qty 100

## 2021-12-30 MED ORDER — ONDANSETRON HCL 4 MG/2ML IJ SOLN
4.0000 mg | Freq: Once | INTRAMUSCULAR | Status: AC
  Administered 2021-12-30: 4 mg via INTRAVENOUS
  Filled 2021-12-30: qty 2

## 2021-12-30 MED ORDER — SENNOSIDES-DOCUSATE SODIUM 8.6-50 MG PO TABS: 1.0000 | ORAL_TABLET | Freq: Two times a day (BID) | ORAL | 0 refills | Status: DC

## 2021-12-30 MED ORDER — PROPOFOL 10 MG/ML IV BOLUS
INTRAVENOUS | Status: DC | PRN
  Administered 2021-12-30: 200 mg via INTRAVENOUS

## 2021-12-30 MED ORDER — IOHEXOL 350 MG/ML SOLN
100.0000 mL | Freq: Once | INTRAVENOUS | Status: AC | PRN
  Administered 2021-12-30: 85 mL via INTRAVENOUS

## 2021-12-30 MED ORDER — IOHEXOL 300 MG/ML  SOLN
INTRAMUSCULAR | Status: DC | PRN
  Administered 2021-12-30: 9 mL via URETHRAL

## 2021-12-30 MED ORDER — ONDANSETRON HCL 4 MG/2ML IJ SOLN
INTRAMUSCULAR | Status: AC
  Filled 2021-12-30: qty 2

## 2021-12-30 SURGICAL SUPPLY — 24 items
BAG URO CATCHER STRL LF (MISCELLANEOUS) ×2 IMPLANT
BASKET LASER NITINOL 1.9FR (BASKET) IMPLANT
BSKT STON RTRVL 120 1.9FR (BASKET)
CATH URETL OPEN END 6FR 70 (CATHETERS) ×2 IMPLANT
CLOTH BEACON ORANGE TIMEOUT ST (SAFETY) ×1 IMPLANT
EXTRACTOR STONE 1.7FRX115CM (UROLOGICAL SUPPLIES) IMPLANT
GLOVE SURG LX 7.5 STRW (GLOVE) ×3
GLOVE SURG LX STRL 7.5 STRW (GLOVE) ×1 IMPLANT
GOWN SRG XL LVL 4 BRTHBL STRL (GOWNS) ×1 IMPLANT
GOWN STRL NON-REIN XL LVL4 (GOWNS) ×4
GUIDEWIRE ANG ZIPWIRE 038X150 (WIRE) ×2 IMPLANT
GUIDEWIRE STR DUAL SENSOR (WIRE) ×2 IMPLANT
KIT TURNOVER KIT A (KITS) ×1 IMPLANT
LASER FIB FLEXIVA PULSE ID 365 (Laser) IMPLANT
MANIFOLD NEPTUNE II (INSTRUMENTS) ×2 IMPLANT
PACK CYSTO (CUSTOM PROCEDURE TRAY) ×2 IMPLANT
SHEATH NAVIGATOR HD 11/13X28 (SHEATH) IMPLANT
SHEATH NAVIGATOR HD 11/13X36 (SHEATH) IMPLANT
STENT POLARIS 5FRX26 (STENTS) ×1 IMPLANT
TRACTIP FLEXIVA PULS ID 200XHI (Laser) IMPLANT
TRACTIP FLEXIVA PULSE ID 200 (Laser)
TUBE FEEDING 8FR 16IN STR KANG (MISCELLANEOUS) ×2 IMPLANT
TUBING CONNECTING 10 (TUBING) ×2 IMPLANT
TUBING UROLOGY SET (TUBING) ×2 IMPLANT

## 2021-12-30 NOTE — Discharge Instructions (Addendum)
1 - You may have urinary urgency (bladder spasms) and bloody urine on / off with stent in place. This is normal.  2 - Remove tethered stent on Thursday morning at home by pulling on string, then blue-white plastic tubing and discarding. Office is open Thursday if any issues arise.   3 - Call MD or go to ER for fever >102, severe pain / nausea / vomiting not relieved by medications, or acute change in medical status

## 2021-12-30 NOTE — ED Provider Notes (Addendum)
[facility] EMERGENCY DEPT Provider Note   CSN: [id number] Arrival date & time: 12/30/21  2423     History  Chief Complaint  Patient presents with   Abdominal Pain    Danny Brewer is a 51 y.o. male.   Abdominal Pain   51 year old male with medical history significant for HTN, CAD status post CABG, BPH, ED, nephrolithiasis, DM 2 who presents to the emergency department with abdominal pain.  The patient states that he woke up this morning with severe abdominal pain that is described as sharp and tearing and radiates to his back.  He has had 3 episodes of NBNB emesis.  He denies any chest pain but does endorse mild shortness of breath associated with the pain.  He denies any diarrhea.  He denies any fevers or chills.  He denies any dysuria or increased urinary frequency.  He is passing gas.  Home Medications Prior to Admission medications   Medication Sig Start Date End Date Taking? Authorizing Provider  acetaminophen (TYLENOL) 500 MG tablet Take 2 tablets (1,000 mg total) by mouth every 6 (six) hours. 07/08/21   Marcine Matar, MD  Alirocumab (PRALUENT) 75 MG/ML SOAJ INJECT 1 ML INTO THE SKIN EVERY 14 (FOURTEEN) DAYS. 02/24/21   Tonny Bollman, MD  amLODipine (NORVASC) 5 MG tablet Take 1 tablet (5 mg total) by mouth daily. 05/26/21 08/24/21  Tonny Bollman, MD  aspirin 81 MG tablet Take 1 tablet (81 mg total) by mouth daily. 10/11/18   Tereso Newcomer T, PA-C  ezetimibe (ZETIA) 10 MG tablet Take 1 tablet (10 mg total) by mouth daily. 05/26/21   Tonny Bollman, MD  HYDROcodone-acetaminophen (NORCO) 5-325 MG tablet Take 1-2 tablets by mouth every 6 (six) hours as needed. 12/24/21   Geoffery Lyons, MD  sitaGLIPtin (JANUVIA) 100 MG tablet Take 100 mg by mouth daily.    [provider]  tamsulosin (FLOMAX) 0.4 MG CAPS capsule Take 0.4 mg by mouth at bedtime.    [provider]      Allergies    Antihistamines, diphenhydramine-type; Bactrim  [sulfamethoxazole-trimethoprim]; Demerol; Morphine and related; Compazine [prochlorperazine]; Metoprolol; and Statins    Review of Systems   Review of Systems  Gastrointestinal:  Positive for abdominal pain.  All other systems reviewed and are negative.   Physical Exam Updated Vital Signs BP 131/85   Pulse 88   Temp 97.7 F (36.5 C) (Oral)   Resp 16   Ht 5\' 9"  (1.753 m)   Wt 86.2 kg   SpO2 100%   BMI 28.06 kg/m  Physical Exam Vitals and nursing note reviewed.  Constitutional:      General: He is in acute distress.     Appearance: He is well-developed.  HENT:     Head: Normocephalic and atraumatic.  Eyes:     Conjunctiva/sclera: Conjunctivae normal.  Cardiovascular:     Rate and Rhythm: Normal rate and regular rhythm.     Heart sounds: No murmur heard. Pulmonary:     Effort: Pulmonary effort is normal. No respiratory distress.     Breath sounds: Normal breath sounds.  Abdominal:     Palpations: Abdomen is soft.     Tenderness: There is generalized abdominal tenderness. There is guarding and rebound.     Comments: Unable to assess CVA tenderness, patient writhing in pain, complaining of sharp pain radiating to his back, guarding present  Musculoskeletal:        General: No swelling.     Cervical back: Neck supple.  Skin:  General: Skin is warm and dry.     Capillary Refill: Capillary refill takes less than 2 seconds.  Neurological:     Mental Status: He is alert.  Psychiatric:        Mood and Affect: Mood normal.     ED Results / Procedures / Treatments   Labs (all labs ordered are listed, but only abnormal results are displayed) Labs Reviewed  LIPASE, BLOOD - Abnormal; Notable for the following components:      Result Value   Lipase 10 (*)    All other components within normal limits  COMPREHENSIVE METABOLIC PANEL - Abnormal; Notable for the following components:   Glucose, Bld 146 (*)    Creatinine, Ser 1.35 (*)    All other components within normal  limits  URINALYSIS, ROUTINE W REFLEX MICROSCOPIC - Abnormal; Notable for the following components:   Specific Gravity, Urine >1.046 (*)    Hgb urine dipstick MODERATE (*)    All other components within normal limits  CBC  LACTIC ACID, PLASMA  TROPONIN I (HIGH SENSITIVITY)    EKG EKG Interpretation  Date/Time:  Tuesday December 30 2021 09:21:30 EDT Ventricular Rate:  71 PR Interval:  138 QRS Duration: 97 QT Interval:  386 QTC Calculation: 420 R Axis:   60 Text Interpretation: Sinus rhythm RSR' in V1 or V2, right VCD or RVH ST elevation, consider inferior injury new compared to previous EKGs Confirmed by Ernie Avena ([id number]) on 12/30/2021 10:03:44 AM  Radiology CT Angio Chest/Abd/Pel for Dissection W and/or Wo Contrast  Result Date: 12/30/2021 CLINICAL DATA:  Abdominal pain radiating to the lower back and emesis. EXAM: CT ANGIOGRAPHY CHEST, ABDOMEN AND PELVIS TECHNIQUE: CT abdomen/pelvis 12/24/2021 Multidetector CT imaging through the chest, abdomen and pelvis was performed using the standard protocol during bolus administration of intravenous contrast. Multiplanar reconstructed images and MIPs were obtained and reviewed to evaluate the vascular anatomy. RADIATION DOSE REDUCTION: This exam was performed according to the departmental dose-optimization program which includes automated exposure control, adjustment of the mA and/or kV according to patient size and/or use of iterative reconstruction technique. CONTRAST:  13mL OMNIPAQUE IOHEXOL 350 MG/ML SOLN COMPARISON:  CTA chest 05/11/2021, CT abdomen/pelvis 12/25/2018 FINDINGS: CTA CHEST FINDINGS Cardiovascular: There is no evidence of acute intramural hematoma on the initial noncontrast study. There is no evidence of thoracic aortic dissection or aneurysm there is no evidence of pulmonary embolism. Mediastinum/Nodes: Thyroid is unremarkable. The esophagus is grossly unremarkable. There is no mediastinal, hilar, or axillary lymphadenopathy. The  patient is status post prior CABG. Lungs/Pleura: Trachea and central airways are patent. There is no focal consolidation or pulmonary edema. There is no pleural effusion or pneumothorax. There is no suspicious nodule. Musculoskeletal: There is no acute osseous abnormality or suspicious osseous lesion. Median sternotomy wires are noted. Review of the MIP images confirms the above findings. CTA ABDOMEN AND PELVIS FINDINGS VASCULAR Aorta: Normal caliber aorta without aneurysm, dissection, vasculitis or significant stenosis. Celiac: Patent without evidence of aneurysm, dissection, vasculitis or significant stenosis. SMA: Patent without evidence of aneurysm, dissection, vasculitis or significant stenosis. Renals: Both renal arteries are patent without evidence of aneurysm, dissection, vasculitis, fibromuscular dysplasia or significant stenosis. IMA: Patent without evidence of aneurysm, dissection, vasculitis or significant stenosis. Inflow: Patent without evidence of aneurysm, dissection, vasculitis or significant stenosis. Veins: No obvious venous abnormality within the limitations of this arterial phase study. Review of the MIP images confirms the above findings. NON-VASCULAR Hepatobiliary: The liver and gallbladder are unremarkable. There is no biliary  ductal dilatation. Pancreas: Unremarkable. Spleen: Unremarkable. Adrenals/Urinary Tract: The adrenals are unremarkable. There is a 3-4 mm stone at the right UVJ with mild upstream hydroureteronephrosis. There is perinephric fluid around the right lower pole. There is symmetric enhancement of both kidneys. There is an additional punctate nonobstructing intrarenal stone on the right. There is a 4 mm nonobstructing left upper pole renal stone. There are no focal parenchymal lesions. There is no left hydronephrosis or hydroureter. The bladder is unremarkable. Stomach/Bowel: Stomach is unremarkable. There is no evidence of bowel obstruction. There is no abnormal bowel wall  thickening or inflammatory change. Lymphatic: There is no abdominal or pelvic lymphadenopathy. Reproductive: The prostate and seminal vesicles are unremarkable. Other: A penile prosthesis is noted with a reservoir in the left lower quadrant, unchanged. Musculoskeletal: There is no acute osseous abnormality or suspicious osseous lesion. Review of the MIP images confirms the above findings. IMPRESSION: 1. No evidence of aortic dissection or aneurysm. 2. 3-4 mm obstructing stone at the right UVJ with mild upstream hydroureteronephrosis and perinephric fluid. 3. Additional nonobstructing bilateral renal stones. Electronically Signed   By: Lesia Hausen M.D.   On: 12/30/2021 11:14    Procedures Procedures    Medications Ordered in ED Medications  lidocaine (LIDODERM) 5 % 1 patch (1 patch Transdermal Patch Applied 12/30/21 1247)  lactated ringers infusion (has no administration in time range)  HYDROmorphone (DILAUDID) injection 1 mg (1 mg Intravenous Given 12/30/21 1012)  ondansetron (ZOFRAN) injection 4 mg (4 mg Intravenous Given 12/30/21 1010)  iohexol (OMNIPAQUE) 350 MG/ML injection 100 mL (85 mLs Intravenous Contrast Given 12/30/21 1046)  ketorolac (TORADOL) 15 MG/ML injection 15 mg (15 mg Intravenous Given 12/30/21 1125)  lactated ringers bolus 1,000 mL (0 mLs Intravenous Stopped 12/30/21 1246)  HYDROmorphone (DILAUDID) injection 1 mg (1 mg Intravenous Given 12/30/21 1241)  tamsulosin (FLOMAX) capsule 0.4 mg (0.4 mg Oral Given 12/30/21 1240)    ED Course/ Medical Decision Making/ A&P Clinical Course as of 12/30/21 1328  Tue Dec 30, 2021  1307 Hgb urine dipstickMarland Kitchen): MODERATE [JL]    Clinical Course User Index [JL] Ernie Avena, MD                           Medical Decision Making Amount and/or Complexity of Data Reviewed Labs: ordered. Decision-making details documented in ED Course. Radiology: ordered.  Risk Prescription drug management.    51 year old male with medical history  significant for HTN, CAD status post CABG, BPH, ED, nephrolithiasis, DM 2 who presents to the emergency department with abdominal pain.  The patient states that he woke up this morning with severe abdominal pain that is described as sharp and tearing and radiates to his back.  He has had 3 episodes of NBNB emesis.  He denies any chest pain but does endorse mild shortness of breath associated with the pain.  He denies any diarrhea.  He denies any fevers or chills.  He denies any dysuria or increased urinary frequency.  He is passing gas.  On arrival, the patient was afebrile, not tachycardic or tachypneic, hypertensive BP 164/100, saturating well on room air.  Physical exam significant for an abdomen that was tender to palpation diffusely with guarding present, mild rebound tenderness present.  Differential diagnosis includes aortic dissection, mesenteric ischemia, small bowel obstruction, perforated diverticulitis, nephrolithiasis, pyelonephritis, appendicitis.  IV access was obtained and the patient was administered IV antiemetics, and IV pain medicine.  An EKG was performed which revealed no  acute ischemic changes.  CTA imaging was performed to evaluate for aortic dissection which resulted negative.  It did show an obstructing right 3 mm UVJ stone.  The patient was administered IV Toradol and an IV fluid bolus for further pain control.  He does not have an AKI, is not septic and has urinalysis without evidence of UTI.  A troponin was also collected and resulted negative a lipase was normal and a CBC was without a leukocytosis or anemia.  On repeat assessment, the patient continued to endorse pain and spasm in his back.  I spoke with on-call urology, Dr. Urban Gibson who recommended further attempts at pain control and if patient's pain was unable to be controlled, continue to have the patient be n.p.o., transfer to [facility] for further management.  Will order an additional milligram of Dilaudid and  reassess.  On repeat assessment, the patient continues to endorse pain following 2 rounds of Dilaudid, IV Toradol, IV fluids.  We will start the patient on an LR infusion and transfer ED to ED to [facility] for evaluation by urology in person.  Currently contacted for transfer.  Dr. Rhunette Croft at [facility] the patient in ED to ED transfer.  Stable at time of transfer on reassessment.  EMTALA completed.    Final Clinical Impression(s) / ED Diagnoses Final diagnoses:  Ureterolithiasis    Rx / DC Orders ED Discharge Orders          Ordered    Ambulatory referral to Urology        12/30/21 1328              Ernie Avena, MD 12/30/21 1328    Ernie Avena, MD 12/30/21 1329

## 2021-12-30 NOTE — Op Note (Signed)
Danny Brewer, Danny Brewer MEDICAL RECORD NO: [id number] ACCOUNT NO: [id number] DATE OF BIRTH: 11-28-1970 FACILITY: [facility] LOCATION: [facility] PHYSICIAN: Sebastian Ache, MD  Operative Report   DATE OF PROCEDURE: 12/30/2021  PREOPERATIVE DIAGNOSES:  Right ureteral stone, refractory colic.  PROCEDURES PERFORMED:   1.  Cystoscopy, right retrograde pyelogram and interpretation. 2.  Right ureteroscopy with basketing of stone. 3.  Insertion of right ureteral stent.  ESTIMATED BLOOD LOSS:  Nil.  COMPLICATIONS:  None.  SPECIMEN:  Right ureteral stone for composition analysis.  FINDINGS:   1.  Right distal ureteral stone retrograde positioned to kidney. 2.  Complete resolution of all accessible stone fragments in the right kidney and ureter following basket extraction. 3.  Successful placement of right ureteral stent, proximal end in the renal pelvis, distal end in urinary bladder with tether.  INDICATIONS:  The patient is a pleasant 51 year old man with history of cardiovascular disease, status post open heart surgery; severe erectile dysfunction, status post penile prosthesis; and recurrent urolithiasis.  He has been able to pass his previous  stones medically. He has had approximately a week of increasing right colicky flank pain, and was found on axial imaging earlier today to have a right distal ureteral stone.  His colic was essentially impossible to control medically.  He was transferred  for consideration of further management.  Options were discussed including medical therapy, specifically with a high likelihood of interval passage versus shockwave lithotripsy versus ureteroscopy and he adamantly wished to proceed with the latter in  the semi-urgent setting.  Informed consent was obtained and placed in medical record.  PROCEDURE DETAIL:  The patient being himself verified, procedure being right ureteroscopic stone manipulation was confirmed.  Procedure timeout was performed.  Intravenous  antibiotics were administered.  General anesthesia was induced. The patient placed  into a low lithotomy position.  Sterile field was created, prepped and draped the patient's penis, perineum, and proximal thighs using iodine.  His in situ penile prosthesis was maximally deflated.  Cystourethroscopy was performed using 21-French rigid  cystoscope with offset lens.  Inspection of anterior and posterior urethra were unremarkable.  There was some resistance to overcoming the bladder neck due to his prosthesis, but not severe.  The bladder was unremarkable.  Ureteral orifices appeared  singleton.  The right ureteral orifice was cannulated with 6-French end-hole catheter, and right retrograde pyelogram was obtained.  Right retrograde pyelogram demonstrated single right ureter, single system right kidney.  There was minimal hydronephrosis with a questionable mobile filling defect in distal ureter.  A 0.038 ZIPwire was advanced to the level of the upper pole, set aside  as a safety wire.  An 8-French feeding tube placed in the urinary bladder for pressure release.  Semirigid ureteroscopy was performed of the distal four-fifths of the right ureter alongside a separate sensor working wire.  Again, due to the angulation  from his prosthesis, I did not feel comfortable performing semirigid ureteroscopy above the distal quarter of the ureter due to excessive torqueing in this location, still the stone in question was not visualized. As such, the semirigid scope was  exchanged over a sensor working wire using a train-track technique and fluoroscopy for a single channel digital ureteroscope, which was passed to the level of the kidney.  Panendoscopic examination of the kidney did reveal the stone in question having  been retrograde positioned into a midpole calyx.  There were no additional stones in it whatsoever.  This was amenable to simple basketing.  It was grasped with  the Escape basket and carefully navigated out  in its entirety, set aside for composition  analysis.  There were no additional foci of stone noted whatsoever within this kidney and ureter with ureteroscopy. Given the significant colic with relatively small stones, it was felt that he would be high risk of obstruction from ureteral edema.  As  such, decision was made to place a tethered stent and a new 5 x 26 Polaris type stent was placed over the safety wire using fluoroscopic guidance.  Good proximal and distal planes were noted.  Tether was left in place and fashioned to the penis on the  dorsum and the procedure was terminated.  The patient tolerated the procedure well, no immediate periprocedural complications.  The patient was taken to Postanesthesia Care Unit in stable condition with plan for discharge home.     PAA D: 12/30/2021 6:31:43 pm T: 12/30/2021 10:15:00 pm  JOB: [id number]/ [id number]

## 2021-12-30 NOTE — Transfer of Care (Signed)
Immediate Anesthesia Transfer of Care Note  Patient: Danny Brewer  Procedure(s) Performed: CYSTOSCOPY WITH RETROGRADE PYELOGRAM, URETEROSCOPY AND STENT PLACEMENT, BASKET OF STONES (Right)  Patient Location: PACU  Anesthesia Type:General  Level of Consciousness: drowsy  Airway & Oxygen Therapy: Patient Spontanous Breathing and Patient connected to face mask oxygen  Post-op Assessment: Report given to RN and Post -op Vital signs reviewed and stable  Post vital signs: Reviewed and stable  Last Vitals:  Vitals Value Taken Time  BP 148/114 12/30/21 1845  Temp    Pulse 93 12/30/21 1846  Resp 9 12/30/21 1846  SpO2 94 % 12/30/21 1846  Vitals shown include unvalidated device data.  Last Pain:  Vitals:   12/30/21 1728  TempSrc: Oral  PainSc: 0-No pain         Complications: No notable events documented.

## 2021-12-30 NOTE — ED Provider Notes (Signed)
  Physical Exam  BP 119/74   Pulse 64   Temp 97.8 F (36.6 C) (Oral)   Resp 14   Ht 5\' 9"  (1.753 m)   Wt 86.2 kg   SpO2 99%   BMI 28.06 kg/m   Physical Exam  Procedures  Procedures  ED Course / MDM   Clinical Course as of 12/30/21 1617  Tue Dec 30, 2021  1307 Hgb urine dipstickJul 27, 2023): MODERATE [JL]    Clinical Course User Index [JL] Marland Kitchen, MD   Medical Decision Making Amount and/or Complexity of Data Reviewed Labs: ordered. Decision-making details documented in ED Course. Radiology: ordered.  Risk Prescription drug management.   51yo male presents from [facility] with intractable pain related to nephrolithiasis.  Transferred for urologic evaluation. Uncomfortable at time of my evaluation. Ordered additional pain medication and discussed with Dr. 51yo Urology.        Berneice Heinrich, MD 12/30/21 [phone]

## 2021-12-30 NOTE — ED Notes (Signed)
Back from CT

## 2021-12-30 NOTE — ED Triage Notes (Signed)
Pt from c/o of abdominal pain that radiates to the right lower back. Pt has had 3 episodes of emesis.

## 2021-12-30 NOTE — ED Notes (Signed)
Patient transported to CT 

## 2021-12-30 NOTE — Brief Op Note (Signed)
12/30/2021  6:26 PM  PATIENT:  Danny Brewer  51 y.o. male  PRE-OPERATIVE DIAGNOSIS:  RIGHT Ureteral Stone  POST-OPERATIVE DIAGNOSIS:  right ureteral stone  PROCEDURE:  Procedure(s): CYSTOSCOPY WITH RETROGRADE PYELOGRAM, URETEROSCOPY AND STENT PLACEMENT, BASKET OF STONES (Right)  SURGEON:  Surgeon(s) and Role:    * Sebastian Ache, MD - Primary  PHYSICIAN ASSISTANT:   ASSISTANTS: none   ANESTHESIA:   general  EBL:  minimal   BLOOD ADMINISTERED:none  DRAINS: none   LOCAL MEDICATIONS USED:  NONE  SPECIMEN:  Source of Specimen:  Rt ureteral stone  DISPOSITION OF SPECIMEN:   [facility] for compositional analysis  COUNTS:  YES  TOURNIQUET:  * No tourniquets in log *  DICTATION: .Other Dictation: Dictation Number [id number]  PLAN OF CARE: Discharge to home after PACU  PATIENT DISPOSITION:  PACU - hemodynamically stable.   Delay start of Pharmacological VTE agent (>24hrs) due to surgical blood loss or risk of bleeding: no

## 2021-12-30 NOTE — Anesthesia Procedure Notes (Signed)
Procedure Name: LMA Insertion Date/Time: 12/30/2021 5:46 PM  Performed by: Ezekiel Ina, CRNAPre-anesthesia Checklist: Patient identified, Emergency Drugs available, Suction available and Patient being monitored Patient Re-evaluated:Patient Re-evaluated prior to induction Oxygen Delivery Method: Circle system utilized Preoxygenation: Pre-oxygenation with 100% oxygen Induction Type: IV induction Ventilation: Mask ventilation without difficulty LMA: LMA with gastric port inserted LMA Size: 4.0 Number of attempts: 1 Tube secured with: Tape Dental Injury: Teeth and Oropharynx as per pre-operative assessment

## 2021-12-30 NOTE — H&P (Signed)
Danny Brewer is an 51 y.o. male.    Chief Complaint: RIGHT ureteral stone  HPI:   1- Recurrent Urolithiasis -  Pre 2023 - medical passage x severel 12/2021 - Rt 101m UVJ stone with mild hydro by ER CT. Punctate left renal stone as well (non-obstructing)  2 - Severe Erectile Dysfunction - s/p penile prosthesis by Dahlstedt.  PMH sig for CAD/CABG (follows Dr. Excell Seltzer cards, no blood thinners, minimal limitations) Bilateral lap ingunal hernia repair with mesh, ortho surgery. His PCP is Leodis Sias MD.  Today "Danny Brewer" is seen for evaluation of distal ureeral stoen. Reports a week of worseneing flank/groin pain, now 9/10 and med-refracotyr. UA without infectious parameters. Cr 1.35. Last meal 4pm yesterday.  Past Medical History:  Diagnosis Date   BPH (benign prostatic hyperplasia)    Carotid artery disease (HCC)    [place] 5/22: Bilat 1-39    Complication of anesthesia    Pt verble and "prophecizes" after anesthesia   Coronary Artery Disease cardiologist--- dr cooper   Coronary CTA 09/2018: pLAD 70-90; pD1+pD2 70-90; mRCA mixed plaque - cannot rule out 70-90; FFR suggests hemodynamically significant stenosis // LHC 10/2018: 2v CAD >> s/p  CABG (L-LAD, L radial-RI, S-D2, S-RCA) with Dr. 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   LIMA--LAD;  SVG--D2;  SVG--dRCA;  left radial --- RI   Type 2 diabetes mellitus (HCC)    followed by pcp   (10-01-2020 checks blood sugar 3 times wkly,  fasting sugar-- 72--105)   Wears glasses     Past Surgical History:  Procedure Laterality Date   CORONARY ARTERY BYPASS GRAFT N/A 10/17/2018   Procedure: CORONARY ARTERY BYPASS GRAFTING (CABG), ON PUMP, TIMES FOUR, USING LIMA TO LAD, ENDOSCOPICALLY HARVESTED GREATER SAPHENOUS VEIN TO DIAG 2, SVG TO DISTAL RCA, AND LEFT RADIAL TO RAMUS INTERMEDIUS;  Surgeon:  [name], MD;  Location: [facility] OR;  Service: Open Heart Surgery;  Laterality: N/A;   ENDOVEIN HARVEST OF GREATER SAPHENOUS VEIN Right 10/17/2018   Procedure: Mack Guise Of Greater Saphenous Vein;  Surgeon: [name], MD;  Location: [facility] OR;  Service: Open Heart Surgery;  Laterality: Right;   HAND SURGERY Left 1987   complex laceration   INGUINAL HERNIA REPAIR Bilateral 05/27/2015   Procedure: LAPAROSCOPIC BILATERAL  INGUINAL HERNIA REPAIRS WITH MESH;  Surgeon: Karie Soda, MD;  Location: [facility] ORS;  Service: General;  Laterality: Bilateral;   KNEE ARTHROSCOPY Left 1991   LEFT HEART CATH AND CORONARY ANGIOGRAPHY N/A 10/14/2018   Procedure: LEFT HEART CATH AND CORONARY ANGIOGRAPHY;  Surgeon: Lyn Records, MD;  Location: [facility] INVAS[facility];  Service: Cardiovascular;  Laterality: N/A;   PENILE PROSTHESIS IMPLANT N/A 10/03/2020   Procedure: PLACEMENT OF PENILE PROTHESIS INFLATABLE;  Surgeon: Marcine Matar, MD;  Location: [facility];  Service: Urology;  Laterality: N/A;  pubis area   RADIAL ARTERY HARVEST Left 10/17/2018   Procedure: RADIAL ARTERY HARVEST;  Surgeon: [name], MD;  Location: [facility] OR;  Service: Open Heart Surgery;  Laterality: Left;   REMOVAL OF PENILE PROSTHESIS N/A 07/07/2021   Procedure: EXPLANT  AND REPLACEMENT OF COLOPLAST TITAN PENILE PROSTHESIS;  Surgeon: Marcine Matar, MD;  Location: [facility];  Service: Urology;  Laterality: N/A;  SCROTAL EXPLORATION N/A 07/07/2021   Procedure: EXPLORATION OF PENILE PROSTHESIS;  Surgeon: Marcine Matar, MD;  Location: [facility];  Service: Urology;  Laterality: N/A;   SHOULDER CLOSED REDUCTION Left 09/04/2019   Procedure: CLOSED MANIPULATION SHOULDER;  Surgeon: Dannielle Huh, MD;  Location: [facility];  Service: Orthopedics;  Laterality: Left;   SHOULDER SURGERY Left 2022   TEE WITHOUT CARDIOVERSION N/A 10/17/2018   Procedure: TRANSESOPHAGEAL ECHOCARDIOGRAM (TEE);  Surgeon: [name], MD;   Location: [facility] OR;  Service: Open Heart Surgery;  Laterality: N/A;    Family History  Problem Relation Age of Onset   Hypertension Mother    Cancer Father    Heart failure Father    Sudden death Brother    Heart attack Brother    Hypertension Sister    Heart murmur Sister    Social History:  reports that he has never smoked. He has never used smokeless tobacco. He reports that he does not drink alcohol and does not use drugs.  Allergies:  Allergies  Allergen Reactions   Antihistamines, Diphenhydramine-Type Hives and Other (See Comments)    Seizures   Bactrim [Sulfamethoxazole-Trimethoprim] Itching    Throat itching and suspected pill esophagitis   Demerol Hives   Morphine And Related Hives    Esp with high dose morphine, possibly Dilaudid Tolerates hydrocodone, oxycodone   Compazine [Prochlorperazine] Other (See Comments)    Spastic movement   Metoprolol     Per patient caused "severe headache"   Statins Other (See Comments)    myalgias    (Not in a hospital admission)   Results for orders placed or performed during the hospital encounter of 12/30/21 (from the past 48 hour(s))  Lipase, blood     Status: Abnormal   Collection Time: 12/30/21  9:22 AM  Result Value Ref Range   Lipase 10 (L) 11 - 51 U/L    Comment: Performed at [facility], [address], [place], [place] [address]  Comprehensive metabolic panel     Status: Abnormal   Collection Time: 12/30/21  9:22 AM  Result Value Ref Range   Sodium 142 135 - 145 mmol/L   Potassium 3.8 3.5 - 5.1 mmol/L   Chloride 105 98 - 111 mmol/L   CO2 25 22 - 32 mmol/L   Glucose, Bld 146 (H) 70 - 99 mg/dL    Comment: Glucose reference range applies only to samples taken after fasting for at least 8 hours.   BUN 14 6 - 20 mg/dL   Creatinine, Ser 2.97 (H) 0.61 - 1.24 mg/dL   Calcium 8.9 8.9 - 98.9 mg/dL   Total Protein 6.9 6.5 - 8.1 g/dL   Albumin 4.3 3.5 - 5.0 g/dL   AST 16 15 - 41 U/L   ALT 12 0 - 44 U/L    Alkaline Phosphatase 55 38 - 126 U/L   Total Bilirubin 0.4 0.3 - 1.2 mg/dL   GFR, Estimated >21 >19 mL/min    Comment: (NOTE) Calculated using the CKD-EPI Creatinine Equation (2021)    Anion gap 12 5 - 15    Comment: Performed at [facility], [address], [place], [place] [address]  CBC     Status: None   Collection Time: 12/30/21  9:22 AM  Result Value Ref Range   WBC 6.7 4.0 - 10.5 K/uL   RBC 5.17 4.22 - 5.81 MIL/uL   Hemoglobin 14.3 13.0 - 17.0 g/dL   HCT 81.4 48.1 - 85.6 %   MCV 87.2  80.0 - 100.0 fL   MCH 27.7 26.0 - 34.0 pg   MCHC 31.7 30.0 - 36.0 g/dL   RDW 35.7 01.7 - 79.3 %   Platelets 221 150 - 400 K/uL   nRBC 0.0 0.0 - 0.2 %    Comment: Performed at [facility], [address] [place], [place] [address]  Urinalysis, Routine w reflex microscopic     Status: Abnormal   Collection Time: 12/30/21  9:22 AM  Result Value Ref Range   Color, Urine YELLOW YELLOW   APPearance CLEAR CLEAR   Specific Gravity, Urine >1.046 (H) 1.005 - 1.030   pH 7.0 5.0 - 8.0   Glucose, UA NEGATIVE NEGATIVE mg/dL   Hgb urine dipstick MODERATE (A) NEGATIVE   Bilirubin Urine NEGATIVE NEGATIVE   Ketones, ur NEGATIVE NEGATIVE mg/dL   Protein, ur NEGATIVE NEGATIVE mg/dL   Nitrite NEGATIVE NEGATIVE   Leukocytes,Ua NEGATIVE NEGATIVE   RBC / HPF 6-10 0 - 5 RBC/hpf   WBC, UA 0-5 0 - 5 WBC/hpf   Mucus PRESENT     Comment: Performed at [facility], [address], [place], [place] [address]  Troponin I (High Sensitivity)     Status: None   Collection Time: 12/30/21  9:22 AM  Result Value Ref Range   Troponin I (High Sensitivity) <2 <18 ng/L    Comment: (NOTE) Elevated high sensitivity troponin I (hsTnI) values and significant  changes across serial measurements may suggest ACS but many other  chronic and acute conditions are known to elevate hsTnI results.  Refer to the "Links" section for chest pain algorithms and additional   guidance. Performed at [facility], [address], [place], [place] [address]   Lactic acid, plasma     Status: None   Collection Time: 12/30/21 10:25 AM  Result Value Ref Range   Lactic Acid, Venous 1.6 0.5 - 1.9 mmol/L    Comment: Performed at [facility], [address] [place], [place] [address]  CBG monitoring, ED     Status: None   Collection Time: 12/30/21  3:15 PM  Result Value Ref Range   Glucose-Capillary 96 70 - 99 mg/dL    Comment: Glucose reference range applies only to samples taken after fasting for at least 8 hours.   CT Angio Chest/Abd/Pel for Dissection W and/or Wo Contrast  Result Date: 12/30/2021 CLINICAL DATA:  Abdominal pain radiating to the lower back and emesis. EXAM: CT ANGIOGRAPHY CHEST, ABDOMEN AND PELVIS TECHNIQUE: CT abdomen/pelvis 12/24/2021 Multidetector CT imaging through the chest, abdomen and pelvis was performed using the standard protocol during bolus administration of intravenous contrast. Multiplanar reconstructed images and MIPs were obtained and reviewed to evaluate the vascular anatomy. RADIATION DOSE REDUCTION: This exam was performed according to the departmental dose-optimization program which includes automated exposure control, adjustment of the mA and/or kV according to patient size and/or use of iterative reconstruction technique. CONTRAST:  61mL OMNIPAQUE IOHEXOL 350 MG/ML SOLN COMPARISON:  CTA chest 05/11/2021, CT abdomen/pelvis 12/25/2018 FINDINGS: CTA CHEST FINDINGS Cardiovascular: There is no evidence of acute intramural hematoma on the initial noncontrast study. There is no evidence of thoracic aortic dissection or aneurysm there is no evidence of pulmonary embolism. Mediastinum/Nodes: Thyroid is unremarkable. The esophagus is grossly unremarkable. There is no mediastinal, hilar, or axillary lymphadenopathy. The patient is status post prior CABG. Lungs/Pleura: Trachea and central airways are patent.  There is no focal consolidation or pulmonary edema. There is no pleural effusion or pneumothorax. There is  no suspicious nodule. Musculoskeletal: There is no acute osseous abnormality or suspicious osseous lesion. Median sternotomy wires are noted. Review of the MIP images confirms the above findings. CTA ABDOMEN AND PELVIS FINDINGS VASCULAR Aorta: Normal caliber aorta without aneurysm, dissection, vasculitis or significant stenosis. Celiac: Patent without evidence of aneurysm, dissection, vasculitis or significant stenosis. SMA: Patent without evidence of aneurysm, dissection, vasculitis or significant stenosis. Renals: Both renal arteries are patent without evidence of aneurysm, dissection, vasculitis, fibromuscular dysplasia or significant stenosis. IMA: Patent without evidence of aneurysm, dissection, vasculitis or significant stenosis. Inflow: Patent without evidence of aneurysm, dissection, vasculitis or significant stenosis. Veins: No obvious venous abnormality within the limitations of this arterial phase study. Review of the MIP images confirms the above findings. NON-VASCULAR Hepatobiliary: The liver and gallbladder are unremarkable. There is no biliary ductal dilatation. Pancreas: Unremarkable. Spleen: Unremarkable. Adrenals/Urinary Tract: The adrenals are unremarkable. There is a 3-4 mm stone at the right UVJ with mild upstream hydroureteronephrosis. There is perinephric fluid around the right lower pole. There is symmetric enhancement of both kidneys. There is an additional punctate nonobstructing intrarenal stone on the right. There is a 4 mm nonobstructing left upper pole renal stone. There are no focal parenchymal lesions. There is no left hydronephrosis or hydroureter. The bladder is unremarkable. Stomach/Bowel: Stomach is unremarkable. There is no evidence of bowel obstruction. There is no abnormal bowel wall thickening or inflammatory change. Lymphatic: There is no abdominal or pelvic  lymphadenopathy. Reproductive: The prostate and seminal vesicles are unremarkable. Other: A penile prosthesis is noted with a reservoir in the left lower quadrant, unchanged. Musculoskeletal: There is no acute osseous abnormality or suspicious osseous lesion. Review of the MIP images confirms the above findings. IMPRESSION: 1. No evidence of aortic dissection or aneurysm. 2. 3-4 mm obstructing stone at the right UVJ with mild upstream hydroureteronephrosis and perinephric fluid. 3. Additional nonobstructing bilateral renal stones. Electronically Signed   By: Lesia Hausen M.D.   On: 12/30/2021 11:14    Review of Systems  Constitutional:  Negative for chills and fever.  Genitourinary:  Positive for flank pain.  All other systems reviewed and are negative.   Blood pressure (!) 143/103, pulse 80, temperature 97.8 F (36.6 C), temperature source Oral, resp. rate 18, height 5\' 9"  (1.753 m), weight 86.2 kg, SpO2 98 %. Physical Exam Vitals reviewed.  Constitutional:      Comments: Pleasant in ER. Wife at bedside.   Cardiovascular:     Rate and Rhythm: Normal rate.  Pulmonary:     Effort: Pulmonary effort is normal.  Abdominal:     General: Abdomen is flat.  Genitourinary:    Comments: Penile prosthesis in situ without erosion. MOderate Rt CVAT.  Skin:    General: Skin is warm.  Neurological:     Mental Status: He is alert.  Psychiatric:        Mood and Affect: Mood normal.      Assessment/Plan  Discussed options of medical therapy (90% chance of eventual passage), ureteroscopy (most definitive but most invasive), or SWL (not favored given very distal location). He states his symptoms are med-refracotry and much worse than prior colic episodes he was able to manage medically and wants to proceed with ureteroscoy today. Risks, benefits, peri-op course discussed. Hopeful DC home for PACU as long as uneventful course.   , MD 12/30/2021, 4:46 PM

## 2021-12-30 NOTE — Anesthesia Preprocedure Evaluation (Signed)
Anesthesia Evaluation  Patient identified by MRN, date of birth, ID band Patient awake    Reviewed: Allergy & Precautions, H&P , NPO status , Patient's Chart, lab work & pertinent test results  Airway Mallampati: II   Neck ROM: full    Dental   Pulmonary sleep apnea ,    breath sounds clear to auscultation       Cardiovascular hypertension, + CAD and + CABG   Rhythm:regular Rate:Normal     Neuro/Psych    GI/Hepatic GERD  ,  Endo/Other  diabetes, Type 2  Renal/GU      Musculoskeletal  (+) Arthritis ,   Abdominal   Peds  Hematology   Anesthesia Other Findings   Reproductive/Obstetrics                             Anesthesia Physical Anesthesia Plan  ASA: 3  Anesthesia Plan: General   Post-op Pain Management:    Induction: Intravenous  PONV Risk Score and Plan: 2 and Ondansetron, Dexamethasone, Midazolam and Treatment may vary due to age or medical condition  Airway Management Planned: LMA  Additional Equipment:   Intra-op Plan:   Post-operative Plan: Extubation in OR  Informed Consent: I have reviewed the patients History and Physical, chart, labs and discussed the procedure including the risks, benefits and alternatives for the proposed anesthesia with the patient or authorized representative who has indicated his/her understanding and acceptance.     Dental advisory given  Plan Discussed with: CRNA, Anesthesiologist and Surgeon  Anesthesia Plan Comments:         Anesthesia Quick Evaluation

## 2021-12-31 ENCOUNTER — Encounter (HOSPITAL_COMMUNITY): Payer: Self-pay | Admitting: Urology

## 2022-01-01 NOTE — Anesthesia Postprocedure Evaluation (Signed)
Anesthesia Post Note  Patient: Danny Brewer  Procedure(s) Performed: CYSTOSCOPY WITH RETROGRADE PYELOGRAM, URETEROSCOPY AND STENT PLACEMENT, BASKET OF STONES (Right)     Patient location during evaluation: PACU Anesthesia Type: General Level of consciousness: awake and alert Pain management: pain level controlled Vital Signs Assessment: post-procedure vital signs reviewed and stable Respiratory status: spontaneous breathing, nonlabored ventilation, respiratory function stable and patient connected to nasal cannula oxygen Cardiovascular status: blood pressure returned to baseline and stable Postop Assessment: no apparent nausea or vomiting Anesthetic complications: no   No notable events documented.  Last Vitals:  Vitals:   12/30/21 1900 12/30/21 1915  BP: (!) 133/100 (!) 134/102  Pulse: 99 95  Resp: 20   Temp:    SpO2: 96% 99%    Last Pain:  Vitals:   12/30/21 1915  TempSrc:   PainSc: 0-No pain                 Laylaa Guevarra S

## 2022-01-14 ENCOUNTER — Encounter: Payer: Self-pay | Admitting: Cardiovascular Disease

## 2022-01-14 ENCOUNTER — Ambulatory Visit (INDEPENDENT_AMBULATORY_CARE_PROVIDER_SITE_OTHER): Payer: BC Managed Care – PPO | Admitting: Cardiovascular Disease

## 2022-01-14 VITALS — BP 118/62 | HR 88 | Ht 69.0 in | Wt 192.8 lb

## 2022-01-14 DIAGNOSIS — E782 Mixed hyperlipidemia: Secondary | ICD-10-CM

## 2022-01-14 DIAGNOSIS — I10 Essential (primary) hypertension: Secondary | ICD-10-CM | POA: Diagnosis not present

## 2022-01-14 DIAGNOSIS — E118 Type 2 diabetes mellitus with unspecified complications: Secondary | ICD-10-CM

## 2022-01-14 DIAGNOSIS — I25119 Atherosclerotic heart disease of native coronary artery with unspecified angina pectoris: Secondary | ICD-10-CM

## 2022-01-14 NOTE — Patient Instructions (Signed)
Medication Instructions:  Your physician recommends that you continue on your current medications as directed. Please refer to the Current Medication list given to you today.  *If you need a refill on your cardiac medications before your next appointment, please call your pharmacy*   Lab Work: NONE If you have labs (blood work) drawn today and your tests are completely normal, you will receive your results only by: MyChart Message (if you have MyChart) OR A paper copy in the mail If you have any lab test that is abnormal or we need to change your treatment, we will call you to review the results.   Testing/Procedures: NONE   Follow-Up: At [facility], you and your health needs are our priority.  As part of our continuing mission to provide you with exceptional heart care, we have created designated Provider Care Teams.  These Care Teams include your primary Cardiologist (physician) and Advanced Practice Providers (APPs -  Physician Assistants and Nurse Practitioners) who all work together to provide you with the care you need, when you need it.  Your next appointment:   6 month(s)  The format for your next appointment:   In Person  Provider:   Scott Weaver, PA-C     Then, Michael Cooper, MD will plan to see you again in 1 year(s).{    Important Information About Sugar       

## 2022-01-14 NOTE — Progress Notes (Signed)
Cardiology Office Note:    Date:  01/14/2022   ID:  Danny Brewer, DOB July 09, 1970, MRN [id number]  PCP:  Ileana Ladd, MD (Inactive)   [facility] Providers Cardiologist:  Tonny Bollman, MD Cardiology APP:  Kennon Rounds     Referring MD: Ileana Ladd, MD   Chief Complaint  Patient presents with   Coronary Artery Disease    History of Present Illness:    Danny Brewer is a 51 y.o. male with a hx of: Coronary artery disease  s/p CABG 10/2018 Echocardiogram 4/20: EF 60-65 Intol of beta-blocker  Diabetes mellitus 2 Hypertension  Hyperlipidemia  Intol of statins >> Rx w Alirocumab (Praluent) - followed by Dr Rennis Golden Carotid stenosis  [place] 10/2018, 10/2019: B/L ICA 1-39 ED  Kidney stones, ureteral stent 07/23  The patient is here alone today.  Reports that he is doing fairly well.  Feels like he is not able to do any hard physical work anymore.  If he does heavy lifting or bending forward with lifting, he experiences pain in the left side of the chest.  He is not really symptomatic with walking or aerobic activities and he does not feel any symptoms like he did before bypass surgery.  No orthopnea, PND, heart palpitations, or leg swelling.  The patient is compliant with his medical program.  His daughter is a Chief Strategy Officer and his son is a rising sophomore at [employer].  Past Medical History:  Diagnosis Date   BPH (benign prostatic hyperplasia)    Carotid artery disease (HCC)    [place] 5/22: Bilat 1-39    Complication of anesthesia    Pt verble and "prophecizes" after anesthesia   Coronary Artery Disease cardiologist--- dr Shahla Betsill   Coronary CTA 09/2018: pLAD 70-90; pD1+pD2 70-90; mRCA mixed plaque - cannot rule out 70-90; FFR suggests hemodynamically significant stenosis // LHC 10/2018: 2v CAD >> s/p  CABG (L-LAD, L radial-RI, S-D2, S-RCA) with Dr. 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   LIMA--LAD;  SVG--D2;  SVG--dRCA;  left radial --- RI   Type 2 diabetes mellitus (HCC)    followed by pcp   (10-01-2020 checks blood sugar 3 times wkly,  fasting sugar-- 72--105)   Wears glasses     Past Surgical History:  Procedure Laterality Date   CORONARY ARTERY BYPASS GRAFT N/A 10/17/2018   Procedure: CORONARY ARTERY BYPASS GRAFTING (CABG), ON PUMP, TIMES FOUR, USING LIMA TO LAD, ENDOSCOPICALLY HARVESTED GREATER SAPHENOUS VEIN TO DIAG 2, SVG TO DISTAL RCA, AND LEFT RADIAL TO RAMUS INTERMEDIUS;  Surgeon: [name], MD;  Location: [facility] OR;  Service: Open Heart Surgery;  Laterality: N/A;   CYSTOSCOPY WITH RETROGRADE PYELOGRAM, URETEROSCOPY AND STENT PLACEMENT Right 12/30/2021   Procedure: CYSTOSCOPY WITH RETROGRADE PYELOGRAM, URETEROSCOPY AND STENT PLACEMENT, BASKET OF STONES;  Surgeon: Sebastian Ache, MD;  Location: [facility];  Service: Urology;  Laterality: Right;   ENDOVEIN HARVEST OF GREATER SAPHENOUS VEIN Right 10/17/2018   Procedure: Mack Guise Of Greater Saphenous Vein;  Surgeon: [name], MD;  Location: [facility] OR;  Service: Open Heart Surgery;  Laterality: Right;   HAND SURGERY Left 1987   complex laceration   INGUINAL HERNIA REPAIR Bilateral 05/27/2015   Procedure: LAPAROSCOPIC BILATERAL  INGUINAL HERNIA REPAIRS WITH MESH;  Surgeon: Karie Soda, MD;  Location: [facility];  Service: General;  Laterality: Bilateral;   KNEE ARTHROSCOPY Left 1991   LEFT HEART CATH AND CORONARY ANGIOGRAPHY N/A 10/14/2018   Procedure: LEFT HEART CATH AND CORONARY ANGIOGRAPHY;  Surgeon: Lyn Records, MD;  Location: [facility] INV[facility];  Service: Cardiovascular;  Laterality: N/A;   PENILE PROSTHESIS IMPLANT N/A 10/03/2020   Procedure: PLACEMENT OF PENILE PROTHESIS INFLATABLE;  Surgeon: Marcine Matar, MD;  Location: [facility];  Service: Urology;  Laterality: N/A;  pubis area   RADIAL  ARTERY HARVEST Left 10/17/2018   Procedure: RADIAL ARTERY HARVEST;  Surgeon: [name], MD;  Location: [facility] OR;  Service: Open Heart Surgery;  Laterality: Left;   REMOVAL OF PENILE PROSTHESIS N/A 07/07/2021   Procedure: EXPLANT  AND REPLACEMENT OF COLOPLAST TITAN PENILE PROSTHESIS;  Surgeon: Marcine Matar, MD;  Location: [facility];  Service: Urology;  Laterality: N/A;   SCROTAL EXPLORATION N/A 07/07/2021   Procedure: EXPLORATION OF PENILE PROSTHESIS;  Surgeon: Marcine Matar, MD;  Location: [facility];  Service: Urology;  Laterality: N/A;   SHOULDER CLOSED REDUCTION Left 09/04/2019   Procedure: CLOSED MANIPULATION SHOULDER;  Surgeon: Dannielle Huh, MD;  Location: [facility];  Service: Orthopedics;  Laterality: Left;   SHOULDER SURGERY Left 2022   TEE WITHOUT CARDIOVERSION N/A 10/17/2018   Procedure: TRANSESOPHAGEAL ECHOCARDIOGRAM (TEE);  Surgeon: [name], MD;  Location: [facility] OR;  Service: Open Heart Surgery;  Laterality: N/A;    Current Medications: Current Meds  Medication Sig   acetaminophen (TYLENOL) 500 MG tablet Take 2 tablets (1,000 mg total) by mouth every 6 (six) hours.   Alirocumab (PRALUENT) 75 MG/ML SOAJ INJECT 1 ML INTO THE SKIN EVERY 14 (FOURTEEN) DAYS.   amLODipine (NORVASC) 5 MG tablet Take 1 tablet (5 mg total) by mouth daily.   aspirin 81 MG tablet Take 1 tablet (81 mg total) by mouth daily.   ezetimibe (ZETIA) 10 MG tablet Take 1 tablet (10 mg total) by mouth daily.   HYDROcodone-acetaminophen (NORCO) 5-325 MG tablet Take 1-2 tablets by mouth every 6 (six) hours as needed for severe pain (post-operatively).   ketorolac (TORADOL) 10 MG tablet Take 1 tablet (10 mg total) by mouth every 8 (eight) hours as needed for moderate pain (or stent discomfort post-operatively).   senna-docusate (SENOKOT-S) 8.6-50 MG tablet Take 1 tablet by mouth 2 (two) times daily. While taking stronger pain meds to prevent constipation   sitaGLIPtin (JANUVIA) 100 MG tablet Take 100 mg by  mouth daily.   tamsulosin (FLOMAX) 0.4 MG CAPS capsule Take 0.4 mg by mouth at bedtime.     Allergies:   Antihistamines, diphenhydramine-type; Bactrim [sulfamethoxazole-trimethoprim]; Demerol; Morphine and related; Compazine [prochlorperazine]; Metoprolol; and Statins   Social History   Socioeconomic History   Marital status: Married    Spouse name: Not on file   Number of children: Not on file   Years of education: 14   Highest education level: Not on file  Occupational History   Occupation: [employer]  Tobacco Use   Smoking status: Never   Smokeless tobacco: Never  Vaping Use   Vaping Use: Never used  Substance and Sexual Activity   Alcohol use: No   Drug use: Never   Sexual activity: Not on file  Other Topics Concern   Not on file  Social History Narrative   Right handed   Caffeine use: 1 tea/day   Social Determinants of Health  Financial Resource Strain: Not on file  Food Insecurity: Not on file  Transportation Needs: Not on file  Physical Activity: Not on file  Stress: Not on file  Social Connections: Not on file     Family History: The patient's family history includes Cancer in his father; Heart attack in his brother; Heart failure in his father; Heart murmur in his sister; Hypertension in his mother and sister; Sudden death in his brother.  ROS:   Please see the history of present illness.    All other systems reviewed and are negative.  EKGs/Labs/Other Studies Reviewed:    The following studies were reviewed today: Echo 09/19/2018:  1. The left ventricle has normal systolic function, with an ejection  fraction of 60-65%. The cavity size was normal. Left ventricular diastolic  function could not be evaluated.   2. The right ventricle has normal systolc function. The cavity was  normal. There is no increase in right ventricular wall thickness.   3. Aortic valve regurgitation was not assessed by color flow Doppler.   Cardiac Cath 10/14/2018: Diabetic  with family history of premature coronary atherosclerosis.  Atypical and typical symptoms.  Recurring episodes of chest tightness at rest which is mimicked by left and right coronary contrast injections.  Symptoms in arm and neck are more continuous, positional, and not likely ischemic. Severe two-vessel coronary disease with segmental 90 to 95% proximal to mid LAD, segmental 80% large first diagonal that supplies the distribution of the ramus intermedius or circumflex, segmental 80% stenosis in the moderate-sized second diagonal that is contained within the LAD stenosis as a bifurcation lesion, and segmental mid to distal.  The patient is right dominant.  Circumflex is small. Normal LVEDP   RECOMMENDATIONS:   In room consultation with [facility] CV surgeon Dr. Sheliah Plane. After discussion with patient, his wife, and surgeon, I have decided to admit the patient and he will undergo arterial grafting to the LAD, diagonal, and probable saphenous vein grafting to the right coronary on Monday.  Decided against PCI because of the requirement for long stents in all segments and anticipated decreased durability over time given his young age and diabetes. IV nitroglycerin, therapeutic Lovenox. Screening COVID testing. Sleep study as OP. Clinical history is strongly suggestive.  EKG:  EKG is not ordered today.    Recent Labs: 12/30/2021: ALT 12; BUN 14; Creatinine, Ser 1.35; Hemoglobin 14.3; Platelets 221; Potassium 3.8; Sodium 142  Recent Lipid Panel    Component Value Date/Time   CHOL 136 07/23/2021 0920   TRIG 50 07/23/2021 0920   HDL 56 07/23/2021 0920   CHOLHDL 2.4 07/23/2021 0920   CHOLHDL 4.3 09/19/2018 0243   VLDL 13 09/19/2018 0243   LDLCALC 69 07/23/2021 0920     Risk Assessment/Calculations:           Physical Exam:    VS:  BP 118/62   Pulse 88   Ht 5\' 9"  (1.753 m)   Wt 192 lb 12.8 oz (87.5 kg)   SpO2 97%   BMI 28.47 kg/m     Wt Readings from Last 3 Encounters:  01/14/22  192 lb 12.8 oz (87.5 kg)  12/30/21 193 lb (87.5 kg)  12/23/21 195 lb 5.2 oz (88.6 kg)     GEN:  Well nourished, well developed in no acute distress HEENT: Normal NECK: No JVD; No carotid bruits LYMPHATICS: No lymphadenopathy CARDIAC: RRR, no murmurs, rubs, gallops RESPIRATORY:  Clear to auscultation without rales, wheezing or rhonchi  ABDOMEN: Soft, non-tender,  non-distended MUSCULOSKELETAL:  No edema; No deformity  SKIN: Warm and dry NEUROLOGIC:  Alert and oriented x 3 PSYCHIATRIC:  Normal affect   ASSESSMENT:    1. Coronary artery disease involving native coronary artery of native heart with angina pectoris (HCC)   2. Mixed hyperlipidemia   3. Essential hypertension   4. Type 2 diabetes mellitus with complication, without long-term current use of insulin (HCC)    PLAN:    In order of problems listed above:  The patient appears clinically stable on aspirin 81 mg daily and amlodipine.  Will continue the same.  His chest pain symptoms appear to be more related to his prior sternotomy and may be chest wall discomfort.  He does not have typical angina with aerobic exercise like he had before bypass surgery.  Will continue his current medical program. Treated with Praluent and Zetia.  The patient is statin intolerant.  Last lipids show a cholesterol of 136, HDL 56, LDL 69.  Continue current therapy.  The patient maintains a healthy lifestyle. Blood pressure is well-controlled on amlodipine.  No changes made today. Treated with Januvia.  Hemoglobin A1c most recently is 6.3.     Medication Adjustments/Labs and Tests Ordered: Current medicines are reviewed at length with the patient today.  Concerns regarding medicines are outlined above.  No orders of the defined types were placed in this encounter.  No orders of the defined types were placed in this encounter.   Patient Instructions  Medication Instructions:  Your physician recommends that you continue on your current  medications as directed. Please refer to the Current Medication list given to you today.  *If you need a refill on your cardiac medications before your next appointment, please call your pharmacy*   Lab Work: NONE If you have labs (blood work) drawn today and your tests are completely normal, you will receive your results only by: MyChart Message (if you have MyChart) OR A paper copy in the mail If you have any lab test that is abnormal or we need to change your treatment, we will call you to review the results.   Testing/Procedures: NONE   Follow-Up: At [facility], you and your health needs are our priority.  As part of our continuing mission to provide you with exceptional heart care, we have created designated Provider Care Teams.  These Care Teams include your primary Cardiologist (physician) and Advanced Practice Providers (APPs -  Physician Assistants and Nurse Practitioners) who all work together to provide you with the care you need, when you need it.  Your next appointment:   6 month(s)  The format for your next appointment:   In Person  Provider:   Tereso Newcomer, PA-C     Then, Tonny Bollman, MD will plan to see you again in 1 year(s).     Important Information About Sugar         Signed, Tonny Bollman, MD  01/14/2022 11:55 AM    [facility]

## 2022-01-22 ENCOUNTER — Telehealth: Payer: Self-pay | Admitting: Neurology

## 2022-01-22 NOTE — Telephone Encounter (Signed)
Called [employer] and spoke with Sue Lush who states that the PA has to be completed on CMM or by a form. I initiated PA on CMM/[employer] [place]- using only BIN [id number] and completed the  [facility] Commercial Medical Benefit Electronic Request Form Will await determination

## 2022-02-04 MED ORDER — ONABOTULINUMTOXINA 100 UNITS IJ SOLR: 100.0000 [IU] | Freq: Once | INTRAMUSCULAR | 0 refills | Status: DC

## 2022-02-04 NOTE — Addendum Note (Signed)
Addended by: Judi Cong on: 02/04/2022 08:29 AM   Modules accepted: Orders

## 2022-02-04 NOTE — Telephone Encounter (Signed)
Called [facility] to advise that the PA is on file and approved for the procedure and the medication. Spoke with Alden Server and she states since the script was just sent this morning at 8:30 am they have not yet received it and started the process. She states that they will contact us once they have it ready and will discuss getting shipment set up. States that it can take up to 5 business days.  Advised I would wait on their call

## 2022-02-04 NOTE — Telephone Encounter (Signed)
PA approved for the patient for the botox and the procedure Effective from 01/22/2022 through 12/23/2022.

## 2022-02-12 ENCOUNTER — Other Ambulatory Visit: Payer: Self-pay | Admitting: *Deleted

## 2022-02-12 MED ORDER — ONABOTULINUMTOXINA 100 UNITS IJ SOLR: 100.0000 [IU] | INTRAMUSCULAR | 0 refills | Status: DC

## 2022-02-18 NOTE — Telephone Encounter (Signed)
Called [facility] pharmacy and spoke with Rosanne Ashing and he checked the status and on their end it was noted that there was prescription clarification needed. The script was sent on 02/12/22 so unsure why no one has reached out. Was transferred to pharmacy technician for clarification. I spoke with Maralyn Sago the pharmacy tech, she states the needed auth and I advised that I have auth on file. She transferred me to another department and spoke with [facility] and provided her with the authorization KEY and approval dates. She states that it will take up to 24 hours to process and then they will contact the pt to get consent to send to our office and then [facility] will call us to schedule.

## 2022-02-26 ENCOUNTER — Ambulatory Visit: Payer: BC Managed Care – PPO | Admitting: Neurology

## 2022-02-27 ENCOUNTER — Encounter (HOSPITAL_COMMUNITY): Payer: Self-pay | Admitting: *Deleted

## 2022-02-27 ENCOUNTER — Other Ambulatory Visit: Payer: Self-pay

## 2022-02-27 ENCOUNTER — Emergency Department (HOSPITAL_COMMUNITY): Payer: BC Managed Care – PPO

## 2022-02-27 ENCOUNTER — Emergency Department (HOSPITAL_COMMUNITY)
Admission: EM | Admit: 2022-02-27 | Discharge: 2022-02-27 | Disposition: A | Discharge status: Home / Self Care | Payer: BC Managed Care – PPO | Attending: Emergency Medicine | Admitting: Emergency Medicine

## 2022-02-27 ENCOUNTER — Ambulatory Visit (HOSPITAL_BASED_OUTPATIENT_CLINIC_OR_DEPARTMENT_OTHER)
Admission: RE | Admit: 2022-02-27 | Discharge: 2022-02-27 | Disposition: A | Discharge status: Home / Self Care | Payer: BC Managed Care – PPO | Source: Ambulatory Visit | Attending: Physician Assistant | Admitting: Physician Assistant

## 2022-02-27 ENCOUNTER — Other Ambulatory Visit (HOSPITAL_COMMUNITY): Payer: Self-pay | Admitting: Physician Assistant

## 2022-02-27 DIAGNOSIS — R079 Chest pain, unspecified: Secondary | ICD-10-CM | POA: Diagnosis present

## 2022-02-27 DIAGNOSIS — I2510 Atherosclerotic heart disease of native coronary artery without angina pectoris: Secondary | ICD-10-CM | POA: Insufficient documentation

## 2022-02-27 DIAGNOSIS — M79605 Pain in left leg: Secondary | ICD-10-CM

## 2022-02-27 DIAGNOSIS — M7989 Other specified soft tissue disorders: Secondary | ICD-10-CM

## 2022-02-27 DIAGNOSIS — Z955 Presence of coronary angioplasty implant and graft: Secondary | ICD-10-CM | POA: Diagnosis not present

## 2022-02-27 DIAGNOSIS — Z7982 Long term (current) use of aspirin: Secondary | ICD-10-CM | POA: Insufficient documentation

## 2022-02-27 LAB — CBC
HCT: 41.1 % (ref 39.0–52.0)
Hemoglobin: 13.1 g/dL (ref 13.0–17.0)
MCH: 28.2 pg (ref 26.0–34.0)
MCHC: 31.9 g/dL (ref 30.0–36.0)
MCV: 88.4 fL (ref 80.0–100.0)
Platelets: 282 10*3/uL (ref 150–400)
RBC: 4.65 MIL/uL (ref 4.22–5.81)
RDW: 13.2 % (ref 11.5–15.5)
WBC: 6 10*3/uL (ref 4.0–10.5)
nRBC: 0 % (ref 0.0–0.2)

## 2022-02-27 LAB — BASIC METABOLIC PANEL
Anion gap: 6 (ref 5–15)
BUN: 10 mg/dL (ref 6–20)
CO2: 26 mmol/L (ref 22–32)
Calcium: 8.6 mg/dL — ABNORMAL LOW (ref 8.9–10.3)
Chloride: 108 mmol/L (ref 98–111)
Creatinine, Ser: 1.12 mg/dL (ref 0.61–1.24)
GFR, Estimated: >60 mL/min (ref >60)
Glucose, Bld: 94 mg/dL (ref 70–99)
Potassium: 3.6 mmol/L (ref 3.5–5.1)
Sodium: 140 mmol/L (ref 135–145)

## 2022-02-27 LAB — TROPONIN I (HIGH SENSITIVITY)
Troponin I (High Sensitivity): 3 ng/L (ref <18)
Troponin I (High Sensitivity): 3 ng/L (ref <18)

## 2022-02-27 NOTE — ED Provider Notes (Signed)
[place] EMERGENCY DEPARTMENT Provider Note   CSN: [id number] Arrival date & time: 02/27/22  1606     History {Add pertinent medical, surgical, social history, OB history to HPI:1} Chief Complaint  Patient presents with   Chest Pain    Danny Brewer is a 51 y.o. male.  He has a history of coronary disease and CABG.  He recently had left leg surgery and saw his orthopedic doctor today who ordered him an ultrasound for some persistent swelling.  While he was getting his ultrasound he experienced some left upper chest pain associated with a hot sensation that radiated into his jaw and shoulder.  It was recommended he come down to the emergency department for evaluation.  He did not have a DVT on exam.  He said he has had this sensation before in his chest both prior to his CABG and since then.  He follows with Dr. Burt Knack cardiology.  The history is provided by the patient.  Chest Pain Pain location:  L chest Pain quality: aching   Pain radiates to:  Neck, L jaw and L shoulder Onset quality:  Sudden Timing:  Constant Progression:  Resolved Chronicity:  Recurrent Context: at rest   Relieved by:  None tried Worsened by:  Nothing Ineffective treatments:  None tried Associated symptoms: no abdominal pain, no cough, no diaphoresis, no fever, no headache, no nausea, no shortness of breath, no syncope and no vomiting   Risk factors: coronary artery disease and surgery        Home Medications Prior to Admission medications   Medication Sig Start Date End Date Taking? Authorizing Provider  acetaminophen (TYLENOL) 500 MG tablet Take 2 tablets (1,000 mg total) by mouth every 6 (six) hours. 07/08/21   Franchot Gallo, MD  Alirocumab (PRALUENT) 75 MG/ML SOAJ INJECT 1 ML INTO THE SKIN EVERY 14 (FOURTEEN) DAYS. 02/24/21   Sherren Mocha, MD  amLODipine (NORVASC) 5 MG tablet Take 1 tablet (5 mg total) by mouth daily. 05/26/21 01/14/22  Sherren Mocha, MD  aspirin 81 MG  tablet Take 1 tablet (81 mg total) by mouth daily. 10/11/18   Richardson Dopp T, PA-C  botulinum toxin Type A (BOTOX) 100 units SOLR injection Inject 100 Units into the muscle every 3 (three) months. To be administered by MD. 02/12/22   Sater, Nanine Means, MD  ezetimibe (ZETIA) 10 MG tablet Take 1 tablet (10 mg total) by mouth daily. 05/26/21   Sherren Mocha, MD  HYDROcodone-acetaminophen (NORCO) 5-325 MG tablet Take 1-2 tablets by mouth every 6 (six) hours as needed for severe pain (post-operatively). 12/30/21   Alexis Frock, MD  ketorolac (TORADOL) 10 MG tablet Take 1 tablet (10 mg total) by mouth every 8 (eight) hours as needed for moderate pain (or stent discomfort post-operatively). 12/30/21   Alexis Frock, MD  senna-docusate (SENOKOT-S) 8.6-50 MG tablet Take 1 tablet by mouth 2 (two) times daily. While taking stronger pain meds to prevent constipation 12/30/21   Alexis Frock, MD  sitaGLIPtin (JANUVIA) 100 MG tablet Take 100 mg by mouth daily.    [provider]  tamsulosin (FLOMAX) 0.4 MG CAPS capsule Take 0.4 mg by mouth at bedtime.    [provider]      Allergies    Antihistamines, diphenhydramine-type; Bactrim [sulfamethoxazole-trimethoprim]; Demerol; Morphine and related; Compazine [prochlorperazine]; Metoprolol; and Statins    Review of Systems   Review of Systems  Constitutional:  Negative for diaphoresis and fever.  Eyes:  Negative for visual disturbance.  Respiratory:  Negative for cough and shortness of breath.   Cardiovascular:  Positive for chest pain. Negative for syncope.  Gastrointestinal:  Negative for abdominal pain, nausea and vomiting.  Neurological:  Negative for headaches.    Physical Exam Updated Vital Signs BP (!) 149/105   Pulse 64   Temp 97.6 F (36.4 C) (Oral)   Resp 18   Ht 5\' 9"  (1.753 m)   Wt 81.5 kg   SpO2 100%   BMI 26.53 kg/m  Physical Exam Vitals and nursing note reviewed.  Constitutional:      General: He is not in acute  distress.    Appearance: He is well-developed.  HENT:     Head: Normocephalic and atraumatic.  Eyes:     Conjunctiva/sclera: Conjunctivae normal.  Cardiovascular:     Rate and Rhythm: Normal rate and regular rhythm.     Heart sounds: Normal heart sounds. No murmur heard. Pulmonary:     Effort: Pulmonary effort is normal. No respiratory distress.     Breath sounds: Normal breath sounds.  Abdominal:     Palpations: Abdomen is soft.     Tenderness: There is no abdominal tenderness.  Musculoskeletal:        General: No swelling. Normal range of motion.     Cervical back: Neck supple.     Right lower leg: No tenderness.     Left lower leg: No tenderness.  Skin:    General: Skin is warm and dry.     Capillary Refill: Capillary refill takes less than 2 seconds.  Neurological:     General: No focal deficit present.     Mental Status: He is alert.  Psychiatric:        Mood and Affect: Mood normal.     ED Results / Procedures / Treatments   Labs (all labs ordered are listed, but only abnormal results are displayed) Labs Reviewed  BASIC METABOLIC PANEL - Abnormal; Notable for the following components:      Result Value   Calcium 8.6 (*)    All other components within normal limits  CBC  TROPONIN I (HIGH SENSITIVITY)  TROPONIN I (HIGH SENSITIVITY)    EKG EKG Interpretation  Date/Time:  Friday February 27 2022 16:21:01 EDT Ventricular Rate:  72 PR Interval:  148 QRS Duration: 92 QT Interval:  372 QTC Calculation: 407 R Axis:   64 Text Interpretation: Normal sinus rhythm Normal ECG When compared with ECG of 30-Dec-2021 09:21, No significant change since last tracing Confirmed by 01-Jan-2022 [phone]) on 02/27/2022 9:15:20 PM  Radiology DG Chest 1 View  Result Date: 02/27/2022 CLINICAL DATA:  Left-sided chest pain 2 hours. Patient denies shortness of breath or radiation. EXAM: CHEST  1 VIEW COMPARISON:  Two-view chest x-ray 07/11/2021 FINDINGS: Heart size is normal.  Patient is status post median sternotomy for CABG. The lungs are clear. No edema or effusion is present. Visualized soft tissues and bony thorax are unremarkable. IMPRESSION: No acute cardiopulmonary disease. Electronically Signed   By: 09/08/2021 M.D.   On: 02/27/2022 17:24   VAS 03/01/2022 LOWER EXTREMITY VENOUS (DVT)  Result Date: 02/27/2022  Lower Venous DVT Study Patient Name:  Danny Brewer  Date of Exam:   02/27/2022 Medical Rec #: [id number]/01/[id number]      Accession #:    [id number] Date of Birth: 07-Jan-1971      Patient Gender: M Patient Age:   65 years Exam Location:  [facility] Procedure:      VAS [facility] LOWER  EXTREMITY VENOUS (DVT) [facility] --------------------------------------------------------------------------------  Indications: Left leg swelling.  Comparison Study: No prior studies. Performing Technologist: Jean Rosenthalachel Hodge RDMS, RVT  Examination Guidelines: A complete evaluation includes B-mode imaging, spectral Doppler, color Doppler, and power Doppler as needed of all accessible portions of each vessel. Bilateral testing is considered an integral part of a complete examination. Limited examinations for reoccurring indications may be performed as noted. The reflux portion of the exam is performed with the patient in reverse Trendelenburg.  +-----+---------------+---------+-----------+----------+--------------+ RIGHTCompressibilityPhasicitySpontaneityPropertiesThrombus Aging +-----+---------------+---------+-----------+----------+--------------+ CFV  Full           Yes      Yes                                 +-----+---------------+---------+-----------+----------+--------------+   +---------+---------------+---------+-----------+----------+--------------+ LEFT     CompressibilityPhasicitySpontaneityPropertiesThrombus Aging +---------+---------------+---------+-----------+----------+--------------+ CFV      Full           Yes      Yes                                  +---------+---------------+---------+-----------+----------+--------------+ SFJ      Full                                                        +---------+---------------+---------+-----------+----------+--------------+ FV Prox  Full                                                        +---------+---------------+---------+-----------+----------+--------------+ FV Mid   Full                                                        +---------+---------------+---------+-----------+----------+--------------+ FV DistalFull                                                        +---------+---------------+---------+-----------+----------+--------------+ PFV      Full                                                        +---------+---------------+---------+-----------+----------+--------------+ POP      Full           Yes      Yes                                 +---------+---------------+---------+-----------+----------+--------------+ PTV      Full                                                        +---------+---------------+---------+-----------+----------+--------------+  PERO     Full                                                        +---------+---------------+---------+-----------+----------+--------------+ Gastroc  Full                                                        +---------+---------------+---------+-----------+----------+--------------+     Summary: RIGHT: - No evidence of common femoral vein obstruction.  LEFT: - There is no evidence of deep vein thrombosis in the lower extremity.  - No cystic structure found in the popliteal fossa.  *See table(s) above for measurements and observations.    Preliminary     Procedures Procedures  {Document cardiac monitor, telemetry assessment procedure when appropriate:1}  Medications Ordered in ED Medications - No data to display  ED Course/ Medical Decision Making/ A&P                            Medical Decision Making  This patient complains of ***; this involves an extensive number of treatment Options and is a complaint that carries with it a high risk of complications and morbidity. The differential includes ***  I ordered, reviewed and interpreted labs, which included *** I ordered medication *** and reviewed PMP when indicated. I ordered imaging studies which included *** and I independently    visualized and interpreted imaging which showed *** Additional history obtained from *** Previous records obtained and reviewed *** I consulted *** and discussed lab and imaging findings and discussed disposition.  Cardiac monitoring reviewed, *** Social determinants considered, *** Critical Interventions: ***  After the interventions stated above, I reevaluated the patient and found *** Admission and further testing considered, ***   {Document critical care time when appropriate:1} {Document review of labs and clinical decision tools ie heart score, Chads2Vasc2 etc:1}  {Document your independent review of radiology images, and any outside records:1} {Document your discussion with family members, caretakers, and with consultants:1} {Document social determinants of health affecting pt's care:1} {Document your decision making why or why not admission, treatments were needed:1} Final Clinical Impression(s) / ED Diagnoses Final diagnoses:  None    Rx / DC Orders ED Discharge Orders     None

## 2022-02-27 NOTE — ED Triage Notes (Signed)
The pt has had lt sided chest  pain for the past  1-2 hours  no sob

## 2022-02-27 NOTE — Progress Notes (Signed)
Lower extremity venous left study completed.  Preliminary results relayed to [place], [place]. Patient with new left-sided jaw/arm/chest pain x1 hour. Taken to ED per provider request.  See CV Proc for preliminary results report.   Darlin Coco, RDMS, RVT

## 2022-02-27 NOTE — Discharge Instructions (Signed)
You were seen in the emergency department for some left-sided chest pain that radiated into your neck and shoulder.  You had a chest x-ray EKG and blood work that did not show any evidence of heart injury.  Please contact your cardiologist for close follow-up.  Return to the emergency department if any worsening or concerning symptoms.

## 2022-02-27 NOTE — ED Notes (Signed)
Patient verbalizes understanding of discharge instructions. Opportunity for questioning and answers were provided. Armband removed by staff, pt discharged from ED. Pt ambulatory to ED waiting room with steady gait.  

## 2022-02-27 NOTE — ED Provider Triage Note (Signed)
Emergency Medicine Provider Triage Evaluation Note  Danny Brewer , Brewer 51 y.o. male  was evaluated in triage.  Pt complains of left sided chest pain onset 1.5 hours. Notes that he felt warm when his symptoms started. No meds tried. Denies shortness of breath, nausea, vomiting.  Denies past medical history of hypertension.  Notes that he takes Januvia for diabetes.  Has Brewer history of Brewer quadruple bypass in 2020 his cardiologist is Dr. Burt Knack.  Denies past medical history of MI.   Review of Systems  Positive:  Negative:   Physical Exam  BP (!) 138/107   Pulse 76   Temp 98.3 F (36.8 C)   Resp 17   SpO2 99%  Gen:   Awake, no distress   Resp:  Normal effort  MSK:   Moves extremities without difficulty  Other:  No chest wall tenderness to palpation.  Medical Decision Making  Medically screening exam initiated at 4:24 PM.  Appropriate orders placed.  Danny Brewer was informed that the remainder of the evaluation will be completed by another provider, this initial triage assessment does not replace that evaluation, and the importance of remaining in the ED until their evaluation is complete.  Work-up initiated   Danny Mark A, PA-C 02/27/22 1626

## 2022-03-03 ENCOUNTER — Ambulatory Visit: Payer: BC Managed Care – PPO | Attending: Physician Assistant | Admitting: Physician Assistant

## 2022-03-03 ENCOUNTER — Encounter: Payer: Self-pay | Admitting: Physician Assistant

## 2022-03-03 VITALS — BP 134/86 | HR 91 | Ht 69.0 in | Wt 194.2 lb

## 2022-03-03 DIAGNOSIS — I6523 Occlusion and stenosis of bilateral carotid arteries: Secondary | ICD-10-CM | POA: Diagnosis not present

## 2022-03-03 DIAGNOSIS — R072 Precordial pain: Secondary | ICD-10-CM

## 2022-03-03 DIAGNOSIS — I2510 Atherosclerotic heart disease of native coronary artery without angina pectoris: Secondary | ICD-10-CM | POA: Diagnosis not present

## 2022-03-03 DIAGNOSIS — E7800 Pure hypercholesterolemia, unspecified: Secondary | ICD-10-CM

## 2022-03-03 DIAGNOSIS — I10 Essential (primary) hypertension: Secondary | ICD-10-CM

## 2022-03-03 DIAGNOSIS — Z951 Presence of aortocoronary bypass graft: Secondary | ICD-10-CM

## 2022-03-03 NOTE — Assessment & Plan Note (Signed)
The patient's blood pressure is controlled on his current regimen.  Continue current therapy.  

## 2022-03-03 NOTE — Assessment & Plan Note (Signed)
Hx of CABG in 2020. He has had a lot of chest symptoms since his surgery. He has not had a f/u stress test since his CABG. His symptoms sound more MSK than anginal. He has noted some fatigue and shortness of breath. His workup in the ED recently was reassuring. His EKG was unchanged. He would feel more comfortable with undergoing stress testing to rule out the possibility of ischemia. I do not think this is unreasonable.  Continue ASA 81 mg once daily, Zetia 10 mg once daily, Praluent 75 mg q 2 weeks. Lexiscan Myoview  F/u as planned unless stress testing is abnormal.

## 2022-03-03 NOTE — Assessment & Plan Note (Signed)
He has had some radiation of pain into his L neck. He is concerned about his carotid artery disease. I reassured him that carotid artery disease would not cause pain. It has been > 1 year since his last [place]. Arrange f/u Carotid US.

## 2022-03-03 NOTE — Patient Instructions (Signed)
Medication Instructions:  Your physician recommends that you continue on your current medications as directed. Please refer to the Current Medication list given to you today.  *If you need a refill on your cardiac medications before your next appointment, please call your pharmacy*   Lab Work: None ordered  If you have labs (blood work) drawn today and your tests are completely normal, you will receive your results only by: [place] (if you have MyChart) OR A paper copy in the mail If you have any lab test that is abnormal or we need to change your treatment, we will call you to review the results.   Testing/Procedures: Your physician has requested that you have a carotid duplex. This test is an ultrasound of the carotid arteries in your neck. It looks at blood flow through these arteries that supply the brain with blood. Allow one hour for this exam. There are no restrictions or special instructions.  Your physician has requested that you have a lexiscan myoview. For further information please visit [email or web address]. Please follow instruction sheet, BELOW:    You are scheduled for a Myocardial Perfusion Imaging Study  Please arrive 15 minutes prior to your appointment time for registration and insurance purposes.  The test will take approximately 3 to 4 hours to complete; you may bring reading material.  If someone comes with you to your appointment, they will need to remain in the main lobby due to limited space in the testing area. **If you are pregnant or breastfeeding, please notify the nuclear lab prior to your appointment**  How to prepare for your Myocardial Perfusion Test: Do not eat or drink 3 hours prior to your test, except you may have water. Do not consume products containing caffeine (regular or decaffeinated) 12 hours prior to your test. (ex: coffee, chocolate, sodas, tea). Do bring a list of your current medications with you.  If not listed below, you may take  your medications as normal. Do wear comfortable clothes (no dresses or overalls) and walking shoes, tennis shoes preferred (No heels or open toe shoes are allowed). Do NOT wear cologne, perfume, aftershave, or lotions (deodorant is allowed). If these instructions are not followed, your test will have to be rescheduled.      Follow-Up: At [facility], you and your health needs are our priority.  As part of our continuing mission to provide you with exceptional heart care, we have created designated Provider Care Teams.  These Care Teams include your primary Cardiologist (physician) and Advanced Practice Providers (APPs -  Physician Assistants and Nurse Practitioners) who all work together to provide you with the care you need, when you need it.  We recommend signing up for the patient portal called "MyChart".  Sign up information is provided on this After Visit Summary.  MyChart is used to connect with patients for Virtual Visits (Telemedicine).  Patients are able to view lab/test results, encounter notes, upcoming appointments, etc.  Non-urgent messages can be sent to your provider as well.   To learn more about what you can do with MyChart, go to [email or web address].    Your next appointment:   07/14/2022 arrive at 1:15  The format for your next appointment:   In Person  Provider:   Richardson Dopp, PA-C         Other Instructions   Important Information About Sugar

## 2022-03-03 NOTE — Assessment & Plan Note (Signed)
He has had chronic chest symptoms since his surgery. His most recent imaging procedures did not show any issues with his sternal healing. Proceed with stress testing as noted. As long as this is low risk, I will look into referral to PT for his chest discomfort.

## 2022-03-03 NOTE — Progress Notes (Signed)
Cardiology Office Note:    Date:  03/03/2022   ID:  Danny Brewer, DOB 06/29/1970, MRN [id number]  PCP:  Vernie Shanks, MD (Inactive)  [facility] Cardiologist:  Sherren Mocha, MD Cardiology APP:  Sharmon Revere    Referring MD: Hayden Rasmussen, MD   Chief Complaint:  Hospitalization Follow-up (ED visit with chest pain )    Patient Profile: Coronary artery disease  s/p CABG 10/2018 Echocardiogram 4/20: EF 60-65 Intol of beta-blocker  Diabetes mellitus 2 Hypertension  Hyperlipidemia  Intol of statins >> Rx w Alirocumab (Praluent) Carotid stenosis  [place] 10/2018, 10/2019: B/L ICA 1-39 ED  Nephrolithiasis (s/p stone extraction in July 2023)  Prior CV Studies:   Carotid US 10/21/20 Bilat ICA 1-39   Cardiac Catheterization 10/14/2018 LAD prox 95; D1 ost 80; D2 ost 80 RCA mid 85   Carotid US 10/15/2018 Bilat ICA 1-39   Echo 09/19/2018 EF 60-65   Echo 08/27/15 Mild conc LVH, EF 65-70, no RWMA, trivial MR, trivial TR  History of Present Illness:   Danny Brewer is a 51 y.o. male with the above problem list.  He was last seen in Aug 2023 by Dr. Burt Knack. He was seen in f/u at [facility] after recent L knee arthroscopy. He had persistent swelling in his leg and he was sent for [place] to r/o DVT. While getting his study, he had some chest pain and was sent to the ED. His hsTrops were neg. CXR demonstrated no acute findings. [place] was neg for DVT. His EKG was personally reviewed and demonstrated no acute ST-TW changes. He returns for f/u. He is here alone. He continues to have incisional pain from his CABG. This has been chronic since his surgery. He also has had a lot of shoulder issues and has has shoulder surgery on the L. He has pain in his chest and L should with movement. He has not really felt the same pain he had prior to his CABG. He has noted a little shortness of breath. He has been fatigued. He has not had orthopnea, leg edema, syncope. His job prior to his  CABG was fairly physical. He has not been able to do that work since his surgery. He left papers with me today for his disability application.      Past Medical History:  Diagnosis Date   BPH (benign prostatic hyperplasia)    Carotid artery disease ([place])    [place] 5/22: Bilat 4-16    Complication of anesthesia    Pt verble and "prophecizes" after anesthesia   Coronary Artery Disease cardiologist--- dr cooper   Coronary CTA 09/2018: pLAD 70-90; pD1+pD2 70-90; mRCA mixed plaque - cannot rule out 70-90; FFR suggests hemodynamically significant stenosis // LHC 10/2018: 2v CAD >> s/p  CABG (L-LAD, L radial-RI, S-D2, S-RCA) with Dr. 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   Alirocumab (PRALUENT) 75 MG/ML SOAJ INJECT 1 ML INTO THE SKIN EVERY 14 (FOURTEEN) DAYS.   amLODipine (NORVASC) 5 MG tablet Take 1 tablet (5 mg total) by mouth daily.   aspirin 81 MG tablet Take 1 tablet (81 mg total) by mouth daily.   botulinum toxin Type A (BOTOX) 100 units SOLR injection Inject 100 Units into the muscle every 3 (three) months. To be administered by MD.   ezetimibe (ZETIA) 10 MG tablet Take 1 tablet (10 mg total) by mouth daily.   sitaGLIPtin (JANUVIA) 100 MG tablet Take 100 mg by mouth daily.   tamsulosin (FLOMAX) 0.4 MG CAPS capsule Take 0.4 mg by mouth at bedtime.    Allergies:   Antihistamines, diphenhydramine-type; Bactrim [sulfamethoxazole-trimethoprim]; Demerol; Morphine and related; Compazine  [prochlorperazine]; Metoprolol; and Statins   Social History   Tobacco Use   Smoking status: Never   Smokeless tobacco: Never  Vaping Use   Vaping Use: Never used  Substance Use Topics   Alcohol use: No   Drug use: Never    Family Hx: The patient's family history includes Cancer in his father; Heart attack in his brother; Heart failure in his father; Heart murmur in his sister; Hypertension in his mother and sister; Sudden death in his brother.  Review of Systems  Constitutional: Negative for fever.  Respiratory:  Negative for cough.   Gastrointestinal:  Negative for hematochezia.  Genitourinary:  Negative for hematuria.     EKGs/Labs/Other Test Reviewed:    EKG:  EKG is not ordered today.  The ekg ordered today demonstrates n/a  Recent Labs: 12/30/2021: ALT 12 02/27/2022: BUN 10; Creatinine, Ser 1.12; Hemoglobin 13.1; Platelets 282; Potassium 3.6; Sodium 140   Recent Labs  Lab 02/27/22 1655 02/27/22 1828  TROPONINIHS 3 3     Recent Lipid Panel Recent Labs    07/23/21 0920  CHOL 136  TRIG 50  HDL 56  LDLCALC 69    VENOUS DUPLEX 02/27/22 Summary:  RIGHT:  - No evidence of common femoral vein obstruction.  LEFT:  - There is no evidence of deep vein thrombosis in the lower extremity.  - No cystic structure found in the popliteal fossa.   Risk Assessment/Calculations/Metrics:              Physical Exam:    VS:  BP 134/86   Pulse 91   Ht 5\' 9"  (1.753 m)   Wt 194 lb 3.2 oz (88.1 kg)   SpO2 98%   BMI 28.68 kg/m     Wt Readings from Last 3 Encounters:  03/03/22 194 lb 3.2 oz (88.1 kg)  02/27/22 179 lb 10.8 oz (81.5 kg)  01/14/22 192 lb 12.8 oz (87.5 kg)    Constitutional:      Appearance: Healthy appearance. Not in distress.  Neck:     Vascular: JVD normal.  Pulmonary:     Effort: Pulmonary effort is normal.     Breath sounds: No wheezing. No rales.  Cardiovascular:     Normal rate. Regular rhythm. Normal S1. Normal S2.      Murmurs: There is  no murmur.  Edema:    Peripheral edema present.    Pretibial: trace edema of the left pretibial area. Abdominal:     Palpations: Abdomen is soft.  Skin:    General: Skin is warm and dry.  Neurological:     General: No focal deficit present.     Mental Status: Alert and oriented to  person, place and time.          ASSESSMENT & PLAN:   Coronary artery disease Hx of CABG in 2020. He has had a lot of chest symptoms since his surgery. He has not had a f/u stress test since his CABG. His symptoms sound more MSK than anginal. He has noted some fatigue and shortness of breath. His workup in the ED recently was reassuring. His EKG was unchanged. He would feel more comfortable with undergoing stress testing to rule out the possibility of ischemia. I do not think this is unreasonable.  Continue ASA 81 mg once daily, Zetia 10 mg once daily, Praluent 75 mg q 2 weeks. Lexiscan Myoview  F/u as planned unless stress testing is abnormal.  Precordial chest pain He has had chronic chest symptoms since his surgery. His most recent imaging procedures did not show any issues with his sternal healing. Proceed with stress testing as noted. As long as this is low risk, I will look into referral to PT for his chest discomfort.   Carotid artery disease (HCC) He has had some radiation of pain into his L neck. He is concerned about his carotid artery disease. I reassured him that carotid artery disease would not cause pain. It has been > 1 year since his last Korea. Arrange f/u Carotid US.   Hyperlipidemia LDL optimal in Feb 2023. Continue Zetia 10 mg once daily, Praluent 75 mg q 2 weeks.   Essential hypertension The patient's blood pressure is controlled on his current regimen.  Continue current therapy.           Shared Decision Making/Informed Consent The risks [chest pain, shortness of breath, cardiac arrhythmias, dizziness, blood pressure fluctuations, myocardial infarction, stroke/transient ischemic attack,  nausea, vomiting, allergic reaction, radiation exposure, metallic taste sensation and life-threatening complications (estimated to be 1 in 10,000)], benefits (risk stratification, diagnosing coronary artery disease, treatment guidance) and alternatives of a nuclear stress test were discussed in detail with Mr. Pavlov and he agrees to proceed.   Dispo:  Return in about 5 months (around 08/03/2022) for Routine Follow Up, w/ Dr. Excell Seltzer, or Tereso Newcomer, PA-C.   Medication Adjustments/Labs and Tests Ordered: Current medicines are reviewed at length with the patient today.  Concerns regarding medicines are outlined above.  Tests Ordered: Orders Placed This Encounter  Procedures   Cardiac Stress Test: Informed Consent Details: Physician/Practitioner Attestation; Transcribe to consent form and obtain patient signature   MYOCARDIAL PERFUSION IMAGING   VAS US CAROTID   Medication Changes: No orders of the defined types were placed in this encounter.  Signed, Tereso Newcomer, PA-C  03/03/2022 2:49 PM    [facility] [address] [place], [place], [place]  [address] Phone: [phone]; Fax: [phone]

## 2022-03-03 NOTE — Assessment & Plan Note (Signed)
LDL optimal in Feb 2023. Continue Zetia 10 mg once daily, Praluent 75 mg q 2 weeks.

## 2022-03-04 ENCOUNTER — Encounter: Payer: Self-pay | Admitting: Neurology

## 2022-03-04 NOTE — Telephone Encounter (Signed)
Called and spoke with Danny Brewer at [facility] who states they have attempted to call pt multiple times to get permission to send the medication to our office. I advised I would reach out to the patient.  Once they have his permission we can schedule  Called the patient and he asked for me to send a mychart with the information needed for him to take care of his part. I asked him to reply letting us know when that was done.

## 2022-03-05 ENCOUNTER — Other Ambulatory Visit: Payer: Self-pay | Admitting: Physician Assistant

## 2022-03-05 DIAGNOSIS — I6523 Occlusion and stenosis of bilateral carotid arteries: Secondary | ICD-10-CM

## 2022-03-07 ENCOUNTER — Other Ambulatory Visit: Payer: Self-pay | Admitting: Neurology

## 2022-03-09 ENCOUNTER — Ambulatory Visit (INDEPENDENT_AMBULATORY_CARE_PROVIDER_SITE_OTHER): Payer: BC Managed Care – PPO | Admitting: Neurology

## 2022-03-09 ENCOUNTER — Encounter: Payer: Self-pay | Admitting: Neurology

## 2022-03-09 VITALS — BP 134/85 | HR 105 | Ht 69.0 in | Wt 194.0 lb

## 2022-03-09 DIAGNOSIS — G5132 Clonic hemifacial spasm, left: Secondary | ICD-10-CM | POA: Diagnosis not present

## 2022-03-09 DIAGNOSIS — R519 Headache, unspecified: Secondary | ICD-10-CM | POA: Diagnosis not present

## 2022-03-09 MED ORDER — ONABOTULINUMTOXINA 100 UNITS IJ SOLR
100.0000 [IU] | Freq: Once | INTRAMUSCULAR | Status: AC
  Administered 2022-03-09: 60 [IU] via INTRAMUSCULAR

## 2022-03-09 MED ORDER — ONABOTULINUMTOXINA 100 UNITS IJ SOLR: 100.0000 [IU] | INTRAMUSCULAR | 0 refills | Status: DC

## 2022-03-09 NOTE — Progress Notes (Signed)
Botox- 100 units x 2 vials Lot: [id number] Expiration: 03/2024 NDC: 2924-4628-63  Bacteriostatic 0.9% Sodium Chloride- 74mL total Lot:  Expiration:  NDC: 8177-1165-79  Dx: G51.32. S/P [facility] Used a sample from today office and will replace once the patient's Botox is scheduled 03/12/2022

## 2022-03-09 NOTE — Telephone Encounter (Signed)
Called and spoke with Mabell from [facility] and was able to have the patient give confirmation that it is ok to deliver the medication to our office. Provided our address for the pharmacy. They scheduled a delivery for 03/12/2022.   Pt was seen in office today and a sample was used to go ahead and give the patient the medication.  Will replace office stock once his comes in.  Confirmation # [id number]

## 2022-03-09 NOTE — Progress Notes (Signed)
[facility]  PATIENT: Danny Brewer DOB: 1970/09/08  REFERRING DOCTOR OR PCP:  Jenna Luo is PCP; referred by Susa Day SOURCE: Patient, notes from Dr. Tonita Cong, multiple MRI and CT reports and images on PACS  _________________________________   HISTORICAL  CHIEF COMPLAINT:  Chief Complaint  Patient presents with   Follow-up    Pt alone, here for follow up. Overall stable. 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   Oral medications had not helped and we will do Botox.  If no benefit also consider referral to Neurosurgery for microvascular decompression.    He has episodes of the left jaw tightening up for 30-90 minutes about once a month,  This is painful  like intense pins and needles.  Sometimes his happene after eating .  He also gets multiple small episodes of left facial twitch every day (several / hr on average).  THe smaller episodes are not painful.       He went to the ED 03/28/21 and BMP was noral.   Potassium and calcium were in normal range.    Vascular risks:  Type 2 NIDDM (since 2018), HTN, elevated cholesterol.      He denies any difficulty with gait, balance, numbness or strength now.   He sleeps well most nights.  He tries to  Live a stress free life.    No depression or anxiety.     Vascular risk factors:   HTN, NIDDM, CAD.   No h/o arrhythmia.   No significant OSA.   In May 2020 he had CABG due to CAD and angina.     He has changed his lifestyle and is eating better and exercising some.       Imaging:  MRI of the cervical spine 03/11/2017. It showed degenerative changes at C4-C5, C5-C6 and C6-C7 but also showed a small T2 hyperintense focus within the right dorsolateral  pons. He was referred for further evaluation.  MRI of the brain dated 08/25/2015 and 02/23/2010 show a stable 3-4 mm focus in the posterior right pons near the middle cerebellar peduncle. It is unchanged in size or signal characteristics.  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     MRI brain 04/22/2020 showed just one T2/FLAIR hyperintense focus in the right posterior pons, present in 2017 and one left temporal chronic microhemorrhage, not present in 2017     REVIEW OF SYSTEMS: Constitutional: No fevers, chills, sweats, or change in appetite.   He has excessive sleepiness Eyes: No visual changes, double vision, eye pain Ear, nose and throat: No hearing loss, ear pain, nasal congestion, sore throat Cardiovascular: No chest pain, palpitations Respiratory:  No shortness of breath at rest or with exertion.   No wheezes.  He snores GastrointestinaI: No nausea, vomiting, diarrhea, abdominal pain, fecal incontinence Genitourinary:  No dysuria, urinary retention or frequency.  No nocturia. Musculoskeletal:  No neck pain, back pain Integumentary: No rash, pruritus, skin lesions Neurological: as above Psychiatric: No depression at this time.  No anxiety Endocrine: No palpitations, diaphoresis, change in appetite, change in weigh or increased thirst Hematologic/Lymphatic:  No anemia, purpura, petechiae. Allergic/Immunologic: No itchy/runny eyes, nasal congestion, recent allergic reactions, rashes  ALLERGIES: Allergies  Allergen Reactions  Antihistamines, Diphenhydramine-Type Hives and Other (See Comments)    Seizures   Bactrim [Sulfamethoxazole-Trimethoprim] Itching    Throat itching and suspected pill esophagitis   Demerol Hives   Morphine And Related Hives    Esp with high dose morphine, possibly Dilaudid Tolerates hydrocodone, oxycodone   Compazine [Prochlorperazine] Other (See Comments)    Spastic movement   Metoprolol     Per patient caused "severe headache"   Statins Other (See  Comments)    myalgias    HOME MEDICATIONS:  Current Outpatient Medications:    acetaminophen (TYLENOL) 500 MG tablet, Take 2 tablets (1,000 mg total) by mouth every 6 (six) hours., Disp: 50 tablet, Rfl: 0   Alirocumab (PRALUENT) 75 MG/ML SOAJ, INJECT 1 ML INTO THE SKIN EVERY 14 (FOURTEEN) DAYS., Disp: 2 mL, Rfl: 11   aspirin 81 MG tablet, Take 1 tablet (81 mg total) by mouth daily., Disp: , Rfl:    botulinum toxin Type A (BOTOX) 100 units SOLR injection, Inject 100 Units into the muscle every 3 (three) months. To be administered by MD., Disp: 1 each, Rfl: 0   ezetimibe (ZETIA) 10 MG tablet, Take 1 tablet (10 mg total) by mouth daily., Disp: 90 tablet, Rfl: 2   sitaGLIPtin (JANUVIA) 100 MG tablet, Take 100 mg by mouth daily., Disp: , Rfl:    tamsulosin (FLOMAX) 0.4 MG CAPS capsule, Take 0.4 mg by mouth at bedtime., Disp: , Rfl:    amLODipine (NORVASC) 5 MG tablet, Take 1 tablet (5 mg total) by mouth daily., Disp: 90 tablet, Rfl: 2  Current Facility-Administered Medications:    botulinum toxin Type A (BOTOX) injection 100 Units, 100 Units, Intramuscular, Once, Shyan Scalisi, Pearletha Furl, MD  PAST MEDICAL HISTORY: Past Medical History:  Diagnosis Date   BPH (benign prostatic hyperplasia)    Carotid artery disease (HCC)    [place] 5/22: Bilat 1-39    Complication of anesthesia    Pt verble and "prophecizes" after anesthesia   Coronary Artery Disease cardiologist--- dr cooper   Coronary CTA 09/2018: pLAD 70-90; pD1+pD2 70-90; mRCA mixed plaque - cannot rule out 70-90; FFR suggests hemodynamically significant stenosis // LHC 10/2018: 2v CAD >> s/p  CABG (L-LAD, L radial-RI, S-D2, S-RCA) with Dr. 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   LIMA--LAD;  SVG--D2;  SVG--dRCA;  left radial --- RI   Type 2 diabetes mellitus (HCC)    followed by pcp    (10-01-2020 checks blood sugar 3 times wkly,  fasting sugar-- 72--105)   Wears glasses     PAST SURGICAL HISTORY: Past Surgical History:  Procedure Laterality Date   CORONARY ARTERY BYPASS GRAFT N/A 10/17/2018   Procedure: CORONARY ARTERY BYPASS GRAFTING (CABG), ON PUMP, TIMES FOUR, USING LIMA TO LAD, ENDOSCOPICALLY HARVESTED GREATER SAPHENOUS VEIN TO DIAG 2, SVG TO DISTAL RCA, AND LEFT RADIAL TO RAMUS INTERMEDIUS;  Surgeon: [name], MD;  Location: [facility];  Service: Open Heart Surgery;  Laterality: N/A;   CYSTOSCOPY WITH RETROGRADE PYELOGRAM, URETEROSCOPY AND STENT PLACEMENT Right 12/30/2021   Procedure: CYSTOSCOPY WITH RETROGRADE PYELOGRAM, URETEROSCOPY AND STENT PLACEMENT, BASKET OF STONES;  Surgeon: Sebastian Ache, MD;  Location: [facility];  Service: Urology;  Laterality: Right;   ENDOVEIN HARVEST OF GREATER SAPHENOUS VEIN Right 10/17/2018  Procedure: [place] Of Greater Saphenous Vein;  Surgeon: Grace Isaac, MD;  Location: [facility];  Service: Open Heart Surgery;  Laterality: Right;   HAND SURGERY Left 1987   complex laceration   INGUINAL HERNIA REPAIR Bilateral 05/27/2015   Procedure: LAPAROSCOPIC BILATERAL  INGUINAL HERNIA REPAIRS WITH MESH;  Surgeon: Michael Boston, MD;  Location: [facility];  Service: General;  Laterality: Bilateral;   KNEE ARTHROSCOPY Left 1991   LEFT HEART CATH AND CORONARY ANGIOGRAPHY N/A 10/14/2018   Procedure: LEFT HEART CATH AND CORONARY ANGIOGRAPHY;  Surgeon: Belva Crome, MD;  Location: [facility];  Service: Cardiovascular;  Laterality: N/A;   PENILE PROSTHESIS IMPLANT N/A 10/03/2020   Procedure: PLACEMENT OF PENILE PROTHESIS INFLATABLE;  Surgeon: Franchot Gallo, MD;  Location: [facility];  Service: Urology;  Laterality: N/A;  pubis area   RADIAL ARTERY HARVEST Left 10/17/2018   Procedure: RADIAL ARTERY HARVEST;  Surgeon: Grace Isaac, MD;  Location: [place];  Service: Open Heart Surgery;  Laterality: Left;    REMOVAL OF PENILE PROSTHESIS N/A 07/07/2021   Procedure: EXPLANT  AND REPLACEMENT OF COLOPLAST TITAN PENILE PROSTHESIS;  Surgeon: Franchot Gallo, MD;  Location: [facility];  Service: Urology;  Laterality: N/A;   SCROTAL EXPLORATION N/A 07/07/2021   Procedure: EXPLORATION OF PENILE PROSTHESIS;  Surgeon: Franchot Gallo, MD;  Location: [facility];  Service: Urology;  Laterality: N/A;   SHOULDER CLOSED REDUCTION Left 09/04/2019   Procedure: CLOSED MANIPULATION SHOULDER;  Surgeon: Vickey Huger, MD;  Location: [facility];  Service: Orthopedics;  Laterality: Left;   SHOULDER SURGERY Left 2022   TEE WITHOUT CARDIOVERSION N/A 10/17/2018   Procedure: TRANSESOPHAGEAL ECHOCARDIOGRAM (TEE);  Surgeon: Grace Isaac, MD;  Location: [facility];  Service: Open Heart Surgery;  Laterality: N/A;    FAMILY HISTORY: Family History  Problem Relation Age of Onset   Hypertension Mother    Cancer Father    Heart failure Father    Sudden death Brother    Heart attack Brother    Hypertension Sister    Heart murmur Sister     SOCIAL HISTORY:  Social History   Socioeconomic History   Marital status: Married    Spouse name: Not on file   Number of children: Not on file   Years of education: 14   Highest education level: Not on file  Occupational History   Occupation: [employer]  Tobacco Use   Smoking status: Never   Smokeless tobacco: Never  Vaping Use   Vaping Use: Never used  Substance and Sexual Activity   Alcohol use: No   Drug use: Never   Sexual activity: Not on file  Other Topics Concern   Not on file  Social History Narrative   Right handed   Caffeine use: 1 tea/day   Social Determinants of Health   Financial Resource Strain: Not on file  Food Insecurity: Not on file  Transportation Needs: Not on file  Physical Activity: Not on file  Stress: Not on file  Social Connections: Not on file  Intimate Partner Violence: Not on file     PHYSICAL EXAM  Vitals:   03/09/22 1443  BP: 134/85   Pulse: (!) 105  Weight: 194 lb (88 kg)  Height: 5\' 9"  (1.753 m)     Body mass index is 28.65 kg/m.   General: The patient is well-developed and well-nourished and in no acute distress.  Pharynx is Mallampati 2  Neck:   The neck is  nontender with slightly reduced ROM.    Skin: Extremities are without significant edema.     Musculoskeletal:  Back is nontender  Neurologic Exam  Mental status: The patient is alert and oriented x 3 at the time of the examination. The patient has apparent normal recent and remote memory, with an apparently normal attention span and concentration ability.   Speech is normal.  Cranial nerves: Extraocular movements are full.  Facial strength and sensation is normal.  The tongue is midline, and the patient has symmetric elevation of the soft palate. No obvious hearing deficits are noted.  Motor:  Muscle bulk is normal.   Tone is normal. Strength is  5 / 5 in all 4 extremities.   Sensory: Sensory testing is intact to pinprick, soft touch and vibration sensation in all 4 extremities.  Coordination: Cerebellar testing reveals good finger-nose-finger and heel-to-shin bilaterally.  Gait and station: Station is normal.   Gait is normal. Tandem gait is normal. Romberg is negative.   Reflexes: Deep tendon reflexes are 3 at knees and 2 elsewhere.         DIAGNOSTIC DATA (LABS, IMAGING, TESTING) - I reviewed patient records, labs, notes, testing and imaging myself where available.  Lab Results  Component Value Date   WBC 6.0 02/27/2022   HGB 13.1 02/27/2022   HCT 41.1 02/27/2022   MCV 88.4 02/27/2022   PLT 282 02/27/2022      Component Value Date/Time   NA 140 02/27/2022 1655   NA 140 07/23/2021 0920   K 3.6 02/27/2022 1655   CL 108 02/27/2022 1655   CO2 26 02/27/2022 1655   GLUCOSE 94 02/27/2022 1655   BUN 10 02/27/2022 1655   BUN 18 07/23/2021 0920   CREATININE 1.12 02/27/2022 1655   CREATININE 0.93 12/18/2014 1157   CALCIUM 8.6 (L)  02/27/2022 1655   PROT 6.9 12/30/2021 0922   PROT 7.0 07/23/2021 0920   ALBUMIN 4.3 12/30/2021 0922   ALBUMIN 4.5 07/23/2021 0920   AST 16 12/30/2021 0922   ALT 12 12/30/2021 0922   ALKPHOS 55 12/30/2021 0922   BILITOT 0.4 12/30/2021 0922   BILITOT 0.3 07/23/2021 0920   GFRNONAA >60 02/27/2022 1655   GFRNONAA >89 12/18/2014 1157   GFRAA >60 11/09/2019 0854   GFRAA >89 12/18/2014 1157          ASSESSMENT AND PLAN  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    we discussed that the Botox does not help that I would consider referral to neurosurgery for microvascular decompression as he has compression of the proximal left facial nerve by a vascular loop 3.    Rtc 3 months or sooner if new or worsening symptoms  Liston Thum A. Felecia Shelling, MD, [facility] [id number], [id number] PM Certified in Neurology, Clinical Neurophysiology, Sleep Medicine, Pain Medicine and Neuroimaging  [facility] [address], [place], [place] [address] [phone]

## 2022-03-19 ENCOUNTER — Telehealth (HOSPITAL_COMMUNITY): Payer: Self-pay | Admitting: *Deleted

## 2022-03-19 NOTE — Telephone Encounter (Signed)
Close encounter 

## 2022-03-20 ENCOUNTER — Ambulatory Visit (HOSPITAL_BASED_OUTPATIENT_CLINIC_OR_DEPARTMENT_OTHER)
Admission: RE | Admit: 2022-03-20 | Discharge: 2022-03-20 | Disposition: A | Discharge status: Home / Self Care | Payer: BC Managed Care – PPO | Source: Ambulatory Visit | Attending: Physician Assistant | Admitting: Physician Assistant

## 2022-03-20 ENCOUNTER — Ambulatory Visit (HOSPITAL_COMMUNITY)
Admission: RE | Admit: 2022-03-20 | Discharge: 2022-03-20 | Disposition: A | Discharge status: Home / Self Care | Payer: BC Managed Care – PPO | Source: Ambulatory Visit | Attending: Physician Assistant | Admitting: Physician Assistant

## 2022-03-20 DIAGNOSIS — Z951 Presence of aortocoronary bypass graft: Secondary | ICD-10-CM | POA: Insufficient documentation

## 2022-03-20 DIAGNOSIS — R072 Precordial pain: Secondary | ICD-10-CM | POA: Diagnosis present

## 2022-03-20 DIAGNOSIS — I2510 Atherosclerotic heart disease of native coronary artery without angina pectoris: Secondary | ICD-10-CM | POA: Diagnosis present

## 2022-03-20 DIAGNOSIS — I6523 Occlusion and stenosis of bilateral carotid arteries: Secondary | ICD-10-CM | POA: Diagnosis present

## 2022-03-20 LAB — MYOCARDIAL PERFUSION IMAGING
LV dias vol: 100 mL (ref 62–150)
LV sys vol: 52 mL
Nuc Stress EF: 48 %
Peak HR: 109 {beats}/min
Rest HR: 81 {beats}/min
Rest Nuclear Isotope Dose: 11 mCi
SDS: 0
SRS: 0
SSS: 0
ST Depression (mm): 0 mm
Stress Nuclear Isotope Dose: 30.5 mCi
TID: 0.93

## 2022-03-20 MED ORDER — TECHNETIUM TC 99M TETROFOSMIN IV KIT
11.0000 | PACK | Freq: Once | INTRAVENOUS | Status: AC | PRN
  Administered 2022-03-20: 11 via INTRAVENOUS

## 2022-03-20 MED ORDER — TECHNETIUM TC 99M TETROFOSMIN IV KIT
30.5000 | PACK | Freq: Once | INTRAVENOUS | Status: AC | PRN
  Administered 2022-03-20: 30.5 via INTRAVENOUS

## 2022-03-20 MED ORDER — REGADENOSON 0.4 MG/5ML IV SOLN
0.4000 mg | Freq: Once | INTRAVENOUS | Status: AC
  Administered 2022-03-20: 0.4 mg via INTRAVENOUS

## 2022-03-25 ENCOUNTER — Ambulatory Visit (HOSPITAL_COMMUNITY): Payer: BC Managed Care – PPO | Attending: Physician Assistant

## 2022-03-25 ENCOUNTER — Telehealth: Payer: Self-pay | Admitting: *Deleted

## 2022-03-25 DIAGNOSIS — I2510 Atherosclerotic heart disease of native coronary artery without angina pectoris: Secondary | ICD-10-CM | POA: Diagnosis present

## 2022-03-25 DIAGNOSIS — Z951 Presence of aortocoronary bypass graft: Secondary | ICD-10-CM

## 2022-03-25 LAB — ECHOCARDIOGRAM COMPLETE
Area-P 1/2: 4.06 cm2
S' Lateral: 2.4 cm

## 2022-03-25 NOTE — Telephone Encounter (Signed)
-----   Message from Liliane Shi, [place] sent at 03/25/2022  8:14 AM EDT ----- Patient did not view MyChart comments.  Please notify patient of results. Richardson Dopp, PA-C    03/25/2022 8:14 AM

## 2022-03-25 NOTE — Progress Notes (Signed)
Pt has been made aware of normal result and verbalized understanding.  jw

## 2022-03-26 ENCOUNTER — Encounter: Payer: Self-pay | Admitting: Physician Assistant

## 2022-03-26 ENCOUNTER — Telehealth: Payer: Self-pay | Admitting: Physician Assistant

## 2022-03-26 MED ORDER — EZETIMIBE 10 MG PO TABS: 10.0000 mg | ORAL_TABLET | Freq: Every day | ORAL | 2 refills | Status: DC

## 2022-03-26 MED ORDER — AMLODIPINE BESYLATE 5 MG PO TABS: 5.0000 mg | ORAL_TABLET | Freq: Every day | ORAL | 3 refills | Status: DC

## 2022-03-26 NOTE — Progress Notes (Signed)
Pt has been made aware of normal result and verbalized understanding.  jw

## 2022-03-26 NOTE — Telephone Encounter (Signed)
Returned call to pt and his Echocardiogram results have been explained to pt.

## 2022-03-26 NOTE — Telephone Encounter (Signed)
Follow Up:    Patient said he saw his Echo results on My Chart. He need someone to please call and explain it to him.

## 2022-03-30 ENCOUNTER — Other Ambulatory Visit (HOSPITAL_COMMUNITY): Payer: BC Managed Care – PPO

## 2022-04-17 ENCOUNTER — Telehealth: Payer: Self-pay | Admitting: Physician Assistant

## 2022-04-17 DIAGNOSIS — Z0279 Encounter for issue of other medical certificate: Secondary | ICD-10-CM

## 2022-04-17 NOTE — Telephone Encounter (Signed)
Disability form and payment received. Form in [facility] box.

## 2022-04-21 NOTE — Telephone Encounter (Signed)
Disability paperwork given to [facility].

## 2022-04-28 ENCOUNTER — Encounter: Payer: Self-pay | Admitting: Physician Assistant

## 2022-04-28 NOTE — Telephone Encounter (Signed)
Ready. On my desk. He will need records printed from his chart. See page 2 on Form 7A. I would print out the CABG report, Discharge Summary from April 2020, Discharge Summary from his surgery in May 2020, Current Med List, My last office note, Recent Myoview, Recent Echocardiogram. Tereso Newcomer, PA-C    04/28/2022 6:16 PM

## 2022-04-29 ENCOUNTER — Telehealth: Payer: Self-pay | Admitting: Physician Assistant

## 2022-04-29 NOTE — Telephone Encounter (Signed)
Called patient to pick up his completed disability form . Form scanned into patient's documents.

## 2022-04-29 NOTE — Telephone Encounter (Signed)
All records have been printed.  Handed red folder back to [place] at the [employer].

## 2022-05-05 ENCOUNTER — Telehealth: Payer: Self-pay | Admitting: *Deleted

## 2022-05-05 NOTE — Telephone Encounter (Signed)
   Pre-operative Risk Assessment    Patient Name: Danny Brewer  DOB: Aug 29, 1970 MRN: [id number]      Request for Surgical Clearance    Procedure:   RIGHT SHOULDER ARTHROPLASTY  Date of Surgery:  Clearance 07/03/22                                 Surgeon:  DR. Caryn Bee SUPPLE Surgeon's Group or Practice Name:  [facility] Phone number:  [phone] Fax number:  [phone] ATTN: Aida Raider   Type of Clearance Requested:   - Medical ; ASA INSTRUCTIONS PLEASE    Type of Anesthesia:  General    Additional requests/questions:    Elpidio Anis   05/05/2022, 4:10 PM

## 2022-05-06 NOTE — Telephone Encounter (Signed)
   Name: Marinus Eicher  DOB: 1971-02-02  MRN: [id number]  Primary Cardiologist: Tonny Bollman, MD  Chart reviewed as part of pre-operative protocol coverage. Because of Ishaq Pipkins's past medical history and time since last visit, he will require a follow-up phone-office visit in order to better assess preoperative cardiovascular risk.  Pre-op covering staff: - Please schedule appointment and call patient to inform them. If patient already had an upcoming appointment within acceptable timeframe, please add "pre-op clearance" to the appointment notes so provider is aware. - Please contact requesting surgeon's office via preferred method (i.e, phone, fax) to inform them of need for appointment prior to surgery.  Hx of CAD s/p CABG 2020. If deemed necessary by surgical team, may hold Aspirin 5-7 days prior to planned procedure and resume promptly post procedure.   Alver Sorrow, NP  05/06/2022, 9:07 AM

## 2022-05-07 NOTE — Telephone Encounter (Signed)
Pt has upcoming appt with Tereso Newcomer, PA on 12/8. Clearance will be addressed at this time.

## 2022-05-11 ENCOUNTER — Telehealth: Payer: Self-pay | Admitting: Neurology

## 2022-05-11 NOTE — Telephone Encounter (Signed)
Called pt back. Headaches started back last week. States he has discussed with Dr. Epimenio Foot and would like to come in for appt. Scheduled appt for 05/12/22 at 9am with Dr. Epimenio Foot. Asked him to check in around 830am

## 2022-05-11 NOTE — Telephone Encounter (Signed)
Pt is calling. Stated he is having really bad headaches. Stated he needs to talk to nurse.

## 2022-05-12 ENCOUNTER — Ambulatory Visit (INDEPENDENT_AMBULATORY_CARE_PROVIDER_SITE_OTHER): Payer: BC Managed Care – PPO | Admitting: Neurology

## 2022-05-12 ENCOUNTER — Encounter: Payer: Self-pay | Admitting: Neurology

## 2022-05-12 ENCOUNTER — Other Ambulatory Visit: Payer: Self-pay | Admitting: Neurology

## 2022-05-12 ENCOUNTER — Telehealth: Payer: Self-pay | Admitting: Neurology

## 2022-05-12 VITALS — BP 144/100 | HR 98

## 2022-05-12 DIAGNOSIS — I10 Essential (primary) hypertension: Secondary | ICD-10-CM

## 2022-05-12 DIAGNOSIS — G5132 Clonic hemifacial spasm, left: Secondary | ICD-10-CM | POA: Diagnosis not present

## 2022-05-12 DIAGNOSIS — M542 Cervicalgia: Secondary | ICD-10-CM | POA: Diagnosis not present

## 2022-05-12 DIAGNOSIS — R519 Headache, unspecified: Secondary | ICD-10-CM | POA: Diagnosis not present

## 2022-05-12 DIAGNOSIS — G4452 New daily persistent headache (NDPH): Secondary | ICD-10-CM

## 2022-05-12 NOTE — Telephone Encounter (Signed)
Dr Epimenio Foot saw pt today. I called pt back. Trigger point injection helped a lot. However, he is still having a lot of pressure in head. Wanting MRI ordered if MD agrees. Confirmed he took amlodipine when he got home after visit this morning around 11am. Checked BP at 2:30pm and it was 147/98.

## 2022-05-12 NOTE — Progress Notes (Signed)
[facility]  PATIENT: Danny Brewer DOB: 03-21-1971  REFERRING DOCTOR OR PCP:  Lynnea Ferrier is PCP; referred by Jene Every SOURCE: Patient, notes from Dr. Shelle Iron, multiple MRI and CT reports and images on PACS  _________________________________   HISTORICAL  CHIEF COMPLAINT:  Chief Complaint  Patient presents with   Follow-up    Pt in room #11 and alone. Pt here today to f/u with his headaches.    HISTORY OF PRESENT ILLNESS:  Update 05/12/2022 We did Botox for HFS and he is much better.   He now has only rare milder spasms.   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   Oral medications had not helped  If benefit wanes consider referral to Neurosurgery for microvascular decompression.      He also has pain in the head - bilateral near the vertex left >> right.    Pain is pressure like and with some occipital to vertex radiation.   Moving head to look up increases pain.  No other movements affect pain.   HA starts in the morning and intensifies..  No nausea/vomiting.    Negligible photophobia.  Sometimes he feels lightheaded when pain is more intense.     His BP ws elevated today.  He was taken off his BP med.     He denies any difficulty with gait, balance, numbness or strength now.   He sleeps well most nights.  He tries to  Live a stress free life.    No depression or anxiety.     Vascular risk factors:   HTN, NIDDM, CAD.   No h/o arrhythmia.   No significant OSA.   In May 2020 he had CABG due to CAD and angina.     He has changed his lifestyle and is eating better and exercising some.      Imaging:  MRI of the cervical spine 03/11/2017. It showed degenerative changes at C4-C5, C5-C6 and C6-C7 but also showed a small T2 hyperintense focus within the right dorsolateral pons. He was referred for further evaluation.  MRI of the  brain dated 08/25/2015 and 02/23/2010 show a stable 3-4 mm focus in the posterior right pons near the middle cerebellar peduncle. It is unchanged in size or signal characteristics.  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     MRI brain 04/22/2020 showed just one T2/FLAIR hyperintense focus in the right posterior pons, present in 2017 and one left temporal chronic microhemorrhage, not present in 2017     REVIEW OF SYSTEMS: Constitutional: No fevers, chills, sweats, or change in appetite.   He has excessive sleepiness Eyes: No visual changes, double vision, eye pain Ear, nose and throat: No hearing loss, ear pain, nasal congestion, sore throat Cardiovascular: No chest pain, palpitations Respiratory:  No shortness of breath at rest or with exertion.   No wheezes.  He snores GastrointestinaI: No nausea, vomiting, diarrhea, abdominal pain, fecal incontinence Genitourinary:  No dysuria, urinary retention or frequency.  No nocturia. Musculoskeletal:  No neck pain, back pain Integumentary: No rash, pruritus, skin lesions Neurological: as above Psychiatric: No depression at this time.  No anxiety Endocrine: No palpitations, diaphoresis, change in appetite, change in weigh or increased thirst Hematologic/Lymphatic:  No anemia, purpura, petechiae. Allergic/Immunologic: No itchy/runny eyes, nasal congestion, recent allergic reactions, rashes  ALLERGIES: Allergies  Allergen Reactions   Antihistamines, Diphenhydramine-Type Hives  and Other (See Comments)    Seizures   Bactrim [Sulfamethoxazole-Trimethoprim] Itching    Throat itching and suspected pill esophagitis   Demerol Hives   Morphine And Related Hives    Esp with high dose morphine, possibly Dilaudid Tolerates hydrocodone, oxycodone   Compazine [Prochlorperazine] Other (See Comments)    Spastic movement   Metoprolol     Per patient caused "severe headache"   Statins Other (See Comments)    myalgias    HOME MEDICATIONS:  Current  Outpatient Medications:    acetaminophen (TYLENOL) 500 MG tablet, Take 2 tablets (1,000 mg total) by mouth every 6 (six) hours., Disp: 50 tablet, Rfl: 0   Alirocumab (PRALUENT) 75 MG/ML SOAJ, INJECT 1 ML INTO THE SKIN EVERY 14 (FOURTEEN) DAYS., Disp: 2 mL, Rfl: 11   amLODipine (NORVASC) 5 MG tablet, Take 1 tablet (5 mg total) by mouth daily., Disp: 90 tablet, Rfl: 3   aspirin 81 MG tablet, Take 1 tablet (81 mg total) by mouth daily., Disp: , Rfl:    botulinum toxin Type A (BOTOX) 100 units SOLR injection, Inject 100 Units into the muscle every 3 (three) months. To be administered by MD., Disp: 1 each, Rfl: 0   ezetimibe (ZETIA) 10 MG tablet, Take 1 tablet (10 mg total) by mouth daily., Disp: 90 tablet, Rfl: 2   sitaGLIPtin (JANUVIA) 100 MG tablet, Take 100 mg by mouth daily., Disp: , Rfl:    tamsulosin (FLOMAX) 0.4 MG CAPS capsule, Take 0.4 mg by mouth at bedtime., Disp: , Rfl:   PAST MEDICAL HISTORY: Past Medical History:  Diagnosis Date   BPH (benign prostatic hyperplasia)    Carotid artery disease ([place])    [place] 5/22: Bilat [id number]    Complication of anesthesia    Pt verble and "prophecizes" after anesthesia   Coronary Artery Disease cardiologist--- dr cooper   Coronary CTA 09/2018: pLAD 70-90; pD1+pD2 70-90; mRCA mixed plaque - cannot rule out 70-90; FFR suggests hemodynamically significant stenosis // LHC 10/2018: 2v CAD >> s/p  CABG (L-LAD, L radial-RI, S-D2, S-RCA) with Dr. 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   LIMA--LAD;  SVG--D2;  SVG--dRCA;  left radial --- RI   Type 2 diabetes mellitus ([place])    followed by pcp   (10-01-2020 checks blood sugar 3 times wkly,  fasting sugar-- 72--105)   Wears glasses     PAST SURGICAL HISTORY: Past Surgical History:  Procedure  Laterality Date   CORONARY ARTERY BYPASS GRAFT N/A 10/17/2018   Procedure: CORONARY ARTERY BYPASS GRAFTING (CABG), ON PUMP, TIMES FOUR, USING LIMA TO LAD, ENDOSCOPICALLY HARVESTED GREATER SAPHENOUS VEIN TO DIAG 2, SVG TO DISTAL RCA, AND LEFT RADIAL TO RAMUS INTERMEDIUS;  Surgeon: Grace Isaac, MD;  Location: [facility];  Service: Open Heart Surgery;  Laterality: N/A;   CYSTOSCOPY WITH RETROGRADE PYELOGRAM, URETEROSCOPY AND STENT PLACEMENT Right 12/30/2021   Procedure: CYSTOSCOPY WITH RETROGRADE PYELOGRAM, URETEROSCOPY AND STENT PLACEMENT, BASKET OF STONES;  Surgeon: Alexis Frock, MD;  Location: [facility];  Service: Urology;  Laterality: Right;   ENDOVEIN HARVEST OF GREATER SAPHENOUS VEIN Right 10/17/2018   Procedure: [place] Of Greater Saphenous Vein;  Surgeon: Grace Isaac, MD;  Location: [facility]  OR;  Service: Open Heart Surgery;  Laterality: Right;   HAND SURGERY Left 1987   complex laceration   INGUINAL HERNIA REPAIR Bilateral 05/27/2015   Procedure: LAPAROSCOPIC BILATERAL  INGUINAL HERNIA REPAIRS WITH MESH;  Surgeon: Michael Boston, MD;  Location: [facility];  Service: General;  Laterality: Bilateral;   KNEE ARTHROSCOPY Left 1991   LEFT HEART CATH AND CORONARY ANGIOGRAPHY N/A 10/14/2018   Procedure: LEFT HEART CATH AND CORONARY ANGIOGRAPHY;  Surgeon: Belva Crome, MD;  Location: [facility];  Service: Cardiovascular;  Laterality: N/A;   PENILE PROSTHESIS IMPLANT N/A 10/03/2020   Procedure: PLACEMENT OF PENILE PROTHESIS INFLATABLE;  Surgeon: Franchot Gallo, MD;  [facility];  Service: Urology;  Laterality: N/A;  pubis area   RADIAL ARTERY HARVEST Left 10/17/2018   Procedure: RADIAL ARTERY HARVEST;  Surgeon: Grace Isaac, MD;  Location: [facility];  Service: Open Heart Surgery;  Laterality: Left;   REMOVAL OF PENILE PROSTHESIS N/A 07/07/2021   Procedure: EXPLANT  AND REPLACEMENT OF COLOPLAST TITAN PENILE PROSTHESIS;  Surgeon: Franchot Gallo, MD;   Location: [facility];  Service: Urology;  Laterality: N/A;   SCROTAL EXPLORATION N/A 07/07/2021   Procedure: EXPLORATION OF PENILE PROSTHESIS;  Surgeon: Franchot Gallo, MD;  [facility];  Service: Urology;  Laterality: N/A;   SHOULDER CLOSED REDUCTION Left 09/04/2019   Procedure: CLOSED MANIPULATION SHOULDER;  Surgeon: Vickey Huger, MD;  Location: [facility];  Service: Orthopedics;  Laterality: Left;   SHOULDER SURGERY Left 2022   TEE WITHOUT CARDIOVERSION N/A 10/17/2018   Procedure: TRANSESOPHAGEAL ECHOCARDIOGRAM (TEE);  Surgeon: Grace Isaac, MD;  Location: [facility];  Service: Open Heart Surgery;  Laterality: N/A;    FAMILY HISTORY: Family History  Problem Relation Age of Onset   Hypertension Mother    Cancer Father    Heart failure Father    Sudden death Brother    Heart attack Brother    Hypertension Sister    Heart murmur Sister     SOCIAL HISTORY:  Social History   Socioeconomic History   Marital status: Married    Spouse name: Not on file   Number of children: Not on file   Years of education: 14   Highest education level: Not on file  Occupational History   Occupation: [employer]  Tobacco Use   Smoking status: Never   Smokeless tobacco: Never  Vaping Use   Vaping Use: Never used  Substance and Sexual Activity   Alcohol use: No   Drug use: Never   Sexual activity: Not on file  Other Topics Concern   Not on file  Social History Narrative   Right handed   Caffeine use: 1 tea/day   Social Determinants of Health   Financial Resource Strain: Not on file  Food Insecurity: Not on file  Transportation Needs: Not on file  Physical Activity: Not on file  Stress: Not on file  Social Connections: Not on file  Intimate Partner Violence: Not on file     PHYSICAL EXAM  Vitals:   05/12/22 0920  BP: (!) 144/100  Pulse: 98    Recheck 10 minutes later:    154/98  80    There is no height or weight on file to calculate BMI.   General: The patient is  well-developed and well-nourished and in no acute distress.  Pharynx is Mallampati 2  Neck:   The neck is tender over left occiput with slightly reduced ROM.  Skin: Extremities are without significant edema.      Neurologic Exam  Mental status: The patient is alert and oriented x 3 at the time of the examination. The patient has apparent normal recent and remote memory, with an apparently normal attention span and concentration ability.   Speech is normal.  Cranial nerves: Extraocular movements are full.  Facial strength and sensation is normal.  The tongue is midline, and the patient has symmetric elevation of the soft palate. No obvious hearing deficits are noted.  20 visit there were no hemifacial spasms noted.  Motor:  Muscle bulk is normal.   Tone is normal. Strength is  5 / 5 in all 4 extremities.   Sensory: Sensory testing is intact to pinprick, soft touch and vibration sensation in all 4 extremities.  Coordination: Cerebellar testing reveals good finger-nose-finger and heel-to-shin bilaterally.  Gait and station: Station is normal.   Gait is normal. Tandem gait is normal. Romberg is negative.   Reflexes: Deep tendon reflexes are 3 at knees and 2 elsewhere.         DIAGNOSTIC DATA (LABS, IMAGING, TESTING) - I reviewed patient records, labs, notes, testing and imaging myself where available.  Lab Results  Component Value Date   WBC 6.0 02/27/2022   HGB 13.1 02/27/2022   HCT 41.1 02/27/2022   MCV 88.4 02/27/2022   PLT 282 02/27/2022      Component Value Date/Time   NA 140 02/27/2022 1655   NA 140 07/23/2021 0920   K 3.6 02/27/2022 1655   CL 108 02/27/2022 1655   CO2 26 02/27/2022 1655   GLUCOSE 94 02/27/2022 1655   BUN 10 02/27/2022 1655   BUN 18 07/23/2021 0920   CREATININE 1.12 02/27/2022 1655   CREATININE 0.93 12/18/2014 1157   CALCIUM 8.6 (L) 02/27/2022 1655   PROT 6.9 12/30/2021 0922   PROT 7.0 07/23/2021 0920   ALBUMIN 4.3 12/30/2021 0922   ALBUMIN 4.5  07/23/2021 0920   AST 16 12/30/2021 0922   ALT 12 12/30/2021 0922   ALKPHOS 55 12/30/2021 0922   BILITOT 0.4 12/30/2021 0922   BILITOT 0.3 07/23/2021 0920   GFRNONAA >60 02/27/2022 1655   GFRNONAA >89 12/18/2014 1157   GFRAA >60 11/09/2019 0854   GFRAA >89 12/18/2014 1157          ASSESSMENT AND PLAN  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  His next appointment is in about 1 month.      we discussed that the Botox does not help that I would consider referral to neurosurgery for microvascular decompression as he has compression of the proximal left facial nerve by a vascular loop Trigger point injection into the left splenius capitis and splenius cervicis muscles with 60 mg Depo-Medrol and 3 cc Marcaine using sterile technique.  He tolerated the procedure well and headache was much better within a few minutes. 3.    Blood pressure is elevated.  He did not take his amlodipine this morning and I recommended that he take it as soon as possible and remember to take it every day.  If blood pressure is elevated despite taking the medicine he may need to additional medication or change the dose.   4.   Rtc 1 months or sooner if new or worsening symptoms  Copper Basnett A. Epimenio FootSater, MD, [facility],FAAN 05/12/2022, 9:58 AM Certified in  Neurology, Clinical Neurophysiology, Sleep Medicine, Pain Medicine and Neuroimaging  [facility] [address], [place], [place] [address] [phone]

## 2022-05-12 NOTE — Telephone Encounter (Signed)
Pt is calling. Asking will Dr. Epimenio Foot do a xray of his head. Stated he is having pressure in the top of his head. Pt is requesting a call back from nurse.

## 2022-05-13 ENCOUNTER — Telehealth: Payer: Self-pay | Admitting: Neurology

## 2022-05-13 NOTE — Telephone Encounter (Signed)
Called and spoke w/ pt. Relayed message below. Pt verbalized understanding and appreciation.

## 2022-05-13 NOTE — Telephone Encounter (Signed)
MR brain wo contrast sent to GI for scheduling.  [employer]BS Berkley Harvey: [id number] exp. 05/13/22-06/11/22 [employer] MCD Berkley Harvey: [id number] exp. 05/13/22-06/27/22

## 2022-05-14 NOTE — Progress Notes (Addendum)
Cardiology Office Note:    Date:  05/15/2022   ID:  Danny Brewer, DOB 07/27/1970, MRN [id number]  PCP:  Ileana Ladd, MD (Inactive)  [facility] Providers Cardiologist:  Tonny Bollman, MD Cardiology APP:  Kennon Rounds     Referring MD: No ref. provider found   Chief Complaint:  F/u for CAD    Patient Profile: Coronary artery disease  s/p CABG 10/2018 Echocardiogram 09/19/18: EF 60-65 Intol of beta-blocker  Myoview 03/20/2022: EF 48, normal perfusion, low risk Echo 03/25/2022 EF 60-65, no RWMA, normal RVSF, trivial MR Diabetes mellitus 2 Hypertension  Hyperlipidemia  Intol of statins >> Rx w Alirocumab (Praluent) Carotid stenosis  [place] 10/21/20: Bilateral ICA 1-39 [place] 03/21/22: no ICA stenosis Bilat ED  Nephrolithiasis (s/p stone extraction in July 2023)    History of Present Illness:   Danny Brewer is a 51 y.o. male with the above problem list.  He was last seen 03/03/2022 for follow-up after a visit to the emergency room with chest pain.  A Lexiscan Myoview demonstrated no ischemia.  His EF was 48.  A follow-up echo demonstrated normal EF.  He returns for follow-up as well as surgical clearance.  He needs to undergo right shoulder arthroplasty with Dr. Rennis Chris under general anesthesia on 07/03/2022.  He is here alone.  He continues to have the same chest wall pain that he has had since his bypass surgery.  He has not had shortness of breath, syncope.  He walks about a mile a day.  He is able to go up steps.  EKG:  not done    Reviewed and updated this encounter:  Tobacco  Allergies  Meds  Problems  Med Hx  Surg Hx  Fam Hx     Review of Systems  Neurological:  Positive for vertigo.     Labs/Other Test Reviewed:    Recent Labs: 12/30/2021: ALT 12 02/27/2022: BUN 10; Creatinine, Ser 1.12; Hemoglobin 13.1; Platelets 282; Potassium 3.6; Sodium 140   Recent Lipid Panel Recent Labs    07/23/21 0920  CHOL 136  TRIG 50  HDL 56  LDLCALC 69    Risk  Assessment/Calculations/Metrics:         HYPERTENSION CONTROL Vitals:   05/15/22 0859 05/15/22 0931  BP: (!) 138/92 (!) 144/100    The patient's blood pressure is elevated above target today.  In order to address the patient's elevated BP: A current anti-hypertensive medication was adjusted today.       Physical Exam:    VS:  BP (!) 144/100   Pulse 94   Ht 5\' 9"  (1.753 m)   Wt 194 lb 9.6 oz (88.3 kg)   SpO2 97%   BMI 28.74 kg/m     Wt Readings from Last 3 Encounters:  05/15/22 194 lb 9.6 oz (88.3 kg)  03/20/22 194 lb (88 kg)  03/09/22 194 lb (88 kg)    Constitutional:      Appearance: Healthy appearance. Not in distress.  Neck:     Vascular: JVD normal.  Pulmonary:     Effort: Pulmonary effort is normal.     Breath sounds: No wheezing. No rales.  Cardiovascular:     Normal rate. Regular rhythm. Normal S1. Normal S2.      Murmurs: There is no murmur.  Edema:    Peripheral edema absent.  Abdominal:     Palpations: There is no hepatomegaly.         ASSESSMENT & PLAN:   Coronary  artery disease Hx of CABG in 2020. He has had a lot of chest symptoms since his surgery.  Recent Myoview low risk.  Echocardiogram demonstrated normal LV function.  I will refer him back to cardiothoracic surgery to see if there is anything anatomically that could be done to help alleviate his chest pain.  If not, we may need to consider sending him to pain management or physical therapy.  Continue aspirin 81 g daily, Praluent 75 mg every 14 days.  Preoperative cardiovascular examination Mr. Rizzo perioperative risk of a major cardiac event is 0.9% according to the Revised Cardiac Risk Index (RCRI).  Therefore, he is at low risk for perioperative complications.    Recommendations: According to ACC/AHA guidelines, no further cardiovascular testing needed.  The patient may proceed to surgery at acceptable risk.   Antiplatelet and/or Anticoagulation Recommendations: Aspirin can be held for  7 days prior to his surgery.  Please resume Aspirin post operatively when it is felt to be safe from a bleeding standpoint.   Essential hypertension Blood pressure above target.  Increase amlodipine to 10 mg daily.  Send blood pressure readings for review in 2 weeks.  If blood pressure remains uncontrolled, consider adding ACE/ARB.  Hyperlipidemia LDL in February 69.  Continue Praluent 75 mg every 2 weeks, Zetia 10 mg daily.  BPPV (benign paroxysmal positional vertigo) He notes a long history of positional vertigo.  I will refer him to vestibular rehab.        Dispo:  Return in about 6 months (around 11/14/2022) for Routine Follow Up, w/ Dr. Excell Seltzer, or Tereso Newcomer, PA-C.   Medication Adjustments/Labs and Tests Ordered: Current medicines are reviewed at length with the patient today.  Concerns regarding medicines are outlined above.  Tests Ordered: Orders Placed This Encounter  Procedures   Ambulatory referral to Cardiothoracic Surgery   Ambulatory referral to Physical Therapy   Medication Changes: Meds ordered this encounter  Medications   amLODipine (NORVASC) 10 MG tablet    Sig: Take 1 tablet (10 mg total) by mouth daily.    Dispense:  90 tablet    Refill:  3   Signed, Tereso Newcomer, PA-C  05/15/2022 9:41 AM    [facility] [address] [place], [place], [place]  [address] Phone: [phone]; Fax: [phone]

## 2022-05-15 ENCOUNTER — Ambulatory Visit: Payer: BC Managed Care – PPO | Attending: Physician Assistant | Admitting: Physician Assistant

## 2022-05-15 ENCOUNTER — Encounter: Payer: Self-pay | Admitting: Physician Assistant

## 2022-05-15 VITALS — BP 144/100 | HR 94 | Ht 69.0 in | Wt 194.6 lb

## 2022-05-15 DIAGNOSIS — E7800 Pure hypercholesterolemia, unspecified: Secondary | ICD-10-CM

## 2022-05-15 DIAGNOSIS — G8918 Other acute postprocedural pain: Secondary | ICD-10-CM

## 2022-05-15 DIAGNOSIS — I10 Essential (primary) hypertension: Secondary | ICD-10-CM | POA: Diagnosis not present

## 2022-05-15 DIAGNOSIS — R0789 Other chest pain: Secondary | ICD-10-CM

## 2022-05-15 DIAGNOSIS — R079 Chest pain, unspecified: Secondary | ICD-10-CM

## 2022-05-15 DIAGNOSIS — Z01810 Encounter for preprocedural cardiovascular examination: Secondary | ICD-10-CM | POA: Diagnosis not present

## 2022-05-15 DIAGNOSIS — H8110 Benign paroxysmal vertigo, unspecified ear: Secondary | ICD-10-CM

## 2022-05-15 DIAGNOSIS — I2510 Atherosclerotic heart disease of native coronary artery without angina pectoris: Secondary | ICD-10-CM

## 2022-05-15 MED ORDER — AMLODIPINE BESYLATE 10 MG PO TABS: 10.0000 mg | ORAL_TABLET | Freq: Every day | ORAL | 3 refills | Status: DC

## 2022-05-15 NOTE — Addendum Note (Signed)
Addended by: Alvin Critchley A on: 05/15/2022 03:59 PM   Modules accepted: Orders

## 2022-05-15 NOTE — Patient Instructions (Addendum)
Medication Instructions:  Your physician has recommended you make the following change in your medication:   Increase amlodipine to 10mg  daily  Hold aspirin x 7days before surgery and resume when advised by surgeon *If you need a refill on your cardiac medications before your next appointment, please call your pharmacy*  Follow-Up: At [facility], you and your health needs are our priority.  As part of our continuing mission to provide you with exceptional heart care, we have created designated Provider Care Teams.  These Care Teams include your primary Cardiologist (physician) and Advanced Practice Providers (APPs -  Physician Assistants and Nurse Practitioners) who all work together to provide you with the care you need, when you need it.    Your next appointment:   6 month(s)  The format for your next appointment:   In Person  Provider:   [facility], PA-C        Other Instructions You have been referred to Cardiothoracic surgery for evaluation of chest wall pain.    You have been referred to Vestibular Rehab.    Important Information About Sugar

## 2022-05-15 NOTE — Assessment & Plan Note (Signed)
Hx of CABG in 2020. He has had a lot of chest symptoms since his surgery.  Recent Myoview low risk.  Echocardiogram demonstrated normal LV function.  I will refer him back to cardiothoracic surgery to see if there is anything anatomically that could be done to help alleviate his chest pain.  If not, we may need to consider sending him to pain management or physical therapy.  Continue aspirin 81 g daily, Praluent 75 mg every 14 days.

## 2022-05-15 NOTE — Assessment & Plan Note (Signed)
Blood pressure above target.  Increase amlodipine to 10 mg daily.  Send blood pressure readings for review in 2 weeks.  If blood pressure remains uncontrolled, consider adding ACE/ARB.

## 2022-05-15 NOTE — Assessment & Plan Note (Addendum)
Mr. Danny Brewer perioperative risk of a major cardiac event is 0.9% according to the Revised Cardiac Risk Index (RCRI).  Therefore, he is at low risk for perioperative complications.    Recommendations: According to ACC/AHA guidelines, no further cardiovascular testing needed.  The patient may proceed to surgery at acceptable risk.   Antiplatelet and/or Anticoagulation Recommendations: Aspirin can be held for 7 days prior to his surgery.  Please resume Aspirin post operatively when it is felt to be safe from a bleeding standpoint.

## 2022-05-15 NOTE — Assessment & Plan Note (Signed)
LDL in February 69.  Continue Praluent 75 mg every 2 weeks, Zetia 10 mg daily.

## 2022-05-15 NOTE — Assessment & Plan Note (Signed)
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  I will refer him to vestibular rehab.

## 2022-05-23 ENCOUNTER — Ambulatory Visit
Admission: RE | Admit: 2022-05-23 | Discharge: 2022-05-23 | Disposition: A | Discharge status: Home / Self Care | Payer: BC Managed Care – PPO | Source: Ambulatory Visit | Attending: Neurology | Admitting: Neurology

## 2022-05-23 DIAGNOSIS — G4452 New daily persistent headache (NDPH): Secondary | ICD-10-CM | POA: Diagnosis not present

## 2022-05-25 ENCOUNTER — Telehealth: Payer: Self-pay | Admitting: *Deleted

## 2022-05-25 MED ORDER — AZITHROMYCIN 250 MG PO TABS: ORAL_TABLET | ORAL | 0 refills | Status: DC

## 2022-05-25 NOTE — Telephone Encounter (Signed)
-----   Message from Asa Lente, MD sent at 05/25/2022  1:26 PM EST ----- Please let him know that the MRI is mostly unchanged compared to his previous one.  It shows the artery is pushing on the nerve causing the facial twitching.  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  Lets send in a Z-Pak.

## 2022-05-25 NOTE — Telephone Encounter (Signed)
Called pt. Relayed results per Dr. Bonnita Hollow note. He verbalized understanding. E-scribed zpak to [facility] [address]. He will f/u with PCP or ENT if ineffective.

## 2022-05-28 ENCOUNTER — Ambulatory Visit (HOSPITAL_COMMUNITY)
Admission: RE | Admit: 2022-05-28 | Discharge: 2022-05-28 | Disposition: A | Discharge status: Home / Self Care | Payer: BC Managed Care – PPO | Source: Ambulatory Visit | Attending: Physician Assistant | Admitting: Physician Assistant

## 2022-05-28 DIAGNOSIS — G8918 Other acute postprocedural pain: Secondary | ICD-10-CM | POA: Diagnosis present

## 2022-05-28 DIAGNOSIS — R079 Chest pain, unspecified: Secondary | ICD-10-CM | POA: Diagnosis present

## 2022-05-28 DIAGNOSIS — R0789 Other chest pain: Secondary | ICD-10-CM | POA: Insufficient documentation

## 2022-06-09 ENCOUNTER — Ambulatory Visit: Payer: BC Managed Care – PPO | Admitting: Neurology

## 2022-06-11 ENCOUNTER — Ambulatory Visit: Payer: BC Managed Care – PPO | Admitting: Cardiothoracic Surgery

## 2022-06-11 ENCOUNTER — Telehealth: Payer: Self-pay | Admitting: Neurology

## 2022-06-11 NOTE — Telephone Encounter (Signed)
Called [facility] again to check status, they are still waiting to hear back from the patient.

## 2022-06-11 NOTE — Telephone Encounter (Signed)
Called to schedule delivery for 1/8 Botox appt, shipment is pending pt consent. Pt stated he would call [facility] within the next hour. Will try calling to schedule delivery later today.

## 2022-06-12 ENCOUNTER — Encounter: Payer: BC Managed Care – PPO | Admitting: Thoracic Surgery (Cardiothoracic Vascular Surgery)

## 2022-06-12 ENCOUNTER — Encounter: Payer: Self-pay | Admitting: Cardiothoracic Surgery

## 2022-06-12 NOTE — Telephone Encounter (Signed)
Called [facility] to check status, they have still not heard back from pt. They tried giving him a call and went straight to voicemail. I sent another message asking him to reach out.

## 2022-06-15 ENCOUNTER — Ambulatory Visit (INDEPENDENT_AMBULATORY_CARE_PROVIDER_SITE_OTHER): Payer: BC Managed Care – PPO | Admitting: Neurology

## 2022-06-15 DIAGNOSIS — E119 Type 2 diabetes mellitus without complications: Secondary | ICD-10-CM | POA: Diagnosis not present

## 2022-06-15 DIAGNOSIS — G5132 Clonic hemifacial spasm, left: Secondary | ICD-10-CM | POA: Diagnosis not present

## 2022-06-15 DIAGNOSIS — I10 Essential (primary) hypertension: Secondary | ICD-10-CM | POA: Diagnosis not present

## 2022-06-15 MED ORDER — ONABOTULINUMTOXINA 100 UNITS IJ SOLR
100.0000 [IU] | Freq: Once | INTRAMUSCULAR | Status: AC
  Administered 2022-06-15: 60 [IU] via INTRAMUSCULAR

## 2022-06-15 NOTE — Progress Notes (Signed)
Botox- 100 units x 1 vial Lot: [id number] Expiration: 09/2024 NDC: 9024-0973-53  Bacteriostatic 0.9% Sodium Chloride- 47mL total Lot: [id number] Expiration: 07/16/2022 NDC: 8341-9622-29  Dx: N98.92 S/P

## 2022-06-15 NOTE — Progress Notes (Signed)
[facility]  PATIENT: Danny Brewer DOB: 06-16-1970  REFERRING DOCTOR OR PCP:  Lynnea Ferrier is PCP; referred by Jene Every SOURCE: Patient, notes from Dr. Shelle Iron, multiple MRI and CT reports and images on PACS  _________________________________   HISTORICAL  CHIEF COMPLAINT:  Chief Complaint  Patient presents with   Injections    Pt alone, rm 10. Here for botox    HISTORY OF PRESENT ILLNESS:  Update 06/15/2022 In early October, he had his first series of Botox injections for left hemifacial spasms.  He reports excellent results with no significant spasming over the last 3 months.  He tolerated the injections well and had no weakness afterwards.  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   Oral medications had not helped and we will do Botox.  If no benefit also consider referral to Neurosurgery for microvascular decompression.    Before the Botox, he had episodes of the left jaw tightening up for 30-90 minutes about once a month,  This is painful  like intense pins and needles.  Sometimes his happene after eating .  He also gets multiple small episodes of left facial twitch every day (several / hr on average).  THe smaller episodes are not painful.       Vascular risks:  Type 2 NIDDM (since 2018), HTN, elevated cholesterol.      He denies any difficulty with gait, balance, numbness or strength now.   He sleeps well most nights.  He tries to  Live a stress free life.    No depression or anxiety.      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    MRI of the cervical spine 03/11/2017. It showed degenerative changes at C4-C5, C5-C6 and C6-C7 but also showed a small T2 hyperintense focus  within the right dorsolateral pons. He was referred for further evaluation.  MRI of the brain dated 08/25/2015 and 02/23/2010 show a stable 3-4 mm focus in the posterior right pons near the middle cerebellar peduncle. It is unchanged in size or signal characteristics.  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     MRI brain 04/22/2020 showed just one T2/FLAIR hyperintense focus in the right posterior pons, present in 2017 and one left temporal chronic microhemorrhage, not present in 2017     REVIEW OF SYSTEMS: Constitutional: No fevers, chills, sweats, or change in appetite.   He has excessive sleepiness Eyes: No visual changes, double vision, eye pain Ear, nose and throat: No hearing loss, ear pain, nasal congestion, sore throat Cardiovascular: No chest pain, palpitations Respiratory:  No shortness of breath at rest or with exertion.   No wheezes.  He snores GastrointestinaI: No nausea, vomiting, diarrhea, abdominal pain, fecal incontinence Genitourinary:  No dysuria, urinary retention or frequency.  No nocturia. Musculoskeletal:  No neck pain, back pain Integumentary: No rash, pruritus, skin lesions Neurological: as above Psychiatric: No depression at this time.  No anxiety Endocrine: No palpitations, diaphoresis, change in appetite, change in weigh or increased thirst Hematologic/Lymphatic:  No anemia, purpura, petechiae. Allergic/Immunologic: No itchy/runny eyes, nasal congestion, recent allergic reactions, rashes  ALLERGIES: Allergies  Allergen Reactions   Antihistamines, Diphenhydramine-Type Hives and Other (See Comments)    Seizures  Bactrim [Sulfamethoxazole-Trimethoprim] Itching    Throat itching and suspected pill esophagitis   Demerol Hives   Morphine And Related Hives    Esp with high dose morphine, possibly Dilaudid Tolerates hydrocodone, oxycodone   Compazine [Prochlorperazine] Other (See Comments)    Spastic movement   Metoprolol     Per patient caused "severe  headache"   Statins Other (See Comments)    myalgias    HOME MEDICATIONS:  Current Outpatient Medications:    acetaminophen (TYLENOL) 500 MG tablet, Take 2 tablets (1,000 mg total) by mouth every 6 (six) hours., Disp: 50 tablet, Rfl: 0   Alirocumab (PRALUENT) 75 MG/ML SOAJ, INJECT 1 ML INTO THE SKIN EVERY 14 (FOURTEEN) DAYS., Disp: 2 mL, Rfl: 11   amLODipine (NORVASC) 10 MG tablet, Take 1 tablet (10 mg total) by mouth daily., Disp: 90 tablet, Rfl: 3   aspirin 81 MG tablet, Take 1 tablet (81 mg total) by mouth daily., Disp: , Rfl:    azithromycin (ZITHROMAX) 250 MG tablet, Take 2 tablets on day one, then take 1 tablet daily after that until finished, Disp: 6 each, Rfl: 0   botulinum toxin Type A (BOTOX) 100 units SOLR injection, Inject 100 Units into the muscle every 3 (three) months. To be administered by MD., Disp: 1 each, Rfl: 0   ezetimibe (ZETIA) 10 MG tablet, Take 1 tablet (10 mg total) by mouth daily., Disp: 90 tablet, Rfl: 2   methocarbamol (ROBAXIN) 500 MG tablet, Take 500 mg by mouth at bedtime., Disp: , Rfl:    predniSONE (DELTASONE) 20 MG tablet, Take 20 mg by mouth as needed (pain)., Disp: , Rfl:    sitaGLIPtin (JANUVIA) 100 MG tablet, Take 100 mg by mouth daily., Disp: , Rfl:    tamsulosin (FLOMAX) 0.4 MG CAPS capsule, Take 0.4 mg by mouth at bedtime., Disp: , Rfl:   Current Facility-Administered Medications:    botulinum toxin Type A (BOTOX) injection 100 Units, 100 Units, Intramuscular, Once, Saivion Goettel, Pearletha Furl, MD  PAST MEDICAL HISTORY: Past Medical History:  Diagnosis Date   BPH (benign prostatic hyperplasia)    Carotid artery disease (HCC)    [place] 5/22: Bilat 1-39    Complication of anesthesia    Pt verble and "prophecizes" after anesthesia   Coronary Artery Disease cardiologist--- dr cooper   Coronary CTA 09/2018: pLAD 70-90; pD1+pD2 70-90; mRCA mixed plaque - cannot rule out 70-90; FFR suggests hemodynamically significant stenosis // LHC 10/2018: 2v CAD >> s/p  CABG  (L-LAD, L radial-RI, S-D2, S-RCA) with Dr. 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   LIMA--LAD;  SVG--D2;  SVG--dRCA;  left radial --- RI   Type 2 diabetes mellitus (HCC)    followed by pcp   (10-01-2020 checks blood sugar 3 times wkly,  fasting sugar-- 72--105)   Wears glasses     PAST SURGICAL HISTORY: Past Surgical History:  Procedure Laterality Date   CORONARY ARTERY BYPASS GRAFT N/A 10/17/2018   Procedure: CORONARY ARTERY BYPASS GRAFTING (CABG), ON PUMP, TIMES FOUR, USING LIMA TO LAD, ENDOSCOPICALLY HARVESTED GREATER SAPHENOUS VEIN TO DIAG 2, SVG TO DISTAL RCA, AND LEFT RADIAL TO RAMUS INTERMEDIUS;  Surgeon: [name], MD;  Location: [facility] OR;  Service: Open Heart Surgery;  Laterality: N/A;   CYSTOSCOPY WITH RETROGRADE PYELOGRAM, URETEROSCOPY AND STENT PLACEMENT Right 12/30/2021   Procedure: CYSTOSCOPY WITH RETROGRADE PYELOGRAM, URETEROSCOPY AND STENT PLACEMENT, BASKET OF STONES;  Surgeon: Sebastian Ache, MD;  Location: [facility];  Service: Urology;  Laterality: Right;   ENDOVEIN HARVEST OF GREATER SAPHENOUS VEIN Right 10/17/2018   Procedure: Mack Guise Of Greater Saphenous Vein;  Surgeon: [name], MD;  Location: [facility];  Service: Open Heart Surgery;  Laterality: Right;   HAND SURGERY Left 1987   complex laceration   INGUINAL HERNIA REPAIR Bilateral 05/27/2015   Procedure: LAPAROSCOPIC BILATERAL  INGUINAL HERNIA REPAIRS WITH MESH;  Surgeon: Karie Soda, MD;  Location: [facility];  Service: General;  Laterality: Bilateral;   KNEE ARTHROSCOPY Left 1991   LEFT HEART CATH AND CORONARY ANGIOGRAPHY N/A 10/14/2018   Procedure: LEFT HEART CATH AND CORONARY ANGIOGRAPHY;  Surgeon: Lyn Records, MD;  Location: [facility];  Service: Cardiovascular;  Laterality:  N/A;   PENILE PROSTHESIS IMPLANT N/A 10/03/2020   Procedure: PLACEMENT OF PENILE PROTHESIS INFLATABLE;  Surgeon: Marcine Matar, MD;  Location: [facility];  Service: Urology;  Laterality: N/A;  pubis area   RADIAL ARTERY HARVEST Left 10/17/2018   Procedure: RADIAL ARTERY HARVEST;  Surgeon: [name], MD;  Location: [facility] OR;  Service: Open Heart Surgery;  Laterality: Left;   REMOVAL OF PENILE PROSTHESIS N/A 07/07/2021   Procedure: EXPLANT  AND REPLACEMENT OF COLOPLAST TITAN PENILE PROSTHESIS;  Surgeon: Marcine Matar, MD;  Location: [facility];  Service: Urology;  Laterality: N/A;   SCROTAL EXPLORATION N/A 07/07/2021   Procedure: EXPLORATION OF PENILE PROSTHESIS;  Surgeon: Marcine Matar, MD;  Location: [facility];  Service: Urology;  Laterality: N/A;   SHOULDER CLOSED REDUCTION Left 09/04/2019   Procedure: CLOSED MANIPULATION SHOULDER;  Surgeon: Dannielle Huh, MD;  Location: [facility];  Service: Orthopedics;  Laterality: Left;   SHOULDER SURGERY Left 2022   TEE WITHOUT CARDIOVERSION N/A 10/17/2018   Procedure: TRANSESOPHAGEAL ECHOCARDIOGRAM (TEE);  Surgeon: [name], MD;  Location[facility] OR;  Service: Open Heart Surgery;  Laterality: N/A;    FAMILY HISTORY: Family History  Problem Relation Age of Onset   Hypertension Mother    Cancer Father    Heart failure Father    Sudden death Brother    Heart attack Brother    Hypertension Sister    Heart murmur Sister     SOCIAL HISTORY:  Social History   Socioeconomic History   Marital status: Married    Spouse name: Not on file   Number of children: Not on file   Years of education: 14   Highest education level: Not on file  Occupational History   Occupation: [employer]  Tobacco Use   Smoking status: Never   Smokeless tobacco: Never  Vaping Use   Vaping Use: Never used  Substance and Sexual Activity   Alcohol use: No   Drug use: Never   Sexual activity: Not on file  Other Topics Concern   Not on  file  Social History Narrative   Right handed   Caffeine use: 1 tea/day   Social Determinants of Health   Financial Resource Strain: Not on file  Food Insecurity: Not on file  Transportation Needs: Not on file  Physical Activity: Not on file  Stress: Not on file  Social Connections: Not on file  Intimate Partner Violence: Not on file     PHYSICAL EXAM  There were no vitals filed for this visit.   There is no height or weight on file to calculate BMI.   General: The patient is well-developed and well-nourished and in no acute distress.  Pharynx is Mallampati 2  Neck:   The neck is nontender with slightly reduced ROM.    Skin: Extremities are without significant edema.     Musculoskeletal:  Back is nontender  Neurologic Exam  Mental status: The patient is alert and oriented x 3 at the time of the examination. The patient has apparent normal recent and remote memory, with an apparently normal attention span and concentration ability.   Speech is normal.  Cranial nerves: Extraocular movements are full.  Facial strength and sensation is normal.  Hearing is normal  Motor:  Muscle bulk is normal.   Tone is normal. Strength is  5 / 5 in all 4 extremities.   Sensory: Sensory testing is intact to pinprick, soft touch and vibration sensation in all 4 extremities.  Coordination: Cerebellar testing reveals good finger-nose-finger and heel-to-shin bilaterally.  Gait and station: Station is normal.   Gait is normal. Tandem gait is normal. Romberg is negative.   Reflexes: Deep tendon reflexes are 3 at knees and 2 elsewhere.         DIAGNOSTIC DATA (LABS, IMAGING, TESTING) - I reviewed patient records, labs, notes, testing and imaging myself where available.  Lab Results  Component Value Date   WBC 6.0 02/27/2022   HGB 13.1 02/27/2022   HCT 41.1 02/27/2022   MCV 88.4 02/27/2022   PLT 282 02/27/2022      Component Value Date/Time   NA 140 02/27/2022 1655   NA 140  07/23/2021 0920   K 3.6 02/27/2022 1655   CL 108 02/27/2022 1655   CO2 26 02/27/2022 1655   GLUCOSE 94 02/27/2022 1655   BUN 10 02/27/2022 1655   BUN 18 07/23/2021 0920   CREATININE 1.12 02/27/2022 1655   CREATININE 0.93 12/18/2014 1157   CALCIUM 8.6 (L) 02/27/2022 1655   PROT 6.9 12/30/2021 0922   PROT 7.0 07/23/2021 0920   ALBUMIN 4.3 12/30/2021 0922   ALBUMIN 4.5 07/23/2021 0920   AST 16 12/30/2021 0922   ALT 12 12/30/2021 0922   ALKPHOS 55 12/30/2021 0922   BILITOT 0.4 12/30/2021 0922   BILITOT 0.3 07/23/2021 0920   GFRNONAA >60 02/27/2022 1655   GFRNONAA >89 12/18/2014 1157   GFRAA >60 11/09/2019 0854   GFRAA >89 12/18/2014 1157          ASSESSMENT AND PLAN  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    If the Botox stops being effective for the hemifacial spasms, we could consider referral to neurosurgery for the vascular decompression 3.    Rtc 3 months or sooner if new or worsening symptoms  Wanda Rideout A. Felecia Shelling, MD, [facility] 12/09/5327, 9:24 PM Certified in Neurology, Clinical Neurophysiology, Sleep Medicine, Pain Medicine and Neuroimaging  [facility]  [address], [address], [place] [address] [phone]

## 2022-06-15 NOTE — Telephone Encounter (Signed)
Botox is TBD 1/9.

## 2022-06-16 NOTE — Telephone Encounter (Signed)
Received Botox, replaced office stock used for 1/8 appt.

## 2022-06-17 ENCOUNTER — Other Ambulatory Visit: Payer: Self-pay | Admitting: Urology

## 2022-06-17 ENCOUNTER — Telehealth: Payer: Self-pay | Admitting: Cardiovascular Disease

## 2022-06-17 NOTE — Telephone Encounter (Signed)
S/w pt is schedule pre op appt on Monday, Jan 15 with Raeford Razor.

## 2022-06-17 NOTE — Telephone Encounter (Signed)
   Pre-operative Risk Assessment    Patient Name: Danny Brewer  DOB: Aug 17, 1970 MRN: [id number]      Request for Surgical Clearance    Procedure:   Ureteroscopy with laser lithotripsy    Date of Surgery:  Clearance 07/10/22                                 Surgeon:  Dr. Rip Harbour Group or Practice Name:  [facility]   Phone number:  [phone] Fax number:  [phone]    Type of Clearance Requested:   - Medical  - Pharmacy:  Hold Aspirin 5 days prior    Type of Anesthesia:  General    Additional requests/questions:    Dorthey Sawyer   06/17/2022, 1:26 PM

## 2022-06-17 NOTE — Telephone Encounter (Signed)
   Name: Danny Brewer  DOB: 1970/08/31  MRN: [id number]  Primary Cardiologist: Sherren Mocha, MD  Chart reviewed as part of pre-operative protocol coverage. Because of Danny Brewer's past medical history and time since last visit, he will require a follow-up in-office visit in order to better assess preoperative cardiovascular risk.  Pre-op covering staff: - Please schedule appointment and call patient to inform them. If patient already had an upcoming appointment within acceptable timeframe, please add "pre-op clearance" to the appointment notes so provider is aware. - Please contact requesting surgeon's office via preferred method (i.e, phone, fax) to inform them of need for appointment prior to surgery.  If patient was asymptomatic at the time of his appointment, he may hold his aspirin x 5 days.  History of CABG 10/2018.  Danny Collard, PA-C  06/17/2022, 1:54 PM

## 2022-06-21 NOTE — Progress Notes (Unsigned)
Office Visit    Patient Name: Danny Brewer Date of Encounter: 06/21/2022  PCP:  Lurline Del, [facility]  Cardiologist:  Sherren Mocha, MD  Advanced Practice Provider:  Liliane Shi, PA-C Electrophysiologist:  None   HPI    Danny Brewer is a 52 y.o. male with past medical history significant for coronary artery disease status post CABG 10/2018, reduced EF on Myoview 03/20/2022 but preserved EF on echocardiogram 03/25/2022 (EF 60 to 65%, no RWMA, normal RV SF, trivial MR, diabetes mellitus type 2, hypertension, hyperlipidemia (intolerant to statins and on Praluent), carotid stenosis (ultrasound 10/21/2020 with bilateral ICA 1 to 39% and updated ultrasound 03/21/2022 with no ICA stenosis bilaterally), ED, nephrolithiasis presents today for preop evaluation.  He was last seen 05/15/2022 and at that time he returned for a follow-up appointment after having a recent emergency room visit 9/23 for chest pain.  Lexiscan Myoview demonstrated no ischemia.  EF was 48%.  He needed to undergo right shoulder arthroplasty with Dr. Onnie Graham 07/03/2022.  This particular appointment is for surgery 07/10/2022 which is ureteroscopy with laser lithotripsy.   He was having a lot of chest symptoms since his surgery in 2020.  Recent Myoview was low risk.  Echocardiogram demonstrated normal LV function.  Referred back to CT surgery to see if there was anything anatomically that could be done to help alleviate his chest pain.  Otherwise consider pain management for pain.  Continue to limit risk factors and continue on medications including aspirin 81 mg daily, Praluent 75 mg every 14 days.  Today, he *** Past Medical History    Past Medical History:  Diagnosis Date   BPH (benign prostatic hyperplasia)    Carotid artery disease ([place])    [place] 5/22: Bilat 0-25    Complication of anesthesia    Pt verble and "prophecizes" after anesthesia   Coronary Artery Disease cardiologist--- dr  cooper   Coronary CTA 09/2018: pLAD 70-90; pD1+pD2 70-90; mRCA mixed plaque - cannot rule out 70-90; FFR suggests hemodynamically significant stenosis // LHC 10/2018: 2v CAD >> s/p  CABG (L-LAD, L radial-RI, S-D2, S-RCA) with Dr. 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   LIMA--LAD;  SVG--D2;  SVG--dRCA;  left radial --- RI   Type 2 diabetes mellitus ([place])    followed by pcp   (10-01-2020 checks blood sugar 3 times wkly,  fasting sugar-- 72--105)   Wears glasses    Past Surgical History:  Procedure Laterality Date   CORONARY ARTERY BYPASS GRAFT N/A 10/17/2018   Procedure: CORONARY ARTERY BYPASS GRAFTING (CABG), ON PUMP, TIMES FOUR, USING LIMA TO LAD, ENDOSCOPICALLY HARVESTED GREATER SAPHENOUS VEIN TO DIAG 2, SVG TO DISTAL RCA, AND LEFT RADIAL TO RAMUS INTERMEDIUS;  Surgeon: Grace Isaac, MD;  Location: [facility];  Service: Open Heart Surgery;  Laterality: N/A;   CYSTOSCOPY WITH RETROGRADE PYELOGRAM, URETEROSCOPY AND STENT PLACEMENT Right 12/30/2021   Procedure: CYSTOSCOPY WITH RETROGRADE PYELOGRAM, URETEROSCOPY AND STENT PLACEMENT, BASKET OF STONES;  Surgeon: Alexis Frock, MD;  Location: [facility];  Service: Urology;  Laterality: Right;   ENDOVEIN HARVEST OF GREATER SAPHENOUS VEIN Right 10/17/2018   Procedure: [place] Greater Saphenous  Vein;  Surgeon: Grace Isaac, MD;  Location: [facility];  Service: Open Heart Surgery;  Laterality: Right;   HAND SURGERY Left 1987   complex laceration   INGUINAL HERNIA REPAIR Bilateral 05/27/2015   Procedure: LAPAROSCOPIC BILATERAL  INGUINAL HERNIA REPAIRS WITH MESH;  Surgeon: Michael Boston, MD;  Location: [facility];  Service: General;  Laterality: Bilateral;   KNEE ARTHROSCOPY Left 1991   LEFT HEART CATH AND CORONARY ANGIOGRAPHY N/A  10/14/2018   Procedure: LEFT HEART CATH AND CORONARY ANGIOGRAPHY;  Surgeon: Belva Crome, MD;  Location: [facility];  Service: Cardiovascular;  Laterality: N/A;   PENILE PROSTHESIS IMPLANT N/A 10/03/2020   Procedure: PLACEMENT OF PENILE PROTHESIS INFLATABLE;  Surgeon: Franchot Gallo, MD;  Location: [facility];  Service: Urology;  Laterality: N/A;  pubis area   RADIAL ARTERY HARVEST Left 10/17/2018   Procedure: RADIAL ARTERY HARVEST;  Surgeon: Grace Isaac, MD;  Location: [facility];  Service: Open Heart Surgery;  Laterality: Left;   REMOVAL OF PENILE PROSTHESIS N/A 07/07/2021   Procedure: EXPLANT  AND REPLACEMENT OF COLOPLAST TITAN PENILE PROSTHESIS;  Surgeon: Franchot Gallo, MD;  Location: [facility];  Service: Urology;  Laterality: N/A;   SCROTAL EXPLORATION N/A 07/07/2021   Procedure: EXPLORATION OF PENILE PROSTHESIS;  Surgeon: Franchot Gallo, MD;  Location: [facility];  Service: Urology;  Laterality: N/A;   SHOULDER CLOSED REDUCTION Left 09/04/2019   Procedure: CLOSED MANIPULATION SHOULDER;  Surgeon: Vickey Huger, MD;  Location: [facility];  Service: Orthopedics;  Laterality: Left;   SHOULDER SURGERY Left 2022   TEE WITHOUT CARDIOVERSION N/A 10/17/2018   Procedure: TRANSESOPHAGEAL ECHOCARDIOGRAM (TEE);  Surgeon: Grace Isaac, MD;  Location: [facility];  Service: Open Heart Surgery;  Laterality: N/A;    Allergies  Allergies  Allergen Reactions   Antihistamines, Diphenhydramine-Type Hives and Other (See Comments)    Seizures   Bactrim [Sulfamethoxazole-Trimethoprim] Itching    Throat itching and suspected pill esophagitis   Demerol Hives   Morphine And Related Hives    Esp with high dose morphine, possibly Dilaudid Tolerates hydrocodone, oxycodone   Compazine [Prochlorperazine] Other (See Comments)    Spastic movement   Metoprolol     Per patient caused "severe headache"   Statins Other (See Comments)    myalgias    EKGs/Labs/Other Studies Reviewed:    The following studies were reviewed today:  Lexiscan Myoview 03/20/2022    The study is normal. The study is low risk.   No ST deviation was noted.   LV perfusion is normal. There is no evidence of ischemia. There is no evidence of infarction.   Left ventricular function is abnormal. Global function is mildly reduced. Nuclear stress EF: 48 %. The left ventricular ejection fraction is mildly decreased (45-54%). End diastolic cavity size is normal. End systolic cavity size is normal.   Prior study not available for comparison.   Normal perfusion Mild systolic dysfunction (EF [id number]) with septal hypokinesis.  Consider echocardiogram for further evaluation Low risk study  Echo 03/25/22  IMPRESSIONS     1. Left ventricular ejection fraction, by estimation, is 60 to 65%. The  left ventricle has normal function. The left ventricle has no regional  wall motion abnormalities. Left ventricular diastolic parameters were  normal.   2. Right ventricular systolic function is normal. The right ventricular  size is normal.   3. The mitral valve is normal in structure. Trivial mitral valve  regurgitation.   4. The aortic valve is tricuspid. Aortic  valve regurgitation is not  visualized.   FINDINGS   Left Ventricle: Left ventricular ejection fraction, by estimation, is 60  to 65%. The left ventricle has normal function. The left ventricle has no  regional wall motion abnormalities. The left ventricular internal cavity  size was normal in size. There is   no left ventricular hypertrophy. Left ventricular diastolic parameters  were normal.   Right Ventricle: The right ventricular size is normal. Right vetricular  wall thickness was not assessed. Right ventricular systolic function is  normal.   Left Atrium: Left atrial size was normal in size.   Right Atrium: Right atrial size was normal in size.   Pericardium: There is no evidence of pericardial effusion.   Mitral Valve: The mitral  valve is normal in structure. Trivial mitral  valve regurgitation.   Tricuspid Valve: The tricuspid valve is normal in structure. Tricuspid  valve regurgitation is trivial.   Aortic Valve: The aortic valve is tricuspid. Aortic valve regurgitation is  not visualized.   Pulmonic Valve: The pulmonic valve was normal in structure. Pulmonic valve  regurgitation is mild.   Aorta: The aortic root and ascending aorta are structurally normal, with  no evidence of dilitation.   IAS/Shunts: No atrial level shunt detected by color flow Doppler.   EKG:  EKG is *** ordered today.  The ekg ordered today demonstrates ***  Recent Labs: 12/30/2021: ALT 12 02/27/2022: BUN 10; Creatinine, Ser 1.12; Hemoglobin 13.1; Platelets 282; Potassium 3.6; Sodium 140  Recent Lipid Panel    Component Value Date/Time   CHOL 136 07/23/2021 0920   TRIG 50 07/23/2021 0920   HDL 56 07/23/2021 0920   CHOLHDL 2.4 07/23/2021 0920   CHOLHDL 4.3 09/19/2018 0243   VLDL 13 09/19/2018 0243   LDLCALC 69 07/23/2021 0920    Risk Assessment/Calculations:  {Does this patient have ATRIAL FIBRILLATION?:[phone]}  Home Medications   No outpatient medications have been marked as taking for the 06/22/22 encounter (Appointment) with Sharlene Dory, PA-C.     Review of Systems   ***   All other systems reviewed and are otherwise negative except as noted above.  Physical Exam    VS:  There were no vitals taken for this visit. , BMI There is no height or weight on file to calculate BMI.  Wt Readings from Last 3 Encounters:  05/15/22 194 lb 9.6 oz (88.3 kg)  03/20/22 194 lb (88 kg)  03/09/22 194 lb (88 kg)     GEN: Well nourished, well developed, in no acute distress. HEENT: normal. Neck: Supple, no JVD, carotid bruits, or masses. Cardiac: ***RRR, no murmurs, rubs, or gallops. No clubbing, cyanosis, edema.  ***Radials/PT 2+ and equal bilaterally.  Respiratory:  ***Respirations regular and unlabored, clear to  auscultation bilaterally. GI: Soft, nontender, nondistended. MS: No deformity or atrophy. Skin: Warm and dry, no rash. Neuro:  Strength and sensation are intact. Psych: Normal affect.  Assessment & Plan    Preop Eval {Click Here to Calculate RCRI      :[id number]}  { Click Here to Calculate DASI      :678938101} {Select to add RCRI Risk (<1%=LOW; >/=1%=HIGH) (Optional):21036017}  {Select if HIGH (RCRI >/=1%) Risk (Optional):21036030} Recommendations: {2014 ACC/AHA Perioperative Guidelines  :21036001} Antiplatelet and/or Anticoagulation Recommendations: {Antiplatelet Recommendations                  :21036016} {Anticoagulation Recommendations           :75102585}  CAD s/p CABG 2020 Essential HTN  HLD BPPV  No BP recorded.  {Refresh Note OR Click here to enter BP  :1}***      Disposition: Follow up {follow up:15908} with Sherren Mocha, MD or APP.  Signed, Elgie Collard, PA-C 06/21/2022, 3:56 PM [facility]

## 2022-06-21 NOTE — Progress Notes (Signed)
[address]       [place] [address]             [phone]                    Reeve Turnley [facility] Medical Record #[id number] Date of Birth: [birth year]  Referring: Jackelyn Poling, DO Primary Care: Jackelyn Poling, DO Primary Cardiologist: Tonny Bollman, MD  Chief Complaint:   No chief complaint on file.   History of Present Illness:    Danny Brewer 52 y.o. male here for evaluation of chest pain s/p CABG (by Dr. )   Per cardiology HPI: Danny Brewer is a 52 y.o. male with past medical history significant for coronary artery disease status post CABG 10/2018, reduced EF on Myoview 03/20/2022 but preserved EF on echocardiogram 03/25/2022 (EF 60 to 65%, no RWMA, normal RV SF, trivial MR, diabetes mellitus type 2, hypertension, hyperlipidemia (intolerant to statins and on Praluent), carotid stenosis (ultrasound 10/21/2020 with bilateral ICA 1 to 39% and updated ultrasound 03/21/2022 with no ICA stenosis bilaterally), ED, nephrolithiasis presents today for preop evaluation.   He was last seen 05/15/2022 and at that time he returned for a follow-up appointment after having a recent emergency room visit 9/23 for chest pain.  Lexiscan Myoview demonstrated no ischemia.  EF was 48%.  He needed to undergo right shoulder arthroplasty with Dr. Rennis Chris 07/03/2022.  This particular appointment is for surgery 07/10/2022 which is ureteroscopy with laser lithotripsy.    He was having a lot of chest symptoms since his surgery in 2020.  Recent Myoview was low risk.  Echocardiogram demonstrated normal LV function.  Referred back to CT surgery to see if there was anything anatomically that could be done to help alleviate his chest pain.  Otherwise consider pain management for pain.  Continue to limit risk factors and continue on medications including aspirin 81 mg daily, Praluent 75 mg every 14 days.  Past Medical History:  Diagnosis Date   BPH (benign prostatic hyperplasia)    Carotid  artery disease (HCC)    [place] 5/22: Bilat 1-39    Complication of anesthesia    Pt verble and "prophecizes" after anesthesia   Coronary Artery Disease cardiologist--- dr cooper   Coronary CTA 09/2018: pLAD 70-90; pD1+pD2 70-90; mRCA mixed plaque - cannot rule out 70-90; FFR suggests hemodynamically significant stenosis // LHC 10/2018: 2v CAD >> s/p  CABG (L-LAD, L radial-RI, S-D2, S-RCA) with Dr. 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   LIMA--LAD;  SVG--D2;  SVG--dRCA;  left radial --- RI   Type 2 diabetes mellitus (HCC)    followed by pcp   (10-01-2020 checks blood sugar 3 times wkly,  fasting sugar-- 72--105)   Wears glasses     Past Surgical History:  Procedure Laterality Date   CORONARY ARTERY BYPASS GRAFT N/A 10/17/2018   Procedure: CORONARY ARTERY BYPASS GRAFTING (CABG), ON PUMP, TIMES FOUR, USING LIMA TO LAD, ENDOSCOPICALLY HARVESTED GREATER SAPHENOUS VEIN TO DIAG 2, SVG TO DISTAL RCA, AND LEFT RADIAL TO RAMUS INTERMEDIUS;  Surgeon: [name], MD;  Location: [facility];  Service: Open Heart Surgery;  Laterality: N/A;  CYSTOSCOPY WITH RETROGRADE PYELOGRAM, URETEROSCOPY AND STENT PLACEMENT Right 12/30/2021   Procedure: CYSTOSCOPY WITH RETROGRADE PYELOGRAM, URETEROSCOPY AND STENT PLACEMENT, BASKET OF STONES;  Surgeon: Alexis Frock, MD;  Location: [facility];  Service: Urology;  Laterality: Right;   ENDOVEIN HARVEST OF GREATER SAPHENOUS VEIN Right 10/17/2018   Procedure: [place] Of Greater Saphenous Vein;  Surgeon: Grace Isaac, MD;  Location: [facility];  Service: Open Heart Surgery;  Laterality: Right;   HAND SURGERY Left 1987   complex laceration   INGUINAL HERNIA REPAIR Bilateral 05/27/2015   Procedure: LAPAROSCOPIC BILATERAL  INGUINAL HERNIA REPAIRS WITH MESH;   Surgeon: Michael Boston, MD;  Location: [facility];  Service: General;  Laterality: Bilateral;   KNEE ARTHROSCOPY Left 1991   LEFT HEART CATH AND CORONARY ANGIOGRAPHY N/A 10/14/2018   Procedure: LEFT HEART CATH AND CORONARY ANGIOGRAPHY;  Surgeon: Belva Crome, MD;  Location: [facility];  Service: Cardiovascular;  Laterality: N/A;   PENILE PROSTHESIS IMPLANT N/A 10/03/2020   Procedure: PLACEMENT OF PENILE PROTHESIS INFLATABLE;  Surgeon: Franchot Gallo, MD;  Location: [facility];  Service: Urology;  Laterality: N/A;  pubis area   RADIAL ARTERY HARVEST Left 10/17/2018   Procedure: RADIAL ARTERY HARVEST;  Surgeon: Grace Isaac, MD;  Location: [facility];  Service: Open Heart Surgery;  Laterality: Left;   REMOVAL OF PENILE PROSTHESIS N/A 07/07/2021   Procedure: EXPLANT  AND REPLACEMENT OF COLOPLAST TITAN PENILE PROSTHESIS;  Surgeon: Franchot Gallo, MD;  Location: [facility];  Service: Urology;  Laterality: N/A;   SCROTAL EXPLORATION N/A 07/07/2021   Procedure: EXPLORATION OF PENILE PROSTHESIS;  Surgeon: Franchot Gallo, MD;  Location: [facility];  Service: Urology;  Laterality: N/A;   SHOULDER CLOSED REDUCTION Left 09/04/2019   Procedure: CLOSED MANIPULATION SHOULDER;  Surgeon: Vickey Huger, MD;  Location: [facility];  Service: Orthopedics;  Laterality: Left;   SHOULDER SURGERY Left 2022   TEE WITHOUT CARDIOVERSION N/A 10/17/2018   Procedure: TRANSESOPHAGEAL ECHOCARDIOGRAM (TEE);  Surgeon: Grace Isaac, MD;  Location: [facility];  Service: Open Heart Surgery;  Laterality: N/A;    Family History  Problem Relation Age of Onset   Hypertension Mother    Cancer Father    Heart failure Father    Sudden death Brother    Heart attack Brother    Hypertension Sister    Heart murmur Sister      Social History   Tobacco Use  Smoking Status Never  Smokeless Tobacco Never    Social History   Substance and Sexual Activity  Alcohol Use No     Allergies  Allergen Reactions    Antihistamines, Diphenhydramine-Type Hives and Other (See Comments)    Seizures   Bactrim [Sulfamethoxazole-Trimethoprim] Itching    Throat itching and suspected pill esophagitis   Demerol Hives   Morphine And Related Hives    Esp with high dose morphine, possibly Dilaudid Tolerates hydrocodone, oxycodone   Compazine [Prochlorperazine] Other (See Comments)    Spastic movement   Metoprolol     Per patient caused "severe headache"   Statins Other (See Comments)    myalgias    Current Outpatient Medications  Medication Sig Dispense Refill   acetaminophen (TYLENOL) 500 MG tablet Take 2 tablets (1,000 mg total) by mouth every 6 (six) hours. 50 tablet 0   Alirocumab (PRALUENT) 75 MG/ML SOAJ INJECT 1 ML INTO THE SKIN EVERY 14 (FOURTEEN) DAYS. 2 mL 11   amLODipine (NORVASC) 10 MG tablet Take 1 tablet (10 mg total) by  mouth daily. 90 tablet 3   aspirin 81 MG tablet Take 1 tablet (81 mg total) by mouth daily.     azithromycin (ZITHROMAX) 250 MG tablet Take 2 tablets on day one, then take 1 tablet daily after that until finished 6 each 0   botulinum toxin Type A (BOTOX) 100 units SOLR injection Inject 100 Units into the muscle every 3 (three) months. To be administered by MD. 1 each 0   ezetimibe (ZETIA) 10 MG tablet Take 1 tablet (10 mg total) by mouth daily. 90 tablet 2   methocarbamol (ROBAXIN) 500 MG tablet Take 500 mg by mouth at bedtime.     predniSONE (DELTASONE) 20 MG tablet Take 20 mg by mouth as needed (pain).     sitaGLIPtin (JANUVIA) 100 MG tablet Take 100 mg by mouth daily.     tamsulosin (FLOMAX) 0.4 MG CAPS capsule Take 0.4 mg by mouth at bedtime.     No current facility-administered medications for this visit.    ROS 14 point ROS reviewed and negative except as per HPI   PHYSICAL EXAMINATION: There were no vitals taken for this visit.  Gen: NAD Neuro: Alert and oriented Resp: Nonlaboured Chest +TTP over left chest 3 cm laterally. No sternal instability. No pain of  sternum itself.  Abd: Soft, ntnd Extr: WWP  Diagnostic Studies & Laboratory data:     Recent Radiology Findings:   CT Chest Wo Contrast  Result Date: 06/02/2022 CLINICAL DATA:  Nonspecific chest wall pain following surgery. EXAM: CT CHEST WITHOUT CONTRAST TECHNIQUE: Multidetector CT imaging of the chest was performed following the standard protocol without IV contrast. RADIATION DOSE REDUCTION: This exam was performed according to the departmental dose-optimization program which includes automated exposure control, adjustment of the mA and/or kV according to patient size and/or use of iterative reconstruction technique. COMPARISON:  Chest CTA 12/30/2021. FINDINGS: Cardiovascular: Atherosclerosis of the aorta, great vessels and coronary arteries status post median sternotomy and CABG. The heart size is normal. There is no pericardial effusion. Mediastinum/Nodes: There are no enlarged mediastinal, hilar or axillary lymph nodes.Hilar assessment is limited by the lack of intravenous contrast, although the hilar contours appear unchanged. The thyroid gland, trachea and esophagus demonstrate no significant findings. Lungs/Pleura: No pleural effusion or pneumothorax. Interval improved aeration of the lungs which are essentially clear. No suspicious pulmonary nodule. Upper abdomen: Small nonobstructing calculi within the upper pole of the left kidney. The visualized upper abdomen is otherwise unremarkable. Musculoskeletal/Chest wall: There is no chest wall mass or suspicious osseous finding. The median sternotomy appears healed. The sternotomy wires are intact. No surrounding inflammatory changes. IMPRESSION: 1. No acute chest findings or explanation for the patient's symptoms. 2. The median sternotomy appears healed. No surrounding inflammatory changes. 3. Nonobstructing left renal calculi. 4.  Aortic Atherosclerosis (ICD10-I70.0). Electronically Signed   By: Richardean Sale M.D.   On: 06/02/2022 13:10   MR  BRAIN WO CONTRAST  Result Date: 05/23/2022  [facility] [address] [place], [place] [address] [id number] NEUROIMAGING REPORT STUDY DATE: 05/23/2022 PATIENT NAME: Danny Brewer DOB: Nov 02, 1970 MRN: [id number] EXAM: MRI Brain without contrast ORDERING CLINICIAN: Richard A. Sater, MD. PhD CLINICAL HISTORY: 52 year old man with headaches and left hemifacial spasms COMPARISON FILMS: 11/09/2021 TECHNIQUE: MRI of the brain without contrast was obtained utilizing 5 mm axial slices with T1, T2, T2 flair, SWI and diffusion weighted views.  T1 sagittal and T2 coronal views were obtained. CONTRAST: none IMAGING SITE: [facility], [address] [place], [place]  FINDINGS: On sagittal images, the spinal cord is imaged caudally to C3 and is normal in caliber.   The contents of the posterior fossa are of normal size and position.   The pituitary gland and optic chiasm appear normal.    Brain volume appears normal.   There is a cavum of septum pellucidum at vergae.  There are no abnormal extra-axial collections of fluid.  There is a T2/flair hyperintense focus in the posterior right pons, unchanged compared to the previous MRI.  The cerebellum appears normal.   The deep gray matter appears normal.  The cerebral hemispheres appear normal.  Diffusion weighted images are normal.  Susceptibility weighted images show a chronic microhemorrhage in the left temporal lobe, also seen on the previous MRI As seen on previous MRI, the right vertebral artery and basilar artery are tortuous and distorted the dorsal root entry zone and transitional zone of the left trigeminal nerve and distorts the left facial nerve..  The orbits appear normal.   The VIIth/VIIIth nerve complex appears normal.  The mastoid air cells appear normal.  Mucoperiosteal thickening of the maxillary sinuses, some of the ethmoid air cells and the left hemi sphenoid is noted..    This MRI of the brain without contrast shows the following:  The right vertebral artery and the basilar artery are tortuous and distort the left trigeminal and facial nerves.  This is unchanged compared to the MRI from 11/09/2021 T2/FLAIR hyperintense focus in the dorsal right pons likely represents a small focus of chronic microvascular ischemic change, unchanged compared to the previous MRI. Single chronic microhemorrhage in the left temporal lobe.  A single focus is unlikely to be clinically significant. Chronic maxillary, ethmoid and sphenoid sinusitis. No acute findings. INTERPRETING PHYSICIAN: Richard A. Felecia Shelling, MD, PhD, FAAN Certified in  Neuroimaging by [employer]       I have independently reviewed the above radiology studies  and reviewed the findings with the patient.   Recent Lab Findings: Lab Results  Component Value Date   WBC 6.0 02/27/2022   HGB 13.1 02/27/2022   HCT 41.1 02/27/2022   PLT 282 02/27/2022   GLUCOSE 94 02/27/2022   CHOL 136 07/23/2021   TRIG 50 07/23/2021   HDL 56 07/23/2021   LDLCALC 69 07/23/2021   ALT 12 12/30/2021   AST 16 12/30/2021   NA 140 02/27/2022   K 3.6 02/27/2022   CL 108 02/27/2022   CREATININE 1.12 02/27/2022   BUN 10 02/27/2022   CO2 26 02/27/2022   TSH 3.900 09/18/2018   INR 1.0 04/22/2020   HGBA1C 6.3 (H) 06/16/2021     Assessment / Plan:   Danny Brewer 52 y.o. male here for evaluation of chest pain s/p CABG in 2020 (by Dr. Servando Snare)    He has chest pain 2-3 left lateral to sternum. Has had since surgery. Feels deeper, not at skin.   Typically answer for this would be pain meds PRN as there is no significant defect on Ct.   I've seen a number of patients from areas of [place] and [place] after CAB with both sternal and chest wall issues. 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   I've treated lateralizaing chest wall pain with intercostal ablation successfully a number of times. I went over with the patient this is  off label.  It may provide no benefit. It has a risk to life and of morbidity (bleeding, infection, damage surrounding  structures). Specifically I told him even could worsen nerve pain. I would do this from outside the chest as he may have internal adhesions from mammary takedown.   We agree to meet again in clinic after a period of time to rediscuss.   I  spent 60 minutes with  the patient face to face in counseling and coordination of care.    Danny Brewer 06/21/2022 4:33 PM

## 2022-06-22 ENCOUNTER — Ambulatory Visit: Payer: BC Managed Care – PPO | Admitting: Physician Assistant

## 2022-06-22 DIAGNOSIS — E785 Hyperlipidemia, unspecified: Secondary | ICD-10-CM

## 2022-06-22 DIAGNOSIS — Z01810 Encounter for preprocedural cardiovascular examination: Secondary | ICD-10-CM

## 2022-06-22 DIAGNOSIS — I2510 Atherosclerotic heart disease of native coronary artery without angina pectoris: Secondary | ICD-10-CM

## 2022-06-22 DIAGNOSIS — I6523 Occlusion and stenosis of bilateral carotid arteries: Secondary | ICD-10-CM

## 2022-06-22 DIAGNOSIS — Z951 Presence of aortocoronary bypass graft: Secondary | ICD-10-CM

## 2022-06-22 DIAGNOSIS — I10 Essential (primary) hypertension: Secondary | ICD-10-CM

## 2022-06-22 NOTE — Progress Notes (Unsigned)
Office Visit    Patient Name: Danny Brewer Date of Encounter: 06/22/2022  PCP:  Jackelyn Poling, DO   [facility]  Cardiologist:  Tonny Bollman, MD  Advanced Practice Provider:  Beatrice Lecher, PA-C Electrophysiologist:  None   HPI    Price Lachapelle is a 52 y.o. male with past medical history significant for coronary artery disease status post CABG 10/2018, reduced EF on Myoview 03/20/2022 but preserved EF on echocardiogram 03/25/2022 (EF 60 to 65%, no RWMA, normal RV SF, trivial MR, diabetes mellitus type 2, hypertension, hyperlipidemia (intolerant to statins and on Praluent), carotid stenosis (ultrasound 10/21/2020 with bilateral ICA 1 to 39% and updated ultrasound 03/21/2022 with no ICA stenosis bilaterally), ED, nephrolithiasis presents today for preop evaluation.  He was last seen 05/15/2022 and at that time he returned for a follow-up appointment after having a recent emergency room visit 9/23 for chest pain.  Lexiscan Myoview demonstrated no ischemia.  EF was 48%.  He needed to undergo right shoulder arthroplasty with Dr. Rennis Chris 07/03/2022.  This particular appointment is for surgery 07/10/2022 which is ureteroscopy with laser lithotripsy.   He was having a lot of chest symptoms since his surgery in 2020.  Recent Myoview was low risk.  Echocardiogram demonstrated normal LV function.  Referred back to CT surgery to see if there was anything anatomically that could be done to help alleviate his chest pain.  Otherwise consider pain management for pain.  Continue to limit risk factors and continue on medications including aspirin 81 mg daily, Praluent 75 mg every 14 days.  Today, he *** Past Medical History    Past Medical History:  Diagnosis Date   BPH (benign prostatic hyperplasia)    Carotid artery disease (HCC)    [place] 5/22: Bilat 1-39    Complication of anesthesia    Pt verble and "prophecizes" after anesthesia   Coronary Artery Disease cardiologist--- dr  cooper   Coronary CTA 09/2018: pLAD 70-90; pD1+pD2 70-90; mRCA mixed plaque - cannot rule out 70-90; FFR suggests hemodynamically significant stenosis // LHC 10/2018: 2v CAD >> s/p  CABG (L-LAD, L radial-RI, S-D2, S-RCA) with Dr. 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   LIMA--LAD;  SVG--D2;  SVG--dRCA;  left radial --- RI   Type 2 diabetes mellitus (HCC)    followed by pcp   (10-01-2020 checks blood sugar 3 times wkly,  fasting sugar-- 72--105)   Wears glasses    Past Surgical History:  Procedure Laterality Date   CORONARY ARTERY BYPASS GRAFT N/A 10/17/2018   Procedure: CORONARY ARTERY BYPASS GRAFTING (CABG), ON PUMP, TIMES FOUR, USING LIMA TO LAD, ENDOSCOPICALLY HARVESTED GREATER SAPHENOUS VEIN TO DIAG 2, SVG TO DISTAL RCA, AND LEFT RADIAL TO RAMUS INTERMEDIUS;  Surgeon: [name], MD;  Location: [facility];  Service: Open Heart Surgery;  Laterality: N/A;   CYSTOSCOPY WITH RETROGRADE PYELOGRAM, URETEROSCOPY AND STENT PLACEMENT Right 12/30/2021   Procedure: CYSTOSCOPY WITH RETROGRADE PYELOGRAM, URETEROSCOPY AND STENT PLACEMENT, BASKET OF STONES;  Surgeon: Sebastian Ache, MD;  Location: [facility];  Service: Urology;  Laterality: Right;   ENDOVEIN HARVEST OF GREATER SAPHENOUS VEIN Right 10/17/2018   Procedure: Mack Guise Of Greater Saphenous  Vein;  Surgeon: Grace Isaac, MD;  Location: [place];  Service: Open Heart Surgery;  Laterality: Right;   HAND SURGERY Left 1987   complex laceration   INGUINAL HERNIA REPAIR Bilateral 05/27/2015   Procedure: LAPAROSCOPIC BILATERAL  INGUINAL HERNIA REPAIRS WITH MESH;  Surgeon: Michael Boston, MD;  Location: [facility];  Service: General;  Laterality: Bilateral;   KNEE ARTHROSCOPY Left 1991   LEFT HEART CATH AND CORONARY ANGIOGRAPHY N/A  10/14/2018   Procedure: LEFT HEART CATH AND CORONARY ANGIOGRAPHY;  Surgeon: Belva Crome, MD;  Location: [facility];  Service: Cardiovascular;  Laterality: N/A;   PENILE PROSTHESIS IMPLANT N/A 10/03/2020   Procedure: PLACEMENT OF PENILE PROTHESIS INFLATABLE;  Surgeon: Franchot Gallo, MD;  Location: [facility];  Service: Urology;  Laterality: N/A;  pubis area   RADIAL ARTERY HARVEST Left 10/17/2018   Procedure: RADIAL ARTERY HARVEST;  Surgeon: Grace Isaac, MD;  Location: [place];  Service: Open Heart Surgery;  Laterality: Left;   REMOVAL OF PENILE PROSTHESIS N/A 07/07/2021   Procedure: EXPLANT  AND REPLACEMENT OF COLOPLAST TITAN PENILE PROSTHESIS;  Surgeon: Franchot Gallo, MD;  Location: [facility];  Service: Urology;  Laterality: N/A;   SCROTAL EXPLORATION N/A 07/07/2021   Procedure: EXPLORATION OF PENILE PROSTHESIS;  Surgeon: Franchot Gallo, MD;  Location[facility];  Service: Urology;  Laterality: N/A;   SHOULDER CLOSED REDUCTION Left 09/04/2019   Procedure: CLOSED MANIPULATION SHOULDER;  Surgeon: Vickey Huger, MD;  Location: [facility];  Service: Orthopedics;  Laterality: Left;   SHOULDER SURGERY Left 2022   TEE WITHOUT CARDIOVERSION N/A 10/17/2018   Procedure: TRANSESOPHAGEAL ECHOCARDIOGRAM (TEE);  Surgeon: Grace Isaac, MD;  Location: [place];  Service: Open Heart Surgery;  Laterality: N/A;    Allergies  Allergies  Allergen Reactions   Antihistamines, Diphenhydramine-Type Hives and Other (See Comments)    Seizures   Bactrim [Sulfamethoxazole-Trimethoprim] Itching    Throat itching and suspected pill esophagitis   Demerol Hives   Morphine And Related Hives    Esp with high dose morphine, possibly Dilaudid Tolerates hydrocodone, oxycodone   Compazine [Prochlorperazine] Other (See Comments)    Spastic movement   Metoprolol     Per patient caused "severe headache"   Statins Other (See Comments)    myalgias    EKGs/Labs/Other Studies Reviewed:    The following studies were reviewed today:  Lexiscan Myoview 03/20/2022    The study is normal. The study is low risk.   No ST deviation was noted.   LV perfusion is normal. There is no evidence of ischemia. There is no evidence of infarction.   Left ventricular function is abnormal. Global function is mildly reduced. Nuclear stress EF: 48 %. The left ventricular ejection fraction is mildly decreased (45-54%). End diastolic cavity size is normal. End systolic cavity size is normal.   Prior study not available for comparison.   Normal perfusion Mild systolic dysfunction (EF [id number]) with septal hypokinesis.  Consider echocardiogram for further evaluation Low risk study  Echo 03/25/22  IMPRESSIONS     1. Left ventricular ejection fraction, by estimation, is 60 to 65%. The  left ventricle has normal function. The left ventricle has no regional  wall motion abnormalities. Left ventricular diastolic parameters were  normal.   2. Right ventricular systolic function is normal. The right ventricular  size is normal.   3. The mitral valve is normal in structure. Trivial mitral valve  regurgitation.   4. The aortic valve is tricuspid. Aortic  valve regurgitation is not  visualized.   FINDINGS   Left Ventricle: Left ventricular ejection fraction, by estimation, is 60  to 65%. The left ventricle has normal function. The left ventricle has no  regional wall motion abnormalities. The left ventricular internal cavity  size was normal in size. There is   no left ventricular hypertrophy. Left ventricular diastolic parameters  were normal.   Right Ventricle: The right ventricular size is normal. Right vetricular  wall thickness was not assessed. Right ventricular systolic function is  normal.   Left Atrium: Left atrial size was normal in size.   Right Atrium: Right atrial size was normal in size.   Pericardium: There is no evidence of pericardial effusion.   Mitral Valve: The mitral  valve is normal in structure. Trivial mitral  valve regurgitation.   Tricuspid Valve: The tricuspid valve is normal in structure. Tricuspid  valve regurgitation is trivial.   Aortic Valve: The aortic valve is tricuspid. Aortic valve regurgitation is  not visualized.   Pulmonic Valve: The pulmonic valve was normal in structure. Pulmonic valve  regurgitation is mild.   Aorta: The aortic root and ascending aorta are structurally normal, with  no evidence of dilitation.   IAS/Shunts: No atrial level shunt detected by color flow Doppler.   EKG:  EKG is *** ordered today.  The ekg ordered today demonstrates ***  Recent Labs: 12/30/2021: ALT 12 02/27/2022: BUN 10; Creatinine, Ser 1.12; Hemoglobin 13.1; Platelets 282; Potassium 3.6; Sodium 140  Recent Lipid Panel    Component Value Date/Time   CHOL 136 07/23/2021 0920   TRIG 50 07/23/2021 0920   HDL 56 07/23/2021 0920   CHOLHDL 2.4 07/23/2021 0920   CHOLHDL 4.3 09/19/2018 0243   VLDL 13 09/19/2018 0243   LDLCALC 69 07/23/2021 0920    Risk Assessment/Calculations:  {Does this patient have ATRIAL FIBRILLATION?:[phone]}  Home Medications   No outpatient medications have been marked as taking for the 06/23/22 encounter (Appointment) with Elgie Collard, PA-C.     Review of Systems   ***   All other systems reviewed and are otherwise negative except as noted above.  Physical Exam    VS:  There were no vitals taken for this visit. , BMI There is no height or weight on file to calculate BMI.  Wt Readings from Last 3 Encounters:  05/15/22 194 lb 9.6 oz (88.3 kg)  03/20/22 194 lb (88 kg)  03/09/22 194 lb (88 kg)     GEN: Well nourished, well developed, in no acute distress. HEENT: normal. Neck: Supple, no JVD, carotid bruits, or masses. Cardiac: ***RRR, no murmurs, rubs, or gallops. No clubbing, cyanosis, edema.  ***Radials/PT 2+ and equal bilaterally.  Respiratory:  ***Respirations regular and unlabored, clear to  auscultation bilaterally. GI: Soft, nontender, nondistended. MS: No deformity or atrophy. Skin: Warm and dry, no rash. Neuro:  Strength and sensation are intact. Psych: Normal affect.  Assessment & Plan    Preop Eval {Click Here to Calculate RCRI      :[id number]}  { Click Here to Calculate DASI      :409811914} {Select to add RCRI Risk (<1%=LOW; >/=1%=HIGH) (Optional):21036017}  {Select if HIGH (RCRI >/=1%) Risk (Optional):21036030} Recommendations: {2014 ACC/AHA Perioperative Guidelines  :21036001} Antiplatelet and/or Anticoagulation Recommendations: {Antiplatelet Recommendations                  :21036016} {Anticoagulation Recommendations           :78295621}  CAD s/p CABG 2020 Essential HTN  HLD BPPV  No BP recorded.  {Refresh Note OR Click here to enter BP  :1}***      Disposition: Follow up {follow up:15908} with Sherren Mocha, MD or APP.  Signed, Elgie Collard, PA-C 06/22/2022, 9:25 PM [facility]

## 2022-06-23 ENCOUNTER — Ambulatory Visit: Payer: BC Managed Care – PPO | Attending: Physician Assistant | Admitting: Physician Assistant

## 2022-06-23 DIAGNOSIS — Z01810 Encounter for preprocedural cardiovascular examination: Secondary | ICD-10-CM

## 2022-06-23 DIAGNOSIS — I10 Essential (primary) hypertension: Secondary | ICD-10-CM

## 2022-06-23 DIAGNOSIS — I2510 Atherosclerotic heart disease of native coronary artery without angina pectoris: Secondary | ICD-10-CM

## 2022-06-23 DIAGNOSIS — H8110 Benign paroxysmal vertigo, unspecified ear: Secondary | ICD-10-CM

## 2022-06-23 DIAGNOSIS — Z951 Presence of aortocoronary bypass graft: Secondary | ICD-10-CM

## 2022-06-23 DIAGNOSIS — E785 Hyperlipidemia, unspecified: Secondary | ICD-10-CM

## 2022-06-24 NOTE — Progress Notes (Addendum)
Mental status:  A&O x4  COVID Vaccine Completed: yes  Date of COVID positive in last 90 days: no  PCP - Lurline Del, DO Cardiologist - Sherren Mocha, MD  Cardiac clearance- Pt waiting for appointment to get clearance  CT- 06/02/22 Epic Chest x-ray - n/a EKG - 03/02/22 Epic Stress Test - 03/20/22 Epic ECHO - 03/25/22 Epic Cardiac Cath - 10/14/18 Epic Pacemaker/ICD device last checked: n/a Spinal Cord Stimulator: n/a  Bowel Prep - no  Sleep Study - yes CPAP - no  Fasting Blood Sugar - 70 Checks Blood Sugar  not currently checking  Last dose of GLP1 agonist-  N/A GLP1 instructions:  N/A   Last dose of SGLT-2 inhibitors-  N/A SGLT-2 instructions: N/A   Blood Thinner Instructions: Aspirin Instructions: ASA 81, hold 5 days pt reports last dose 06/22/22 Last Dose:  Activity level: Can go up a flight of stairs and perform activities of daily living without stopping and without symptoms of chest pain or shortness of breath.    Anesthesia review: CAD, HTN, OSA, DM2, CABG x4, HF, BPPV, atherosclerosis of aorta  Patient denies shortness of breath, fever, cough and chest pain at PAT appointment  Patient verbalized understanding of instructions that were given to them at the PAT appointment. Patient was also instructed that they will need to review over the PAT instructions again at home before surgery.

## 2022-06-24 NOTE — Patient Instructions (Addendum)
SURGICAL WAITING ROOM VISITATION  Patients having surgery or a procedure may have no more than 2 support people in the waiting area - these visitors may rotate.    Children under the age of 53 must have an adult with them who is not the patient.  Due to an increase in RSV and influenza rates and associated hospitalizations, children ages 79 and under may not visit patients in [facility] hospitals.  If the patient needs to stay at the hospital during part of their recovery, the visitor guidelines for inpatient rooms apply. Pre-op nurse will coordinate an appropriate time for 1 support person to accompany patient in pre-op.  This support person may not rotate.    Please refer to the [facility] website for the visitor guidelines for Inpatients (after your surgery is over and you are in a regular room).     Your procedure is scheduled on: 07/10/22   Report to [facility] Main Entrance    Report to admitting at AM   Call this number if you have problems the morning of surgery [phone]   Do not eat food or drink liquids :After Midnight.          If you have questions, please contact your surgeon's office.   FOLLOW BOWEL PREP AND ANY ADDITIONAL PRE OP INSTRUCTIONS YOU RECEIVED FROM YOUR SURGEON'S OFFICE!!!     Oral Hygiene is also important to reduce your risk of infection.                                    Remember - BRUSH YOUR TEETH THE MORNING OF SURGERY WITH YOUR REGULAR TOOTHPASTE  DENTURES WILL BE REMOVED PRIOR TO SURGERY PLEASE DO NOT APPLY "Poly grip" OR ADHESIVES!!!  Do NOT smoke after Midnight   Take these medicines the morning of surgery with A SIP OF WATER: Tylenol, Amlodipine, Ezetimibe   DO NOT TAKE ANY ORAL DIABETIC MEDICATIONS DAY OF YOUR SURGERY  How to Manage Your Diabetes Before and After Surgery  Why is it important to control my blood sugar before and after surgery? Improving blood sugar levels before and after surgery helps healing and can limit  problems. A way of improving blood sugar control is eating a healthy diet by:  Eating less sugar and carbohydrates  Increasing activity/exercise  Talking with your doctor about reaching your blood sugar goals High blood sugars (greater than 180 mg/dL) can raise your risk of infections and slow your recovery, so you will need to focus on controlling your diabetes during the weeks before surgery. Make sure that the doctor who takes care of your diabetes knows about your planned surgery including the date and location.  How do I manage my blood sugar before surgery? Check your blood sugar at least 4 times a day, starting 2 days before surgery, to make sure that the level is not too high or low. Check your blood sugar the morning of your surgery when you wake up and every 2 hours until you get to the Short Stay unit. If your blood sugar is less than 70 mg/dL, you will need to treat for low blood sugar: Do not take insulin. Treat a low blood sugar (less than 70 mg/dL) with  cup of clear juice (cranberry or apple), 4 glucose tablets, OR glucose gel. Recheck blood sugar in 15 minutes after treatment (to make sure it is greater than 70 mg/dL). If your  blood sugar is not greater than 70 mg/dL on recheck, call [phone] for further instructions. Report your blood sugar to the short stay nurse when you get to Short Stay.  If you are admitted to the hospital after surgery: Your blood sugar will be checked by the staff and you will probably be given insulin after surgery (instead of oral diabetes medicines) to make sure you have good blood sugar levels. The goal for blood sugar control after surgery is 80-180 mg/dL.   WHAT DO I DO ABOUT MY DIABETES MEDICATION?  Do not take oral diabetes medicines (pills) the morning of surgery.  THE DAY BEFORE SURGERY, take Januvia as prescribed.      THE MORNING OF SURGERY, do not take Januvia.  Reviewed and Endorsed by [facility] Patient Education Committee,  August 2015  Bring CPAP mask and tubing day of surgery.                              You may not have any metal on your body including jewelry, and body piercing             Do not wear lotions, powders, cologne, or deodorant              Men may shave face and neck.   Do not bring valuables to the hospital. [place].   Contacts, glasses, dentures or bridgework may not be worn into surgery.  DO NOT [place]. PHARMACY WILL DISPENSE MEDICATIONS LISTED ON YOUR MEDICATION LIST TO YOU DURING YOUR ADMISSION [place]!    Patients discharged on the day of surgery will not be allowed to drive home.  Someone NEEDS to stay with you for the first 24 hours after anesthesia.   Special Instructions: Bring a copy of your healthcare power of attorney and living will documents the day of surgery if you haven't scanned them before.              Please read over the following fact sheets you were given: IF [place] [phone] [id number] Schneiders    If you received a COVID test during your pre-op visit  it is requested that you wear a mask when out in public, stay away from anyone that may not be feeling well and notify your surgeon if you develop symptoms. If you test positive for Covid or have been in contact with anyone that has tested positive in the last 10 days please notify you surgeon.    [facility] - Preparing for Surgery Before surgery, you can play an important role.  Because skin is not sterile, your skin needs to be as free of germs as possible.  You can reduce the number of germs on your skin by washing with CHG (chlorahexidine gluconate) soap before surgery.  CHG is an antiseptic cleaner which kills germs and bonds with the skin to continue killing germs even after washing. Please DO NOT use if you have an allergy to CHG or antibacterial soaps.  If your skin  becomes reddened/irritated stop using the CHG and inform your nurse when you arrive at Short Stay. Do not shave (including legs and underarms) for at least 48 hours prior to the first CHG shower.  You may shave your face/neck.  Please  follow these instructions carefully:  1.  Shower with CHG Soap the night before surgery and the  morning of surgery.  2.  If you choose to wash your hair, wash your hair first as usual with your normal  shampoo.  3.  After you shampoo, rinse your hair and body thoroughly to remove the shampoo.                             4.  Use CHG as you would any other liquid soap.  You can apply chg directly to the skin and wash.  Gently with a scrungie or clean washcloth.  5.  Apply the CHG Soap to your body ONLY FROM THE NECK DOWN.   Do   not use on face/ open                           Wound or open sores. Avoid contact with eyes, ears mouth and   genitals (private parts).                       Wash face,  Genitals (private parts) with your normal soap.             6.  Wash thoroughly, paying special attention to the area where your    surgery  will be performed.  7.  Thoroughly rinse your body with warm water from the neck down.  8.  DO NOT shower/wash with your normal soap after using and rinsing off the CHG Soap.                9.  Pat yourself dry with a clean towel.            10.  Wear clean pajamas.            11.  Place clean sheets on your bed the night of your first shower and do not  sleep with pets. Day of Surgery : Do not apply any lotions/deodorants the morning of surgery.  Please wear clean clothes to the hospital/surgery center.  FAILURE TO FOLLOW THESE INSTRUCTIONS MAY RESULT IN THE CANCELLATION OF YOUR SURGERY  PATIENT SIGNATURE_________________________________  NURSE SIGNATURE__________________________________  ________________________________________________________________________

## 2022-06-25 ENCOUNTER — Encounter (HOSPITAL_COMMUNITY): Payer: Self-pay

## 2022-06-25 ENCOUNTER — Encounter (HOSPITAL_COMMUNITY)
Admission: RE | Admit: 2022-06-25 | Discharge: 2022-06-25 | Disposition: A | Discharge status: Home / Self Care | Payer: BC Managed Care – PPO | Source: Ambulatory Visit | Attending: Urology | Admitting: Urology

## 2022-06-25 VITALS — BP 138/97 | HR 89 | Temp 97.8°F | Ht 69.0 in | Wt 193.0 lb

## 2022-06-25 DIAGNOSIS — Z01812 Encounter for preprocedural laboratory examination: Secondary | ICD-10-CM | POA: Diagnosis not present

## 2022-06-25 DIAGNOSIS — I10 Essential (primary) hypertension: Secondary | ICD-10-CM | POA: Insufficient documentation

## 2022-06-25 DIAGNOSIS — E119 Type 2 diabetes mellitus without complications: Secondary | ICD-10-CM | POA: Diagnosis not present

## 2022-06-25 LAB — CBC
HCT: 46.1 % (ref 39.0–52.0)
Hemoglobin: 14.3 g/dL (ref 13.0–17.0)
MCH: 27.5 pg (ref 26.0–34.0)
MCHC: 31 g/dL (ref 30.0–36.0)
MCV: 88.7 fL (ref 80.0–100.0)
Platelets: 249 10*3/uL (ref 150–400)
RBC: 5.2 MIL/uL (ref 4.22–5.81)
RDW: 13.1 % (ref 11.5–15.5)
WBC: 7 10*3/uL (ref 4.0–10.5)
nRBC: 0 % (ref 0.0–0.2)

## 2022-06-25 LAB — HEMOGLOBIN A1C
Hgb A1c MFr Bld: 6.2 % — ABNORMAL HIGH (ref 4.8–5.6)
Mean Plasma Glucose: 131.24 mg/dL

## 2022-06-25 LAB — BASIC METABOLIC PANEL
Anion gap: 6 (ref 5–15)
BUN: 19 mg/dL (ref 6–20)
CO2: 25 mmol/L (ref 22–32)
Calcium: 8.8 mg/dL — ABNORMAL LOW (ref 8.9–10.3)
Chloride: 106 mmol/L (ref 98–111)
Creatinine, Ser: 1.44 mg/dL — ABNORMAL HIGH (ref 0.61–1.24)
GFR, Estimated: 58 mL/min — ABNORMAL LOW (ref >60)
Glucose, Bld: 97 mg/dL (ref 70–99)
Potassium: 4.4 mmol/L (ref 3.5–5.1)
Sodium: 137 mmol/L (ref 135–145)

## 2022-06-25 LAB — GLUCOSE, CAPILLARY: Glucose-Capillary: 90 mg/dL (ref 70–99)

## 2022-06-26 NOTE — Telephone Encounter (Signed)
Called patient to come pick up records and disability form.

## 2022-07-02 ENCOUNTER — Institutional Professional Consult (permissible substitution) (INDEPENDENT_AMBULATORY_CARE_PROVIDER_SITE_OTHER): Payer: BC Managed Care – PPO | Admitting: Cardiothoracic Surgery

## 2022-07-02 VITALS — BP 132/80 | HR 96 | Resp 20 | Ht 69.0 in | Wt 193.0 lb

## 2022-07-02 DIAGNOSIS — Z951 Presence of aortocoronary bypass graft: Secondary | ICD-10-CM | POA: Diagnosis not present

## 2022-07-03 NOTE — Progress Notes (Signed)
Anesthesia Chart Review   Case: [id number] Date/Time: 07/10/22 1100   Procedures:      CYSTOSCOPY WITH RETROGRADE PYELOGRAM, URETEROSCOPY AND STENT PLACEMENT (Left) - 1 HR     HOLMIUM LASER APPLICATION (Left)   Anesthesia type: General   Pre-op diagnosis: LEFT RENAL CALCULUS   Location: [place] [facility]   Surgeons: Alexis Frock, MD       DISCUSSION:52 y.o. never smoker with h/o HTN, DM II, CAD (CABG 2020), left renal calculus scheduled for above procedure 07/10/22 with Dr. Alexis Frock.   VS: There were no vitals taken for this visit.  PROVIDERS: Lurline Del, DO is PCP   Primary Cardiologist: Sherren Mocha, MD  LABS: {[facility] AN LABS REVIEWED:[id number]::"Labs reviewed: Acceptable for surgery."} (all labs ordered are listed, but only abnormal results are displayed)  Labs Reviewed - No data to display   IMAGES:   EKG:   CV: Echo 03/25/2022 1. Left ventricular ejection fraction, by estimation, is 60 to 65%. The  left ventricle has normal function. The left ventricle has no regional  wall motion abnormalities. Left ventricular diastolic parameters were  normal.   2. Right ventricular systolic function is normal. The right ventricular  size is normal.   3. The mitral valve is normal in structure. Trivial mitral valve  regurgitation.   4. The aortic valve is tricuspid. Aortic valve regurgitation is not  visualized.   Myocardial Perfusion 03/20/2022   The study is normal. The study is low risk.   No ST deviation was noted.   LV perfusion is normal. There is no evidence of ischemia. There is no evidence of infarction.   Left ventricular function is abnormal. Global function is mildly reduced. Nuclear stress EF: 48 %. The left ventricular ejection fraction is mildly decreased (45-54%). End diastolic cavity size is normal. End systolic cavity size is normal.   Prior study not available for comparison.   Normal perfusion Mild systolic dysfunction (EF 78%) with  septal hypokinesis.  Consider echocardiogram for further evaluation Low risk study Past Medical History:  Diagnosis Date   BPH (benign prostatic hyperplasia)    Carotid artery disease ([place])    [place] 5/22: Bilat 9-38    Complication of anesthesia    Pt verble and "prophecizes" after anesthesia   Coronary Artery Disease cardiologist--- dr cooper   Coronary CTA 09/2018: pLAD 70-90; pD1+pD2 70-90; mRCA mixed plaque - cannot rule out 70-90; FFR suggests hemodynamically significant stenosis // LHC 10/2018: 2v CAD >> s/p  CABG (L-LAD, L radial-RI, S-D2, S-RCA) with Dr. 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  OR;  Service: Open Heart Surgery;  Laterality: N/A;   CYSTOSCOPY WITH RETROGRADE PYELOGRAM, URETEROSCOPY AND STENT PLACEMENT Right 12/30/2021   Procedure: CYSTOSCOPY WITH RETROGRADE PYELOGRAM, URETEROSCOPY AND STENT PLACEMENT, BASKET OF STONES;  Surgeon: Alexis Frock, MD;  Location[facility];  Service: Urology;  Laterality: Right;   ENDOVEIN HARVEST OF GREATER SAPHENOUS VEIN Right 10/17/2018   Procedure: [place] Of Greater Saphenous Vein;  Surgeon: Grace Isaac, MD;  Location: [place];  Service: Open Heart Surgery;  Laterality:  Right;   HAND SURGERY Left 1987   complex laceration   INGUINAL HERNIA REPAIR Bilateral 05/27/2015   Procedure: LAPAROSCOPIC BILATERAL  INGUINAL HERNIA REPAIRS WITH MESH;  Surgeon: Michael Boston, MD;  [facility];  Service: General;  Laterality: Bilateral;   KNEE ARTHROSCOPY Left 1991   LEFT HEART CATH AND CORONARY ANGIOGRAPHY N/A 10/14/2018   Procedure: LEFT HEART CATH AND CORONARY ANGIOGRAPHY;  Surgeon: Belva Crome, MD;  Location: [place] [facility];  Service: Cardiovascular;  Laterality: N/A;   PENILE PROSTHESIS IMPLANT N/A 10/03/2020   Procedure: PLACEMENT OF PENILE PROTHESIS INFLATABLE;  Surgeon: Franchot Gallo, MD;  Location: [facility];  Service: Urology;  Laterality: N/A;  pubis area   RADIAL ARTERY HARVEST Left 10/17/2018   Procedure: RADIAL ARTERY HARVEST;  Surgeon: Grace Isaac, MD;  Location: [place];  Service: Open Heart Surgery;  Laterality: Left;   REMOVAL OF PENILE PROSTHESIS N/A 07/07/2021   Procedure: EXPLANT  AND REPLACEMENT OF COLOPLAST TITAN PENILE PROSTHESIS;  Surgeon: Franchot Gallo, MD;  [facility];  Service: Urology;  Laterality: N/A;   SCROTAL EXPLORATION N/A 07/07/2021   Procedure: EXPLORATION OF PENILE PROSTHESIS;  Surgeon: Franchot Gallo, MD;  [facility];  Service: Urology;  Laterality: N/A;   SHOULDER CLOSED REDUCTION Left 09/04/2019   Procedure: CLOSED MANIPULATION SHOULDER;  Surgeon: Vickey Huger, MD;  Location[facility];  Service: Orthopedics;  Laterality: Left;   SHOULDER SURGERY Left 2022   TEE WITHOUT CARDIOVERSION N/A 10/17/2018   Procedure: TRANSESOPHAGEAL ECHOCARDIOGRAM (TEE);  Surgeon: Grace Isaac, MD;  Location: [place];  Service: Open Heart Surgery;  Laterality: N/A;    MEDICATIONS: No current facility-administered medications for this encounter.    acetaminophen (TYLENOL) 500 MG tablet   amLODipine (NORVASC) 10 MG tablet   aspirin 81 MG tablet   botulinum toxin Type A (BOTOX) 100 units SOLR  injection   ezetimibe (ZETIA) 10 MG tablet   methocarbamol (ROBAXIN) 500 MG tablet   tamsulosin (FLOMAX) 0.4 MG CAPS capsule   Alirocumab (PRALUENT) 75 MG/ML SOAJ   azithromycin (ZITHROMAX) 250 MG tablet   sitaGLIPtin (JANUVIA) 100 MG tablet

## 2022-07-08 HISTORY — PX: SHOULDER ARTHROSCOPY: SHX128

## 2022-07-14 ENCOUNTER — Ambulatory Visit: Payer: BC Managed Care – PPO | Admitting: Physician Assistant

## 2022-07-20 ENCOUNTER — Telehealth: Payer: Self-pay | Admitting: Neurology

## 2022-07-20 NOTE — Telephone Encounter (Signed)
Pt will have [employer] for 2024 and will need a new auth for botox before injection scheduled for 09/07/22

## 2022-07-21 ENCOUNTER — Telehealth: Payer: Self-pay | Admitting: Cardiovascular Disease

## 2022-07-21 NOTE — Telephone Encounter (Signed)
Patients wife wanted the MD to fill out wellness claim form, however, she changed her mind when I explained the process. Advised her to call the clinic if she changes her mind. Patients wife voiced understanding.

## 2022-07-21 NOTE — Telephone Encounter (Signed)
Wife calling in to get info to be able to fill out paperwork on the patient. Please advise

## 2022-08-03 ENCOUNTER — Telehealth: Payer: Self-pay | Admitting: Cardiovascular Disease

## 2022-08-03 NOTE — Telephone Encounter (Signed)
I left message for surgery scheduler for Dr. Tresa Moore. Pt has cancelled and no showed for 3 appts for pre op clearance. Pt is going to need an in office appt for pre op clearance. I will fax over notes as FYI to requesting office. Pt will need to call our office for in office appt.

## 2022-08-03 NOTE — Telephone Encounter (Signed)
Received a call from [facility]. She stated that she has not received pt's pre op clearance. Informed I would like let pre op know, but I realized after the call that pt did not show to last appt and will need to r/s another one. Tried to call her back but unable to get through.

## 2022-08-04 NOTE — Telephone Encounter (Signed)
Noted, no active clearances still in the pool. Will remove from inbox.

## 2022-08-04 NOTE — Telephone Encounter (Signed)
I will update our pre op app as well.

## 2022-08-04 NOTE — Telephone Encounter (Signed)
Received call from [facility], she stated to cancel pre op request. Pt is no longer having surgery.

## 2022-08-05 ENCOUNTER — Encounter (HOSPITAL_BASED_OUTPATIENT_CLINIC_OR_DEPARTMENT_OTHER): Payer: Self-pay | Admitting: Urology

## 2022-08-05 NOTE — Progress Notes (Signed)
Spoke w/ via phone for pre-op interview--- pt Lab needs dos---- Danny Brewer              Lab results------current EKG in epic/ chart COVID test -----patient states asymptomatic no test needed Arrive at ------- 0745 on 08-07-2022 NPO after MN NO Solid Food.  Clear liquids from MN until--- 0645 Med rec completed Medications to take morning of surgery ----- flomax, norvasc, zetia,  Diabetic medication ----- do not take januvia morning of surgery Patient instructed no nail polish to be worn day of surgery Patient instructed to bring photo id and insurance card day of surgery Patient aware to have Driver (ride ) / caregiver    for 24 hours after surgery -- wife Danny Brewer Patient Special Instructions ----- pt stated he had stopped his asa 2 wks ago, last dose 07-22-2022 Pre-Op special Istructions ----- pt has cardiac clearance pre-op note by Nicholes Rough PA 06-23-2022 and Cardiac Thoracic Surgeon Dr Tenny Craw 07-02-2022 in epic/ chart Patient verbalized understanding of instructions that were given at this phone interview. Patient denies shortness of breath, chest pain, fever, cough at this phone interview.

## 2022-08-06 ENCOUNTER — Other Ambulatory Visit: Payer: Self-pay

## 2022-08-06 ENCOUNTER — Encounter (HOSPITAL_COMMUNITY): Payer: Self-pay

## 2022-08-06 ENCOUNTER — Encounter (HOSPITAL_COMMUNITY): Payer: Self-pay | Admitting: Urology

## 2022-08-06 NOTE — Progress Notes (Signed)
COVID Vaccine Completed:  Yes  Date of COVID positive in last 90 days:  PCP - Lurline Del, DO Cardiologist - Sherren Mocha, MD  Chest x-ray - 02-27-22 Epic, CT chest 05-28-22 Epic EKG - 03-02-22 Epic Stress Test - 03-20-22 ECHO - 03-25-22 Epic Cardiac Cath - 10-14-18 Epic Pacemaker/ICD device last checked: Spinal Cord Stimulator: Coronary CT - 10-06-18 Epic  Penile implant  Bowel Prep - N/A  Sleep Study - Yes, neg sleep apnea CPAP - No  A1c 6.2 06-25-22 Fasting Blood Sugar - 77 and 106 Checks Blood Sugar -  patient states that machine is currently broken.  Prior to this was checking daily  Last dose of GLP1 agonist-  N/A GLP1 instructions:  N/A  Last dose of SGLT-2 inhibitors-  N/A SGLT-2 instructions: N/A  Blood Thinner Instructions: Aspirin Instructions:  ASA 81 Last Dose:  Stopped two weeks ago   Activity level:  Can go up a flight of stairs and perform activities of daily living without stopping and without symptoms of chest pain or shortness of breath.  Anesthesia review: S/P CABG 10/2018, CAD, HTN, DM  Patient continues to have intermittent chest pain.  Has seen cardiology regarding this and been evaluated.    Patient denies shortness of breath, fever, cough and chest pain at PAT appointment (completed over the phone)  Patient verbalized understanding of instructions that were given to them at the PAT appointment. Patient was also instructed that they will need to review over the PAT instructions again at home before surgery.

## 2022-08-06 NOTE — Progress Notes (Signed)
Anesthesia Chart Review   Case: [id number] Date/Time: 08/07/22 0946   Procedures:      CYSTOSCOPY WITH RETROGRADE PYELOGRAM, URETEROSCOPY AND STENT PLACEMENT (Left) - 1 HR     HOLMIUM LASER APPLICATION (Left)   Anesthesia type: General   Pre-op diagnosis: LEFT RENAL CALCULUS   [facility]   Surgeons: Alexis Frock, MD       DISCUSSION:52 y.o. never smoker with h/o HTN, DM II, CAD (CABG 2020), CKD, left renal calculus scheduled for above procedure 08/07/2022 with Dr. Alexis Frock.   Pt s/p CABG 2020 with persistent chest wall/sternum pain.  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   Pt last seen by cardiology 05/15/2022. Per OV note, "Preoperative cardiovascular examination Mr. Korbel perioperative risk of a major cardiac event is 0.9% according to the Revised Cardiac Risk Index (RCRI).  Therefore, he is at low risk for perioperative complications.    Recommendations: According to ACC/AHA guidelines, no further cardiovascular testing needed.  The patient may proceed to surgery at acceptable risk.   Antiplatelet and/or Anticoagulation Recommendations: Aspirin can be held for 7 days prior to his surgery.  Please resume Aspirin post operatively when it is felt to be safe from a bleeding standpoint."  Pt same day workup, not seen in PAT clinic.  Evaluate DOS.  VS: Ht '5\' 9"'$  (1.753 m)   Wt 87.1 kg   BMI 28.35 kg/m   PROVIDERS: Lurline Del, DO is PCP   Cardiologist - Sherren Mocha, MD  LABS: Labs reviewed: Acceptable for surgery. (all labs ordered are listed, but only abnormal results are displayed)  Labs Reviewed - No data to display   IMAGES:   EKG:   CV: Myocardial Perfusion 03/20/2022   The study is normal. The study is low risk.   No ST deviation was noted.   LV perfusion is normal. There is no evidence of ischemia. There is no evidence of infarction.   Left ventricular function is abnormal. Global  function is mildly reduced. Nuclear stress EF: 48 %. The left ventricular ejection fraction is mildly decreased (45-54%). End diastolic cavity size is normal. End systolic cavity size is normal.   Prior study not available for comparison.   Normal perfusion Mild systolic dysfunction (EF [id number]) with septal hypokinesis.  Consider echocardiogram for further evaluation Low risk study  Echo 03/25/2022 1. Left ventricular ejection fraction, by estimation, is 60 to 65%. The  left ventricle has normal function. The left ventricle has no regional  wall motion abnormalities. Left ventricular diastolic parameters were  normal.   2. Right ventricular systolic function is normal. The right ventricular  size is normal.   3. The mitral valve is normal in structure. Trivial mitral valve  regurgitation.   4. The aortic valve is tricuspid. Aortic valve regurgitation is not  visualized.  Past Medical History:  Diagnosis Date   BPH (benign prostatic hyperplasia)    Coronary Artery Disease    cardiologist--- dr Burt Knack;    Coronary CTA 09/2018: pLAD 70-90; pD1+pD2 70-90; mRCA mixed plaque - cannot rule out 70-90; FFR suggests hemodynamically significant stenosis // LHC 10/2018: 2v CAD >> s/p  CABG (L-LAD, L radial-RI, S-D2, S-RCA) with Dr. 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   LIMA--LAD;  SVG--D2;  SVG--dRCA;  left radial --- RI   Type 2 diabetes mellitus ([place])    followed by pcp   (08-05-2022 checks blood sugar daily am fasting,  fasting sugar-- 77-100)   Wears glasses     Past Surgical History:  Procedure Laterality Date   CORONARY ARTERY BYPASS GRAFT N/A 10/17/2018   Procedure: CORONARY ARTERY BYPASS GRAFTING (CABG), ON PUMP, TIMES  FOUR, USING LIMA TO LAD, ENDOSCOPICALLY HARVESTED GREATER SAPHENOUS VEIN TO DIAG 2, SVG TO DISTAL RCA, AND LEFT RADIAL TO RAMUS INTERMEDIUS;  Surgeon: Grace Isaac, MD;  Location: [place];  Service: Open Heart Surgery;  Laterality: N/A;   CYSTOSCOPY WITH RETROGRADE PYELOGRAM, URETEROSCOPY AND STENT PLACEMENT Right 12/30/2021   Procedure: CYSTOSCOPY WITH RETROGRADE PYELOGRAM, URETEROSCOPY AND STENT PLACEMENT, BASKET OF STONES;  Surgeon: Alexis Frock, MD;  [facility];  Service: Urology;  Laterality: Right;   ENDOVEIN HARVEST OF GREATER SAPHENOUS VEIN Right 10/17/2018   Procedure: [place] Of Greater Saphenous Vein;  Surgeon: Grace Isaac, MD;  Location: [place];  Service: Open Heart Surgery;  Laterality: Right;   HAND SURGERY Left 1987   complex laceration   INGUINAL HERNIA REPAIR Bilateral 05/27/2015   Procedure: LAPAROSCOPIC BILATERAL  INGUINAL HERNIA REPAIRS WITH MESH;  Surgeon: Michael Boston, MD;  [facility];  Service: General;  Laterality: Bilateral;   KNEE ARTHROSCOPY Left 1991   LEFT HEART CATH AND CORONARY ANGIOGRAPHY N/A 10/14/2018   Procedure: LEFT HEART CATH AND CORONARY ANGIOGRAPHY;  Surgeon: Belva Crome, MD;  Location: [place] [facility];  Service: Cardiovascular;  Laterality: N/A;   PENILE PROSTHESIS IMPLANT N/A 10/03/2020   Procedure: PLACEMENT OF PENILE PROTHESIS INFLATABLE;  Surgeon: Franchot Gallo, MD;  Location: [facility];  Service: Urology;  Laterality: N/A;  pubis area   RADIAL ARTERY HARVEST Left 10/17/2018   Procedure: RADIAL ARTERY HARVEST;  Surgeon: Grace Isaac, MD;  Location: [place];  Service: Open Heart Surgery;  Laterality: Left;   REMOVAL OF PENILE PROSTHESIS N/A 07/07/2021   Procedure: EXPLANT  AND REPLACEMENT OF COLOPLAST TITAN PENILE PROSTHESIS;  Surgeon: Franchot Gallo, MD;  [facility];  Service: Urology;  Laterality: N/A;   SCROTAL EXPLORATION N/A 07/07/2021   Procedure: EXPLORATION OF PENILE  PROSTHESIS;  Surgeon: Franchot Gallo, MD;  [facility];  Service: Urology;  Laterality: N/A;   SHOULDER ARTHROSCOPY Right 07/08/2022   '@SCG'$  by dr supple;   for impingment   SHOULDER CLOSED REDUCTION Left 09/04/2019   Procedure: CLOSED MANIPULATION SHOULDER;  Surgeon: Vickey Huger, MD;  [facility];  Service: Orthopedics;  Laterality: Left;   SHOULDER SURGERY Left 2022   TEE WITHOUT CARDIOVERSION N/A 10/17/2018   Procedure: TRANSESOPHAGEAL ECHOCARDIOGRAM (TEE);  Surgeon: Grace Isaac, MD;  Location: [place];  Service: Open Heart Surgery;  Laterality: N/A;    MEDICATIONS: No current facility-administered medications for this encounter.    acetaminophen (TYLENOL) 500 MG tablet   amLODipine (NORVASC) 10 MG tablet   aspirin 81 MG tablet   ezetimibe (ZETIA) 10 MG tablet   methocarbamol (ROBAXIN) 500 MG tablet   Phenazopyridine HCl (AZO STANDARD MAXIMUM STRENGTH PO)   sitaGLIPtin (JANUVIA) 100 MG tablet   tamsulosin (FLOMAX) 0.4 MG CAPS capsule   Alirocumab (PRALUENT) 75 MG/ML SOAJ   botulinum toxin Type A (BOTOX) 100  units SOLR injection   Konrad Felix [place], PA-C [facility] Testing [phone]

## 2022-08-06 NOTE — Patient Instructions (Signed)
SURGICAL WAITING ROOM VISITATION Patients having surgery or a procedure may have no more than 2 support people in the waiting area - these visitors may rotate.    If the patient needs to stay at the hospital during part of their recovery, the visitor guidelines for inpatient rooms apply. Pre-op nurse will coordinate an appropriate time for 1 support person to accompany patient in pre-op.  This support person may not rotate.    Please refer to the [facility] website for the visitor guidelines for Inpatients (after your surgery is over and you are in a regular room).   Due to an increase in RSV and influenza rates and associated hospitalizations, children ages 40 and under may not visit patients in [place].     Your procedure is scheduled on: 08-07-22   Report to [facility] Main Entrance    Report to admitting at 7:45 AM   Call this number if you have problems the morning of surgery [phone]   Do not eat food or drink liquids :After Midnight.           If you have questions, please contact your surgeon's office.   FOLLOW ANY ADDITIONAL PRE OP INSTRUCTIONS YOU RECEIVED FROM YOUR SURGEON'S OFFICE!!!     Oral Hygiene is also important to reduce your risk of infection.                                    Remember - BRUSH YOUR TEETH THE MORNING OF SURGERY WITH YOUR REGULAR TOOTHPASTE   Do NOT smoke after Midnight   Take these medicines the morning of surgery with A SIP OF WATER:   Amlodipine  Ezetimibe  Tamsulosin  Okay to take Tylenol if needed  How to Manage Your Diabetes Before and After Surgery  Why is it important to control my blood sugar before and after surgery? Improving blood sugar levels before and after surgery helps healing and can limit problems. A way of improving blood sugar control is eating a healthy diet by:  Eating less sugar and carbohydrates  Increasing activity/exercise  Talking with your doctor about reaching your blood sugar  goals High blood sugars (greater than 180 mg/dL) can raise your risk of infections and slow your recovery, so you will need to focus on controlling your diabetes during the weeks before surgery. Make sure that the doctor who takes care of your diabetes knows about your planned surgery including the date and location.  How do I manage my blood sugar before surgery? Check your blood sugar at least 4 times a day, starting 2 days before surgery, to make sure that the level is not too high or low. Check your blood sugar the morning of your surgery when you wake up and every 2 hours until you get to the Short Stay unit. If your blood sugar is less than 70 mg/dL, you will need to treat for low blood sugar: Do not take insulin. Treat a low blood sugar (less than 70 mg/dL) with  cup of clear juice (cranberry or apple), 4 glucose tablets, OR glucose gel. Recheck blood sugar in 15 minutes after treatment (to make sure it is greater than 70 mg/dL). If your blood sugar is not greater than 70 mg/dL on recheck, call [phone] for further instructions. Report your blood sugar to the short stay nurse when you get to Short Stay.  If you are admitted to  the hospital after surgery: Your blood sugar will be checked by the staff and you will probably be given insulin after surgery (instead of oral diabetes medicines) to make sure you have good blood sugar levels. The goal for blood sugar control after surgery is 80-180 mg/dL.   WHAT DO I DO ABOUT MY DIABETES MEDICATION?  Do not take oral diabetes medicines (pills) the morning of surgery (do not take Januvia the day of surgery).  Reviewed and Endorsed by [facility] Patient Education Committee, August 2015                              You may not have any metal on your body including  jewelry, and body piercing             Do not wear lotions, powders, cologne, or deodorant              Men may shave face and neck.   Do not bring valuables to the  hospital. [place].   Contacts, dentures or bridgework may not be worn into surgery. DO NOT [place]. PHARMACY WILL DISPENSE MEDICATIONS LISTED ON YOUR MEDICATION LIST TO YOU DURING YOUR ADMISSION [place]!    Patients discharged on the day of surgery will not be allowed to drive home.  Someone NEEDS to stay with you for the first 24 hours after anesthesia.               Please read over the following fact sheets you were given: IF [place] [phone]   If you received a COVID test during your pre-op visit  it is requested that you wear a mask when out in public, stay away from anyone that may not be feeling well and notify your surgeon if you develop symptoms. If you test positive for Covid or have been in contact with anyone that has tested positive in the last 10 days please notify you surgeon.  [place] - Preparing for Surgery Before surgery, you can play an important role.  Because skin is not sterile, your skin needs to be as free of germs as possible.  You can reduce the number of germs on your skin by washing with CHG (chlorahexidine gluconate) soap before surgery.  CHG is an antiseptic cleaner which kills germs and bonds with the skin to continue killing germs even after washing. Please DO NOT use if you have an allergy to CHG or antibacterial soaps.  If your skin becomes reddened/irritated stop using the CHG and inform your nurse when you arrive at Short Stay. Do not shave (including legs and underarms) for at least 48 hours prior to the first CHG shower.  You may shave your face/neck.  Please follow these instructions carefully: Use antibacterial soap if you do not have CHG   1.  Shower with CHG Soap the night before surgery and the  morning of surgery.  2.  If you choose to wash your hair, wash your hair first as usual with your normal   shampoo.  3.  After you shampoo, rinse your hair and body thoroughly to remove the shampoo.                             4.  Use CHG as you  would any other liquid soap.  You can apply chg directly to the skin and wash.  Gently with a scrungie or clean washcloth.  5.  Apply the CHG Soap to your body ONLY FROM THE NECK DOWN.   Do   not use on face/ open                           Wound or open sores. Avoid contact with eyes, ears mouth and   genitals (private parts).                       Wash face,  Genitals (private parts) with your normal soap.             6.  Wash thoroughly, paying special attention to the area where your    surgery  will be performed.  7.  Thoroughly rinse your body with warm water from the neck down.  8.  DO NOT shower/wash with your normal soap after using and rinsing off the CHG Soap.                9.  Pat yourself dry with a clean towel.            10.  Wear clean pajamas.            11.  Place clean sheets on your bed the night of your first shower and do not  sleep with pets. Day of Surgery : Do not apply any lotions/deodorants the morning of surgery.  Please wear clean clothes to the hospital/surgery center.  FAILURE TO FOLLOW THESE INSTRUCTIONS MAY RESULT IN THE CANCELLATION OF YOUR SURGERY  PATIENT SIGNATURE_________________________________  NURSE SIGNATURE__________________________________  ________________________________________________________________________

## 2022-08-07 ENCOUNTER — Ambulatory Visit (HOSPITAL_COMMUNITY): Payer: Medicaid Other

## 2022-08-07 ENCOUNTER — Encounter (HOSPITAL_COMMUNITY)
Admission: RE | Disposition: A | Discharge status: Home / Self Care | Payer: Self-pay | Source: Ambulatory Visit | Attending: Urology

## 2022-08-07 ENCOUNTER — Other Ambulatory Visit (HOSPITAL_COMMUNITY): Payer: Self-pay

## 2022-08-07 ENCOUNTER — Ambulatory Visit (HOSPITAL_COMMUNITY): Payer: Medicaid Other | Admitting: Physician Assistant

## 2022-08-07 ENCOUNTER — Ambulatory Visit (HOSPITAL_COMMUNITY)
Admission: RE | Admit: 2022-08-07 | Discharge: 2022-08-07 | Disposition: A | Discharge status: Home / Self Care | Payer: Medicaid Other | Source: Ambulatory Visit | Attending: Urology | Admitting: Urology

## 2022-08-07 ENCOUNTER — Encounter (HOSPITAL_COMMUNITY): Payer: Self-pay | Admitting: Urology

## 2022-08-07 ENCOUNTER — Ambulatory Visit (HOSPITAL_BASED_OUTPATIENT_CLINIC_OR_DEPARTMENT_OTHER): Payer: Medicaid Other | Admitting: Physician Assistant

## 2022-08-07 DIAGNOSIS — E119 Type 2 diabetes mellitus without complications: Secondary | ICD-10-CM | POA: Insufficient documentation

## 2022-08-07 DIAGNOSIS — I10 Essential (primary) hypertension: Secondary | ICD-10-CM

## 2022-08-07 DIAGNOSIS — Z7984 Long term (current) use of oral hypoglycemic drugs: Secondary | ICD-10-CM

## 2022-08-07 DIAGNOSIS — Z951 Presence of aortocoronary bypass graft: Secondary | ICD-10-CM | POA: Insufficient documentation

## 2022-08-07 DIAGNOSIS — Z79899 Other long term (current) drug therapy: Secondary | ICD-10-CM | POA: Insufficient documentation

## 2022-08-07 DIAGNOSIS — I2510 Atherosclerotic heart disease of native coronary artery without angina pectoris: Secondary | ICD-10-CM

## 2022-08-07 DIAGNOSIS — Z960 Presence of urogenital implants: Secondary | ICD-10-CM | POA: Insufficient documentation

## 2022-08-07 DIAGNOSIS — Z01818 Encounter for other preprocedural examination: Secondary | ICD-10-CM

## 2022-08-07 DIAGNOSIS — N200 Calculus of kidney: Secondary | ICD-10-CM | POA: Diagnosis not present

## 2022-08-07 DIAGNOSIS — G4730 Sleep apnea, unspecified: Secondary | ICD-10-CM | POA: Insufficient documentation

## 2022-08-07 HISTORY — PX: HOLMIUM LASER APPLICATION: SHX5852

## 2022-08-07 HISTORY — DX: Calculus of kidney: N20.0

## 2022-08-07 HISTORY — PX: CYSTOSCOPY WITH RETROGRADE PYELOGRAM, URETEROSCOPY AND STENT PLACEMENT: SHX5789

## 2022-08-07 LAB — GLUCOSE, CAPILLARY
Glucose-Capillary: 105 mg/dL — ABNORMAL HIGH (ref 70–99)
Glucose-Capillary: 113 mg/dL — ABNORMAL HIGH (ref 70–99)

## 2022-08-07 SURGERY — CYSTOURETEROSCOPY, WITH RETROGRADE PYELOGRAM AND STENT INSERTION
Anesthesia: General | Laterality: Left

## 2022-08-07 MED ORDER — ACETAMINOPHEN 10 MG/ML IV SOLN
INTRAVENOUS | Status: AC
  Filled 2022-08-07: qty 100

## 2022-08-07 MED ORDER — PROPOFOL 10 MG/ML IV BOLUS
INTRAVENOUS | Status: AC
  Filled 2022-08-07: qty 20

## 2022-08-07 MED ORDER — KETOROLAC TROMETHAMINE 10 MG PO TABS: 10.0000 mg | ORAL_TABLET | Freq: Three times a day (TID) | ORAL | 0 refills | Status: DC | PRN

## 2022-08-07 MED ORDER — CHLORHEXIDINE GLUCONATE 0.12 % MT SOLN
15.0000 mL | Freq: Once | OROMUCOSAL | Status: AC
  Administered 2022-08-07: 15 mL via OROMUCOSAL

## 2022-08-07 MED ORDER — EPHEDRINE 5 MG/ML INJ
INTRAVENOUS | Status: AC
  Filled 2022-08-07: qty 5

## 2022-08-07 MED ORDER — HYDROMORPHONE HCL 1 MG/ML IJ SOLN
INTRAMUSCULAR | Status: AC
  Administered 2022-08-07: 0.5 mg via INTRAVENOUS
  Filled 2022-08-07: qty 1

## 2022-08-07 MED ORDER — IOHEXOL 300 MG/ML  SOLN
INTRAMUSCULAR | Status: DC | PRN
  Administered 2022-08-07: 13 mL

## 2022-08-07 MED ORDER — OXYCODONE HCL 5 MG/5ML PO SOLN: 5.0000 mg | Freq: Once | ORAL | Status: DC | PRN

## 2022-08-07 MED ORDER — ORAL CARE MOUTH RINSE: 15.0000 mL | Freq: Once | OROMUCOSAL | Status: AC

## 2022-08-07 MED ORDER — FENTANYL CITRATE (PF) 100 MCG/2ML IJ SOLN
INTRAMUSCULAR | Status: AC
  Filled 2022-08-07: qty 2

## 2022-08-07 MED ORDER — ACETAMINOPHEN 10 MG/ML IV SOLN
1000.0000 mg | Freq: Once | INTRAVENOUS | Status: AC
  Administered 2022-08-07: 1000 mg via INTRAVENOUS

## 2022-08-07 MED ORDER — DEXAMETHASONE SODIUM PHOSPHATE 10 MG/ML IJ SOLN
INTRAMUSCULAR | Status: AC
  Filled 2022-08-07: qty 1

## 2022-08-07 MED ORDER — SENNOSIDES-DOCUSATE SODIUM 8.6-50 MG PO TABS: 1.0000 | ORAL_TABLET | Freq: Two times a day (BID) | ORAL | 0 refills | Status: DC

## 2022-08-07 MED ORDER — IPRATROPIUM-ALBUTEROL 0.5-2.5 (3) MG/3ML IN SOLN
3.0000 mL | Freq: Once | RESPIRATORY_TRACT | Status: AC
  Administered 2022-08-07: 3 mL via RESPIRATORY_TRACT

## 2022-08-07 MED ORDER — AMISULPRIDE (ANTIEMETIC) 5 MG/2ML IV SOLN
INTRAVENOUS | Status: AC
  Administered 2022-08-07: 10 mg via INTRAVENOUS
  Filled 2022-08-07: qty 4

## 2022-08-07 MED ORDER — HYDROMORPHONE HCL 1 MG/ML IJ SOLN
INTRAMUSCULAR | Status: AC
  Administered 2022-08-07: 0.25 mg via INTRAVENOUS
  Filled 2022-08-07: qty 1

## 2022-08-07 MED ORDER — CEPHALEXIN 500 MG PO CAPS: 500.0000 mg | ORAL_CAPSULE | Freq: Two times a day (BID) | ORAL | 0 refills | Status: DC

## 2022-08-07 MED ORDER — MIDAZOLAM HCL 2 MG/2ML IJ SOLN
INTRAMUSCULAR | Status: AC
  Filled 2022-08-07: qty 2

## 2022-08-07 MED ORDER — PROPOFOL 10 MG/ML IV BOLUS
INTRAVENOUS | Status: DC | PRN
  Administered 2022-08-07: 200 mg via INTRAVENOUS
  Administered 2022-08-07: 100 mg via INTRAVENOUS

## 2022-08-07 MED ORDER — LIDOCAINE HCL (PF) 2 % IJ SOLN
INTRAMUSCULAR | Status: AC
  Filled 2022-08-07: qty 5

## 2022-08-07 MED ORDER — PHENYLEPHRINE 80 MCG/ML (10ML) SYRINGE FOR IV PUSH (FOR BLOOD PRESSURE SUPPORT)
PREFILLED_SYRINGE | INTRAVENOUS | Status: AC
  Filled 2022-08-07: qty 10

## 2022-08-07 MED ORDER — 0.9 % SODIUM CHLORIDE (POUR BTL) OPTIME
TOPICAL | Status: DC | PRN
  Administered 2022-08-07: 1000 mL

## 2022-08-07 MED ORDER — ONDANSETRON HCL 4 MG/2ML IJ SOLN
INTRAMUSCULAR | Status: AC
  Filled 2022-08-07: qty 2

## 2022-08-07 MED ORDER — PHENYLEPHRINE HCL (PRESSORS) 10 MG/ML IV SOLN
INTRAVENOUS | Status: DC | PRN
  Administered 2022-08-07 (×3): 80 ug via INTRAVENOUS

## 2022-08-07 MED ORDER — HYDROMORPHONE HCL 1 MG/ML IJ SOLN
0.2500 mg | INTRAMUSCULAR | Status: DC | PRN
  Administered 2022-08-07: 0.25 mg via INTRAVENOUS
  Administered 2022-08-07 (×2): 0.5 mg via INTRAVENOUS

## 2022-08-07 MED ORDER — AMISULPRIDE (ANTIEMETIC) 5 MG/2ML IV SOLN: 10.0000 mg | Freq: Once | INTRAVENOUS | Status: AC | PRN

## 2022-08-07 MED ORDER — LIDOCAINE 2% (20 MG/ML) 5 ML SYRINGE
INTRAMUSCULAR | Status: DC | PRN
  Administered 2022-08-07: 60 mg via INTRAVENOUS

## 2022-08-07 MED ORDER — DEXAMETHASONE SODIUM PHOSPHATE 10 MG/ML IJ SOLN
INTRAMUSCULAR | Status: DC | PRN
  Administered 2022-08-07: 5 mg via INTRAVENOUS

## 2022-08-07 MED ORDER — OXYCODONE HCL 5 MG PO TABS: 5.0000 mg | ORAL_TABLET | Freq: Four times a day (QID) | ORAL | 0 refills | Status: DC | PRN

## 2022-08-07 MED ORDER — EPHEDRINE SULFATE (PRESSORS) 50 MG/ML IJ SOLN
INTRAMUSCULAR | Status: DC | PRN
  Administered 2022-08-07: 7.5 mg via INTRAVENOUS

## 2022-08-07 MED ORDER — CIPROFLOXACIN IN D5W 400 MG/200ML IV SOLN
400.0000 mg | INTRAVENOUS | Status: AC
  Administered 2022-08-07: 400 mg via INTRAVENOUS
  Filled 2022-08-07: qty 200

## 2022-08-07 MED ORDER — DEXMEDETOMIDINE HCL IN NACL 200 MCG/50ML IV SOLN
INTRAVENOUS | Status: DC | PRN
  Administered 2022-08-07: 4 ug via INTRAVENOUS
  Administered 2022-08-07: 8 ug via INTRAVENOUS

## 2022-08-07 MED ORDER — IPRATROPIUM-ALBUTEROL 0.5-2.5 (3) MG/3ML IN SOLN
RESPIRATORY_TRACT | Status: AC
  Filled 2022-08-07: qty 3

## 2022-08-07 MED ORDER — MIDAZOLAM HCL 2 MG/2ML IJ SOLN
INTRAMUSCULAR | Status: DC | PRN
  Administered 2022-08-07: 2 mg via INTRAVENOUS

## 2022-08-07 MED ORDER — OXYCODONE HCL 5 MG PO TABS: 5.0000 mg | ORAL_TABLET | Freq: Once | ORAL | Status: DC | PRN

## 2022-08-07 MED ORDER — LACTATED RINGERS IV SOLN: INTRAVENOUS | Status: DC

## 2022-08-07 MED ORDER — FENTANYL CITRATE (PF) 100 MCG/2ML IJ SOLN
INTRAMUSCULAR | Status: DC | PRN
  Administered 2022-08-07: 100 ug via INTRAVENOUS

## 2022-08-07 MED ORDER — KETOROLAC TROMETHAMINE 30 MG/ML IJ SOLN
INTRAMUSCULAR | Status: AC
  Filled 2022-08-07: qty 1

## 2022-08-07 MED ORDER — KETOROLAC TROMETHAMINE 30 MG/ML IJ SOLN
30.0000 mg | Freq: Once | INTRAMUSCULAR | Status: AC
  Administered 2022-08-07: 30 mg via INTRAVENOUS

## 2022-08-07 MED ORDER — ONDANSETRON HCL 4 MG/2ML IJ SOLN
INTRAMUSCULAR | Status: DC | PRN
  Administered 2022-08-07: 4 mg via INTRAVENOUS

## 2022-08-07 MED ORDER — DEXMEDETOMIDINE HCL IN NACL 80 MCG/20ML IV SOLN
INTRAVENOUS | Status: AC
  Filled 2022-08-07: qty 20

## 2022-08-07 MED ORDER — SODIUM CHLORIDE 0.9 % IR SOLN
Status: DC | PRN
  Administered 2022-08-07: 3000 mL

## 2022-08-07 SURGICAL SUPPLY — 25 items
BAG URO CATCHER STRL LF (MISCELLANEOUS) ×1 IMPLANT
BASKET LASER NITINOL 1.9FR (BASKET) IMPLANT
BSKT STON RTRVL 120 1.9FR (BASKET) ×2
CATH URETL OPEN END 6FR 70 (CATHETERS) ×1 IMPLANT
CLOTH BEACON ORANGE TIMEOUT ST (SAFETY) ×1 IMPLANT
EXTRACTOR STONE 1.7FRX115CM (UROLOGICAL SUPPLIES) IMPLANT
GLOVE SURG LX STRL 7.5 STRW (GLOVE) ×1 IMPLANT
GOWN STRL REUS W/ TWL XL LVL3 (GOWN DISPOSABLE) ×1 IMPLANT
GOWN STRL REUS W/TWL XL LVL3 (GOWN DISPOSABLE) ×1
GUIDEWIRE ANG ZIPWIRE 038X150 (WIRE) ×1 IMPLANT
GUIDEWIRE STR DUAL SENSOR (WIRE) ×1 IMPLANT
KIT TURNOVER KIT A (KITS) IMPLANT
LASER FIB FLEXIVA PULSE ID 365 (Laser) IMPLANT
LASER FIB FLEXIVA PULSE ID 550 (Laser) IMPLANT
LASER FIB FLEXIVA PULSE ID 910 (Laser) IMPLANT
MANIFOLD NEPTUNE II (INSTRUMENTS) ×1 IMPLANT
PACK CYSTO (CUSTOM PROCEDURE TRAY) ×1 IMPLANT
SHEATH NAVIGATOR HD 11/13X28 (SHEATH) IMPLANT
SHEATH NAVIGATOR HD 11/13X36 (SHEATH) IMPLANT
STENT POLARIS 5FRX26 (STENTS) IMPLANT
TRACTIP FLEXIVA PULS ID 200XHI (Laser) IMPLANT
TRACTIP FLEXIVA PULSE ID 200 (Laser)
TUBE PU 8FR 16IN ENFIT (TUBING) ×1 IMPLANT
TUBING CONNECTING 10 (TUBING) ×1 IMPLANT
TUBING UROLOGY SET (TUBING) ×1 IMPLANT

## 2022-08-07 NOTE — Discharge Instructions (Addendum)
1 - You may have urinary urgency (bladder spasms) and bloody urine on / off with stent in place. This is normal.  2 - Remove tethered stent on Monday morning at home by pulling on string, then blue-white plastic tubing, and discarding. Office is open Monday if any issues arise.   3 - Call MD or go to ER for fever >102, severe pain / nausea / vomiting not relieved by medications, or acute change in medical status

## 2022-08-07 NOTE — Anesthesia Preprocedure Evaluation (Signed)
Anesthesia Evaluation  Patient identified by MRN, date of birth, ID band Patient awake    Reviewed: Allergy & Precautions, H&P , NPO status , Patient's Chart, lab work & pertinent test results  Airway Mallampati: II   Neck ROM: full    Dental   Pulmonary sleep apnea    breath sounds clear to auscultation       Cardiovascular hypertension, Pt. on medications + CAD and + CABG   Rhythm:regular Rate:Normal     Neuro/Psych    GI/Hepatic ,GERD  ,,  Endo/Other  diabetes, Type 2    Renal/GU      Musculoskeletal  (+) Arthritis , Osteoarthritis,    Abdominal   Peds  Hematology   Anesthesia Other Findings   Reproductive/Obstetrics                             Anesthesia Physical Anesthesia Plan  ASA: 3  Anesthesia Plan: General   Post-op Pain Management: Dilaudid IV   Induction: Intravenous  PONV Risk Score and Plan: 2 and Ondansetron, Midazolam and Treatment may vary due to age or medical condition  Airway Management Planned: LMA  Additional Equipment:   Intra-op Plan:   Post-operative Plan: Extubation in OR  Informed Consent: I have reviewed the patients History and Physical, chart, labs and discussed the procedure including the risks, benefits and alternatives for the proposed anesthesia with the patient or authorized representative who has indicated his/her understanding and acceptance.     Dental advisory given  Plan Discussed with: CRNA, Anesthesiologist and Surgeon  Anesthesia Plan Comments:         Anesthesia Quick Evaluation

## 2022-08-07 NOTE — Brief Op Note (Signed)
08/07/2022  10:07 AM  PATIENT:  Danny Brewer  52 y.o. male  PRE-OPERATIVE DIAGNOSIS:  LEFT RENAL CALCULUS  POST-OPERATIVE DIAGNOSIS:  LEFT RENAL CALCULUS  PROCEDURE:  Procedure(s) with comments: CYSTOSCOPY WITH RETROGRADE PYELOGRAM, URETEROSCOPY AND STENT PLACEMENT (Left) - 1 HR HOLMIUM LASER APPLICATION (Left)  SURGEON:  Surgeon(s) and Role:    Alexis Frock, MD - Primary  PHYSICIAN ASSISTANT:   ASSISTANTS: none   ANESTHESIA:   general  EBL:  minimal   BLOOD ADMINISTERED:none  DRAINS: none   LOCAL MEDICATIONS USED:  NONE  SPECIMEN:  Source of Specimen:  left renal stone fragments  DISPOSITION OF SPECIMEN:   [facility] for compositional analysis  COUNTS:  YES  TOURNIQUET:  * No tourniquets in log *  DICTATION: .Other Dictation: Dictation Number [id number]  PLAN OF CARE: Discharge to home after PACU  PATIENT DISPOSITION:  PACU - hemodynamically stable.   Delay start of Pharmacological VTE agent (>24hrs) due to surgical blood loss or risk of bleeding: yes

## 2022-08-07 NOTE — Telephone Encounter (Signed)
Pharmacy Patient Advocate Encounter   Received notification from [facility] that prior authorization for Botox 100 units is required/requested.   PA submitted on 08/07/2022 to (ins) [employer] via CoverMyMeds [employer] or [facility]) confirmation # [id number] Status is pending

## 2022-08-07 NOTE — OR Nursing (Signed)
Stone taken by Dr. Tresa Moore

## 2022-08-07 NOTE — H&P (Signed)
Danny Brewer is an 52 y.o. male.    Chief Complaint: Pre-Op LEFT Ureteroscopic Stone Manipulation  HPI:   1 - Recurrent Urolithiasis -  Pre 2023 - medical passage x several  12/2021 - right URS for distal stone with refractory colic, punctate left renal only additional (still present on chest CT 1/2024_)   2 - Medical Stone Disease -  Eval 2023: BMP, PTH, Urate - normal; Composition - 100% CaOx; 24 Hr Urines - pending    PMH sig for CAD/CABG (inhis early 35s, follows [employer] cards, no blood thinners / limitations), biateral inguinal hernia repair with mesh.   Today "Danny Brewer" is seen to proceed with LEFT ureteroscopic stone manipulation for small renal stone with possible ball-valve intermitant obstruction. No interval fevers. Most recent UA without infectious parameters.    Past Medical History:  Diagnosis Date   BPH (benign prostatic hyperplasia)    Coronary Artery Disease    cardiologist--- dr Burt Knack;    Coronary CTA 09/2018: pLAD 70-90; pD1+pD2 70-90; mRCA mixed plaque - cannot rule out 70-90; FFR suggests hemodynamically significant stenosis // LHC 10/2018: 2v CAD >> s/p  CABG (L-LAD, L radial-RI, S-D2, S-RCA) with Dr. 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   LIMA--LAD;  SVG--D2;  SVG--dRCA;  left radial --- RI   Type 2 diabetes mellitus ([place])    followed by pcp   (08-05-2022 checks blood sugar daily am fasting,  fasting sugar-- 77-100)   Wears glasses     Past Surgical History:  Procedure Laterality Date   CORONARY ARTERY BYPASS GRAFT N/A 10/17/2018   Procedure: CORONARY ARTERY BYPASS GRAFTING (CABG), ON PUMP, TIMES FOUR, USING LIMA TO LAD, ENDOSCOPICALLY HARVESTED GREATER SAPHENOUS VEIN TO DIAG 2, SVG TO  DISTAL RCA, AND LEFT RADIAL TO RAMUS INTERMEDIUS;  Surgeon: Grace Isaac, MD;  Location: [place];  Service: Open Heart Surgery;  Laterality: N/A;   CYSTOSCOPY WITH RETROGRADE PYELOGRAM, URETEROSCOPY AND STENT PLACEMENT Right 12/30/2021   Procedure: CYSTOSCOPY WITH RETROGRADE PYELOGRAM, URETEROSCOPY AND STENT PLACEMENT, BASKET OF STONES;  Surgeon: Alexis Frock, MD;  Location: [facility];  Service: Urology;  Laterality: Right;   ENDOVEIN HARVEST OF GREATER SAPHENOUS VEIN Right 10/17/2018   Procedure: [place] Of Greater Saphenous Vein;  Surgeon: Grace Isaac, MD;  Location: [place];  Service: Open Heart Surgery;  Laterality: Right;   HAND SURGERY Left 1987   complex laceration   INGUINAL HERNIA REPAIR Bilateral 05/27/2015   Procedure: LAPAROSCOPIC BILATERAL  INGUINAL HERNIA REPAIRS WITH MESH;  Surgeon: Michael Boston, MD;  Location: [facility];  Service: General;  Laterality: Bilateral;   KNEE ARTHROSCOPY Left 1991   LEFT HEART CATH AND CORONARY ANGIOGRAPHY N/A 10/14/2018   Procedure: LEFT HEART CATH AND CORONARY ANGIOGRAPHY;  Surgeon: Belva Crome, MD;  Location: [place] [facility];  Service: Cardiovascular;  Laterality: N/A;   PENILE PROSTHESIS IMPLANT N/A 10/03/2020   Procedure: PLACEMENT OF PENILE PROTHESIS INFLATABLE;  Surgeon: Franchot Gallo, MD;  Location: [facility];  Service: Urology;  Laterality: N/A;  pubis area   RADIAL ARTERY HARVEST Left 10/17/2018   Procedure: RADIAL ARTERY  HARVEST;  Surgeon: Grace Isaac, MD;  Location: [place];  Service: Open Heart Surgery;  Laterality: Left;   REMOVAL OF PENILE PROSTHESIS N/A 07/07/2021   Procedure: EXPLANT  AND REPLACEMENT OF COLOPLAST TITAN PENILE PROSTHESIS;  Surgeon: Franchot Gallo, MD;  Location: [facility];  Service: Urology;  Laterality: N/A;   SCROTAL EXPLORATION N/A 07/07/2021   Procedure: EXPLORATION OF PENILE PROSTHESIS;  Surgeon: Franchot Gallo, MD;  Location: [facility];  Service: Urology;   Laterality: N/A;   SHOULDER ARTHROSCOPY Right 07/08/2022   '@[facility]'$  by dr supple;   for impingment   SHOULDER CLOSED REDUCTION Left 09/04/2019   Procedure: CLOSED MANIPULATION SHOULDER;  Surgeon: Vickey Huger, MD;  Location: [facility];  Service: Orthopedics;  Laterality: Left;   SHOULDER SURGERY Left 2022   TEE WITHOUT CARDIOVERSION N/A 10/17/2018   Procedure: TRANSESOPHAGEAL ECHOCARDIOGRAM (TEE);  Surgeon: Grace Isaac, MD;  Location: [place];  Service: Open Heart Surgery;  Laterality: N/A;    Family History  Problem Relation Age of Onset   Hypertension Mother    Cancer Father    Heart failure Father    Sudden death Brother    Heart attack Brother    Hypertension Sister    Heart murmur Sister    Social History:  reports that he has never smoked. He has never used smokeless tobacco. He reports that he does not drink alcohol and does not use drugs.  Allergies:  Allergies  Allergen Reactions   Antihistamines, Diphenhydramine-Type Hives and Other (See Comments)    Seizures   Bactrim [Sulfamethoxazole-Trimethoprim] Itching    Throat itching and suspected pill esophagitis   Demerol Hives   Morphine And Related Hives    Esp with high dose morphine, possibly Dilaudid Tolerates hydrocodone, oxycodone   Compazine [Prochlorperazine] Other (See Comments)    Spastic movement   Metoprolol     Per patient caused "severe headache"   Statins Other (See Comments)    myalgias    No medications prior to admission.    No results found for this or any previous visit (from the past 48 hour(s)). No results found.  Review of Systems  Constitutional:  Negative for chills and fever.  All other systems reviewed and are negative.   Height '5\' 9"'$  (1.753 m), weight 87.1 kg. Physical Exam Vitals reviewed.  HENT:     Head: Normocephalic.  Eyes:     Pupils: Pupils are equal, round, and reactive to light.  Cardiovascular:     Rate and Rhythm: Normal rate.  Pulmonary:     Effort: Pulmonary  effort is normal.  Abdominal:     General: Abdomen is flat.  Genitourinary:    Comments: No significant CVAT at present Musculoskeletal:        General: Normal range of motion.     Cervical back: Normal range of motion.  Neurological:     General: No focal deficit present.     Mental Status: He is alert.  Psychiatric:        Mood and Affect: Mood normal.      Assessment/Plan  Proceed as planned with LEFT ureteroscopic stone manipulation. Risks, benefits, alternatives, expected peri-op course discussed previously and reiterated today.   Alexis Frock, MD 08/07/2022, 5:20 AM

## 2022-08-07 NOTE — Anesthesia Procedure Notes (Signed)
Procedure Name: LMA Insertion Date/Time: 08/07/2022 9:21 AM  Performed by: Randye Lobo, CRNAPre-anesthesia Checklist: Patient identified, Emergency Drugs available, Suction available and Patient being monitored Patient Re-evaluated:Patient Re-evaluated prior to induction Oxygen Delivery Method: Circle System Utilized Preoxygenation: Pre-oxygenation with 100% oxygen Induction Type: IV induction Ventilation: Mask ventilation without difficulty LMA: LMA inserted LMA Size: 4.0 and 5.0 Number of attempts: 1 Airway Equipment and Method: Bite block Placement Confirmation: positive ETCO2 Tube secured with: Tape Dental Injury: Teeth and Oropharynx as per pre-operative assessment

## 2022-08-07 NOTE — Transfer of Care (Signed)
Immediate Anesthesia Transfer of Care Note  Patient: Danny Brewer  Procedure(s) Performed: CYSTOSCOPY WITH RETROGRADE PYELOGRAM, URETEROSCOPY AND STENT PLACEMENT (Left) HOLMIUM LASER APPLICATION (Left)  Patient Location: PACU  Anesthesia Type:General  Level of Consciousness: sedated and responds to stimulation  Airway & Oxygen Therapy: Patient Spontanous Breathing and Patient connected to face mask oxygen  Post-op Assessment: Report given to RN and Post -op Vital signs reviewed and stable  Post vital signs: Reviewed and stable  Last Vitals:  Vitals Value Taken Time  BP 106/78 08/07/22 1030  Temp    Pulse 87 08/07/22 1033  Resp 28 08/07/22 1033  SpO2 98 % 08/07/22 1033  Vitals shown include unvalidated device data.  Last Pain:  Vitals:   08/07/22 0826  PainSc: 0-No pain         Complications: No notable events documented.

## 2022-08-07 NOTE — Anesthesia Postprocedure Evaluation (Signed)
Anesthesia Post Note  Patient: Danny Brewer  Procedure(s) Performed: CYSTOSCOPY WITH RETROGRADE PYELOGRAM, URETEROSCOPY AND STENT PLACEMENT (Left) HOLMIUM LASER APPLICATION (Left)     Patient location during evaluation: PACU Anesthesia Type: General Level of consciousness: awake and alert Pain management: pain level controlled Vital Signs Assessment: post-procedure vital signs reviewed and stable Respiratory status: spontaneous breathing, nonlabored ventilation and respiratory function stable Cardiovascular status: blood pressure returned to baseline and stable Postop Assessment: no apparent nausea or vomiting Anesthetic complications: no   No notable events documented.  Last Vitals:  Vitals:   08/07/22 1200 08/07/22 1215  BP: (!) 143/87 107/70  Pulse: 97   Resp: 16   Temp: 36.7 C   SpO2: 92% 92%    Last Pain:  Vitals:   08/07/22 1215  PainSc: 2                  Lynda Rainwater

## 2022-08-07 NOTE — Op Note (Signed)
Danny Brewer, [facility] MEDICAL RECORD NO: [id number] ACCOUNT NO: [id number] DATE OF BIRTH: [birth year] FACILITY: [facility] LOCATION: [facility] PHYSICIAN: Alexis Frock, MD  Operative Report   DATE OF PROCEDURE: 08/07/2022  PREOPERATIVE DIAGNOSES:  Left renal stone, recurrent urolithiasis.  PROCEDURE PERFORMED:  1.  Cystoscopy with left retrograde pyelogram interpretation. 2.  Left ureteroscopy with laser lithotripsy. 3.  Insertion of left ureteral stent.  ESTIMATED BLOOD LOSS:  Nil.  COMPLICATIONS:  None.  SPECIMEN:  Left renal stone fragments for composition analysis.  FINDINGS:  1.  Multifocal papillary tip calcifications, dominant upper mid calix, approximately 6 mm.  Second focus, lower mid calix, approximately 3 mm. 2.  Complete resolution of all accessible stone fragments larger than one-third mm following laser lithotripsy and basket extraction. 3.  Successful placement of left ureteral stent, proximal end in the renal pelvis, distal end in urinary bladder, with tether.  INDICATIONS:  The patient is a pleasant 52 year old man with history of recurrent urolithiasis.  He has not completed metabolic evaluation that was recommended. He was found incidentally on chest imaging to have a left upper pole renal stone.  He does  have occasional pain on the left side that he is quite concerned may be intermittent ball valve obstruction from the stone.  He has not had a convincing hydronephrosis on prior studies.  Options were discussed for management including surveillance versus  active therapy with shockwave lithotripsy versus ureteroscopy.  Given his known previous multifocality, he wished to proceed with ureteroscopy with goal of stone free.  Informed consent was obtained and placed in medical record.  PROCEDURE IN DETAIL:  The patient being verified, procedure being left ureteroscopic stone manipulation was confirmed.  Procedure timeout was performed.  Intravenous antibiotics were  administered.  General LMA anesthesia induced.  The patient was placed  into a low lithotomy position.  Sterile field was created, prepped and draped the patient's penis, perineum, and proximal thighs using iodine.  Cystourethroscopy was performed using 21-French rigid cystoscope with offset lens.  Inspection of anterior and  posterior urethra was unremarkable.  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 Urinary bladder was also unremarkable, no diverticula, calcifications or papillary lesions.   Left ureteral orifice was cannulated with 6-French end-hole catheter, and a left retrograde pyelogram was obtained.  Left retrograde pyelogram demonstrated single left ureter, single system left kidney.  No obvious filling defects or narrowing noted.  No hydronephrosis noted.  A 0.038 ZIPwire was advanced to the level of the upper pole, set aside as a safety wire.  An  8-French feeding tube placed in the urinary bladder for pressure release and semirigid ureteroscopy was performed of the distal fourth of the ureter alongside a separate sensor working wire.  Ureter above this was not able to be inspected given the  angulation caused by his penile prosthesis.  Semirigid scope was then exchanged for a medium length ureteral access sheath to the level of mid ureter using continuous fluoroscopic guidance and flexible digital ureteroscopy was performed of the proximal  left ureter and systematic inspection of left kidney including all calices x3.  There were two foci of stone, one in the upper mid calix, appeared to be approximately 6 mm and free floating within a very narrow infundibulum, calix and another focus,  approximately 3 mm papillary tip in lower mid calix.  The lower stone was amenable to simple basketing.  It was removed and set aside for composition analysis.  The upper stone owing to his very narrow infundibulum was not amenable  to simple basketing  and holmium laser energy applied to stone using setting of 0.2 joules and 20 Hz, fragmented into approximately three smaller pieces that were then sequentially removed, set aside for composition analysis. Following this complete resolution of all  accessible stone fragments larger than one-third mm, excellent hemostasis, no evidence of any perforation, access sheath was removed under continuous vision, no significant mucosal abnormalities were found.  Given access sheath usage, it was felt that  brief interval stenting with tethered stent would be most prudent.  As such, a new 5 x 26 Polaris type stent was placed over the remaining safety wire using fluoroscopic guidance, good proximal and distal planes were noted.  Tether was left in place,  fashioned to the dorsum of penis.  The procedure was terminated.  The patient tolerated procedure well, no immediate periprocedural complications.  The patient was taken to postanesthesia care in stable condition.  Plan for discharge home.   SHW D: 08/07/2022 10:12:43 am T: 08/07/2022 11:06:00 am  JOB: [id number]

## 2022-08-08 ENCOUNTER — Emergency Department (HOSPITAL_COMMUNITY): Payer: Medicaid Other

## 2022-08-08 ENCOUNTER — Encounter (HOSPITAL_COMMUNITY): Payer: Self-pay

## 2022-08-08 ENCOUNTER — Inpatient Hospital Stay (HOSPITAL_COMMUNITY)
Admission: EM | Admit: 2022-08-08 | Discharge: 2022-08-15 | DRG: 988 | Disposition: A | Discharge status: Home / Self Care | Payer: Medicaid Other | Attending: Internal Medicine | Admitting: Internal Medicine

## 2022-08-08 ENCOUNTER — Other Ambulatory Visit: Payer: Self-pay

## 2022-08-08 DIAGNOSIS — N209 Urinary calculus, unspecified: Secondary | ICD-10-CM | POA: Diagnosis present

## 2022-08-08 DIAGNOSIS — J810 Acute pulmonary edema: Principal | ICD-10-CM | POA: Diagnosis present

## 2022-08-08 DIAGNOSIS — N400 Enlarged prostate without lower urinary tract symptoms: Secondary | ICD-10-CM | POA: Diagnosis present

## 2022-08-08 DIAGNOSIS — I2510 Atherosclerotic heart disease of native coronary artery without angina pectoris: Secondary | ICD-10-CM | POA: Diagnosis present

## 2022-08-08 DIAGNOSIS — Z951 Presence of aortocoronary bypass graft: Secondary | ICD-10-CM | POA: Diagnosis not present

## 2022-08-08 DIAGNOSIS — K5900 Constipation, unspecified: Secondary | ICD-10-CM | POA: Diagnosis not present

## 2022-08-08 DIAGNOSIS — Z888 Allergy status to other drugs, medicaments and biological substances status: Secondary | ICD-10-CM | POA: Diagnosis not present

## 2022-08-08 DIAGNOSIS — K219 Gastro-esophageal reflux disease without esophagitis: Secondary | ICD-10-CM | POA: Diagnosis present

## 2022-08-08 DIAGNOSIS — T502X5A Adverse effect of carbonic-anhydrase inhibitors, benzothiadiazides and other diuretics, initial encounter: Secondary | ICD-10-CM | POA: Diagnosis not present

## 2022-08-08 DIAGNOSIS — R042 Hemoptysis: Secondary | ICD-10-CM | POA: Diagnosis not present

## 2022-08-08 DIAGNOSIS — Z960 Presence of urogenital implants: Secondary | ICD-10-CM | POA: Diagnosis present

## 2022-08-08 DIAGNOSIS — Z8249 Family history of ischemic heart disease and other diseases of the circulatory system: Secondary | ICD-10-CM | POA: Diagnosis not present

## 2022-08-08 DIAGNOSIS — Z881 Allergy status to other antibiotic agents status: Secondary | ICD-10-CM | POA: Diagnosis not present

## 2022-08-08 DIAGNOSIS — Z79899 Other long term (current) drug therapy: Secondary | ICD-10-CM | POA: Diagnosis not present

## 2022-08-08 DIAGNOSIS — E663 Overweight: Secondary | ICD-10-CM | POA: Diagnosis present

## 2022-08-08 DIAGNOSIS — N529 Male erectile dysfunction, unspecified: Secondary | ICD-10-CM | POA: Diagnosis present

## 2022-08-08 DIAGNOSIS — Z6828 Body mass index (BMI) 28.0-28.9, adult: Secondary | ICD-10-CM

## 2022-08-08 DIAGNOSIS — Z885 Allergy status to narcotic agent status: Secondary | ICD-10-CM

## 2022-08-08 DIAGNOSIS — Z7984 Long term (current) use of oral hypoglycemic drugs: Secondary | ICD-10-CM | POA: Diagnosis not present

## 2022-08-08 DIAGNOSIS — Z7982 Long term (current) use of aspirin: Secondary | ICD-10-CM

## 2022-08-08 DIAGNOSIS — E785 Hyperlipidemia, unspecified: Secondary | ICD-10-CM | POA: Diagnosis present

## 2022-08-08 DIAGNOSIS — Z1152 Encounter for screening for COVID-19: Secondary | ICD-10-CM

## 2022-08-08 DIAGNOSIS — E119 Type 2 diabetes mellitus without complications: Secondary | ICD-10-CM | POA: Diagnosis present

## 2022-08-08 DIAGNOSIS — N179 Acute kidney failure, unspecified: Secondary | ICD-10-CM | POA: Diagnosis not present

## 2022-08-08 DIAGNOSIS — I10 Essential (primary) hypertension: Secondary | ICD-10-CM | POA: Diagnosis present

## 2022-08-08 LAB — COMPREHENSIVE METABOLIC PANEL
ALT: 14 U/L (ref 0–44)
AST: 19 U/L (ref 15–41)
Albumin: 3.3 g/dL — ABNORMAL LOW (ref 3.5–5.0)
Alkaline Phosphatase: 46 U/L (ref 38–126)
Anion gap: 8 (ref 5–15)
BUN: 22 mg/dL — ABNORMAL HIGH (ref 6–20)
CO2: 22 mmol/L (ref 22–32)
Calcium: 8.4 mg/dL — ABNORMAL LOW (ref 8.9–10.3)
Chloride: 106 mmol/L (ref 98–111)
Creatinine, Ser: 1.47 mg/dL — ABNORMAL HIGH (ref 0.61–1.24)
GFR, Estimated: 57 mL/min — ABNORMAL LOW (ref >60)
Glucose, Bld: 118 mg/dL — ABNORMAL HIGH (ref 70–99)
Potassium: 3.9 mmol/L (ref 3.5–5.1)
Sodium: 136 mmol/L (ref 135–145)
Total Bilirubin: 0.4 mg/dL (ref 0.3–1.2)
Total Protein: 6.2 g/dL — ABNORMAL LOW (ref 6.5–8.1)

## 2022-08-08 LAB — URINALYSIS, ROUTINE W REFLEX MICROSCOPIC
Bacteria, UA: NONE SEEN
Bilirubin Urine: NEGATIVE
Glucose, UA: NEGATIVE mg/dL
Ketones, ur: NEGATIVE mg/dL
Nitrite: NEGATIVE
Protein, ur: NEGATIVE mg/dL
RBC / HPF: >50 RBC/hpf (ref 0–5)
Specific Gravity, Urine: 1.011 (ref 1.005–1.030)
pH: 6 (ref 5.0–8.0)

## 2022-08-08 LAB — RESPIRATORY PANEL BY PCR

## 2022-08-08 LAB — DIC (DISSEMINATED INTRAVASCULAR COAGULATION)PANEL
D-Dimer, Quant: 0.69 ug/mL-FEU — ABNORMAL HIGH (ref 0.00–0.50)
Fibrinogen: 314 mg/dL (ref 210–475)
INR: 1.1 (ref 0.8–1.2)
Platelets: 200 10*3/uL (ref 150–400)
Prothrombin Time: 13.6 seconds (ref 11.4–15.2)
Smear Review: NONE SEEN
aPTT: 25 seconds (ref 24–36)

## 2022-08-08 LAB — I-STAT CHEM 8, ED
BUN: 31 mg/dL — ABNORMAL HIGH (ref 6–20)
Calcium, Ion: 1.09 mmol/L — ABNORMAL LOW (ref 1.15–1.40)
Chloride: 104 mmol/L (ref 98–111)
Creatinine, Ser: 1.5 mg/dL — ABNORMAL HIGH (ref 0.61–1.24)
Glucose, Bld: 125 mg/dL — ABNORMAL HIGH (ref 70–99)
HCT: 38 % — ABNORMAL LOW (ref 39.0–52.0)
Hemoglobin: 12.9 g/dL — ABNORMAL LOW (ref 13.0–17.0)
Potassium: 5.3 mmol/L — ABNORMAL HIGH (ref 3.5–5.1)
Sodium: 135 mmol/L (ref 135–145)
TCO2: 23 mmol/L (ref 22–32)

## 2022-08-08 LAB — PROCALCITONIN: Procalcitonin: 0.25 ng/mL

## 2022-08-08 LAB — HIV ANTIBODY (ROUTINE TESTING W REFLEX): HIV Screen 4th Generation wRfx: NONREACTIVE

## 2022-08-08 LAB — BRAIN NATRIURETIC PEPTIDE: B Natriuretic Peptide: 33 pg/mL (ref 0.0–100.0)

## 2022-08-08 LAB — TROPONIN I (HIGH SENSITIVITY)
Troponin I (High Sensitivity): 7 ng/L (ref <18)
Troponin I (High Sensitivity): 7 ng/L (ref <18)

## 2022-08-08 LAB — GLUCOSE, CAPILLARY
Glucose-Capillary: 86 mg/dL (ref 70–99)
Glucose-Capillary: 85 mg/dL (ref 70–99)
Glucose-Capillary: 108 mg/dL — ABNORMAL HIGH (ref 70–99)

## 2022-08-08 LAB — CBG MONITORING, ED: Glucose-Capillary: 95 mg/dL (ref 70–99)

## 2022-08-08 LAB — LACTIC ACID, PLASMA: Lactic Acid, Venous: 1.6 mmol/L (ref 0.5–1.9)

## 2022-08-08 MED ORDER — ACETAMINOPHEN 325 MG PO TABS
650.0000 mg | ORAL_TABLET | Freq: Four times a day (QID) | ORAL | Status: DC | PRN
  Administered 2022-08-08 – 2022-08-11 (×2): 650 mg via ORAL
  Filled 2022-08-08 (×2): qty 2

## 2022-08-08 MED ORDER — ORAL CARE MOUTH RINSE: 15.0000 mL | OROMUCOSAL | Status: DC | PRN

## 2022-08-08 MED ORDER — AMLODIPINE BESYLATE 10 MG PO TABS
10.0000 mg | ORAL_TABLET | Freq: Every day | ORAL | Status: DC
  Administered 2022-08-08 – 2022-08-15 (×8): 10 mg via ORAL
  Filled 2022-08-08 (×8): qty 1

## 2022-08-08 MED ORDER — TRAMADOL HCL 50 MG PO TABS
25.0000 mg | ORAL_TABLET | Freq: Four times a day (QID) | ORAL | Status: AC
  Administered 2022-08-08 – 2022-08-09 (×2): 25 mg via ORAL
  Filled 2022-08-08 (×2): qty 1

## 2022-08-08 MED ORDER — INSULIN ASPART 100 UNIT/ML IJ SOLN
0.0000 [IU] | Freq: Three times a day (TID) | INTRAMUSCULAR | Status: DC
  Administered 2022-08-10: 3 [IU] via SUBCUTANEOUS
  Administered 2022-08-11: 2 [IU] via SUBCUTANEOUS
  Filled 2022-08-08: qty 0.15

## 2022-08-08 MED ORDER — SODIUM CHLORIDE 0.9 % IV SOLN: INTRAVENOUS | Status: DC

## 2022-08-08 MED ORDER — SODIUM CHLORIDE 0.9 % IV SOLN
2.0000 g | Freq: Three times a day (TID) | INTRAVENOUS | Status: AC
  Administered 2022-08-08 – 2022-08-11 (×12): 2 g via INTRAVENOUS
  Filled 2022-08-08 (×12): qty 12.5

## 2022-08-08 MED ORDER — FUROSEMIDE 10 MG/ML IJ SOLN
20.0000 mg | Freq: Once | INTRAMUSCULAR | Status: AC
  Administered 2022-08-08: 20 mg via INTRAVENOUS
  Filled 2022-08-08: qty 4

## 2022-08-08 MED ORDER — POLYETHYLENE GLYCOL 3350 17 G PO PACK
17.0000 g | PACK | Freq: Every day | ORAL | Status: DC | PRN
  Administered 2022-08-09 – 2022-08-11 (×2): 17 g via ORAL
  Filled 2022-08-08 (×2): qty 1

## 2022-08-08 MED ORDER — INSULIN ASPART 100 UNIT/ML IJ SOLN
0.0000 [IU] | Freq: Every day | INTRAMUSCULAR | Status: DC
  Filled 2022-08-08: qty 0.05

## 2022-08-08 MED ORDER — OXYCODONE HCL 5 MG PO TABS
5.0000 mg | ORAL_TABLET | Freq: Four times a day (QID) | ORAL | Status: DC | PRN
  Administered 2022-08-10 – 2022-08-14 (×5): 5 mg via ORAL
  Filled 2022-08-08 (×5): qty 1

## 2022-08-08 MED ORDER — ONDANSETRON HCL 4 MG PO TABS
4.0000 mg | ORAL_TABLET | Freq: Four times a day (QID) | ORAL | Status: DC | PRN
  Administered 2022-08-09: 4 mg via ORAL

## 2022-08-08 MED ORDER — ACETAMINOPHEN 650 MG RE SUPP: 650.0000 mg | Freq: Four times a day (QID) | RECTAL | Status: DC | PRN

## 2022-08-08 MED ORDER — SODIUM CHLORIDE (PF) 0.9 % IJ SOLN
INTRAMUSCULAR | Status: AC
  Filled 2022-08-08: qty 50

## 2022-08-08 MED ORDER — PANTOPRAZOLE SODIUM 40 MG PO TBEC
40.0000 mg | DELAYED_RELEASE_TABLET | Freq: Every day | ORAL | Status: DC
  Administered 2022-08-08 – 2022-08-15 (×8): 40 mg via ORAL
  Filled 2022-08-08 (×8): qty 1

## 2022-08-08 MED ORDER — BISACODYL 5 MG PO TBEC
5.0000 mg | DELAYED_RELEASE_TABLET | Freq: Once | ORAL | Status: AC | PRN
  Administered 2022-08-12: 5 mg via ORAL
  Filled 2022-08-08: qty 1

## 2022-08-08 MED ORDER — ONDANSETRON HCL 4 MG/2ML IJ SOLN
4.0000 mg | Freq: Four times a day (QID) | INTRAMUSCULAR | Status: DC | PRN
  Administered 2022-08-11: 4 mg via INTRAVENOUS
  Filled 2022-08-08 (×2): qty 2

## 2022-08-08 MED ORDER — TRAMADOL HCL 50 MG PO TABS: 25.0000 mg | ORAL_TABLET | Freq: Four times a day (QID) | ORAL | Status: DC

## 2022-08-08 MED ORDER — FENTANYL CITRATE PF 50 MCG/ML IJ SOSY
50.0000 ug | PREFILLED_SYRINGE | Freq: Once | INTRAMUSCULAR | Status: AC
  Administered 2022-08-08: 50 ug via INTRAVENOUS
  Filled 2022-08-08: qty 1

## 2022-08-08 MED ORDER — IOHEXOL 350 MG/ML SOLN
100.0000 mL | Freq: Once | INTRAVENOUS | Status: AC | PRN
  Administered 2022-08-08: 100 mL via INTRAVENOUS

## 2022-08-08 MED ORDER — DOCUSATE SODIUM 100 MG PO CAPS
100.0000 mg | ORAL_CAPSULE | Freq: Two times a day (BID) | ORAL | Status: DC
  Administered 2022-08-08 – 2022-08-15 (×15): 100 mg via ORAL
  Filled 2022-08-08 (×15): qty 1

## 2022-08-08 MED ORDER — PIPERACILLIN-TAZOBACTAM 3.375 G IVPB 30 MIN
3.3750 g | Freq: Once | INTRAVENOUS | Status: AC
  Administered 2022-08-08: 3.375 g via INTRAVENOUS
  Filled 2022-08-08: qty 50

## 2022-08-08 MED ORDER — TAMSULOSIN HCL 0.4 MG PO CAPS
0.4000 mg | ORAL_CAPSULE | Freq: Every day | ORAL | Status: DC
  Administered 2022-08-08 – 2022-08-15 (×8): 0.4 mg via ORAL
  Filled 2022-08-08 (×8): qty 1

## 2022-08-08 NOTE — H&P (Signed)
History and Physical  Danny Brewer [id number] DOB: [birth year] DOA: 08/08/2022   PCP: Lurline Del, DO   Patient coming from: Home   Chief Complaint: Chest discomfort, coughing up blood.   HPI: Danny Brewer is a 52 y.o. male with medical history significant for BPH, coronary artery disease, GERD, history of kidney stones, hypertension, non-insulin-dependent type 2 diabetes be admitted to the hospital with complaints of hemoptysis, chest discomfort after cystoscopy and stent placement for obstructing renal stone yesterday 3/1.  History is provided by the patient and his wife, who states that his cystoscopy procedure went well, however afterwards he was having little bit of trouble breathing, he remembers being given multiple breathing treatments, and stayed in PACU for several hours until he was stabilized.  He recalls having an episode of hemoptysis while in the postprocedure area.  Otherwise did well, was discharged home on room air.  At about 2 in the morning, he started getting chest discomfort, coughing up some blood.  The chest discomfort is sharp, pleuritic with deep breaths.  Came to the ER for evaluation.  ED Course: In the ER, he has remained on room air, lab work is relatively unremarkable, with stable renal function, stable white blood cell count, he had 2 negative troponins, normal BNP, hemoglobin stable at 12.  Imaging as noted below.  He is sitting comfortably on the stretcher in the ER with his wife at the bedside.  He looks calm and comfortable, able to speak in full sentences.  Review of Systems: Please see HPI for pertinent positives and negatives. A complete 10 system review of systems are otherwise negative.  Past Medical History:  Diagnosis Date   BPH (benign prostatic hyperplasia)    Coronary Artery Disease    cardiologist--- dr Burt Knack;    Coronary CTA 09/2018: pLAD 70-90; pD1+pD2 70-90; mRCA mixed plaque - cannot rule out 70-90; FFR suggests hemodynamically significant  stenosis // LHC 10/2018: 2v CAD >> s/p  CABG (L-LAD, L radial-RI, S-D2, S-RCA) with Dr. 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   LIMA--LAD;  SVG--D2;  SVG--dRCA;  left radial --- RI   Type 2 diabetes mellitus ([place])    followed by pcp   (08-05-2022 checks blood sugar daily am fasting,  fasting sugar-- 77-100)   Wears glasses    Past Surgical History:  Procedure Laterality Date   CORONARY ARTERY BYPASS GRAFT N/A 10/17/2018   Procedure: CORONARY ARTERY BYPASS GRAFTING (CABG), ON PUMP, TIMES FOUR, USING LIMA TO LAD, ENDOSCOPICALLY HARVESTED GREATER SAPHENOUS VEIN TO DIAG 2, SVG TO DISTAL RCA, AND LEFT RADIAL TO RAMUS INTERMEDIUS;  Surgeon: Grace Isaac, MD;  Location: [facility];  Service: Open Heart Surgery;  Laterality: N/A;   CYSTOSCOPY WITH RETROGRADE PYELOGRAM, URETEROSCOPY AND STENT PLACEMENT Right 12/30/2021   Procedure: CYSTOSCOPY WITH RETROGRADE PYELOGRAM, URETEROSCOPY AND STENT PLACEMENT, BASKET OF STONES;  Surgeon: Alexis Frock, MD;  Location: [facility];  Service: Urology;  Laterality: Right;   CYSTOSCOPY WITH RETROGRADE PYELOGRAM, URETEROSCOPY AND STENT PLACEMENT Left 08/07/2022   Procedure: CYSTOSCOPY WITH RETROGRADE PYELOGRAM, URETEROSCOPY AND STENT PLACEMENT;  Surgeon: Alexis Frock, MD;  Location: [facility];  Service: Urology;  Laterality: Left;  1 HR   ENDOVEIN HARVEST OF GREATER SAPHENOUS VEIN Right 10/17/2018   Procedure: [place] Of Greater Saphenous Vein;  Surgeon: Grace Isaac, MD;  Location: [place] [facility];  Service: Open Heart Surgery;  Laterality: Right;   HAND SURGERY Left 1987   complex laceration   HOLMIUM LASER APPLICATION Left [id number]   Procedure: HOLMIUM LASER APPLICATION;  Surgeon: Alexis Frock, MD;  Location: [facility];  Service: Urology;  Laterality: Left;   INGUINAL HERNIA REPAIR Bilateral 05/27/2015   Procedure: LAPAROSCOPIC BILATERAL  INGUINAL HERNIA REPAIRS WITH MESH;  Surgeon: Michael Boston, MD;  Location: [facility];  Service: General;  Laterality: Bilateral;   KNEE ARTHROSCOPY Left 1991   LEFT HEART CATH AND CORONARY ANGIOGRAPHY N/A 10/14/2018   Procedure: LEFT HEART CATH AND CORONARY ANGIOGRAPHY;  Surgeon: Belva Crome, MD;  Location: [facility];  Service: Cardiovascular;  Laterality: N/A;   PENILE PROSTHESIS IMPLANT N/A 10/03/2020   Procedure: PLACEMENT OF PENILE PROTHESIS INFLATABLE;  Surgeon: Franchot Gallo, MD;  Location: [facility];  Service: Urology;  Laterality: N/A;  pubis area   RADIAL ARTERY HARVEST Left 10/17/2018   Procedure: RADIAL ARTERY HARVEST;  Surgeon: Grace Isaac, MD;  Location: [facility];  Service: Open Heart Surgery;  Laterality: Left;   REMOVAL OF PENILE PROSTHESIS N/A 07/07/2021   Procedure: EXPLANT  AND REPLACEMENT OF COLOPLAST TITAN PENILE PROSTHESIS;  Surgeon: Franchot Gallo, MD;  Location: [facility];  Service: Urology;  Laterality: N/A;   SCROTAL EXPLORATION N/A 07/07/2021   Procedure: EXPLORATION OF PENILE PROSTHESIS;  Surgeon: Franchot Gallo, MD;  Location: [facility];  Service: Urology;  Laterality: N/A;   SHOULDER ARTHROSCOPY Right 07/08/2022   '@[facility]'$  by dr supple;   for impingment   SHOULDER CLOSED REDUCTION Left 09/04/2019   Procedure: CLOSED MANIPULATION SHOULDER;  Surgeon: Vickey Huger, MD;  Location: [facility];  Service: Orthopedics;  Laterality: Left;   SHOULDER SURGERY Left 2022   TEE WITHOUT CARDIOVERSION N/A 10/17/2018   Procedure: TRANSESOPHAGEAL ECHOCARDIOGRAM (TEE);  Surgeon: Grace Isaac, MD;  Location: [facility];  Service: Open Heart Surgery;  Laterality: N/A;    Social History:  reports that he has never smoked. He has never used smokeless tobacco. He reports that he does not drink alcohol and  does not use drugs.   Allergies  Allergen Reactions   Antihistamines, Diphenhydramine-Type Hives and Other (See Comments)    Seizures   Bactrim [Sulfamethoxazole-Trimethoprim] Itching    Throat itching and suspected pill esophagitis   Demerol Hives   Morphine And Related Hives    Esp with high dose morphine, possibly Dilaudid Tolerates hydrocodone, oxycodone   Compazine [Prochlorperazine] Other (See Comments)    Spastic movement   Metoprolol     Per patient caused "severe headache"   Oxycodone     Alters Personality   Statins Other (See Comments)    myalgias    Family History  Problem Relation Age of Onset   Hypertension Mother    Cancer Father    Heart failure Father    Sudden death Brother    Heart attack Brother    Hypertension Sister    Heart murmur Sister      Prior to Admission medications   Medication Sig Start Date End Date Taking? Authorizing Provider  acetaminophen (TYLENOL) 500 MG tablet Take 2 tablets (1,000 mg total) by mouth every 6 (six) hours. Patient taking differently: Take 1,000 mg by mouth every 6 (six) hours  as needed for moderate pain. 07/08/21  Yes Franchot Gallo, MD  aspirin 81 MG tablet Take 1 tablet (81 mg total) by mouth daily. 10/11/18  Yes Weaver, Scott T, PA-C  cephALEXin (KEFLEX) 500 MG capsule Take 1 capsule (500 mg total) by mouth 2 (two) times daily for 3 days. To prevent infection with tethered stent. 08/07/22 08/10/22 Yes Alexis Frock, MD  sitaGLIPtin (JANUVIA) 100 MG tablet Take 100 mg by mouth daily.   Yes [provider]  tamsulosin (FLOMAX) 0.4 MG CAPS capsule Take 0.4 mg by mouth daily.   Yes [provider]  Alirocumab (PRALUENT) 75 MG/ML SOAJ INJECT 1 ML INTO THE SKIN EVERY 14 (FOURTEEN) DAYS. Patient not taking: Reported on 07/02/2022 02/24/21   Sherren Mocha, MD  amLODipine (NORVASC) 10 MG tablet Take 1 tablet (10 mg total) by mouth daily. Patient taking differently: Take 10 mg by mouth daily. 05/15/22    Richardson Dopp T, PA-C  botulinum toxin Type A (BOTOX) 100 units SOLR injection Inject 100 Units into the muscle every 3 (three) months. To be administered by MD. Patient not taking: Reported on 08/05/2022 03/09/22   Sater, Nanine Means, MD  ezetimibe (ZETIA) 10 MG tablet Take 1 tablet (10 mg total) by mouth daily. 03/26/22   Richardson Dopp T, PA-C  ketorolac (TORADOL) 10 MG tablet Take 1 tablet (10 mg total) by mouth every 8 (eight) hours as needed for moderate pain (or stent discomfort post-operatively). 08/07/22   Alexis Frock, MD  oxyCODONE (ROXICODONE) 5 MG immediate release tablet Take 1 tablet (5 mg total) by mouth every 6 (six) hours as needed for severe pain (post-operatively). 08/07/22 08/07/23  Alexis Frock, MD  senna-docusate (SENOKOT-S) 8.6-50 MG tablet Take 1 tablet by mouth 2 (two) times daily. While taking strongest pain meds to prevent constipation 08/07/22   Alexis Frock, MD    Physical Exam: BP (!) 129/92   Pulse (!) 103   Temp 98.4 F (36.9 C) (Oral)   Resp (!) 25   Ht '5\' 9"'$  (1.753 m)   Wt 87.1 kg   SpO2 92%   BMI 28.35 kg/m   General:  Alert, oriented, calm, in no acute distress, he looks comfortable and nontoxic Eyes: EOMI, clear conjuctivae, white sclerea Neck: supple, no masses, trachea mildline  Cardiovascular: RRR, no murmurs or rubs, no peripheral edema  Respiratory: clear to auscultation bilaterally, no wheezes, no crackles  Abdomen: soft, nontender, nondistended, normal bowel tones heard  Skin: dry, no rashes  Musculoskeletal: no joint effusions, normal range of motion  Psychiatric: appropriate affect, normal speech  Neurologic: extraocular muscles intact, clear speech, moving all extremities with intact sensorium          Labs on Admission:  Basic Metabolic Panel: Recent Labs  Lab 08/08/22 0259 08/08/22 0345  NA 135 136  K 5.3* 3.9  CL 104 106  CO2  --  22  GLUCOSE 125* 118*  BUN 31* 22*  CREATININE 1.50* 1.47*  CALCIUM  --  8.4*   Liver  Function Tests: Recent Labs  Lab 08/08/22 0345  AST 19  ALT 14  ALKPHOS 46  BILITOT 0.4  PROT 6.2*  ALBUMIN 3.3*   No results for input(s): "LIPASE", "AMYLASE" in the last 168 hours. No results for input(s): "AMMONIA" in the last 168 hours. CBC: Recent Labs  Lab 08/08/22 0251 08/08/22 0259  WBC 8.7  --   HGB 11.7* 12.9*  HCT 38.3* 38.0*  MCV 90.3  --   PLT 30*  --  Cardiac Enzymes: No results for input(s): "CKTOTAL", "CKMB", "CKMBINDEX", "TROPONINI" in the last 168 hours.  BNP (last 3 results) Recent Labs    08/08/22 0345  BNP 33.0    ProBNP (last 3 results) No results for input(s): "PROBNP" in the last 8760 hours.  CBG: Recent Labs  Lab 08/07/22 0821 08/07/22 1028  GLUCAP 105* 113*    Radiological Exams on Admission: CT Angio Chest PE W/Cm &/Or Wo Cm  Result Date: 08/08/2022 CLINICAL DATA:  Postop abdominal pain. Pulmonary embolism suspected, high probability. Hemoptysis for 2 hours. EXAM: CT ANGIOGRAPHY CHEST CT ABDOMEN AND PELVIS WITH CONTRAST TECHNIQUE: Multidetector CT imaging of the chest was performed using the standard protocol during bolus administration of intravenous contrast. Multiplanar CT image reconstructions and MIPs were obtained to evaluate the vascular anatomy. Multidetector CT imaging of the abdomen and pelvis was performed using the standard protocol during bolus administration of intravenous contrast. RADIATION DOSE REDUCTION: This exam was performed according to the departmental dose-optimization program which includes automated exposure control, adjustment of the mA and/or kV according to patient size and/or use of iterative reconstruction technique. CONTRAST:  165m OMNIPAQUE IOHEXOL 350 MG/ML SOLN COMPARISON:  05/28/2022. FINDINGS: CTA CHEST FINDINGS Cardiovascular: The heart is normal in size and there is no pericardial effusion. Scattered coronary artery calcifications are noted. The aorta and pulmonary trunk are normal in caliber. No  pulmonary artery filling defect is seen. Mediastinum/Nodes: No enlarged mediastinal, hilar, or axillary lymph nodes. Thyroid gland, trachea, and esophagus demonstrate no significant findings. Lungs/Pleura: Scattered ground-glass opacities are noted in the lungs bilaterally with a central predominance. No effusion or pneumothorax. Musculoskeletal: Sternotomy wires are noted. No acute osseous abnormality. Review of the MIP images confirms the above findings. CT ABDOMEN and PELVIS FINDINGS Hepatobiliary: No focal liver abnormality is seen. No gallstones, gallbladder wall thickening, or biliary dilatation. Pancreas: Unremarkable. No pancreatic ductal dilatation or surrounding inflammatory changes. Spleen: Normal in size without focal abnormality. Adrenals/Urinary Tract: No adrenal nodule or mass. The kidneys enhance symmetrically. A few punctate renal calculi are noted bilaterally. A left ureteral stent is in place and terminates in the prostate gland. No ureteral calculus. Small focus of air is noted in the urinary bladder which may be iatrogenic. Stomach/Bowel: Stomach is within normal limits. Appendix appears normal. No evidence of bowel wall thickening, distention, or inflammatory changes. No free air or pneumatosis. Vascular/Lymphatic: No significant vascular findings are present. No enlarged abdominal or pelvic lymph nodes. Reproductive: The prostate gland is normal in size. A penile prosthesis is noted with reservoir in the right lower quadrant. Other: No abdominopelvic ascites. Musculoskeletal: Degenerative changes in the lumbar spine. No acute osseous abnormality. Review of the MIP images confirms the above findings. IMPRESSION: 1. No evidence of pulmonary embolism. 2. Diffuse scattered ground-glass opacities in the lungs bilaterally with a central predominance, possible edema versus multifocal pneumonia. 3. No acute process in the abdomen and pelvis. 4. Punctate renal calculi bilaterally with no obstructive  uropathy. A left ureteral stent is in place which terminates in the prostate gland. 5. Remaining incidental findings as described above. Electronically Signed   By: LBrett FairyM.D.   On: 08/08/2022 04:36   CT Abdomen Pelvis W Contrast  Result Date: 08/08/2022 CLINICAL DATA:  Postop abdominal pain. Pulmonary embolism suspected, high probability. Hemoptysis for 2 hours. EXAM: CT ANGIOGRAPHY CHEST CT ABDOMEN AND PELVIS WITH CONTRAST TECHNIQUE: Multidetector CT imaging of the chest was performed using the standard protocol during bolus administration of intravenous contrast. Multiplanar CT image  reconstructions and MIPs were obtained to evaluate the vascular anatomy. Multidetector CT imaging of the abdomen and pelvis was performed using the standard protocol during bolus administration of intravenous contrast. RADIATION DOSE REDUCTION: This exam was performed according to the departmental dose-optimization program which includes automated exposure control, adjustment of the mA and/or kV according to patient size and/or use of iterative reconstruction technique. CONTRAST:  1109m OMNIPAQUE IOHEXOL 350 MG/ML SOLN COMPARISON:  05/28/2022. FINDINGS: CTA CHEST FINDINGS Cardiovascular: The heart is normal in size and there is no pericardial effusion. Scattered coronary artery calcifications are noted. The aorta and pulmonary trunk are normal in caliber. No pulmonary artery filling defect is seen. Mediastinum/Nodes: No enlarged mediastinal, hilar, or axillary lymph nodes. Thyroid gland, trachea, and esophagus demonstrate no significant findings. Lungs/Pleura: Scattered ground-glass opacities are noted in the lungs bilaterally with a central predominance. No effusion or pneumothorax. Musculoskeletal: Sternotomy wires are noted. No acute osseous abnormality. Review of the MIP images confirms the above findings. CT ABDOMEN and PELVIS FINDINGS Hepatobiliary: No focal liver abnormality is seen. No gallstones, gallbladder wall  thickening, or biliary dilatation. Pancreas: Unremarkable. No pancreatic ductal dilatation or surrounding inflammatory changes. Spleen: Normal in size without focal abnormality. Adrenals/Urinary Tract: No adrenal nodule or mass. The kidneys enhance symmetrically. A few punctate renal calculi are noted bilaterally. A left ureteral stent is in place and terminates in the prostate gland. No ureteral calculus. Small focus of air is noted in the urinary bladder which may be iatrogenic. Stomach/Bowel: Stomach is within normal limits. Appendix appears normal. No evidence of bowel wall thickening, distention, or inflammatory changes. No free air or pneumatosis. Vascular/Lymphatic: No significant vascular findings are present. No enlarged abdominal or pelvic lymph nodes. Reproductive: The prostate gland is normal in size. A penile prosthesis is noted with reservoir in the right lower quadrant. Other: No abdominopelvic ascites. Musculoskeletal: Degenerative changes in the lumbar spine. No acute osseous abnormality. Review of the MIP images confirms the above findings. IMPRESSION: 1. No evidence of pulmonary embolism. 2. Diffuse scattered ground-glass opacities in the lungs bilaterally with a central predominance, possible edema versus multifocal pneumonia. 3. No acute process in the abdomen and pelvis. 4. Punctate renal calculi bilaterally with no obstructive uropathy. A left ureteral stent is in place which terminates in the prostate gland. 5. Remaining incidental findings as described above. Electronically Signed   By: LBrett FairyM.D.   On: 08/08/2022 04:36   DG Chest Port 1 View  Result Date: 08/08/2022 CLINICAL DATA:  Chest pain EXAM: PORTABLE CHEST 1 VIEW COMPARISON:  02/27/2022 FINDINGS: Cardiac shadow is stable. Postsurgical changes are again seen. Lungs are well aerated bilaterally. Central increased perihilar density is noted consistent with mild pulmonary edema. No sizable effusion is seen. No acute bony  abnormality is noted. IMPRESSION: Mild increased perihilar density consistent with pulmonary edema. Electronically Signed   By: MInez CatalinaM.D.   On: 08/08/2022 03:06   DG C-Arm 1-60 Min-No Report  Result Date: 08/07/2022 Fluoroscopy was utilized by the requesting physician.  No radiographic interpretation.    Assessment/Plan Principal Problem:   Hemoptysis -unclear etiology, differential is broad at this time, including DIC, anesthesia effect, atypical infection, atypical pulmonary edema, viral infection.  -Observation admission -Supplemental oxygen as needed, the patient is currently comfortable on room air -Empiric IV cefepime; check procalcitonin, lactate and respiratory viral swab -Clear liquid diet for now, can advance later this afternoon if stable Active Problems:   GERD (gastroesophageal reflux disease) -oral PPI   Coronary artery  disease   Essential hypertension -continue home amlodipine   Hyperlipidemia   Type 2 diabetes mellitus without complication, without long-term current use of insulin (HCC) -diabetic diet when eating, with sliding scale insulin   S/P cystoscopy with ureteral stent placement -continue Flomax   DVT prophylaxis: SCDs  Code Status: Full code  Family Communication: Plan of care discussed in detail with the patient and his wife at the bedside in the ER at the time of admission.  They are in agreement with observation admission, and all questions were answered to their satisfaction  Disposition Plan: Likely home in the next 24 hours  Consults called: None  Admission status: Observation  Time spent: 34 minutes  Bow Buntyn Neva Seat MD [facility] Pager [phone]  If 7PM-7AM, please contact night-coverage [email or web address] Password [facility]  08/08/2022, 7:23 AM

## 2022-08-08 NOTE — ED Triage Notes (Signed)
Pt states that he had kidney stones removed today and that now his chest is hurting, his stomach is hurting, he is coughing up blood, and that he has a headache.

## 2022-08-08 NOTE — ED Notes (Signed)
ED TO INPATIENT HANDOFF REPORT  Name/Age/Gender Danny Brewer 52 y.o. male  Code Status    Code Status Orders  (From admission, onward)           Start     Ordered   08/08/22 0729  Full code  Continuous       Question:  By:  Answer:  Consent: discussion documented in EHR   08/08/22 0728           Code Status History     Date Active Date Inactive Code Status Order ID Comments User Context   10/14/2018 0929 10/26/2018 1456 Full Code [id number]  Belva Crome, MD Inpatient   09/18/2018 2103 09/19/2018 1834 Full Code [id number]  Elie Confer, MD ED   09/18/2018 2053 09/18/2018 2103 Full Code [id number]  Elie Confer, MD ED   08/26/2015 0449 08/28/2015 1825 Full Code [id number]  Danford, Suann Larry, MD ED       Home/SNF/Other Home  Chief Complaint Hemoptysis [R04.2]  Level of Care/Admitting Diagnosis ED Disposition     ED Disposition  Admit   Condition  --   [facility]: [place] [[id number]]  Level of Care: Progressive [102]  Admit to Progressive based on following criteria: RESPIRATORY PROBLEMS hypoxemic/hypercapnic respiratory failure that is responsive to NIPPV (BiPAP) or High Flow Nasal Cannula (6-80 lpm). Frequent assessment/intervention, no > Q2 hrs < Q4 hrs, to maintain oxygenation and pulmonary hygiene.  May place patient in observation at [facility] or [place] if equivalent level of care is available:: Yes  Covid Evaluation: Symptomatic Person Under Investigation (PUI) or recent exposure (last 10 days) *Testing Required*  Diagnosis: Hemoptysis [id number]  Admitting Physician: Lucillie Garfinkel [id number]  Attending Physician: Hollice Gong, MIR Kari.Conine GP:[id number]          Medical History Past Medical History:  Diagnosis Date   BPH (benign prostatic hyperplasia)    Coronary Artery Disease    cardiologist--- dr Burt Knack;    Coronary CTA 09/2018: pLAD 70-90; pD1+pD2 70-90; mRCA mixed plaque - cannot rule out 70-90; FFR  suggests hemodynamically significant stenosis // LHC 10/2018: 2v CAD >> s/p  CABG (L-LAD, L radial-RI, S-D2, S-RCA) with Dr. 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   LIMA--LAD;  SVG--D2;  SVG--dRCA;  left radial --- RI   Type 2 diabetes mellitus ([place])    followed by pcp   (08-05-2022 checks blood sugar daily am fasting,  fasting sugar-- 77-100)   Wears glasses     Allergies Allergies  Allergen Reactions   Antihistamines, Diphenhydramine-Type Hives and Other (See Comments)    Seizures   Bactrim [Sulfamethoxazole-Trimethoprim] Itching    Throat itching and suspected pill esophagitis   Demerol Hives   Morphine And Related Hives    Esp with high dose morphine, possibly Dilaudid Tolerates hydrocodone, oxycodone   Compazine [Prochlorperazine] Other (See Comments)    Spastic movement   Metoprolol     Per patient caused "severe headache"   Oxycodone     Alters Personality   Statins Other (See Comments)    myalgias    IV Location/Drains/Wounds  Patient Lines/Drains/Airways Status     Active Line/Drains/Airways     Name Placement date Placement time Site Days   Peripheral IV 08/08/22 20 G 1" Anterior;Left Forearm 08/08/22  0254  Forearm  less than 1   Peripheral IV 08/08/22 22 G 1" Anterior;Distal;Right;Upper Arm 08/08/22  0619  Arm  less than 1   Ureteral Drain/Stent Right ureter 5 Fr. 12/30/21  1822  Right ureter  221   Ureteral Drain/Stent Left ureter 5 Fr. 08/07/22  1003  Left ureter  1            Labs/Imaging Results for orders placed or performed during the hospital encounter of 08/08/22 (from the past 48 hour(s))  CBC     Status: Abnormal   Collection Time: 08/08/22  2:51 AM  Result Value Ref Range    WBC 8.7 4.0 - 10.5 K/uL   RBC 4.24 4.22 - 5.81 MIL/uL   Hemoglobin 11.7 (L) 13.0 - 17.0 g/dL   HCT 38.3 (L) 39.0 - 52.0 %   MCV 90.3 80.0 - 100.0 fL   MCH 27.6 26.0 - 34.0 pg   MCHC 30.5 30.0 - 36.0 g/dL   RDW 12.9 11.5 - 15.5 %   Platelets 30 (L) 150 - 400 K/uL    Comment: SPECIMEN CHECKED FOR CLOTS PLATELET COUNT CONFIRMED BY SMEAR    nRBC 0.0 0.0 - 0.2 %    Comment: Performed at [facility], [place] [address] [place], [place] [address]  Troponin I (High Sensitivity)     Status: None   Collection Time: 08/08/22  2:51 AM  Result Value Ref Range   Troponin I (High Sensitivity) 7 <18 ng/L    Comment: (NOTE) Elevated high sensitivity troponin I (hsTnI) values and significant  changes across serial measurements may suggest ACS but many other  chronic and acute conditions are known to elevate hsTnI results.  Refer to the "Links" section for chest pain algorithms and additional  guidance. Performed at [facility], [place] [address], [place], [place] [address]   Ginger Carne 8, ED     Status: Abnormal   Collection Time: 08/08/22  2:59 AM  Result Value Ref Range   Sodium 135 135 - 145 mmol/L   Potassium 5.3 (H) 3.5 - 5.1 mmol/L   Chloride 104 98 - 111 mmol/L   BUN 31 (H) 6 - 20 mg/dL   Creatinine, Ser 1.50 (H) 0.61 - 1.24 mg/dL   Glucose, Bld 125 (H) 70 - 99 mg/dL    Comment: Glucose reference range applies only to samples taken after fasting for at least 8 hours.   Calcium, Ion 1.09 (L) 1.15 - 1.40 mmol/L   TCO2 23 22 - 32 mmol/L   Hemoglobin 12.9 (L) 13.0 - 17.0 g/dL   HCT 38.0 (L) 39.0 - 52.0 %  Brain natriuretic peptide     Status: None   Collection Time: 08/08/22  3:45 AM  Result Value Ref Range   B Natriuretic Peptide 33.0 0.0 - 100.0 pg/mL    Comment: Performed at [facility], [place] [address], [place], [place] [address]  Comprehensive metabolic panel     Status: Abnormal   Collection Time: 08/08/22  3:45 AM  Result  Value Ref Range   Sodium 136 135 - 145 mmol/L   Potassium 3.9 3.5 - 5.1 mmol/L   Chloride 106 98 - 111 mmol/L   CO2 22 22 - 32 mmol/L   Glucose, Bld 118 (H) 70 - 99  mg/dL    Comment: Glucose reference range applies only to samples taken after fasting for at least 8 hours.   BUN 22 (H) 6 - 20 mg/dL   Creatinine, Ser 1.47 (H) 0.61 - 1.24 mg/dL   Calcium 8.4 (L) 8.9 - 10.3 mg/dL   Total Protein 6.2 (L) 6.5 - 8.1 g/dL   Albumin 3.3 (L) 3.5 - 5.0 g/dL   AST 19 15 - 41 U/L   ALT 14 0 - 44 U/L   Alkaline Phosphatase 46 38 - 126 U/L   Total Bilirubin 0.4 0.3 - 1.2 mg/dL   GFR, Estimated 57 (L) >60 mL/min    Comment: (NOTE) Calculated using the CKD-EPI Creatinine Equation (2021)    Anion gap 8 5 - 15    Comment: Performed at [facility], [place] [address], [place], [place] [address]  Troponin I (High Sensitivity)     Status: None   Collection Time: 08/08/22  5:07 AM  Result Value Ref Range   Troponin I (High Sensitivity) 7 <18 ng/L    Comment: (NOTE) Elevated high sensitivity troponin I (hsTnI) values and significant  changes across serial measurements may suggest ACS but many other  chronic and acute conditions are known to elevate hsTnI results.  Refer to the "Links" section for chest pain algorithms and additional  guidance. Performed at [facility], [place] [address], [place], [place] [address]   Urinalysis, Routine w reflex microscopic -Urine, Clean Catch     Status: Abnormal   Collection Time: 08/08/22  5:40 AM  Result Value Ref Range   Color, Urine COLORLESS (A) YELLOW   APPearance CLEAR CLEAR   Specific Gravity, Urine 1.011 1.005 - 1.030   pH 6.0 5.0 - 8.0   Glucose, UA NEGATIVE NEGATIVE mg/dL   Hgb urine dipstick LARGE (A) NEGATIVE   Bilirubin Urine NEGATIVE NEGATIVE   Ketones, ur NEGATIVE NEGATIVE mg/dL   Protein, ur NEGATIVE NEGATIVE mg/dL   Nitrite NEGATIVE NEGATIVE   Leukocytes,Ua TRACE (A) NEGATIVE   RBC / HPF >50 0 - 5 RBC/hpf    WBC, UA 6-10 0 - 5 WBC/hpf   Bacteria, UA NONE SEEN NONE SEEN   Squamous Epithelial / HPF 0-5 0 - 5 /HPF    Comment: Performed at [facility], [place] [address] [place], [place] [address]  Lactic acid, plasma     Status: None   Collection Time: 08/08/22  6:25 AM  Result Value Ref Range   Lactic Acid, Venous 1.6 0.5 - 1.9 mmol/L    Comment: Performed at [facility], [place] [address], [place], [place] [address]   CT Angio Chest PE W/Cm &/Or Wo Cm  Result Date: 08/08/2022 CLINICAL DATA:  Postop abdominal pain. Pulmonary embolism suspected, high probability. Hemoptysis for 2 hours. EXAM: CT ANGIOGRAPHY CHEST CT ABDOMEN AND PELVIS WITH CONTRAST TECHNIQUE: Multidetector CT imaging of the chest was performed using the standard protocol during bolus administration of intravenous contrast. Multiplanar CT image reconstructions and MIPs were obtained to evaluate the vascular anatomy. Multidetector CT imaging of the abdomen and pelvis was performed using the standard protocol during bolus administration of intravenous contrast. RADIATION DOSE REDUCTION: This exam was performed according to the departmental dose-optimization program which includes automated exposure control, adjustment of the mA and/or kV according to patient size and/or use of iterative reconstruction technique. CONTRAST:  135m OMNIPAQUE IOHEXOL 350 MG/ML SOLN COMPARISON:  05/28/2022. FINDINGS: CTA CHEST FINDINGS Cardiovascular: The heart is normal in size and  there is no pericardial effusion. Scattered coronary artery calcifications are noted. The aorta and pulmonary trunk are normal in caliber. No pulmonary artery filling defect is seen. Mediastinum/Nodes: No enlarged mediastinal, hilar, or axillary lymph nodes. Thyroid gland, trachea, and esophagus demonstrate no significant findings. Lungs/Pleura: Scattered ground-glass opacities are noted in the lungs bilaterally with a central predominance. No effusion or  pneumothorax. Musculoskeletal: Sternotomy wires are noted. No acute osseous abnormality. Review of the MIP images confirms the above findings. CT ABDOMEN and PELVIS FINDINGS Hepatobiliary: No focal liver abnormality is seen. No gallstones, gallbladder wall thickening, or biliary dilatation. Pancreas: Unremarkable. No pancreatic ductal dilatation or surrounding inflammatory changes. Spleen: Normal in size without focal abnormality. Adrenals/Urinary Tract: No adrenal nodule or mass. The kidneys enhance symmetrically. A few punctate renal calculi are noted bilaterally. A left ureteral stent is in place and terminates in the prostate gland. No ureteral calculus. Small focus of air is noted in the urinary bladder which may be iatrogenic. Stomach/Bowel: Stomach is within normal limits. Appendix appears normal. No evidence of bowel wall thickening, distention, or inflammatory changes. No free air or pneumatosis. Vascular/Lymphatic: No significant vascular findings are present. No enlarged abdominal or pelvic lymph nodes. Reproductive: The prostate gland is normal in size. A penile prosthesis is noted with reservoir in the right lower quadrant. Other: No abdominopelvic ascites. Musculoskeletal: Degenerative changes in the lumbar spine. No acute osseous abnormality. Review of the MIP images confirms the above findings. IMPRESSION: 1. No evidence of pulmonary embolism. 2. Diffuse scattered ground-glass opacities in the lungs bilaterally with a central predominance, possible edema versus multifocal pneumonia. 3. No acute process in the abdomen and pelvis. 4. Punctate renal calculi bilaterally with no obstructive uropathy. A left ureteral stent is in place which terminates in the prostate gland. 5. Remaining incidental findings as described above. Electronically Signed   By: Brett Fairy M.D.   On: 08/08/2022 04:36   CT Abdomen Pelvis W Contrast  Result Date: 08/08/2022 CLINICAL DATA:  Postop abdominal pain. Pulmonary  embolism suspected, high probability. Hemoptysis for 2 hours. EXAM: CT ANGIOGRAPHY CHEST CT ABDOMEN AND PELVIS WITH CONTRAST TECHNIQUE: Multidetector CT imaging of the chest was performed using the standard protocol during bolus administration of intravenous contrast. Multiplanar CT image reconstructions and MIPs were obtained to evaluate the vascular anatomy. Multidetector CT imaging of the abdomen and pelvis was performed using the standard protocol during bolus administration of intravenous contrast. RADIATION DOSE REDUCTION: This exam was performed according to the departmental dose-optimization program which includes automated exposure control, adjustment of the mA and/or kV according to patient size and/or use of iterative reconstruction technique. CONTRAST:  143m OMNIPAQUE IOHEXOL 350 MG/ML SOLN COMPARISON:  05/28/2022. FINDINGS: CTA CHEST FINDINGS Cardiovascular: The heart is normal in size and there is no pericardial effusion. Scattered coronary artery calcifications are noted. The aorta and pulmonary trunk are normal in caliber. No pulmonary artery filling defect is seen. Mediastinum/Nodes: No enlarged mediastinal, hilar, or axillary lymph nodes. Thyroid gland, trachea, and esophagus demonstrate no significant findings. Lungs/Pleura: Scattered ground-glass opacities are noted in the lungs bilaterally with a central predominance. No effusion or pneumothorax. Musculoskeletal: Sternotomy wires are noted. No acute osseous abnormality. Review of the MIP images confirms the above findings. CT ABDOMEN and PELVIS FINDINGS Hepatobiliary: No focal liver abnormality is seen. No gallstones, gallbladder wall thickening, or biliary dilatation. Pancreas: Unremarkable. No pancreatic ductal dilatation or surrounding inflammatory changes. Spleen: Normal in size without focal abnormality. Adrenals/Urinary Tract: No adrenal nodule or mass. The kidneys  enhance symmetrically. A few punctate renal calculi are noted bilaterally.  A left ureteral stent is in place and terminates in the prostate gland. No ureteral calculus. Small focus of air is noted in the urinary bladder which may be iatrogenic. Stomach/Bowel: Stomach is within normal limits. Appendix appears normal. No evidence of bowel wall thickening, distention, or inflammatory changes. No free air or pneumatosis. Vascular/Lymphatic: No significant vascular findings are present. No enlarged abdominal or pelvic lymph nodes. Reproductive: The prostate gland is normal in size. A penile prosthesis is noted with reservoir in the right lower quadrant. Other: No abdominopelvic ascites. Musculoskeletal: Degenerative changes in the lumbar spine. No acute osseous abnormality. Review of the MIP images confirms the above findings. IMPRESSION: 1. No evidence of pulmonary embolism. 2. Diffuse scattered ground-glass opacities in the lungs bilaterally with a central predominance, possible edema versus multifocal pneumonia. 3. No acute process in the abdomen and pelvis. 4. Punctate renal calculi bilaterally with no obstructive uropathy. A left ureteral stent is in place which terminates in the prostate gland. 5. Remaining incidental findings as described above. Electronically Signed   By: Brett Fairy M.D.   On: 08/08/2022 04:36   DG Chest Port 1 View  Result Date: 08/08/2022 CLINICAL DATA:  Chest pain EXAM: PORTABLE CHEST 1 VIEW COMPARISON:  02/27/2022 FINDINGS: Cardiac shadow is stable. Postsurgical changes are again seen. Lungs are well aerated bilaterally. Central increased perihilar density is noted consistent with mild pulmonary edema. No sizable effusion is seen. No acute bony abnormality is noted. IMPRESSION: Mild increased perihilar density consistent with pulmonary edema. Electronically Signed   By: Inez Catalina M.D.   On: 08/08/2022 03:06   DG C-Arm 1-60 Min-No Report  Result Date: 08/07/2022 Fluoroscopy was utilized by the requesting physician.  No radiographic interpretation.     Pending Labs Unresulted Labs (From admission, onward)     Start     Ordered   08/09/22 0500  CBC  Daily,   R      08/08/22 0723   08/09/22 [id number]  Basic metabolic panel  Daily,   R      08/08/22 0723   08/08/22 0727  HIV Antibody (routine testing w rflx)  (HIV Antibody (Routine testing w reflex) panel)  Once,   R        08/08/22 0728   08/08/22 0723  Respiratory (~20 pathogens) panel by PCR  (Respiratory panel by PCR (~20 pathogens, ~24 hr TAT)  w precautions)  Once,   URGENT        08/08/22 0722   08/08/22 0722  Protime-INR  Once,   STAT        08/08/22 0722   08/08/22 0722  Procalcitonin  Once,   URGENT       References:    Procalcitonin Lower Respiratory Tract Infection AND Sepsis Procalcitonin Algorithm   08/08/22 0722   08/08/22 0540  DIC Panel Once  Once,   STAT        08/08/22 0540   08/08/22 0540  Culture, blood (routine x 2)  BLOOD CULTURE X 2,   R (with STAT occurrences)      08/08/22 0540   08/08/22 0540  Lactic acid, plasma  Now then every 2 hours,   R (with STAT occurrences)      08/08/22 0540            Vitals/Pain Today's Vitals   08/08/22 0229 08/08/22 0336 08/08/22 0610 08/08/22 0630  BP: (!) 144/86   (!) 129/92  Pulse: (!) 110   (!) 103  Resp: 18   (!) 25  Temp: 98 F (36.7 C)  98.4 F (36.9 C)   TempSrc: Oral  Oral   SpO2: 91%   92%  Weight:      Height:      PainSc:  7       Isolation Precautions Droplet precaution  Medications Medications  sodium chloride (PF) 0.9 % injection (has no administration in time range)  ceFEPIme (MAXIPIME) 2 g in sodium chloride 0.9 % 100 mL IVPB (has no administration in time range)  oxyCODONE (Oxy IR/ROXICODONE) immediate release tablet 5 mg (has no administration in time range)  amLODipine (NORVASC) tablet 10 mg (has no administration in time range)  tamsulosin (FLOMAX) capsule 0.4 mg (has no administration in time range)  insulin aspart (novoLOG) injection 0-15 Units (has no administration in time range)   insulin aspart (novoLOG) injection 0-5 Units (has no administration in time range)  0.9 %  sodium chloride infusion (has no administration in time range)  acetaminophen (TYLENOL) tablet 650 mg (has no administration in time range)    Or  acetaminophen (TYLENOL) suppository 650 mg (has no administration in time range)  docusate sodium (COLACE) capsule 100 mg (has no administration in time range)  polyethylene glycol (MIRALAX / GLYCOLAX) packet 17 g (has no administration in time range)  ondansetron (ZOFRAN) tablet 4 mg (has no administration in time range)    Or  ondansetron (ZOFRAN) injection 4 mg (has no administration in time range)  pantoprazole (PROTONIX) EC tablet 40 mg (has no administration in time range)  fentaNYL (SUBLIMAZE) injection 50 mcg (50 mcg Intravenous Given 08/08/22 0301)  iohexol (OMNIPAQUE) 350 MG/ML injection 100 mL (100 mLs Intravenous Contrast Given 08/08/22 0357)  furosemide (LASIX) injection 20 mg (20 mg Intravenous Given 08/08/22 0459)  piperacillin-tazobactam (ZOSYN) IVPB 3.375 g (0 g Intravenous Stopped 08/08/22 0654)    Mobility walks

## 2022-08-08 NOTE — Progress Notes (Signed)
       Overnight   NAME: Danny Brewer MRN: [id number] DOB : [birth year]    Date of Service   08/08/2022   HPI/Events of Note   Notified by RN for pt request for alternate pain medication due to adverse behavioral reactions to some (current) Narcotic medications.  He is requesting to  not  have oxycodone and hydrocodone.   He states that he has tolerated Toradol and Tramadol before.    Interventions/ Plan   Tramadol ordered for immediate time period. Avoid Toradol due to diagnosis        Gershon Cull BSN MSNA MSN ACNPC-AG Acute Care Nurse Practitioner [place]

## 2022-08-08 NOTE — ED Provider Notes (Signed)
[facility] AT [facility] Provider Note   CSN: [id number] Arrival date & time: 08/08/22  0216     History  Chief Complaint  Patient presents with   Chest Pain   Headache    Danny Brewer is a 52 y.o. male.  The history is provided by the patient.  Chest Pain Associated symptoms: headache   Headache Danny Brewer is a 52 y.o. male who presents to the Emergency Department complaining of chest pain.  He presents to the emergency department for evaluation of chest pain that started around 7 PM this evening.  He describes it as a heavy pressure sensation that is worse with breathing.  Earlier today he had a lithotripsy for a kidney stone and had difficulty waking from anesthesia and was Late due to low oxygen sats.  He did also have some vomiting.  He states that since being discharged home he is unable to lay flat and later this evening began coughing up blood.  He reports 4 episodes of hemoptysis that initially were dark and then turned more bright.  There was 1 episode that was fairly large per description and was greater than a tablespoon.   He has a history of prior coronary artery disease status post CABG, hypertension, diabetes.  No history of DVT/PE.  He also reports increased abdominal pain on the left lower abdomen since the surgery.     Home Medications Prior to Admission medications   Medication Sig Start Date End Date Taking? Authorizing Provider  acetaminophen (TYLENOL) 500 MG tablet Take 2 tablets (1,000 mg total) by mouth every 6 (six) hours. Patient taking differently: Take 1,000 mg by mouth every 6 (six) hours as needed for moderate pain. 07/08/21  Yes Franchot Gallo, MD  aspirin 81 MG tablet Take 1 tablet (81 mg total) by mouth daily. 10/11/18  Yes Weaver, Scott T, PA-C  cephALEXin (KEFLEX) 500 MG capsule Take 1 capsule (500 mg total) by mouth 2 (two) times daily for 3 days. To prevent infection with tethered stent. 08/07/22 08/10/22 Yes Alexis Frock, MD  sitaGLIPtin (JANUVIA) 100 MG tablet Take 100 mg by mouth daily.   Yes [provider]  tamsulosin (FLOMAX) 0.4 MG CAPS capsule Take 0.4 mg by mouth daily.   Yes [provider]  Alirocumab (PRALUENT) 75 MG/ML SOAJ INJECT 1 ML INTO THE SKIN EVERY 14 (FOURTEEN) DAYS. Patient not taking: Reported on 07/02/2022 02/24/21   Sherren Mocha, MD  amLODipine (NORVASC) 10 MG tablet Take 1 tablet (10 mg total) by mouth daily. Patient taking differently: Take 10 mg by mouth daily. 05/15/22   Richardson Dopp T, PA-C  botulinum toxin Type A (BOTOX) 100 units SOLR injection Inject 100 Units into the muscle every 3 (three) months. To be administered by MD. Patient not taking: Reported on 08/05/2022 03/09/22   Sater, Nanine Means, MD  ezetimibe (ZETIA) 10 MG tablet Take 1 tablet (10 mg total) by mouth daily. 03/26/22   Richardson Dopp T, PA-C  ketorolac (TORADOL) 10 MG tablet Take 1 tablet (10 mg total) by mouth every 8 (eight) hours as needed for moderate pain (or stent discomfort post-operatively). 08/07/22   Alexis Frock, MD  oxyCODONE (ROXICODONE) 5 MG immediate release tablet Take 1 tablet (5 mg total) by mouth every 6 (six) hours as needed for severe pain (post-operatively). 08/07/22 08/07/23  Alexis Frock, MD  senna-docusate (SENOKOT-S) 8.6-50 MG tablet Take 1 tablet by mouth 2 (two) times daily. While taking strongest pain meds to prevent  constipation 08/07/22   Alexis Frock, MD      Allergies    Antihistamines, diphenhydramine-type; Bactrim [sulfamethoxazole-trimethoprim]; Demerol; Morphine and related; Compazine [prochlorperazine]; Metoprolol; Oxycodone; and Statins    Review of Systems   Review of Systems  Cardiovascular:  Positive for chest pain.  Neurological:  Positive for headaches.  All other systems reviewed and are negative.  CRITICAL CARE Performed by: Quintella Reichert   Total critical care time: 35 minutes  Critical care time was exclusive of separately billable  procedures and treating other patients.  Critical care was necessary to treat or prevent imminent or life-threatening deterioration.  Critical care was time spent personally by me on the following activities: development of treatment plan with patient and/or surrogate as well as nursing, discussions with consultants, evaluation of patient's response to treatment, examination of patient, obtaining history from patient or surrogate, ordering and performing treatments and interventions, ordering and review of laboratory studies, ordering and review of radiographic studies, pulse oximetry and re-evaluation of patient's condition.  Physical Exam Updated Vital Signs BP (!) 129/92   Pulse (!) 103   Temp 98.4 F (36.9 C) (Oral)   Resp (!) 25   Ht '5\' 9"'$  (1.753 m)   Wt 87.1 kg   SpO2 92%   BMI 28.35 kg/m  Physical Exam Vitals and nursing note reviewed.  Constitutional:      Appearance: He is well-developed.  HENT:     Head: Normocephalic and atraumatic.  Cardiovascular:     Rate and Rhythm: Regular rhythm. Tachycardia present.     Heart sounds: No murmur heard. Pulmonary:     Effort: Pulmonary effort is normal. No respiratory distress.     Breath sounds: Normal breath sounds.  Abdominal:     Palpations: Abdomen is soft.     Tenderness: There is no abdominal tenderness. There is no guarding or rebound.  Musculoskeletal:        General: No swelling or tenderness.  Skin:    General: Skin is warm and dry.  Neurological:     Mental Status: He is alert and oriented to person, place, and time.  Psychiatric:        Behavior: Behavior normal.     ED Results / Procedures / Treatments   Labs (all labs ordered are listed, but only abnormal results are displayed) Labs Reviewed  CBC - Abnormal; Notable for the following components:      Result Value   Hemoglobin 11.7 (*)    HCT 38.3 (*)    Platelets 30 (*)    All other components within normal limits  COMPREHENSIVE METABOLIC PANEL -  Abnormal; Notable for the following components:   Glucose, Bld 118 (*)    BUN 22 (*)    Creatinine, Ser 1.47 (*)    Calcium 8.4 (*)    Total Protein 6.2 (*)    Albumin 3.3 (*)    GFR, Estimated 57 (*)    All other components within normal limits  I-STAT CHEM 8, ED - Abnormal; Notable for the following components:   Potassium 5.3 (*)    BUN 31 (*)    Creatinine, Ser 1.50 (*)    Glucose, Bld 125 (*)    Calcium, Ion 1.09 (*)    Hemoglobin 12.9 (*)    HCT 38.0 (*)    All other components within normal limits  CULTURE, BLOOD (ROUTINE X 2)  CULTURE, BLOOD (ROUTINE X 2)  RESPIRATORY PANEL BY PCR  BRAIN NATRIURETIC PEPTIDE  DIC (DISSEMINATED INTRAVASCULAR COAGULATION)PANEL  LACTIC ACID, PLASMA  LACTIC ACID, PLASMA  URINALYSIS, ROUTINE W REFLEX MICROSCOPIC  PROTIME-INR  PROCALCITONIN  HIV ANTIBODY (ROUTINE TESTING W REFLEX)  TROPONIN I (HIGH SENSITIVITY)  TROPONIN I (HIGH SENSITIVITY)    EKG EKG Interpretation  Date/Time:  Saturday August 08 2022 02:34:01 EST Ventricular Rate:  99 PR Interval:  137 QRS Duration: 144 QT Interval:  360 QTC Calculation: 462 R Axis:   60 Text Interpretation: Sinus rhythm Probable left atrial enlargement Right bundle branch block Confirmed by Quintella Reichert [phone]) on 08/08/2022 2:41:37 AM  Radiology CT Angio Chest PE W/Cm &/Or Wo Cm  Result Date: 08/08/2022 CLINICAL DATA:  Postop abdominal pain. Pulmonary embolism suspected, high probability. Hemoptysis for 2 hours. EXAM: CT ANGIOGRAPHY CHEST CT ABDOMEN AND PELVIS WITH CONTRAST TECHNIQUE: Multidetector CT imaging of the chest was performed using the standard protocol during bolus administration of intravenous contrast. Multiplanar CT image reconstructions and MIPs were obtained to evaluate the vascular anatomy. Multidetector CT imaging of the abdomen and pelvis was performed using the standard protocol during bolus administration of intravenous contrast. RADIATION DOSE REDUCTION: This exam was  performed according to the departmental dose-optimization program which includes automated exposure control, adjustment of the mA and/or kV according to patient size and/or use of iterative reconstruction technique. CONTRAST:  168m OMNIPAQUE IOHEXOL 350 MG/ML SOLN COMPARISON:  05/28/2022. FINDINGS: CTA CHEST FINDINGS Cardiovascular: The heart is normal in size and there is no pericardial effusion. Scattered coronary artery calcifications are noted. The aorta and pulmonary trunk are normal in caliber. No pulmonary artery filling defect is seen. Mediastinum/Nodes: No enlarged mediastinal, hilar, or axillary lymph nodes. Thyroid gland, trachea, and esophagus demonstrate no significant findings. Lungs/Pleura: Scattered ground-glass opacities are noted in the lungs bilaterally with a central predominance. No effusion or pneumothorax. Musculoskeletal: Sternotomy wires are noted. No acute osseous abnormality. Review of the MIP images confirms the above findings. CT ABDOMEN and PELVIS FINDINGS Hepatobiliary: No focal liver abnormality is seen. No gallstones, gallbladder wall thickening, or biliary dilatation. Pancreas: Unremarkable. No pancreatic ductal dilatation or surrounding inflammatory changes. Spleen: Normal in size without focal abnormality. Adrenals/Urinary Tract: No adrenal nodule or mass. The kidneys enhance symmetrically. A few punctate renal calculi are noted bilaterally. A left ureteral stent is in place and terminates in the prostate gland. No ureteral calculus. Small focus of air is noted in the urinary bladder which may be iatrogenic. Stomach/Bowel: Stomach is within normal limits. Appendix appears normal. No evidence of bowel wall thickening, distention, or inflammatory changes. No free air or pneumatosis. Vascular/Lymphatic: No significant vascular findings are present. No enlarged abdominal or pelvic lymph nodes. Reproductive: The prostate gland is normal in size. A penile prosthesis is noted with  reservoir in the right lower quadrant. Other: No abdominopelvic ascites. Musculoskeletal: Degenerative changes in the lumbar spine. No acute osseous abnormality. Review of the MIP images confirms the above findings. IMPRESSION: 1. No evidence of pulmonary embolism. 2. Diffuse scattered ground-glass opacities in the lungs bilaterally with a central predominance, possible edema versus multifocal pneumonia. 3. No acute process in the abdomen and pelvis. 4. Punctate renal calculi bilaterally with no obstructive uropathy. A left ureteral stent is in place which terminates in the prostate gland. 5. Remaining incidental findings as described above. Electronically Signed   By: LBrett FairyM.D.   On: 08/08/2022 04:36   CT Abdomen Pelvis W Contrast  Result Date: 08/08/2022 CLINICAL DATA:  Postop abdominal pain. Pulmonary embolism suspected, high probability. Hemoptysis for 2 hours. EXAM: CT ANGIOGRAPHY CHEST CT  ABDOMEN AND PELVIS WITH CONTRAST TECHNIQUE: Multidetector CT imaging of the chest was performed using the standard protocol during bolus administration of intravenous contrast. Multiplanar CT image reconstructions and MIPs were obtained to evaluate the vascular anatomy. Multidetector CT imaging of the abdomen and pelvis was performed using the standard protocol during bolus administration of intravenous contrast. RADIATION DOSE REDUCTION: This exam was performed according to the departmental dose-optimization program which includes automated exposure control, adjustment of the mA and/or kV according to patient size and/or use of iterative reconstruction technique. CONTRAST:  165m OMNIPAQUE IOHEXOL 350 MG/ML SOLN COMPARISON:  05/28/2022. FINDINGS: CTA CHEST FINDINGS Cardiovascular: The heart is normal in size and there is no pericardial effusion. Scattered coronary artery calcifications are noted. The aorta and pulmonary trunk are normal in caliber. No pulmonary artery filling defect is seen. Mediastinum/Nodes: No  enlarged mediastinal, hilar, or axillary lymph nodes. Thyroid gland, trachea, and esophagus demonstrate no significant findings. Lungs/Pleura: Scattered ground-glass opacities are noted in the lungs bilaterally with a central predominance. No effusion or pneumothorax. Musculoskeletal: Sternotomy wires are noted. No acute osseous abnormality. Review of the MIP images confirms the above findings. CT ABDOMEN and PELVIS FINDINGS Hepatobiliary: No focal liver abnormality is seen. No gallstones, gallbladder wall thickening, or biliary dilatation. Pancreas: Unremarkable. No pancreatic ductal dilatation or surrounding inflammatory changes. Spleen: Normal in size without focal abnormality. Adrenals/Urinary Tract: No adrenal nodule or mass. The kidneys enhance symmetrically. A few punctate renal calculi are noted bilaterally. A left ureteral stent is in place and terminates in the prostate gland. No ureteral calculus. Small focus of air is noted in the urinary bladder which may be iatrogenic. Stomach/Bowel: Stomach is within normal limits. Appendix appears normal. No evidence of bowel wall thickening, distention, or inflammatory changes. No free air or pneumatosis. Vascular/Lymphatic: No significant vascular findings are present. No enlarged abdominal or pelvic lymph nodes. Reproductive: The prostate gland is normal in size. A penile prosthesis is noted with reservoir in the right lower quadrant. Other: No abdominopelvic ascites. Musculoskeletal: Degenerative changes in the lumbar spine. No acute osseous abnormality. Review of the MIP images confirms the above findings. IMPRESSION: 1. No evidence of pulmonary embolism. 2. Diffuse scattered ground-glass opacities in the lungs bilaterally with a central predominance, possible edema versus multifocal pneumonia. 3. No acute process in the abdomen and pelvis. 4. Punctate renal calculi bilaterally with no obstructive uropathy. A left ureteral stent is in place which terminates in  the prostate gland. 5. Remaining incidental findings as described above. Electronically Signed   By: LBrett FairyM.D.   On: 08/08/2022 04:36   DG Chest Port 1 View  Result Date: 08/08/2022 CLINICAL DATA:  Chest pain EXAM: PORTABLE CHEST 1 VIEW COMPARISON:  02/27/2022 FINDINGS: Cardiac shadow is stable. Postsurgical changes are again seen. Lungs are well aerated bilaterally. Central increased perihilar density is noted consistent with mild pulmonary edema. No sizable effusion is seen. No acute bony abnormality is noted. IMPRESSION: Mild increased perihilar density consistent with pulmonary edema. Electronically Signed   By: MInez CatalinaM.D.   On: 08/08/2022 03:06   DG C-Arm 1-60 Min-No Report  Result Date: 08/07/2022 Fluoroscopy was utilized by the requesting physician.  No radiographic interpretation.    Procedures Procedures   CRITICAL CARE Performed by: EQuintella Reichert  Total critical care time: 40 minutes  Critical care time was exclusive of separately billable procedures and treating other patients.  Critical care was necessary to treat or prevent imminent or life-threatening deterioration.  Critical care was  time spent personally by me on the following activities: development of treatment plan with patient and/or surrogate as well as nursing, discussions with consultants, evaluation of patient's response to treatment, examination of patient, obtaining history from patient or surrogate, ordering and performing treatments and interventions, ordering and review of laboratory studies, ordering and review of radiographic studies, pulse oximetry and re-evaluation of patient's condition.  Medications Ordered in ED Medications  sodium chloride (PF) 0.9 % injection (has no administration in time range)  ceFEPIme (MAXIPIME) 2 g in sodium chloride 0.9 % 100 mL IVPB (has no administration in time range)  oxyCODONE (Oxy IR/ROXICODONE) immediate release tablet 5 mg (has no administration in time  range)  amLODipine (NORVASC) tablet 10 mg (has no administration in time range)  tamsulosin (FLOMAX) capsule 0.4 mg (has no administration in time range)  insulin aspart (novoLOG) injection 0-15 Units (has no administration in time range)  insulin aspart (novoLOG) injection 0-5 Units (has no administration in time range)  0.9 %  sodium chloride infusion (has no administration in time range)  acetaminophen (TYLENOL) tablet 650 mg (has no administration in time range)    Or  acetaminophen (TYLENOL) suppository 650 mg (has no administration in time range)  docusate sodium (COLACE) capsule 100 mg (has no administration in time range)  polyethylene glycol (MIRALAX / GLYCOLAX) packet 17 g (has no administration in time range)  ondansetron (ZOFRAN) tablet 4 mg (has no administration in time range)    Or  ondansetron (ZOFRAN) injection 4 mg (has no administration in time range)  fentaNYL (SUBLIMAZE) injection 50 mcg (50 mcg Intravenous Given 08/08/22 0301)  iohexol (OMNIPAQUE) 350 MG/ML injection 100 mL (100 mLs Intravenous Contrast Given 08/08/22 0357)  furosemide (LASIX) injection 20 mg (20 mg Intravenous Given 08/08/22 0459)  piperacillin-tazobactam (ZOSYN) IVPB 3.375 g (3.375 g Intravenous New Bag/Given 08/08/22 [id number])    ED Course/ Medical Decision Making/ A&P                             Medical Decision Making Amount and/or Complexity of Data Reviewed Labs: ordered. Radiology: ordered.  Risk Prescription drug management. Decision regarding hospitalization.   Patient here for evaluation of shortness of breath, hemoptysis after recent procedure with general anesthesia for lithotripsy.  Patient is mildly tachycardic with new oxygen requirement on evaluation.  Labs significant for new and significant thrombocytopenia, mild decrease in his hemoglobin.  Chest x-ray is concerning for pulmonary edema-images personally reviewed and interpreted, agree with radiologist interpretation.  Given his  hemoptysis a CTA was obtained.  CTA is negative for PE but is concerning for pulmonary edema versus infection.  Given the new thrombocytopenia, cannot rule out early sepsis.  Will send cultures and start on empiric antibiotics pending further evaluation.  Patient also treated with furosemide for diuresis for concern for pulmonary edema.  Patient and wife updated of findings of studies and recommendation for admission and they are in agreement with treatment plan.  Hospitalist consulted for admission.        Final Clinical Impression(s) / ED Diagnoses Final diagnoses:  Acute pulmonary edema ([place])  Hemoptysis    Rx / DC Orders ED Discharge Orders     None         Quintella Reichert, MD 08/08/22 [phone]

## 2022-08-09 DIAGNOSIS — R042 Hemoptysis: Secondary | ICD-10-CM | POA: Diagnosis not present

## 2022-08-09 LAB — RESP PANEL BY RT-PCR (RSV, FLU A&B, COVID)  RVPGX2
Influenza A by PCR: NEGATIVE
Influenza B by PCR: NEGATIVE
Resp Syncytial Virus by PCR: NEGATIVE
SARS Coronavirus 2 by RT PCR: NEGATIVE

## 2022-08-09 LAB — CBC
HCT: 38.8 % — ABNORMAL LOW (ref 39.0–52.0)
Hemoglobin: 12.3 g/dL — ABNORMAL LOW (ref 13.0–17.0)
MCH: 28.1 pg (ref 26.0–34.0)
MCHC: 31.7 g/dL (ref 30.0–36.0)
MCV: 88.8 fL (ref 80.0–100.0)
Platelets: 196 10*3/uL (ref 150–400)
RBC: 4.37 MIL/uL (ref 4.22–5.81)
RDW: 13 % (ref 11.5–15.5)
WBC: 10.5 10*3/uL (ref 4.0–10.5)
nRBC: 0 % (ref 0.0–0.2)

## 2022-08-09 LAB — BASIC METABOLIC PANEL
Anion gap: 5 (ref 5–15)
BUN: 21 mg/dL — ABNORMAL HIGH (ref 6–20)
CO2: 22 mmol/L (ref 22–32)
Calcium: 8.2 mg/dL — ABNORMAL LOW (ref 8.9–10.3)
Chloride: 111 mmol/L (ref 98–111)
Creatinine, Ser: 1.33 mg/dL — ABNORMAL HIGH (ref 0.61–1.24)
GFR, Estimated: >60 mL/min (ref >60)
Glucose, Bld: 106 mg/dL — ABNORMAL HIGH (ref 70–99)
Potassium: 4 mmol/L (ref 3.5–5.1)
Sodium: 138 mmol/L (ref 135–145)

## 2022-08-09 LAB — GLUCOSE, CAPILLARY
Glucose-Capillary: 96 mg/dL (ref 70–99)
Glucose-Capillary: 94 mg/dL (ref 70–99)
Glucose-Capillary: 114 mg/dL — ABNORMAL HIGH (ref 70–99)
Glucose-Capillary: 151 mg/dL — ABNORMAL HIGH (ref 70–99)

## 2022-08-09 LAB — STREP PNEUMONIAE URINARY ANTIGEN: Strep Pneumo Urinary Antigen: NEGATIVE

## 2022-08-09 MED ORDER — FUROSEMIDE 10 MG/ML IJ SOLN
40.0000 mg | Freq: Once | INTRAMUSCULAR | Status: AC
  Administered 2022-08-09: 40 mg via INTRAVENOUS
  Filled 2022-08-09: qty 4

## 2022-08-09 MED ORDER — TRAMADOL HCL 50 MG PO TABS
50.0000 mg | ORAL_TABLET | Freq: Three times a day (TID) | ORAL | Status: DC | PRN
  Administered 2022-08-09: 50 mg via ORAL
  Filled 2022-08-09: qty 1

## 2022-08-09 MED ORDER — TRAMADOL HCL 50 MG PO TABS
50.0000 mg | ORAL_TABLET | Freq: Four times a day (QID) | ORAL | Status: DC | PRN
  Administered 2022-08-09 – 2022-08-13 (×5): 50 mg via ORAL
  Filled 2022-08-09 (×5): qty 1

## 2022-08-09 NOTE — Plan of Care (Signed)
  Problem: Education: Goal: Knowledge of General Education information will improve Description: Including pain rating scale, medication(s)/side effects and non-pharmacologic comfort measures Outcome: Progressing   Problem: Activity: Goal: Risk for activity intolerance will decrease Outcome: Progressing   Problem: Safety: Goal: Ability to remain free from injury will improve Outcome: Progressing   Problem: Pain Managment: Goal: General experience of comfort will improve Outcome: Progressing   Problem: Coping: Goal: Level of anxiety will decrease Outcome: Progressing

## 2022-08-09 NOTE — Progress Notes (Signed)
PROGRESS NOTE  Danny Brewer [id number] DOB: May 29, 1971 DOA: 08/08/2022 PCP: Lurline Del, DO  HPI/Recap of past 24 hours: Danny Brewer is a 52 y.o. male with medical history significant for BPH, CAD s/p CABG in 2020, GERD, history of kidney stones, HTN, DM2 presented to the ED with complaints of hemoptysis, chest discomfort after cystoscopy and stent placement for obstructing renal stone on 3/1. Cystoscopy procedure went well, however afterwards he was having little bit of trouble breathing, he remembers being given multiple breathing treatments, and stayed in PACU for several hours until he was stabilized.  He recalls having an episode of hemoptysis while in the postprocedure area, eventually was discharged home on room air. Shortly after d/c noted hemoptysis, with pleuritic chest pain. In the ER, he was saturating well on room air, lab work was relatively unremarkable, had 2 negative troponins,. Patient admitted for further management.    Today, patient still reporting some chest discomfort, denies further hemoptysis, fever/chills, nausea/vomiting, shortness of breath, abdominal pain, diarrhea.    Assessment/Plan: Principal Problem:   Hemoptysis Active Problems:   GERD (gastroesophageal reflux disease)   Coronary artery disease   Essential hypertension   Hyperlipidemia   Type 2 diabetes mellitus without complication, without long-term current use of insulin (HCC)   S/P cystoscopy with ureteral stent placement   Multifocal pneumonia versus aspiration pneumonia ??Pulm edema ??Patient was placed under general anesthesia by LMA insertion during procedure Currently afebrile, with no leukocytosis, currently on room air Respiratory panel negative Procalcitonin 0.25, will repeat CTA chest negative for PE, noted diffuse scattered groundglass opacities in the lungs bilaterally with a central predominance possible edema versus multifocal pneumonia Strep urine antigen, Legionella urine  antigen pending Continue cefepime for now Give 1 dose of IV Lasix Monitor closely  CAD s/p CABG in 2020 Hyperlipidemia Still reporting some related musculoskeletal chest pain, has a follow-up with cardiology to further evaluate Otherwise denies any left-sided chest pain Troponins negative, EKG with no acute ST changes  Hypertension Continue amlodipine  Diabetes mellitus type 2 Last A1c 6.2 SSI, Accu-Cheks, hypoglycemic protocol  GERD Continue PPI  S/p cystoscopy with ureteral stent placement Continue Flomax       Estimated body mass index is 28.35 kg/m as calculated from the following:   Height as of this encounter: '5\' 9"'$  (1.753 m).   Weight as of this encounter: 87.1 kg.     Code Status: Full  Family Communication: None at bedside  Disposition Plan: Status is: Inpatient Remains inpatient appropriate because: Level of care      Consultants: None  Procedures: None  Antimicrobials: Cefepime  DVT prophylaxis: SCDs   Objective: Vitals:   08/08/22 2107 08/09/22 0100 08/09/22 0528 08/09/22 0903  BP: 121/77 (!) 144/99 (!) 141/95 (!) 145/84  Pulse: (!) 105 (!) 106 93 (!) 107  Resp: '20 20 16 16  '$ Temp: 98.9 F (37.2 C) 98.9 F (37.2 C) 98.8 F (37.1 C)   TempSrc: Oral Oral Oral   SpO2: 94% 92% 96% 97%  Weight:      Height:        Intake/Output Summary (Last 24 hours) at 08/09/2022 1121 Last data filed at 08/09/2022 0753 Gross per 24 hour  Intake 1418 ml  Output 600 ml  Net 818 ml   Filed Weights   08/08/22 0227  Weight: 87.1 kg    Exam: General: NAD  Cardiovascular: S1, S2 present Respiratory: Diminished breath sounds bilaterally Abdomen: Soft, nontender, nondistended, bowel sounds present Musculoskeletal: No bilateral pedal edema  noted Skin: Normal Psychiatry: Normal mood     Data Reviewed: CBC: Recent Labs  Lab 08/08/22 0251 08/08/22 0259 08/08/22 0758 08/09/22 0507  WBC 8.7  --   --  10.5  HGB 11.7* 12.9*  --  12.3*  HCT  38.3* 38.0*  --  38.8*  MCV 90.3  --   --  88.8  PLT 30*  --  200 [id number]   Basic Metabolic Panel: Recent Labs  Lab 08/08/22 0259 08/08/22 0345 08/09/22 0507  NA 135 136 138  K 5.3* 3.9 4.0  CL 104 106 111  CO2  --  22 22  GLUCOSE 125* 118* 106*  BUN 31* 22* 21*  CREATININE 1.50* 1.47* 1.33*  CALCIUM  --  8.4* 8.2*   GFR: Estimated Creatinine Clearance: 70.2 mL/min (A) (by C-G formula based on SCr of 1.33 mg/dL (H)). Liver Function Tests: Recent Labs  Lab 08/08/22 0345  AST 19  ALT 14  ALKPHOS 46  BILITOT 0.4  PROT 6.2*  ALBUMIN 3.3*   No results for input(s): "LIPASE", "AMYLASE" in the last 168 hours. No results for input(s): "AMMONIA" in the last 168 hours. Coagulation Profile: Recent Labs  Lab 08/08/22 0758  INR 1.1   Cardiac Enzymes: No results for input(s): "CKTOTAL", "CKMB", "CKMBINDEX", "TROPONINI" in the last 168 hours. BNP (last 3 results) No results for input(s): "PROBNP" in the last 8760 hours. HbA1C: No results for input(s): "HGBA1C" in the last 72 hours. CBG: Recent Labs  Lab 08/08/22 0814 08/08/22 1155 08/08/22 1650 08/08/22 2104 08/09/22 0721  GLUCAP 95 86 85 108* 96   Lipid Profile: No results for input(s): "CHOL", "HDL", "LDLCALC", "TRIG", "CHOLHDL", "LDLDIRECT" in the last 72 hours. Thyroid Function Tests: No results for input(s): "TSH", "T4TOTAL", "FREET4", "T3FREE", "THYROIDAB" in the last 72 hours. Anemia Panel: No results for input(s): "VITAMINB12", "FOLATE", "FERRITIN", "TIBC", "IRON", "RETICCTPCT" in the last 72 hours. Urine analysis:    Component Value Date/Time   COLORURINE COLORLESS (A) 08/08/2022 0540   APPEARANCEUR CLEAR 08/08/2022 0540   LABSPEC 1.011 08/08/2022 0540   PHURINE 6.0 08/08/2022 0540   GLUCOSEU NEGATIVE 08/08/2022 0540   HGBUR LARGE (A) 08/08/2022 0540   BILIRUBINUR NEGATIVE 08/08/2022 0540   KETONESUR NEGATIVE 08/08/2022 0540   PROTEINUR NEGATIVE 08/08/2022 0540   UROBILINOGEN 0.2 11/26/2014 1518    NITRITE NEGATIVE 08/08/2022 0540   LEUKOCYTESUR TRACE (A) 08/08/2022 0540   Sepsis Labs: '@LABRCNTIP'$ (procalcitonin:4,lacticidven:4)  ) Recent Results (from the past 240 hour(s))  Culture, blood (routine x 2)     Status: None (Preliminary result)   Collection Time: 08/08/22  6:25 AM   Specimen: BLOOD  Result Value Ref Range Status   Specimen Description   Final    BLOOD RIGHT ANTECUBITAL Performed at [facility], [place] [address] [place], [place] [address]    Special Requests   Final    BOTTLES DRAWN AEROBIC AND ANAEROBIC Blood Culture adequate volume Performed at [facility] [address], [place], [place] [address]    Culture   Final    NO GROWTH < 24 HOURS Performed at [facility] [address] [place], [place] [address]    Report Status PENDING  Incomplete  Respiratory (~20 pathogens) panel by PCR     Status: None   Collection Time: 08/08/22  7:59 AM   Specimen: Nasopharyngeal Swab; Respiratory  Result Value Ref Range Status   Adenovirus NOT DETECTED NOT DETECTED Final   Coronavirus 229E NOT DETECTED NOT DETECTED  Final    Comment: (NOTE) The Coronavirus on the Respiratory Panel, DOES NOT test for the novel  Coronavirus (2019 nCoV)    Coronavirus HKU1 NOT DETECTED NOT DETECTED Final   Coronavirus NL63 NOT DETECTED NOT DETECTED Final   Coronavirus OC43 NOT DETECTED NOT DETECTED Final   Metapneumovirus NOT DETECTED NOT DETECTED Final   Rhinovirus / Enterovirus NOT DETECTED NOT DETECTED Final   Influenza A NOT DETECTED NOT DETECTED Final   Influenza B NOT DETECTED NOT DETECTED Final   Parainfluenza Virus 1 NOT DETECTED NOT DETECTED Final   Parainfluenza Virus 2 NOT DETECTED NOT DETECTED Final   Parainfluenza Virus 3 NOT DETECTED NOT DETECTED Final   Parainfluenza Virus 4 NOT DETECTED NOT DETECTED Final   Respiratory Syncytial Virus NOT DETECTED NOT DETECTED Final   Bordetella pertussis NOT DETECTED NOT DETECTED Final    Bordetella Parapertussis NOT DETECTED NOT DETECTED Final   Chlamydophila pneumoniae NOT DETECTED NOT DETECTED Final   Mycoplasma pneumoniae NOT DETECTED NOT DETECTED Final    Comment: Performed at [facility], [place] [address] [place], [place] [address]  Culture, blood (routine x 2)     Status: None (Preliminary result)   Collection Time: 08/08/22  9:13 AM   Specimen: BLOOD  Result Value Ref Range Status   Specimen Description   Final    BLOOD BLOOD LEFT HAND AEROBIC BOTTLE ONLY Performed at [facility] [address], [place], [place] [address]    Special Requests   Final    BOTTLES DRAWN AEROBIC ONLY Blood Culture adequate volume Performed at [facility] [address] [place], [place] [address]    Culture   Final    NO GROWTH < 24 HOURS Performed at [facility] [address] [place], [place] [address]    Report Status PENDING  Incomplete  Resp panel by RT-PCR (RSV, Flu A&B, Covid) Anterior Nasal Swab     Status: None   Collection Time: 08/09/22  7:07 AM   Specimen: Anterior Nasal Swab  Result Value Ref Range Status   SARS Coronavirus 2 by RT PCR NEGATIVE NEGATIVE Final    Comment: (NOTE) SARS-CoV-2 target nucleic acids are NOT DETECTED.  The SARS-CoV-2 RNA is generally detectable in upper respiratory specimens during the acute phase of infection. The lowest concentration of SARS-CoV-2 viral copies this assay can detect is 138 copies/mL. A negative result does not preclude SARS-Cov-2 infection and should not be used as the sole basis for treatment or other patient management decisions. A negative result may occur with  improper specimen collection/handling, submission of specimen other than nasopharyngeal swab, presence of viral mutation(s) within the areas targeted by this assay, and inadequate number of viral copies(<138 copies/mL). A negative result must be combined with clinical observations, patient history, and  epidemiological information. The expected result is Negative.  Fact Sheet for Patients:  EntrepreneurPulse.com.au  Fact Sheet for Healthcare Providers:  IncredibleEmployment.be  This test is no t yet approved or cleared by the [place] FDA and  has been authorized for detection and/or diagnosis of SARS-CoV-2 by FDA under an Emergency Use Authorization (EUA). This EUA will remain  in effect (meaning this test can be used) for the duration of the COVID-19 declaration under Section 564(b)(1) of the Act, 21 U.S.C.section 360bbb-3(b)(1), unless the authorization is terminated  or revoked sooner.       Influenza A by PCR NEGATIVE NEGATIVE Final   Influenza B by PCR NEGATIVE NEGATIVE Final  Comment: (NOTE) The Xpert Xpress SARS-CoV-2/FLU/RSV plus assay is intended as an aid in the diagnosis of influenza from Nasopharyngeal swab specimens and should not be used as a sole basis for treatment. Nasal washings and aspirates are unacceptable for Xpert Xpress SARS-CoV-2/FLU/RSV testing.  Fact Sheet for Patients: EntrepreneurPulse.com.au  Fact Sheet for Healthcare Providers: IncredibleEmployment.be  This test is not yet approved or cleared by the [place] FDA and has been authorized for detection and/or diagnosis of SARS-CoV-2 by FDA under an Emergency Use Authorization (EUA). This EUA will remain in effect (meaning this test can be used) for the duration of the COVID-19 declaration under Section 564(b)(1) of the Act, 21 U.S.C. section 360bbb-3(b)(1), unless the authorization is terminated or revoked.     Resp Syncytial Virus by PCR NEGATIVE NEGATIVE Final    Comment: (NOTE) Fact Sheet for Patients: EntrepreneurPulse.com.au  Fact Sheet for Healthcare Providers: IncredibleEmployment.be  This test is not yet approved or cleared by the [place] FDA and has been  authorized for detection and/or diagnosis of SARS-CoV-2 by FDA under an Emergency Use Authorization (EUA). This EUA will remain in effect (meaning this test can be used) for the duration of the COVID-19 declaration under Section 564(b)(1) of the Act, 21 U.S.C. section 360bbb-3(b)(1), unless the authorization is terminated or revoked.  Performed at [facility], [place] [address] [place],  [address]       Studies: No results found.  Scheduled Meds:  amLODipine  10 mg Oral Daily   docusate sodium  100 mg Oral BID   insulin aspart  0-15 Units Subcutaneous TID WC   insulin aspart  0-5 Units Subcutaneous QHS   pantoprazole  40 mg Oral Daily   tamsulosin  0.4 mg Oral Daily    Continuous Infusions:  ceFEPime (MAXIPIME) IV 2 g (08/09/22 0515)     LOS: 1 day     Alma Friendly, MD [facility]  If 7PM-7AM, please contact night-coverage [email or web address] 08/09/2022, 11:21 AM

## 2022-08-09 NOTE — Progress Notes (Cosign Needed)
  Transition of Care [facility]) Screening Note   Patient Details  Name: Danny Brewer Date of Birth: 1971/05/02   [facility]) CM/SW Contact:    Henrietta Dine, RN Phone Number: 08/09/2022, 3:15 PM    Transition of Care Department [facility]) has reviewed patient and no TOC needs have been identified at this time. We will continue to monitor patient advancement through interdisciplinary progression rounds. If new patient transition needs arise, please place a TOC consult.

## 2022-08-10 ENCOUNTER — Other Ambulatory Visit (HOSPITAL_COMMUNITY): Payer: Self-pay

## 2022-08-10 ENCOUNTER — Telehealth: Payer: Self-pay | Admitting: Cardiovascular Disease

## 2022-08-10 DIAGNOSIS — R042 Hemoptysis: Secondary | ICD-10-CM | POA: Diagnosis not present

## 2022-08-10 LAB — BASIC METABOLIC PANEL
Anion gap: 10 (ref 5–15)
BUN: 16 mg/dL (ref 6–20)
CO2: 24 mmol/L (ref 22–32)
Calcium: 8.5 mg/dL — ABNORMAL LOW (ref 8.9–10.3)
Chloride: 102 mmol/L (ref 98–111)
Creatinine, Ser: 1.22 mg/dL (ref 0.61–1.24)
GFR, Estimated: >60 mL/min (ref >60)
Glucose, Bld: 107 mg/dL — ABNORMAL HIGH (ref 70–99)
Potassium: 3.9 mmol/L (ref 3.5–5.1)
Sodium: 136 mmol/L (ref 135–145)

## 2022-08-10 LAB — CBC
HCT: 38.3 % — ABNORMAL LOW (ref 39.0–52.0)
HCT: 42.9 % (ref 39.0–52.0)
Hemoglobin: 11.7 g/dL — ABNORMAL LOW (ref 13.0–17.0)
Hemoglobin: 13.5 g/dL (ref 13.0–17.0)
MCH: 27.6 pg (ref 26.0–34.0)
MCH: 27.8 pg (ref 26.0–34.0)
MCHC: 30.5 g/dL (ref 30.0–36.0)
MCHC: 31.5 g/dL (ref 30.0–36.0)
MCV: 90.3 fL (ref 80.0–100.0)
MCV: 88.3 fL (ref 80.0–100.0)
Platelets: 30 10*3/uL — ABNORMAL LOW (ref 150–400)
Platelets: 195 10*3/uL (ref 150–400)
RBC: 4.24 MIL/uL (ref 4.22–5.81)
RBC: 4.86 MIL/uL (ref 4.22–5.81)
RDW: 12.9 % (ref 11.5–15.5)
RDW: 12.9 % (ref 11.5–15.5)
WBC: 8.7 10*3/uL (ref 4.0–10.5)
WBC: 10.7 10*3/uL — ABNORMAL HIGH (ref 4.0–10.5)
nRBC: 0 % (ref 0.0–0.2)
nRBC: 0 % (ref 0.0–0.2)

## 2022-08-10 LAB — GLUCOSE, CAPILLARY
Glucose-Capillary: 103 mg/dL — ABNORMAL HIGH (ref 70–99)
Glucose-Capillary: 196 mg/dL — ABNORMAL HIGH (ref 70–99)
Glucose-Capillary: 113 mg/dL — ABNORMAL HIGH (ref 70–99)
Glucose-Capillary: 126 mg/dL — ABNORMAL HIGH (ref 70–99)

## 2022-08-10 LAB — PROCALCITONIN: Procalcitonin: 0.12 ng/mL

## 2022-08-10 MED ORDER — FUROSEMIDE 10 MG/ML IJ SOLN
40.0000 mg | Freq: Every day | INTRAMUSCULAR | Status: DC
  Administered 2022-08-10: 40 mg via INTRAVENOUS
  Filled 2022-08-10: qty 4

## 2022-08-10 NOTE — Telephone Encounter (Signed)
Pt's wife was told by hospital, where patient is currently located, to let his cardiologist know that he is there after having issues after kidney stone removal. She states he know seems to be having chest pain and she wants to make sure all is okay.   He is at [facility], [place].

## 2022-08-10 NOTE — Plan of Care (Signed)
  Problem: Safety: Goal: Ability to remain free from injury will improve Outcome: Progressing   Problem: Pain Managment: Goal: General experience of comfort will improve Outcome: Progressing    Problem: Coping: Goal: Level of anxiety will decrease Outcome: Progressing   Problem: Clinical Measurements: Goal: Ability to maintain clinical measurements within normal limits will improve Outcome: Progressing

## 2022-08-10 NOTE — Telephone Encounter (Signed)
   Pharmacy Patient Advocate Encounter- Botox BIV-Pharmacy Benefit:  PA was submitted to [employer] and has been approved through: 08/07/2023 Authorization# PA Case ID #: [id number]  Please send prescription to Specialty Pharmacy: [facility]: [phone]  Estimated Copay is: See above test billing-Insurance believe that PT has another insurance and therefor PT will need to call their (Education officer, museum) to get this fixed in order for their medicaid to pay and cover the Botox. Even if PT states they do not have another coverage they will need to talk to their Adult nurse) in order to get this resolved.  Patient Is NOT eligible for Botox Copay Card, which will make patient's copay as little as zero. Copay card will be provided to pharmacy.

## 2022-08-10 NOTE — Progress Notes (Signed)
PROGRESS NOTE  Danny Brewer [id number] DOB: 1970/12/31 DOA: 08/08/2022 PCP: Lurline Del, DO  HPI/Recap of past 24 hours: Danny Brewer is a 52 y.o. male with medical history significant for BPH, CAD s/p CABG in 2020, GERD, history of kidney stones, HTN, DM2 presented to the ED with complaints of hemoptysis, chest discomfort after cystoscopy and stent placement for obstructing renal stone on 3/1. Cystoscopy procedure went well, however afterwards he was having little bit of trouble breathing, he remembers being given multiple breathing treatments, and stayed in PACU for several hours until he was stabilized.  He recalls having an episode of hemoptysis while in the postprocedure area, eventually was discharged home on room air. Shortly after d/c noted hemoptysis, with pleuritic chest pain. In the ER, he was saturating well on room air, lab work was relatively unremarkable, had 2 negative troponins,. Patient admitted for further management.     Today, patient denies any new complaints, still reports some chest discomfort. Urology asked RN to remove stent this am.    Assessment/Plan: Principal Problem:   Hemoptysis Active Problems:   GERD (gastroesophageal reflux disease)   Coronary artery disease   Essential hypertension   Hyperlipidemia   Type 2 diabetes mellitus without complication, without long-term current use of insulin (HCC)   S/P cystoscopy with ureteral stent placement   Multifocal pneumonia versus aspiration pneumonia ??Pulm edema ??Patient was placed under general anesthesia by LMA insertion during procedure Currently afebrile, with no leukocytosis, currently on room air Respiratory panel negative Procalcitonin 0.25-->0.12 CTA chest negative for PE, noted diffuse scattered groundglass opacities in the lungs bilaterally with a central predominance possible edema versus multifocal pneumonia Strep urine antigen negative, Legionella urine antigen pending Continue  cefepime for now Continue Lasix Monitor closely  CAD s/p CABG in 2020 Hyperlipidemia Still reporting some related musculoskeletal chest pain, has a follow-up with cardiology to further evaluate Otherwise denies any left-sided chest pain Troponins negative, EKG with no acute ST changes  Hypertension Continue amlodipine  Diabetes mellitus type 2 Last A1c 6.2 SSI, Accu-Cheks, hypoglycemic protocol  GERD Continue PPI  S/p cystoscopy with ureteral stent placement Continue Flomax       Estimated body mass index is 28.35 kg/m as calculated from the following:   Height as of this encounter: '5\' 9"'$  (1.753 m).   Weight as of this encounter: 87.1 kg.     Code Status: Full  Family Communication: None at bedside  Disposition Plan: Status is: Inpatient Remains inpatient appropriate because: Level of care      Consultants: None  Procedures: None  Antimicrobials: Cefepime  DVT prophylaxis: SCDs   Objective: Vitals:   08/09/22 1829 08/09/22 2033 08/10/22 0504 08/10/22 1435  BP:  122/80 (!) 123/90 117/84  Pulse:  (!) 103 (!) 103 (!) 106  Resp:  '20 19 20  '$ Temp: 99.2 F (37.3 C) 98.3 F (36.8 C) 98.2 F (36.8 C) 97.8 F (36.6 C)  TempSrc:  Oral Oral Oral  SpO2:  97% 96% 98%  Weight:      Height:        Intake/Output Summary (Last 24 hours) at 08/10/2022 1506 Last data filed at 08/10/2022 1329 Gross per 24 hour  Intake 1520 ml  Output 2000 ml  Net -480 ml   Filed Weights   08/08/22 0227  Weight: 87.1 kg    Exam: General: NAD  Cardiovascular: S1, S2 present Respiratory: Diminished breath sounds bilaterally Abdomen: Soft, nontender, nondistended, bowel sounds present Musculoskeletal: No bilateral pedal edema noted Skin:  Normal Psychiatry: Normal mood     Data Reviewed: CBC: Recent Labs  Lab 08/08/22 0251 08/08/22 0259 08/08/22 0758 08/09/22 0507 08/10/22 0445  WBC 8.7  --   --  10.5 10.7*  HGB 11.7* 12.9*  --  12.3* 13.5  HCT 38.3* 38.0*   --  38.8* 42.9  MCV 90.3  --   --  88.8 88.3  PLT 30*  --  200 196 0000000   Basic Metabolic Panel: Recent Labs  Lab 08/08/22 0259 08/08/22 0345 08/09/22 0507 08/10/22 0445  NA 135 136 138 136  K 5.3* 3.9 4.0 3.9  CL 104 106 111 102  CO2  --  '22 22 24  '$ GLUCOSE 125* 118* 106* 107*  BUN 31* 22* 21* 16  CREATININE 1.50* 1.47* 1.33* 1.22  CALCIUM  --  8.4* 8.2* 8.5*   GFR: Estimated Creatinine Clearance: 76.6 mL/min (by C-G formula based on SCr of 1.22 mg/dL). Liver Function Tests: Recent Labs  Lab 08/08/22 0345  AST 19  ALT 14  ALKPHOS 46  BILITOT 0.4  PROT 6.2*  ALBUMIN 3.3*   No results for input(s): "LIPASE", "AMYLASE" in the last 168 hours. No results for input(s): "AMMONIA" in the last 168 hours. Coagulation Profile: Recent Labs  Lab 08/08/22 0758  INR 1.1   Cardiac Enzymes: No results for input(s): "CKTOTAL", "CKMB", "CKMBINDEX", "TROPONINI" in the last 168 hours. BNP (last 3 results) No results for input(s): "PROBNP" in the last 8760 hours. HbA1C: No results for input(s): "HGBA1C" in the last 72 hours. CBG: Recent Labs  Lab 08/09/22 1124 08/09/22 1623 08/09/22 2031 08/10/22 0728 08/10/22 1148  GLUCAP 94 114* 151* 103* 196*   Lipid Profile: No results for input(s): "CHOL", "HDL", "LDLCALC", "TRIG", "CHOLHDL", "LDLDIRECT" in the last 72 hours. Thyroid Function Tests: No results for input(s): "TSH", "T4TOTAL", "FREET4", "T3FREE", "THYROIDAB" in the last 72 hours. Anemia Panel: No results for input(s): "VITAMINB12", "FOLATE", "FERRITIN", "TIBC", "IRON", "RETICCTPCT" in the last 72 hours. Urine analysis:    Component Value Date/Time   COLORURINE COLORLESS (A) 08/08/2022 0540   APPEARANCEUR CLEAR 08/08/2022 0540   LABSPEC 1.011 08/08/2022 0540   PHURINE 6.0 08/08/2022 0540   GLUCOSEU NEGATIVE 08/08/2022 0540   HGBUR LARGE (A) 08/08/2022 0540   BILIRUBINUR NEGATIVE 08/08/2022 0540   KETONESUR NEGATIVE 08/08/2022 0540   PROTEINUR NEGATIVE 08/08/2022  0540   UROBILINOGEN 0.2 11/26/2014 1518   NITRITE NEGATIVE 08/08/2022 0540   LEUKOCYTESUR TRACE (A) 08/08/2022 0540   Sepsis Labs: '@LABRCNTIP'$ (procalcitonin:4,lacticidven:4)  ) Recent Results (from the past 240 hour(s))  Culture, blood (routine x 2)     Status: None (Preliminary result)   Collection Time: 08/08/22  6:25 AM   Specimen: BLOOD  Result Value Ref Range Status   Specimen Description   Final    BLOOD RIGHT ANTECUBITAL Performed at [facility], [place] [address] [place], [place] [address]    Special Requests   Final    BOTTLES DRAWN AEROBIC AND ANAEROBIC Blood Culture adequate volume Performed at [place] [address] [place], [place] [address]    Culture   Final    NO GROWTH 2 DAYS Performed at [place] [facility] [address], [place], [place] [address]    Report Status PENDING  Incomplete  Respiratory (~20 pathogens) panel by PCR     Status: None   Collection Time: 08/08/22  7:59 AM   Specimen: Nasopharyngeal Swab; Respiratory  Result Value Ref Range Status   Adenovirus NOT  DETECTED NOT DETECTED Final   Coronavirus 229E NOT DETECTED NOT DETECTED Final    Comment: (NOTE) The Coronavirus on the Respiratory Panel, DOES NOT test for the novel  Coronavirus (2019 nCoV)    Coronavirus HKU1 NOT DETECTED NOT DETECTED Final   Coronavirus NL63 NOT DETECTED NOT DETECTED Final   Coronavirus OC43 NOT DETECTED NOT DETECTED Final   Metapneumovirus NOT DETECTED NOT DETECTED Final   Rhinovirus / Enterovirus NOT DETECTED NOT DETECTED Final   Influenza A NOT DETECTED NOT DETECTED Final   Influenza B NOT DETECTED NOT DETECTED Final   Parainfluenza Virus 1 NOT DETECTED NOT DETECTED Final   Parainfluenza Virus 2 NOT DETECTED NOT DETECTED Final   Parainfluenza Virus 3 NOT DETECTED NOT DETECTED Final   Parainfluenza Virus 4 NOT DETECTED NOT DETECTED Final   Respiratory Syncytial Virus NOT DETECTED NOT DETECTED Final   Bordetella  pertussis NOT DETECTED NOT DETECTED Final   Bordetella Parapertussis NOT DETECTED NOT DETECTED Final   Chlamydophila pneumoniae NOT DETECTED NOT DETECTED Final   Mycoplasma pneumoniae NOT DETECTED NOT DETECTED Final    Comment: Performed at [facility], [place] [address] [place], [place] [address]  Culture, blood (routine x 2)     Status: None (Preliminary result)   Collection Time: 08/08/22  9:13 AM   Specimen: BLOOD  Result Value Ref Range Status   Specimen Description   Final    BLOOD BLOOD LEFT HAND AEROBIC BOTTLE ONLY Performed at [facility] [address], [place], [place] [address]    Special Requests   Final    BOTTLES DRAWN AEROBIC ONLY Blood Culture adequate volume Performed at [facility] [address] [place], [place] [address]    Culture   Final    NO GROWTH 2 DAYS Performed at [facility] [address], [place], [place] [address]    Report Status PENDING  Incomplete  Resp panel by RT-PCR (RSV, Flu A&B, Covid) Anterior Nasal Swab     Status: None   Collection Time: 08/09/22  7:07 AM   Specimen: Anterior Nasal Swab  Result Value Ref Range Status   SARS Coronavirus 2 by RT PCR NEGATIVE NEGATIVE Final    Comment: (NOTE) SARS-CoV-2 target nucleic acids are NOT DETECTED.  The SARS-CoV-2 RNA is generally detectable in upper respiratory specimens during the acute phase of infection. The lowest concentration of SARS-CoV-2 viral copies this assay can detect is 138 copies/mL. A negative result does not preclude SARS-Cov-2 infection and should not be used as the sole basis for treatment or other patient management decisions. A negative result may occur with  improper specimen collection/handling, submission of specimen other than nasopharyngeal swab, presence of viral mutation(s) within the areas targeted by this assay, and inadequate number of viral copies(<138 copies/mL). A negative result must be combined  with clinical observations, patient history, and epidemiological information. The expected result is Negative.  Fact Sheet for Patients:  EntrepreneurPulse.com.au  Fact Sheet for Healthcare Providers:  IncredibleEmployment.be  This test is no t yet approved or cleared by the [place] FDA and  has been authorized for detection and/or diagnosis of SARS-CoV-2 by FDA under an Emergency Use Authorization (EUA). This EUA will remain  in effect (meaning this test can be used) for the duration of the COVID-19 declaration under Section 564(b)(1) of the Act, 21 U.S.C.section 360bbb-3(b)(1), unless the authorization is terminated  or revoked sooner.       Influenza A by PCR NEGATIVE  NEGATIVE Final   Influenza B by PCR NEGATIVE NEGATIVE Final    Comment: (NOTE) The Xpert Xpress SARS-CoV-2/FLU/RSV plus assay is intended as an aid in the diagnosis of influenza from Nasopharyngeal swab specimens and should not be used as a sole basis for treatment. Nasal washings and aspirates are unacceptable for Xpert Xpress SARS-CoV-2/FLU/RSV testing.  Fact Sheet for Patients: EntrepreneurPulse.com.au  Fact Sheet for Healthcare Providers: IncredibleEmployment.be  This test is not yet approved or cleared by the [place] FDA and has been authorized for detection and/or diagnosis of SARS-CoV-2 by FDA under an Emergency Use Authorization (EUA). This EUA will remain in effect (meaning this test can be used) for the duration of the COVID-19 declaration under Section 564(b)(1) of the Act, 21 U.S.C. section 360bbb-3(b)(1), unless the authorization is terminated or revoked.     Resp Syncytial Virus by PCR NEGATIVE NEGATIVE Final    Comment: (NOTE) Fact Sheet for Patients: EntrepreneurPulse.com.au  Fact Sheet for Healthcare Providers: IncredibleEmployment.be  This test is not yet approved  or cleared by the [place] FDA and has been authorized for detection and/or diagnosis of SARS-CoV-2 by FDA under an Emergency Use Authorization (EUA). This EUA will remain in effect (meaning this test can be used) for the duration of the COVID-19 declaration under Section 564(b)(1) of the Act, 21 U.S.C. section 360bbb-3(b)(1), unless the authorization is terminated or revoked.  Performed at [facility], [place] [address], [place], [place] [address]       Studies: No results found.  Scheduled Meds:  amLODipine  10 mg Oral Daily   docusate sodium  100 mg Oral BID   furosemide  40 mg Intravenous Daily   insulin aspart  0-15 Units Subcutaneous TID WC   insulin aspart  0-5 Units Subcutaneous QHS   pantoprazole  40 mg Oral Daily   tamsulosin  0.4 mg Oral Daily    Continuous Infusions:  ceFEPime (MAXIPIME) IV 2 g (08/10/22 1345)     LOS: 2 days     Alma Friendly, MD [facility]  If 7PM-7AM, please contact night-coverage [email or web address] 08/10/2022, 3:06 PM

## 2022-08-10 NOTE — Telephone Encounter (Signed)
Returned call to wife Opal Sidles to advise that they will have him follow-up with cardiology upon discharge from the hospital, but while there, they will work him up and rule out any acute cardiac findings. She verbalized understanding.

## 2022-08-10 NOTE — Telephone Encounter (Signed)
Glenard Haring, see message below about pt needing to call their social worker about other insurance coverage. I think they need this first before we can send a prescription in

## 2022-08-11 ENCOUNTER — Inpatient Hospital Stay (HOSPITAL_COMMUNITY): Payer: Medicaid Other

## 2022-08-11 DIAGNOSIS — J810 Acute pulmonary edema: Principal | ICD-10-CM

## 2022-08-11 LAB — BASIC METABOLIC PANEL
Anion gap: 6 (ref 5–15)
BUN: 20 mg/dL (ref 6–20)
CO2: 24 mmol/L (ref 22–32)
Calcium: 8.1 mg/dL — ABNORMAL LOW (ref 8.9–10.3)
Chloride: 101 mmol/L (ref 98–111)
Creatinine, Ser: 1.63 mg/dL — ABNORMAL HIGH (ref 0.61–1.24)
GFR, Estimated: 50 mL/min — ABNORMAL LOW (ref >60)
Glucose, Bld: 116 mg/dL — ABNORMAL HIGH (ref 70–99)
Potassium: 3.9 mmol/L (ref 3.5–5.1)
Sodium: 131 mmol/L — ABNORMAL LOW (ref 135–145)

## 2022-08-11 LAB — CBC
HCT: 42.6 % (ref 39.0–52.0)
Hemoglobin: 13.6 g/dL (ref 13.0–17.0)
MCH: 27.9 pg (ref 26.0–34.0)
MCHC: 31.9 g/dL (ref 30.0–36.0)
MCV: 87.5 fL (ref 80.0–100.0)
Platelets: 213 10*3/uL (ref 150–400)
RBC: 4.87 MIL/uL (ref 4.22–5.81)
RDW: 12.6 % (ref 11.5–15.5)
WBC: 11.5 10*3/uL — ABNORMAL HIGH (ref 4.0–10.5)
nRBC: 0 % (ref 0.0–0.2)

## 2022-08-11 LAB — LEGIONELLA PNEUMOPHILA SEROGP 1 UR AG: L. pneumophila Serogp 1 Ur Ag: NEGATIVE

## 2022-08-11 LAB — GLUCOSE, CAPILLARY
Glucose-Capillary: 119 mg/dL — ABNORMAL HIGH (ref 70–99)
Glucose-Capillary: 131 mg/dL — ABNORMAL HIGH (ref 70–99)
Glucose-Capillary: 111 mg/dL — ABNORMAL HIGH (ref 70–99)
Glucose-Capillary: 180 mg/dL — ABNORMAL HIGH (ref 70–99)

## 2022-08-11 MED ORDER — HYDROMORPHONE HCL 1 MG/ML IJ SOLN
0.5000 mg | INTRAMUSCULAR | Status: AC | PRN
  Administered 2022-08-11 (×2): 0.5 mg via INTRAVENOUS
  Filled 2022-08-11 (×2): qty 0.5

## 2022-08-11 MED ORDER — DIAZEPAM 5 MG PO TABS
10.0000 mg | ORAL_TABLET | Freq: Two times a day (BID) | ORAL | Status: DC
  Administered 2022-08-11: 10 mg via ORAL
  Filled 2022-08-11: qty 2

## 2022-08-11 MED ORDER — PHENAZOPYRIDINE HCL 200 MG PO TABS
200.0000 mg | ORAL_TABLET | Freq: Three times a day (TID) | ORAL | Status: AC
  Administered 2022-08-12 – 2022-08-14 (×9): 200 mg via ORAL
  Filled 2022-08-11 (×9): qty 1

## 2022-08-11 MED ORDER — SENNOSIDES-DOCUSATE SODIUM 8.6-50 MG PO TABS
1.0000 | ORAL_TABLET | Freq: Two times a day (BID) | ORAL | Status: DC
  Administered 2022-08-11 – 2022-08-15 (×9): 1 via ORAL
  Filled 2022-08-11 (×9): qty 1

## 2022-08-11 MED ORDER — BISACODYL 10 MG RE SUPP
10.0000 mg | Freq: Every day | RECTAL | Status: DC | PRN
  Administered 2022-08-11: 10 mg via RECTAL
  Filled 2022-08-11: qty 1

## 2022-08-11 MED ORDER — POLYETHYLENE GLYCOL 3350 17 G PO PACK
17.0000 g | PACK | Freq: Two times a day (BID) | ORAL | Status: DC
  Administered 2022-08-11 – 2022-08-15 (×9): 17 g via ORAL
  Filled 2022-08-11 (×9): qty 1

## 2022-08-11 NOTE — Consult Note (Signed)
Reason for Consult:Recurrent Urolithiasis  Referring Physician:Nkeiruka Horris Latino MD  Danny Brewer is an 52 y.o. male.   HPI:   1 - Recurrent Urolithiasis -  Pre 2023 - medical passage x several  12/2021 - right URS for distal stone  08/2022 - left URS for renal stone to stone free  2 - Medical Stone Disease -  Eval 2023: BMP, PTH, Urate - normal; Composition - 100% CaOx; 24 Hr Urines - pending   3 - Severe Erectile Dysfunction - s/p penile prosthesis in is early 58s by [employer]. H/o severe early onset vascular disase   4 - Prostate Screening - African heritage  2023 - PSA 0.37   5 - Post-Op Pain - some colicky pain after stent pull on 3/4 c/w likely ureteral edema and spasms. Placed on empiric diazepam and phenazopyradine.   PMH sig for CAD/CABG (inhis early 55s, follows [facility] cards, no blood thinners / limitations), biateral inguinal hernia repair with mesh.   Today "Danny Brewer" is seen in consultation for above. Admitted with some hemoptysis few days after uncomplicated ureteroscopy. CT on admit 3/2 stone free, KUB 3/5 with stent out. Hemoptysis eval has been reasurring, favor some pulm edema. Now with some likely ureteral edema colic after stent pull. He remains in good spirits. No high fevers.   Past Medical History:  Diagnosis Date   BPH (benign prostatic hyperplasia)    Coronary Artery Disease    cardiologist--- dr Burt Knack;    Coronary CTA 09/2018: pLAD 70-90; pD1+pD2 70-90; mRCA mixed plaque - cannot rule out 70-90; FFR suggests hemodynamically significant stenosis // LHC 10/2018: 2v CAD >> s/p  CABG (L-LAD, L radial-RI, S-D2, S-RCA) with Dr. 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   LIMA--LAD;  SVG--D2;   SVG--dRCA;  left radial --- RI   Type 2 diabetes mellitus ([place])    followed by pcp   (08-05-2022 checks blood sugar daily am fasting,  fasting sugar-- 77-100)   Wears glasses     Past Surgical History:  Procedure Laterality Date   CORONARY ARTERY BYPASS GRAFT N/A 10/17/2018   Procedure: CORONARY ARTERY BYPASS GRAFTING (CABG), ON PUMP, TIMES FOUR, USING LIMA TO LAD, ENDOSCOPICALLY HARVESTED GREATER SAPHENOUS VEIN TO DIAG 2, SVG TO DISTAL RCA, AND LEFT RADIAL TO RAMUS INTERMEDIUS;  Surgeon: Grace Isaac, MD;  Location: [place];  Service: Open Heart Surgery;  Laterality: N/A;   CYSTOSCOPY WITH RETROGRADE PYELOGRAM, URETEROSCOPY AND STENT PLACEMENT Right 12/30/2021   Procedure: CYSTOSCOPY WITH RETROGRADE PYELOGRAM, URETEROSCOPY AND STENT PLACEMENT, BASKET OF STONES;  Surgeon: Alexis Frock, MD;  Location: [facility];  Service: Urology;  Laterality: Right;   CYSTOSCOPY WITH RETROGRADE PYELOGRAM, URETEROSCOPY AND STENT PLACEMENT Left 08/07/2022   Procedure: CYSTOSCOPY WITH RETROGRADE PYELOGRAM, URETEROSCOPY AND STENT PLACEMENT;  Surgeon: Alexis Frock, MD;  Location: [facility];  Service: Urology;  Laterality: Left;  1 HR   ENDOVEIN HARVEST OF GREATER SAPHENOUS VEIN Right 10/17/2018   Procedure: [place] Of Greater Saphenous Vein;  Surgeon: Grace Isaac, MD;  Location: [place];  Service: Open Heart Surgery;  Laterality: Right;   HAND SURGERY Left 1987  complex laceration   HOLMIUM LASER APPLICATION Left [id number]   Procedure: HOLMIUM LASER APPLICATION;  Surgeon: Alexis Frock, MD;  Location: [facility];  Service: Urology;  Laterality: Left;   INGUINAL HERNIA REPAIR Bilateral 05/27/2015   Procedure: LAPAROSCOPIC BILATERAL  INGUINAL HERNIA REPAIRS WITH MESH;  Surgeon: Michael Boston, MD;  Location: [facility];  Service: General;  Laterality: Bilateral;   KNEE ARTHROSCOPY Left 1991   LEFT HEART CATH AND CORONARY ANGIOGRAPHY N/A 10/14/2018   Procedure: LEFT HEART CATH AND CORONARY ANGIOGRAPHY;   Surgeon: Belva Crome, MD;  Location: [facility];  Service: Cardiovascular;  Laterality: N/A;   PENILE PROSTHESIS IMPLANT N/A 10/03/2020   Procedure: PLACEMENT OF PENILE PROTHESIS INFLATABLE;  Surgeon: Franchot Gallo, MD;  Location: [facility];  Service: Urology;  Laterality: N/A;  pubis area   RADIAL ARTERY HARVEST Left 10/17/2018   Procedure: RADIAL ARTERY HARVEST;  Surgeon: Grace Isaac, MD;  Location: [place];  Service: Open Heart Surgery;  Laterality: Left;   REMOVAL OF PENILE PROSTHESIS N/A 07/07/2021   Procedure: EXPLANT  AND REPLACEMENT OF COLOPLAST TITAN PENILE PROSTHESIS;  Surgeon: Franchot Gallo, MD;  Location: [facility];  Service: Urology;  Laterality: N/A;   SCROTAL EXPLORATION N/A 07/07/2021   Procedure: EXPLORATION OF PENILE PROSTHESIS;  Surgeon: Franchot Gallo, MD;  Location: [facility];  Service: Urology;  Laterality: N/A;   SHOULDER ARTHROSCOPY Right 07/08/2022   '@[facility]'$  by dr supple;   for impingment   SHOULDER CLOSED REDUCTION Left 09/04/2019   Procedure: CLOSED MANIPULATION SHOULDER;  Surgeon: Vickey Huger, MD;  Location: [facility];  Service: Orthopedics;  Laterality: Left;   SHOULDER SURGERY Left 2022   TEE WITHOUT CARDIOVERSION N/A 10/17/2018   Procedure: TRANSESOPHAGEAL ECHOCARDIOGRAM (TEE);  Surgeon: Grace Isaac, MD;  Location: [place];  Service: Open Heart Surgery;  Laterality: N/A;    Family History  Problem Relation Age of Onset   Hypertension Mother    Cancer Father    Heart failure Father    Sudden death Brother    Heart attack Brother    Hypertension Sister    Heart murmur Sister     Social History:  reports that he has never smoked. He has never used smokeless tobacco. He reports that he does not drink alcohol and does not use drugs.  Allergies:  Allergies  Allergen Reactions   Antihistamines, Diphenhydramine-Type Hives and Other (See Comments)    Seizures   Bactrim [Sulfamethoxazole-Trimethoprim] Itching    Throat  itching and suspected pill esophagitis   Demerol Hives   Morphine And Related Hives    Esp with high dose morphine, possibly Dilaudid Tolerates hydrocodone, oxycodone   Compazine [Prochlorperazine] Other (See Comments)    Spastic movement   Metoprolol     Per patient caused "severe headache"   Oxycodone     Alters Personality   Statins Other (See Comments)    myalgias    Medications: I have reviewed the patient's current medications.  Results for orders placed or performed during the hospital encounter of 08/08/22 (from the past 48 hour(s))  Glucose, capillary     Status: Abnormal   Collection Time: 08/09/22  8:31 PM  Result Value Ref Range   Glucose-Capillary 151 (H) 70 - 99 mg/dL    Comment: Glucose reference range applies only to samples taken after fasting for at least 8 hours.  CBC     Status: Abnormal   Collection Time: 08/10/22  4:45 AM  Result Value Ref Range  WBC 10.7 (H) 4.0 - 10.5 K/uL   RBC 4.86 4.22 - 5.81 MIL/uL   Hemoglobin 13.5 13.0 - 17.0 g/dL   HCT 42.9 39.0 - 52.0 %   MCV 88.3 80.0 - 100.0 fL   MCH 27.8 26.0 - 34.0 pg   MCHC 31.5 30.0 - 36.0 g/dL   RDW 12.9 11.5 - 15.5 %   Platelets 195 150 - 400 K/uL   nRBC 0.0 0.0 - 0.2 %    Comment: Performed at [facility], [place] [address], [place], [place] [id number]  Basic metabolic panel     Status: Abnormal   Collection Time: 08/10/22  4:45 AM  Result Value Ref Range   Sodium 136 135 - 145 mmol/L   Potassium 3.9 3.5 - 5.1 mmol/L   Chloride 102 98 - 111 mmol/L   CO2 24 22 - 32 mmol/L   Glucose, Bld 107 (H) 70 - 99 mg/dL    Comment: Glucose reference range applies only to samples taken after fasting for at least 8 hours.   BUN 16 6 - 20 mg/dL   Creatinine, Ser 1.22 0.61 - 1.24 mg/dL   Calcium 8.5 (L) 8.9 - 10.3 mg/dL   GFR, Estimated >60 >60 mL/min    Comment: (NOTE) Calculated using the CKD-EPI Creatinine Equation (2021)    Anion gap 10 5 - 15    Comment: Performed at [facility], [place] [address], [place], [place] [address]  Procalcitonin     Status: None   Collection Time: 08/10/22  4:45 AM  Result Value Ref Range   Procalcitonin 0.12 ng/mL    Comment:        Interpretation: PCT (Procalcitonin) <= 0.5 ng/mL: Systemic infection (sepsis) is not likely. Local bacterial infection is possible. (NOTE)       Sepsis PCT Algorithm           Lower Respiratory Tract                                      Infection PCT Algorithm    ----------------------------     ----------------------------         PCT < 0.25 ng/mL                PCT < 0.10 ng/mL          Strongly encourage             Strongly discourage   discontinuation of antibiotics    initiation of antibiotics    ----------------------------     -----------------------------       PCT 0.25 - 0.50 ng/mL            PCT 0.10 - 0.25 ng/mL               OR       >80% decrease in PCT            Discourage initiation of                                            antibiotics      Encourage discontinuation           of antibiotics    ----------------------------     -----------------------------  PCT >= 0.50 ng/mL              PCT 0.26 - 0.50 ng/mL               AND        <80% decrease in PCT             Encourage initiation of                                             antibiotics       Encourage continuation           of antibiotics    ----------------------------     -----------------------------        PCT >= 0.50 ng/mL                  PCT > 0.50 ng/mL               AND         increase in PCT                  Strongly encourage                                      initiation of antibiotics    Strongly encourage escalation           of antibiotics                                     -----------------------------                                           PCT <= 0.25 ng/mL                                                 OR                                        > 80% decrease in  PCT                                      Discontinue / Do not initiate                                             antibiotics  Performed at [place] [address], [place], [place] [address]   Glucose, capillary     Status: Abnormal   Collection Time: 08/10/22  7:28 AM  Result Value Ref Range   Glucose-Capillary 103 (H) 70 - 99 mg/dL    Comment: Glucose reference range applies only to samples taken after fasting for at least 8  hours.  Glucose, capillary     Status: Abnormal   Collection Time: 08/10/22 11:48 AM  Result Value Ref Range   Glucose-Capillary 196 (H) 70 - 99 mg/dL    Comment: Glucose reference range applies only to samples taken after fasting for at least 8 hours.  Glucose, capillary     Status: Abnormal   Collection Time: 08/10/22  4:51 PM  Result Value Ref Range   Glucose-Capillary 113 (H) 70 - 99 mg/dL    Comment: Glucose reference range applies only to samples taken after fasting for at least 8 hours.  Glucose, capillary     Status: Abnormal   Collection Time: 08/10/22  9:08 PM  Result Value Ref Range   Glucose-Capillary 126 (H) 70 - 99 mg/dL    Comment: Glucose reference range applies only to samples taken after fasting for at least 8 hours.  CBC     Status: Abnormal   Collection Time: 08/11/22  5:14 AM  Result Value Ref Range   WBC 11.5 (H) 4.0 - 10.5 K/uL   RBC 4.87 4.22 - 5.81 MIL/uL   Hemoglobin 13.6 13.0 - 17.0 g/dL   HCT 42.6 39.0 - 52.0 %   MCV 87.5 80.0 - 100.0 fL   MCH 27.9 26.0 - 34.0 pg   MCHC 31.9 30.0 - 36.0 g/dL   RDW 12.6 11.5 - 15.5 %   Platelets 213 150 - 400 K/uL   nRBC 0.0 0.0 - 0.2 %    Comment: Performed at [facility], [place] [address], [place], [place] [id number]  Basic metabolic panel     Status: Abnormal   Collection Time: 08/11/22  5:14 AM  Result Value Ref Range   Sodium 131 (L) 135 - 145 mmol/L   Potassium 3.9 3.5 - 5.1 mmol/L   Chloride 101 98 - 111 mmol/L   CO2 24 22 - 32 mmol/L    Glucose, Bld 116 (H) 70 - 99 mg/dL    Comment: Glucose reference range applies only to samples taken after fasting for at least 8 hours.   BUN 20 6 - 20 mg/dL   Creatinine, Ser 1.63 (H) 0.61 - 1.24 mg/dL   Calcium 8.1 (L) 8.9 - 10.3 mg/dL   GFR, Estimated 50 (L) >60 mL/min    Comment: (NOTE) Calculated using the CKD-EPI Creatinine Equation (2021)    Anion gap 6 5 - 15    Comment: Performed at [facility], [place] [address] [place], [place] [address]  Glucose, capillary     Status: Abnormal   Collection Time: 08/11/22  7:30 AM  Result Value Ref Range   Glucose-Capillary 119 (H) 70 - 99 mg/dL    Comment: Glucose reference range applies only to samples taken after fasting for at least 8 hours.  Glucose, capillary     Status: Abnormal   Collection Time: 08/11/22 11:20 AM  Result Value Ref Range   Glucose-Capillary 131 (H) 70 - 99 mg/dL    Comment: Glucose reference range applies only to samples taken after fasting for at least 8 hours.    DG Abd Portable 1V  Result Date: 08/11/2022 CLINICAL DATA:  Abdominal pain EXAM: PORTABLE ABDOMEN - 1 VIEW COMPARISON:  06/10/2022 FINDINGS: None bowel gas pattern is nonspecific. Upper abdomen is not included in its entirety. No abnormal masses or calcifications are seen. Degenerative changes are noted in lumbar spine. Last lumbar would raise transitional. IMPRESSION: Nonspecific bowel gas pattern. Electronically Signed   By: Prudy Feeler.D.  On: 08/11/2022 10:11   DG CHEST PORT 1 VIEW  Result Date: 08/11/2022 CLINICAL DATA:  [id number] Fluid overload [id number] EXAM: PORTABLE CHEST - 1 VIEW COMPARISON:  08/08/2022 FINDINGS: Cardiac silhouette is unremarkable. No pneumothorax or pleural effusion. The lungs are clear. The visualized skeletal structures are unremarkable. Patient is status post median sternotomy and CABG. IMPRESSION: No acute cardiopulmonary process. Electronically Signed   By: Sammie Bench M.D.   On: 08/11/2022 08:26     Review of Systems  Constitutional:  Positive for fatigue. Negative for chills and fever.  Respiratory:  Negative for apnea, cough and shortness of breath.   Genitourinary:  Positive for flank pain and urgency.  All other systems reviewed and are negative.  Blood pressure (!) 123/94, pulse (!) 108, temperature 98 F (36.7 C), temperature source Oral, resp. rate 20, height '5\' 9"'$  (1.753 m), weight 87.1 kg, SpO2 99 %. Physical Exam Vitals reviewed.  Constitutional:      Comments: In bed, wife at bedside, some mild visible disocmfort.   Cardiovascular:     Rate and Rhythm: Normal rate.  Pulmonary:     Effort: Pulmonary effort is normal.     Comments: Non-labored on minimal [place] O2.  Abdominal:     Comments: SNTND. No r/g.   Genitourinary:    Comments: No frank CVAT at present. No catheters.  Musculoskeletal:     Cervical back: Normal range of motion.  Neurological:     Mental Status: He is alert.     Assessment/Plan:  Pt's imaging and exam reassuring. He is likely stone free based on intra-op findings at end of recnet surgery. Favor current sympotms from some ureteral edema, he is unfortunately very pain sensitive. I ordered diazepam BID and phenazophyradine to help, we will review repeat imaging that is pending.   Greatly appreciate Dr. Horris Latino and hospitalist team comanagment. Pt also very gracious.   Will follow. Please call me directly anytime.   Alexis Frock 08/11/2022, 4:36 PM

## 2022-08-11 NOTE — Progress Notes (Signed)
Patient now with Colicky L flank pain after ureteral stent was removed on 08/10/22. CT renal stone pending. Urology consulted. Discharge pended. Pain management

## 2022-08-11 NOTE — Discharge Summary (Signed)
Physician Discharge Summary   Patient: Danny Brewer MRN: [id number] DOB: August 29, 1970  Admit date:     08/08/2022  Discharge date: 08/11/22  Discharge Physician: Alma Friendly   PCP: Lurline Del, DO   Recommendations at discharge:   Follow-up with PCP in 1 week Follow-up with urology as scheduled  Discharge Diagnoses: Principal Problem:   Hemoptysis Active Problems:   GERD (gastroesophageal reflux disease)   Coronary artery disease   Essential hypertension   Hyperlipidemia   Type 2 diabetes mellitus without complication, without long-term current use of insulin ([place])   S/P cystoscopy with ureteral stent placement    Hospital Course: Danny Brewer is a 52 y.o. male with medical history significant for BPH, CAD s/p CABG in 2020, GERD, history of kidney stones, HTN, DM2 presented to the ED with complaints of hemoptysis, chest discomfort after cystoscopy and stent placement for obstructing renal stone on 3/1. Cystoscopy procedure went well, however afterwards he was having little bit of trouble breathing, he remembers being given multiple breathing treatments, and stayed in PACU for several hours until he was stabilized.  He recalls having an episode of hemoptysis while in the postprocedure area, eventually was discharged home on room air. Shortly after d/c noted hemoptysis, with pleuritic chest pain. In the ER, he was saturating well on room air, lab work was relatively unremarkable, had 2 negative troponins,. Patient admitted for further management.     Today, patient reports some left-sided/back pain likely 2/2 from recent procedure as well as stent removal.  Denies any chest pain, shortness of breath, nausea/vomiting, fever/chills.   Assessment and Plan:  Likely pulmonary edema s/p surgery Currently afebrile, with no leukocytosis, currently on room air Respiratory panel negative Procalcitonin 0.25-->0.12 CTA chest negative for PE, noted diffuse scattered groundglass  opacities in the lungs bilaterally with a central predominance possible edema versus multifocal pneumonia Strep urine antigen negative, Legionella urine antigen pending S/p cefepime X 5 days S/p Lasix Follow-up with PCP  AKI Likely due to diuresis Encouraged oral intake Follow-up with PCP  CAD s/p CABG in 2020 Hyperlipidemia Chest pain-free Troponins negative, EKG with no acute ST changes   Hypertension Continue amlodipine   Diabetes mellitus type 2 Last A1c 6.2 Continue home regimen   GERD Continue PPI   S/p cystoscopy with ureteral stent placement Continue Flomax       Consultants: None Procedures performed: None Disposition: Home Diet recommendation:  Discharge Diet Orders (From admission, onward)     Start     Ordered   08/11/22 0000  Diet - low sodium heart healthy        08/11/22 1356           Cardiac and Carb modified diet   DISCHARGE MEDICATION: Allergies as of 08/11/2022       Reactions   Antihistamines, Diphenhydramine-type Hives, Other (See Comments)   Seizures   Bactrim [sulfamethoxazole-trimethoprim] Itching   Throat itching and suspected pill esophagitis   Demerol Hives   Morphine And Related Hives   Esp with high dose morphine, possibly Dilaudid Tolerates hydrocodone, oxycodone   Compazine [prochlorperazine] Other (See Comments)   Spastic movement   Metoprolol    Per patient caused "severe headache"   Oxycodone    Alters Personality   Statins Other (See Comments)   myalgias        Medication List     STOP taking these medications    botulinum toxin Type A 100 units Solr injection Commonly known as: BOTOX  cephALEXin 500 MG capsule Commonly known as: KEFLEX   ketorolac 10 MG tablet Commonly known as: TORADOL       TAKE these medications    acetaminophen 500 MG tablet Commonly known as: TYLENOL Take 2 tablets (1,000 mg total) by mouth every 6 (six) hours. What changed:  when to take this reasons to take  this   amLODipine 10 MG tablet Commonly known as: NORVASC Take 1 tablet (10 mg total) by mouth daily.   aspirin 81 MG tablet Take 1 tablet (81 mg total) by mouth daily.   ezetimibe 10 MG tablet Commonly known as: ZETIA Take 1 tablet (10 mg total) by mouth daily.   oxyCODONE 5 MG immediate release tablet Commonly known as: Roxicodone Take 1 tablet (5 mg total) by mouth every 6 (six) hours as needed for severe pain (post-operatively).   Praluent 75 MG/ML Soaj Generic drug: Alirocumab INJECT 1 ML INTO THE SKIN EVERY 14 (FOURTEEN) DAYS.   senna-docusate 8.6-50 MG tablet Commonly known as: Senokot-S Take 1 tablet by mouth 2 (two) times daily. While taking strongest pain meds to prevent constipation   sitaGLIPtin 100 MG tablet Commonly known as: JANUVIA Take 100 mg by mouth daily.   tamsulosin 0.4 MG Caps capsule Commonly known as: FLOMAX Take 0.4 mg by mouth daily.        Follow-up Information     Lurline Del, DO. Schedule an appointment as soon as possible for a visit in 1 week(s).   Specialty: Family Medicine Why: to follow up on your kidney function Contact information: [place] [address] [phone]                Discharge Exam: Danny Brewer Weights   08/08/22 0227  Weight: 87.1 kg   General: NAD  Cardiovascular: S1, S2 present Respiratory: CTAB Abdomen: Soft, nontender, nondistended, bowel sounds present Musculoskeletal: No bilateral pedal edema noted Skin: Normal Psychiatry: Normal mood   Condition at discharge: stable  The results of significant diagnostics from this hospitalization (including imaging, microbiology, ancillary and laboratory) are listed below for reference.   Imaging Studies: DG Abd Portable 1V  Result Date: 08/11/2022 CLINICAL DATA:  Abdominal pain EXAM: PORTABLE ABDOMEN - 1 VIEW COMPARISON:  06/10/2022 FINDINGS: None bowel gas pattern is nonspecific. Upper abdomen is not included in its entirety. No  abnormal masses or calcifications are seen. Degenerative changes are noted in lumbar spine. Last lumbar would raise transitional. IMPRESSION: Nonspecific bowel gas pattern. Electronically Signed   By: Elmer Picker M.D.   On: 08/11/2022 10:11   DG CHEST PORT 1 VIEW  Result Date: 08/11/2022 CLINICAL DATA:  [id number] Fluid overload [id number] EXAM: PORTABLE CHEST - 1 VIEW COMPARISON:  08/08/2022 FINDINGS: Cardiac silhouette is unremarkable. No pneumothorax or pleural effusion. The lungs are clear. The visualized skeletal structures are unremarkable. Patient is status post median sternotomy and CABG. IMPRESSION: No acute cardiopulmonary process. Electronically Signed   By: Sammie Bench M.D.   On: 08/11/2022 08:26   CT Angio Chest PE W/Cm &/Or Wo Cm  Result Date: 08/08/2022 CLINICAL DATA:  Postop abdominal pain. Pulmonary embolism suspected, high probability. Hemoptysis for 2 hours. EXAM: CT ANGIOGRAPHY CHEST CT ABDOMEN AND PELVIS WITH CONTRAST TECHNIQUE: Multidetector CT imaging of the chest was performed using the standard protocol during bolus administration of intravenous contrast. Multiplanar CT image reconstructions and MIPs were obtained to evaluate the vascular anatomy. Multidetector CT imaging of the abdomen and pelvis was performed using the standard protocol during bolus administration  of intravenous contrast. RADIATION DOSE REDUCTION: This exam was performed according to the departmental dose-optimization program which includes automated exposure control, adjustment of the mA and/or kV according to patient size and/or use of iterative reconstruction technique. CONTRAST:  179m OMNIPAQUE IOHEXOL 350 MG/ML SOLN COMPARISON:  05/28/2022. FINDINGS: CTA CHEST FINDINGS Cardiovascular: The heart is normal in size and there is no pericardial effusion. Scattered coronary artery calcifications are noted. The aorta and pulmonary trunk are normal in caliber. No pulmonary artery filling defect is seen.  Mediastinum/Nodes: No enlarged mediastinal, hilar, or axillary lymph nodes. Thyroid gland, trachea, and esophagus demonstrate no significant findings. Lungs/Pleura: Scattered ground-glass opacities are noted in the lungs bilaterally with a central predominance. No effusion or pneumothorax. Musculoskeletal: Sternotomy wires are noted. No acute osseous abnormality. Review of the MIP images confirms the above findings. CT ABDOMEN and PELVIS FINDINGS Hepatobiliary: No focal liver abnormality is seen. No gallstones, gallbladder wall thickening, or biliary dilatation. Pancreas: Unremarkable. No pancreatic ductal dilatation or surrounding inflammatory changes. Spleen: Normal in size without focal abnormality. Adrenals/Urinary Tract: No adrenal nodule or mass. The kidneys enhance symmetrically. A few punctate renal calculi are noted bilaterally. A left ureteral stent is in place and terminates in the prostate gland. No ureteral calculus. Small focus of air is noted in the urinary bladder which may be iatrogenic. Stomach/Bowel: Stomach is within normal limits. Appendix appears normal. No evidence of bowel wall thickening, distention, or inflammatory changes. No free air or pneumatosis. Vascular/Lymphatic: No significant vascular findings are present. No enlarged abdominal or pelvic lymph nodes. Reproductive: The prostate gland is normal in size. A penile prosthesis is noted with reservoir in the right lower quadrant. Other: No abdominopelvic ascites. Musculoskeletal: Degenerative changes in the lumbar spine. No acute osseous abnormality. Review of the MIP images confirms the above findings. IMPRESSION: 1. No evidence of pulmonary embolism. 2. Diffuse scattered ground-glass opacities in the lungs bilaterally with a central predominance, possible edema versus multifocal pneumonia. 3. No acute process in the abdomen and pelvis. 4. Punctate renal calculi bilaterally with no obstructive uropathy. A left ureteral stent is in place  which terminates in the prostate gland. 5. Remaining incidental findings as described above. Electronically Signed   By: LBrett FairyM.D.   On: 08/08/2022 04:36   CT Abdomen Pelvis W Contrast  Result Date: 08/08/2022 CLINICAL DATA:  Postop abdominal pain. Pulmonary embolism suspected, high probability. Hemoptysis for 2 hours. EXAM: CT ANGIOGRAPHY CHEST CT ABDOMEN AND PELVIS WITH CONTRAST TECHNIQUE: Multidetector CT imaging of the chest was performed using the standard protocol during bolus administration of intravenous contrast. Multiplanar CT image reconstructions and MIPs were obtained to evaluate the vascular anatomy. Multidetector CT imaging of the abdomen and pelvis was performed using the standard protocol during bolus administration of intravenous contrast. RADIATION DOSE REDUCTION: This exam was performed according to the departmental dose-optimization program which includes automated exposure control, adjustment of the mA and/or kV according to patient size and/or use of iterative reconstruction technique. CONTRAST:  106mOMNIPAQUE IOHEXOL 350 MG/ML SOLN COMPARISON:  05/28/2022. FINDINGS: CTA CHEST FINDINGS Cardiovascular: The heart is normal in size and there is no pericardial effusion. Scattered coronary artery calcifications are noted. The aorta and pulmonary trunk are normal in caliber. No pulmonary artery filling defect is seen. Mediastinum/Nodes: No enlarged mediastinal, hilar, or axillary lymph nodes. Thyroid gland, trachea, and esophagus demonstrate no significant findings. Lungs/Pleura: Scattered ground-glass opacities are noted in the lungs bilaterally with a central predominance. No effusion or pneumothorax. Musculoskeletal: Sternotomy wires are  noted. No acute osseous abnormality. Review of the MIP images confirms the above findings. CT ABDOMEN and PELVIS FINDINGS Hepatobiliary: No focal liver abnormality is seen. No gallstones, gallbladder wall thickening, or biliary dilatation. Pancreas:  Unremarkable. No pancreatic ductal dilatation or surrounding inflammatory changes. Spleen: Normal in size without focal abnormality. Adrenals/Urinary Tract: No adrenal nodule or mass. The kidneys enhance symmetrically. A few punctate renal calculi are noted bilaterally. A left ureteral stent is in place and terminates in the prostate gland. No ureteral calculus. Small focus of air is noted in the urinary bladder which may be iatrogenic. Stomach/Bowel: Stomach is within normal limits. Appendix appears normal. No evidence of bowel wall thickening, distention, or inflammatory changes. No free air or pneumatosis. Vascular/Lymphatic: No significant vascular findings are present. No enlarged abdominal or pelvic lymph nodes. Reproductive: The prostate gland is normal in size. A penile prosthesis is noted with reservoir in the right lower quadrant. Other: No abdominopelvic ascites. Musculoskeletal: Degenerative changes in the lumbar spine. No acute osseous abnormality. Review of the MIP images confirms the above findings. IMPRESSION: 1. No evidence of pulmonary embolism. 2. Diffuse scattered ground-glass opacities in the lungs bilaterally with a central predominance, possible edema versus multifocal pneumonia. 3. No acute process in the abdomen and pelvis. 4. Punctate renal calculi bilaterally with no obstructive uropathy. A left ureteral stent is in place which terminates in the prostate gland. 5. Remaining incidental findings as described above. Electronically Signed   By: Brett Fairy M.D.   On: 08/08/2022 04:36   DG Chest Port 1 View  Result Date: 08/08/2022 CLINICAL DATA:  Chest pain EXAM: PORTABLE CHEST 1 VIEW COMPARISON:  02/27/2022 FINDINGS: Cardiac shadow is stable. Postsurgical changes are again seen. Lungs are well aerated bilaterally. Central increased perihilar density is noted consistent with mild pulmonary edema. No sizable effusion is seen. No acute bony abnormality is noted. IMPRESSION: Mild increased  perihilar density consistent with pulmonary edema. Electronically Signed   By: Inez Catalina M.D.   On: 08/08/2022 03:06   DG C-Arm 1-60 Min-No Report  Result Date: 08/07/2022 Fluoroscopy was utilized by the requesting physician.  No radiographic interpretation.    Microbiology: Results for orders placed or performed during the hospital encounter of 08/08/22  Culture, blood (routine x 2)     Status: None (Preliminary result)   Collection Time: 08/08/22  6:25 AM   Specimen: BLOOD  Result Value Ref Range Status   Specimen Description   Final    BLOOD RIGHT ANTECUBITAL Performed at [facility] [address], [place], [place] [address]    Special Requests   Final    BOTTLES DRAWN AEROBIC AND ANAEROBIC Blood Culture adequate volume Performed at [facility] [address], [place], [place] [address]    Culture   Final    NO GROWTH 3 DAYS Performed at [facility], [place] [address], [place], [place] [address]    Report Status PENDING  Incomplete  Respiratory (~20 pathogens) panel by PCR     Status: None   Collection Time: 08/08/22  7:59 AM   Specimen: Nasopharyngeal Swab; Respiratory  Result Value Ref Range Status   Adenovirus NOT DETECTED NOT DETECTED Final   Coronavirus 229E NOT DETECTED NOT DETECTED Final    Comment: (NOTE) The Coronavirus on the Respiratory Panel, DOES NOT test for the novel  Coronavirus (2019 nCoV)    Coronavirus HKU1 NOT DETECTED NOT DETECTED Final   Coronavirus NL63 NOT DETECTED NOT DETECTED Final  Coronavirus OC43 NOT DETECTED NOT DETECTED Final   Metapneumovirus NOT DETECTED NOT DETECTED Final   Rhinovirus / Enterovirus NOT DETECTED NOT DETECTED Final   Influenza A NOT DETECTED NOT DETECTED Final   Influenza B NOT DETECTED NOT DETECTED Final   Parainfluenza Virus 1 NOT DETECTED NOT DETECTED Final   Parainfluenza Virus 2 NOT DETECTED NOT DETECTED Final   Parainfluenza Virus 3 NOT DETECTED NOT DETECTED Final    Parainfluenza Virus 4 NOT DETECTED NOT DETECTED Final   Respiratory Syncytial Virus NOT DETECTED NOT DETECTED Final   Bordetella pertussis NOT DETECTED NOT DETECTED Final   Bordetella Parapertussis NOT DETECTED NOT DETECTED Final   Chlamydophila pneumoniae NOT DETECTED NOT DETECTED Final   Mycoplasma pneumoniae NOT DETECTED NOT DETECTED Final    Comment: Performed at [facility], [place] [address] [place], [place] [address]  Culture, blood (routine x 2)     Status: None (Preliminary result)   Collection Time: 08/08/22  9:13 AM   Specimen: BLOOD  Result Value Ref Range Status   Specimen Description   Final    BLOOD BLOOD LEFT HAND AEROBIC BOTTLE ONLY Performed at [facility] [address] [place], [place] [address]    Special Requests   Final    BOTTLES DRAWN AEROBIC ONLY Blood Culture adequate volume Performed at [facility] [address], [place], [place] [address]    Culture   Final    NO GROWTH 3 DAYS Performed at [facility], [place] [address], [place],  [address]    Report Status PENDING  Incomplete  Resp panel by RT-PCR (RSV, Flu A&B, Covid) Anterior Nasal Swab     Status: None   Collection Time: 08/09/22  7:07 AM   Specimen: Anterior Nasal Swab  Result Value Ref Range Status   SARS Coronavirus 2 by RT PCR NEGATIVE NEGATIVE Final    Comment: (NOTE) SARS-CoV-2 target nucleic acids are NOT DETECTED.  The SARS-CoV-2 RNA is generally detectable in upper respiratory specimens during the acute phase of infection. The lowest concentration of SARS-CoV-2 viral copies this assay can detect is 138 copies/mL. A negative result does not preclude SARS-Cov-2 infection and should not be used as the sole basis for treatment or other patient management decisions. A negative result may occur with  improper specimen collection/handling, submission of specimen other than nasopharyngeal swab, presence of viral mutation(s) within  the areas targeted by this assay, and inadequate number of viral copies(<138 copies/mL). A negative result must be combined with clinical observations, patient history, and epidemiological information. The expected result is Negative.  Fact Sheet for Patients:  EntrepreneurPulse.com.au  Fact Sheet for Healthcare Providers:  IncredibleEmployment.be  This test is no t yet approved or cleared by the [place] FDA and  has been authorized for detection and/or diagnosis of SARS-CoV-2 by FDA under an Emergency Use Authorization (EUA). This EUA will remain  in effect (meaning this test can be used) for the duration of the COVID-19 declaration under Section 564(b)(1) of the Act, 21 U.S.C.section 360bbb-3(b)(1), unless the authorization is terminated  or revoked sooner.       Influenza A by PCR NEGATIVE NEGATIVE Final   Influenza B by PCR NEGATIVE NEGATIVE Final    Comment: (NOTE) The Xpert Xpress SARS-CoV-2/FLU/RSV plus assay is intended as an aid in the diagnosis of influenza from Nasopharyngeal swab specimens and should not be used as a sole basis for treatment. Nasal washings and aspirates are unacceptable for Xpert  Xpress SARS-CoV-2/FLU/RSV testing.  Fact Sheet for Patients: EntrepreneurPulse.com.au  Fact Sheet for Healthcare Providers: IncredibleEmployment.be  This test is not yet approved or cleared by the [place] FDA and has been authorized for detection and/or diagnosis of SARS-CoV-2 by FDA under an Emergency Use Authorization (EUA). This EUA will remain in effect (meaning this test can be used) for the duration of the COVID-19 declaration under Section 564(b)(1) of the Act, 21 U.S.C. section 360bbb-3(b)(1), unless the authorization is terminated or revoked.     Resp Syncytial Virus by PCR NEGATIVE NEGATIVE Final    Comment: (NOTE) Fact Sheet for  Patients: EntrepreneurPulse.com.au  Fact Sheet for Healthcare Providers: IncredibleEmployment.be  This test is not yet approved or cleared by the [place] FDA and has been authorized for detection and/or diagnosis of SARS-CoV-2 by FDA under an Emergency Use Authorization (EUA). This EUA will remain in effect (meaning this test can be used) for the duration of the COVID-19 declaration under Section 564(b)(1) of the Act, 21 U.S.C. section 360bbb-3(b)(1), unless the authorization is terminated or revoked.  Performed at [facility], [place] [address] [place], [place] [address]     Labs: CBC: Recent Labs  Lab 08/08/22 0251 08/08/22 0259 08/08/22 0758 08/09/22 0507 08/10/22 0445 08/11/22 0514  WBC 8.7  --   --  10.5 10.7* 11.5*  HGB 11.7* 12.9*  --  12.3* 13.5 13.6  HCT 38.3* 38.0*  --  38.8* 42.9 42.6  MCV 90.3  --   --  88.8 88.3 87.5  PLT 30*  --  200 196 195 [id number]   Basic Metabolic Panel: Recent Labs  Lab 08/08/22 0259 08/08/22 0345 08/09/22 0507 08/10/22 0445 08/11/22 0514  NA 135 136 138 136 131*  K 5.3* 3.9 4.0 3.9 3.9  CL 104 106 111 102 101  CO2  --  '22 22 24 24  '$ GLUCOSE 125* 118* 106* 107* 116*  BUN 31* 22* 21* 16 20  CREATININE 1.50* 1.47* 1.33* 1.22 1.63*  CALCIUM  --  8.4* 8.2* 8.5* 8.1*   Liver Function Tests: Recent Labs  Lab 08/08/22 0345  AST 19  ALT 14  ALKPHOS 46  BILITOT 0.4  PROT 6.2*  ALBUMIN 3.3*   CBG: Recent Labs  Lab 08/10/22 1148 08/10/22 1651 08/10/22 2108 08/11/22 0730 08/11/22 1120  GLUCAP 196* 113* 126* 119* 131*    Discharge time spent: greater than 30 minutes.  Signed: Alma Friendly, MD [facility] 08/11/2022

## 2022-08-12 ENCOUNTER — Inpatient Hospital Stay (HOSPITAL_COMMUNITY): Payer: Medicaid Other

## 2022-08-12 LAB — BASIC METABOLIC PANEL
Anion gap: 7 (ref 5–15)
BUN: 19 mg/dL (ref 6–20)
CO2: 24 mmol/L (ref 22–32)
Calcium: 8.5 mg/dL — ABNORMAL LOW (ref 8.9–10.3)
Chloride: 104 mmol/L (ref 98–111)
Creatinine, Ser: 1.36 mg/dL — ABNORMAL HIGH (ref 0.61–1.24)
GFR, Estimated: >60 mL/min (ref >60)
Glucose, Bld: 105 mg/dL — ABNORMAL HIGH (ref 70–99)
Potassium: 4 mmol/L (ref 3.5–5.1)
Sodium: 135 mmol/L (ref 135–145)

## 2022-08-12 LAB — CBC
HCT: 41.8 % (ref 39.0–52.0)
Hemoglobin: 13.1 g/dL (ref 13.0–17.0)
MCH: 27.4 pg (ref 26.0–34.0)
MCHC: 31.3 g/dL (ref 30.0–36.0)
MCV: 87.4 fL (ref 80.0–100.0)
Platelets: 217 10*3/uL (ref 150–400)
RBC: 4.78 MIL/uL (ref 4.22–5.81)
RDW: 12.5 % (ref 11.5–15.5)
WBC: 9.9 10*3/uL (ref 4.0–10.5)
nRBC: 0 % (ref 0.0–0.2)

## 2022-08-12 LAB — GLUCOSE, CAPILLARY
Glucose-Capillary: 99 mg/dL (ref 70–99)
Glucose-Capillary: 97 mg/dL (ref 70–99)
Glucose-Capillary: 104 mg/dL — ABNORMAL HIGH (ref 70–99)
Glucose-Capillary: 131 mg/dL — ABNORMAL HIGH (ref 70–99)

## 2022-08-12 MED ORDER — LIDOCAINE 5 % EX PTCH
1.0000 | MEDICATED_PATCH | CUTANEOUS | Status: DC
  Administered 2022-08-12: 1 via TRANSDERMAL
  Filled 2022-08-12 (×2): qty 1

## 2022-08-12 MED ORDER — FLEET ENEMA 7-19 GM/118ML RE ENEM
1.0000 | ENEMA | Freq: Every day | RECTAL | Status: DC | PRN
  Administered 2022-08-12: 1 via RECTAL
  Filled 2022-08-12: qty 1

## 2022-08-12 MED ORDER — SIMETHICONE 80 MG PO CHEW
80.0000 mg | CHEWABLE_TABLET | Freq: Once | ORAL | Status: AC
  Administered 2022-08-12: 80 mg via ORAL
  Filled 2022-08-12: qty 1

## 2022-08-12 MED ORDER — METHOCARBAMOL 500 MG PO TABS
500.0000 mg | ORAL_TABLET | Freq: Three times a day (TID) | ORAL | Status: DC
  Administered 2022-08-12 (×3): 500 mg via ORAL
  Filled 2022-08-12 (×3): qty 1

## 2022-08-12 NOTE — Progress Notes (Signed)
Subjective/Chief Complaint:  1 - Pain After Ureteroscopy - pt with some left flank pain after uncomplicated ureteroscopy to stone free. CT 3/5 with some mild hydro c/w ureteral edema but without obstructing stones, just some papillary tip calcs (submucosal, not even within urinary channels as documented on ureteroscopy).   Given symptomatic managemtn with pain meds, muscle relaxants (diazepam), urinary analgesics.   Today "Danny Brewer" is improving. Biggest complaint is lingering constipation, but getting there. No fevers. Flank pain trending down.    Objective: Vital signs in last 24 hours: Temp:  [97.8 F (36.6 C)-98.5 F (36.9 C)] 98.5 F (36.9 C) (03/06 0459) Pulse Rate:  [96-108] 101 (03/06 0459) Resp:  [16-20] 20 (03/06 0459) BP: (112-123)/(75-94) 122/86 (03/06 0459) SpO2:  [97 %-99 %] 97 % (03/06 0459) Last BM Date : 08/09/22  Intake/Output from previous day: 03/05 0701 - 03/06 0700 In: 1120 [P.O.:1020; IV Piggyback:100] Out: -  Intake/Output this shift: Total I/O In: 640 [P.O.:540; IV Piggyback:100] Out: -   NAD, getting ready to try to have BM Non-labored breathing on RA RRR SNTND No frank CVAT No GU tubes  Lab Results:  Recent Labs    08/11/22 0514 08/12/22 0510  WBC 11.5* 9.9  HGB 13.6 13.1  HCT 42.6 41.8  PLT 213 217   BMET Recent Labs    08/11/22 0514 08/12/22 0510  NA 131* 135  K 3.9 4.0  CL 101 104  CO2 24 24  GLUCOSE 116* 105*  BUN 20 19  CREATININE 1.63* 1.36*  CALCIUM 8.1* 8.5*   PT/INR No results for input(s): "LABPROT", "INR" in the last 72 hours. ABG No results for input(s): "PHART", "HCO3" in the last 72 hours.  Invalid input(s): "PCO2", "PO2"  Studies/Results: CT RENAL STONE STUDY  Result Date: 08/11/2022 CLINICAL DATA:  Left flank pain, ureteral stent removal EXAM: CT ABDOMEN AND PELVIS WITHOUT CONTRAST TECHNIQUE: Multidetector CT imaging of the abdomen and pelvis was performed following the standard protocol without IV  contrast. RADIATION DOSE REDUCTION: This exam was performed according to the departmental dose-optimization program which includes automated exposure control, adjustment of the mA and/or kV according to patient size and/or use of iterative reconstruction technique. COMPARISON:  08/08/2022 FINDINGS: Lower chest: Mild bibasilar atelectasis. Hepatobiliary: Unenhanced liver is unremarkable. Gallbladder is unremarkable. No intrahepatic or extrahepatic duct dilatation. Pancreas: Within normal limits. Spleen: Within normal limits. Adrenals/Urinary Tract: Adrenal glands are within normal limits. Right kidney is within normal limits. 3 mm nonobstructing left upper pole renal calculus (series 2/image 31). Additional 2 mm nonobstructing lower pole renal calculus (series 2/image 34). Interval removal of prior left ureteral stent, now with mild left hydroureteronephrosis. Bladder is underdistended. Stomach/Bowel: Stomach is within normal limits. No evidence of bowel obstruction. Appendix is not discretely visualized. No colonic wall thickening or inflammatory changes. Vascular/Lymphatic: No evidence of abdominal aortic aneurysm. No suspicious abdominopelvic lymphadenopathy. Reproductive: Prostate is grossly unremarkable. Penile prosthesis with reservoir in the right mid abdomen (series 2/image 49). Other: No abdominopelvic ascites. Musculoskeletal: Degenerative changes of the lumbar spine. IMPRESSION: Interval removal of prior left ureteral stent, now with mild left hydroureteronephrosis. Two nonobstructing left renal calculi measuring up to 3 mm. Electronically Signed   By: Julian Hy M.D.   On: 08/11/2022 19:16   DG Abd Portable 1V  Result Date: 08/11/2022 CLINICAL DATA:  Abdominal pain EXAM: PORTABLE ABDOMEN - 1 VIEW COMPARISON:  06/10/2022 FINDINGS: None bowel gas pattern is nonspecific. Upper abdomen is not included in its entirety. No abnormal masses or calcifications  are seen. Degenerative changes are noted in  lumbar spine. Last lumbar would raise transitional. IMPRESSION: Nonspecific bowel gas pattern. Electronically Signed   By: Elmer Picker M.D.   On: 08/11/2022 10:11   DG CHEST PORT 1 VIEW  Result Date: 08/11/2022 CLINICAL DATA:  [id number] Fluid overload [id number] EXAM: PORTABLE CHEST - 1 VIEW COMPARISON:  08/08/2022 FINDINGS: Cardiac silhouette is unremarkable. No pneumothorax or pleural effusion. The lungs are clear. The visualized skeletal structures are unremarkable. Patient is status post median sternotomy and CABG. IMPRESSION: No acute cardiopulmonary process. Electronically Signed   By: Sammie Bench M.D.   On: 08/11/2022 08:26    Anti-infectives: Anti-infectives (From admission, onward)    Start     Dose/Rate Route Frequency Ordered Stop   08/08/22 0745  ceFEPIme (MAXIPIME) 2 g in sodium chloride 0.9 % 100 mL IVPB        2 g 200 mL/hr over 30 Minutes Intravenous Every 8 hours 08/08/22 0725 08/11/22 2158   08/08/22 0545  piperacillin-tazobactam (ZOSYN) IVPB 3.375 g        3.375 g 100 mL/hr over 30 Minutes Intravenous  Once 08/08/22 0540 08/08/22 0654       Assessment/Plan:  No surgical indications. His left flank pain is from transient ureteral edema which will resolve with time. Continue current meds. OK for DC at anytime from Urol perspective. He has f/u with [place] arranged.  Appreciate hospitalist team comanagment.    Alexis Frock 08/12/2022

## 2022-08-12 NOTE — Progress Notes (Signed)
PROGRESS NOTE  Danny Brewer [id number] DOB: Dec 30, 1970 DOA: 08/08/2022 PCP: Lurline Del, DO  HPI/Recap of past 24 hours: Danny Brewer is a 52 y.o. male with medical history significant for BPH, CAD s/p CABG in 2020, GERD, history of kidney stones, HTN, DM2 presented to the ED with complaints of hemoptysis, chest discomfort after cystoscopy and stent placement for obstructing renal stone on 3/1. Cystoscopy procedure went well, however afterwards he was having little bit of trouble breathing, he remembers being given multiple breathing treatments, and stayed in PACU for several hours until he was stabilized.  He recalls having an episode of hemoptysis while in the postprocedure area, eventually was discharged home on room air. Shortly after d/c noted hemoptysis, with pleuritic chest pain. In the ER, he was saturating well on room air, lab work was relatively unremarkable, had 2 negative troponins,. Patient admitted for further management.  Continues to have issues with medications and pain-- did not tolerate valium   Assessment/Plan: Principal Problem:   Hemoptysis Active Problems:   GERD (gastroesophageal reflux disease)   Coronary artery disease   Essential hypertension   Hyperlipidemia   Type 2 diabetes mellitus without complication, without long-term current use of insulin (HCC)   S/P cystoscopy with ureteral stent placement   Multifocal pneumonia versus aspiration pneumonia ??Pulm edema ??Patient was placed under general anesthesia by LMA insertion during procedure Currently afebrile, with no leukocytosis, currently on room air Respiratory panel negative Procalcitonin 0.25-->0.12 CTA chest negative for PE, noted diffuse scattered groundglass opacities in the lungs bilaterally with a central predominance possible edema versus multifocal pneumonia Strep urine antigen negative S/p abx  CAD s/p CABG in 2020 Hyperlipidemia Still reporting some related musculoskeletal chest  pain, has a follow-up with cardiology to further evaluate Otherwise denies any left-sided chest pain Troponins negative, EKG with no acute ST changes  Hypertension Continue amlodipine  Diabetes mellitus type 2 Last A1c 6.2 SSI, Accu-Cheks, hypoglycemic protocol  GERD Continue PPI  S/p cystoscopy with ureteral stent placement Continue Flomax -pyridium  Constipation -bowel regimen        Code Status: Full  Family Communication: wif eon phone  Disposition Plan: Status is: Inpatient Remains inpatient appropriate because: Level of care      Consultants: urology  DVT prophylaxis: SCDs   Objective: Vitals:   08/11/22 2024 08/12/22 0459 08/12/22 1024 08/12/22 1100  BP: 112/75 122/86 (!) 142/91 (!) 130/93  Pulse: 96 (!) 101  (!) 102  Resp: '16 20  20  '$ Temp: 97.8 F (36.6 C) 98.5 F (36.9 C)  98.3 F (36.8 C)  TempSrc: Oral Oral  Oral  SpO2: 97% 97%    Weight:      Height:        Intake/Output Summary (Last 24 hours) at 08/12/2022 1254 Last data filed at 08/12/2022 0900 Gross per 24 hour  Intake 640 ml  Output 400 ml  Net 240 ml   Filed Weights   08/08/22 0227  Weight: 87.1 kg    Exam:  General: Appearance:     Overweight male in no acute distress     Lungs:      respirations unlabored  Heart:    Tachycardic.   MS:   All extremities are intact.   Neurologic:   Awake, alert     Data Reviewed: CBC: Recent Labs  Lab 08/08/22 0251 08/08/22 0259 08/08/22 0758 08/09/22 0507 08/10/22 0445 08/11/22 0514 08/12/22 0510  WBC 8.7  --   --  10.5 10.7* 11.5* 9.9  HGB 11.7* 12.9*  --  12.3* 13.5 13.6 13.1  HCT 38.3* 38.0*  --  38.8* 42.9 42.6 41.8  MCV 90.3  --   --  88.8 88.3 87.5 87.4  PLT 30*  --  200 196 195 213 [id number]   Basic Metabolic Panel: Recent Labs  Lab 08/08/22 0345 08/09/22 0507 08/10/22 0445 08/11/22 0514 08/12/22 0510  NA 136 138 136 131* 135  K 3.9 4.0 3.9 3.9 4.0  CL 106 111 102 101 104  CO2 '22 22 24 24 24  '$ GLUCOSE 118*  106* 107* 116* 105*  BUN 22* 21* '16 20 19  '$ CREATININE 1.47* 1.33* 1.22 1.63* 1.36*  CALCIUM 8.4* 8.2* 8.5* 8.1* 8.5*   GFR: Estimated Creatinine Clearance: 68.7 mL/min (A) (by C-G formula based on SCr of 1.36 mg/dL (H)). Liver Function Tests: Recent Labs  Lab 08/08/22 0345  AST 19  ALT 14  ALKPHOS 46  BILITOT 0.4  PROT 6.2*  ALBUMIN 3.3*   No results for input(s): "LIPASE", "AMYLASE" in the last 168 hours. No results for input(s): "AMMONIA" in the last 168 hours. Coagulation Profile: Recent Labs  Lab 08/08/22 0758  INR 1.1   Cardiac Enzymes: No results for input(s): "CKTOTAL", "CKMB", "CKMBINDEX", "TROPONINI" in the last 168 hours. BNP (last 3 results) No results for input(s): "PROBNP" in the last 8760 hours. HbA1C: No results for input(s): "HGBA1C" in the last 72 hours. CBG: Recent Labs  Lab 08/11/22 1120 08/11/22 1703 08/11/22 2022 08/12/22 0833 08/12/22 1205  GLUCAP 131* 111* 180* 99 97   Lipid Profile: No results for input(s): "CHOL", "HDL", "LDLCALC", "TRIG", "CHOLHDL", "LDLDIRECT" in the last 72 hours. Thyroid Function Tests: No results for input(s): "TSH", "T4TOTAL", "FREET4", "T3FREE", "THYROIDAB" in the last 72 hours. Anemia Panel: No results for input(s): "VITAMINB12", "FOLATE", "FERRITIN", "TIBC", "IRON", "RETICCTPCT" in the last 72 hours. Urine analysis:    Component Value Date/Time   COLORURINE COLORLESS (A) 08/08/2022 0540   APPEARANCEUR CLEAR 08/08/2022 0540   LABSPEC 1.011 08/08/2022 0540   PHURINE 6.0 08/08/2022 0540   GLUCOSEU NEGATIVE 08/08/2022 0540   HGBUR LARGE (A) 08/08/2022 0540   BILIRUBINUR NEGATIVE 08/08/2022 0540   KETONESUR NEGATIVE 08/08/2022 0540   PROTEINUR NEGATIVE 08/08/2022 0540   UROBILINOGEN 0.2 11/26/2014 1518   NITRITE NEGATIVE 08/08/2022 0540   LEUKOCYTESUR TRACE (A) 08/08/2022 0540     Recent Results (from the past 240 hour(s))  Culture, blood (routine x 2)     Status: None (Preliminary result)   Collection  Time: 08/08/22  6:25 AM   Specimen: BLOOD  Result Value Ref Range Status   Specimen Description   Final    BLOOD RIGHT ANTECUBITAL Performed at [facility], [place] [address] [place], [place] [address]    Special Requests   Final    BOTTLES DRAWN AEROBIC AND ANAEROBIC Blood Culture adequate volume Performed at [facility] [address], [place], [place] [address]    Culture   Final    NO GROWTH 4 DAYS Performed at [facility], [place] [address], [place], [place] [address]    Report Status PENDING  Incomplete  Respiratory (~20 pathogens) panel by PCR     Status: None   Collection Time: 08/08/22  7:59 AM   Specimen: Nasopharyngeal Swab; Respiratory  Result Value Ref Range Status   Adenovirus NOT DETECTED NOT DETECTED Final   Coronavirus 229E NOT DETECTED NOT DETECTED Final    Comment: (NOTE) The Coronavirus on the Respiratory Panel,  DOES NOT test for the novel  Coronavirus (2019 nCoV)    Coronavirus HKU1 NOT DETECTED NOT DETECTED Final   Coronavirus NL63 NOT DETECTED NOT DETECTED Final   Coronavirus OC43 NOT DETECTED NOT DETECTED Final   Metapneumovirus NOT DETECTED NOT DETECTED Final   Rhinovirus / Enterovirus NOT DETECTED NOT DETECTED Final   Influenza A NOT DETECTED NOT DETECTED Final   Influenza B NOT DETECTED NOT DETECTED Final   Parainfluenza Virus 1 NOT DETECTED NOT DETECTED Final   Parainfluenza Virus 2 NOT DETECTED NOT DETECTED Final   Parainfluenza Virus 3 NOT DETECTED NOT DETECTED Final   Parainfluenza Virus 4 NOT DETECTED NOT DETECTED Final   Respiratory Syncytial Virus NOT DETECTED NOT DETECTED Final   Bordetella pertussis NOT DETECTED NOT DETECTED Final   Bordetella Parapertussis NOT DETECTED NOT DETECTED Final   Chlamydophila pneumoniae NOT DETECTED NOT DETECTED Final   Mycoplasma pneumoniae NOT DETECTED NOT DETECTED Final    Comment: Performed at [facility], [place] [address] [place], [place] [address]   Culture, blood (routine x 2)     Status: None (Preliminary result)   Collection Time: 08/08/22  9:13 AM   Specimen: BLOOD  Result Value Ref Range Status   Specimen Description   Final    BLOOD BLOOD LEFT HAND AEROBIC BOTTLE ONLY Performed at [facility] [address], [place], [place] [address]    Special Requests   Final    BOTTLES DRAWN AEROBIC ONLY Blood Culture adequate volume Performed at [facility] [address], [place], [place] [address]    Culture   Final    NO GROWTH 4 DAYS Performed at [facility], [place] [address] [place], [place] [address]    Report Status PENDING  Incomplete  Resp panel by RT-PCR (RSV, Flu A&B, Covid) Anterior Nasal Swab     Status: None   Collection Time: 08/09/22  7:07 AM   Specimen: Anterior Nasal Swab  Result Value Ref Range Status   SARS Coronavirus 2 by RT PCR NEGATIVE NEGATIVE Final    Comment: (NOTE) SARS-CoV-2 target nucleic acids are NOT DETECTED.  The SARS-CoV-2 RNA is generally detectable in upper respiratory specimens during the acute phase of infection. The lowest concentration of SARS-CoV-2 viral copies this assay can detect is 138 copies/mL. A negative result does not preclude SARS-Cov-2 infection and should not be used as the sole basis for treatment or other patient management decisions. A negative result may occur with  improper specimen collection/handling, submission of specimen other than nasopharyngeal swab, presence of viral mutation(s) within the areas targeted by this assay, and inadequate number of viral copies(<138 copies/mL). A negative result must be combined with clinical observations, patient history, and epidemiological information. The expected result is Negative.  Fact Sheet for Patients:  EntrepreneurPulse.com.au  Fact Sheet for Healthcare Providers:  IncredibleEmployment.be  This test is no t yet approved or cleared by  the [place] FDA and  has been authorized for detection and/or diagnosis of SARS-CoV-2 by FDA under an Emergency Use Authorization (EUA). This EUA will remain  in effect (meaning this test can be used) for the duration of the COVID-19 declaration under Section 564(b)(1) of the Act, 21 U.S.C.section 360bbb-3(b)(1), unless the authorization is terminated  or revoked sooner.       Influenza A by PCR NEGATIVE NEGATIVE Final   Influenza B by PCR NEGATIVE NEGATIVE Final    Comment: (NOTE) The Xpert Xpress SARS-CoV-2/FLU/RSV plus assay is intended  as an aid in the diagnosis of influenza from Nasopharyngeal swab specimens and should not be used as a sole basis for treatment. Nasal washings and aspirates are unacceptable for Xpert Xpress SARS-CoV-2/FLU/RSV testing.  Fact Sheet for Patients: EntrepreneurPulse.com.au  Fact Sheet for Healthcare Providers: IncredibleEmployment.be  This test is not yet approved or cleared by the [place] FDA and has been authorized for detection and/or diagnosis of SARS-CoV-2 by FDA under an Emergency Use Authorization (EUA). This EUA will remain in effect (meaning this test can be used) for the duration of the COVID-19 declaration under Section 564(b)(1) of the Act, 21 U.S.C. section 360bbb-3(b)(1), unless the authorization is terminated or revoked.     Resp Syncytial Virus by PCR NEGATIVE NEGATIVE Final    Comment: (NOTE) Fact Sheet for Patients: EntrepreneurPulse.com.au  Fact Sheet for Healthcare Providers: IncredibleEmployment.be  This test is not yet approved or cleared by the [place] FDA and has been authorized for detection and/or diagnosis of SARS-CoV-2 by FDA under an Emergency Use Authorization (EUA). This EUA will remain in effect (meaning this test can be used) for the duration of the COVID-19 declaration under Section 564(b)(1) of the Act, 21  U.S.C. section 360bbb-3(b)(1), unless the authorization is terminated or revoked.  Performed at [facility], [place] [address] [place], [place] [address]       Studies: CT RENAL STONE STUDY  Result Date: 08/11/2022 CLINICAL DATA:  Left flank pain, ureteral stent removal EXAM: CT ABDOMEN AND PELVIS WITHOUT CONTRAST TECHNIQUE: Multidetector CT imaging of the abdomen and pelvis was performed following the standard protocol without IV contrast. RADIATION DOSE REDUCTION: This exam was performed according to the departmental dose-optimization program which includes automated exposure control, adjustment of the mA and/or kV according to patient size and/or use of iterative reconstruction technique. COMPARISON:  08/08/2022 FINDINGS: Lower chest: Mild bibasilar atelectasis. Hepatobiliary: Unenhanced liver is unremarkable. Gallbladder is unremarkable. No intrahepatic or extrahepatic duct dilatation. Pancreas: Within normal limits. Spleen: Within normal limits. Adrenals/Urinary Tract: Adrenal glands are within normal limits. Right kidney is within normal limits. 3 mm nonobstructing left upper pole renal calculus (series 2/image 31). Additional 2 mm nonobstructing lower pole renal calculus (series 2/image 34). Interval removal of prior left ureteral stent, now with mild left hydroureteronephrosis. Bladder is underdistended. Stomach/Bowel: Stomach is within normal limits. No evidence of bowel obstruction. Appendix is not discretely visualized. No colonic wall thickening or inflammatory changes. Vascular/Lymphatic: No evidence of abdominal aortic aneurysm. No suspicious abdominopelvic lymphadenopathy. Reproductive: Prostate is grossly unremarkable. Penile prosthesis with reservoir in the right mid abdomen (series 2/image 49). Other: No abdominopelvic ascites. Musculoskeletal: Degenerative changes of the lumbar spine. IMPRESSION: Interval removal of prior left ureteral stent, now with mild left  hydroureteronephrosis. Two nonobstructing left renal calculi measuring up to 3 mm. Electronically Signed   By: Julian Hy M.D.   On: 08/11/2022 19:16    Scheduled Meds:  amLODipine  10 mg Oral Daily   docusate sodium  100 mg Oral BID   insulin aspart  0-15 Units Subcutaneous TID WC   insulin aspart  0-5 Units Subcutaneous QHS   lidocaine  1 patch Transdermal Q24H   methocarbamol  500 mg Oral TID   pantoprazole  40 mg Oral Daily   phenazopyridine  200 mg Oral TID WC   polyethylene glycol  17 g Oral BID   senna-docusate  1 tablet Oral BID   simethicone  80 mg Oral Once   tamsulosin  0.4 mg Oral Daily  Continuous Infusions:     LOS: 4 days     Geradine Girt, DO [facility]  If 7PM-7AM, please contact night-coverage [email or web address] 08/12/2022, 12:54 PM

## 2022-08-13 LAB — CULTURE, BLOOD (ROUTINE X 2)
Culture: NO GROWTH
Culture: NO GROWTH
Special Requests: ADEQUATE
Special Requests: ADEQUATE

## 2022-08-13 LAB — GLUCOSE, CAPILLARY
Glucose-Capillary: 114 mg/dL — ABNORMAL HIGH (ref 70–99)
Glucose-Capillary: 91 mg/dL (ref 70–99)
Glucose-Capillary: 137 mg/dL — ABNORMAL HIGH (ref 70–99)
Glucose-Capillary: 101 mg/dL — ABNORMAL HIGH (ref 70–99)

## 2022-08-13 MED ORDER — SODIUM CHLORIDE 0.9 % IV SOLN: INTRAVENOUS | Status: DC

## 2022-08-13 MED ORDER — HYDROMORPHONE HCL 1 MG/ML IJ SOLN
0.2500 mg | Freq: Once | INTRAMUSCULAR | Status: AC
  Administered 2022-08-13: 0.25 mg via INTRAVENOUS
  Filled 2022-08-13: qty 0.5

## 2022-08-13 MED ORDER — SIMETHICONE 80 MG PO CHEW
80.0000 mg | CHEWABLE_TABLET | Freq: Four times a day (QID) | ORAL | Status: DC | PRN
  Administered 2022-08-13 (×2): 80 mg via ORAL
  Filled 2022-08-13 (×2): qty 1

## 2022-08-13 MED ORDER — DIAZEPAM 2 MG PO TABS
2.0000 mg | ORAL_TABLET | Freq: Four times a day (QID) | ORAL | Status: DC | PRN
  Administered 2022-08-13: 2 mg via ORAL
  Filled 2022-08-13: qty 1

## 2022-08-13 MED ORDER — DICLOFENAC SODIUM 1 % EX GEL
2.0000 g | Freq: Four times a day (QID) | CUTANEOUS | Status: DC
  Administered 2022-08-13 – 2022-08-15 (×6): 2 g via TOPICAL
  Filled 2022-08-13: qty 100

## 2022-08-13 MED ORDER — TRAMADOL HCL 50 MG PO TABS
50.0000 mg | ORAL_TABLET | Freq: Four times a day (QID) | ORAL | Status: DC | PRN
  Administered 2022-08-13 (×2): 50 mg via ORAL
  Filled 2022-08-13 (×2): qty 1

## 2022-08-13 NOTE — Progress Notes (Signed)
PROGRESS NOTE  Danny Brewer [id number] DOB: [birth year] DOA: 08/08/2022 PCP: Lurline Del, DO  HPI/Recap of past 24 hours: Danny Brewer is a 52 y.o. male with medical history significant for BPH, CAD s/p CABG in 2020, GERD, history of kidney stones, HTN, DM2 presented to the ED with complaints of hemoptysis, chest discomfort after cystoscopy and stent placement for obstructing renal stone on 3/1. Cystoscopy procedure went well, however afterwards he was having little bit of trouble breathing, he remembers being given multiple breathing treatments, and stayed in PACU for several hours until he was stabilized.  He recalls having an episode of hemoptysis while in the postprocedure area, eventually was discharged home on room air. Shortly after d/c noted hemoptysis, with pleuritic chest pain. In the ER, he was saturating well on room air, lab work was relatively unremarkable, had 2 negative troponins,. Patient admitted for further management.  Continues to have issues with medications and pain-- did not tolerate valium at 10 mg-- will try lower dose-- wife in agreement -c/o pain in lower abdomen-- described as a pressure as well as dull back pain  Assessment/Plan: Principal Problem:   Hemoptysis Active Problems:   GERD (gastroesophageal reflux disease)   Coronary artery disease   Essential hypertension   Hyperlipidemia   Type 2 diabetes mellitus without complication, without long-term current use of insulin (HCC)   S/P cystoscopy with ureteral stent placement   Multifocal pneumonia versus aspiration pneumonia ??Pulm edema ??Patient was placed under general anesthesia by LMA insertion during procedure Currently afebrile, with no leukocytosis, currently on room air Respiratory panel negative Procalcitonin 0.25-->0.12 CTA chest negative for PE, noted diffuse scattered groundglass opacities in the lungs bilaterally with a central predominance possible edema versus multifocal  pneumonia Strep urine antigen negative S/p abx  CAD s/p CABG in 2020 Hyperlipidemia Still reporting some related musculoskeletal chest pain, has a follow-up with cardiology to further evaluate Otherwise denies any left-sided chest pain Troponins negative, EKG with no acute ST changes  Hypertension Continue amlodipine  Diabetes mellitus type 2 Last A1c 6.2 SSI, Accu-Cheks, hypoglycemic protocol  GERD Continue PPI  S/p cystoscopy with ureteral stent placement Continue Flomax -pyridium -continues to to have abdominal pain-- will do lower dose of valium -tramadol  Constipation -bowel regimen -ambulate       Code Status: Full  Family Communication: wif eon phone  Disposition Plan: Status is: Inpatient Remains inpatient appropriate because: Level of care      Consultants: Urology (signed off)  DVT prophylaxis: SCDs   Objective: Vitals:   08/12/22 1024 08/12/22 1100 08/12/22 2010 08/13/22 0438  BP: (!) 142/91 (!) 130/93 104/72 (!) 153/100  Pulse:  (!) 102 95   Resp:  '20 18 20  '$ Temp:  98.3 F (36.8 C) 98.6 F (37 C) 98.4 F (36.9 C)  TempSrc:  Oral Oral Oral  SpO2:   97% 99%  Weight:      Height:        Intake/Output Summary (Last 24 hours) at 08/13/2022 1023 Last data filed at 08/13/2022 0400 Gross per 24 hour  Intake 480 ml  Output 300 ml  Net 180 ml   Filed Weights   08/08/22 0227  Weight: 87.1 kg    Exam:   General: Appearance:     Overweight male in no acute distress     Lungs:      respirations unlabored  Heart:    Normal heart rate. Normal rhythm. No murmurs, rubs, or gallops.   MS:   All  extremities are intact.   Neurologic:   Awake, alert     Data Reviewed: CBC: Recent Labs  Lab 08/08/22 0251 08/08/22 0259 08/08/22 0758 08/09/22 0507 08/10/22 0445 08/11/22 0514 08/12/22 0510  WBC 8.7  --   --  10.5 10.7* 11.5* 9.9  HGB 11.7* 12.9*  --  12.3* 13.5 13.6 13.1  HCT 38.3* 38.0*  --  38.8* 42.9 42.6 41.8  MCV 90.3  --    --  88.8 88.3 87.5 87.4  PLT 30*  --  200 196 195 213 [id number]   Basic Metabolic Panel: Recent Labs  Lab 08/08/22 0345 08/09/22 0507 08/10/22 0445 08/11/22 0514 08/12/22 0510  NA 136 138 136 131* 135  K 3.9 4.0 3.9 3.9 4.0  CL 106 111 102 101 104  CO2 '22 22 24 24 24  '$ GLUCOSE 118* 106* 107* 116* 105*  BUN 22* 21* '16 20 19  '$ CREATININE 1.47* 1.33* 1.22 1.63* 1.36*  CALCIUM 8.4* 8.2* 8.5* 8.1* 8.5*   GFR: Estimated Creatinine Clearance: 68.7 mL/min (A) (by C-G formula based on SCr of 1.36 mg/dL (H)). Liver Function Tests: Recent Labs  Lab 08/08/22 0345  AST 19  ALT 14  ALKPHOS 46  BILITOT 0.4  PROT 6.2*  ALBUMIN 3.3*   No results for input(s): "LIPASE", "AMYLASE" in the last 168 hours. No results for input(s): "AMMONIA" in the last 168 hours. Coagulation Profile: Recent Labs  Lab 08/08/22 0758  INR 1.1   Cardiac Enzymes: No results for input(s): "CKTOTAL", "CKMB", "CKMBINDEX", "TROPONINI" in the last 168 hours. BNP (last 3 results) No results for input(s): "PROBNP" in the last 8760 hours. HbA1C: No results for input(s): "HGBA1C" in the last 72 hours. CBG: Recent Labs  Lab 08/12/22 0833 08/12/22 1205 08/12/22 1653 08/12/22 2009 08/13/22 0732  GLUCAP 99 97 104* 131* 114*   Lipid Profile: No results for input(s): "CHOL", "HDL", "LDLCALC", "TRIG", "CHOLHDL", "LDLDIRECT" in the last 72 hours. Thyroid Function Tests: No results for input(s): "TSH", "T4TOTAL", "FREET4", "T3FREE", "THYROIDAB" in the last 72 hours. Anemia Panel: No results for input(s): "VITAMINB12", "FOLATE", "FERRITIN", "TIBC", "IRON", "RETICCTPCT" in the last 72 hours. Urine analysis:    Component Value Date/Time   COLORURINE COLORLESS (A) 08/08/2022 0540   APPEARANCEUR CLEAR 08/08/2022 0540   LABSPEC 1.011 08/08/2022 0540   PHURINE 6.0 08/08/2022 0540   GLUCOSEU NEGATIVE 08/08/2022 0540   HGBUR LARGE (A) 08/08/2022 0540   BILIRUBINUR NEGATIVE 08/08/2022 0540   KETONESUR NEGATIVE 08/08/2022  0540   PROTEINUR NEGATIVE 08/08/2022 0540   UROBILINOGEN 0.2 11/26/2014 1518   NITRITE NEGATIVE 08/08/2022 0540   LEUKOCYTESUR TRACE (A) 08/08/2022 0540     Recent Results (from the past 240 hour(s))  Culture, blood (routine x 2)     Status: None   Collection Time: 08/08/22  6:25 AM   Specimen: BLOOD  Result Value Ref Range Status   Specimen Description   Final    BLOOD RIGHT ANTECUBITAL Performed at [facility], [place] [address] [place], [place] [address]    Special Requests   Final    BOTTLES DRAWN AEROBIC AND ANAEROBIC Blood Culture adequate volume Performed at [place] [address] [place], [place] [address]    Culture   Final    NO GROWTH 5 DAYS Performed at [facility], [place] [address], [place], [place] [address]    Report Status 08/13/2022 FINAL  Final  Respiratory (~20 pathogens) panel by PCR     Status: None  Collection Time: 08/08/22  7:59 AM   Specimen: Nasopharyngeal Swab; Respiratory  Result Value Ref Range Status   Adenovirus NOT DETECTED NOT DETECTED Final   Coronavirus 229E NOT DETECTED NOT DETECTED Final    Comment: (NOTE) The Coronavirus on the Respiratory Panel, DOES NOT test for the novel  Coronavirus (2019 nCoV)    Coronavirus HKU1 NOT DETECTED NOT DETECTED Final   Coronavirus NL63 NOT DETECTED NOT DETECTED Final   Coronavirus OC43 NOT DETECTED NOT DETECTED Final   Metapneumovirus NOT DETECTED NOT DETECTED Final   Rhinovirus / Enterovirus NOT DETECTED NOT DETECTED Final   Influenza A NOT DETECTED NOT DETECTED Final   Influenza B NOT DETECTED NOT DETECTED Final   Parainfluenza Virus 1 NOT DETECTED NOT DETECTED Final   Parainfluenza Virus 2 NOT DETECTED NOT DETECTED Final   Parainfluenza Virus 3 NOT DETECTED NOT DETECTED Final   Parainfluenza Virus 4 NOT DETECTED NOT DETECTED Final   Respiratory Syncytial Virus NOT DETECTED NOT DETECTED Final   Bordetella pertussis NOT DETECTED NOT DETECTED Final    Bordetella Parapertussis NOT DETECTED NOT DETECTED Final   Chlamydophila pneumoniae NOT DETECTED NOT DETECTED Final   Mycoplasma pneumoniae NOT DETECTED NOT DETECTED Final    Comment: Performed at [facility], [place] [address] [place], [place] [address]  Culture, blood (routine x 2)     Status: None   Collection Time: 08/08/22  9:13 AM   Specimen: BLOOD  Result Value Ref Range Status   Specimen Description   Final    BLOOD BLOOD LEFT HAND AEROBIC BOTTLE ONLY Performed at [place] [address] [place], [place] [address]    Special Requests   Final    BOTTLES DRAWN AEROBIC ONLY Blood Culture adequate volume Performed at [place] [address], [place], [place] [address]    Culture   Final    NO GROWTH 5 DAYS Performed at [facility], [place] [address], [place], [place] [address]    Report Status 08/13/2022 FINAL  Final  Resp panel by RT-PCR (RSV, Flu A&B, Covid) Anterior Nasal Swab     Status: None   Collection Time: 08/09/22  7:07 AM   Specimen: Anterior Nasal Swab  Result Value Ref Range Status   SARS Coronavirus 2 by RT PCR NEGATIVE NEGATIVE Final    Comment: (NOTE) SARS-CoV-2 target nucleic acids are NOT DETECTED.  The SARS-CoV-2 RNA is generally detectable in upper respiratory specimens during the acute phase of infection. The lowest concentration of SARS-CoV-2 viral copies this assay can detect is 138 copies/mL. A negative result does not preclude SARS-Cov-2 infection and should not be used as the sole basis for treatment or other patient management decisions. A negative result may occur with  improper specimen collection/handling, submission of specimen other than nasopharyngeal swab, presence of viral mutation(s) within the areas targeted by this assay, and inadequate number of viral copies(<138 copies/mL). A negative result must be combined with clinical observations, patient history, and  epidemiological information. The expected result is Negative.  Fact Sheet for Patients:  EntrepreneurPulse.com.au  Fact Sheet for Healthcare Providers:  IncredibleEmployment.be  This test is no t yet approved or cleared by the [place] FDA and  has been authorized for detection and/or diagnosis of SARS-CoV-2 by FDA under an Emergency Use Authorization (EUA). This EUA will remain  in effect (meaning this test can be used) for the duration of the COVID-19 declaration under Section 564(b)(1) of the Act, 21 U.S.C.section  360bbb-3(b)(1), unless the authorization is terminated  or revoked sooner.       Influenza A by PCR NEGATIVE NEGATIVE Final   Influenza B by PCR NEGATIVE NEGATIVE Final    Comment: (NOTE) The Xpert Xpress SARS-CoV-2/FLU/RSV plus assay is intended as an aid in the diagnosis of influenza from Nasopharyngeal swab specimens and should not be used as a sole basis for treatment. Nasal washings and aspirates are unacceptable for Xpert Xpress SARS-CoV-2/FLU/RSV testing.  Fact Sheet for Patients: EntrepreneurPulse.com.au  Fact Sheet for Healthcare Providers: IncredibleEmployment.be  This test is not yet approved or cleared by the [place] FDA and has been authorized for detection and/or diagnosis of SARS-CoV-2 by FDA under an Emergency Use Authorization (EUA). This EUA will remain in effect (meaning this test can be used) for the duration of the COVID-19 declaration under Section 564(b)(1) of the Act, 21 U.S.C. section 360bbb-3(b)(1), unless the authorization is terminated or revoked.     Resp Syncytial Virus by PCR NEGATIVE NEGATIVE Final    Comment: (NOTE) Fact Sheet for Patients: EntrepreneurPulse.com.au  Fact Sheet for Healthcare Providers: IncredibleEmployment.be  This test is not yet approved or cleared by the [place] FDA and has been  authorized for detection and/or diagnosis of SARS-CoV-2 by FDA under an Emergency Use Authorization (EUA). This EUA will remain in effect (meaning this test can be used) for the duration of the COVID-19 declaration under Section 564(b)(1) of the Act, 21 U.S.C. section 360bbb-3(b)(1), unless the authorization is terminated or revoked.  Performed at [facility], [place] [address], [place], [place] [address]       Studies: DG Abd Portable 1V  Result Date: 08/12/2022 CLINICAL DATA:  Abdominal pain EXAM: PORTABLE ABDOMEN - 1 VIEW COMPARISON:  08/11/2022 FINDINGS: The bowel gas pattern is normal. No radio-opaque calculi or other significant radiographic abnormality are seen. Known tiny left renal stones are not definitively seen radiographically. Degenerative changes of the right hip. IMPRESSION: Negative. Electronically Signed   By: Davina Poke D.O.   On: 08/12/2022 13:20    Scheduled Meds:  amLODipine  10 mg Oral Daily   diclofenac Sodium  2 g Topical QID   docusate sodium  100 mg Oral BID   insulin aspart  0-15 Units Subcutaneous TID WC   insulin aspart  0-5 Units Subcutaneous QHS   pantoprazole  40 mg Oral Daily   phenazopyridine  200 mg Oral TID WC   polyethylene glycol  17 g Oral BID   senna-docusate  1 tablet Oral BID   tamsulosin  0.4 mg Oral Daily    Continuous Infusions:  sodium chloride        LOS: 5 days     Geradine Girt, DO [facility]  If 7PM-7AM, please contact night-coverage [email or web address] 08/13/2022, 10:23 AM

## 2022-08-14 LAB — GLUCOSE, CAPILLARY
Glucose-Capillary: 113 mg/dL — ABNORMAL HIGH (ref 70–99)
Glucose-Capillary: 100 mg/dL — ABNORMAL HIGH (ref 70–99)
Glucose-Capillary: 115 mg/dL — ABNORMAL HIGH (ref 70–99)
Glucose-Capillary: 139 mg/dL — ABNORMAL HIGH (ref 70–99)

## 2022-08-14 MED ORDER — DIAZEPAM 2 MG PO TABS
2.0000 mg | ORAL_TABLET | Freq: Two times a day (BID) | ORAL | Status: DC
  Administered 2022-08-14 – 2022-08-15 (×3): 2 mg via ORAL
  Filled 2022-08-14 (×3): qty 1

## 2022-08-14 MED ORDER — KETOROLAC TROMETHAMINE 10 MG PO TABS
10.0000 mg | ORAL_TABLET | Freq: Four times a day (QID) | ORAL | Status: DC
  Administered 2022-08-14 – 2022-08-15 (×4): 10 mg via ORAL
  Filled 2022-08-14 (×5): qty 1

## 2022-08-14 NOTE — Progress Notes (Signed)
PROGRESS NOTE  Danny Brewer [id number] DOB: Aug 25, 1970 DOA: 08/08/2022 PCP: Lurline Del, DO  HPI/Recap of past 24 hours: Danny Brewer is a 52 y.o. male with medical history significant for BPH, CAD s/p CABG in 2020, GERD, history of kidney stones, HTN, DM2 presented to the ED with complaints of hemoptysis, chest discomfort after cystoscopy and stent placement for obstructing renal stone on 3/1. Cystoscopy procedure went well, however afterwards he was having little bit of trouble breathing, he remembers being given multiple breathing treatments, and stayed in PACU for several hours until he was stabilized.  He recalls having an episode of hemoptysis while in the postprocedure area, eventually was discharged home on room air. Shortly after d/c noted hemoptysis, with pleuritic chest pain. In the ER, he was saturating well on room air, lab work was relatively unremarkable, had 2 negative troponins,. Patient admitted for further management.  Continues to have issues with medications and pain-- only got PRN valium once -spoke with urology-- will try scheduled valium and Toradol.   Assessment/Plan: Principal Problem:   Hemoptysis Active Problems:   GERD (gastroesophageal reflux disease)   Coronary artery disease   Essential hypertension   Hyperlipidemia   Type 2 diabetes mellitus without complication, without long-term current use of insulin (HCC)   S/P cystoscopy with ureteral stent placement   Multifocal pneumonia versus aspiration pneumonia ??Pulm edema ??Patient was placed under general anesthesia by LMA insertion during procedure Currently afebrile, with no leukocytosis, currently on room air Respiratory panel negative Procalcitonin 0.25-->0.12 CTA chest negative for PE, noted diffuse scattered groundglass opacities in the lungs bilaterally with a central predominance possible edema versus multifocal pneumonia Strep urine antigen negative S/p abx  CAD s/p CABG in  2020 Hyperlipidemia Still reporting some related musculoskeletal chest pain, has a follow-up with cardiology to further evaluate Otherwise denies any left-sided chest pain Troponins negative, EKG with no acute ST changes  Hypertension Continue amlodipine  Diabetes mellitus type 2 Last A1c 6.2 SSI, Accu-Cheks, hypoglycemic protocol  GERD Continue PPI  S/p cystoscopy with ureteral stent placement Continue Flomax -pyridium -continues to to have abdominal pain-- will do lower dose of valium- now schedule with scheduled toradol -tramadol PRN  Constipation -bowel regimen -ambulate       Code Status: Full  Family Communication: wif eon phone  Disposition Plan: Status is: Inpatient Remains inpatient appropriate because: Level of care      Consultants: Urology (signed off)  DVT prophylaxis: SCDs   Objective: Vitals:   08/13/22 1225 08/13/22 2029 08/14/22 0514 08/14/22 0514  BP: 124/87 (!) 139/94 126/87 126/87  Pulse: 84 82 94 92  Resp: 18 (!) '24 16 16  '$ Temp: 98.4 F (36.9 C) 98 F (36.7 C) (!) 97.4 F (36.3 C) (!) 97.4 F (36.3 C)  TempSrc: Oral Oral Oral Oral  SpO2: 98% 100% 97% 97%  Weight:      Height:        Intake/Output Summary (Last 24 hours) at 08/14/2022 1220 Last data filed at 08/14/2022 0900 Gross per 24 hour  Intake 2831.83 ml  Output 725 ml  Net 2106.83 ml   Filed Weights   08/08/22 0227  Weight: 87.1 kg    Exam:    General: Appearance:     Overweight male in no acute distress     Lungs:      respirations unlabored  Heart:    Normal heart rate.   MS:   All extremities are intact.   Neurologic:   Awake, alert  Data Reviewed: CBC: Recent Labs  Lab 08/08/22 0251 08/08/22 0259 08/08/22 0758 08/09/22 0507 08/10/22 0445 08/11/22 0514 08/12/22 0510  WBC 8.7  --   --  10.5 10.7* 11.5* 9.9  HGB 11.7* 12.9*  --  12.3* 13.5 13.6 13.1  HCT 38.3* 38.0*  --  38.8* 42.9 42.6 41.8  MCV 90.3  --   --  88.8 88.3 87.5 87.4   PLT 30*  --  200 196 195 213 [id number]   Basic Metabolic Panel: Recent Labs  Lab 08/08/22 0345 08/09/22 0507 08/10/22 0445 08/11/22 0514 08/12/22 0510  NA 136 138 136 131* 135  K 3.9 4.0 3.9 3.9 4.0  CL 106 111 102 101 104  CO2 '22 22 24 24 24  '$ GLUCOSE 118* 106* 107* 116* 105*  BUN 22* 21* '16 20 19  '$ CREATININE 1.47* 1.33* 1.22 1.63* 1.36*  CALCIUM 8.4* 8.2* 8.5* 8.1* 8.5*   GFR: Estimated Creatinine Clearance: 68.7 mL/min (A) (by C-G formula based on SCr of 1.36 mg/dL (H)). Liver Function Tests: Recent Labs  Lab 08/08/22 0345  AST 19  ALT 14  ALKPHOS 46  BILITOT 0.4  PROT 6.2*  ALBUMIN 3.3*   No results for input(s): "LIPASE", "AMYLASE" in the last 168 hours. No results for input(s): "AMMONIA" in the last 168 hours. Coagulation Profile: Recent Labs  Lab 08/08/22 0758  INR 1.1   Cardiac Enzymes: No results for input(s): "CKTOTAL", "CKMB", "CKMBINDEX", "TROPONINI" in the last 168 hours. BNP (last 3 results) No results for input(s): "PROBNP" in the last 8760 hours. HbA1C: No results for input(s): "HGBA1C" in the last 72 hours. CBG: Recent Labs  Lab 08/13/22 1224 08/13/22 1705 08/13/22 2025 08/14/22 0728 08/14/22 1125  GLUCAP 91 137* 101* 113* 100*   Lipid Profile: No results for input(s): "CHOL", "HDL", "LDLCALC", "TRIG", "CHOLHDL", "LDLDIRECT" in the last 72 hours. Thyroid Function Tests: No results for input(s): "TSH", "T4TOTAL", "FREET4", "T3FREE", "THYROIDAB" in the last 72 hours. Anemia Panel: No results for input(s): "VITAMINB12", "FOLATE", "FERRITIN", "TIBC", "IRON", "RETICCTPCT" in the last 72 hours. Urine analysis:    Component Value Date/Time   COLORURINE COLORLESS (A) 08/08/2022 0540   APPEARANCEUR CLEAR 08/08/2022 0540   LABSPEC 1.011 08/08/2022 0540   PHURINE 6.0 08/08/2022 0540   GLUCOSEU NEGATIVE 08/08/2022 0540   HGBUR LARGE (A) 08/08/2022 0540   BILIRUBINUR NEGATIVE 08/08/2022 0540   KETONESUR NEGATIVE 08/08/2022 0540   PROTEINUR  NEGATIVE 08/08/2022 0540   UROBILINOGEN 0.2 11/26/2014 1518   NITRITE NEGATIVE 08/08/2022 0540   LEUKOCYTESUR TRACE (A) 08/08/2022 0540     Recent Results (from the past 240 hour(s))  Culture, blood (routine x 2)     Status: None   Collection Time: 08/08/22  6:25 AM   Specimen: BLOOD  Result Value Ref Range Status   Specimen Description   Final    BLOOD RIGHT ANTECUBITAL Performed at [facility], [place] [address] [place], [place] [address]    Special Requests   Final    BOTTLES DRAWN AEROBIC AND ANAEROBIC Blood Culture adequate volume Performed at [place] [address], [place], [place] [address]    Culture   Final    NO GROWTH 5 DAYS Performed at [facility], [place] [address] [place], [place] [address]    Report Status 08/13/2022 FINAL  Final  Respiratory (~20 pathogens) panel by PCR     Status: None   Collection Time: 08/08/22  7:59 AM   Specimen: Nasopharyngeal Swab; Respiratory  Result Value Ref Range Status   Adenovirus NOT DETECTED NOT DETECTED Final   Coronavirus 229E NOT DETECTED NOT DETECTED Final    Comment: (NOTE) The Coronavirus on the Respiratory Panel, DOES NOT test for the novel  Coronavirus (2019 nCoV)    Coronavirus HKU1 NOT DETECTED NOT DETECTED Final   Coronavirus NL63 NOT DETECTED NOT DETECTED Final   Coronavirus OC43 NOT DETECTED NOT DETECTED Final   Metapneumovirus NOT DETECTED NOT DETECTED Final   Rhinovirus / Enterovirus NOT DETECTED NOT DETECTED Final   Influenza A NOT DETECTED NOT DETECTED Final   Influenza B NOT DETECTED NOT DETECTED Final   Parainfluenza Virus 1 NOT DETECTED NOT DETECTED Final   Parainfluenza Virus 2 NOT DETECTED NOT DETECTED Final   Parainfluenza Virus 3 NOT DETECTED NOT DETECTED Final   Parainfluenza Virus 4 NOT DETECTED NOT DETECTED Final   Respiratory Syncytial Virus NOT DETECTED NOT DETECTED Final   Bordetella pertussis NOT DETECTED NOT DETECTED Final   Bordetella  Parapertussis NOT DETECTED NOT DETECTED Final   Chlamydophila pneumoniae NOT DETECTED NOT DETECTED Final   Mycoplasma pneumoniae NOT DETECTED NOT DETECTED Final    Comment: Performed at [facility], [place] [address], [place], [place] [address]  Culture, blood (routine x 2)     Status: None   Collection Time: 08/08/22  9:13 AM   Specimen: BLOOD  Result Value Ref Range Status   Specimen Description   Final    BLOOD BLOOD LEFT HAND AEROBIC BOTTLE ONLY Performed at [facility] [address], [place], [place] [address]    Special Requests   Final    BOTTLES DRAWN AEROBIC ONLY Blood Culture adequate volume Performed at [place] [address] [place], [place] [address]    Culture   Final    NO GROWTH 5 DAYS Performed at [facility], [place] [address], [place],  [address]    Report Status 08/13/2022 FINAL  Final  Resp panel by RT-PCR (RSV, Flu A&B, Covid) Anterior Nasal Swab     Status: None   Collection Time: 08/09/22  7:07 AM   Specimen: Anterior Nasal Swab  Result Value Ref Range Status   SARS Coronavirus 2 by RT PCR NEGATIVE NEGATIVE Final    Comment: (NOTE) SARS-CoV-2 target nucleic acids are NOT DETECTED.  The SARS-CoV-2 RNA is generally detectable in upper respiratory specimens during the acute phase of infection. The lowest concentration of SARS-CoV-2 viral copies this assay can detect is 138 copies/mL. A negative result does not preclude SARS-Cov-2 infection and should not be used as the sole basis for treatment or other patient management decisions. A negative result may occur with  improper specimen collection/handling, submission of specimen other than nasopharyngeal swab, presence of viral mutation(s) within the areas targeted by this assay, and inadequate number of viral copies(<138 copies/mL). A negative result must be combined with clinical observations, patient history, and epidemiological information.  The expected result is Negative.  Fact Sheet for Patients:  EntrepreneurPulse.com.au  Fact Sheet for Healthcare Providers:  IncredibleEmployment.be  This test is no t yet approved or cleared by the [place] FDA and  has been authorized for detection and/or diagnosis of SARS-CoV-2 by FDA under an Emergency Use Authorization (EUA). This EUA will remain  in effect (meaning this test can be used) for the duration of the COVID-19 declaration under Section 564(b)(1) of the Act, 21 U.S.C.section 360bbb-3(b)(1), unless the authorization is terminated  or revoked sooner.  Influenza A by PCR NEGATIVE NEGATIVE Final   Influenza B by PCR NEGATIVE NEGATIVE Final    Comment: (NOTE) The Xpert Xpress SARS-CoV-2/FLU/RSV plus assay is intended as an aid in the diagnosis of influenza from Nasopharyngeal swab specimens and should not be used as a sole basis for treatment. Nasal washings and aspirates are unacceptable for Xpert Xpress SARS-CoV-2/FLU/RSV testing.  Fact Sheet for Patients: EntrepreneurPulse.com.au  Fact Sheet for Healthcare Providers: IncredibleEmployment.be  This test is not yet approved or cleared by the [place] FDA and has been authorized for detection and/or diagnosis of SARS-CoV-2 by FDA under an Emergency Use Authorization (EUA). This EUA will remain in effect (meaning this test can be used) for the duration of the COVID-19 declaration under Section 564(b)(1) of the Act, 21 U.S.C. section 360bbb-3(b)(1), unless the authorization is terminated or revoked.     Resp Syncytial Virus by PCR NEGATIVE NEGATIVE Final    Comment: (NOTE) Fact Sheet for Patients: EntrepreneurPulse.com.au  Fact Sheet for Healthcare Providers: IncredibleEmployment.be  This test is not yet approved or cleared by the [place] FDA and has been authorized for detection and/or  diagnosis of SARS-CoV-2 by FDA under an Emergency Use Authorization (EUA). This EUA will remain in effect (meaning this test can be used) for the duration of the COVID-19 declaration under Section 564(b)(1) of the Act, 21 U.S.C. section 360bbb-3(b)(1), unless the authorization is terminated or revoked.  Performed at [facility], [place] [address], [place], [place] [address]       Studies: No results found.  Scheduled Meds:  amLODipine  10 mg Oral Daily   diazepam  2 mg Oral Q12H   diclofenac Sodium  2 g Topical QID   docusate sodium  100 mg Oral BID   insulin aspart  0-15 Units Subcutaneous TID WC   insulin aspart  0-5 Units Subcutaneous QHS   ketorolac  10 mg Oral Q6H   pantoprazole  40 mg Oral Daily   phenazopyridine  200 mg Oral TID WC   polyethylene glycol  17 g Oral BID   senna-docusate  1 tablet Oral BID   tamsulosin  0.4 mg Oral Daily    Continuous Infusions:      LOS: 6 days     Geradine Girt, DO [facility]  If 7PM-7AM, please contact night-coverage [email or web address] 08/14/2022, 12:20 PM

## 2022-08-15 LAB — BASIC METABOLIC PANEL
Anion gap: 9 (ref 5–15)
BUN: 15 mg/dL (ref 6–20)
CO2: 24 mmol/L (ref 22–32)
Calcium: 8.3 mg/dL — ABNORMAL LOW (ref 8.9–10.3)
Chloride: 102 mmol/L (ref 98–111)
Creatinine, Ser: 1.36 mg/dL — ABNORMAL HIGH (ref 0.61–1.24)
GFR, Estimated: >60 mL/min (ref >60)
Glucose, Bld: 110 mg/dL — ABNORMAL HIGH (ref 70–99)
Potassium: 3.7 mmol/L (ref 3.5–5.1)
Sodium: 135 mmol/L (ref 135–145)

## 2022-08-15 LAB — GLUCOSE, CAPILLARY: Glucose-Capillary: 100 mg/dL — ABNORMAL HIGH (ref 70–99)

## 2022-08-15 MED ORDER — KETOROLAC TROMETHAMINE 10 MG PO TABS: ORAL_TABLET | ORAL | 0 refills | Status: DC

## 2022-08-15 MED ORDER — DIAZEPAM 2 MG PO TABS: 2.0000 mg | ORAL_TABLET | Freq: Two times a day (BID) | ORAL | 0 refills | Status: DC

## 2022-08-15 MED ORDER — DICLOFENAC SODIUM 1 % EX GEL: 2.0000 g | Freq: Four times a day (QID) | CUTANEOUS | 0 refills | Status: DC

## 2022-08-15 NOTE — Discharge Summary (Signed)
Physician Discharge Summary  Danny Brewer [id number] DOB: January 25, 1971 DOA: 08/08/2022  PCP: Lurline Del, DO  Admit date: 08/08/2022 Discharge date: 08/15/2022  Admitted From: home Discharge disposition: home   Recommendations for Outpatient Follow-Up:   Urology outpatient follow up BMP 1-2 weeks Bowel regimen    Discharge Diagnosis:   Principal Problem:   Hemoptysis Active Problems:   GERD (gastroesophageal reflux disease)   Coronary artery disease   Essential hypertension   Hyperlipidemia   Type 2 diabetes mellitus without complication, without long-term current use of insulin (HCC)   S/P cystoscopy with ureteral stent placement    Discharge Condition: Improved.  Diet recommendation: Low sodium, heart healthy.  Carbohydrate-modified.  Wound care: None.  Code status: Full.   History of Present Illness:   Danny Brewer is a 52 y.o. male with medical history significant for BPH, coronary artery disease, GERD, history of kidney stones, hypertension, non-insulin-dependent type 2 diabetes be admitted to the hospital with complaints of hemoptysis, chest discomfort after cystoscopy and stent placement for obstructing renal stone yesterday 3/1.  History is provided by the patient and his wife, who states that his cystoscopy procedure went well, however afterwards he was having little bit of trouble breathing, he remembers being given multiple breathing treatments, and stayed in PACU for several hours until he was stabilized.  He recalls having an episode of hemoptysis while in the postprocedure area.  Otherwise did well, was discharged home on room air.  At about 2 in the morning, he started getting chest discomfort, coughing up some blood.  The chest discomfort is sharp, pleuritic with deep breaths.  Came to the ER for evaluation.    Hospital Course by Problem:   Multifocal pneumonia versus aspiration pneumonia ??Pulm edema ??Patient was placed under general  anesthesia by LMA insertion during procedure Currently afebrile, with no leukocytosis, currently on room air Respiratory panel negative Procalcitonin 0.25-->0.12 CTA chest negative for PE, noted diffuse scattered groundglass opacities in the lungs bilaterally with a central predominance possible edema versus multifocal pneumonia Strep urine antigen negative S/p abx   CAD s/p CABG in 2020 Hyperlipidemia Still reporting some related musculoskeletal chest pain, has a follow-up with cardiology to further evaluate Otherwise denies any left-sided chest pain Troponins negative, EKG with no acute ST changes   Hypertension Continue amlodipine   Diabetes mellitus type 2 Last A1c 6.2 SSI, Accu-Cheks, hypoglycemic protocol   GERD Continue PPI   S/p cystoscopy with ureteral stent placement Continue Flomax -pain much improved on valium and toradol   Constipation -bowel regimen -ambulate      Medical Consultants:   urology   Discharge Exam:   Vitals:   08/14/22 2035 08/15/22 0444  BP: 109/75 (!) 123/90  Pulse: 85 84  Resp: 18   Temp: 98.4 F (36.9 C) 98.1 F (36.7 C)  SpO2: 96% 99%   Vitals:   08/14/22 0514 08/14/22 1442 08/14/22 2035 08/15/22 0444  BP: 126/87 109/71 109/75 (!) 123/90  Pulse: 92 90 85 84  Resp: '16 16 18   '$ Temp: (!) 97.4 F (36.3 C) 98 F (36.7 C) 98.4 F (36.9 C) 98.1 F (36.7 C)  TempSrc: Oral Oral Oral Oral  SpO2: 97% 97% 96% 99%  Weight:      Height:        General exam: Appears calm and comfortable.    The results of significant diagnostics from this hospitalization (including imaging, microbiology, ancillary and laboratory) are listed below for reference.  Procedures and Diagnostic Studies:   CT Angio Chest PE W/Cm &/Or Wo Cm  Result Date: 08/08/2022 CLINICAL DATA:  Postop abdominal pain. Pulmonary embolism suspected, high probability. Hemoptysis for 2 hours. EXAM: CT ANGIOGRAPHY CHEST CT ABDOMEN AND PELVIS WITH CONTRAST  TECHNIQUE: Multidetector CT imaging of the chest was performed using the standard protocol during bolus administration of intravenous contrast. Multiplanar CT image reconstructions and MIPs were obtained to evaluate the vascular anatomy. Multidetector CT imaging of the abdomen and pelvis was performed using the standard protocol during bolus administration of intravenous contrast. RADIATION DOSE REDUCTION: This exam was performed according to the departmental dose-optimization program which includes automated exposure control, adjustment of the mA and/or kV according to patient size and/or use of iterative reconstruction technique. CONTRAST:  156m OMNIPAQUE IOHEXOL 350 MG/ML SOLN COMPARISON:  05/28/2022. FINDINGS: CTA CHEST FINDINGS Cardiovascular: The heart is normal in size and there is no pericardial effusion. Scattered coronary artery calcifications are noted. The aorta and pulmonary trunk are normal in caliber. No pulmonary artery filling defect is seen. Mediastinum/Nodes: No enlarged mediastinal, hilar, or axillary lymph nodes. Thyroid gland, trachea, and esophagus demonstrate no significant findings. Lungs/Pleura: Scattered ground-glass opacities are noted in the lungs bilaterally with a central predominance. No effusion or pneumothorax. Musculoskeletal: Sternotomy wires are noted. No acute osseous abnormality. Review of the MIP images confirms the above findings. CT ABDOMEN and PELVIS FINDINGS Hepatobiliary: No focal liver abnormality is seen. No gallstones, gallbladder wall thickening, or biliary dilatation. Pancreas: Unremarkable. No pancreatic ductal dilatation or surrounding inflammatory changes. Spleen: Normal in size without focal abnormality. Adrenals/Urinary Tract: No adrenal nodule or mass. The kidneys enhance symmetrically. A few punctate renal calculi are noted bilaterally. A left ureteral stent is in place and terminates in the prostate gland. No ureteral calculus. Small focus of air is noted in  the urinary bladder which may be iatrogenic. Stomach/Bowel: Stomach is within normal limits. Appendix appears normal. No evidence of bowel wall thickening, distention, or inflammatory changes. No free air or pneumatosis. Vascular/Lymphatic: No significant vascular findings are present. No enlarged abdominal or pelvic lymph nodes. Reproductive: The prostate gland is normal in size. A penile prosthesis is noted with reservoir in the right lower quadrant. Other: No abdominopelvic ascites. Musculoskeletal: Degenerative changes in the lumbar spine. No acute osseous abnormality. Review of the MIP images confirms the above findings. IMPRESSION: 1. No evidence of pulmonary embolism. 2. Diffuse scattered ground-glass opacities in the lungs bilaterally with a central predominance, possible edema versus multifocal pneumonia. 3. No acute process in the abdomen and pelvis. 4. Punctate renal calculi bilaterally with no obstructive uropathy. A left ureteral stent is in place which terminates in the prostate gland. 5. Remaining incidental findings as described above. Electronically Signed   By: LBrett FairyM.D.   On: 08/08/2022 04:36   CT Abdomen Pelvis W Contrast  Result Date: 08/08/2022 CLINICAL DATA:  Postop abdominal pain. Pulmonary embolism suspected, high probability. Hemoptysis for 2 hours. EXAM: CT ANGIOGRAPHY CHEST CT ABDOMEN AND PELVIS WITH CONTRAST TECHNIQUE: Multidetector CT imaging of the chest was performed using the standard protocol during bolus administration of intravenous contrast. Multiplanar CT image reconstructions and MIPs were obtained to evaluate the vascular anatomy. Multidetector CT imaging of the abdomen and pelvis was performed using the standard protocol during bolus administration of intravenous contrast. RADIATION DOSE REDUCTION: This exam was performed according to the departmental dose-optimization program which includes automated exposure control, adjustment of the mA and/or kV according to  patient size and/or use of  iterative reconstruction technique. CONTRAST:  179m OMNIPAQUE IOHEXOL 350 MG/ML SOLN COMPARISON:  05/28/2022. FINDINGS: CTA CHEST FINDINGS Cardiovascular: The heart is normal in size and there is no pericardial effusion. Scattered coronary artery calcifications are noted. The aorta and pulmonary trunk are normal in caliber. No pulmonary artery filling defect is seen. Mediastinum/Nodes: No enlarged mediastinal, hilar, or axillary lymph nodes. Thyroid gland, trachea, and esophagus demonstrate no significant findings. Lungs/Pleura: Scattered ground-glass opacities are noted in the lungs bilaterally with a central predominance. No effusion or pneumothorax. Musculoskeletal: Sternotomy wires are noted. No acute osseous abnormality. Review of the MIP images confirms the above findings. CT ABDOMEN and PELVIS FINDINGS Hepatobiliary: No focal liver abnormality is seen. No gallstones, gallbladder wall thickening, or biliary dilatation. Pancreas: Unremarkable. No pancreatic ductal dilatation or surrounding inflammatory changes. Spleen: Normal in size without focal abnormality. Adrenals/Urinary Tract: No adrenal nodule or mass. The kidneys enhance symmetrically. A few punctate renal calculi are noted bilaterally. A left ureteral stent is in place and terminates in the prostate gland. No ureteral calculus. Small focus of air is noted in the urinary bladder which may be iatrogenic. Stomach/Bowel: Stomach is within normal limits. Appendix appears normal. No evidence of bowel wall thickening, distention, or inflammatory changes. No free air or pneumatosis. Vascular/Lymphatic: No significant vascular findings are present. No enlarged abdominal or pelvic lymph nodes. Reproductive: The prostate gland is normal in size. A penile prosthesis is noted with reservoir in the right lower quadrant. Other: No abdominopelvic ascites. Musculoskeletal: Degenerative changes in the lumbar spine. No acute osseous  abnormality. Review of the MIP images confirms the above findings. IMPRESSION: 1. No evidence of pulmonary embolism. 2. Diffuse scattered ground-glass opacities in the lungs bilaterally with a central predominance, possible edema versus multifocal pneumonia. 3. No acute process in the abdomen and pelvis. 4. Punctate renal calculi bilaterally with no obstructive uropathy. A left ureteral stent is in place which terminates in the prostate gland. 5. Remaining incidental findings as described above. Electronically Signed   By: LBrett FairyM.D.   On: 08/08/2022 04:36   DG Chest Port 1 View  Result Date: 08/08/2022 CLINICAL DATA:  Chest pain EXAM: PORTABLE CHEST 1 VIEW COMPARISON:  02/27/2022 FINDINGS: Cardiac shadow is stable. Postsurgical changes are again seen. Lungs are well aerated bilaterally. Central increased perihilar density is noted consistent with mild pulmonary edema. No sizable effusion is seen. No acute bony abnormality is noted. IMPRESSION: Mild increased perihilar density consistent with pulmonary edema. Electronically Signed   By: MInez CatalinaM.D.   On: 08/08/2022 03:06     Labs:   Basic Metabolic Panel: Recent Labs  Lab 08/09/22 0507 08/10/22 0445 08/11/22 0514 08/12/22 0510 08/15/22 0535  NA 138 136 131* 135 135  K 4.0 3.9 3.9 4.0 3.7  CL 111 102 101 104 102  CO2 '22 24 24 24 24  '$ GLUCOSE 106* 107* 116* 105* 110*  BUN 21* '16 20 19 15  '$ CREATININE 1.33* 1.22 1.63* 1.36* 1.36*  CALCIUM 8.2* 8.5* 8.1* 8.5* 8.3*   GFR Estimated Creatinine Clearance: 68.7 mL/min (A) (by C-G formula based on SCr of 1.36 mg/dL (H)). Liver Function Tests: No results for input(s): "AST", "ALT", "ALKPHOS", "BILITOT", "PROT", "ALBUMIN" in the last 168 hours. No results for input(s): "LIPASE", "AMYLASE" in the last 168 hours. No results for input(s): "AMMONIA" in the last 168 hours. Coagulation profile No results for input(s): "INR", "PROTIME" in the last 168 hours.  CBC: Recent Labs  Lab  08/09/22 0507 08/10/22 0445 08/11/22  [id number] 08/12/22 0510  WBC 10.5 10.7* 11.5* 9.9  HGB 12.3* 13.5 13.6 13.1  HCT 38.8* 42.9 42.6 41.8  MCV 88.8 88.3 87.5 87.4  PLT 196 195 213 217   Cardiac Enzymes: No results for input(s): "CKTOTAL", "CKMB", "CKMBINDEX", "TROPONINI" in the last 168 hours. BNP: Invalid input(s): "POCBNP" CBG: Recent Labs  Lab 08/14/22 0728 08/14/22 1125 08/14/22 1659 08/14/22 2041 08/15/22 0724  GLUCAP 113* 100* 115* 139* 100*   D-Dimer No results for input(s): "DDIMER" in the last 72 hours. Hgb A1c No results for input(s): "HGBA1C" in the last 72 hours. Lipid Profile No results for input(s): "CHOL", "HDL", "LDLCALC", "TRIG", "CHOLHDL", "LDLDIRECT" in the last 72 hours. Thyroid function studies No results for input(s): "TSH", "T4TOTAL", "T3FREE", "THYROIDAB" in the last 72 hours.  Invalid input(s): "FREET3" Anemia work up No results for input(s): "VITAMINB12", "FOLATE", "FERRITIN", "TIBC", "IRON", "RETICCTPCT" in the last 72 hours. Microbiology Recent Results (from the past 240 hour(s))  Culture, blood (routine x 2)     Status: None   Collection Time: 08/08/22  6:25 AM   Specimen: BLOOD  Result Value Ref Range Status   Specimen Description   Final    BLOOD RIGHT ANTECUBITAL Performed at [facility] [address], [place], [place] [address]    Special Requests   Final    BOTTLES DRAWN AEROBIC AND ANAEROBIC Blood Culture adequate volume Performed at [facility] [address] [place], [place] [address]    Culture   Final    NO GROWTH 5 DAYS Performed at [facility], [facility] [address], [place], [place] [address]    Report Status 08/13/2022 FINAL  Final  Respiratory (~20 pathogens) panel by PCR     Status: None   Collection Time: 08/08/22  7:59 AM   Specimen: Nasopharyngeal Swab; Respiratory  Result Value Ref Range Status   Adenovirus NOT DETECTED NOT DETECTED Final   Coronavirus 229E NOT  DETECTED NOT DETECTED Final    Comment: (NOTE) The Coronavirus on the Respiratory Panel, DOES NOT test for the novel  Coronavirus (2019 nCoV)    Coronavirus HKU1 NOT DETECTED NOT DETECTED Final   Coronavirus NL63 NOT DETECTED NOT DETECTED Final   Coronavirus OC43 NOT DETECTED NOT DETECTED Final   Metapneumovirus NOT DETECTED NOT DETECTED Final   Rhinovirus / Enterovirus NOT DETECTED NOT DETECTED Final   Influenza A NOT DETECTED NOT DETECTED Final   Influenza B NOT DETECTED NOT DETECTED Final   Parainfluenza Virus 1 NOT DETECTED NOT DETECTED Final   Parainfluenza Virus 2 NOT DETECTED NOT DETECTED Final   Parainfluenza Virus 3 NOT DETECTED NOT DETECTED Final   Parainfluenza Virus 4 NOT DETECTED NOT DETECTED Final   Respiratory Syncytial Virus NOT DETECTED NOT DETECTED Final   Bordetella pertussis NOT DETECTED NOT DETECTED Final   Bordetella Parapertussis NOT DETECTED NOT DETECTED Final   Chlamydophila pneumoniae NOT DETECTED NOT DETECTED Final   Mycoplasma pneumoniae NOT DETECTED NOT DETECTED Final    Comment: Performed at [facility], [place] [address], [place], [place] [address]  Culture, blood (routine x 2)     Status: None   Collection Time: 08/08/22  9:13 AM   Specimen: BLOOD  Result Value Ref Range Status   Specimen Description   Final    BLOOD BLOOD LEFT HAND AEROBIC BOTTLE ONLY Performed at [facility] [address], [place], [place] [address]    Special Requests   Final    BOTTLES  DRAWN AEROBIC ONLY Blood Culture adequate volume Performed at [facility] [address], [place], [place] [address]    Culture   Final    NO GROWTH 5 DAYS Performed at [facility], [place] [address], [place], [place] [address]    Report Status 08/13/2022 FINAL  Final  Resp panel by RT-PCR (RSV, Flu A&B, Covid) Anterior Nasal Swab     Status: None   Collection Time: 08/09/22  7:07 AM   Specimen: Anterior Nasal Swab  Result Value Ref Range  Status   SARS Coronavirus 2 by RT PCR NEGATIVE NEGATIVE Final    Comment: (NOTE) SARS-CoV-2 target nucleic acids are NOT DETECTED.  The SARS-CoV-2 RNA is generally detectable in upper respiratory specimens during the acute phase of infection. The lowest concentration of SARS-CoV-2 viral copies this assay can detect is 138 copies/mL. A negative result does not preclude SARS-Cov-2 infection and should not be used as the sole basis for treatment or other patient management decisions. A negative result may occur with  improper specimen collection/handling, submission of specimen other than nasopharyngeal swab, presence of viral mutation(s) within the areas targeted by this assay, and inadequate number of viral copies(<138 copies/mL). A negative result must be combined with clinical observations, patient history, and epidemiological information. The expected result is Negative.  Fact Sheet for Patients:  EntrepreneurPulse.com.au  Fact Sheet for Healthcare Providers:  IncredibleEmployment.be  This test is no t yet approved or cleared by the [place] FDA and  has been authorized for detection and/or diagnosis of SARS-CoV-2 by FDA under an Emergency Use Authorization (EUA). This EUA will remain  in effect (meaning this test can be used) for the duration of the COVID-19 declaration under Section 564(b)(1) of the Act, 21 U.S.C.section 360bbb-3(b)(1), unless the authorization is terminated  or revoked sooner.       Influenza A by PCR NEGATIVE NEGATIVE Final   Influenza B by PCR NEGATIVE NEGATIVE Final    Comment: (NOTE) The Xpert Xpress SARS-CoV-2/FLU/RSV plus assay is intended as an aid in the diagnosis of influenza from Nasopharyngeal swab specimens and should not be used as a sole basis for treatment. Nasal washings and aspirates are unacceptable for Xpert Xpress SARS-CoV-2/FLU/RSV testing.  Fact Sheet for  Patients: EntrepreneurPulse.com.au  Fact Sheet for Healthcare Providers: IncredibleEmployment.be  This test is not yet approved or cleared by the [place] FDA and has been authorized for detection and/or diagnosis of SARS-CoV-2 by FDA under an Emergency Use Authorization (EUA). This EUA will remain in effect (meaning this test can be used) for the duration of the COVID-19 declaration under Section 564(b)(1) of the Act, 21 U.S.C. section 360bbb-3(b)(1), unless the authorization is terminated or revoked.     Resp Syncytial Virus by PCR NEGATIVE NEGATIVE Final    Comment: (NOTE) Fact Sheet for Patients: EntrepreneurPulse.com.au  Fact Sheet for Healthcare Providers: IncredibleEmployment.be  This test is not yet approved or cleared by the [place] FDA and has been authorized for detection and/or diagnosis of SARS-CoV-2 by FDA under an Emergency Use Authorization (EUA). This EUA will remain in effect (meaning this test can be used) for the duration of the COVID-19 declaration under Section 564(b)(1) of the Act, 21 U.S.C. section 360bbb-3(b)(1), unless the authorization is terminated or revoked.  Performed at [facility], [place] [address] [place], [place] [address]      Discharge Instructions:   Discharge Instructions     Diet - low sodium heart healthy   Complete  by: As directed    Diet Carb Modified   Complete by: As directed    Discharge instructions   Complete by: As directed    No driving while taking pain meds/valium   Increase activity slowly   Complete by: As directed    Increase activity slowly   Complete by: As directed       Allergies as of 08/15/2022       Reactions   Antihistamines, Diphenhydramine-type Hives, Other (See Comments)   Seizures   Bactrim [sulfamethoxazole-trimethoprim] Itching   Throat itching and suspected pill esophagitis   Demerol Hives    Morphine And Related Hives   Esp with high dose morphine, possibly Dilaudid Tolerates hydrocodone, oxycodone   Compazine [prochlorperazine] Other (See Comments)   Spastic movement   Metoprolol    Per patient caused "severe headache"   Oxycodone    Alters Personality   Statins Other (See Comments)   myalgias        Medication List     STOP taking these medications    botulinum toxin Type A 100 units Solr injection Commonly known as: BOTOX   cephALEXin 500 MG capsule Commonly known as: KEFLEX       TAKE these medications    acetaminophen 500 MG tablet Commonly known as: TYLENOL Take 2 tablets (1,000 mg total) by mouth every 6 (six) hours. What changed:  when to take this reasons to take this   amLODipine 10 MG tablet Commonly known as: NORVASC Take 1 tablet (10 mg total) by mouth daily.   aspirin 81 MG tablet Take 1 tablet (81 mg total) by mouth daily.   diazepam 2 MG tablet Commonly known as: VALIUM Take 1 tablet (2 mg total) by mouth every 12 (twelve) hours.   diclofenac Sodium 1 % Gel Commonly known as: VOLTAREN Apply 2 g topically 4 (four) times daily.   ezetimibe 10 MG tablet Commonly known as: ZETIA Take 1 tablet (10 mg total) by mouth daily.   ketorolac 10 MG tablet Commonly known as: TORADOL Q 8 hours x 2 days then q 8 hours PRN pain What changed:  how much to take how to take this when to take this reasons to take this additional instructions   oxyCODONE 5 MG immediate release tablet Commonly known as: Roxicodone Take 1 tablet (5 mg total) by mouth every 6 (six) hours as needed for severe pain (post-operatively).   Praluent 75 MG/ML Soaj Generic drug: Alirocumab INJECT 1 ML INTO THE SKIN EVERY 14 (FOURTEEN) DAYS.   senna-docusate 8.6-50 MG tablet Commonly known as: Senokot-S Take 1 tablet by mouth 2 (two) times daily. While taking strongest pain meds to prevent constipation   sitaGLIPtin 100 MG tablet Commonly known as:  JANUVIA Take 100 mg by mouth daily.   tamsulosin 0.4 MG Caps capsule Commonly known as: FLOMAX Take 0.4 mg by mouth daily.        Follow-up Information     Lurline Del, DO. Schedule an appointment as soon as possible for a visit in 1 week(s).   Specialty: Family Medicine Why: to follow up on your kidney function Contact information: [place] [address] [phone]                  Time coordinating discharge: 45 min  Signed:  Geradine Girt DO  [facility] 08/15/2022, 9:08 AM

## 2022-08-20 NOTE — Progress Notes (Signed)
[address]       [place] [address]             [phone]                                                   [facility] Record [id number] Date of Birth: 1970/06/29   Referring: Lurline Del, DO Primary Care: Lurline Del, DO Primary Cardiologist: Sherren Mocha, MD   Chief Complaint:   No chief complaint on file.     History of Present Illness:    Danny Brewer 52 y.o. male here for evaluation of chest pain s/p CABG in 2020 (by Dr. Servando Snare)  He returns for left sided chest pain. He recently had an ER admission for kidney stones and actually had a pnemonia and was in hospital for a week.  Now he is getting on disability and wants to wait a while. See back in 107months.    Per cardiology HPI: Danny Brewer is a 52 y.o. male with past medical history significant for coronary artery disease status post CABG 10/2018, reduced EF on Myoview 03/20/2022 but preserved EF on echocardiogram 03/25/2022 (EF 60 to 65%, no RWMA, normal RV SF, trivial MR, diabetes mellitus type 2, hypertension, hyperlipidemia (intolerant to statins and on Praluent), carotid stenosis (ultrasound 10/21/2020 with bilateral ICA 1 to 39% and updated ultrasound 03/21/2022 with no ICA stenosis bilaterally), ED, nephrolithiasis presents today for preop evaluation.   He was last seen 05/15/2022 and at that time he returned for a follow-up appointment after having a recent emergency room visit 9/23 for chest pain.  Lexiscan Myoview demonstrated no ischemia.  EF was 48%.  He needed to undergo right shoulder arthroplasty with Dr. Onnie Graham 07/03/2022.  This particular appointment is for surgery 07/10/2022 which is ureteroscopy with laser lithotripsy.    He was having a lot of chest symptoms since his surgery in 2020.  Recent Myoview was low risk.  Echocardiogram demonstrated normal LV function.  Referred back to CT surgery to see if there was anything anatomically that could be done to help  alleviate his chest pain.  Otherwise consider pain management for pain.  Continue to limit risk factors and continue on medications including aspirin 81 mg daily, Praluent 75 mg every 14 days.       Past Medical History:  Diagnosis Date   BPH (benign prostatic hyperplasia)     Carotid artery disease ([place])      [place] 5/22: Bilat [id number]    Complication of anesthesia      Pt verble and "prophecizes" after anesthesia   Coronary Artery Disease cardiologist--- dr cooper    Coronary CTA 09/2018: pLAD 70-90; pD1+pD2 70-90; mRCA mixed plaque - cannot rule out 70-90; FFR suggests hemodynamically significant stenosis // LHC 10/2018: 2v CAD >> s/p  CABG (L-LAD, L radial-RI, S-D2, S-RCA) with Dr. Servando Snare // Echo 10/23: EF 60-65, no RWMA, normal RVSF, trivial MR   ED (erectile dysfunction)     GERD      watches diet   History of kidney stones     Hypertension     Mixed hyperlipidemia     OA (osteoarthritis)      neck , back   S/P CABG x 4 10/17/2018    LIMA--LAD;  SVG--D2;  SVG--dRCA;  left radial ---  RI   Type 2 diabetes mellitus ([place])      followed by pcp   (10-01-2020 checks blood sugar 3 times wkly,  fasting sugar-- 72--105)   Wears glasses             Past Surgical History:  Procedure Laterality Date   CORONARY ARTERY BYPASS GRAFT N/A 10/17/2018    Procedure: CORONARY ARTERY BYPASS GRAFTING (CABG), ON PUMP, TIMES FOUR, USING LIMA TO LAD, ENDOSCOPICALLY HARVESTED GREATER SAPHENOUS VEIN TO DIAG 2, SVG TO DISTAL RCA, AND LEFT RADIAL TO RAMUS INTERMEDIUS;  Surgeon: Grace Isaac, MD;  Location: [facility];  Service: Open Heart Surgery;  Laterality: N/A;   CYSTOSCOPY WITH RETROGRADE PYELOGRAM, URETEROSCOPY AND STENT PLACEMENT Right 12/30/2021    Procedure: CYSTOSCOPY WITH RETROGRADE PYELOGRAM, URETEROSCOPY AND STENT PLACEMENT, BASKET OF STONES;  Surgeon: Alexis Frock, MD;  Location: [facility];  Service: Urology;  Laterality: Right;   ENDOVEIN HARVEST OF GREATER SAPHENOUS VEIN Right 10/17/2018     Procedure: [place] Of Greater Saphenous Vein;  Surgeon: Grace Isaac, MD;  Location: [facility];  Service: Open Heart Surgery;  Laterality: Right;   HAND SURGERY Left 1987    complex laceration   INGUINAL HERNIA REPAIR Bilateral 05/27/2015    Procedure: LAPAROSCOPIC BILATERAL  INGUINAL HERNIA REPAIRS WITH MESH;  Surgeon: Michael Boston, MD;  [facility];  Service: General;  Laterality: Bilateral;   KNEE ARTHROSCOPY Left 1991   LEFT HEART CATH AND CORONARY ANGIOGRAPHY N/A 10/14/2018    Procedure: LEFT HEART CATH AND CORONARY ANGIOGRAPHY;  Surgeon: Belva Crome, MD;  Location: [facility];  Service: Cardiovascular;  Laterality: N/A;   PENILE PROSTHESIS IMPLANT N/A 10/03/2020    Procedure: PLACEMENT OF PENILE PROTHESIS INFLATABLE;  Surgeon: Franchot Gallo, MD;  Location: [facility];  Service: Urology;  Laterality: N/A;  pubis area   RADIAL ARTERY HARVEST Left 10/17/2018    Procedure: RADIAL ARTERY HARVEST;  Surgeon: Grace Isaac, MD;  Location: [facility];  Service: Open Heart Surgery;  Laterality: Left;   REMOVAL OF PENILE PROSTHESIS N/A 07/07/2021    Procedure: EXPLANT  AND REPLACEMENT OF COLOPLAST TITAN PENILE PROSTHESIS;  Surgeon: Franchot Gallo, MD;  [facility];  Service: Urology;  Laterality: N/A;   SCROTAL EXPLORATION N/A 07/07/2021    Procedure: EXPLORATION OF PENILE PROSTHESIS;  Surgeon: Franchot Gallo, MD;  [facility];  Service: Urology;  Laterality: N/A;   SHOULDER CLOSED REDUCTION Left 09/04/2019    Procedure: CLOSED MANIPULATION SHOULDER;  Surgeon: Vickey Huger, MD;  [facility];  Service: Orthopedics;  Laterality: Left;   SHOULDER SURGERY Left 2022   TEE WITHOUT CARDIOVERSION N/A 10/17/2018    Procedure: TRANSESOPHAGEAL ECHOCARDIOGRAM (TEE);  Surgeon: Grace Isaac, MD;  Location: [facility];  Service: Open Heart Surgery;  Laterality: N/A;           Family History  Problem Relation Age of Onset   Hypertension  Mother     Cancer Father     Heart failure Father     Sudden death Brother     Heart attack Brother     Hypertension Sister     Heart murmur Sister          Social History       Tobacco Use  Smoking Status Never  Smokeless Tobacco Never    Social History       Substance and Sexual Activity  Alcohol Use No  Allergies  Allergen Reactions   Antihistamines, Diphenhydramine-Type Hives and Other (See Comments)      Seizures   Bactrim [Sulfamethoxazole-Trimethoprim] Itching      Throat itching and suspected pill esophagitis   Demerol Hives   Morphine And Related Hives      Esp with high dose morphine, possibly Dilaudid Tolerates hydrocodone, oxycodone   Compazine [Prochlorperazine] Other (See Comments)      Spastic movement   Metoprolol        Per patient caused "severe headache"   Statins Other (See Comments)      myalgias            Current Outpatient Medications  Medication Sig Dispense Refill   acetaminophen (TYLENOL) 500 MG tablet Take 2 tablets (1,000 mg total) by mouth every 6 (six) hours. 50 tablet 0   Alirocumab (PRALUENT) 75 MG/ML SOAJ INJECT 1 ML INTO THE SKIN EVERY 14 (FOURTEEN) DAYS. 2 mL 11   amLODipine (NORVASC) 10 MG tablet Take 1 tablet (10 mg total) by mouth daily. 90 tablet 3   aspirin 81 MG tablet Take 1 tablet (81 mg total) by mouth daily.       azithromycin (ZITHROMAX) 250 MG tablet Take 2 tablets on day one, then take 1 tablet daily after that until finished 6 each 0   botulinum toxin Type A (BOTOX) 100 units SOLR injection Inject 100 Units into the muscle every 3 (three) months. To be administered by MD. 1 each 0   ezetimibe (ZETIA) 10 MG tablet Take 1 tablet (10 mg total) by mouth daily. 90 tablet 2   methocarbamol (ROBAXIN) 500 MG tablet Take 500 mg by mouth at bedtime.       predniSONE (DELTASONE) 20 MG tablet Take 20 mg by mouth as needed (pain).       sitaGLIPtin (JANUVIA) 100 MG tablet Take 100 mg by mouth daily.        tamsulosin (FLOMAX) 0.4 MG CAPS capsule Take 0.4 mg by mouth at bedtime.        No current facility-administered medications for this visit.      ROS 14 point ROS reviewed and negative except as per HPI     PHYSICAL EXAMINATION: There were no vitals taken for this visit.   Gen: NAD Neuro: Alert and oriented Resp: Nonlaboured Chest +TTP over left chest 3 cm laterally. No sternal instability. No pain of sternum itself.  Abd: Soft, ntnd Extr: WWP   Diagnostic Studies & Laboratory data:     Recent Radiology Findings:    Imaging Results  CT Chest Wo Contrast   Result Date: 06/02/2022 CLINICAL DATA:  Nonspecific chest wall pain following surgery. EXAM: CT CHEST WITHOUT CONTRAST TECHNIQUE: Multidetector CT imaging of the chest was performed following the standard protocol without IV contrast. RADIATION DOSE REDUCTION: This exam was performed according to the departmental dose-optimization program which includes automated exposure control, adjustment of the mA and/or kV according to patient size and/or use of iterative reconstruction technique. COMPARISON:  Chest CTA 12/30/2021. FINDINGS: Cardiovascular: Atherosclerosis of the aorta, great vessels and coronary arteries status post median sternotomy and CABG. The heart size is normal. There is no pericardial effusion. Mediastinum/Nodes: There are no enlarged mediastinal, hilar or axillary lymph nodes.Hilar assessment is limited by the lack of intravenous contrast, although the hilar contours appear unchanged. The thyroid gland, trachea and esophagus demonstrate no significant findings. Lungs/Pleura: No pleural effusion or pneumothorax. Interval improved aeration of the lungs which are essentially clear. No suspicious pulmonary nodule.  Upper abdomen: Small nonobstructing calculi within the upper pole of the left kidney. The visualized upper abdomen is otherwise unremarkable. Musculoskeletal/Chest wall: There is no chest wall mass or suspicious  osseous finding. The median sternotomy appears healed. The sternotomy wires are intact. No surrounding inflammatory changes. IMPRESSION: 1. No acute chest findings or explanation for the patient's symptoms. 2. The median sternotomy appears healed. No surrounding inflammatory changes. 3. Nonobstructing left renal calculi. 4.  Aortic Atherosclerosis (ICD10-I70.0). Electronically Signed   By: Richardean Sale M.D.   On: 06/02/2022 13:10    MR BRAIN WO CONTRAST   Result Date: 05/23/2022  [facility] [address], [place], [place] [address] [phone] NEUROIMAGING REPORT STUDY DATE: 05/23/2022 PATIENT NAME: Nichalas Shupe DOB: [birth year] MRN: [id number] EXAM: MRI Brain without contrast ORDERING CLINICIAN: Richard A. Sater, MD. PhD CLINICAL HISTORY: 53 year old man with headaches and left hemifacial spasms COMPARISON FILMS: 11/09/2021 TECHNIQUE: MRI of the brain without contrast was obtained utilizing 5 mm axial slices with T1, T2, T2 flair, SWI and diffusion weighted views.  T1 sagittal and T2 coronal views were obtained. CONTRAST: none IMAGING SITE: [facility], [place], [place] FINDINGS: On sagittal images, the spinal cord is imaged caudally to C3 and is normal in caliber.   The contents of the posterior fossa are of normal size and position.   The pituitary gland and optic chiasm appear normal.    Brain volume appears normal.   There is a cavum of septum pellucidum at vergae.  There are no abnormal extra-axial collections of fluid.  There is a T2/flair hyperintense focus in the posterior right pons, unchanged compared to the previous MRI.  The cerebellum appears normal.   The deep gray matter appears normal.  The cerebral hemispheres appear normal.  Diffusion weighted images are normal.  Susceptibility weighted images show a chronic microhemorrhage in the left temporal lobe, also seen on the previous MRI As seen on previous MRI, the right vertebral artery and basilar  artery are tortuous and distorted the dorsal root entry zone and transitional zone of the left trigeminal nerve and distorts the left facial nerve..  The orbits appear normal.   The VIIth/VIIIth nerve complex appears normal.  The mastoid air cells appear normal.  Mucoperiosteal thickening of the maxillary sinuses, some of the ethmoid air cells and the left hemi sphenoid is noted..     This MRI of the brain without contrast shows the following: The right vertebral artery and the basilar artery are tortuous and distort the left trigeminal and facial nerves.  This is unchanged compared to the MRI from 11/09/2021 T2/FLAIR hyperintense focus in the dorsal right pons likely represents a small focus of chronic microvascular ischemic change, unchanged compared to the previous MRI. Single chronic microhemorrhage in the left temporal lobe.  A single focus is unlikely to be clinically significant. Chronic maxillary, ethmoid and sphenoid sinusitis. No acute findings. INTERPRETING PHYSICIAN: Richard A. Felecia Shelling, MD, PhD, FAAN Certified in  Neuroimaging by [employer]           I have independently reviewed the above radiology studies  and reviewed the findings with the patient.    Recent Lab Findings: Recent Labs       Lab Results  Component Value Date    WBC 6.0 02/27/2022    HGB 13.1 02/27/2022    HCT 41.1 02/27/2022    PLT 282 02/27/2022    GLUCOSE 94 02/27/2022    CHOL 136 07/23/2021  TRIG 50 07/23/2021    HDL 56 07/23/2021    LDLCALC 69 07/23/2021    ALT 12 12/30/2021    AST 16 12/30/2021    NA 140 02/27/2022    K 3.6 02/27/2022    CL 108 02/27/2022    CREATININE 1.12 02/27/2022    BUN 10 02/27/2022    CO2 26 02/27/2022    TSH 3.900 09/18/2018    INR 1.0 04/22/2020    HGBA1C 6.3 (H) 06/16/2021          Assessment / Plan:   Mayco Sobalvarro 52 y.o. male here for evaluation of chest pain s/p CABG in 2020 (by Dr. Servando Snare)    He has chest pain 2-3 cm left lateral to  sternum. Has had since surgery. Feels deeper, not at skin.    Typically answer for this would be pain meds PRN as there is no significant defect on Ct.    I've seen a number of patients from areas of [place] and [place] after CAB with both sternal and chest wall issues. 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    I've treated lateralizaing chest wall pain with intercostal ablation successfully a number of times. I went over with the patient this is off label.  It may provide no benefit. It has a risk to life and of morbidity (bleeding, infection, damage surrounding structures). Specifically I told him even could worsen nerve pain. I would do this from outside the chest as he may have internal adhesions from mammary takedown.    Risks/benefits/alternatives were discussed at length (95% straightforward recovery, 4% morbidity [any organ, infection, bleeding, conversion to large open surgery, damage to surrounding structures, worsened pain, complex regional pain syndrome, very large area of long-term numbness around left side and any resultant injury related to that], 1% mortality]. Straightforward recovery typically entails 1 ICU day, possibly, 1-2 days on the floor, seeing me back in clinic at 1 week and 1 month postoperatively. No lifting greater than 30 lbs for 4 weeks. Total recovery expected by 2 months. All questions were asked and answered.   We will go ahead and schedule extensive intercostal cryoablation.   Would be good to have a R sided art line when we do this.   09/03/22 UPDATE Wants to wait 3 months. See back then. Getting on disability.     I  spent 30 minutes with  the patient face to face in counseling and coordination of care.     Pierre Bali Jeriko Kowalke 06/21/2022 4:33 PM

## 2022-08-25 ENCOUNTER — Other Ambulatory Visit (HOSPITAL_COMMUNITY): Payer: Self-pay

## 2022-08-25 NOTE — Telephone Encounter (Signed)
Pt has new BCBS, are you able to work on getting an [place] under this insurance? He's scheduled for 09/07/22.  BCBS ID [id number]

## 2022-08-25 NOTE — Telephone Encounter (Signed)
Pharmacy Patient Advocate Encounter   Received notification from [facility] that prior authorization for Botox 100 units is required/requested.   PA submitted on 08/25/2022 to (ins) [employer] via [employer] or [facility]) confirmation # [id number] Status is pending

## 2022-08-25 NOTE — Telephone Encounter (Signed)
Botox One-Benefit Verification [id number] Submitted!

## 2022-08-27 NOTE — Telephone Encounter (Signed)
Dr. Felecia Shelling- pt plan covers xeomin, not botox. Did you want to go ahead and change to Xeomin? If so, what would you like ordered/how many units?

## 2022-08-27 NOTE — Telephone Encounter (Signed)
Needs Xeomin auth. 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

## 2022-08-27 NOTE — Telephone Encounter (Signed)
Pharmacy Patient Advocate Encounter  Received notification from [facility] that the request for prior authorization for Botox 100 units has been denied due to see below.       Please be advised we currently do not have a Pharmacist to review denials, therefore you will need to process appeals accordingly as needed. Thanks for your support at this time.  Denial letter has been scanned into chart under the media tab.

## 2022-08-27 NOTE — Telephone Encounter (Signed)
What dose/units do you want to order for Xeomin?

## 2022-08-28 ENCOUNTER — Other Ambulatory Visit (HOSPITAL_COMMUNITY): Payer: Self-pay

## 2022-08-28 NOTE — Telephone Encounter (Signed)
Submitted a benefits verification to the Xeomin Portal.

## 2022-08-31 ENCOUNTER — Other Ambulatory Visit (HOSPITAL_COMMUNITY): Payer: Self-pay

## 2022-08-31 NOTE — Telephone Encounter (Signed)
Patient Advocate Encounter  Received a fax from [facility] regarding Prior Authorization for Xeomin 100 units.   Authorization has been DENIED due to    Full determination letter attached to patient chart

## 2022-08-31 NOTE — Telephone Encounter (Signed)
Pharmacy Patient Advocate Encounter   Received notification from [facility] that prior authorization for Xeomin 100 Units is required/requested.   PA submitted on 08/31/2022 to (ins) [employer] via [employer] or [facility]) confirmation # [id number] Status is pending    Pharmacy Patient Advocate Encounter   Received notification from [facility] that prior authorization for Xeomin 100 units is required/requested.   PA submitted on 08/31/2022 to (ins) [employer] via CoverMyMeds Key or (Medicaid) confirmation # [id number] Status is pending

## 2022-09-01 NOTE — Telephone Encounter (Signed)
Hello, can you verify which diagnosis was used for xeomin auth request?

## 2022-09-01 NOTE — Telephone Encounter (Signed)
Dr. Felecia Shelling- how do you want to proceed?

## 2022-09-02 NOTE — Telephone Encounter (Signed)
Dr. Felecia Shelling- do you want to try and appeal denial?

## 2022-09-03 ENCOUNTER — Ambulatory Visit (INDEPENDENT_AMBULATORY_CARE_PROVIDER_SITE_OTHER): Payer: BC Managed Care – PPO | Admitting: Cardiothoracic Surgery

## 2022-09-03 VITALS — BP 128/86 | HR 90 | Resp 20 | Ht 69.0 in | Wt 190.0 lb

## 2022-09-03 DIAGNOSIS — Z951 Presence of aortocoronary bypass graft: Secondary | ICD-10-CM

## 2022-09-04 NOTE — Telephone Encounter (Signed)
Please advise below, pt scheduled for 4/1.

## 2022-09-07 ENCOUNTER — Ambulatory Visit: Payer: BC Managed Care – PPO | Admitting: Neurology

## 2022-09-07 ENCOUNTER — Encounter: Payer: Self-pay | Admitting: Neurology

## 2022-09-07 NOTE — Telephone Encounter (Signed)
Terrence Dupont, are you able to get appeal contact info? Sent MyChart msg to pt about today's appt.

## 2022-09-07 NOTE — Telephone Encounter (Signed)
Dr. Felecia Shelling- it is scanned under the media tab in epic dated 08/31/22

## 2022-09-09 NOTE — Telephone Encounter (Signed)
Called and spoke w/ pt. He reports he got approval letter for Botox in March 2024. He will bring it by office tomorrow for [place] to review.  He is aware I will need him to sign consent form for appeal we are trying to complete as well. He will ask for  me when he comes in.

## 2022-09-16 NOTE — Telephone Encounter (Signed)
Pt came today and signed consent form. Appeal faxed to [facility] appeals at [phone]. Received fax confirmation.

## 2022-09-21 NOTE — Telephone Encounter (Signed)
Received fax from [facility] requesting medical records for tried/failed meds be faxed back for appeal. I faxed this back to fax [phone]. Received fax confirmation.

## 2022-09-29 NOTE — Telephone Encounter (Signed)
LVM for [employer] at [phone] (listed on med record request form that was sent) asking for her to call with update on appeal decision. Appeal ID: [id number]. Provided office phone# for her to call back.   Called customer care at [phone]. Spoke w/ Samara Deist. She advised me to call [phone] to check on status of appeal (BCBS)

## 2022-10-01 NOTE — Telephone Encounter (Signed)
Called [phone]. Spoke w/ Dennard Nip. States appeal still pending. Should have determination by 10/16/22.  Call ref# [id number]

## 2022-10-21 NOTE — Telephone Encounter (Signed)
Called BCBS at [phone]. Spoke w/ Aram Beecham B or transferred me to appeals department [phone]. Spoke w/ rep. States appeal denied. She will fax copy for MD to review at [phone]. Call VHQ#[phone]

## 2022-10-28 NOTE — Telephone Encounter (Signed)
Called appeal department at  [phone]. Spoke w/ Karleen Hampshire. (Care management). Advised we never received appeal denial letter. She will re-fax to [phone]. Call ref# Karleen Hampshire. 10/28/22  Appeal ID: [id number]

## 2022-11-04 ENCOUNTER — Other Ambulatory Visit (HOSPITAL_COMMUNITY): Payer: Self-pay

## 2022-11-04 NOTE — Telephone Encounter (Signed)
Pharmacy Patient Advocate Encounter   Received notification from [facility] that prior authorization for Xeomin 100 UNits is required/requested.   PA submitted on 11/04/2022 to (ins) [facility] via CoverMyMeds Key or [facility]) confirmation # [id number] Status is pending

## 2022-11-04 NOTE — Telephone Encounter (Signed)
Received denial letter for brand Botox. Per Dr. Epimenio Foot, letter states Xeomin is the formulary alternative.Would like to try and get Xeomin approved.   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

## 2022-11-05 NOTE — Telephone Encounter (Signed)
Pharmacy Patient Advocate Encounter  Received notification from [facility] that the request for prior authorization for Xeomin 100 UNits has been denied due to see below.       Please be advised we currently do not have a Pharmacist to review denials, therefore you will need to process appeals accordingly as needed. Thanks for your support at this time.   You may  fax [phone], to appeal.  The denial letter has been scanned into the chart.  This was denied via pharmacy benefits-I will complete the medical PA form for BCBS and get it faxed today to see if we can get approval via medical benefit.

## 2022-11-10 NOTE — Telephone Encounter (Signed)
Hi Danny Brewer, just checking in to see if you have heard back from his medical plan after submitting auth there?

## 2022-11-10 NOTE — Telephone Encounter (Signed)
Danny Brewer- see your note below. You documented 5 days ago (when you were still working on [employer]): "This was denied via pharmacy benefits-I will complete the medical PA form for BCBS and get it faxed today to see if we can get approval via medical benefit."

## 2022-11-10 NOTE — Telephone Encounter (Signed)
Pharmacy Patient Advocate Encounter   Received notification from [facility] that prior authorization for Xeomin 100 UNits is required/requested.   PA submitted to [facility] via FAX [phone]  Status is pending  Requested an Urgent Review  Will update once we hear back from [facility].

## 2022-11-10 NOTE — Telephone Encounter (Signed)
I have not submitted a PA to the medical benefits-It was only to pharmacy. I no longer work on Botox or Xeomin for [employer] refer this to the person who handles that for [employer] now. Thanks.

## 2022-11-10 NOTE — Telephone Encounter (Signed)
Emma-My apologies. I missed that part when I was reading thru the comments-I went and looked thru my documents but I must have got sidetracked somewhere along the way. I am working on that form right now for the PT and will document as soon as I fax it today. Sorry for the confusion.

## 2022-11-12 ENCOUNTER — Telehealth: Payer: Self-pay | Admitting: *Deleted

## 2022-11-12 DIAGNOSIS — E785 Hyperlipidemia, unspecified: Secondary | ICD-10-CM

## 2022-11-12 NOTE — Telephone Encounter (Signed)
-----   Message from Beatrice Lecher, [place] sent at 11/11/2022  4:56 PM EDT ----- Results sent to [name] via MyChart. See MyChart comments below. PLAN:  -Refer to Lipid Clinic (?[facility])  Danny Brewer  Your LDL cholesterol is too high. It should be less than 55. It is currently 121. I will refer you back to our Lipid Clinic to see you are a candidate for a newer medication that is given every 6 mos. Tereso Newcomer, PA-C

## 2022-11-12 NOTE — Addendum Note (Signed)
Addended by: Burnetta Sabin on: 11/12/2022 10:50 AM   Modules accepted: Orders

## 2022-11-12 NOTE — Telephone Encounter (Signed)
Buy and Bill:   

## 2022-11-16 ENCOUNTER — Ambulatory Visit: Payer: BC Managed Care – PPO | Attending: Internal Medicine | Admitting: Pharmacist

## 2022-11-16 ENCOUNTER — Other Ambulatory Visit (HOSPITAL_COMMUNITY): Payer: Self-pay

## 2022-11-16 ENCOUNTER — Telehealth: Payer: Self-pay | Admitting: Pharmacist

## 2022-11-16 DIAGNOSIS — E119 Type 2 diabetes mellitus without complications: Secondary | ICD-10-CM

## 2022-11-16 DIAGNOSIS — I2510 Atherosclerotic heart disease of native coronary artery without angina pectoris: Secondary | ICD-10-CM

## 2022-11-16 DIAGNOSIS — Z7984 Long term (current) use of oral hypoglycemic drugs: Secondary | ICD-10-CM | POA: Diagnosis not present

## 2022-11-16 DIAGNOSIS — E7800 Pure hypercholesterolemia, unspecified: Secondary | ICD-10-CM

## 2022-11-16 MED ORDER — REPATHA SURECLICK 140 MG/ML ~~LOC~~ SOAJ: 140.0000 mg | SUBCUTANEOUS | 3 refills | Status: DC

## 2022-11-16 MED ORDER — PRALUENT 75 MG/ML ~~LOC~~ SOAJ: SUBCUTANEOUS | 3 refills | Status: DC

## 2022-11-16 MED ORDER — NEXLIZET 180-10 MG PO TABS: 1.0000 | ORAL_TABLET | Freq: Every day | ORAL | 3 refills | Status: DC

## 2022-11-16 MED ORDER — EMPAGLIFLOZIN 25 MG PO TABS: 25.0000 mg | ORAL_TABLET | Freq: Every day | ORAL | 3 refills | Status: DC

## 2022-11-16 NOTE — Telephone Encounter (Signed)
Called and scheduled pt for 12/09/22 at 2:00 pm with Dr. Epimenio Foot.

## 2022-11-16 NOTE — Patient Instructions (Addendum)
Your LDL cholesterol was 121 and your goal is < 55 -Restart your Praluent injections every 2 weeks -We'll replace your Zetia (ezetimibe) with a different pill called Nexlizet - this helps lower your cholesterol a bit more  We'll replace your Januvia with either Jardiance or [place]. I'll call you when I hear back from your insurance with coverage on these medications. Jardiance and Marcelline Deist help protect your heart and kidneys, the Januvia doesn't have this added benefit.

## 2022-11-16 NOTE — Progress Notes (Unsigned)
Patient ID: Danny Brewer                 DOB: [birth year]                    MRN: [id number]     HPI: Danny Brewer is a 52 y.o. male patient of Dr Excell Seltzer referred to lipid clinic by Tereso Newcomer, PA. PMH is significant for CABG in 2020, T2DM, HTN, HLD, CKD, carotid stenosis, ED, and nephrolithiasis. LDL elevated last month and pt referred to lipid clinic.  Pt has been taking his Zetia but stopped taking his Praluent about 3 months ago. Didn't have issues tolerating it, had a lot going on with 3 emergency surgeries and stopped it. Previously intolerant to 2 statins.  Takes Januvia for his DM, A1c well controlled in the low 6s. Prior GI issues on metformin, has not tried other meds.  Has State health plan and Medicaid insurance.  Current Medications: Praluent 75mg  Q2W - hasn't been taking, ezetimibe 10mg  daily Intolerances: atorvastatin 20, 40, and 80mg  daily, rosuvastatin 10mg  daily - myalgias Risk Factors: premature ASCVD, DM, HTN LDL goal: 55mg /dL  Diet: trying to cut back on sugar, white bread, fried food, mayo, and sweet tea.  Exercise: plans to start back walking   Family History: Mother with HTN, father with HF, brother with MI and sudden death, sister with HTN.  Social History: No tobacco, alcohol or drug use.  Labs: 11/04/22: TC 188, TG 89, HDL 51, LDL 121, nonHDL 138 - on Zetia 07/23/21: TC 136, TG 50, HDL 56, LDL 69 - on Zetia, Praluent 05/15/21: TC 240, TG 71, HDL 54, LDL 174  Past Medical History:  Diagnosis Date   BPH (benign prostatic hyperplasia)    Coronary Artery Disease    cardiologist--- dr Excell Seltzer;    Coronary CTA 09/2018: pLAD 70-90; pD1+pD2 70-90; mRCA mixed plaque - cannot rule out 70-90; FFR suggests hemodynamically significant stenosis // LHC 10/2018: 2v CAD >> s/p  CABG (L-LAD, L radial-RI, S-D2, S-RCA) with Dr. 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 (Patient taking differently: Take 1,000 mg by mouth every 6 (six) hours as needed for moderate pain.) 50 tablet 0   Alirocumab (PRALUENT) 75 MG/ML SOAJ INJECT 1 ML INTO THE SKIN EVERY 14 (FOURTEEN) DAYS. 2 mL 11   amLODipine (NORVASC) 10 MG tablet Take 1 tablet (10 mg total) by mouth daily. (Patient taking differently: Take 10 mg by mouth daily.) 90 tablet 3   aspirin 81 MG tablet Take 1 tablet (81 mg total) by mouth daily.     diazepam (VALIUM) 2 MG tablet Take 1 tablet (2 mg total) by mouth  every 12 (twelve) hours. 20 tablet 0   diclofenac Sodium (VOLTAREN) 1 % GEL Apply 2 g topically 4 (four) times daily. 150 g 0   ezetimibe (ZETIA) 10 MG tablet Take 1 tablet (10 mg total) by mouth daily. 90 tablet 2   ketorolac (TORADOL) 10 MG tablet Q 8 hours x 2 days then q 8 hours PRN pain 20 tablet 0   oxyCODONE (ROXICODONE) 5 MG immediate release tablet Take 1 tablet (5 mg total) by mouth every 6 (six) hours as needed for severe pain (post-operatively). (Patient not taking: Reported on 09/03/2022) 10 tablet 0   senna-docusate (SENOKOT-S) 8.6-50 MG tablet Take 1 tablet by mouth 2 (two) times daily. While taking strongest pain meds to prevent constipation (Patient not taking: Reported on 09/03/2022) 10 tablet 0   sitaGLIPtin (JANUVIA) 100 MG tablet Take 100 mg by mouth daily.     tamsulosin (FLOMAX) 0.4 MG CAPS capsule Take 0.4 mg by mouth daily.     No  current facility-administered medications on file prior to visit.    Allergies  Allergen Reactions   Antihistamines, Diphenhydramine-Type Hives and Other (See Comments)    Seizures   Bactrim [Sulfamethoxazole-Trimethoprim] Itching    Throat itching and suspected pill esophagitis   Demerol Hives   Morphine And Codeine Hives    Esp with high dose morphine, possibly Dilaudid Tolerates hydrocodone, oxycodone   Compazine [Prochlorperazine] Other (See Comments)    Spastic movement   Metoprolol     Per patient caused "severe headache"   Oxycodone     Alters Personality   Statins Other (See Comments)    myalgias    Assessment/Plan:  1. Hyperlipidemia - Baseline LDL 174, previously 69 on Praluent and Zetia, currently 121 on Zetia as he stopped his Praluent a few months ago when he had a few surgeries. His LDL goal is < 55 given premature ASCVD, DM, and CKD. Previously intolerant to 2 statins (myalgias). Will resume PCSK9i. Tried sending in rx for Praluent, looks like his insurance only covers Repatha this year. Prior auth submitted and updated rx sent in. Will replace his ezetimibe with Nexlizet to help reach LDL goal < 55.  2. T2DM - Z6X well controlled at 6.2% on Januvia. Previously intolerant to metformin (GI). Discussed that Januvia is fine for glucose lowering but SGLT2i would provide added CV and renal benefits that his Januvia does not provide. Will stop Januvia and start Jardiance 25mg  daily.  Will plan to recheck CMET and lipids in 6 weeks.  Kendricks Reap E. Julissa Browning, PharmD, BCACP, CPP [facility] [address], [place], [place] [address] Phone: [phone]; Fax: [phone] 11/16/2022 4:07 PM

## 2022-11-16 NOTE — Telephone Encounter (Signed)
Nexlizet PA submitted, Key: [id number]. Januvia on formulary, doesn't need PA Praluent not preferred this year, Repatha is.  Repatha PA submitted, Key: [id number].  Called pt's pharmacy, advised them to remove Praluent, ezetimibe, and Januvia from his med list and to fill Repatha, Nexlizet, and Jardiance. Advised them that PA for Repatha and Nexlizet have been submitted.   Will call pt and advise him of PCSK9i change (discovered formulary change after his visit) and other meds once we receive PA approvals. Will need lipid panel and CMET checked in 6 weeks after approval.

## 2022-11-17 MED ORDER — NEXLIZET 180-10 MG PO TABS: 1.0000 | ORAL_TABLET | Freq: Every day | ORAL | 3 refills | Status: DC

## 2022-11-17 MED ORDER — REPATHA SURECLICK 140 MG/ML ~~LOC~~ SOAJ: 140.0000 mg | SUBCUTANEOUS | 3 refills | Status: DC

## 2022-11-17 NOTE — Telephone Encounter (Signed)
Repatha PA approved through 11/16/23. Nexlizet PA approved through 11/14/25.   Spoke with pt, he is aware to: -Stop Januvia, start Jardiance -Stop ezetimibe, start Nexlizet -Start Repatha since insurance stopped covering Praluent  Scheduled labs for fasting lipid and CMET in 6 weeks.

## 2022-12-07 ENCOUNTER — Ambulatory Visit: Payer: BC Managed Care – PPO | Admitting: Cardiothoracic Surgery

## 2022-12-09 ENCOUNTER — Encounter: Payer: Self-pay | Admitting: Neurology

## 2022-12-09 ENCOUNTER — Ambulatory Visit (INDEPENDENT_AMBULATORY_CARE_PROVIDER_SITE_OTHER): Payer: BC Managed Care – PPO | Admitting: Neurology

## 2022-12-09 VITALS — BP 132/88 | HR 79 | Ht 69.0 in | Wt 185.5 lb

## 2022-12-09 DIAGNOSIS — G5132 Clonic hemifacial spasm, left: Secondary | ICD-10-CM

## 2022-12-09 DIAGNOSIS — R519 Headache, unspecified: Secondary | ICD-10-CM

## 2022-12-09 DIAGNOSIS — R1310 Dysphagia, unspecified: Secondary | ICD-10-CM | POA: Diagnosis not present

## 2022-12-09 MED ORDER — INCOBOTULINUMTOXINA 100 UNITS IM SOLR
70.0000 [IU] | INTRAMUSCULAR | Status: AC
Start: 2022-12-09 — End: ?
  Administered 2022-12-09: 70 [IU] via INTRAMUSCULAR

## 2022-12-09 NOTE — Progress Notes (Signed)
[facility]  PATIENT: Danny Brewer DOB: 10-11-1970  REFERRING DOCTOR OR PCP:  Lynnea Ferrier is PCP; referred by Jene Every SOURCE: Patient, notes from Dr. Shelle Iron, multiple MRI and CT reports and images on PACS  _________________________________   HISTORICAL  CHIEF COMPLAINT:  Chief Complaint  Patient presents with   Room 10    Pt is here Alone. Pt states that he has been having bad acid reflux lately.     HISTORY OF PRESENT ILLNESS:  Update 12/09/2022 He felt the HFS improved a lot after the Xeomin injections   He got 4.5-5 good months and last month the left hemifacial spasms started returning.   He has not had return of the jaw spasms.    He denies ptosis or facial weakness.   He has had some dysphagia and has known GERD.   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   Oral medications had not helped and we will do Botox.  If no benefit also consider referral to Neurosurgery for microvascular decompression.    Before the Botox, he had HFS and some episodes of the left jaw tightening up for 30-90 minutes about once a month,  This is painful  like intense pins and needles.  Sometimes his happene after eating .  The painful jaw tightening also improved with chemodenervation.    Vascular risks:  Type 2 NIDDM (since 2018), HTN, elevated cholesterol.      He denies any difficulty with gait, balance, numbness or strength now.   He sleeps well most nights.  He tries to  Live a stress free life.    No depression or anxiety.      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    MRI of the cervical spine 03/11/2017. It showed degenerative changes at C4-C5, C5-C6 and  C6-C7 but also showed a small T2 hyperintense focus within the right dorsolateral pons. He was referred for further evaluation.  MRI of the brain dated 08/25/2015 and 02/23/2010 show a stable 3-4 mm focus in the posterior right pons near the middle cerebellar peduncle. It is unchanged in size or signal characteristics.  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     MRI brain 04/22/2020 showed just one T2/FLAIR hyperintense focus in the right posterior pons, present in 2017 and one left temporal chronic microhemorrhage, not present in 2017     REVIEW OF SYSTEMS: Constitutional: No fevers, chills, sweats, or change in appetite.   He has excessive sleepiness Eyes: No visual changes, double vision, eye pain Ear, nose and throat: No hearing loss, ear pain, nasal congestion, sore throat Cardiovascular: No chest pain, palpitations Respiratory:  No shortness of breath at rest or with exertion.   No wheezes.  He snores GastrointestinaI: No nausea, vomiting, diarrhea, abdominal pain, fecal incontinence Genitourinary:  No dysuria, urinary retention or frequency.  No nocturia. Musculoskeletal:  No neck pain, back pain Integumentary: No rash, pruritus, skin lesions Neurological: as above Psychiatric: No depression at this time.  No anxiety Endocrine: No palpitations, diaphoresis, change in appetite, change in weigh or increased thirst Hematologic/Lymphatic:  No anemia, purpura, petechiae. Allergic/Immunologic: No itchy/runny eyes, nasal congestion, recent allergic reactions,  rashes  ALLERGIES: Allergies  Allergen Reactions   Antihistamines, Diphenhydramine-Type Hives and Other (See Comments)    Seizures   Bactrim [Sulfamethoxazole-Trimethoprim] Itching    Throat itching and suspected pill esophagitis   Demerol Hives   Morphine And Codeine Hives    Esp with high dose morphine, possibly Dilaudid Tolerates hydrocodone, oxycodone   Compazine [Prochlorperazine] Other (See Comments)    Spastic movement    Metoprolol     Per patient caused "severe headache"   Oxycodone     Alters Personality   Statins Other (See Comments)    Myalgias on atorvastatin 20-80mg  daily and rosuvastatin 10mg  daily    HOME MEDICATIONS:  Current Outpatient Medications:    acetaminophen (TYLENOL) 500 MG tablet, Take 2 tablets (1,000 mg total) by mouth every 6 (six) hours. (Patient taking differently: Take 1,000 mg by mouth every 6 (six) hours as needed for moderate pain.), Disp: 50 tablet, Rfl: 0   amLODipine (NORVASC) 10 MG tablet, Take 1 tablet (10 mg total) by mouth daily. (Patient taking differently: Take 10 mg by mouth daily.), Disp: 90 tablet, Rfl: 3   aspirin 81 MG tablet, Take 1 tablet (81 mg total) by mouth daily., Disp: , Rfl:    Bempedoic Acid-Ezetimibe (NEXLIZET) 180-10 MG TABS, Take 1 tablet by mouth daily., Disp: 90 tablet, Rfl: 3   diazepam (VALIUM) 2 MG tablet, Take 1 tablet (2 mg total) by mouth every 12 (twelve) hours., Disp: 20 tablet, Rfl: 0   diclofenac Sodium (VOLTAREN) 1 % GEL, Apply 2 g topically 4 (four) times daily., Disp: 150 g, Rfl: 0   empagliflozin (JARDIANCE) 25 MG TABS tablet, Take 1 tablet (25 mg total) by mouth daily before breakfast., Disp: 90 tablet, Rfl: 3   Evolocumab (REPATHA SURECLICK) 140 MG/ML SOAJ, Inject 140 mg into the skin every 14 (fourteen) days., Disp: 6 mL, Rfl: 3   ketorolac (TORADOL) 10 MG tablet, Q 8 hours x 2 days then q 8 hours PRN pain, Disp: 20 tablet, Rfl: 0   oxyCODONE (ROXICODONE) 5 MG immediate release tablet, Take 1 tablet (5 mg total) by mouth every 6 (six) hours as needed for severe pain (post-operatively)., Disp: 10 tablet, Rfl: 0   senna-docusate (SENOKOT-S) 8.6-50 MG tablet, Take 1 tablet by mouth 2 (two) times daily. While taking strongest pain meds to prevent constipation, Disp: 10 tablet, Rfl: 0   tamsulosin (FLOMAX) 0.4 MG CAPS capsule, Take 0.4 mg by mouth daily., Disp: , Rfl:   PAST MEDICAL HISTORY: Past Medical History:  Diagnosis Date   BPH  (benign prostatic hyperplasia)    Coronary Artery Disease    cardiologist--- dr Excell Seltzer;    Coronary CTA 09/2018: pLAD 70-90; pD1+pD2 70-90; mRCA mixed plaque - cannot rule out 70-90; FFR suggests hemodynamically significant stenosis // LHC 10/2018: 2v CAD >> s/p  CABG (L-LAD, L radial-RI, S-D2, S-RCA) with Dr. 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   LIMA--LAD;  SVG--D2;  SVG--dRCA;  left radial --- RI   Type 2 diabetes mellitus (HCC)    followed by pcp   (08-05-2022 checks blood sugar daily am fasting,  fasting sugar--  77-100)   Wears glasses     PAST SURGICAL HISTORY: Past Surgical History:  Procedure Laterality Date   CORONARY ARTERY BYPASS GRAFT N/A 10/17/2018   Procedure: CORONARY ARTERY BYPASS GRAFTING (CABG), ON PUMP, TIMES FOUR, USING LIMA TO LAD, ENDOSCOPICALLY HARVESTED GREATER SAPHENOUS VEIN TO DIAG 2, SVG TO DISTAL RCA, AND LEFT RADIAL TO RAMUS INTERMEDIUS;  Surgeon: [name], MD;  Location: [facility] OR;  Service: Open Heart Surgery;  Laterality: N/A;   CYSTOSCOPY WITH RETROGRADE PYELOGRAM, URETEROSCOPY AND STENT PLACEMENT Right 12/30/2021   Procedure: CYSTOSCOPY WITH RETROGRADE PYELOGRAM, URETEROSCOPY AND STENT PLACEMENT, BASKET OF STONES;  Surgeon: Sebastian Ache, MD;  Location: [facility];  Service: Urology;  Laterality: Right;   CYSTOSCOPY WITH RETROGRADE PYELOGRAM, URETEROSCOPY AND STENT PLACEMENT Left 08/07/2022   Procedure: CYSTOSCOPY WITH RETROGRADE PYELOGRAM, URETEROSCOPY AND STENT PLACEMENT;  Surgeon: Sebastian Ache, MD;  Location: [facility];  Service: Urology;  Laterality: Left;  1 HR   ENDOVEIN HARVEST OF GREATER SAPHENOUS VEIN Right 10/17/2018   Procedure: Mack Guise Of Greater Saphenous Vein;   Surgeon: [name], MD;  Location[facility] OR;  Service: Open Heart Surgery;  Laterality: Right;   HAND SURGERY Left 1987   complex laceration   HOLMIUM LASER APPLICATION Left 08/07/2022   Procedure: HOLMIUM LASER APPLICATION;  Surgeon: Sebastian Ache, MD;  Location: [facility];  Service: Urology;  Laterality: Left;   INGUINAL HERNIA REPAIR Bilateral 05/27/2015   Procedure: LAPAROSCOPIC BILATERAL  INGUINAL HERNIA REPAIRS WITH MESH;  Surgeon: Karie Soda, MD;  Location: [facility];  Service: General;  Laterality: Bilateral;   KNEE ARTHROSCOPY Left 1991   LEFT HEART CATH AND CORONARY ANGIOGRAPHY N/A 10/14/2018   Procedure: LEFT HEART CATH AND CORONARY ANGIOGRAPHY;  Surgeon: Lyn Records, MD;  Location: [facility] IN[facility];  Service: Cardiovascular;  Laterality: N/A;   PENILE PROSTHESIS IMPLANT N/A 10/03/2020   Procedure: PLACEMENT OF PENILE PROTHESIS INFLATABLE;  Surgeon: Marcine Matar, MD;  Location: [facility];  Service: Urology;  Laterality: N/A;  pubis area   RADIAL ARTERY HARVEST Left 10/17/2018   Procedure: RADIAL ARTERY HARVEST;  Surgeon: [name], MD;  Location: [facility] OR;  Service: Open Heart Surgery;  Laterality: Left;   REMOVAL OF PENILE PROSTHESIS N/A 07/07/2021   Procedure: EXPLANT  AND REPLACEMENT OF COLOPLAST TITAN PENILE PROSTHESIS;  Surgeon: Marcine Matar, MD;  Location: [facility];  Service: Urology;  Laterality: N/A;   SCROTAL EXPLORATION N/A 07/07/2021   Procedure: EXPLORATION OF PENILE PROSTHESIS;  Surgeon: Marcine Matar, MD;  Location: [facility];  Service: Urology;  Laterality: N/A;   SHOULDER ARTHROSCOPY Right 07/08/2022   @[facility]  by dr supple;   for impingment   SHOULDER CLOSED REDUCTION Left 09/04/2019   Procedure: CLOSED MANIPULATION SHOULDER;  Surgeon: Dannielle Huh, MD;  Location: [facility];  Service: Orthopedics;  Laterality: Left;   SHOULDER SURGERY Left 2022   TEE WITHOUT CARDIOVERSION N/A 10/17/2018   Procedure: TRANSESOPHAGEAL ECHOCARDIOGRAM  (TEE);  Surgeon: [name], MD;  Location: [facility] OR;  Service: Open Heart Surgery;  Laterality: N/A;    FAMILY HISTORY: Family History  Problem Relation Age of Onset   Hypertension Mother    Cancer Father    Heart failure Father    Sudden death Brother    Heart attack Brother    Hypertension Sister    Heart murmur Sister     SOCIAL HISTORY:  Social History   Socioeconomic History   Marital status: Married    Spouse name: Not on  file   Number of children: Not on file   Years of education: 14   Highest education level: Not on file  Occupational History   Occupation: [employer]  Tobacco Use   Smoking status: Never   Smokeless tobacco: Never  Vaping Use   Vaping Use: Never used  Substance and Sexual Activity   Alcohol use: No   Drug use: Never   Sexual activity: Not on file  Other Topics Concern   Not on file  Social History Narrative   Right handed   Caffeine use: 1 tea/day   Social Determinants of Health   Financial Resource Strain: Not on file  Food Insecurity: No Food Insecurity (08/08/2022)   Hunger Vital Sign    Worried About Running Out of Food in the Last Year: Never true    Ran Out of Food in the Last Year: Never true  Transportation Needs: No Transportation Needs (08/08/2022)   PRAPARE - Administrator, Civil Service (Medical): No    Lack of Transportation (Non-Medical): No  Physical Activity: Not on file  Stress: Not on file  Social Connections: Not on file  Intimate Partner Violence: Not At Risk (08/08/2022)   Humiliation, Afraid, Rape, and Kick questionnaire    Fear of Current or Ex-Partner: No    Emotionally Abused: No    Physically Abused: No    Sexually Abused: No     PHYSICAL EXAM  Vitals:   12/09/22 1353  BP: 132/88  Pulse: 79  Weight: 185 lb 8 oz (84.1 kg)  Height: 5\' 9"  (1.753 m)     Body mass index is 27.39 kg/m.   General: The patient is well-developed and well-nourished and in no acute distress.  Pharynx is  Mallampati 2  Neck:   The neck is nontender   Skin: Extremities are without significant edema.     Musculoskeletal:  Back is nontender  Neurologic Exam  Mental status: The patient is alert and oriented x 3 at the time of the examination. The patient has apparent normal recent and remote memory, with an apparently normal attention span and concentration ability.   Speech is normal.  Cranial nerves: No facial spasms noted.  Extraocular movements are full.  Facial strength and sensation is normal.  Hearing is normal  Motor:  Muscle bulk is normal.   Tone is normal. Strength is  5 / 5 in all 4 extremities.   Sensory: Sensory testing is intact to pinprick, soft touch and vibration sensation in all 4 extremities.  Gait and station: Station is normal.   Gait is normal. Tandem gait is normal. Romberg is negative.        DIAGNOSTIC DATA (LABS, IMAGING, TESTING) - I reviewed patient records, labs, notes, testing and imaging myself where available.  Lab Results  Component Value Date   WBC 9.9 08/12/2022   HGB 13.1 08/12/2022   HCT 41.8 08/12/2022   MCV 87.4 08/12/2022   PLT 217 08/12/2022      Component Value Date/Time   NA 135 08/15/2022 0535   NA 140 07/23/2021 0920   K 3.7 08/15/2022 0535   CL 102 08/15/2022 0535   CO2 24 08/15/2022 0535   GLUCOSE 110 (H) 08/15/2022 0535   BUN 15 08/15/2022 0535   BUN 18 07/23/2021 0920   CREATININE 1.36 (H) 08/15/2022 0535   CREATININE 0.93 12/18/2014 1157   CALCIUM 8.3 (L) 08/15/2022 0535   PROT 6.2 (L) 08/08/2022 0345   PROT 7.0 07/23/2021  0920   ALBUMIN 3.3 (L) 08/08/2022 0345   ALBUMIN 4.5 07/23/2021 0920   AST 19 08/08/2022 0345   ALT 14 08/08/2022 0345   ALKPHOS 46 08/08/2022 0345   BILITOT 0.4 08/08/2022 0345   BILITOT 0.3 07/23/2021 0920   GFRNONAA >60 08/15/2022 0535   GFRNONAA >89 12/18/2014 1157   GFRAA >60 11/09/2019 0854   GFRAA >89 12/18/2014 1157          ASSESSMENT AND PLAN  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    If the Botox stops being effective for the hemifacial spasms, we could consider referral to neurosurgery for the vascular decompression 3.   Dysphagia is unlikely to be related to chemodenervation as no adjacent injection sites and symptoms started > 3 months after injections.    Rtc 3 months or sooner if new or worsening symptoms  Providencia Hottenstein A. Epimenio Foot, MD, [facility] 12/09/2022, 2:33 PM Certified in Neurology, Clinical Neurophysiology, Sleep Medicine, Pain Medicine and Neuroimaging  [facility] [address], [address] [place], [place] [address] [phone]

## 2022-12-21 ENCOUNTER — Ambulatory Visit (INDEPENDENT_AMBULATORY_CARE_PROVIDER_SITE_OTHER): Payer: BC Managed Care – PPO | Admitting: Cardiothoracic Surgery

## 2022-12-21 ENCOUNTER — Encounter: Payer: Self-pay | Admitting: Cardiothoracic Surgery

## 2022-12-21 VITALS — BP 123/84 | HR 80 | Ht 69.0 in | Wt 185.0 lb

## 2022-12-21 DIAGNOSIS — M792 Neuralgia and neuritis, unspecified: Secondary | ICD-10-CM | POA: Diagnosis not present

## 2022-12-21 DIAGNOSIS — Z951 Presence of aortocoronary bypass graft: Secondary | ICD-10-CM

## 2022-12-21 NOTE — Progress Notes (Signed)
HPI: Patient examined, images of CT scan of chest performed 6 months ago personally reviewed and discussed with patient.  Patient had urgent multivessel CABG 4 years ago in 2020 which she says was after a bout of COVID pneumonitis.  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  Postoperative CT scan shows complete healing of the sternal bone and soft tissues.  There is no instability or popping sensation.  Patient is able to adapt to the neuropathic pain using only Tylenol and with avoidance of heavy lifting.  He is disabled from lifting due to the neuropathic pain in the sternotomy incision.  He has not needed Neurontin or narcotics.  Current Outpatient Medications  Medication Sig Dispense Refill   acetaminophen (TYLENOL) 500 MG tablet Take 2 tablets (1,000 mg total) by mouth every 6 (six) hours. (Patient taking differently: Take 1,000 mg by mouth every 6 (six) hours as needed for moderate pain.) 50 tablet 0   aspirin 81 MG tablet Take 1 tablet (81 mg total) by mouth daily.     Bempedoic Acid-Ezetimibe (NEXLIZET) 180-10 MG TABS Take 1 tablet by mouth daily. 90 tablet 3   diclofenac Sodium (VOLTAREN) 1 % GEL Apply 2 g topically 4 (four) times daily. 150 g 0   empagliflozin (JARDIANCE) 25 MG TABS tablet Take 1 tablet (25 mg total) by mouth daily before breakfast. 90 tablet 3   Evolocumab (REPATHA SURECLICK) 140 MG/ML SOAJ Inject 140 mg into the skin every 14 (fourteen) days. 6 mL 3   tamsulosin (FLOMAX) 0.4 MG CAPS capsule Take 0.4 mg by mouth daily.     amLODipine (NORVASC) 10 MG tablet Take 1 tablet (10 mg total) by mouth daily. (Patient taking differently: Take 10 mg by mouth daily.) 90 tablet 3   diazepam (VALIUM) 2 MG tablet Take 1 tablet (2 mg total) by mouth every 12 (twelve) hours. 20 tablet 0   ketorolac (TORADOL) 10 MG tablet Q 8 hours x 2 days then q 8 hours PRN pain 20 tablet 0   oxyCODONE (ROXICODONE) 5 MG immediate release tablet Take 1 tablet (5 mg total) by mouth  every 6 (six) hours as needed for severe pain (post-operatively). 10 tablet 0   senna-docusate (SENOKOT-S) 8.6-50 MG tablet Take 1 tablet by mouth 2 (two) times daily. While taking strongest pain meds to prevent constipation 10 tablet 0   Current Facility-Administered Medications  Medication Dose Route Frequency Provider Last Rate Last Admin   incobotulinumtoxinA (XEOMIN) 100 units injection 70 Units  70 Units Intramuscular Q90 days Asa Lente, MD   70 Units at 12/09/22 1438     Physical Exam: Vitals:   12/21/22 1008  BP: 123/84  Pulse: 80  SpO2: (!) 20%         Exam    General- alert and comfortable.  Tenderness along the sternotomy incision without structural deformity.  No edema or induration.    Neck- no JVD, no cervical adenopathy palpable, no carotid bruit   Lungs- clear without rales, wheezes   Cor- regular rate and rhythm, no murmur , gallop   Abdomen- soft, non-tender   Extremities - warm, non-tender, minimal edema   Neuro- oriented, appropriate, no focal weakness    Diagnostic Tests: CT scan with contrast performed 6 months ago personally reviewed and discussed with patient showing an intact sternotomy incision.  Pulmonary windows show some bilateral thickening which could be related to his prior COVID pneumonitis.    He denies any pulmonary symptoms now and lungs  are clear on exam.  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 Tyrone Sage  Plan: Continue current treatment with avoidance of heavy lifting and Tylenol as needed for pain.  Patient is disabled from doing any lifting.  Return for follow-up assessment in 6 months.   Lovett Sox, MD [facility] [phone]

## 2022-12-25 ENCOUNTER — Ambulatory Visit: Payer: BC Managed Care – PPO | Attending: Family Medicine

## 2022-12-28 ENCOUNTER — Ambulatory Visit: Payer: BC Managed Care – PPO

## 2022-12-28 NOTE — Addendum Note (Signed)
Addended by: Nayeli Calvert E on: 12/28/2022 08:06 AM   Modules accepted: Orders

## 2022-12-29 ENCOUNTER — Telehealth: Payer: Self-pay | Admitting: Pharmacist

## 2022-12-29 ENCOUNTER — Ambulatory Visit: Payer: BC Managed Care – PPO | Attending: Cardiology

## 2022-12-29 DIAGNOSIS — I2510 Atherosclerotic heart disease of native coronary artery without angina pectoris: Secondary | ICD-10-CM

## 2022-12-29 NOTE — Telephone Encounter (Signed)
Patient missed lipid labs last Friday. I called him yesterday and he stated he would come right in as he hadn't eaten yet. Patient didn't show for these labs. I called him again, he states he got busy yesterday and is on his way now. Advised him that orders need to be placed for the specific date he is to have labs checked. I have moved lab appt again to today.

## 2022-12-30 LAB — COMPREHENSIVE METABOLIC PANEL
ALT: 13 IU/L (ref 0–44)
AST: 18 IU/L (ref 0–40)
Albumin: 4.3 g/dL (ref 3.8–4.9)
Alkaline Phosphatase: 61 IU/L (ref 44–121)
BUN/Creatinine Ratio: 9 (ref 9–20)
BUN: 12 mg/dL (ref 6–24)
Bilirubin Total: 0.3 mg/dL (ref 0.0–1.2)
CO2: 23 mmol/L (ref 20–29)
Calcium: 8.8 mg/dL (ref 8.7–10.2)
Chloride: 107 mmol/L — ABNORMAL HIGH (ref 96–106)
Creatinine, Ser: 1.36 mg/dL — ABNORMAL HIGH (ref 0.76–1.27)
Globulin, Total: 2.5 g/dL (ref 1.5–4.5)
Glucose: 97 mg/dL (ref 70–99)
Potassium: 4.1 mmol/L (ref 3.5–5.2)
Sodium: 140 mmol/L (ref 134–144)
Total Protein: 6.8 g/dL (ref 6.0–8.5)
eGFR: 62 mL/min/{1.73_m2} (ref >59)

## 2022-12-30 LAB — LIPID PANEL
Chol/HDL Ratio: 2 ratio (ref 0.0–5.0)
Cholesterol, Total: 122 mg/dL (ref 100–199)
HDL: 62 mg/dL (ref >39)
LDL Chol Calc (NIH): 47 mg/dL (ref 0–99)
Triglycerides: 58 mg/dL (ref 0–149)
VLDL Cholesterol Cal: 13 mg/dL (ref 5–40)

## 2023-01-27 ENCOUNTER — Ambulatory Visit: Payer: BC Managed Care – PPO | Attending: Cardiovascular Disease | Admitting: Cardiovascular Disease

## 2023-01-27 ENCOUNTER — Encounter: Payer: Self-pay | Admitting: Cardiovascular Disease

## 2023-01-27 NOTE — Progress Notes (Deleted)
Cardiology Office Note:    Date:  01/27/2023   ID:  Danny Brewer, DOB [birth year], MRN [id number]  PCP:  Jackelyn Poling, DO   [facility] Providers Cardiologist:  Tonny Bollman, MD Cardiology APP:  Kennon Rounds     Referring MD: Jackelyn Poling, DO   No chief complaint on file.   History of Present Illness:    Danny Brewer is a 52 y.o. male with a hx of:  Coronary artery disease  s/p CABG 10/2018 Echocardiogram 09/19/18: EF 60-65 Intol of beta-blocker  Myoview 03/20/2022: EF 48, normal perfusion, low risk Echo 03/25/2022 EF 60-65, no RWMA, normal RVSF, trivial MR Diabetes mellitus 2 Hypertension  Hyperlipidemia  Intol of statins >> Rx w Alirocumab (Praluent) Carotid stenosis  [place] 10/21/20: Bilateral ICA 1-39 [place] 03/21/22: no ICA stenosis Bilat ED  Nephrolithiasis (s/p stone extraction in July 2023)   Past Medical History:  Diagnosis Date   BPH (benign prostatic hyperplasia)    Coronary Artery Disease    cardiologist--- dr Excell Seltzer;    Coronary CTA 09/2018: pLAD 70-90; pD1+pD2 70-90; mRCA mixed plaque - cannot rule out 70-90; FFR suggests hemodynamically significant stenosis // LHC 10/2018: 2v CAD >> s/p  CABG (L-LAD, L radial-RI, S-D2, S-RCA) with Dr. 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   LIMA--LAD;  SVG--D2;  SVG--dRCA;  left radial --- RI   Type 2 diabetes mellitus (HCC)    followed by pcp   (08-05-2022 checks blood sugar daily am fasting,  fasting sugar-- 77-100)   Wears glasses     Past Surgical History:  Procedure Laterality Date   CORONARY ARTERY BYPASS GRAFT N/A 10/17/2018   Procedure: CORONARY ARTERY BYPASS GRAFTING (CABG), ON PUMP, TIMES FOUR, USING LIMA TO LAD,  ENDOSCOPICALLY HARVESTED GREATER SAPHENOUS VEIN TO DIAG 2, SVG TO DISTAL RCA, AND LEFT RADIAL TO RAMUS INTERMEDIUS;  Surgeon: [name], MD;  Location: [facility] OR;  Service: Open Heart Surgery;  Laterality: N/A;   CYSTOSCOPY WITH RETROGRADE PYELOGRAM, URETEROSCOPY AND STENT PLACEMENT Right 12/30/2021   Procedure: CYSTOSCOPY WITH RETROGRADE PYELOGRAM, URETEROSCOPY AND STENT PLACEMENT, BASKET OF STONES;  Surgeon: Sebastian Ache, MD;  Location: [facility];  Service: Urology;  Laterality: Right;   CYSTOSCOPY WITH RETROGRADE PYELOGRAM, URETEROSCOPY AND STENT PLACEMENT Left 08/07/2022   Procedure: CYSTOSCOPY WITH RETROGRADE PYELOGRAM, URETEROSCOPY AND STENT PLACEMENT;  Surgeon: Sebastian Ache, MD;  Location: [facility];  Service: Urology;  Laterality: Left;  1 HR   ENDOVEIN HARVEST OF GREATER SAPHENOUS VEIN Right 10/17/2018   Procedure: Mack Guise Of Greater Saphenous Vein;  Surgeon: [name], MD;  Location[facility] OR;  Service: Open Heart Surgery;  Laterality: Right;   HAND SURGERY Left 1987   complex laceration   HOLMIUM LASER APPLICATION Left 08/07/2022   Procedure: HOLMIUM LASER APPLICATION;  Surgeon: Sebastian Ache, MD;  Location: [facility];  Service: Urology;  Laterality: Left;   INGUINAL HERNIA REPAIR Bilateral 05/27/2015   Procedure: LAPAROSCOPIC BILATERAL  INGUINAL HERNIA REPAIRS WITH MESH;  Surgeon: Karie Soda, MD;  Location: [facility];  Service: General;  Laterality: Bilateral;   KNEE ARTHROSCOPY Left 1991  LEFT HEART CATH AND CORONARY ANGIOGRAPHY N/A 10/14/2018   Procedure: LEFT HEART CATH AND CORONARY ANGIOGRAPHY;  Surgeon: Lyn Records, MD;  Location: [facility] IN[facility];  Service: Cardiovascular;  Laterality: N/A;   PENILE PROSTHESIS IMPLANT N/A 10/03/2020   Procedure: PLACEMENT OF PENILE PROTHESIS INFLATABLE;  Surgeon: Marcine Matar, MD;  Location: [facility];  Service: Urology;  Laterality: N/A;  pubis area   RADIAL ARTERY HARVEST Left 10/17/2018   Procedure:  RADIAL ARTERY HARVEST;  Surgeon: [name], MD;  Location: [facility] OR;  Service: Open Heart Surgery;  Laterality: Left;   REMOVAL OF PENILE PROSTHESIS N/A 07/07/2021   Procedure: EXPLANT  AND REPLACEMENT OF COLOPLAST TITAN PENILE PROSTHESIS;  Surgeon: Marcine Matar, MD;  Location: [facility];  Service: Urology;  Laterality: N/A;   SCROTAL EXPLORATION N/A 07/07/2021   Procedure: EXPLORATION OF PENILE PROSTHESIS;  Surgeon: Marcine Matar, MD;  Location: [facility];  Service: Urology;  Laterality: N/A;   SHOULDER ARTHROSCOPY Right 07/08/2022   @[facility]  by dr supple;   for impingment   SHOULDER CLOSED REDUCTION Left 09/04/2019   Procedure: CLOSED MANIPULATION SHOULDER;  Surgeon: Dannielle Huh, MD;  Location: [facility];  Service: Orthopedics;  Laterality: Left;   SHOULDER SURGERY Left 2022   TEE WITHOUT CARDIOVERSION N/A 10/17/2018   Procedure: TRANSESOPHAGEAL ECHOCARDIOGRAM (TEE);  Surgeon: [name], MD;  Location[facility] OR;  Service: Open Heart Surgery;  Laterality: N/A;    Current Medications: No outpatient medications have been marked as taking for the 01/27/23 encounter (Appointment) with Tonny Bollman, MD.   Current Facility-Administered Medications for the 01/27/23 encounter (Appointment) with Tonny Bollman, MD  Medication   incobotulinumtoxinA (XEOMIN) 100 units injection 70 Units     Allergies:   Antihistamines, diphenhydramine-type; Bactrim [sulfamethoxazole-trimethoprim]; Demerol; Morphine and codeine; Compazine [prochlorperazine]; Metoprolol; Oxycodone; and Statins   Social History   Socioeconomic History   Marital status: Married    Spouse name: Not on file   Number of children: Not on file   Years of education: 14   Highest education level: Not on file  Occupational History   Occupation: [employer]  Tobacco Use   Smoking status: Never   Smokeless tobacco: Never  Vaping Use   Vaping status: Never Used  Substance and Sexual Activity   Alcohol use: No   Drug use:  Never   Sexual activity: Not on file  Other Topics Concern   Not on file  Social History Narrative   Right handed   Caffeine use: 1 tea/day   Social Determinants of Health   Financial Resource Strain: Not on file  Food Insecurity: No Food Insecurity (08/08/2022)   Hunger Vital Sign    Worried About Running Out of Food in the Last Year: Never true    Ran Out of Food in the Last Year: Never true  Transportation Needs: No Transportation Needs (08/08/2022)   PRAPARE - Administrator, Civil Service (Medical): No    Lack of Transportation (Non-Medical): No  Physical Activity: Not on file  Stress: Not on file  Social Connections: Not on file     Family History: The patient's family history includes Cancer in his father; Heart attack in his brother; Heart failure in his father; Heart murmur in his sister; Hypertension in his mother and sister; Sudden death in his brother.  ROS:   Please see the history of present illness.    All other systems reviewed and are negative.  EKGs/Labs/Other Studies Reviewed:  The following studies were reviewed today: Myoview Scan 03/20/2022:   The study is normal. The study is low risk.   No ST deviation was noted.   LV perfusion is normal. There is no evidence of ischemia. There is no evidence of infarction.   Left ventricular function is abnormal. Global function is mildly reduced. Nuclear stress EF: 48 %. The left ventricular ejection fraction is mildly decreased (45-54%). End diastolic cavity size is normal. End systolic cavity size is normal.   Prior study not available for comparison.   Normal perfusion Mild systolic dysfunction (EF 48%) with septal hypokinesis.  Consider echocardiogram for further evaluation Low risk study     Echo 03-25-2022: 1. Left ventricular ejection fraction, by estimation, is 60 to 65%. The  left ventricle has normal function. The left ventricle has no regional  wall motion abnormalities. Left ventricular  diastolic parameters were  normal.   2. Right ventricular systolic function is normal. The right ventricular  size is normal.   3. The mitral valve is normal in structure. Trivial mitral valve  regurgitation.   4. The aortic valve is tricuspid. Aortic valve regurgitation is not  visualized.   Recent Labs: 08/08/2022: B Natriuretic Peptide 33.0 08/12/2022: Hemoglobin 13.1; Platelets 217 12/29/2022: ALT 13; BUN 12; Creatinine, Ser 1.36; Potassium 4.1; Sodium 140  Recent Lipid Panel    Component Value Date/Time   CHOL 122 12/29/2022 1022   TRIG 58 12/29/2022 1022   HDL 62 12/29/2022 1022   CHOLHDL 2.0 12/29/2022 1022   CHOLHDL 4.3 09/19/2018 0243   VLDL 13 09/19/2018 0243   LDLCALC 47 12/29/2022 1022     Risk Assessment/Calculations:      No BP recorded.  {Refresh Note OR Click here to enter BP  :1}***         Physical Exam:    VS:  There were no vitals taken for this visit.    Wt Readings from Last 3 Encounters:  12/21/22 185 lb (83.9 kg)  12/09/22 185 lb 8 oz (84.1 kg)  09/03/22 190 lb (86.2 kg)     GEN: *** Well nourished, well developed in no acute distress HEENT: Normal NECK: No JVD; No carotid bruits LYMPHATICS: No lymphadenopathy CARDIAC: ***RRR, no murmurs, rubs, gallops RESPIRATORY:  Clear to auscultation without rales, wheezing or rhonchi  ABDOMEN: Soft, non-tender, non-distended MUSCULOSKELETAL:  No edema; No deformity  SKIN: Warm and dry NEUROLOGIC:  Alert and oriented x 3 PSYCHIATRIC:  Normal affect   ASSESSMENT:    1. Coronary artery disease involving native coronary artery of native heart without angina pectoris   2. Type 2 diabetes mellitus without complication, without long-term current use of insulin (HCC)   3. Hyperlipidemia LDL goal <70   4. Essential hypertension    PLAN:    In order of problems listed above:  Most recent Myoview scan reviewed as above, low-risk with no significant ischemia. Now 4 years out from multivessel CABG.   Managed by PCP.  Treated with high-intensity statin. LDL 47, LFT's normal. Continue same Rx.  BP ***      {Are you ordering a CV Procedure (e.g. stress test, cath, DCCV, TEE, etc)?   Press F2        :[id number]}    Medication Adjustments/Labs and Tests Ordered: Current medicines are reviewed at length with the patient today.  Concerns regarding medicines are outlined above.  No orders of the defined types were placed in this encounter.  No orders of the defined types were placed  in this encounter.   There are no Patient Instructions on file for this visit.   Signed, Tonny Bollman, MD  01/27/2023 8:03 AM    [facility]

## 2023-02-13 ENCOUNTER — Emergency Department (HOSPITAL_COMMUNITY): Payer: BC Managed Care – PPO

## 2023-02-13 ENCOUNTER — Observation Stay (HOSPITAL_COMMUNITY)
Admission: EM | Admit: 2023-02-13 | Discharge: 2023-02-15 | Disposition: A | Discharge status: Home / Self Care | Payer: BC Managed Care – PPO | Attending: Emergency Medicine | Admitting: Emergency Medicine

## 2023-02-13 ENCOUNTER — Encounter (HOSPITAL_COMMUNITY): Payer: Self-pay

## 2023-02-13 ENCOUNTER — Other Ambulatory Visit: Payer: Self-pay

## 2023-02-13 DIAGNOSIS — E1122 Type 2 diabetes mellitus with diabetic chronic kidney disease: Secondary | ICD-10-CM | POA: Diagnosis not present

## 2023-02-13 DIAGNOSIS — N1831 Chronic kidney disease, stage 3a: Secondary | ICD-10-CM | POA: Insufficient documentation

## 2023-02-13 DIAGNOSIS — R109 Unspecified abdominal pain: Secondary | ICD-10-CM | POA: Diagnosis present

## 2023-02-13 DIAGNOSIS — Z951 Presence of aortocoronary bypass graft: Secondary | ICD-10-CM | POA: Diagnosis not present

## 2023-02-13 DIAGNOSIS — R1031 Right lower quadrant pain: Secondary | ICD-10-CM | POA: Diagnosis present

## 2023-02-13 DIAGNOSIS — I129 Hypertensive chronic kidney disease with stage 1 through stage 4 chronic kidney disease, or unspecified chronic kidney disease: Secondary | ICD-10-CM | POA: Diagnosis not present

## 2023-02-13 DIAGNOSIS — Z7982 Long term (current) use of aspirin: Secondary | ICD-10-CM | POA: Insufficient documentation

## 2023-02-13 DIAGNOSIS — N400 Enlarged prostate without lower urinary tract symptoms: Secondary | ICD-10-CM | POA: Diagnosis not present

## 2023-02-13 DIAGNOSIS — I2510 Atherosclerotic heart disease of native coronary artery without angina pectoris: Secondary | ICD-10-CM | POA: Insufficient documentation

## 2023-02-13 LAB — CBC WITH DIFFERENTIAL/PLATELET
Abs Immature Granulocytes: 0.02 10*3/uL (ref 0.00–0.07)
Basophils Absolute: 0.1 10*3/uL (ref 0.0–0.1)
Basophils Relative: 1 %
Eosinophils Absolute: 0.2 10*3/uL (ref 0.0–0.5)
Eosinophils Relative: 2 %
HCT: 51.8 % (ref 39.0–52.0)
Hemoglobin: 16.2 g/dL (ref 13.0–17.0)
Immature Granulocytes: 0 %
Lymphocytes Relative: 16 %
Lymphs Abs: 1.5 10*3/uL (ref 0.7–4.0)
MCH: 27.8 pg (ref 26.0–34.0)
MCHC: 31.3 g/dL (ref 30.0–36.0)
MCV: 88.9 fL (ref 80.0–100.0)
Monocytes Absolute: 1.1 10*3/uL — ABNORMAL HIGH (ref 0.1–1.0)
Monocytes Relative: 11 %
Neutro Abs: 6.9 10*3/uL (ref 1.7–7.7)
Neutrophils Relative %: 70 %
Platelets: 230 10*3/uL (ref 150–400)
RBC: 5.83 MIL/uL — ABNORMAL HIGH (ref 4.22–5.81)
RDW: 13.2 % (ref 11.5–15.5)
WBC: 9.8 10*3/uL (ref 4.0–10.5)
nRBC: 0 % (ref 0.0–0.2)

## 2023-02-13 LAB — URINALYSIS, ROUTINE W REFLEX MICROSCOPIC
Bilirubin Urine: NEGATIVE
Glucose, UA: NEGATIVE mg/dL
Hgb urine dipstick: NEGATIVE
Ketones, ur: 5 mg/dL — AB
Leukocytes,Ua: NEGATIVE
Nitrite: NEGATIVE
Protein, ur: 30 mg/dL — AB
Specific Gravity, Urine: 1.036 — ABNORMAL HIGH (ref 1.005–1.030)
pH: 5 (ref 5.0–8.0)

## 2023-02-13 LAB — COMPREHENSIVE METABOLIC PANEL
ALT: 20 U/L (ref 0–44)
AST: 25 U/L (ref 15–41)
Albumin: 4 g/dL (ref 3.5–5.0)
Alkaline Phosphatase: 55 U/L (ref 38–126)
Anion gap: 11 (ref 5–15)
BUN: 10 mg/dL (ref 6–20)
CO2: 25 mmol/L (ref 22–32)
Calcium: 9.3 mg/dL (ref 8.9–10.3)
Chloride: 102 mmol/L (ref 98–111)
Creatinine, Ser: 1.44 mg/dL — ABNORMAL HIGH (ref 0.61–1.24)
GFR, Estimated: 58 mL/min — ABNORMAL LOW (ref >60)
Glucose, Bld: 89 mg/dL (ref 70–99)
Potassium: 4.5 mmol/L (ref 3.5–5.1)
Sodium: 138 mmol/L (ref 135–145)
Total Bilirubin: 0.7 mg/dL (ref 0.3–1.2)
Total Protein: 7.7 g/dL (ref 6.5–8.1)

## 2023-02-13 LAB — LIPASE, BLOOD: Lipase: 19 U/L (ref 11–51)

## 2023-02-13 MED ORDER — POLYETHYLENE GLYCOL 3350 17 G PO PACK: 17.0000 g | PACK | Freq: Every day | ORAL | Status: DC | PRN

## 2023-02-13 MED ORDER — ACETAMINOPHEN 325 MG PO TABS: 650.0000 mg | ORAL_TABLET | Freq: Four times a day (QID) | ORAL | Status: DC | PRN

## 2023-02-13 MED ORDER — HYDROMORPHONE HCL 1 MG/ML IJ SOLN
0.5000 mg | Freq: Once | INTRAMUSCULAR | Status: AC
  Administered 2023-02-13: 0.5 mg via INTRAVENOUS
  Filled 2023-02-13: qty 1

## 2023-02-13 MED ORDER — SODIUM CHLORIDE 0.9 % IV SOLN: INTRAVENOUS | Status: AC

## 2023-02-13 MED ORDER — DICLOFENAC SODIUM 1 % EX GEL
2.0000 g | Freq: Four times a day (QID) | CUTANEOUS | Status: DC
  Filled 2023-02-13: qty 100

## 2023-02-13 MED ORDER — ONDANSETRON HCL 4 MG/2ML IJ SOLN
4.0000 mg | Freq: Once | INTRAMUSCULAR | Status: AC
  Administered 2023-02-13: 4 mg via INTRAVENOUS
  Filled 2023-02-13: qty 2

## 2023-02-13 MED ORDER — SODIUM CHLORIDE 0.9% FLUSH: 3.0000 mL | INTRAVENOUS | Status: DC | PRN

## 2023-02-13 MED ORDER — HYDROMORPHONE HCL 1 MG/ML IJ SOLN
1.0000 mg | Freq: Once | INTRAMUSCULAR | Status: AC
  Administered 2023-02-13: 1 mg via INTRAVENOUS
  Filled 2023-02-13: qty 1

## 2023-02-13 MED ORDER — HYDROMORPHONE HCL 1 MG/ML IJ SOLN
0.5000 mg | INTRAMUSCULAR | Status: DC | PRN
  Administered 2023-02-13 – 2023-02-15 (×4): 1 mg via INTRAVENOUS
  Filled 2023-02-13 (×4): qty 1

## 2023-02-13 MED ORDER — SODIUM CHLORIDE 0.9% FLUSH
3.0000 mL | Freq: Two times a day (BID) | INTRAVENOUS | Status: DC
  Administered 2023-02-13 – 2023-02-14 (×2): 3 mL via INTRAVENOUS

## 2023-02-13 MED ORDER — BISACODYL 5 MG PO TBEC
10.0000 mg | DELAYED_RELEASE_TABLET | Freq: Every day | ORAL | Status: DC | PRN
  Administered 2023-02-13: 10 mg via ORAL
  Filled 2023-02-13: qty 2

## 2023-02-13 MED ORDER — ASPIRIN 81 MG PO TBEC
81.0000 mg | DELAYED_RELEASE_TABLET | Freq: Every day | ORAL | Status: DC
  Administered 2023-02-13 – 2023-02-15 (×3): 81 mg via ORAL
  Filled 2023-02-13 (×3): qty 1

## 2023-02-13 MED ORDER — ONDANSETRON HCL 4 MG PO TABS: 4.0000 mg | ORAL_TABLET | Freq: Four times a day (QID) | ORAL | Status: DC | PRN

## 2023-02-13 MED ORDER — ENOXAPARIN SODIUM 40 MG/0.4ML IJ SOSY
40.0000 mg | PREFILLED_SYRINGE | INTRAMUSCULAR | Status: DC
  Administered 2023-02-13: 40 mg via SUBCUTANEOUS
  Filled 2023-02-13: qty 0.4

## 2023-02-13 MED ORDER — SODIUM CHLORIDE 0.9 % IV SOLN: 250.0000 mL | INTRAVENOUS | Status: DC | PRN

## 2023-02-13 MED ORDER — ACETAMINOPHEN 650 MG RE SUPP: 650.0000 mg | Freq: Four times a day (QID) | RECTAL | Status: DC | PRN

## 2023-02-13 MED ORDER — ONDANSETRON HCL 4 MG/2ML IJ SOLN: 4.0000 mg | Freq: Four times a day (QID) | INTRAMUSCULAR | Status: DC | PRN

## 2023-02-13 MED ORDER — GABAPENTIN 100 MG PO CAPS
200.0000 mg | ORAL_CAPSULE | Freq: Three times a day (TID) | ORAL | Status: DC
  Administered 2023-02-13 – 2023-02-15 (×5): 200 mg via ORAL
  Filled 2023-02-13 (×5): qty 2

## 2023-02-13 MED ORDER — SODIUM CHLORIDE 0.9% FLUSH
3.0000 mL | Freq: Two times a day (BID) | INTRAVENOUS | Status: DC
  Administered 2023-02-13 – 2023-02-15 (×4): 3 mL via INTRAVENOUS

## 2023-02-13 MED ORDER — IOHEXOL 350 MG/ML SOLN
75.0000 mL | Freq: Once | INTRAVENOUS | Status: AC | PRN
  Administered 2023-02-13: 75 mL via INTRAVENOUS

## 2023-02-13 MED ORDER — LIDOCAINE 5 % EX PTCH
1.0000 | MEDICATED_PATCH | CUTANEOUS | Status: DC
  Administered 2023-02-13: 1 via TRANSDERMAL
  Filled 2023-02-13: qty 1

## 2023-02-13 NOTE — ED Provider Triage Note (Signed)
Emergency Medicine Provider Triage Evaluation Note  Nilay Nikirk , a 52 y.o. male  was evaluated in triage.  Pt complains of right lower quadrant abdominal pain since yesterday evening.  Endorses nausea and vomiting. Endorses fever.  Denies any urinary symptoms, bowel changes, blood in his stool or urine.  Review of Systems  Positive: As above  Negative: As above  Physical Exam  BP (!) 125/99 (BP Location: Right Arm)   Pulse (!) 102   Temp 98.4 F (36.9 C) (Oral)   Resp 20   Ht 5\' 9"  (1.753 m)   Wt 79.8 kg   SpO2 100%   BMI 25.99 kg/m  Gen:   Awake, no distress   Resp:  Normal effort  MSK:   Moves extremities without difficulty  Other:  Tenderness to palpation to right lower quadrant.  Medical Decision Making  Medically screening exam initiated at 1:39 PM.  Appropriate orders placed.  Izaha Orosco was informed that the remainder of the evaluation will be completed by another provider, this initial triage assessment does not replace that evaluation, and the importance of remaining in the ED until their evaluation is complete.    Jeanelle Malling, [place] 02/13/23 1341

## 2023-02-13 NOTE — ED Triage Notes (Signed)
Pt arrived POV from home c/o RLQ abdominal pain that started yesterday evening and has gradually gotten worse. Pt was sent from his PCP for concern of appendicitis.

## 2023-02-13 NOTE — ED Provider Notes (Signed)
[place] EMERGENCY DEPARTMENT AT [facility] Provider Note   CSN: [id number] Arrival date & time: 02/13/23  1257     History  Chief Complaint  Patient presents with   Abdominal Pain    Danny Brewer is a 52 y.o. male with PMH as listed below who presents with gradual onset severe constant right lower quadrant abdominal pain.  He had this happen 7 months ago and it went away.  Endorses nausea with no vomiting and anorexia.  Has had fevers and chills/sweats at home.  He has had mild dysuria but denies any hematuria, diarrhea, constipation, hematochezia, melena.  Has a history of a ureteral stent placement for kidney stones in the past but he states this does not feel like a kidney stone.  Also has a history of penile implant placement 2 years ago for ED, has not had any difficulty urinating or any complications from the implant.  Past Medical History:  Diagnosis Date   BPH (benign prostatic hyperplasia)    Coronary Artery Disease    cardiologist--- dr Excell Seltzer;    Coronary CTA 09/2018: pLAD 70-90; pD1+pD2 70-90; mRCA mixed plaque - cannot rule out 70-90; FFR suggests hemodynamically significant stenosis // LHC 10/2018: 2v CAD >> s/p  CABG (L-LAD, L radial-RI, S-D2, S-RCA) with Dr. 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 Authorizing Provider  acetaminophen (TYLENOL) 500 MG tablet Take 2 tablets (1,000 mg total) by  mouth every 6 (six) hours. Patient taking differently: Take 1,000 mg by mouth every 6 (six) hours as needed for moderate pain. 07/08/21   Marcine Matar, MD  aspirin 81 MG tablet Take 1 tablet (81 mg total) by mouth daily. 10/11/18   Alben Spittle, Scott T, PA-C  Bempedoic Acid-Ezetimibe (NEXLIZET) 180-10 MG TABS Take 1 tablet by mouth daily. 11/17/22   Tonny Bollman, MD  diazepam (VALIUM) 2 MG tablet Take 1 tablet (2 mg total) by mouth every 12 (twelve) hours. 08/15/22   Joseph Art, DO  diclofenac Sodium (VOLTAREN) 1 % GEL Apply 2 g topically 4 (four) times daily. 08/15/22   Joseph Art, DO  empagliflozin (JARDIANCE) 25 MG TABS tablet Take 1 tablet (25 mg total) by mouth daily before breakfast. 11/16/22   Tonny Bollman, MD  Evolocumab (REPATHA SURECLICK) 140 MG/ML SOAJ Inject 140 mg into the skin every 14 (fourteen) days. 11/17/22   Tonny Bollman, MD  senna-docusate (SENOKOT-S) 8.6-50 MG tablet Take 1 tablet by mouth 2 (two) times daily. While taking strongest pain meds to prevent constipation 08/07/22   Berneice Heinrich Delbert Phenix., MD  tamsulosin [facility]) 0.4 MG CAPS capsule Take 0.4 mg by mouth daily.    [provider]      Allergies    Antihistamines, diphenhydramine-type; Bactrim [sulfamethoxazole-trimethoprim]; Demerol;  Morphine and codeine; Compazine [prochlorperazine]; Metoprolol; Oxycodone; and Statins    Review of Systems   Review of Systems A 10 point review of systems was performed and is negative unless otherwise reported in HPI.  Physical Exam Updated Vital Signs BP (!) 124/108   Pulse 89   Temp 99.4 F (37.4 C) (Oral)   Resp 20   Ht 5\' 9"  (1.753 m)   Wt 79.8 kg   SpO2 98%   BMI 25.99 kg/m  Physical Exam General: Normal appearing male, lying in bed.  HEENT: Sclera anicteric, MMM, trachea midline.  Cardiology: RRR, no murmurs/rubs/gallops. DP pulses equal bilaterally.  Resp: Normal respiratory rate and effort. CTAB, no wheezes, rhonchi, crackles.  Abd: Soft,  non-distended. +TTP in RLQ and suprapubic region with guarding.  GU: Deferred. MSK: No peripheral edema or signs of trauma. Extremities without deformity or TTP. No cyanosis or clubbing. Skin: warm, dry.  Back: No CVA tenderness Neuro: A&Ox4, CNs II-XII grossly intact. MAEs. Sensation grossly intact.  Psych: Normal mood and affect.   ED Results / Procedures / Treatments   Labs (all labs ordered are listed, but only abnormal results are displayed) Labs Reviewed  COMPREHENSIVE METABOLIC PANEL - Abnormal; Notable for the following components:      Result Value   Creatinine, Ser 1.44 (*)    GFR, Estimated 58 (*)    All other components within normal limits  CBC WITH DIFFERENTIAL/PLATELET - Abnormal; Notable for the following components:   RBC 5.83 (*)    Monocytes Absolute 1.1 (*)    All other components within normal limits  URINALYSIS, ROUTINE W REFLEX MICROSCOPIC - Abnormal; Notable for the following components:   Specific Gravity, Urine 1.036 (*)    Ketones, ur 5 (*)    Protein, ur 30 (*)    Bacteria, UA RARE (*)    All other components within normal limits  LIPASE, BLOOD    EKG None  Radiology CT ABDOMEN PELVIS W CONTRAST  Result Date: 02/13/2023 CLINICAL DATA:  Right lower quadrant pain. EXAM: CT ABDOMEN AND PELVIS WITH CONTRAST TECHNIQUE: Multidetector CT imaging of the abdomen and pelvis was performed using the standard protocol following bolus administration of intravenous contrast. RADIATION DOSE REDUCTION: This exam was performed according to the departmental dose-optimization program which includes automated exposure control, adjustment of the mA and/or kV according to patient size and/or use of iterative reconstruction technique. CONTRAST:  75mL OMNIPAQUE IOHEXOL 350 MG/ML SOLN COMPARISON:  08/11/2022 FINDINGS: Lower Chest: No acute findings. Hepatobiliary: No suspicious hepatic masses identified. Gallbladder is unremarkable. No evidence of biliary ductal dilatation.  Pancreas:  No mass or inflammatory changes. Spleen: Within normal limits in size and appearance. Adrenals/Urinary Tract: No suspicious masses identified. Left renal calculi are again noted, largest in the upper pole measuring 8 mm. No evidence of ureteral calculi or hydronephrosis. Unremarkable unopacified urinary bladder. Stomach/Bowel: No evidence of obstruction, inflammatory process or abnormal fluid collections. Although the appendix is not directly visualized, no inflammatory process seen in region of the cecum or elsewhere. Vascular/Lymphatic: No pathologically enlarged lymph nodes. No acute vascular findings. Reproductive: No mass or other significant abnormality. Penile prosthesis reservoir noted in right lower quadrant. Other:  None. Musculoskeletal:  No suspicious bone lesions identified. IMPRESSION: No acute findings within the abdomen or pelvis. Left nephrolithiasis. No evidence of ureteral calculi or hydronephrosis. Electronically Signed   By: Danae Orleans M.D.   On: 02/13/2023 16:55    Procedures Procedures    Medications Ordered in ED Medications  HYDROmorphone (DILAUDID) injection 0.5 mg (0.5 mg Intravenous Given 02/13/23 1353)  ondansetron (ZOFRAN) injection 4 mg (4 mg Intravenous Given 02/13/23 1353)  HYDROmorphone (DILAUDID) injection 0.5 mg (0.5 mg Intravenous Given 02/13/23 1620)  iohexol (OMNIPAQUE) 350 MG/ML injection 75 mL (75 mLs Intravenous Contrast Given 02/13/23 1601)  HYDROmorphone (DILAUDID) injection 1 mg (1 mg Intravenous Given 02/13/23 1913)    ED Course/ Medical Decision Making/ A&P                          Medical Decision Making Amount and/or Complexity of Data Reviewed Radiology:  Decision-making details documented in ED Course.  Risk Prescription drug management. Decision regarding hospitalization.    This patient presents to the ED for concern of RLQ abd pain, N, f/c; this involves an extensive number of treatment options, and is a complaint that carries with  it a high risk of complications and morbidity.  I considered the following differential and admission for this acute, potentially life threatening condition.    MDM:    For DDX for abdominal pain includes but is not limited to:  Abdominal exam with peritoneal signs  In the right lower quadrant.  Greatest concern for appendicitis or perforated appendicitis and will send patient for CT scan immediately.  Also consider urolithiasis or obstructive uropathy given patient's history of urolithiasis requiring stent placement, however patient states this does not feel like that.  He is afebrile here but reportedly had fevers at home and is mildly tachycardic, however his CMP does not demonstrate any leukocytosis and he does not meet SIRS criteria for sepsis. Low suspicion for acute hepatobiliary disease (including acute cholecystitis or cholangitis), acute pancreatitis (neg lipase). Also considered but lower on differential are vascular catastrophe, bowel obstruction, viscus perforation, diverticulitis.    Clinical Course as of 02/13/23 1919  Sat Feb 13, 2023  1521 CBC, lipase, CMP unremarkable in the context of this patient's presentation.  Creatinine is near his baseline level. [HN]  1745 CT ABDOMEN PELVIS W CONTRAST No abnormalities on CT. Unchanged since his last CT in March. Penile implant and reservoir appear normal. [HN]  1746 For imaging negative abdominal pain, patient has no hypercalcemia, DKA/AKA, uremia or e/o adrenal crisis. No zoster on skin of abdomen. No epigastric/LUQ pain to indicate gastritis/PUD.  [HN]  1835 Reevaluated patient. He is having extreme pain in RLQ, with peritoneal signs on exam, pain with movement. Called radiology to discuss scan again and no abnormalities or signs of appendicitis. Will give additional pain medicine and admit patient to hospital for pain control and serial abdominal exams. [HN]    Clinical Course User Index [HN] Loetta Rough, MD    Labs: I Ordered,  and personally interpreted labs.  The pertinent results include: Those listed above  Imaging Studies ordered: CT abdomen pelvis ordered from triage I independently visualized and interpreted imaging. I agree with the radiologist interpretation  Additional history obtained from chart review, wife at bedside.    Cardiac Monitoring: The patient was maintained on a cardiac monitor.  I personally viewed and interpreted the cardiac monitored which showed an underlying rhythm of: Normal sinus rhythm  Reevaluation: After the interventions noted above, I reevaluated the patient and found that they have :stayed the same  Social Determinants of Health:  lives independently  Disposition:  Admit to Dr. Lucianne Muss for pain control, serial abdominal exams, reevaluation.   Co morbidities that complicate the patient evaluation  Past Medical History:  Diagnosis Date  BPH (benign prostatic hyperplasia)    Coronary Artery Disease    cardiologist--- dr Excell Seltzer;    Coronary CTA 09/2018: pLAD 70-90; pD1+pD2 70-90; mRCA mixed plaque - cannot rule out 70-90; FFR suggests hemodynamically significant stenosis // LHC 10/2018: 2v CAD >> s/p  CABG (L-LAD, L radial-RI, S-D2, S-RCA) with Dr. 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   LIMA--LAD;  SVG--D2;  SVG--dRCA;  left radial --- RI   Type 2 diabetes mellitus (HCC)    followed by pcp   (08-05-2022 checks blood sugar daily am fasting,  fasting sugar-- 77-100)   Wears glasses      Medicines Meds ordered this encounter  Medications   HYDROmorphone (DILAUDID) injection 0.5 mg   ondansetron (ZOFRAN) injection 4 mg   HYDROmorphone (DILAUDID) injection 0.5 mg   iohexol (OMNIPAQUE) 350 MG/ML  injection 75 mL   HYDROmorphone (DILAUDID) injection 1 mg    I have reviewed the patients home medicines and have made adjustments as needed  Problem List / ED Course: Problem List Items Addressed This Visit   None Visit Diagnoses     RLQ abdominal pain    -  Primary                   This note was created using dictation software, which may contain spelling or grammatical errors.    Loetta Rough, MD 02/13/23 [phone]

## 2023-02-13 NOTE — ED Notes (Signed)
ED TO INPATIENT HANDOFF REPORT  ED Nurse Name and Phone #: Merry Lofty [phone]  S Name/Age/Gender Danny Brewer 52 y.o. male Room/Bed: 030C/030C  Code Status   Code Status: Full Code  Home/SNF/Other Home Patient oriented to: self, place, time, and situation Is this baseline? Yes   Triage Complete: Triage complete  Chief Complaint Abdominal pain [R10.9]  Triage Note Pt arrived POV from home c/o RLQ abdominal pain that started yesterday evening and has gradually gotten worse. Pt was sent from his PCP for concern of appendicitis.    Allergies Allergies  Allergen Reactions   Antihistamines, Diphenhydramine-Type Hives and Other (See Comments)    Seizures   Bactrim [Sulfamethoxazole-Trimethoprim] Itching    Throat itching and suspected pill esophagitis   Demerol Hives   Morphine And Codeine Hives    Esp with high dose morphine, possibly Dilaudid Tolerates hydrocodone, oxycodone   Compazine [Prochlorperazine] Other (See Comments)    Spastic movement   Metoprolol     Per patient caused "severe headache"   Oxycodone     Alters Personality   Statins Other (See Comments)    Myalgias on atorvastatin 20-80mg  daily and rosuvastatin 10mg  daily    Level of Care/Admitting Diagnosis ED Disposition     ED Disposition  Admit   Condition  --   Comment  Hospital Area: [facility] [[id number]]  Level of Care: Med-Surg [16]  May place patient in observation at [facility] or [place] if equivalent level of care is available:: Yes  Covid Evaluation: Asymptomatic - no recent exposure (last 10 days) testing not required  Diagnosis: Abdominal pain [[id number]]  Admitting Physician: Gillis Santa [[id number]]  Attending Physician: Clearance Coots          B Medical/Surgery History Past Medical History:  Diagnosis Date   BPH (benign prostatic hyperplasia)    Coronary Artery Disease    cardiologist--- dr Excell Seltzer;    Coronary CTA 09/2018: pLAD 70-90; pD1+pD2  70-90; mRCA mixed plaque - cannot rule out 70-90; FFR suggests hemodynamically significant stenosis // LHC 10/2018: 2v CAD >> s/p  CABG (L-LAD, L radial-RI, S-D2, S-RCA) with Dr. 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   LIMA--LAD;  SVG--D2;  SVG--dRCA;  left radial --- RI   Type 2 diabetes mellitus (HCC)    followed by pcp   (08-05-2022 checks blood sugar daily am fasting,  fasting sugar-- 77-100)   Wears glasses    Past Surgical History:  Procedure Laterality Date   CORONARY ARTERY BYPASS GRAFT N/A 10/17/2018   Procedure: CORONARY ARTERY BYPASS GRAFTING (CABG), ON PUMP, TIMES FOUR, USING LIMA TO LAD, ENDOSCOPICALLY HARVESTED GREATER SAPHENOUS VEIN TO DIAG 2, SVG TO DISTAL RCA, AND LEFT RADIAL TO RAMUS INTERMEDIUS;  Surgeon: [name], MD;  Location: [facility] OR;  Service: Open Heart Surgery;  Laterality: N/A;   CYSTOSCOPY WITH RETROGRADE PYELOGRAM, URETEROSCOPY AND STENT PLACEMENT Right 12/30/2021   Procedure: CYSTOSCOPY WITH RETROGRADE PYELOGRAM, URETEROSCOPY AND STENT PLACEMENT, BASKET OF STONES;  Surgeon: Sebastian Ache, MD;  Location: [facility] ORS;  Service: Urology;  Laterality: Right;   CYSTOSCOPY WITH RETROGRADE PYELOGRAM, URETEROSCOPY AND  STENT PLACEMENT Left 08/07/2022   Procedure: CYSTOSCOPY WITH RETROGRADE PYELOGRAM, URETEROSCOPY AND STENT PLACEMENT;  Surgeon: Sebastian Ache, MD;  Location: [facility];  Service: Urology;  Laterality: Left;  1 HR   ENDOVEIN HARVEST OF GREATER SAPHENOUS VEIN Right 10/17/2018   Procedure: Mack Guise Of Greater Saphenous Vein;  Surgeon: [name], MD;  Location: [facility] OR;  Service: Open Heart Surgery;  Laterality: Right;   HAND SURGERY Left 1987   complex laceration   HOLMIUM LASER  APPLICATION Left 08/07/2022   Procedure: HOLMIUM LASER APPLICATION;  Surgeon: Sebastian Ache, MD;  Location: [facility];  Service: Urology;  Laterality: Left;   INGUINAL HERNIA REPAIR Bilateral 05/27/2015   Procedure: LAPAROSCOPIC BILATERAL  INGUINAL HERNIA REPAIRS WITH MESH;  Surgeon: Karie Soda, MD;  Location: [facility];  Service: General;  Laterality: Bilateral;   KNEE ARTHROSCOPY Left 1991   LEFT HEART CATH AND CORONARY ANGIOGRAPHY N/A 10/14/2018   Procedure: LEFT HEART CATH AND CORONARY ANGIOGRAPHY;  Surgeon: Lyn Records, MD;  Location: [facility];  Service: Cardiovascular;  Laterality: N/A;   PENILE PROSTHESIS IMPLANT N/A 10/03/2020   Procedure: PLACEMENT OF PENILE PROTHESIS INFLATABLE;  Surgeon: Marcine Matar, MD;  Location: [facility];  Service: Urology;  Laterality: N/A;  pubis area   RADIAL ARTERY HARVEST Left 10/17/2018   Procedure: RADIAL ARTERY HARVEST;  Surgeon: [name], MD;  Location: [facility] OR;  Service: Open Heart Surgery;  Laterality: Left;   REMOVAL OF PENILE PROSTHESIS N/A 07/07/2021   Procedure: EXPLANT  AND REPLACEMENT OF COLOPLAST TITAN PENILE PROSTHESIS;  Surgeon: Marcine Matar, MD;  Location: [facility];  Service: Urology;  Laterality: N/A;   SCROTAL EXPLORATION N/A 07/07/2021   Procedure: EXPLORATION OF PENILE PROSTHESIS;  Surgeon: Marcine Matar, MD;  Location: [facility];  Service: Urology;  Laterality: N/A;   SHOULDER ARTHROSCOPY Right 07/08/2022   @[facility]  by dr supple;   for impingment   SHOULDER CLOSED REDUCTION Left 09/04/2019   Procedure: CLOSED MANIPULATION SHOULDER;  Surgeon: Dannielle Huh, MD;  Location: [facility];  Service: Orthopedics;  Laterality: Left;   SHOULDER SURGERY Left 2022   TEE WITHOUT CARDIOVERSION N/A 10/17/2018   Procedure: TRANSESOPHAGEAL ECHOCARDIOGRAM (TEE);  Surgeon: [name], MD;  Location[facility] OR;  Service: Open Heart Surgery;  Laterality: N/A;     A IV Location/Drains/Wounds Patient  Lines/Drains/Airways Status     Active Line/Drains/Airways     Name Placement date Placement time Site Days   Peripheral IV 02/13/23 20 G Right Antecubital 02/13/23  1349  Antecubital  less than 1   Ureteral Drain/Stent Right ureter 5 Fr. 12/30/21  1822  Right ureter  410            Intake/Output Last 24 hours No intake or output data in the 24 hours ending 02/13/23 2013  Labs/Imaging Results for orders placed or performed during the hospital encounter of 02/13/23 (from the past 48 hour(s))  Comprehensive metabolic panel     Status: Abnormal   Collection Time: 02/13/23  1:45 PM  Result Value Ref Range   Sodium 138 135 - 145 mmol/L   Potassium 4.5 3.5 - 5.1 mmol/L    Comment: HEMOLYSIS AT THIS LEVEL MAY AFFECT RESULT   Chloride 102 98 - 111 mmol/L   CO2 25 22 - 32 mmol/L   Glucose, Bld 89 70 - 99 mg/dL    Comment: Glucose reference range applies only to samples taken after fasting for at least 8 hours.   BUN  10 6 - 20 mg/dL   Creatinine, Ser 1.61 (H) 0.61 - 1.24 mg/dL   Calcium 9.3 8.9 - 09.6 mg/dL   Total Protein 7.7 6.5 - 8.1 g/dL   Albumin 4.0 3.5 - 5.0 g/dL   AST 25 15 - 41 U/L    Comment: HEMOLYSIS AT THIS LEVEL MAY AFFECT RESULT   ALT 20 0 - 44 U/L    Comment: HEMOLYSIS AT THIS LEVEL MAY AFFECT RESULT   Alkaline Phosphatase 55 38 - 126 U/L   Total Bilirubin 0.7 0.3 - 1.2 mg/dL    Comment: HEMOLYSIS AT THIS LEVEL MAY AFFECT RESULT   GFR, Estimated 58 (L) >60 mL/min    Comment: (NOTE) Calculated using the CKD-EPI Creatinine Equation (2021)    Anion gap 11 5 - 15    Comment: Performed at [facility], [address], [place], [place] [address]  Lipase, blood     Status: None   Collection Time: 02/13/23  1:45 PM  Result Value Ref Range   Lipase 19 11 - 51 U/L    Comment: Performed at [facility], [address], [place], [place] [address]  CBC with Diff     Status: Abnormal   Collection Time: 02/13/23  1:45 PM  Result Value Ref Range   WBC 9.8 4.0  - 10.5 K/uL   RBC 5.83 (H) 4.22 - 5.81 MIL/uL   Hemoglobin 16.2 13.0 - 17.0 g/dL   HCT 14.7 82.9 - 56.2 %   MCV 88.9 80.0 - 100.0 fL   MCH 27.8 26.0 - 34.0 pg   MCHC 31.3 30.0 - 36.0 g/dL   RDW 13.0 86.5 - 78.4 %   Platelets 230 150 - 400 K/uL   nRBC 0.0 0.0 - 0.2 %   Neutrophils Relative % 70 %   Neutro Abs 6.9 1.7 - 7.7 K/uL   Lymphocytes Relative 16 %   Lymphs Abs 1.5 0.7 - 4.0 K/uL   Monocytes Relative 11 %   Monocytes Absolute 1.1 (H) 0.1 - 1.0 K/uL   Eosinophils Relative 2 %   Eosinophils Absolute 0.2 0.0 - 0.5 K/uL   Basophils Relative 1 %   Basophils Absolute 0.1 0.0 - 0.1 K/uL   Immature Granulocytes 0 %   Abs Immature Granulocytes 0.02 0.00 - 0.07 K/uL    Comment: Performed at [facility], [address], [place], [place] [address]  Urinalysis, Routine w reflex microscopic -Urine, Clean Catch     Status: Abnormal   Collection Time: 02/13/23  5:10 PM  Result Value Ref Range   Color, Urine YELLOW YELLOW   APPearance CLEAR CLEAR   Specific Gravity, Urine 1.036 (H) 1.005 - 1.030   pH 5.0 5.0 - 8.0   Glucose, UA NEGATIVE NEGATIVE mg/dL   Hgb urine dipstick NEGATIVE NEGATIVE   Bilirubin Urine NEGATIVE NEGATIVE   Ketones, ur 5 (A) NEGATIVE mg/dL   Protein, ur 30 (A) NEGATIVE mg/dL   Nitrite NEGATIVE NEGATIVE   Leukocytes,Ua NEGATIVE NEGATIVE   RBC / HPF 0-5 0 - 5 RBC/hpf   WBC, UA 0-5 0 - 5 WBC/hpf   Bacteria, UA RARE (A) NONE SEEN   Squamous Epithelial / HPF 0-5 0 - 5 /HPF   Mucus PRESENT     Comment: Performed at [facility], [address], [place], [place] [address]   CT ABDOMEN PELVIS W CONTRAST  Result Date: 02/13/2023 CLINICAL DATA:  Right lower quadrant pain. EXAM: CT ABDOMEN AND PELVIS  WITH CONTRAST TECHNIQUE: Multidetector CT imaging of the abdomen and pelvis was performed using the standard protocol following bolus administration of intravenous contrast. RADIATION DOSE REDUCTION: This exam was performed according to the departmental  dose-optimization program which includes automated exposure control, adjustment of the mA and/or kV according to patient size and/or use of iterative reconstruction technique. CONTRAST:  75mL OMNIPAQUE IOHEXOL 350 MG/ML SOLN COMPARISON:  08/11/2022 FINDINGS: Lower Chest: No acute findings. Hepatobiliary: No suspicious hepatic masses identified. Gallbladder is unremarkable. No evidence of biliary ductal dilatation. Pancreas:  No mass or inflammatory changes. Spleen: Within normal limits in size and appearance. Adrenals/Urinary Tract: No suspicious masses identified. Left renal calculi are again noted, largest in the upper pole measuring 8 mm. No evidence of ureteral calculi or hydronephrosis. Unremarkable unopacified urinary bladder. Stomach/Bowel: No evidence of obstruction, inflammatory process or abnormal fluid collections. Although the appendix is not directly visualized, no inflammatory process seen in region of the cecum or elsewhere. Vascular/Lymphatic: No pathologically enlarged lymph nodes. No acute vascular findings. Reproductive: No mass or other significant abnormality. Penile prosthesis reservoir noted in right lower quadrant. Other:  None. Musculoskeletal:  No suspicious bone lesions identified. IMPRESSION: No acute findings within the abdomen or pelvis. Left nephrolithiasis. No evidence of ureteral calculi or hydronephrosis. Electronically Signed   By: Danae Orleans M.D.   On: 02/13/2023 16:55    Pending Labs Unresulted Labs (From admission, onward)     Start     Ordered   02/20/23 0500  Creatinine, serum  (enoxaparin (LOVENOX)    CrCl >/= 30 ml/min)  Weekly,   R     Comments: while on enoxaparin therapy    02/13/23 1940   02/14/23 0500  CBC  Daily,   R        02/13/23 1940   02/14/23 0500  Basic metabolic panel  Daily,   R        02/13/23 1940   02/14/23 0500  Phosphorus  Daily,   R        02/13/23 1940   02/14/23 0500  Magnesium  Daily,   R        02/13/23 1940   02/13/23 1938  CBC   (enoxaparin (LOVENOX)    CrCl >/= 30 ml/min)  Once,   R       Comments: Baseline for enoxaparin therapy IF NOT ALREADY DRAWN.  Notify MD if PLT < 100 K.    02/13/23 1940   02/13/23 1938  Creatinine, serum  (enoxaparin (LOVENOX)    CrCl >/= 30 ml/min)  Once,   R       Comments: Baseline for enoxaparin therapy IF NOT ALREADY DRAWN.    02/13/23 1940            Vitals/Pain Today's Vitals   02/13/23 1912 02/13/23 1912 02/13/23 1915 02/13/23 1930  BP:   (!) 124/108 131/88  Pulse:   89 96  Resp:      Temp:  99.4 F (37.4 C)    TempSrc:  Oral    SpO2:   98% 98%  Weight:      Height:      PainSc: 10-Worst pain ever       Isolation Precautions No active isolations  Medications Medications  lidocaine (LIDODERM) 5 % 1 patch (has no administration in time range)  gabapentin (NEURONTIN) capsule 200 mg (has no administration in time range)  diclofenac Sodium (VOLTAREN) 1 % topical gel 2 g (has no administration in time range)  enoxaparin (  LOVENOX) injection 40 mg (has no administration in time range)  sodium chloride flush (NS) 0.9 % injection 3 mL (has no administration in time range)  sodium chloride flush (NS) 0.9 % injection 3 mL (has no administration in time range)  sodium chloride flush (NS) 0.9 % injection 3 mL (has no administration in time range)  0.9 %  sodium chloride infusion (has no administration in time range)  acetaminophen (TYLENOL) tablet 650 mg (has no administration in time range)    Or  acetaminophen (TYLENOL) suppository 650 mg (has no administration in time range)  HYDROmorphone (DILAUDID) injection 0.5-1 mg (has no administration in time range)  polyethylene glycol (MIRALAX / GLYCOLAX) packet 17 g (has no administration in time range)  bisacodyl (DULCOLAX) EC tablet 10 mg (has no administration in time range)  ondansetron (ZOFRAN) tablet 4 mg (has no administration in time range)    Or  ondansetron (ZOFRAN) injection 4 mg (has no administration in time  range)  aspirin EC tablet 81 mg (has no administration in time range)  0.9 %  sodium chloride infusion (has no administration in time range)  HYDROmorphone (DILAUDID) injection 0.5 mg (0.5 mg Intravenous Given 02/13/23 1353)  ondansetron (ZOFRAN) injection 4 mg (4 mg Intravenous Given 02/13/23 1353)  HYDROmorphone (DILAUDID) injection 0.5 mg (0.5 mg Intravenous Given 02/13/23 1620)  iohexol (OMNIPAQUE) 350 MG/ML injection 75 mL (75 mLs Intravenous Contrast Given 02/13/23 1601)  HYDROmorphone (DILAUDID) injection 1 mg (1 mg Intravenous Given 02/13/23 1913)    Mobility walks     Focused Assessments Pt coming in with RLQ abdominal pain starting yesterday night and gradually worsening. PCP was concerned about appendicitis. Labs and CT check out (besides left nephrolithiasis which was an incidental finding). Pain is unrelieved. Plan is to do serial labs with monitor patient. Hx of quadruple bypass.    R Recommendations: See Admitting Provider Note  Report given to:   Additional Notes: Pt A&Ox4, independent. 20G right AC for IV access.

## 2023-02-13 NOTE — H&P (Signed)
[facility] History and Physical   Patient: Danny Brewer ZOX:[id number]   PCP: Jackelyn Poling, DO DOB: 1970-09-22   DOA: 02/13/2023   DOS: 02/13/2023   DOS: the patient was seen and examined on 02/13/2023  Patient coming from: The patient is coming from Home  Chief Complaint: Abd pain RLQ  HPI: Danny Brewer is a 52 y.o. male with Past medical history of CAD s/p CABG, HTN, HLD, GERD, OSA, BPPV, NIDDM T2, s/p right ureteral stent, erectile dysfunction status post penile prosthesis, as reviewed from EMR, presented at [facility], ED with complaining of right lower quadrant abdominal pain.  Patient started having severe abdominal pain in the right lower quadrant, which is off and on and stays in the right lower quadrant and feels sharp pain, had some nausea at home but no vomiting.  Had some fever and chills and sweats at home.  Patient had right ureteral stent which has been removed, does not feel pain is due to stone.  Denies any pain in the back.  No problem with penile prosthesis, no dysuria.  ED Course: VS HR 102, RR 20, BP 125/99, saturating well on room air Abnormal labs creatinine 1.44 slightly elevated, rest of the blood work is WNL UA negative CT abdomen pelvis did not show any acute findings  Review of Systems: as mentioned in the history of present illness.  All other systems reviewed and are negative.  Past Medical History:  Diagnosis Date   BPH (benign prostatic hyperplasia)    Coronary Artery Disease    cardiologist--- dr Excell Seltzer;    Coronary CTA 09/2018: pLAD 70-90; pD1+pD2 70-90; mRCA mixed plaque - cannot rule out 70-90; FFR suggests hemodynamically significant stenosis // LHC 10/2018: 2v CAD >> s/p  CABG (L-LAD, L radial-RI, S-D2, S-RCA) with Dr. Tyrone Sage // Echo 10/23: EF 60-65, no RWMA, normal RVSF, trivial MR;   NUC 03-20-2022 normal ef 48%   ED (erectile dysfunction)    GERD    watches diet   History of kidney stones    Hypertension    Mixed hyperlipidemia    OA  (osteoarthritis)    neck , back   Renal calculus, left    S/P CABG x 4 10/17/2018   LIMA--LAD;  SVG--D2;  SVG--dRCA;  left radial --- RI   Type 2 diabetes mellitus (HCC)    followed by pcp   (08-05-2022 checks blood sugar daily am fasting,  fasting sugar-- 77-100)   Wears glasses    Past Surgical History:  Procedure Laterality Date   CORONARY ARTERY BYPASS GRAFT N/A 10/17/2018   Procedure: CORONARY ARTERY BYPASS GRAFTING (CABG), ON PUMP, TIMES FOUR, USING LIMA TO LAD, ENDOSCOPICALLY HARVESTED GREATER SAPHENOUS VEIN TO DIAG 2, SVG TO DISTAL RCA, AND LEFT RADIAL TO RAMUS INTERMEDIUS;  Surgeon: [name], MD;  Location: [facility] OR;  Service: Open Heart Surgery;  Laterality: N/A;   CYSTOSCOPY WITH RETROGRADE PYELOGRAM, URETEROSCOPY AND STENT PLACEMENT Right 12/30/2021   Procedure: CYSTOSCOPY WITH RETROGRADE PYELOGRAM, URETEROSCOPY AND STENT PLACEMENT, BASKET OF STONES;  Surgeon: Sebastian Ache, MD;  Location: [facility];  Service: Urology;  Laterality: Right;   CYSTOSCOPY WITH RETROGRADE PYELOGRAM, URETEROSCOPY AND STENT PLACEMENT Left 08/07/2022   Procedure: CYSTOSCOPY WITH RETROGRADE PYELOGRAM, URETEROSCOPY AND STENT PLACEMENT;  Surgeon: Sebastian Ache, MD;  Location: [facility];  Service: Urology;  Laterality: Left;  1 HR   ENDOVEIN HARVEST OF GREATER SAPHENOUS VEIN Right 10/17/2018   Procedure: Mack Guise Of Greater Saphenous Vein;  Surgeon: [name], MD;  Location: [facility] OR;  Service: Open Heart Surgery;  Laterality: Right;   HAND SURGERY Left 1987   complex laceration   HOLMIUM LASER APPLICATION Left 08/07/2022   Procedure: HOLMIUM LASER APPLICATION;  Surgeon: Sebastian Ache, MD;  Location: [facility];  Service: Urology;  Laterality: Left;   INGUINAL HERNIA REPAIR Bilateral 05/27/2015   Procedure: LAPAROSCOPIC BILATERAL  INGUINAL HERNIA REPAIRS WITH MESH;  Surgeon: Karie Soda, MD;  Location: [facility];  Service: General;  Laterality: Bilateral;   KNEE ARTHROSCOPY Left 1991   LEFT HEART  CATH AND CORONARY ANGIOGRAPHY N/A 10/14/2018   Procedure: LEFT HEART CATH AND CORONARY ANGIOGRAPHY;  Surgeon: Lyn Records, MD;  Location: [facility];  Service: Cardiovascular;  Laterality: N/A;   PENILE PROSTHESIS IMPLANT N/A 10/03/2020   Procedure: PLACEMENT OF PENILE PROTHESIS INFLATABLE;  Surgeon: Marcine Matar, MD;  Location: [facility];  Service: Urology;  Laterality: N/A;  pubis area   RADIAL ARTERY HARVEST Left 10/17/2018   Procedure: RADIAL ARTERY HARVEST;  Surgeon: [name], MD;  Location: [facility] OR;  Service: Open Heart Surgery;  Laterality: Left;   REMOVAL OF PENILE PROSTHESIS N/A 07/07/2021   Procedure: EXPLANT  AND REPLACEMENT OF COLOPLAST TITAN PENILE PROSTHESIS;  Surgeon: Marcine Matar, MD;  Location: [facility];  Service: Urology;  Laterality: N/A;   SCROTAL EXPLORATION N/A 07/07/2021   Procedure: EXPLORATION OF PENILE PROSTHESIS;  Surgeon: Marcine Matar, MD;  Location: [facility];  Service: Urology;  Laterality: N/A;   SHOULDER ARTHROSCOPY Right 07/08/2022   @[facility]  by dr supple;   for impingment   SHOULDER CLOSED REDUCTION Left 09/04/2019   Procedure: CLOSED MANIPULATION SHOULDER;  Surgeon: Dannielle Huh, MD;  Location: [facility];  Service: Orthopedics;  Laterality: Left;   SHOULDER SURGERY Left 2022   TEE WITHOUT CARDIOVERSION N/A 10/17/2018   Procedure: TRANSESOPHAGEAL ECHOCARDIOGRAM (TEE);  Surgeon: [name], MD;  Location: [facility] OR;  Service: Open Heart Surgery;  Laterality: N/A;   Social History:  reports that he has never smoked. He has never used smokeless tobacco. He reports that he does not drink alcohol and does not use drugs.  Allergies  Allergen Reactions   Antihistamines, Diphenhydramine-Type Hives and Other (See Comments)    Seizures   Bactrim [Sulfamethoxazole-Trimethoprim] Itching    Throat itching and suspected pill esophagitis   Demerol Hives   Morphine And Codeine Hives    Esp with high dose morphine, possibly  Dilaudid Tolerates hydrocodone, oxycodone   Compazine [Prochlorperazine] Other (See Comments)    Spastic movement   Metoprolol     Per patient caused "severe headache"   Oxycodone     Alters Personality   Statins Other (See Comments)    Myalgias on atorvastatin 20-80mg  daily and rosuvastatin 10mg  daily    Family history reviewed and not pertinent Family History  Problem Relation Age of Onset   Hypertension Mother    Cancer Father    Heart failure Father    Sudden death Brother    Heart attack Brother    Hypertension Sister    Heart murmur Sister      Prior to Admission medications   Medication Sig Start Date End Date Taking? Authorizing Provider  acetaminophen (TYLENOL) 500 MG tablet Take 2 tablets (1,000 mg total) by mouth every 6 (six) hours. Patient taking differently: Take 1,000 mg by mouth every 6 (six) hours as needed for moderate pain. 07/08/21   Marcine Matar, MD  aspirin 81 MG tablet Take 1 tablet (81 mg total)  by mouth daily. 10/11/18   Alben Spittle, Scott T, PA-C  Bempedoic Acid-Ezetimibe (NEXLIZET) 180-10 MG TABS Take 1 tablet by mouth daily. 11/17/22   Tonny Bollman, MD  diazepam (VALIUM) 2 MG tablet Take 1 tablet (2 mg total) by mouth every 12 (twelve) hours. 08/15/22   Joseph Art, DO  diclofenac Sodium (VOLTAREN) 1 % GEL Apply 2 g topically 4 (four) times daily. 08/15/22   Joseph Art, DO  empagliflozin (JARDIANCE) 25 MG TABS tablet Take 1 tablet (25 mg total) by mouth daily before breakfast. 11/16/22   Tonny Bollman, MD  Evolocumab (REPATHA SURECLICK) 140 MG/ML SOAJ Inject 140 mg into the skin every 14 (fourteen) days. 11/17/22   Tonny Bollman, MD  senna-docusate (SENOKOT-S) 8.6-50 MG tablet Take 1 tablet by mouth 2 (two) times daily. While taking strongest pain meds to prevent constipation 08/07/22   Berneice Heinrich Delbert Phenix., MD  tamsulosin [facility]) 0.4 MG CAPS capsule Take 0.4 mg by mouth daily.    [provider]    Physical Exam: Vitals:   02/13/23  1545 02/13/23 1912 02/13/23 1915 02/13/23 1930  BP: (!) 127/101  (!) 124/108 131/88  Pulse: 84  89 96  Resp:      Temp:  99.4 F (37.4 C)    TempSrc:  Oral    SpO2: 95%  98% 98%  Weight:      Height:        General: alert and oriented to time, place, and person. Appear in mild distress, affect appropriate Eyes: PERRLA, Conjunctiva normal ENT: Oral Mucosa Clear, moist  Neck: no JVD, no Abnormal Mass Or lumps Cardiovascular: S1 and S2 Present, no Murmur, peripheral pulses symmetrical Respiratory: good respiratory effort, Bilateral Air entry equal and Decreased, no signs of accessory muscle use, Clear to Auscultation, no Crackles, no wheezes Abdomen: Bowel Sound present, Soft and no tenderness, no hernia Skin: no rashes  Extremities: no Pedal edema, no calf tenderness Neurologic: without any new focal findings Gait not checked due to patient safety concerns  Data Reviewed: I have personally reviewed and interpreted labs, imaging as discussed below.  CBC: Recent Labs  Lab 02/13/23 1345  WBC 9.8  NEUTROABS 6.9  HGB 16.2  HCT 51.8  MCV 88.9  PLT 230   Basic Metabolic Panel: Recent Labs  Lab 02/13/23 1345  NA 138  K 4.5  CL 102  CO2 25  GLUCOSE 89  BUN 10  CREATININE 1.44*  CALCIUM 9.3   GFR: Estimated Creatinine Clearance: 59.3 mL/min (A) (by C-G formula based on SCr of 1.44 mg/dL (H)). Liver Function Tests: Recent Labs  Lab 02/13/23 1345  AST 25  ALT 20  ALKPHOS 55  BILITOT 0.7  PROT 7.7  ALBUMIN 4.0   Recent Labs  Lab 02/13/23 1345  LIPASE 19   No results for input(s): "AMMONIA" in the last 168 hours. Coagulation Profile: No results for input(s): "INR", "PROTIME" in the last 168 hours. Cardiac Enzymes: No results for input(s): "CKTOTAL", "CKMB", "CKMBINDEX", "TROPONINI" in the last 168 hours. BNP (last 3 results) No results for input(s): "PROBNP" in the last 8760 hours. HbA1C: No results for input(s): "HGBA1C" in the last 72 hours. CBG: No  results for input(s): "GLUCAP" in the last 168 hours. Lipid Profile: No results for input(s): "CHOL", "HDL", "LDLCALC", "TRIG", "CHOLHDL", "LDLDIRECT" in the last 72 hours. Thyroid Function Tests: No results for input(s): "TSH", "T4TOTAL", "FREET4", "T3FREE", "THYROIDAB" in the last 72 hours. Anemia Panel: No results for input(s): "VITAMINB12", "FOLATE", "FERRITIN", "TIBC", "  IRON", "RETICCTPCT" in the last 72 hours. Urine analysis:    Component Value Date/Time   COLORURINE YELLOW 02/13/2023 1710   APPEARANCEUR CLEAR 02/13/2023 1710   LABSPEC 1.036 (H) 02/13/2023 1710   PHURINE 5.0 02/13/2023 1710   GLUCOSEU NEGATIVE 02/13/2023 1710   HGBUR NEGATIVE 02/13/2023 1710   BILIRUBINUR NEGATIVE 02/13/2023 1710   KETONESUR 5 (A) 02/13/2023 1710   PROTEINUR 30 (A) 02/13/2023 1710   UROBILINOGEN 0.2 11/26/2014 1518   NITRITE NEGATIVE 02/13/2023 1710   LEUKOCYTESUR NEGATIVE 02/13/2023 1710    Radiological Exams on Admission: CT ABDOMEN PELVIS W CONTRAST  Result Date: 02/13/2023 CLINICAL DATA:  Right lower quadrant pain. EXAM: CT ABDOMEN AND PELVIS WITH CONTRAST TECHNIQUE: Multidetector CT imaging of the abdomen and pelvis was performed using the standard protocol following bolus administration of intravenous contrast. RADIATION DOSE REDUCTION: This exam was performed according to the departmental dose-optimization program which includes automated exposure control, adjustment of the mA and/or kV according to patient size and/or use of iterative reconstruction technique. CONTRAST:  75mL OMNIPAQUE IOHEXOL 350 MG/ML SOLN COMPARISON:  08/11/2022 FINDINGS: Lower Chest: No acute findings. Hepatobiliary: No suspicious hepatic masses identified. Gallbladder is unremarkable. No evidence of biliary ductal dilatation. Pancreas:  No mass or inflammatory changes. Spleen: Within normal limits in size and appearance. Adrenals/Urinary Tract: No suspicious masses identified. Left renal calculi are again noted, largest  in the upper pole measuring 8 mm. No evidence of ureteral calculi or hydronephrosis. Unremarkable unopacified urinary bladder. Stomach/Bowel: No evidence of obstruction, inflammatory process or abnormal fluid collections. Although the appendix is not directly visualized, no inflammatory process seen in region of the cecum or elsewhere. Vascular/Lymphatic: No pathologically enlarged lymph nodes. No acute vascular findings. Reproductive: No mass or other significant abnormality. Penile prosthesis reservoir noted in right lower quadrant. Other:  None. Musculoskeletal:  No suspicious bone lesions identified. IMPRESSION: No acute findings within the abdomen or pelvis. Left nephrolithiasis. No evidence of ureteral calculi or hydronephrosis. Electronically Signed   By: Danae Orleans M.D.   On: 02/13/2023 16:55     I reviewed all nursing notes, pharmacy notes, vitals, pertinent old records.  Assessment/Plan Principal Problem:   Abdominal pain   # Abdominal pain RLQ Pain is sharp and stays in the RLQ area, tenderness on superficial touch, and it is tender while holding only abdominal wall without palpating inside.  It seems localized musculoskeletal pain due to nerve inflammation for unknown reason Patient does not feel any improvement after Dilaudid given in the ED Started gabapentin 200 mg p.o. 3 times daily, apply lidocaine patch and Voltaren gel topical Continued Dilaudid IV as needed Continue serial abdominal exam CT abdomen pelvis negative for any acute findings Most likely discharge tomorrow   Elevated creatinine, could be due to dehydration Continue IV fluid for gentle hydration overnight Monitor urine output and check BMP tomorrow a.m.   CAD stable CABG, HTN, HLD Continued aspirin Resume home medications after med rec is done Monitor BP and titrate medications accordingly  BPH, resume Flomax after med rec is done  NIDDM T2, held home medications for now BG 89 wnl Continue diabetic  diet Start NovoLog sliding scale if develops hyperglycemia.   Nutrition: Carb modified diet DVT Prophylaxis: Subcutaneous Lovenox  Advance goals of care discussion: Full code   Consults: None  Family Communication: family was present at bedside, at the time of interview.  Opportunity was given to ask question and all questions were answered satisfactorily.  Disposition: Admitted as observation, med-surge unit. Likely to  be discharged home, in 1-2 days.  I have discussed plan of care as described above with RN and patient/family.  Severity of Illness: The appropriate patient status for this patient is OBSERVATION. Observation status is judged to be reasonable and necessary in order to provide the required intensity of service to ensure the patient's safety. The patient's presenting symptoms, physical exam findings, and initial radiographic and laboratory data in the context of their medical condition is felt to place them at decreased risk for further clinical deterioration. Furthermore, it is anticipated that the patient will be medically stable for discharge from the hospital within 2 midnights of admission.    Author: Gillis Santa, MD [facility] 02/13/2023 7:42 PM   To reach On-call, see care teams to locate the attending and reach out to them via [email or web address]. If 7PM-7AM, please contact night-coverage If you still have difficulty reaching the attending provider, please page the [facility] (Director on Call) for [facility] on amion for assistance.

## 2023-02-13 NOTE — ED Notes (Signed)
Messaged MD for pain medication

## 2023-02-14 DIAGNOSIS — R1031 Right lower quadrant pain: Secondary | ICD-10-CM | POA: Diagnosis not present

## 2023-02-14 LAB — BASIC METABOLIC PANEL
Anion gap: 10 (ref 5–15)
BUN: 13 mg/dL (ref 6–20)
CO2: 22 mmol/L (ref 22–32)
Calcium: 8.2 mg/dL — ABNORMAL LOW (ref 8.9–10.3)
Chloride: 103 mmol/L (ref 98–111)
Creatinine, Ser: 1.29 mg/dL — ABNORMAL HIGH (ref 0.61–1.24)
GFR, Estimated: >60 mL/min (ref >60)
Glucose, Bld: 105 mg/dL — ABNORMAL HIGH (ref 70–99)
Potassium: 3.6 mmol/L (ref 3.5–5.1)
Sodium: 135 mmol/L (ref 135–145)

## 2023-02-14 LAB — CBC
HCT: 42.8 % (ref 39.0–52.0)
Hemoglobin: 13.6 g/dL (ref 13.0–17.0)
MCH: 27.3 pg (ref 26.0–34.0)
MCHC: 31.8 g/dL (ref 30.0–36.0)
MCV: 85.9 fL (ref 80.0–100.0)
Platelets: 204 10*3/uL (ref 150–400)
RBC: 4.98 MIL/uL (ref 4.22–5.81)
RDW: 13.2 % (ref 11.5–15.5)
WBC: 9.2 10*3/uL (ref 4.0–10.5)
nRBC: 0 % (ref 0.0–0.2)

## 2023-02-14 LAB — PHOSPHORUS: Phosphorus: 3.7 mg/dL (ref 2.5–4.6)

## 2023-02-14 LAB — HEMOGLOBIN A1C
Hgb A1c MFr Bld: 6.2 % — ABNORMAL HIGH (ref 4.8–5.6)
Mean Plasma Glucose: 131.24 mg/dL

## 2023-02-14 LAB — MAGNESIUM: Magnesium: 1.9 mg/dL (ref 1.7–2.4)

## 2023-02-14 MED ORDER — POLYETHYLENE GLYCOL 3350 17 G PO PACK
17.0000 g | PACK | Freq: Every day | ORAL | Status: DC
  Administered 2023-02-14 – 2023-02-15 (×2): 17 g via ORAL
  Filled 2023-02-14 (×2): qty 1

## 2023-02-14 MED ORDER — DIAZEPAM 2 MG PO TABS
2.0000 mg | ORAL_TABLET | Freq: Two times a day (BID) | ORAL | Status: DC
  Administered 2023-02-14 – 2023-02-15 (×3): 2 mg via ORAL
  Filled 2023-02-14 (×3): qty 1

## 2023-02-14 MED ORDER — DOCUSATE SODIUM 100 MG PO CAPS
100.0000 mg | ORAL_CAPSULE | Freq: Two times a day (BID) | ORAL | Status: DC
  Administered 2023-02-14 – 2023-02-15 (×3): 100 mg via ORAL
  Filled 2023-02-14 (×3): qty 1

## 2023-02-14 MED ORDER — TAMSULOSIN HCL 0.4 MG PO CAPS
0.4000 mg | ORAL_CAPSULE | Freq: Every day | ORAL | Status: DC
  Administered 2023-02-15: 0.4 mg via ORAL
  Filled 2023-02-14 (×2): qty 1

## 2023-02-14 MED ORDER — INSULIN ASPART 100 UNIT/ML IJ SOLN: 0.0000 [IU] | Freq: Every day | INTRAMUSCULAR | Status: DC

## 2023-02-14 MED ORDER — INSULIN ASPART 100 UNIT/ML IJ SOLN: 0.0000 [IU] | Freq: Three times a day (TID) | INTRAMUSCULAR | Status: DC

## 2023-02-14 MED ORDER — CYCLOBENZAPRINE HCL 5 MG PO TABS
5.0000 mg | ORAL_TABLET | Freq: Three times a day (TID) | ORAL | Status: DC
  Administered 2023-02-14 – 2023-02-15 (×4): 5 mg via ORAL
  Filled 2023-02-14 (×4): qty 1

## 2023-02-14 MED ORDER — HYDROXYZINE HCL 10 MG PO TABS: 10.0000 mg | ORAL_TABLET | Freq: Three times a day (TID) | ORAL | Status: DC | PRN

## 2023-02-14 NOTE — Progress Notes (Signed)
Pt admitted on unit, oriented to surroundings and call light system. Wife at bedside. Plan of care discussed, all questions answered to pt's satisfaction.

## 2023-02-14 NOTE — Progress Notes (Signed)
PROGRESS NOTE    [name]  QMV:[id number] DOB: 08-08-1970 DOA: 02/13/2023 PCP: Jackelyn Poling, DO   Brief Narrative:  HPI: Danny Brewer is a 52 y.o. male with Past medical history of CAD s/p CABG, HTN, HLD, GERD, OSA, BPPV, NIDDM T2, s/p right ureteral stent, erectile dysfunction status post penile prosthesis, as reviewed from EMR, presented at [facility], ED with complaining of right lower quadrant abdominal pain.  Patient started having severe abdominal pain in the right lower quadrant, which is off and on and stays in the right lower quadrant and feels sharp pain, had some nausea at home but no vomiting.  Had some fever and chills and sweats at home.  Patient had right ureteral stent which has been removed, does not feel pain is due to stone.  Denies any pain in the back.  No problem with penile prosthesis, no dysuria.   ED Course: VS HR 102, RR 20, BP 125/99, saturating well on room air Abnormal labs creatinine 1.44 slightly elevated, rest of the blood work is WNL UA negative CT abdomen pelvis did not show any acute findings  Assessment & Plan:   Principal Problem:   Abdominal pain  Abdominal pain: Patient says that he feels miserable and has pain has not improved much.  On examination, he is tender to palpation at right middle and lower abdomen.  CT abdomen is totally negative, he is afebrile with no leukocytosis.  Based on examination, this sounds more like a musculoskeletal pain however he denies any exertional activity or lifting heavy objects.  Patient and his wife who is at the bedside, remains concerned despite of reassurance.  I have started him on muscle relaxants which has helped some but he does not feel comfortable going home yet.  Scheduled Flexeril.  Doubt infection.  CAD with history of CABG: Stable.  Asymptomatic.  Continue aspirin  BPH: Continue Flomax.  Hypertension: Controlled.  Continue to hold amlodipine for now.  Type 2 diabetes mellitus: On Jardiance at  home.  Start on SSI.  CKD stage IIIa: At baseline.  DVT prophylaxis: enoxaparin (LOVENOX) injection 40 mg Start: 02/13/23 2000   Code Status: Full Code  Family Communication: Wife present at bedside.  Plan of care discussed with patient in length and he/she verbalized understanding and agreed with it.  Status is: Observation The patient will require care spanning > 2 midnights and should be moved to inpatient because: Patient is still with abdominal pain but no organic cause.  Does not feel comfortable going home.   Estimated body mass index is 25.99 kg/m as calculated from the following:   Height as of this encounter: 5\' 9"  (1.753 m).   Weight as of this encounter: 79.8 kg.    Nutritional Assessment: Body mass index is 25.99 kg/m.Marland Kitchen Seen by dietician.  I agree with the assessment and plan as outlined below: Nutrition Status:        . Skin Assessment: I have examined the patient's skin and I agree with the wound assessment as performed by the wound care RN as outlined below:    Consultants:  None  Procedures:  None  Antimicrobials:  Anti-infectives (From admission, onward)    None         Subjective: Seen and examined.  Still complains of same abdominal pain, mostly located to the right middle and lower abdominal.  No other complaint.  Objective: Vitals:   02/13/23 2230 02/13/23 2330 02/14/23 0407 02/14/23 0808  BP: 105/87 122/88 124/76 121/80  Pulse:  95 94 92 95  Resp: 16 17 16 17   Temp:  99.2 F (37.3 C) 98 F (36.7 C) 98.2 F (36.8 C)  TempSrc:  Oral Oral Oral  SpO2: 99% 98% 98% 99%  Weight:      Height:       No intake or output data in the 24 hours ending 02/14/23 1246 Filed Weights   02/13/23 1312  Weight: 79.8 kg    Examination:  General exam: Appears calm and comfortable  Respiratory system: Clear to auscultation. Respiratory effort normal. Cardiovascular system: S1 & S2 heard, RRR. No JVD, murmurs, rubs, gallops or clicks. No pedal  edema. Gastrointestinal system: Abdomen is nondistended, soft and tender at right middle and lower abdomen. No organomegaly or masses felt. Normal bowel sounds heard. Central nervous system: Alert and oriented. No focal neurological deficits. Extremities: Symmetric 5 x 5 power. Skin: No rashes, lesions or ulcers Psychiatry: Judgement and insight appear normal. Mood & affect appropriate.    Data Reviewed: I have personally reviewed following labs and imaging studies  CBC: Recent Labs  Lab 02/13/23 1345 02/14/23 0239  WBC 9.8 9.2  NEUTROABS 6.9  --   HGB 16.2 13.6  HCT 51.8 42.8  MCV 88.9 85.9  PLT 230 204   Basic Metabolic Panel: Recent Labs  Lab 02/13/23 1345 02/14/23 0239  NA 138 135  K 4.5 3.6  CL 102 103  CO2 25 22  GLUCOSE 89 105*  BUN 10 13  CREATININE 1.44* 1.29*  CALCIUM 9.3 8.2*  MG  --  1.9  PHOS  --  3.7   GFR: Estimated Creatinine Clearance: 66.2 mL/min (A) (by C-G formula based on SCr of 1.29 mg/dL (H)). Liver Function Tests: Recent Labs  Lab 02/13/23 1345  AST 25  ALT 20  ALKPHOS 55  BILITOT 0.7  PROT 7.7  ALBUMIN 4.0   Recent Labs  Lab 02/13/23 1345  LIPASE 19   No results for input(s): "AMMONIA" in the last 168 hours. Coagulation Profile: No results for input(s): "INR", "PROTIME" in the last 168 hours. Cardiac Enzymes: No results for input(s): "CKTOTAL", "CKMB", "CKMBINDEX", "TROPONINI" in the last 168 hours. BNP (last 3 results) No results for input(s): "PROBNP" in the last 8760 hours. HbA1C: No results for input(s): "HGBA1C" in the last 72 hours. CBG: No results for input(s): "GLUCAP" in the last 168 hours. Lipid Profile: No results for input(s): "CHOL", "HDL", "LDLCALC", "TRIG", "CHOLHDL", "LDLDIRECT" in the last 72 hours. Thyroid Function Tests: No results for input(s): "TSH", "T4TOTAL", "FREET4", "T3FREE", "THYROIDAB" in the last 72 hours. Anemia Panel: No results for input(s): "VITAMINB12", "FOLATE", "FERRITIN", "TIBC",  "IRON", "RETICCTPCT" in the last 72 hours. Sepsis Labs: No results for input(s): "PROCALCITON", "LATICACIDVEN" in the last 168 hours.  No results found for this or any previous visit (from the past 240 hour(s)).   Radiology Studies: CT ABDOMEN PELVIS W CONTRAST  Result Date: 02/13/2023 CLINICAL DATA:  Right lower quadrant pain. EXAM: CT ABDOMEN AND PELVIS WITH CONTRAST TECHNIQUE: Multidetector CT imaging of the abdomen and pelvis was performed using the standard protocol following bolus administration of intravenous contrast. RADIATION DOSE REDUCTION: This exam was performed according to the departmental dose-optimization program which includes automated exposure control, adjustment of the mA and/or kV according to patient size and/or use of iterative reconstruction technique. CONTRAST:  75mL OMNIPAQUE IOHEXOL 350 MG/ML SOLN COMPARISON:  08/11/2022 FINDINGS: Lower Chest: No acute findings. Hepatobiliary: No suspicious hepatic masses identified. Gallbladder is unremarkable. No evidence of biliary ductal dilatation.  Pancreas:  No mass or inflammatory changes. Spleen: Within normal limits in size and appearance. Adrenals/Urinary Tract: No suspicious masses identified. Left renal calculi are again noted, largest in the upper pole measuring 8 mm. No evidence of ureteral calculi or hydronephrosis. Unremarkable unopacified urinary bladder. Stomach/Bowel: No evidence of obstruction, inflammatory process or abnormal fluid collections. Although the appendix is not directly visualized, no inflammatory process seen in region of the cecum or elsewhere. Vascular/Lymphatic: No pathologically enlarged lymph nodes. No acute vascular findings. Reproductive: No mass or other significant abnormality. Penile prosthesis reservoir noted in right lower quadrant. Other:  None. Musculoskeletal:  No suspicious bone lesions identified. IMPRESSION: No acute findings within the abdomen or pelvis. Left nephrolithiasis. No evidence of  ureteral calculi or hydronephrosis. Electronically Signed   By: Danae Orleans M.D.   On: 02/13/2023 16:55    Scheduled Meds:  aspirin EC  81 mg Oral Daily   cyclobenzaprine  5 mg Oral TID   diclofenac Sodium  2 g Topical QID   docusate sodium  100 mg Oral BID   enoxaparin (LOVENOX) injection  40 mg Subcutaneous Q24H   gabapentin  200 mg Oral TID   lidocaine  1 patch Transdermal Q24H   polyethylene glycol  17 g Oral Daily   sodium chloride flush  3 mL Intravenous Q12H   sodium chloride flush  3 mL Intravenous Q12H   Continuous Infusions:  sodium chloride     sodium chloride 75 mL/hr at 02/13/23 2331     LOS: 0 days   Hughie Closs, MD [facility]  02/14/2023, 12:46 PM   *Please note that this is a verbal dictation therefore any spelling or grammatical errors are due to the "[employer]agon [employer] One" system interpretation.  Please page via Amion and do not message via secure chat for urgent patient care matters. Secure chat can be used for non urgent patient care matters.  How to contact the [facility] Attending or Consulting provider 7A - 7P or covering provider during after hours 7P -7A, for this patient?  Check the care team in [facility] and look for a) attending/consulting [facility] provider listed and b) the [facility] team listed. Page or secure chat 7A-7P. Log into [email or web address] and use [place]'s universal password to access. If you do not have the password, please contact the hospital operator. Locate the [facility] provider you are looking for under [facility] and page to a number that you can be directly reached. If you still have difficulty reaching the provider, please page the [facility] (Director on Call) for the Hospitalists listed on amion for assistance.

## 2023-02-14 NOTE — Plan of Care (Signed)
  Problem: Education: Goal: Knowledge of General Education information will improve Description Including pain rating scale, medication(s)/side effects and non-pharmacologic comfort measures Outcome: Progressing   Problem: Health Behavior/Discharge Planning: Goal: Ability to manage health-related needs will improve Outcome: Progressing   

## 2023-02-15 DIAGNOSIS — R1013 Epigastric pain: Secondary | ICD-10-CM

## 2023-02-15 LAB — GLUCOSE, CAPILLARY
Glucose-Capillary: 102 mg/dL — ABNORMAL HIGH (ref 70–99)
Glucose-Capillary: 97 mg/dL (ref 70–99)
Glucose-Capillary: 119 mg/dL — ABNORMAL HIGH (ref 70–99)
Glucose-Capillary: 78 mg/dL (ref 70–99)

## 2023-02-15 MED ORDER — HYDRALAZINE HCL 20 MG/ML IJ SOLN: 10.0000 mg | INTRAMUSCULAR | Status: DC | PRN

## 2023-02-15 MED ORDER — TRAZODONE HCL 50 MG PO TABS: 50.0000 mg | ORAL_TABLET | Freq: Every evening | ORAL | Status: DC | PRN

## 2023-02-15 MED ORDER — BISACODYL 5 MG PO TBEC: 10.0000 mg | DELAYED_RELEASE_TABLET | Freq: Every day | ORAL | 0 refills | Status: DC | PRN

## 2023-02-15 MED ORDER — GUAIFENESIN 100 MG/5ML PO LIQD: 5.0000 mL | ORAL | Status: DC | PRN

## 2023-02-15 MED ORDER — GABAPENTIN 100 MG PO CAPS: 200.0000 mg | ORAL_CAPSULE | Freq: Three times a day (TID) | ORAL | 0 refills | Status: DC

## 2023-02-15 MED ORDER — METOPROLOL TARTRATE 5 MG/5ML IV SOLN: 5.0000 mg | INTRAVENOUS | Status: DC | PRN

## 2023-02-15 MED ORDER — IPRATROPIUM-ALBUTEROL 0.5-2.5 (3) MG/3ML IN SOLN: 3.0000 mL | RESPIRATORY_TRACT | Status: DC | PRN

## 2023-02-15 MED ORDER — CYCLOBENZAPRINE HCL 5 MG PO TABS: 5.0000 mg | ORAL_TABLET | Freq: Three times a day (TID) | ORAL | 0 refills | Status: DC

## 2023-02-15 NOTE — Hospital Course (Addendum)
Brief Narrative:  52 y.o. male with Past medical history of CAD s/p CABG, HTN, HLD, GERD, OSA, BPPV, NIDDM T2, s/p right ureteral stent, erectile dysfunction status post penile prosthesis, as reviewed from EMR, presented at [facility], ED with complaining of right lower quadrant abdominal pain.  Patient started having severe abdominal pain in the right lower quadrant, which is off and on and stays in the right lower quadrant and feels sharp pain, had some nausea at home but no vomiting.  Upon admission lab work including LFTs, lipase were unremarkable.  CT abdomen pelvis showed left-sided nonobstructive nephrolithiasis.  Patient was prescribed Flexeril and advised to continue Flomax with adequate hydration.  He needs to follow-up outpatient GI and urology.  Will discharge him today in stable condition.   Assessment & Plan:   Principal Problem:   Abdominal pain   Lower abdominal pain, nonspecific -Overall nonspecific.  CT abdomen pelvis unremarkable.  LFTs, lipase is negative.  Possible musculoskeletal.  Flexeril started.  Advised follow-up outpatient GI.  He is likely due for colonoscopy   CAD with history of CABG -Stable.  Asymptomatic.  Continue aspirin   BPH Nonobstructing left-sided renal stone -Continue Flomax.  Oral hydration   Hypertension -Resume home meds   Type 2 diabetes mellitus - Home meds   CKD stage IIIa -At baseline.   DVT prophylaxis: enoxaparin    Code Status: Full Code  Family Communication: Discussed with spouse over the phone

## 2023-02-15 NOTE — Progress Notes (Signed)
Danny Brewer to be D/C'd Home per MD order.  Discussed with the patient and all questions fully answered.  VSS, Skin clean, dry and intact without evidence of skin break down, no evidence of skin tears noted. IV catheter discontinued intact. Site without signs and symptoms of complications. Dressing and pressure applied.  An After Visit Summary was printed and given to the patient. Patient prescriptions sent to pharmacy.  D/c education completed with patient/family including follow up instructions, medication list, d/c activities limitations if indicated, with other d/c instructions as indicated by MD - patient able to verbalize understanding, all questions fully answered.   Patient instructed to return to ED, call 911, or call MD for any changes in condition.   Patient escorted via WC, and D/C home via private auto.  Danny Brewer 02/15/2023 11:36 AM

## 2023-02-15 NOTE — Plan of Care (Signed)
  Problem: Health Behavior/Discharge Planning: Goal: Ability to manage health-related needs will improve Outcome: Progressing   Problem: Clinical Measurements: Goal: Ability to maintain clinical measurements within normal limits will improve Outcome: Progressing   Problem: Clinical Measurements: Goal: Will remain free from infection Outcome: Progressing   Problem: Activity: Goal: Risk for activity intolerance will decrease Outcome: Progressing   Problem: Nutrition: Goal: Adequate nutrition will be maintained Outcome: Progressing   Problem: Coping: Goal: Level of anxiety will decrease Outcome: Progressing   Problem: Elimination: Goal: Will not experience complications related to bowel motility Outcome: Progressing   Problem: Pain Managment: Goal: General experience of comfort will improve Outcome: Progressing

## 2023-02-15 NOTE — Discharge Summary (Signed)
Physician Discharge Summary  Danny Brewer ZOX:[id number] DOB: [birth year] DOA: 02/13/2023  PCP: Jackelyn Poling, DO  Admit date: 02/13/2023 Discharge date: 02/15/2023  Admitted From: Home Disposition: Home  Recommendations for Outpatient Follow-up:  Follow up with PCP in 1-2 weeks Please obtain BMP/CBC in one week your next doctors visit.  Outpatient follow-up with GI and urology Flexeril prescribed   Discharge Condition: Stable CODE STATUS: Full code Diet recommendation: Heart healthy  Brief/Interim Summary: Brief Narrative:  52 y.o. male with Past medical history of CAD s/p CABG, HTN, HLD, GERD, OSA, BPPV, NIDDM T2, s/p right ureteral stent, erectile dysfunction status post penile prosthesis, as reviewed from EMR, presented at [facility], ED with complaining of right lower quadrant abdominal pain.  Patient started having severe abdominal pain in the right lower quadrant, which is off and on and stays in the right lower quadrant and feels sharp pain, had some nausea at home but no vomiting.  Upon admission lab work including LFTs, lipase were unremarkable.  CT abdomen pelvis showed left-sided nonobstructive nephrolithiasis.  Patient was prescribed Flexeril and advised to continue Flomax with adequate hydration.  He needs to follow-up outpatient GI and urology.  Will discharge him today in stable condition.   Assessment & Plan:   Principal Problem:   Abdominal pain   Lower abdominal pain, nonspecific -Overall nonspecific.  CT abdomen pelvis unremarkable.  LFTs, lipase is negative.  Possible musculoskeletal.  Flexeril started.  Advised follow-up outpatient GI.  He is likely due for colonoscopy   CAD with history of CABG -Stable.  Asymptomatic.  Continue aspirin   BPH Nonobstructing left-sided renal stone -Continue Flomax.  Oral hydration   Hypertension -Resume home meds   Type 2 diabetes mellitus - Home meds   CKD stage IIIa -At baseline.   DVT prophylaxis: enoxaparin    Code  Status: Full Code  Family Communication: Discussed with spouse over the phone   Discharge Diagnoses:  Principal Problem:   Abdominal pain      Consultations: None  Subjective: Still having slight right lower abdominal pain but mostly intermittent and slightly sharp.  No other complaints.  Discharge Exam: Vitals:   02/15/23 0507 02/15/23 0744  BP: 110/71 (!) 135/96  Pulse: 67 75  Resp: 18 17  Temp: 98.3 F (36.8 C) 97.7 F (36.5 C)  SpO2: 100% 100%   Vitals:   02/14/23 1658 02/14/23 2149 02/15/23 0507 02/15/23 0744  BP: 106/70 121/80 110/71 (!) 135/96  Pulse: 84 98 67 75  Resp: 17 18 18 17   Temp: 98.8 F (37.1 C) 98.2 F (36.8 C) 98.3 F (36.8 C) 97.7 F (36.5 C)  TempSrc: Oral Oral Oral Oral  SpO2: 100% 98% 100% 100%  Weight:      Height:        General: Pt is alert, awake, not in acute distress Cardiovascular: RRR, S1/S2 +, no rubs, no gallops Respiratory: CTA bilaterally, no wheezing, no rhonchi Abdominal: Soft, NT, ND, bowel sounds + Extremities: no edema, no cyanosis  Discharge Instructions   Allergies as of 02/15/2023       Reactions   Antihistamines, Diphenhydramine-type Hives, Other (See Comments)   Seizures   Bactrim [sulfamethoxazole-trimethoprim] Itching   Throat itching and suspected pill esophagitis   Demerol Hives   Morphine And Codeine Hives   Esp with high dose morphine, possibly Dilaudid Tolerates hydrocodone, oxycodone   Compazine [prochlorperazine] Other (See Comments)   Spastic movement   Metoprolol    Per patient caused "severe headache"  Oxycodone    Alters Personality   Statins Other (See Comments)   Myalgias on atorvastatin 20-80mg  daily and rosuvastatin 10mg  daily        Medication List     TAKE these medications    acetaminophen 500 MG tablet Commonly known as: TYLENOL Take 2 tablets (1,000 mg total) by mouth every 6 (six) hours. What changed:  when to take this reasons to take this   amLODipine 10 MG  tablet Commonly known as: NORVASC Take 10 mg by mouth at bedtime.   aspirin 81 MG tablet Take 1 tablet (81 mg total) by mouth daily.   bisacodyl 5 MG EC tablet Commonly known as: DULCOLAX Take 2 tablets (10 mg total) by mouth daily as needed for moderate constipation.   cyclobenzaprine 5 MG tablet Commonly known as: FLEXERIL Take 1 tablet (5 mg total) by mouth 3 (three) times daily.   diazepam 2 MG tablet Commonly known as: VALIUM Take 1 tablet (2 mg total) by mouth every 12 (twelve) hours.   empagliflozin 25 MG Tabs tablet Commonly known as: Jardiance Take 1 tablet (25 mg total) by mouth daily before breakfast.   gabapentin 100 MG capsule Commonly known as: NEURONTIN Take 2 capsules (200 mg total) by mouth 3 (three) times daily.   Nexlizet 180-10 MG Tabs Generic drug: Bempedoic Acid-Ezetimibe Take 1 tablet by mouth daily.   Repatha SureClick 140 MG/ML Soaj Generic drug: Evolocumab Inject 140 mg into the skin every 14 (fourteen) days.   senna-docusate 8.6-50 MG tablet Commonly known as: Senokot-S Take 1 tablet by mouth 2 (two) times daily. While taking strongest pain meds to prevent constipation   tamsulosin 0.4 MG Caps capsule Commonly known as: FLOMAX Take 0.4 mg by mouth daily.        Follow-up Information     Jackelyn Poling, DO Follow up in 1 week(s).   Specialty: Family Medicine Contact information: [address] [place] [address] [phone]                Allergies  Allergen Reactions   Antihistamines, Diphenhydramine-Type Hives and Other (See Comments)    Seizures   Bactrim [Sulfamethoxazole-Trimethoprim] Itching    Throat itching and suspected pill esophagitis   Demerol Hives   Morphine And Codeine Hives    Esp with high dose morphine, possibly Dilaudid Tolerates hydrocodone, oxycodone   Compazine [Prochlorperazine] Other (See Comments)    Spastic movement   Metoprolol     Per patient caused "severe headache"   Oxycodone      Alters Personality   Statins Other (See Comments)    Myalgias on atorvastatin 20-80mg  daily and rosuvastatin 10mg  daily    You were cared for by a hospitalist during your hospital stay. If you have any questions about your discharge medications or the care you received while you were in the hospital after you are discharged, you can call the unit and asked to speak with the hospitalist on call if the hospitalist that took care of you is not available. Once you are discharged, your primary care physician will handle any further medical issues. Please note that no refills for any discharge medications will be authorized once you are discharged, as it is imperative that you return to your primary care physician (or establish a relationship with a primary care physician if you do not have one) for your aftercare needs so that they can reassess your need for medications and monitor your lab values.  You were cared for by  a hospitalist during your hospital stay. If you have any questions about your discharge medications or the care you received while you were in the hospital after you are discharged, you can call the unit and asked to speak with the hospitalist on call if the hospitalist that took care of you is not available. Once you are discharged, your primary care physician will handle any further medical issues. Please note that NO REFILLS for any discharge medications will be authorized once you are discharged, as it is imperative that you return to your primary care physician (or establish a relationship with a primary care physician if you do not have one) for your aftercare needs so that they can reassess your need for medications and monitor your lab values.  Please request your Prim.MD to go over all Hospital Tests and Procedure/Radiological results at the follow up, please get all Hospital records sent to your Prim MD by signing hospital release before you go home.  Get CBC, CMP, 2 view Chest X  ray checked  by Primary MD during your next visit or SNF MD in 5-7 days ( we routinely change or add medications that can affect your baseline labs and fluid status, therefore we recommend that you get the mentioned basic workup next visit with your PCP, your PCP may decide not to get them or add new tests based on their clinical decision)  On your next visit with your primary care physician please Get Medicines reviewed and adjusted.  If you experience worsening of your admission symptoms, develop shortness of breath, life threatening emergency, suicidal or homicidal thoughts you must seek medical attention immediately by calling 911 or calling your MD immediately  if symptoms less severe.  You Must read complete instructions/literature along with all the possible adverse reactions/side effects for all the Medicines you take and that have been prescribed to you. Take any new Medicines after you have completely understood and accpet all the possible adverse reactions/side effects.   Do not drive, operate heavy machinery, perform activities at heights, swimming or participation in water activities or provide baby sitting services if your were admitted for syncope or siezures until you have seen by Primary MD or a Neurologist and advised to do so again.  Do not drive when taking Pain medications.   Procedures/Studies: CT ABDOMEN PELVIS W CONTRAST  Result Date: 02/13/2023 CLINICAL DATA:  Right lower quadrant pain. EXAM: CT ABDOMEN AND PELVIS WITH CONTRAST TECHNIQUE: Multidetector CT imaging of the abdomen and pelvis was performed using the standard protocol following bolus administration of intravenous contrast. RADIATION DOSE REDUCTION: This exam was performed according to the departmental dose-optimization program which includes automated exposure control, adjustment of the mA and/or kV according to patient size and/or use of iterative reconstruction technique. CONTRAST:  75mL OMNIPAQUE IOHEXOL 350  MG/ML SOLN COMPARISON:  08/11/2022 FINDINGS: Lower Chest: No acute findings. Hepatobiliary: No suspicious hepatic masses identified. Gallbladder is unremarkable. No evidence of biliary ductal dilatation. Pancreas:  No mass or inflammatory changes. Spleen: Within normal limits in size and appearance. Adrenals/Urinary Tract: No suspicious masses identified. Left renal calculi are again noted, largest in the upper pole measuring 8 mm. No evidence of ureteral calculi or hydronephrosis. Unremarkable unopacified urinary bladder. Stomach/Bowel: No evidence of obstruction, inflammatory process or abnormal fluid collections. Although the appendix is not directly visualized, no inflammatory process seen in region of the cecum or elsewhere. Vascular/Lymphatic: No pathologically enlarged lymph nodes. No acute vascular findings. Reproductive: No mass or other significant abnormality. Penile  prosthesis reservoir noted in right lower quadrant. Other:  None. Musculoskeletal:  No suspicious bone lesions identified. IMPRESSION: No acute findings within the abdomen or pelvis. Left nephrolithiasis. No evidence of ureteral calculi or hydronephrosis. Electronically Signed   By: Danae Orleans M.D.   On: 02/13/2023 16:55     The results of significant diagnostics from this hospitalization (including imaging, microbiology, ancillary and laboratory) are listed below for reference.     Microbiology: No results found for this or any previous visit (from the past 240 hour(s)).   Labs: BNP (last 3 results) Recent Labs    08/08/22 0345  BNP 33.0   Basic Metabolic Panel: Recent Labs  Lab 02/13/23 1345 02/14/23 0239  NA 138 135  K 4.5 3.6  CL 102 103  CO2 25 22  GLUCOSE 89 105*  BUN 10 13  CREATININE 1.44* 1.29*  CALCIUM 9.3 8.2*  MG  --  1.9  PHOS  --  3.7   Liver Function Tests: Recent Labs  Lab 02/13/23 1345  AST 25  ALT 20  ALKPHOS 55  BILITOT 0.7  PROT 7.7  ALBUMIN 4.0   Recent Labs  Lab  02/13/23 1345  LIPASE 19   No results for input(s): "AMMONIA" in the last 168 hours. CBC: Recent Labs  Lab 02/13/23 1345 02/14/23 0239  WBC 9.8 9.2  NEUTROABS 6.9  --   HGB 16.2 13.6  HCT 51.8 42.8  MCV 88.9 85.9  PLT 230 204   Cardiac Enzymes: No results for input(s): "CKTOTAL", "CKMB", "CKMBINDEX", "TROPONINI" in the last 168 hours. BNP: Invalid input(s): "POCBNP" CBG: Recent Labs  Lab 02/14/23 1830 02/14/23 2151 02/15/23 0746 02/15/23 0841  GLUCAP 102* 97 119* 78   D-Dimer No results for input(s): "DDIMER" in the last 72 hours. Hgb A1c Recent Labs    02/14/23 1341  HGBA1C 6.2*   Lipid Profile No results for input(s): "CHOL", "HDL", "LDLCALC", "TRIG", "CHOLHDL", "LDLDIRECT" in the last 72 hours. Thyroid function studies No results for input(s): "TSH", "T4TOTAL", "T3FREE", "THYROIDAB" in the last 72 hours.  Invalid input(s): "FREET3" Anemia work up No results for input(s): "VITAMINB12", "FOLATE", "FERRITIN", "TIBC", "IRON", "RETICCTPCT" in the last 72 hours. Urinalysis    Component Value Date/Time   COLORURINE YELLOW 02/13/2023 1710   APPEARANCEUR CLEAR 02/13/2023 1710   LABSPEC 1.036 (H) 02/13/2023 1710   PHURINE 5.0 02/13/2023 1710   GLUCOSEU NEGATIVE 02/13/2023 1710   HGBUR NEGATIVE 02/13/2023 1710   BILIRUBINUR NEGATIVE 02/13/2023 1710   KETONESUR 5 (A) 02/13/2023 1710   PROTEINUR 30 (A) 02/13/2023 1710   UROBILINOGEN 0.2 11/26/2014 1518   NITRITE NEGATIVE 02/13/2023 1710   LEUKOCYTESUR NEGATIVE 02/13/2023 1710   Sepsis Labs Recent Labs  Lab 02/13/23 1345 02/14/23 0239  WBC 9.8 9.2   Microbiology No results found for this or any previous visit (from the past 240 hour(s)).   Time coordinating discharge:  I have spent 35 minutes face to face with the patient and on the ward discussing the patients care, assessment, plan and disposition with other care givers. >50% of the time was devoted counseling the patient about the risks and benefits  of treatment/Discharge disposition and coordinating care.   SIGNED:   Miguel Rota, MD  [facility] 02/15/2023, 12:11 PM   If 7PM-7AM, please contact night-coverage

## 2023-02-25 ENCOUNTER — Encounter: Payer: Self-pay | Admitting: Cardiovascular Disease

## 2023-02-25 ENCOUNTER — Ambulatory Visit: Payer: BC Managed Care – PPO | Attending: Cardiovascular Disease | Admitting: Cardiovascular Disease

## 2023-02-25 VITALS — BP 116/96 | HR 98 | Ht 69.0 in | Wt 178.2 lb

## 2023-02-25 DIAGNOSIS — E785 Hyperlipidemia, unspecified: Secondary | ICD-10-CM

## 2023-02-25 DIAGNOSIS — I2510 Atherosclerotic heart disease of native coronary artery without angina pectoris: Secondary | ICD-10-CM | POA: Diagnosis not present

## 2023-02-25 DIAGNOSIS — E119 Type 2 diabetes mellitus without complications: Secondary | ICD-10-CM

## 2023-02-25 DIAGNOSIS — I10 Essential (primary) hypertension: Secondary | ICD-10-CM

## 2023-02-25 NOTE — Patient Instructions (Signed)
Medication Instructions:  Your physician recommends that you continue on your current medications as directed. Please refer to the Current Medication list given to you today.  *If you need a refill on your cardiac medications before your next appointment, please call your pharmacy*   Lab Work: None ordered   If you have labs (blood work) drawn today and your tests are completely normal, you will receive your results only by: MyChart Message (if you have MyChart) OR A paper copy in the mail If you have any lab test that is abnormal or we need to change your treatment, we will call you to review the results.   Testing/Procedures: None ordered    Follow-Up: At [facility], you and your health needs are our priority.  As part of our continuing mission to provide you with exceptional heart care, we have created designated Provider Care Teams.  These Care Teams include your primary Cardiologist (physician) and Advanced Practice Providers (APPs -  Physician Assistants and Nurse Practitioners) who all work together to provide you with the care you need, when you need it.  We recommend signing up for the patient portal called "MyChart".  Sign up information is provided on this After Visit Summary.  MyChart is used to connect with patients for Virtual Visits (Telemedicine).  Patients are able to view lab/test results, encounter notes, upcoming appointments, etc.  Non-urgent messages can be sent to your provider as well.   To learn more about what you can do with MyChart, go to [email or web address].    Your next appointment:   6 month(s)  Provider:   Tereso Newcomer PA-C  Other Instructions

## 2023-02-25 NOTE — Progress Notes (Signed)
Cardiology Office Note:    Date:  02/25/2023   ID:  Danny Brewer, DOB 05/23/1971, MRN [id number]  PCP:  Jackelyn Poling, DO   [facility] Providers Cardiologist:  Tonny Bollman, MD Cardiology APP:  Kennon Rounds     Referring MD: Jackelyn Poling, DO   Chief Complaint  Patient presents with   Coronary Artery Disease    History of Present Illness:    Danny Brewer is a 52 y.o. male with a hx of:  Coronary artery disease  s/p CABG 10/2018 Echocardiogram 09/19/18: EF 60-65 Intol of beta-blocker  Myoview 03/20/2022: EF 48, normal perfusion, low risk Echo 03/25/2022 EF 60-65, no RWMA, normal RVSF, trivial MR Diabetes mellitus 2 Hypertension  Hyperlipidemia  Intol of statins >> Rx w Alirocumab (Praluent) Carotid stenosis  [place] 10/21/20: Bilateral ICA 1-39 [place] 03/21/22: no ICA stenosis Bilat ED  Nephrolithiasis (s/p stone extraction in July 2023)   The patient is here alone today.  He has been doing well from a cardiovascular perspective with no exertional chest pain or pressure, dyspnea, or heart palpitations.  No lightheadedness or syncope.  Reports well-controlled blood pressure.  He continues to have chronic sternal pain following surgery.  He has followed up with 2 different cardiac surgeons and has elected for conservative management.  He can no longer work in his physical job because he is unable to do lifting with his lower sternal pain.  He is now on disability and brings in paperwork to be filled out.  Past Medical History:  Diagnosis Date   BPH (benign prostatic hyperplasia)    Coronary Artery Disease    cardiologist--- dr Excell Seltzer;    Coronary CTA 09/2018: pLAD 70-90; pD1+pD2 70-90; mRCA mixed plaque - cannot rule out 70-90; FFR suggests hemodynamically significant stenosis // LHC 10/2018: 2v CAD >> s/p  CABG (L-LAD, L radial-RI, S-D2, S-RCA) with Dr. 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   LIMA--LAD;  SVG--D2;  SVG--dRCA;  left radial --- RI   Type 2 diabetes mellitus (HCC)    followed by pcp   (08-05-2022 checks blood sugar daily am fasting,  fasting sugar-- 77-100)   Wears glasses     Past Surgical History:  Procedure Laterality Date   CORONARY ARTERY BYPASS GRAFT N/A 10/17/2018   Procedure: CORONARY ARTERY BYPASS GRAFTING (CABG), ON PUMP, TIMES FOUR, USING LIMA TO LAD, ENDOSCOPICALLY HARVESTED GREATER SAPHENOUS VEIN TO DIAG 2, SVG TO DISTAL RCA, AND LEFT RADIAL TO RAMUS INTERMEDIUS;  Surgeon: [name], MD;  Location: [facility] OR;  Service: Open Heart Surgery;  Laterality: N/A;   CYSTOSCOPY WITH RETROGRADE PYELOGRAM, URETEROSCOPY AND STENT PLACEMENT Right 12/30/2021   Procedure: CYSTOSCOPY WITH RETROGRADE PYELOGRAM, URETEROSCOPY AND STENT PLACEMENT, BASKET OF STONES;  Surgeon: Sebastian Ache, MD;  Location: [facility];  Service: Urology;  Laterality: Right;   CYSTOSCOPY WITH RETROGRADE PYELOGRAM, URETEROSCOPY AND STENT PLACEMENT Left 08/07/2022   Procedure: CYSTOSCOPY WITH RETROGRADE PYELOGRAM, URETEROSCOPY AND STENT PLACEMENT;  Surgeon: Sebastian Ache, MD;  Location: [facility];  Service: Urology;  Laterality: Left;  1 HR   ENDOVEIN HARVEST OF GREATER SAPHENOUS VEIN Right  10/17/2018   Procedure: Mack Guise Of Greater Saphenous Vein;  Surgeon: [name], MD;  Location: [facility] OR;  Service: Open Heart Surgery;  Laterality: Right;   HAND SURGERY Left 1987   complex laceration   HOLMIUM LASER APPLICATION Left 08/07/2022   Procedure: HOLMIUM LASER APPLICATION;  Surgeon: Sebastian Ache, MD;  Location: [facility];  Service: Urology;  Laterality: Left;   INGUINAL HERNIA REPAIR Bilateral 05/27/2015   Procedure: LAPAROSCOPIC BILATERAL  INGUINAL HERNIA REPAIRS WITH MESH;   Surgeon: Karie Soda, MD;  Location: [facility];  Service: General;  Laterality: Bilateral;   KNEE ARTHROSCOPY Left 1991   LEFT HEART CATH AND CORONARY ANGIOGRAPHY N/A 10/14/2018   Procedure: LEFT HEART CATH AND CORONARY ANGIOGRAPHY;  Surgeon: Lyn Records, MD;  Location: [facility];  Service: Cardiovascular;  Laterality: N/A;   PENILE PROSTHESIS IMPLANT N/A 10/03/2020   Procedure: PLACEMENT OF PENILE PROTHESIS INFLATABLE;  Surgeon: Marcine Matar, MD;  Location: [facility];  Service: Urology;  Laterality: N/A;  pubis area   RADIAL ARTERY HARVEST Left 10/17/2018   Procedure: RADIAL ARTERY HARVEST;  Surgeon: [name], MD;  Location: [facility] OR;  Service: Open Heart Surgery;  Laterality: Left;   REMOVAL OF PENILE PROSTHESIS N/A 07/07/2021   Procedure: EXPLANT  AND REPLACEMENT OF COLOPLAST TITAN PENILE PROSTHESIS;  Surgeon: Marcine Matar, MD;  Location: [facility];  Service: Urology;  Laterality: N/A;   SCROTAL EXPLORATION N/A 07/07/2021   Procedure: EXPLORATION OF PENILE PROSTHESIS;  Surgeon: Marcine Matar, MD;  Location: [facility];  Service: Urology;  Laterality: N/A;   SHOULDER ARTHROSCOPY Right 07/08/2022   @[facility]  by dr supple;   for impingment   SHOULDER CLOSED REDUCTION Left 09/04/2019   Procedure: CLOSED MANIPULATION SHOULDER;  Surgeon: Dannielle Huh, MD;  Location: [facility];  Service: Orthopedics;  Laterality: Left;   SHOULDER SURGERY Left 2022   TEE WITHOUT CARDIOVERSION N/A 10/17/2018   Procedure: TRANSESOPHAGEAL ECHOCARDIOGRAM (TEE);  Surgeon: [name], MD;  Location: [facility] OR;  Service: Open Heart Surgery;  Laterality: N/A;    Current Medications: Current Meds  Medication Sig   acetaminophen (TYLENOL) 500 MG tablet Take 2 tablets (1,000 mg total) by mouth every 6 (six) hours. (Patient taking differently: Take 1,000 mg by mouth every 6 (six) hours as needed for moderate pain.)   amLODipine (NORVASC) 10 MG tablet Take 10 mg by mouth at bedtime.    aspirin 81 MG tablet Take 1 tablet (81 mg total) by mouth daily.   Bempedoic Acid-Ezetimibe (NEXLIZET) 180-10 MG TABS Take 1 tablet by mouth daily.   empagliflozin (JARDIANCE) 25 MG TABS tablet Take 1 tablet (25 mg total) by mouth daily before breakfast.   Evolocumab (REPATHA SURECLICK) 140 MG/ML SOAJ Inject 140 mg into the skin every 14 (fourteen) days.   tamsulosin (FLOMAX) 0.4 MG CAPS capsule Take 0.4 mg by mouth daily.   Current Facility-Administered Medications for the 02/25/23 encounter (Office Visit) with Tonny Bollman, MD  Medication   incobotulinumtoxinA (XEOMIN) 100 units injection 70 Units     Allergies:   Antihistamines, diphenhydramine-type; Bactrim [sulfamethoxazole-trimethoprim]; Demerol; Morphine and codeine; Compazine [prochlorperazine]; Metoprolol; Oxycodone; and Statins   Social History   Socioeconomic History   Marital status: Married    Spouse name: Not on file   Number of children: Not on file   Years of education: 14   Highest education level: Not on file  Occupational History   Occupation: [employer]  Tobacco Use   Smoking  status: Never   Smokeless tobacco: Never  Vaping Use   Vaping status: Never Used  Substance and Sexual Activity   Alcohol use: No   Drug use: Never   Sexual activity: Not on file  Other Topics Concern   Not on file  Social History Narrative   Right handed   Caffeine use: 1 tea/day   Social Determinants of Health   Financial Resource Strain: Not on file  Food Insecurity: No Food Insecurity (02/14/2023)   Hunger Vital Sign    Worried About Running Out of Food in the Last Year: Never true    Ran Out of Food in the Last Year: Never true  Transportation Needs: No Transportation Needs (02/14/2023)   PRAPARE - Administrator, Civil Service (Medical): No    Lack of Transportation (Non-Medical): No  Physical Activity: Not on file  Stress: Not on file  Social Connections: Not on file     Family History: The patient's  family history includes Cancer in his father; Heart attack in his brother; Heart failure in his father; Heart murmur in his sister; Hypertension in his mother and sister; Sudden death in his brother.  ROS:   Please see the history of present illness.    All other systems reviewed and are negative.  EKGs/Labs/Other Studies Reviewed:    The following studies were reviewed today: 2D echocardiogram 05/14/2022:  1. Left ventricular ejection fraction, by estimation, is 60 to 65%. The  left ventricle has normal function. The left ventricle has no regional  wall motion abnormalities. Left ventricular diastolic parameters were  normal.   2. Right ventricular systolic function is normal. The right ventricular  size is normal.   3. The mitral valve is normal in structure. Trivial mitral valve  regurgitation.   4. The aortic valve is tricuspid. Aortic valve regurgitation is not  visualized.       Recent Labs: 08/08/2022: B Natriuretic Peptide 33.0 02/13/2023: ALT 20 02/14/2023: BUN 13; Creatinine, Ser 1.29; Hemoglobin 13.6; Magnesium 1.9; Platelets 204; Potassium 3.6; Sodium 135  Recent Lipid Panel    Component Value Date/Time   CHOL 122 12/29/2022 1022   TRIG 58 12/29/2022 1022   HDL 62 12/29/2022 1022   CHOLHDL 2.0 12/29/2022 1022   CHOLHDL 4.3 09/19/2018 0243   VLDL 13 09/19/2018 0243   LDLCALC 47 12/29/2022 1022     Risk Assessment/Calculations:           Physical Exam:    VS:  BP (!) 116/96   Pulse 98   Ht 5\' 9"  (1.753 m)   Wt 178 lb 3.2 oz (80.8 kg)   SpO2 99%   BMI 26.32 kg/m     Wt Readings from Last 3 Encounters:  02/25/23 178 lb 3.2 oz (80.8 kg)  02/13/23 176 lb (79.8 kg)  12/21/22 185 lb (83.9 kg)     GEN:  Well nourished, well developed in no acute distress HEENT: Normal NECK: No JVD; No carotid bruits LYMPHATICS: No lymphadenopathy CARDIAC: RRR, no murmurs, rubs, gallops RESPIRATORY:  Clear to auscultation without rales, wheezing or rhonchi  ABDOMEN: Soft,  non-tender, non-distended MUSCULOSKELETAL:  No edema; No deformity  SKIN: Warm and dry NEUROLOGIC:  Alert and oriented x 3 PSYCHIATRIC:  Normal affect   ASSESSMENT:    1. Coronary artery disease involving native coronary artery of native heart without angina pectoris   2. Type 2 diabetes mellitus without complication, without long-term current use of insulin (HCC)   3. Hyperlipidemia LDL  goal <70   4. Essential hypertension    PLAN:    In order of problems listed above:  Stable with no anginal symptoms.  He will continue aspirin for antiplatelet therapy, and with his statin intolerance he is treated with a PCSK9 inhibitor. Treated by his primary physician.  He has done a great job with diet and has lost weight.  Patient on Jardiance. Treated with Repatha.  Last LDL cholesterol drawn recently is 47. Blood pressure well-controlled on current medical therapy.  Creatinine is 1.29 and potassium is 3.6. Patient with chronic sternal pain and functional limitation unable to do any heavy lifting.  He is now on disability.     Medication Adjustments/Labs and Tests Ordered: Current medicines are reviewed at length with the patient today.  Concerns regarding medicines are outlined above.  No orders of the defined types were placed in this encounter.  No orders of the defined types were placed in this encounter.   Patient Instructions  Medication Instructions:  Your physician recommends that you continue on your current medications as directed. Please refer to the Current Medication list given to you today.  *If you need a refill on your cardiac medications before your next appointment, please call your pharmacy*   Lab Work: None ordered   If you have labs (blood work) drawn today and your tests are completely normal, you will receive your results only by: MyChart Message (if you have MyChart) OR A paper copy in the mail If you have any lab test that is abnormal or we need to change  your treatment, we will call you to review the results.   Testing/Procedures: None ordered    Follow-Up: At [facility], you and your health needs are our priority.  As part of our continuing mission to provide you with exceptional heart care, we have created designated Provider Care Teams.  These Care Teams include your primary Cardiologist (physician) and Advanced Practice Providers (APPs -  Physician Assistants and Nurse Practitioners) who all work together to provide you with the care you need, when you need it.  We recommend signing up for the patient portal called "MyChart".  Sign up information is provided on this After Visit Summary.  MyChart is used to connect with patients for Virtual Visits (Telemedicine).  Patients are able to view lab/test results, encounter notes, upcoming appointments, etc.  Non-urgent messages can be sent to your provider as well.   To learn more about what you can do with MyChart, go to [email or web address].    Your next appointment:   6 month(s)  Provider:   Tereso Newcomer PA-C  Other Instructions     Signed, Tonny Bollman, MD  02/25/2023 1:08 PM    [facility]

## 2023-03-15 ENCOUNTER — Ambulatory Visit: Payer: BC Managed Care – PPO | Admitting: Neurology

## 2023-03-15 ENCOUNTER — Encounter: Payer: Self-pay | Admitting: Neurology

## 2023-04-23 ENCOUNTER — Ambulatory Visit: Payer: BC Managed Care – PPO | Admitting: Cardiovascular Disease

## 2023-04-26 ENCOUNTER — Ambulatory Visit: Payer: BC Managed Care – PPO | Attending: Cardiovascular Disease | Admitting: Cardiovascular Disease

## 2023-04-26 ENCOUNTER — Encounter: Payer: Self-pay | Admitting: Cardiovascular Disease

## 2023-04-26 VITALS — BP 118/60 | HR 94 | Ht 69.0 in | Wt 181.0 lb

## 2023-04-26 DIAGNOSIS — I479 Paroxysmal tachycardia, unspecified: Secondary | ICD-10-CM

## 2023-04-26 NOTE — Patient Instructions (Addendum)
Follow-Up: At [facility], you and your health needs are our priority.  As part of our continuing mission to provide you with exceptional heart care, we have created designated Provider Care Teams.  These Care Teams include your primary Cardiologist (physician) and Advanced Practice Providers (APPs -  Physician Assistants and Nurse Practitioners) who all work together to provide you with the care you need, when you need it.  Your next appointment:   As scheduled/planned  Provider:   Tonny Bollman, MD       Other Instructions [facility] device

## 2023-04-26 NOTE — Assessment & Plan Note (Signed)
Patient had abrupt onset of tachycardia with associated symptoms of heart racing, palpitation, and arm discomfort.  This was a one-time event and he did not recall any inciting factors.  He has had no recurrence over the past week.  We discussed potential options including medical therapy with a beta-blocker, testing with a ZIO monitor.  After shared decision making conversation, we agree that we will just keep an eye on this.  He will continue with his routine scheduled follow-up and will reach out if he has any problems.  I told him about the option of a Kardia or Alivecor monitor as well. He has been intolerant to beta blockers so I did not prescribe any scheduled dose or prn beta blocker today. His EKG is reviewed and shows no changes from baseline.

## 2023-04-26 NOTE — Progress Notes (Signed)
Cardiology Office Note:    Date:  04/26/2023   ID:  Danny Brewer, DOB 1970-12-02, MRN [id number]  PCP:  Danny Poling, DO   [facility] Providers Cardiologist:  Danny Bollman, MD Cardiology APP:  Danny Brewer     Referring MD: Danny Poling, DO   Chief Complaint  Patient presents with   Palpitations    History of Present Illness:    Danny Brewer is a 52 y.o. male with a hx of:  Coronary artery disease  s/p CABG 10/2018 Echocardiogram 09/19/18: EF 60-65 Intol of beta-blocker  Myoview 03/20/2022: EF 48, normal perfusion, low risk Echo 03/25/2022 EF 60-65, no RWMA, normal RVSF, trivial MR Diabetes mellitus 2 Hypertension  Hyperlipidemia  Intol of statins >> Rx w Alirocumab (Praluent) Carotid stenosis  [place] 10/21/20: Bilateral ICA 1-39 [place] 03/21/22: no ICA stenosis Bilat ED  Nephrolithiasis (s/p stone extraction in July 2023)  The patient is here alone today.  I saw him last in September for scheduled follow-up and he was doing reasonably well at that time with no acute symptoms.  Last week he had an episode of "fast heartbeat" and was added onto my schedule today for further evaluation.  He felt sudden onset of his heart racing and he experienced heart palpitations with this.  He also had some discomfort in his arm.  Symptoms were self-limited and lasted about 20 minutes but he states that he felt tired for the remainder of the day.  He has had no problems since then.  He has stayed on his medicines with no changes.  He denies any chest pain or pressure, shortness of breath, lightheadedness, or syncope.  Current Medications: Current Meds  Medication Sig   acetaminophen (TYLENOL) 500 MG tablet Take 2 tablets (1,000 mg total) by mouth every 6 (six) hours. (Patient taking differently: Take 1,000 mg by mouth every 6 (six) hours as needed for moderate pain (pain score 4-6).)   amLODipine (NORVASC) 10 MG tablet Take 10 mg by mouth at bedtime.   aspirin 81 MG  tablet Take 1 tablet (81 mg total) by mouth daily.   Bempedoic Acid-Ezetimibe (NEXLIZET) 180-10 MG TABS Take 1 tablet by mouth daily.   empagliflozin (JARDIANCE) 25 MG TABS tablet Take 1 tablet (25 mg total) by mouth daily before breakfast.   Evolocumab (REPATHA SURECLICK) 140 MG/ML SOAJ Inject 140 mg into the skin every 14 (fourteen) days.   tamsulosin (FLOMAX) 0.4 MG CAPS capsule Take 0.4 mg by mouth daily.   Current Facility-Administered Medications for the 04/26/23 encounter (Office Visit) with Danny Bollman, MD  Medication   incobotulinumtoxinA [facility]) 100 units injection 70 Units     Allergies:   Antihistamines, diphenhydramine-type; Bactrim [sulfamethoxazole-trimethoprim]; Demerol; Morphine and codeine; Compazine [prochlorperazine]; Metoprolol; Oxycodone; and Statins   ROS:   Please see the history of present illness.    All other systems reviewed and are negative.  EKGs/Labs/Other Studies Reviewed:    The following studies were reviewed today: Cardiac Studies & Procedures   CARDIAC CATHETERIZATION  CARDIAC CATHETERIZATION 10/14/2018  Narrative  Diabetic with family history of premature coronary atherosclerosis.  Atypical and typical symptoms.  Recurring episodes of chest tightness at rest which is mimicked by left and right coronary contrast injections.  Symptoms in arm and neck are more continuous, positional, and not likely ischemic.  Severe two-vessel coronary disease with segmental 90 to 95% proximal to mid LAD, segmental 80% large first diagonal that supplies the distribution of the ramus intermedius or circumflex, segmental 80%  stenosis in the moderate-sized second diagonal that is contained within the LAD stenosis as a bifurcation lesion, and segmental mid to distal.  The patient is right dominant.  Circumflex is small.  Normal LVEDP  RECOMMENDATIONS:   In room consultation with [facility] CV surgeon Dr. Sheliah Brewer.  After discussion with patient, his wife, and  surgeon, I have decided to admit the patient and he will undergo arterial grafting to the LAD, diagonal, and probable saphenous vein grafting to the right coronary on Monday.  Decided against PCI because of the requirement for long stents in all segments and anticipated decreased durability over time given his young age and diabetes.  IV nitroglycerin, therapeutic Lovenox.  Screening COVID testing.  Sleep study as OP. Clinical history is strongly suggestive.  Findings Coronary Findings Diagnostic  Dominance: Right  Left Anterior Descending Prox LAD to Mid LAD lesion is 95% stenosed.  First Diagonal Branch Ost 1st Diag to 1st Diag lesion is 80% stenosed.  Second Diagonal [employer] 2nd Diag to 2nd Diag lesion is 80% stenosed.  Left Circumflex Vessel is small.  First Obtuse Marginal Branch Vessel is small in size.  Second Obtuse Marginal Branch Vessel is small in size.  Right Coronary Artery Mid RCA to Dist RCA lesion is 85% stenosed.  Intervention  No interventions have been documented.   STRESS TESTS  MYOCARDIAL PERFUSION IMAGING 03/20/2022  Narrative   The study is normal. The study is low risk.   No ST deviation was noted.   LV perfusion is normal. There is no evidence of ischemia. There is no evidence of infarction.   Left ventricular function is abnormal. Global function is mildly reduced. Nuclear stress EF: 48 %. The left ventricular ejection fraction is mildly decreased (45-54%). End diastolic cavity size is normal. End systolic cavity size is normal.   Prior study not available for comparison.  Normal perfusion Mild systolic dysfunction (EF 48%) with septal hypokinesis.  Consider echocardiogram for further evaluation Low risk study   ECHOCARDIOGRAM  ECHOCARDIOGRAM COMPLETE 03/25/2022  Narrative ECHOCARDIOGRAM REPORT    Patient Name:   Danny Brewer Date of Exam: 03/25/2022 Medical Rec #:  [id number]     Height:       69.0 in Accession #:     [id number]    Weight:       194.0 lb Date of Birth:  27-Apr-1971     BSA:          2.039 m Patient Age:    51 years      BP:           134/85 mmHg Patient Gender: M             HR:           92 bpm. Exam Location:  [address]  Procedure: 2D Echo, Cardiac Doppler and Color Doppler  Indications:    I50.9* Heart failure (unspecified)  History:        Patient has prior history of Echocardiogram examinations, most recent 10/17/2018. CAD, Prior CABG, Signs/Symptoms:Chest Pain; Risk Factors:Hypertension, Diabetes, Dyslipidemia and Sleep Apnea. Carotid artery disease. Precordial chet pain. Dizziness.  Sonographer:    Cathie Beams RCS Referring Phys: [id number] Ever[address] Bio WEAVER  IMPRESSIONS   1. Left ventricular ejection fraction, by estimation, is 60 to 65%. The left ventricle has normal function. The left ventricle has no regional wall motion abnormalities. Left ventricular diastolic parameters were normal. 2. Right ventricular systolic function is normal. The right ventricular size is normal.  3. The mitral valve is normal in structure. Trivial mitral valve regurgitation. 4. The aortic valve is tricuspid. Aortic valve regurgitation is not visualized.  FINDINGS Left Ventricle: Left ventricular ejection fraction, by estimation, is 60 to 65%. The left ventricle has normal function. The left ventricle has no regional wall motion abnormalities. The left ventricular internal cavity size was normal in size. There is no left ventricular hypertrophy. Left ventricular diastolic parameters were normal.  Right Ventricle: The right ventricular size is normal. Right vetricular wall thickness was not assessed. Right ventricular systolic function is normal.  Left Atrium: Left atrial size was normal in size.  Right Atrium: Right atrial size was normal in size.  Pericardium: There is no evidence of pericardial effusion.  Mitral Valve: The mitral valve is normal in structure. Trivial mitral valve  regurgitation.  Tricuspid Valve: The tricuspid valve is normal in structure. Tricuspid valve regurgitation is trivial.  Aortic Valve: The aortic valve is tricuspid. Aortic valve regurgitation is not visualized.  Pulmonic Valve: The pulmonic valve was normal in structure. Pulmonic valve regurgitation is mild.  Aorta: The aortic root and ascending aorta are structurally normal, with no evidence of dilitation.  IAS/Shunts: No atrial level shunt detected by color flow Doppler.   LEFT VENTRICLE PLAX 2D LVIDd:         3.60 cm   Diastology LVIDs:         2.40 cm   LV e' medial:    6.96 cm/s LV PW:         1.00 cm   LV E/e' medial:  8.1 LV IVS:        1.00 cm   LV e' lateral:   9.79 cm/s LVOT diam:     2.20 cm   LV E/e' lateral: 5.8 LV SV:         44 LV SV Index:   22 LVOT Area:     3.80 cm   RIGHT VENTRICLE RV Basal diam:  3.30 cm RV S prime:     8.81 cm/s TAPSE (M-mode): 1.5 cm  LEFT ATRIUM             Index        RIGHT ATRIUM           Index LA diam:        3.10 cm 1.52 cm/m   RA Area:     12.60 cm LA Vol (A2C):   26.3 ml 12.90 ml/m  RA Volume:   30.40 ml  14.91 ml/m LA Vol (A4C):   13.0 ml 6.37 ml/m LA Biplane Vol: 19.6 ml 9.61 ml/m AORTIC VALVE LVOT Vmax:   75.80 cm/s LVOT Vmean:  47.600 cm/s LVOT VTI:    0.117 m  AORTA Ao Root diam: 3.60 cm Ao Asc diam:  3.40 cm  MITRAL VALVE MV Area (PHT): 4.06 cm    SHUNTS MV Decel Time: 187 msec    Systemic VTI:  0.12 m MV E velocity: 56.70 cm/s  Systemic Diam: 2.20 cm MV A velocity: 56.20 cm/s MV E/A ratio:  1.01  Dietrich Pates MD Electronically signed by Dietrich Pates MD Signature Date/Time: 03/25/2022/5:00:59 PM    Final   TEE  ECHO INTRAOPERATIVE TEE 10/17/2018  Narrative *INTRAOPERATIVE TRANSESOPHAGEAL REPORT *    Patient Name:   RAYDEL LATSKO Date of Exam: 10/17/2018 Medical Rec #:  [id number]     Height:       69.0 in Accession #:    [id number]    Weight:  198.4 lb Date of Birth:  [birth year]     BSA:           2.06 m Patient Age:    47 years      BP:           147/96 mmHg Patient Gender: M             HR:           106 bpm. Exam Location:  Inpatient  Transesophogeal exam was perform intraoperatively during surgical procedure. Patient was closely monitored under general anesthesia during the entirety of examination.  Indications:     Chest Pain Performing Phys: [id number]wen[address] Daily [id number] Diagnosing Phys: Gaynelle Adu MD  Complications: No known complications during this procedure. POST-OP IMPRESSIONS Overall, there were no significant changes from pre-bypass.  PRE-OP FINDINGS Left Ventricle: The left ventricle has normal systolic function, with an ejection fraction of 55-60%. The cavity size was normal. There is no increase in left ventricular wall thickness.  Right Ventricle: The right ventricle has normal systolic function. The cavity was mildly enlarged. There is no increase in right ventricular wall thickness.  Left Atrium: Left atrial size was not assessed.  Right Atrium: Right atrial size was not assessed.  Interatrial Septum: No atrial level shunt detected by color flow Doppler.  Pericardium: There is no evidence of pericardial effusion.  Mitral Valve: The mitral valve is normal in structure. Mitral valve regurgitation is trivial by color flow Doppler.  Tricuspid Valve: The tricuspid valve was normal in structure. Tricuspid valve regurgitation was not visualized by color flow Doppler.  Aortic Valve: The aortic valve is normal in structure. Aortic valve regurgitation was not visualized by color flow Doppler.  Pulmonic Valve: The pulmonic valve was normal in structure. Pulmonic valve regurgitation is not visualized by color flow Doppler.    Gaynelle Adu MD Electronically signed by Gaynelle Adu MD Signature Date/Time: 10/18/2018/11:38:13 AM    Final    CT SCANS  CT CORONARY MORPH W/CTA COR W/SCORE 10/06/2018  Addendum 10/06/2018  4:22 PM ADDENDUM  REPORT: 10/06/2018 16:20  CLINICAL DATA:  Chest pain  EXAM: Cardiac CTA  MEDICATIONS: Sub lingual nitro. 4mg  x 2 and lopressor mg  TECHNIQUE: The patient was scanned on a Siemens 192 slice scanner. Gantry rotation speed was 250 msecs. Collimation was 0.6 mm. A 100 kV prospective scan was triggered in the ascending thoracic aorta at 35-75% of the R-R interval. Average HR during the scan was bpm. The 3D data set was interpreted on a dedicated work station using MPR, MIP and VRT modes. A total of 80cc of contrast was used.  FINDINGS: Non-cardiac: See separate report from [facility].  Pulmonary veins drain normally to the left atrium.  Calcium Score: Coronary artery calcium score 448 Agatston units.  Coronary Arteries: Right dominant with no anomalies  LM: No plaque or stenosis.  LAD system: Extensive mixed plaque with severe (70-90%) stenosis in the proximal LAD. D1 and D2 are small to moderate vessels and appear to have significant mixed plaque proximally (70-90% stenosis).  Circumflex system: Relatively small vessel, no plaque or stenosis.  RCA system: The mid RCA is obscured by motion artifact in all phases. However, there is mixed plaque in this area as well. Cannot rule out severe (70-90%) stenosis.  IMPRESSION: 1. Coronary artery calcium score 448 Agatston units. This places the patient in the 98th percentile for age and gender, suggesting high risk of future cardiac events.  2. Suspected severe proximal LAD stenosis.  There also appears to be significant disease in small-moderate D1 and D2.  3. Mid RCA is obscured by motion artifact but also has significant plaque. Possible severe (70-90%) stenosis.  Will send for FFR.  Dalton Mclean   Electronically Signed By: Marca Ancona M.D. On: 10/06/2018 16:20  Narrative EXAM: OVER-READ INTERPRETATION  CT CHEST  The following report is an over-read performed by radiologist Dr. Irish Lack of  [facility] on 10/06/2018. This over-read does not include interpretation of cardiac or coronary anatomy or pathology. The coronary CTA interpretation by the cardiologist is attached.  COMPARISON:  None.  FINDINGS: Vascular: No incidental noncardiac vascular findings.  Mediastinum/Nodes: Visualized mediastinum and hilar regions show no lymphadenopathy or masses.  Lungs/Pleura: Visualized lungs show no evidence of pulmonary edema, consolidation, pneumothorax, nodule or pleural fluid.  Upper Abdomen: No acute abnormality.  Musculoskeletal: No chest wall mass or suspicious bone lesions identified.  IMPRESSION: No significant incidental findings.  Electronically Signed: By: Irish Lack M.D. On: 10/06/2018 10:06          EKG:   EKG Interpretation Date/Time:  Monday April 26 2023 11:08:35 EST Ventricular Rate:  94 PR Interval:  144 QRS Duration:  124 QT Interval:  356 QTC Calculation: 445 R Axis:   29  Text Interpretation: Normal sinus rhythm Possible Left atrial enlargement Right bundle branch block When compared with ECG of 08-Aug-2022 02:34, PREVIOUS ECG IS PRESENT No significant change was found Confirmed by Danny Brewer [phone]) on 04/26/2023 11:20:17 AM    Recent Labs: 08/08/2022: B Natriuretic Peptide 33.0 02/13/2023: ALT 20 02/14/2023: BUN 13; Creatinine, Ser 1.29; Hemoglobin 13.6; Magnesium 1.9; Platelets 204; Potassium 3.6; Sodium 135  Recent Lipid Panel    Component Value Date/Time   CHOL 122 12/29/2022 1022   TRIG 58 12/29/2022 1022   HDL 62 12/29/2022 1022   CHOLHDL 2.0 12/29/2022 1022   CHOLHDL 4.3 09/19/2018 0243   VLDL 13 09/19/2018 0243   LDLCALC 47 12/29/2022 1022     Risk Assessment/Calculations:                Physical Exam:    VS:  BP 118/60 (BP Location: Left Arm, Patient Position: Sitting, Cuff Size: Normal)   Pulse 94   Ht 5\' 9"  (1.753 m)   Wt 181 lb (82.1 kg)   SpO2 97%   BMI 26.73 kg/m     Wt Readings from  Last 3 Encounters:  04/26/23 181 lb (82.1 kg)  02/25/23 178 lb 3.2 oz (80.8 kg)  02/13/23 176 lb (79.8 kg)     GEN:  Well nourished, well developed in no acute distress HEENT: Normal NECK: No JVD; No carotid bruits LYMPHATICS: No lymphadenopathy CARDIAC: RRR, no murmurs, rubs, gallops RESPIRATORY:  Clear to auscultation without rales, wheezing or rhonchi  ABDOMEN: Soft, non-tender, non-distended MUSCULOSKELETAL:  No edema; No deformity  SKIN: Warm and dry NEUROLOGIC:  Alert and oriented x 3 PSYCHIATRIC:  Normal affect   Assessment & Plan Tachycardia, paroxysmal (HCC) Patient had abrupt onset of tachycardia with associated symptoms of heart racing, palpitation, and arm discomfort.  This was a one-time event and he did not recall any inciting factors.  He has had no recurrence over the past week.  We discussed potential options including medical therapy with a beta-blocker, testing with a ZIO monitor.  After shared decision making conversation, we agree that we will just keep an eye on this.  He will continue with his routine scheduled follow-up and will reach out  if he has any problems.  I told him about the option of a Kardia or Alivecor monitor as well. He has been intolerant to beta blockers so I did not prescribe any scheduled dose or prn beta blocker today. His EKG is reviewed and shows no changes from baseline.             Medication Adjustments/Labs and Tests Ordered: Current medicines are reviewed at length with the patient today.  Concerns regarding medicines are outlined above.  Orders Placed This Encounter  Procedures   EKG 12-Lead   No orders of the defined types were placed in this encounter.   Patient Instructions  Follow-Up: At [facility], you and your health needs are our priority.  As part of our continuing mission to provide you with exceptional heart care, we have created designated Provider Care Teams.  These Care Teams include your primary  Cardiologist (physician) and Advanced Practice Providers (APPs -  Physician Assistants and Nurse Practitioners) who all work together to provide you with the care you need, when you need it.  Your next appointment:   As scheduled/planned  Provider:   Tonny Bollman, MD       Other Instructions [facility] device   Signed, Danny Bollman, MD  04/26/2023 11:35 AM    [facility]

## 2023-05-03 ENCOUNTER — Ambulatory Visit: Payer: BC Managed Care – PPO | Admitting: Cardiothoracic Surgery

## 2023-05-04 ENCOUNTER — Other Ambulatory Visit (HOSPITAL_COMMUNITY): Payer: Self-pay | Admitting: Family Medicine

## 2023-05-04 DIAGNOSIS — R221 Localized swelling, mass and lump, neck: Secondary | ICD-10-CM

## 2023-05-05 ENCOUNTER — Ambulatory Visit (HOSPITAL_COMMUNITY)
Admission: RE | Admit: 2023-05-05 | Discharge: 2023-05-05 | Disposition: A | Discharge status: Home / Self Care | Payer: BC Managed Care – PPO | Source: Ambulatory Visit | Attending: Family Medicine | Admitting: Family Medicine

## 2023-05-05 DIAGNOSIS — R221 Localized swelling, mass and lump, neck: Secondary | ICD-10-CM | POA: Insufficient documentation

## 2023-05-10 ENCOUNTER — Ambulatory Visit (INDEPENDENT_AMBULATORY_CARE_PROVIDER_SITE_OTHER): Payer: BC Managed Care – PPO | Admitting: Cardiothoracic Surgery

## 2023-05-10 ENCOUNTER — Encounter: Payer: Self-pay | Admitting: Cardiothoracic Surgery

## 2023-05-10 VITALS — BP 132/95 | HR 100 | Resp 18 | Ht 69.0 in | Wt 175.0 lb

## 2023-05-10 DIAGNOSIS — L905 Scar conditions and fibrosis of skin: Secondary | ICD-10-CM | POA: Diagnosis not present

## 2023-05-10 DIAGNOSIS — M792 Neuralgia and neuritis, unspecified: Secondary | ICD-10-CM | POA: Diagnosis not present

## 2023-05-10 DIAGNOSIS — Z9889 Other specified postprocedural states: Secondary | ICD-10-CM | POA: Insufficient documentation

## 2023-05-10 NOTE — Progress Notes (Signed)
HPI: The patient returns for scheduled 57-month follow-up of his neuropathic pain following sternotomy for CABG by Dr. Tyrone Sage in 2020.  Previous CT scan of the chest demonstrated no evidence of fibrous malunion in the incision has never been infected.  The patient has type 2 diabetes on Jardiance.  Patient states that as long as he does not lift his arms up and place any object over his head that his symptoms are tolerated.  He denies any click or pop in the sternum with rotational movement.  He is able to drive but not to lift anything of significant weight above his shoulders.  He remains disabled from returning to his previous occupation. Current Outpatient Medications  Medication Sig Dispense Refill   acetaminophen (TYLENOL) 500 MG tablet Take 2 tablets (1,000 mg total) by mouth every 6 (six) hours. (Patient taking differently: Take 1,000 mg by mouth every 6 (six) hours as needed for moderate pain (pain score 4-6).) 50 tablet 0   amLODipine (NORVASC) 10 MG tablet Take 10 mg by mouth at bedtime.     aspirin 81 MG tablet Take 1 tablet (81 mg total) by mouth daily.     Bempedoic Acid-Ezetimibe (NEXLIZET) 180-10 MG TABS Take 1 tablet by mouth daily. 90 tablet 3   bisacodyl (DULCOLAX) 5 MG EC tablet Take 2 tablets (10 mg total) by mouth daily as needed for moderate constipation. 30 tablet 0   cyclobenzaprine (FLEXERIL) 5 MG tablet Take 1 tablet (5 mg total) by mouth 3 (three) times daily. 30 tablet 0   diazepam (VALIUM) 2 MG tablet Take 1 tablet (2 mg total) by mouth every 12 (twelve) hours. 20 tablet 0   empagliflozin (JARDIANCE) 25 MG TABS tablet Take 1 tablet (25 mg total) by mouth daily before breakfast. 90 tablet 3   Evolocumab (REPATHA SURECLICK) 140 MG/ML SOAJ Inject 140 mg into the skin every 14 (fourteen) days. 6 mL 3   gabapentin (NEURONTIN) 100 MG capsule Take 2 capsules (200 mg total) by mouth 3 (three) times daily. 90 capsule 0   senna-docusate (SENOKOT-S) 8.6-50 MG tablet Take 1 tablet  by mouth 2 (two) times daily. While taking strongest pain meds to prevent constipation 10 tablet 0   tamsulosin (FLOMAX) 0.4 MG CAPS capsule Take 0.4 mg by mouth daily.     Current Facility-Administered Medications  Medication Dose Route Frequency Provider Last Rate Last Admin   incobotulinumtoxinA (XEOMIN) 100 units injection 70 Units  70 Units Intramuscular Q90 days Asa Lente, MD   70 Units at 12/09/22 1438     Physical Exam: Blood pressure (!) 132/95, pulse 100, resp. rate 18, height 5\' 9"  (1.753 m), weight 175 lb (79.4 kg), SpO2 96%.         Exam    General- alert and comfortable, the sternal incision remains well-healed with some hypersensitivity over the scar.  Raising his arms above his head against pressure elicits symptomatic pain.    Neck- no JVD, no cervical adenopathy palpable, no carotid bruit.  In his left neck he has a soft tissue/calcified density at the angle of the mandible.  He is being evaluated for this by ENT.   Lungs- clear without rales, wheezes   Cor- regular rate and rhythm, no murmur , gallop   Abdomen- soft, non-tender   Extremities - warm, non-tender, minimal edema   Neuro- oriented, appropriate, no focal weakness  Diagnostic Tests: I reviewed the most recent CT scan of the chest showing the sternal closure to be intact.  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  As long as the patient does not lift any objects above his shoulders he is able to carry out the activities of daily living.  He is not on narcotics or tricyclic antidepressants.  He uses Tylenol as needed.  Patient is disabled from work because of his  Neuropathic pain limiting his ability to lift objects above his shoulders. Plan: Return in 6 months for follow-up and reassessment.   Lovett Sox, MD [facility] [phone]

## 2023-05-21 ENCOUNTER — Other Ambulatory Visit: Payer: Self-pay | Admitting: Physician Assistant

## 2023-05-21 NOTE — Telephone Encounter (Signed)
For review: Filled in hospital, are we managing?? Thank you

## 2023-05-21 NOTE — Telephone Encounter (Signed)
Pt seen in clinic by Excell Seltzer on 02/25/23 and again as work-in on 04/26/23. Amlodipine on active medication list at that time and no changes made to regimen. Will send refill to requesting pharmacy at this time.

## 2023-05-25 ENCOUNTER — Encounter (INDEPENDENT_AMBULATORY_CARE_PROVIDER_SITE_OTHER): Payer: Self-pay | Admitting: Otolaryngology

## 2023-05-25 ENCOUNTER — Ambulatory Visit (INDEPENDENT_AMBULATORY_CARE_PROVIDER_SITE_OTHER): Payer: BC Managed Care – PPO | Admitting: Otolaryngology

## 2023-05-25 VITALS — BP 135/92 | HR 97 | Ht 69.0 in | Wt 182.0 lb

## 2023-05-25 DIAGNOSIS — K219 Gastro-esophageal reflux disease without esophagitis: Secondary | ICD-10-CM

## 2023-05-25 DIAGNOSIS — R0981 Nasal congestion: Secondary | ICD-10-CM | POA: Diagnosis not present

## 2023-05-25 DIAGNOSIS — H9202 Otalgia, left ear: Secondary | ICD-10-CM | POA: Diagnosis not present

## 2023-05-25 DIAGNOSIS — G5139 Clonic hemifacial spasm, unspecified: Secondary | ICD-10-CM

## 2023-05-25 DIAGNOSIS — R0982 Postnasal drip: Secondary | ICD-10-CM

## 2023-05-25 DIAGNOSIS — J351 Hypertrophy of tonsils: Secondary | ICD-10-CM

## 2023-05-25 DIAGNOSIS — R221 Localized swelling, mass and lump, neck: Secondary | ICD-10-CM | POA: Diagnosis not present

## 2023-05-25 DIAGNOSIS — J3089 Other allergic rhinitis: Secondary | ICD-10-CM

## 2023-05-25 MED ORDER — FLUTICASONE PROPIONATE 50 MCG/ACT NA SUSP: 2.0000 | Freq: Every day | NASAL | 6 refills | Status: DC

## 2023-05-25 MED ORDER — CETIRIZINE HCL 10 MG PO TABS: 10.0000 mg | ORAL_TABLET | Freq: Every day | ORAL | 11 refills | Status: DC

## 2023-05-25 NOTE — Progress Notes (Signed)
ENT CONSULT:  Reason for Consult: left neck mass   HPI: Discussed the use of AI scribe software for clinical note transcription with the patient, who gave verbal consent to proceed.  History of Present Illness   The patient is a 52 yoM, with a history of facial spasms managed with Botox injections, CAD, s/p CABG 2020 f/b Cardiology, hx of dysphagia sx which resolved following EGD and esophageal stretching per patient report, presented with a hard, painful lump near the left angle of the mandible present for about 1 year, with gradual increase in size.   The lump was first noticed while shaving approximately a year ago and has since increased in size, becoming larger over time. The patient reports that the lump has become increasingly irritating and painful, causing discomfort in the throat and ear. Despite the location of the lump, the patient denies any difficulty swallowing. No trismus or facial weakness/paresthesias.  The patient has a history of salivary gland stones but denies any previous salivary gland infections or treatments. The patient also denies any history of smoking or recent dental infections/odontogenic issues or pain. The patient has noticed another lump on the neck, which has been present for a long time but has not grown significantly and is more superficial close to the left clavicle.   The patient has been under the care of a neurologist for left sided facial spasms. This condition has been managed with Botox injections every three to six months, which have been effective in controlling the spasms. The patient denies any other head and neck issues, such as thyroid problems or radiation to the head. No hx of surgery or radiation to the head and neck.    Records Reviewed:  D/c summary 02/15/23 - admitted for nephrolithiasis - outpatient Urology GI f/u recommended   02/17/23 GI office visit  HPI: Danny Brewer. is a 52 y.o. male, who returns for post ER follow-up. He was seen  in the [facility] ER on 02/13/23. He had severe periumbilical pain with radiation to the RLQ initial presentation of peritoneal signs. He reports that the pain is a 3-8/10. He reports that Labs and CT scan were unremarkable. He was admitted for observation. Pain felt to be musculoskeletal in nature. Has family history of colon cancer and remote history of colonic polyps. Last EGD/colonoscopy 03/23/2019 without frank findings. 5 year recall suggested due to family history in brother. Due for colonoscopy in 2025. He was discharged home from ER with prescription for flexeril and told to follow-up with [place]. Bowel habits are currently normal. He reports that he is passing a stool daily to every other. He reports that when he passes a stool he feels like pain is slightly improved. He reports no straining to pass the stools. No bloody stools or black colored stools. No diarrhea. He reports appetite is normal. Reports Weight loss of 9 lbs since April. He is not taking Ozempic. Per his wife he has been going through a particularly tough time emotionally recently. Has been under a lot of stress and had a falling out with some folks that he was particularly close to. Plans to seek the care of a therapist. Has already been in contact, but has been unable to be seen by them.    Cardiology office visit 04/26/23 Danny Brewer is a 52 y.o. male with a hx of:   Coronary artery disease  s/p CABG 10/2018 Echocardiogram 09/19/18: EF 60-65 Intol of beta-blocker  Myoview 03/20/2022: EF 48, normal perfusion, low  risk Echo 03/25/2022 EF 60-65, no RWMA, normal RVSF, trivial MR Diabetes mellitus 2 Hypertension  Hyperlipidemia  Intol of statins >> Rx w Alirocumab (Praluent) Carotid stenosis  [place] 10/21/20: Bilateral ICA 1-39 [place] 03/21/22: no ICA stenosis Bilat ED  Nephrolithiasis (s/p stone extraction in July 2023)    Cardiothoracic surgery office notes 05/10/23 The patient returns for scheduled 58-month follow-up of his  neuropathic pain following sternotomy for CABG by Dr. Tyrone Sage in 2020. Previous CT scan of the chest demonstrated no evidence of fibrous malunion in the incision has never been infected. The patient has type 2 diabetes on Jardiance.   Diagnostic Tests: I reviewed the most recent CT scan of the chest showing the sternal closure to be intact.   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  As long as the patient does not lift any objects above his shoulders he is able to carry out the activities of daily living.  He is not on narcotics or tricyclic antidepressants.  He uses Tylenol as needed.  Patient is disabled from work because of his  Neuropathic pain limiting his ability to lift objects above his shoulders. Plan: Return in 6 months for follow-up and reassessment.    Past Medical History:  Diagnosis Date   BPH (benign prostatic hyperplasia)    Coronary Artery Disease    cardiologist--- dr Excell Seltzer;    Coronary CTA 09/2018: pLAD 70-90; pD1+pD2 70-90; mRCA mixed plaque - cannot rule out 70-90; FFR suggests hemodynamically significant stenosis // LHC 10/2018: 2v CAD >> s/p  CABG (L-LAD, L radial-RI, S-D2, S-RCA) with Dr. 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   LIMA--LAD;  SVG--D2;  SVG--dRCA;  left radial --- RI   Type 2 diabetes mellitus (HCC)    followed by pcp   (08-05-2022 checks blood sugar daily am fasting,  fasting sugar-- 77-100)   Wears glasses     Past Surgical History:  Procedure Laterality Date   CORONARY ARTERY BYPASS GRAFT N/A 10/17/2018   Procedure: CORONARY ARTERY BYPASS GRAFTING (CABG), ON PUMP, TIMES FOUR, USING LIMA TO LAD, ENDOSCOPICALLY HARVESTED GREATER  SAPHENOUS VEIN TO DIAG 2, SVG TO DISTAL RCA, AND LEFT RADIAL TO RAMUS INTERMEDIUS;  Surgeon: [name], MD;  Location: [facility] OR;  Service: Open Heart Surgery;  Laterality: N/A;   CYSTOSCOPY WITH RETROGRADE PYELOGRAM, URETEROSCOPY AND STENT PLACEMENT Right 12/30/2021   Procedure: CYSTOSCOPY WITH RETROGRADE PYELOGRAM, URETEROSCOPY AND STENT PLACEMENT, BASKET OF STONES;  Surgeon: Sebastian Ache, MD;  Location: [facility];  Service: Urology;  Laterality: Right;   CYSTOSCOPY WITH RETROGRADE PYELOGRAM, URETEROSCOPY AND STENT PLACEMENT Left 08/07/2022   Procedure: CYSTOSCOPY WITH RETROGRADE PYELOGRAM, URETEROSCOPY AND STENT PLACEMENT;  Surgeon: Sebastian Ache, MD;  Location: [facility];  Service: Urology;  Laterality: Left;  1 HR   ENDOVEIN HARVEST OF GREATER SAPHENOUS VEIN Right 10/17/2018   Procedure: Mack Guise Of Greater Saphenous Vein;  Surgeon: [name], MD;  Location: [facility] OR;  Service: Open Heart Surgery;  Laterality: Right;   HAND SURGERY Left 1987   complex laceration   HOLMIUM LASER APPLICATION Left 08/07/2022   Procedure:  HOLMIUM LASER APPLICATION;  Surgeon: Sebastian Ache, MD;  Location: [facility];  Service: Urology;  Laterality: Left;   INGUINAL HERNIA REPAIR Bilateral 05/27/2015   Procedure: LAPAROSCOPIC BILATERAL  INGUINAL HERNIA REPAIRS WITH MESH;  Surgeon: Karie Soda, MD;  Location: [facility];  Service: General;  Laterality: Bilateral;   KNEE ARTHROSCOPY Left 1991   LEFT HEART CATH AND CORONARY ANGIOGRAPHY N/A 10/14/2018   Procedure: LEFT HEART CATH AND CORONARY ANGIOGRAPHY;  Surgeon: Lyn Records, MD;  Location: [facility];  Service: Cardiovascular;  Laterality: N/A;   PENILE PROSTHESIS IMPLANT N/A 10/03/2020   Procedure: PLACEMENT OF PENILE PROTHESIS INFLATABLE;  Surgeon: Marcine Matar, MD;  Location: [facility];  Service: Urology;  Laterality: N/A;  pubis area   RADIAL ARTERY HARVEST Left 10/17/2018   Procedure: RADIAL ARTERY HARVEST;  Surgeon:  [name], MD;  Location: [facility] OR;  Service: Open Heart Surgery;  Laterality: Left;   REMOVAL OF PENILE PROSTHESIS N/A 07/07/2021   Procedure: EXPLANT  AND REPLACEMENT OF COLOPLAST TITAN PENILE PROSTHESIS;  Surgeon: Marcine Matar, MD;  Location: [facility];  Service: Urology;  Laterality: N/A;   SCROTAL EXPLORATION N/A 07/07/2021   Procedure: EXPLORATION OF PENILE PROSTHESIS;  Surgeon: Marcine Matar, MD;  Location: [facility];  Service: Urology;  Laterality: N/A;   SHOULDER ARTHROSCOPY Right 07/08/2022   @[facility]  by dr supple;   for impingment   SHOULDER CLOSED REDUCTION Left 09/04/2019   Procedure: CLOSED MANIPULATION SHOULDER;  Surgeon: Dannielle Huh, MD;  Location: [facility];  Service: Orthopedics;  Laterality: Left;   SHOULDER SURGERY Left 2022   TEE WITHOUT CARDIOVERSION N/A 10/17/2018   Procedure: TRANSESOPHAGEAL ECHOCARDIOGRAM (TEE);  Surgeon: [name], MD;  Location: [facility] OR;  Service: Open Heart Surgery;  Laterality: N/A;    Family History  Problem Relation Age of Onset   Hypertension Mother    Cancer Father    Heart failure Father    Sudden death Brother    Heart attack Brother    Hypertension Sister    Heart murmur Sister     Social History:  reports that he has never smoked. He has never used smokeless tobacco. He reports that he does not drink alcohol and does not use drugs.  Allergies:  Allergies  Allergen Reactions   Antihistamines, Diphenhydramine-Type Hives and Other (See Comments)    Seizures   Bactrim [Sulfamethoxazole-Trimethoprim] Itching    Throat itching and suspected pill esophagitis   Demerol Hives   Morphine And Codeine Hives    Esp with high dose morphine, possibly Dilaudid Tolerates hydrocodone, oxycodone   Compazine [Prochlorperazine] Other (See Comments)    Spastic movement   Metoprolol     Per patient caused "severe headache"   Morphine Other (See Comments)   Oxycodone     Alters Personality   Statins Other (See Comments)     Myalgias on atorvastatin 20-80mg  daily and rosuvastatin 10mg  daily    Medications: I have reviewed the patient's current medications.  The PMH, PSH, Medications, Allergies, and SH were reviewed and updated.  ROS: Constitutional: Negative for fever, weight loss and weight gain. Cardiovascular: Negative for chest pain and dyspnea on exertion. Respiratory: Is not experiencing shortness of breath at rest. Gastrointestinal: Negative for nausea and vomiting. Neurological: Negative for headaches. Psychiatric: The patient is not nervous/anxious  Blood pressure (!) 135/92, pulse 97, height 5\' 9"  (1.753 m), weight 182 lb (82.6 kg), SpO2 99%.  PHYSICAL EXAM:  Exam: General: Well-developed, well-nourished Communication and Voice: Clear  pitch and clarity Respiratory Respiratory effort: Equal inspiration and expiration without stridor Cardiovascular Peripheral Vascular: Warm extremities with equal color/perfusion Eyes: No nystagmus with equal extraocular motion bilaterally Neuro/Psych/Balance: Patient oriented to person, place, and time; Appropriate mood and affect; Gait is intact with no imbalance; Cranial nerves I-XII are intact Head and Face Inspection: Normocephalic and atraumatic without mass or lesion Palpation: Facial skeleton intact without bony stepoffs Salivary Glands: No mass or tenderness Facial Strength: Facial motility symmetric and full bilaterally ENT Pinna: External ear intact and fully developed External canal: Canal is patent with intact skin Tympanic Membrane: Clear and mobile External Nose: No scar or anatomic deformity Internal Nose: Septum is relatively straight. No polyp, or purulence. Mucosal edema and erythema present.  Bilateral inferior turbinate hypertrophy.  Lips, Teeth, and gums: Mucosa and teeth intact and viable TMJ: No pain to palpation with full mobility Oral cavity/oropharynx: No erythema or exudate, no lesions present, 1+ tonsils right one slightly  larger, but overall symmetric  Nasopharynx: No mass or lesion with intact mucosa Hypopharynx: Intact mucosa without pooling of secretions Larynx Glottic: Full true vocal cord mobility without lesion or mass Supraglottic: Normal appearing epiglottis and AE folds Interarytenoid Space: Moderate pachydermia&edema Subglottic Space: Patent without lesion or edema Neck Neck and Trachea: Midline trachea without mass or lesion Thyroid: No mass or nodularity Lymphatics: No lymphadenopathy, palpable firm mass near left angle of the mandible about 1-1.5 cm on exam no overlying skin changes A small subcutaneous 1 cm nodule left lower neck near clavicle, likely sebaceous cyst vs lipoma   Procedure: Preoperative diagnosis: neck mass, hx of dysphagia resolved after EGD and dilation   Postoperative diagnosis:   Same  Procedure: Flexible fiberoptic laryngoscopy  Surgeon: Ashok Croon, MD  Anesthesia: Topical lidocaine and Afrin Complications: None Condition is stable throughout exam  Indications and consent:  The patient presents to the clinic. Indirect laryngoscopy view was incomplete. Thus it was recommended that they undergo a flexible fiberoptic laryngoscopy. All of the risks, benefits, and potential complications were reviewed with the patient preoperatively and verbal informed consent was obtained.  Procedure: The patient was seated upright in the clinic. Topical lidocaine and Afrin were applied to the nasal cavity. After adequate anesthesia had occurred, I then proceeded to pass the flexible telescope into the nasal cavity. The nasal cavity was patent without rhinorrhea or polyp. The nasopharynx was also patent without mass or lesion. The base of tongue was visualized and was normal. There were no signs of pooling of secretions in the piriform sinuses. The true vocal folds were mobile bilaterally. There were no signs of glottic or supraglottic mucosal lesion or mass. There was moderate  interarytenoid pachydermia and post cricoid edema. The telescope was then slowly withdrawn and the patient tolerated the procedure throughout.  Studies Reviewed: Soft tissue neck U/S HISTORY: Patient is a 52 y/o M with left submandibular subcutaneous [place] mass of neck x two months.   COMPARISON: None.   TECHNIQUE: Two-dimensional grayscale and color Doppler ultrasound of the of the left submandibular region of interest was performed.   FINDINGS: There is a 1.6 x 1.2 x 1.5 cm avascular, hypoechoic lesion with increased through transmission identified within the left submandibular, area of concern. No focal fluid collections are demonstrated.   IMPRESSION: 1. Left submandibular avascular, hypoechoic 1.6 cm lesion. This appears cystic. Tissue sampling may be of benefit for further assessment.    Assessment/Plan: Encounter Diagnoses  Name Primary?   Neck mass Yes   Chronic GERD  Tonsillar hypertrophy    Chronic nasal congestion    Post-nasal drip    Referred otalgia of left ear    Environmental and seasonal allergies     Assessment and Plan    Left neck mass Presents with a progressively enlarging and hardening left neck mass at the left angle of the mandible over the past several months. The mass is painful, causing throat and ear discomfort. The mass is likely unrelated to the salivary gland based on its lateral location on exam - superficial mass palpated at the left angle of mandible, and a small subcutaneous lesion likely sebaceous cyst or lipoma at the anterior left neck near clavicle. Differential diagnosis includes lymph node, cyst, benign vs malignant neoplasm. Discussed the need for a CT scan to further evaluate the mass.  - Order CT neck with contrast  - Schedule follow-up appointment after CT neck  Chronic nasal congestion/post-nasal drip and suspect environmental allergies - evidence of mucosal edema and clear secretions but no pus or purulence  - Zyrtec  10 mg daily and Flonase 2 puffs b/l nares BID  Otalgia left side referred pain from left angle of mandible mass  - ear exam unremarkable - will re-assess after CT neck   Facial Spasm Facial spasms due to a blood vessel pressing against a nerve, managed with Botox injections every 3-6 months. Spasms affect the eye and cheek area. Botox injections have been effective in managing symptoms. - Continue Botox injections as scheduled  Follow-up - Schedule CT scan through [facility] - Follow up with ENT after CT scan results.        Thank you for allowing me to participate in the care of this patient. Please do not hesitate to contact me with any questions or concerns.   Ashok Croon, MD Otolaryngology [facility] Phone: [phone] Fax: [phone]    05/25/2023, 7:20 PM

## 2023-05-25 NOTE — Patient Instructions (Signed)
-   schedule CT neck with contrast  - return after testing

## 2023-05-28 ENCOUNTER — Telehealth: Payer: Self-pay | Admitting: Cardiovascular Disease

## 2023-05-28 ENCOUNTER — Encounter (HOSPITAL_COMMUNITY): Payer: Self-pay | Admitting: *Deleted

## 2023-05-28 ENCOUNTER — Emergency Department (HOSPITAL_COMMUNITY)
Admission: EM | Admit: 2023-05-28 | Discharge: 2023-05-28 | Disposition: A | Discharge status: Home / Self Care | Payer: BC Managed Care – PPO

## 2023-05-28 ENCOUNTER — Other Ambulatory Visit: Payer: Self-pay

## 2023-05-28 ENCOUNTER — Emergency Department (HOSPITAL_COMMUNITY): Payer: BC Managed Care – PPO

## 2023-05-28 DIAGNOSIS — R072 Precordial pain: Secondary | ICD-10-CM | POA: Diagnosis not present

## 2023-05-28 DIAGNOSIS — R42 Dizziness and giddiness: Secondary | ICD-10-CM | POA: Insufficient documentation

## 2023-05-28 DIAGNOSIS — I10 Essential (primary) hypertension: Secondary | ICD-10-CM | POA: Insufficient documentation

## 2023-05-28 DIAGNOSIS — I2510 Atherosclerotic heart disease of native coronary artery without angina pectoris: Secondary | ICD-10-CM | POA: Diagnosis not present

## 2023-05-28 DIAGNOSIS — Z955 Presence of coronary angioplasty implant and graft: Secondary | ICD-10-CM | POA: Diagnosis not present

## 2023-05-28 DIAGNOSIS — E119 Type 2 diabetes mellitus without complications: Secondary | ICD-10-CM | POA: Diagnosis not present

## 2023-05-28 DIAGNOSIS — R079 Chest pain, unspecified: Secondary | ICD-10-CM | POA: Diagnosis present

## 2023-05-28 DIAGNOSIS — R55 Syncope and collapse: Secondary | ICD-10-CM | POA: Diagnosis not present

## 2023-05-28 LAB — CBC
HCT: 47.5 % (ref 39.0–52.0)
Hemoglobin: 14.9 g/dL (ref 13.0–17.0)
MCH: 27.7 pg (ref 26.0–34.0)
MCHC: 31.4 g/dL (ref 30.0–36.0)
MCV: 88.3 fL (ref 80.0–100.0)
Platelets: 256 10*3/uL (ref 150–400)
RBC: 5.38 MIL/uL (ref 4.22–5.81)
RDW: 12.9 % (ref 11.5–15.5)
WBC: 7.9 10*3/uL (ref 4.0–10.5)
nRBC: 0 % (ref 0.0–0.2)

## 2023-05-28 LAB — TROPONIN I (HIGH SENSITIVITY)
Troponin I (High Sensitivity): 3 ng/L (ref <18)
Troponin I (High Sensitivity): 4 ng/L (ref <18)

## 2023-05-28 LAB — BASIC METABOLIC PANEL
Anion gap: 8 (ref 5–15)
BUN: 13 mg/dL (ref 6–20)
CO2: 24 mmol/L (ref 22–32)
Calcium: 8.7 mg/dL — ABNORMAL LOW (ref 8.9–10.3)
Chloride: 107 mmol/L (ref 98–111)
Creatinine, Ser: 1.22 mg/dL (ref 0.61–1.24)
GFR, Estimated: >60 mL/min (ref >60)
Glucose, Bld: 91 mg/dL (ref 70–99)
Potassium: 4.3 mmol/L (ref 3.5–5.1)
Sodium: 139 mmol/L (ref 135–145)

## 2023-05-28 NOTE — Telephone Encounter (Signed)
   Pt c/o of Chest Pain: STAT if active CP, including tightness, pressure, jaw pain, radiating pain to shoulder/upper arm/back, CP unrelieved by Nitro. Symptoms reported of SOB, nausea, vomiting, sweating.  1. Are you having CP right now?   Chest tightness   2. Are you experiencing any other symptoms (ex. SOB, nausea, vomiting, sweating)?  Nauseous about an hour ago  3. Is your CP continuous or coming and going?   Coming and going  4. Have you taken Nitroglycerin?   No  5. How long have you been experiencing CP?   Patient stated he woke up this morning with what he thinks was acid reflux and about an hour ago he started having chest tightness  6. If NO CP at time of call then end call with telling Pt to call back or call 911 if Chest pain returns prior to return call from triage team.   Patient wants advice on next steps.

## 2023-05-28 NOTE — Telephone Encounter (Signed)
Reports chest tightness that is coming and going, 5 out of 10 pain level, associated w/ nausea with occurrences. He does report that he was lifting a bunch of 5 gallon buckets today.  Pt advised to go to ED for evaluation.  Patient agreeable to plan.

## 2023-05-28 NOTE — ED Provider Notes (Addendum)
[place] EMERGENCY DEPARTMENT AT [facility] Provider Note  HPI   Danny Brewer is a 52 y.o. male patient with a PMHx of  CAD s/p CABG, HTN, HLD, GERD, OSA, BPPV, NIDDM T2, s/p right ureteral stent, erectile dysfunction status post penile prosthesis who is here today with concern for chest pain episode.  Earlier today, patient was lifting some not that heavy jugs at work, shortly after this, felt that he was lightheaded, presyncopal sensation, and had a bilateral chest pressure more so in the middle of his sternum, does not radiate anywhere, lasted for about 15 minutes, described as a pressure.  He has had no recent illness no fevers chills no recent trauma.  No recent urinary symptoms  ROS Negative except as per HPI  Medical Decision Making   Upon presentation, the patient is afebrile hemodynamically stable, blood pressure in the 130s 140s systolic overall he appears well with no calf tenderness or swelling, lungs are clear  Evaluated patient after dialysis this patient, he presents with a high risk chest pain, he was somewhat exertional, middle of his chest, pressure, and was improved with rest.  He has a history of CAD status post quadruple bypass..  Labs were already done prior to me seeing the patient first troponin negative, metabolic panel and hematologic panel are both normal.  Chest x-ray is clear, EKG shows right bundle branch block, but no other overt signs of ischemia, independent review.  I do not this patient is having a pulmonary embolism, no hemoptysis, pain is resolved, no calf pain, no strict cancer, no history of DVT or PE.  This patient is having aortic dissection again he has been symptomatic free for over 6 hours, pulses are intact in all 4 extremities sensations intact in all 4 extremities, no abdominal pain no back pain no focal weakness.    I spoke to the cardiology team, we repeated a EKG, continues show right bundle branch block no other signs of ischemia,  shared decision making made them, and patient, we will discharge the patient at this time.  He understands that he is high risk, however given the fact that he has had no chest pain in the last 8 hours, we feel comfortable discharging this patient.  He did have a stress test a year ago that shown no signs of ischemia.  Cardiology referral placed, he has a cardiologist he will call them and cardiology will also reach out to him.  Will come back with any acute changes   Clinical Course as of 05/28/23 2018  Fri May 28, 2023  1859  CAD s/p CABG, HTN, HLD, GERD, OSA, BPPV, NIDDM T2, s/p right ureteral stent, erectile dysfunction status post penile prosthesis, [JL]    Clinical Course User Index [JL] Gunnar Bulla, MD      At this time, I feel that the patient is medically cleared for discharge and have discussed this with my attending who agrees.  I discussed with the patient and/or family my overall assessment, including my physical exam, labs, imaging, other diagnostic tests, and therapeutics given.  All questions answered and understanding is expressed.  I have instructed to call PCP to establish an outpatient appointment after this ED visit, and necessary specialty follow up if needed. I gave strict return precautions to come back to the ED including fevers, chills, severe pain, worsening of symptoms, return of symptoms, new and concerning symptoms, inability to tolerate p.o. intake, among others. I specifically stated to return if symptoms worsen or  return.  Wife is at bedside, states that she will bring him back if his symptoms return  1. Chest pain, unspecified type   2. Precordial chest pain     @DISPOSITION @  Rx / DC Orders ED Discharge Orders          Ordered    Ambulatory referral to Cardiology       Comments: If you have not heard from the Cardiology office within the next 72 hours please call [phone].   05/28/23 2016             Past Medical History:  Diagnosis Date    BPH (benign prostatic hyperplasia)    Coronary Artery Disease    cardiologist--- dr Excell Seltzer;    Coronary CTA 09/2018: pLAD 70-90; pD1+pD2 70-90; mRCA mixed plaque - cannot rule out 70-90; FFR suggests hemodynamically significant stenosis // LHC 10/2018: 2v CAD >> s/p  CABG (L-LAD, L radial-RI, S-D2, S-RCA) with Dr. 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   LIMA--LAD;  SVG--D2;  SVG--dRCA;  left radial --- RI   Type 2 diabetes mellitus (HCC)    followed by pcp   (08-05-2022 checks blood sugar daily am fasting,  fasting sugar-- 77-100)   Wears glasses    Past Surgical History:  Procedure Laterality Date   CORONARY ARTERY BYPASS GRAFT N/A 10/17/2018   Procedure: CORONARY ARTERY BYPASS GRAFTING (CABG), ON PUMP, TIMES FOUR, USING LIMA TO LAD, ENDOSCOPICALLY HARVESTED GREATER SAPHENOUS VEIN TO DIAG 2, SVG TO DISTAL RCA, AND LEFT RADIAL TO RAMUS INTERMEDIUS;  Surgeon: [name], MD;  Location: [facility] OR;  Service: Open Heart Surgery;  Laterality: N/A;   CYSTOSCOPY WITH RETROGRADE PYELOGRAM, URETEROSCOPY AND STENT PLACEMENT Right 12/30/2021   Procedure: CYSTOSCOPY WITH RETROGRADE PYELOGRAM, URETEROSCOPY AND STENT PLACEMENT, BASKET OF STONES;  Surgeon: Sebastian Ache, MD;  Location: [facility] ORS;  Service: Urology;  Laterality: Right;   CYSTOSCOPY WITH RETROGRADE PYELOGRAM, URETEROSCOPY AND STENT PLACEMENT Left 08/07/2022   Procedure: CYSTOSCOPY WITH RETROGRADE PYELOGRAM, URETEROSCOPY AND STENT PLACEMENT;  Surgeon: Sebastian Ache, MD;  Location: [facility];  Service: Urology;  Laterality: Left;  1 HR   ENDOVEIN HARVEST OF GREATER SAPHENOUS VEIN Right 10/17/2018   Procedure: Mack Guise Of Greater Saphenous Vein;  Surgeon: [name], MD;  Location: [facility] OR;  Service: Open Heart Surgery;  Laterality: Right;   HAND SURGERY Left 1987   complex laceration   HOLMIUM LASER APPLICATION Left 08/07/2022   Procedure: HOLMIUM LASER APPLICATION;  Surgeon: Sebastian Ache, MD;  Location: [facility];  Service: Urology;  Laterality: Left;   INGUINAL HERNIA REPAIR Bilateral 05/27/2015   Procedure: LAPAROSCOPIC BILATERAL  INGUINAL HERNIA REPAIRS WITH MESH;  Surgeon: Karie Soda, MD;  Location: [facility];  Service: General;  Laterality: Bilateral;   KNEE ARTHROSCOPY Left 1991   LEFT HEART CATH AND CORONARY ANGIOGRAPHY N/A 10/14/2018   Procedure: LEFT HEART CATH AND CORONARY ANGIOGRAPHY;  Surgeon: Lyn Records, MD;  Location: [facility] INVAS[facility];  Service: Cardiovascular;  Laterality: N/A;   PENILE PROSTHESIS IMPLANT N/A 10/03/2020   Procedure: PLACEMENT OF PENILE PROTHESIS INFLATABLE;  Surgeon: Marcine Matar,  MD;  Location:  SURGERY CENTER;  Service: Urology;  Laterality: N/A;  pubis area   RADIAL ARTERY HARVEST Left 10/17/2018   Procedure: RADIAL ARTERY HARVEST;  Surgeon: [name], MD;  Location[facility] OR;  Service: Open Heart Surgery;  Laterality: Left;   REMOVAL OF PENILE PROSTHESIS N/A 07/07/2021   Procedure: EXPLANT  AND REPLACEMENT OF COLOPLAST TITAN PENILE PROSTHESIS;  Surgeon: Marcine Matar, MD;  Location: [facility];  Service: Urology;  Laterality: N/A;   SCROTAL EXPLORATION N/A 07/07/2021   Procedure: EXPLORATION OF PENILE PROSTHESIS;  Surgeon: Marcine Matar, MD;  Location: [facility];  Service: Urology;  Laterality: N/A;   SHOULDER ARTHROSCOPY Right 07/08/2022   @[facility]  by dr supple;   for impingment   SHOULDER CLOSED REDUCTION Left 09/04/2019   Procedure: CLOSED MANIPULATION SHOULDER;  Surgeon: Dannielle Huh, MD;  Location: [facility] OR[facility];  Service: Orthopedics;  Laterality: Left;   SHOULDER SURGERY Left 2022   TEE WITHOUT CARDIOVERSION N/A 10/17/2018   Procedure: TRANSESOPHAGEAL ECHOCARDIOGRAM (TEE);  Surgeon: [name], MD;  Location: [facility] OR;  Service: Open Heart Surgery;  Laterality: N/A;   Family History  Problem Relation Age of Onset   Hypertension Mother    Cancer Father    Heart failure Father    Sudden death Brother    Heart attack Brother    Hypertension Sister    Heart murmur Sister    Social History   Socioeconomic History   Marital status: Married    Spouse name: Not on file   Number of children: Not on file   Years of education: 14   Highest education level: Not on file  Occupational History   Occupation: [employer]  Tobacco Use   Smoking status: Never   Smokeless tobacco: Never  Vaping Use   Vaping status: Never Used  Substance and Sexual Activity   Alcohol use: No   Drug use: Never   Sexual activity: Not on file  Other Topics Concern   Not on file  Social History Narrative   Right handed   Caffeine use: 1 tea/day   Social Drivers of Health   Financial Resource Strain: Not on file  Food Insecurity: No Food Insecurity (02/14/2023)   Hunger Vital Sign    Worried About Running Out of Food in the Last Year: Never true    Ran Out of Food in the Last Year: Never true  Transportation Needs: No Transportation Needs (02/14/2023)   PRAPARE - Administrator, Civil Service (Medical): No    Lack of Transportation (Non-Medical): No  Physical Activity: Not on file  Stress: Not on file  Social Connections: Not on file  Intimate Partner Violence: Not At Risk (02/15/2023)   Humiliation, Afraid, Rape, and Kick questionnaire    Fear of Current or Ex-Partner: No    Emotionally Abused: No    Physically Abused: No    Sexually Abused: No     Physical Exam   Vitals:   05/28/23 1500 05/28/23 1602 05/28/23 1605 05/28/23 2015  BP: 131/89  (!) 134/99 (!) 139/96  Pulse: 88  84 80  Resp: 16  18 19   Temp: 98.4 F (36.9 C)  98.3 F (36.8 C)   SpO2: 100%  100% 100%  Weight:  82.6 kg    Height:  5\' 9"  (1.753 m)      Physical Exam Vitals and nursing note reviewed.   Constitutional:      General: He is not in acute  distress.    Appearance: Normal appearance. He is well-developed. He is not ill-appearing or toxic-appearing.  HENT:     Head: Normocephalic and atraumatic.     Right Ear: External ear normal.     Left Ear: External ear normal.     Nose: Nose normal.     Mouth/Throat:     Mouth: Mucous membranes are moist.  Eyes:     Extraocular Movements: Extraocular movements intact.     Pupils: Pupils are equal, round, and reactive to light.  Cardiovascular:     Rate and Rhythm: Normal rate.     Pulses: Normal pulses.  Pulmonary:     Effort: Pulmonary effort is normal. No respiratory distress.     Breath sounds: Normal breath sounds. No stridor. No wheezing, rhonchi or rales.  Abdominal:     Palpations: Abdomen is soft.     Tenderness: There is no abdominal tenderness. There is no right CVA tenderness or left CVA tenderness.  Musculoskeletal:        General: Normal range of motion.     Cervical back: Normal range of motion and neck supple.  Skin:    General: Skin is warm and dry.     Capillary Refill: Capillary refill takes less than 2 seconds.  Neurological:     General: No focal deficit present.     Mental Status: He is alert and oriented to person, place, and time. Mental status is at baseline.  Psychiatric:        Mood and Affect: Mood normal.      Procedures   If procedures were preformed on this patient, they are listed below:  Procedures  The patient was seen, evaluated, and treated in conjunction with the attending physician, who voiced agreement in the care provided.  Note generated using Dragon voice dictation software and may contain dictation errors. Please contact me for any clarification or with any questions.   Electronically signed by:  Osvaldo Shipper, M.D. (PGY-2)    Gunnar Bulla, MD 05/28/23 2019    Gunnar Bulla, MD 05/28/23 2030    Durwin Glaze, MD 05/28/23 2151

## 2023-05-28 NOTE — ED Provider Triage Note (Signed)
Emergency Medicine Provider Triage Evaluation Note  Danny Brewer , a 52 y.o. male  was evaluated in triage.  Pt complains of chest pain.  States that same began a few hours ago after he did some heavy lifting.  States he does not normally do heavy lifting like that.  He called his doctor who recommended he come here for evaluation given his history of quadruple bypass in 2020.  He states that when his pain began initially, he was having shortness of breath.  States his primary pain has improved but is still about 7/10.  Feels like tightness throughout his chest and does not radiate.  States that when he had his CABG previously, he never had any chest pain he was only feeling fatigued.  Denies leg pain or leg swelling.  Review of Systems  Positive:  Negative:   Physical Exam  BP (!) 134/99   Pulse 84   Temp 98.3 F (36.8 C)   Resp 18   Ht 5\' 9"  (1.753 m)   Wt 82.6 kg   SpO2 100%   BMI 26.89 kg/m  Gen:   Awake, no distress   Resp:  Normal effort  MSK:   Moves extremities without difficulty  Other:    Medical Decision Making  Medically screening exam initiated at 4:20 PM.  Appropriate orders placed.  Harfateh Cleto was informed that the remainder of the evaluation will be completed by another provider, this initial triage assessment does not replace that evaluation, and the importance of remaining in the ED until their evaluation is complete.  Work-up initiated   Vear Clock 05/28/23 1623

## 2023-05-28 NOTE — Discharge Instructions (Signed)
You have been seen here in the emergency department for chest pain. We have obtained a full history, performed a physical exam, in addition to other diagnostic tests and treatments. Right now, we feel that you are safe for discharge from a medical perspective, and do not have an acute life threatening illness.   To do: 1.) Take all medications as prescribed.   2.) If anything changes, or you develop fevers, chills, inability to eat or drink, severe pain, new symptoms, return of symptoms, worsening of symptoms, or any other concerns, please call 911 or come back to the emergency department as soon as possible.   3.) Please make an appointment with your primary care doctor for a follow-up visit after being seen here in the emergency department. If you do not have a primary care doctor, you can call [phone] for assistance in finding one or your health care insurance company.   4.) Cardiology should reach out to you, if not please call your cardiology office, you will need close follow-up  Thank you for allowing me to take care of you today. We hope that you feel better soon.

## 2023-05-28 NOTE — ED Triage Notes (Signed)
The pt is having chest pain since yesterday cabg 2020 no blood thinners    dizziness today

## 2023-06-10 ENCOUNTER — Other Ambulatory Visit (INDEPENDENT_AMBULATORY_CARE_PROVIDER_SITE_OTHER): Payer: Self-pay | Admitting: Otolaryngology

## 2023-06-10 ENCOUNTER — Ambulatory Visit (HOSPITAL_COMMUNITY)
Admission: RE | Admit: 2023-06-10 | Discharge: 2023-06-10 | Disposition: A | Discharge status: Home / Self Care | Payer: Medicaid Other | Source: Ambulatory Visit | Attending: Otolaryngology | Admitting: Otolaryngology

## 2023-06-10 DIAGNOSIS — R0982 Postnasal drip: Secondary | ICD-10-CM

## 2023-06-10 DIAGNOSIS — R0981 Nasal congestion: Secondary | ICD-10-CM

## 2023-06-10 DIAGNOSIS — J3089 Other allergic rhinitis: Secondary | ICD-10-CM

## 2023-06-10 DIAGNOSIS — J351 Hypertrophy of tonsils: Secondary | ICD-10-CM

## 2023-06-10 DIAGNOSIS — K219 Gastro-esophageal reflux disease without esophagitis: Secondary | ICD-10-CM

## 2023-06-10 DIAGNOSIS — R221 Localized swelling, mass and lump, neck: Secondary | ICD-10-CM | POA: Diagnosis present

## 2023-06-10 DIAGNOSIS — H9202 Otalgia, left ear: Secondary | ICD-10-CM

## 2023-06-10 MED ORDER — IOHEXOL 350 MG/ML SOLN
75.0000 mL | Freq: Once | INTRAVENOUS | Status: AC | PRN
  Administered 2023-06-10: 75 mL via INTRAVENOUS

## 2023-06-15 ENCOUNTER — Telehealth (INDEPENDENT_AMBULATORY_CARE_PROVIDER_SITE_OTHER): Payer: Self-pay | Admitting: Otolaryngology

## 2023-06-15 NOTE — Telephone Encounter (Signed)
 He rescheduled for Thurs morning,  I called to have the CT read as well

## 2023-06-15 NOTE — Telephone Encounter (Signed)
 Patient called and wanted to get results of CT scan as soon as possible. Patient stated that his face is numb on one side as well as swollen neck.

## 2023-06-16 ENCOUNTER — Ambulatory Visit (INDEPENDENT_AMBULATORY_CARE_PROVIDER_SITE_OTHER): Payer: Medicaid Other | Admitting: Otolaryngology

## 2023-06-16 ENCOUNTER — Other Ambulatory Visit: Payer: Self-pay | Admitting: Cardiovascular Disease

## 2023-06-17 ENCOUNTER — Ambulatory Visit (INDEPENDENT_AMBULATORY_CARE_PROVIDER_SITE_OTHER): Payer: Medicaid Other | Admitting: Otolaryngology

## 2023-06-17 ENCOUNTER — Encounter (INDEPENDENT_AMBULATORY_CARE_PROVIDER_SITE_OTHER): Payer: Self-pay | Admitting: Otolaryngology

## 2023-06-17 VITALS — BP 156/96 | HR 85

## 2023-06-17 DIAGNOSIS — J351 Hypertrophy of tonsils: Secondary | ICD-10-CM

## 2023-06-17 DIAGNOSIS — R200 Anesthesia of skin: Secondary | ICD-10-CM

## 2023-06-17 DIAGNOSIS — K118 Other diseases of salivary glands: Secondary | ICD-10-CM

## 2023-06-17 DIAGNOSIS — R221 Localized swelling, mass and lump, neck: Secondary | ICD-10-CM

## 2023-06-17 DIAGNOSIS — R0982 Postnasal drip: Secondary | ICD-10-CM

## 2023-06-17 DIAGNOSIS — K219 Gastro-esophageal reflux disease without esophagitis: Secondary | ICD-10-CM

## 2023-06-17 NOTE — Progress Notes (Signed)
 ENT Progress Note:  Update 06/17/23: Discussed the use of AI scribe software for clinical note transcription with the patient, who gave verbal consent to proceed.  History of Present Illness   The patient is a 53 yoM who is here for f/u regarding a mass at the left angle of the mandible associated with discomfort and numbness. He reports that the discomfort is persistent and has been increasing over time. The patient also notes he can palpate the mass, which he believes has grown in size. He expresses concern about the mass and the associated discomfort, describing it as a constant ache and numbness.  His CT scan did not identify anything. The radiology report indicated a fuller left parotid tail, but no distinct mass was identified. Despite these results, the patient continues to experience discomfort and perceives an increase in the size of the mass.  He denies any history of skin cancer or smoking. The patient's wife, who works at a theme [employer], has suggested a dental workup, which the patient plans to pursue.   Initial Evaluation 05/25/23 Reason for Consult: left neck mass   HPI: Discussed the use of AI scribe software for clinical note transcription with the patient, who gave verbal consent to proceed.  History of Present Illness   The patient is a 53 yoM, with a history of facial spasms managed with Botox  injections, CAD, s/p CABG 2020 f/b Cardiology, hx of dysphagia sx which resolved following EGD and esophageal stretching per patient report, presented with a hard, painful lump near the left angle of the mandible present for about 1 year, with gradual increase in size.   The lump was first noticed while shaving approximately a year ago and has since increased in size, becoming larger over time. The patient reports that the lump has become increasingly irritating and painful, causing discomfort in the throat and ear. Despite the location of the lump, the patient denies any difficulty  swallowing. No trismus or facial weakness/paresthesias.  The patient has a history of salivary gland stones but denies any previous salivary gland infections or treatments. The patient also denies any history of smoking or recent dental infections/odontogenic issues or pain. The patient has noticed another lump on the neck, which has been present for a long time but has not grown significantly and is more superficial close to the left clavicle.   The patient has been under the care of a neurologist for left sided facial spasms. This condition has been managed with Botox  injections every three to six months, which have been effective in controlling the spasms. The patient denies any other head and neck issues, such as thyroid  problems or radiation to the head. No hx of surgery or radiation to the head and neck.    Records Reviewed:  D/c summary 02/15/23 - admitted for nephrolithiasis - outpatient Urology GI f/u recommended   02/17/23 GI office visit  HPI: Danny Brewer. is a 53 y.o. male, who returns for post ER follow-up. He was seen in the [facility] ER on 02/13/23. He had severe periumbilical pain with radiation to the RLQ initial presentation of peritoneal signs. He reports that the pain is a 3-8/10. He reports that Labs and CT scan were unremarkable. He was admitted for observation. Pain felt to be musculoskeletal in nature. Has family history of colon cancer and remote history of colonic polyps. Last EGD/colonoscopy 03/23/2019 without frank findings. 5 year recall suggested due to family history in brother. Due for colonoscopy in 2025. He was  discharged home from ER with prescription for flexeril  and told to follow-up with us . Bowel habits are currently normal. He reports that he is passing a stool daily to every other. He reports that when he passes a stool he feels like pain is slightly improved. He reports no straining to pass the stools. No bloody stools or black colored stools. No diarrhea. He  reports appetite is normal. Reports Weight loss of 9 lbs since April. He is not taking Ozempic. Per his wife he has been going through a particularly tough time emotionally recently. Has been under a lot of stress and had a falling out with some folks that he was particularly close to. Plans to seek the care of a therapist. Has already been in contact, but has been unable to be seen by them.    Cardiology office visit 04/26/23 Danny Brewer is a 53 y.o. male with a hx of:   Coronary artery disease  s/p CABG 10/2018 Echocardiogram 09/19/18: EF 60-65 Intol of beta-blocker  Myoview  03/20/2022: EF 48, normal perfusion, low risk Echo 03/25/2022 EF 60-65, no RWMA, normal RVSF, trivial MR Diabetes mellitus 2 Hypertension  Hyperlipidemia  Intol of statins >> Rx w Alirocumab  (Praluent ) Carotid stenosis  US  10/21/20: Bilateral ICA 1-39 US  03/21/22: no ICA stenosis Bilat ED  Nephrolithiasis (s/p stone extraction in July 2023)    Cardiothoracic surgery office notes 05/10/23 The patient returns for scheduled 6-month follow-up of his neuropathic pain following sternotomy for CABG by Dr. Army in 2020. Previous CT scan of the chest demonstrated no evidence of fibrous malunion in the incision has never been infected. The patient has type 2 diabetes on Jardiance .   Diagnostic Tests: I reviewed the most recent CT scan of the chest showing the sternal closure to be intact.   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  As long as the patient does not lift any objects above his shoulders he is able to carry out the activities of daily living.  He is not on narcotics or tricyclic antidepressants.  He uses Tylenol  as needed.  Patient is disabled from work because of his  Neuropathic pain limiting his ability to lift objects above his shoulders. Plan: Return in 6 months for follow-up and reassessment.    Past Medical History:  Diagnosis Date   BPH (benign  prostatic hyperplasia)    Coronary Artery Disease    cardiologist--- dr wonda;    Coronary CTA 09/2018: pLAD 70-90; pD1+pD2 70-90; mRCA mixed plaque - cannot rule out 70-90; FFR suggests hemodynamically significant stenosis // LHC 10/2018: 2v CAD >> s/p  CABG (L-LAD, L radial-RI, S-D2, S-RCA) with Dr. 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   LIMA--LAD;  SVG--D2;  SVG--dRCA;  left radial --- RI   Type 2 diabetes mellitus (HCC)    followed by pcp   (08-05-2022 checks blood sugar daily am fasting,  fasting sugar-- 77-100)   Wears glasses     Past Surgical History:  Procedure Laterality Date   CORONARY ARTERY BYPASS GRAFT N/A 10/17/2018   Procedure: CORONARY ARTERY BYPASS GRAFTING (CABG), ON PUMP, TIMES  FOUR, USING LIMA TO LAD, ENDOSCOPICALLY HARVESTED GREATER SAPHENOUS VEIN TO DIAG 2, SVG TO DISTAL RCA, AND LEFT RADIAL TO RAMUS INTERMEDIUS;  Surgeon: Army Dallas NOVAK, MD;  Location: [facility] OR;  Service: Open Heart Surgery;  Laterality: N/A;   CYSTOSCOPY WITH RETROGRADE PYELOGRAM, URETEROSCOPY AND STENT PLACEMENT Right 12/30/2021   Procedure: CYSTOSCOPY WITH RETROGRADE PYELOGRAM, URETEROSCOPY AND STENT PLACEMENT, BASKET OF STONES;  Surgeon: Alvaro Hummer, MD;  Location: [facility];  Service: Urology;  Laterality: Right;   CYSTOSCOPY WITH RETROGRADE PYELOGRAM, URETEROSCOPY AND STENT PLACEMENT Left 08/07/2022   Procedure: CYSTOSCOPY WITH RETROGRADE PYELOGRAM, URETEROSCOPY AND STENT PLACEMENT;  Surgeon: Alvaro Hummer, MD;  Location: [facility];  Service: Urology;  Laterality: Left;  1 HR   ENDOVEIN HARVEST OF GREATER SAPHENOUS VEIN Right 10/17/2018   Procedure: Jethro Rubins Of Greater Saphenous Vein;  Surgeon: Army Dallas NOVAK, MD;   Location: [facility] OR;  Service: Open Heart Surgery;  Laterality: Right;   HAND SURGERY Left 1987   complex laceration   HOLMIUM LASER APPLICATION Left 08/07/2022   Procedure: HOLMIUM LASER APPLICATION;  Surgeon: Alvaro Hummer, MD;  Location: [facility];  Service: Urology;  Laterality: Left;   INGUINAL HERNIA REPAIR Bilateral 05/27/2015   Procedure: LAPAROSCOPIC BILATERAL  INGUINAL HERNIA REPAIRS WITH MESH;  Surgeon: Elspeth Schultze, MD;  Location: [facility];  Service: General;  Laterality: Bilateral;   KNEE ARTHROSCOPY Left 1991   LEFT HEART CATH AND CORONARY ANGIOGRAPHY N/A 10/14/2018   Procedure: LEFT HEART CATH AND CORONARY ANGIOGRAPHY;  Surgeon: Claudene Victory ORN, MD;  Location: [facility];  Service: Cardiovascular;  Laterality: N/A;   PENILE PROSTHESIS IMPLANT N/A 10/03/2020   Procedure: PLACEMENT OF PENILE PROTHESIS INFLATABLE;  Surgeon: Matilda Senior, MD;  Location: [facility];  Service: Urology;  Laterality: N/A;  pubis area   RADIAL ARTERY HARVEST Left 10/17/2018   Procedure: RADIAL ARTERY HARVEST;  Surgeon: Army Dallas NOVAK, MD;  Location: [facility] OR;  Service: Open Heart Surgery;  Laterality: Left;   REMOVAL OF PENILE PROSTHESIS N/A 07/07/2021   Procedure: EXPLANT  AND REPLACEMENT OF COLOPLAST TITAN PENILE PROSTHESIS;  Surgeon: Matilda Senior, MD;  Location: [facility];  Service: Urology;  Laterality: N/A;   SCROTAL EXPLORATION N/A 07/07/2021   Procedure: EXPLORATION OF PENILE PROSTHESIS;  Surgeon: Matilda Senior, MD;  Location: [facility];  Service: Urology;  Laterality: N/A;   SHOULDER ARTHROSCOPY Right 07/08/2022   @[facility]  by dr supple;   for impingment   SHOULDER CLOSED REDUCTION Left 09/04/2019   Procedure: CLOSED MANIPULATION SHOULDER;  Surgeon: Rubie Kemps, MD;  Location: [facility];  Service: Orthopedics;  Laterality: Left;   SHOULDER SURGERY Left 2022   TEE WITHOUT CARDIOVERSION N/A 10/17/2018   Procedure: TRANSESOPHAGEAL ECHOCARDIOGRAM (TEE);  Surgeon: Army Dallas NOVAK, MD;  Location: [facility] OR;  Service: Open Heart Surgery;  Laterality: N/A;    Family History  Problem Relation Age of Onset   Hypertension Mother    Cancer Father    Heart failure Father    Sudden death Brother    Heart attack Brother    Hypertension Sister    Heart murmur Sister     Social History:  reports that he has never smoked. He has never used smokeless tobacco. He reports that he does not drink alcohol and does not use drugs.  Allergies:  Allergies  Allergen Reactions   Antihistamines, Diphenhydramine -Type Hives and Other (See Comments)    Seizures   Bactrim  [Sulfamethoxazole -Trimethoprim ] Itching    Throat itching and suspected pill  esophagitis   Demerol  Hives   Morphine  And Codeine Hives    Esp with high dose morphine , possibly Dilaudid  Tolerates hydrocodone , oxycodone    Compazine  [Prochlorperazine ] Other (See Comments)    Spastic movement   Metoprolol      Per patient caused severe headache   Morphine  Other (See Comments)   Oxycodone      Alters Personality   Statins Other (See Comments)    Myalgias on atorvastatin  20-80mg  daily and rosuvastatin  10mg  daily    Medications: I have reviewed the patient's current medications.  The PMH, PSH, Medications, Allergies, and SH were reviewed and updated.  ROS: Constitutional: Negative for fever, weight loss and weight gain. Cardiovascular: Negative for chest pain and dyspnea on exertion. Respiratory: Is not experiencing shortness of breath at rest. Gastrointestinal: Negative for nausea and vomiting. Neurological: Negative for headaches. Psychiatric: The patient is not nervous/anxious  Blood pressure (!) 156/96, pulse 85, SpO2 99%.  PHYSICAL EXAM:  Exam: General: Well-developed, well-nourished Respiratory Respiratory effort: Equal inspiration and expiration without stridor Cardiovascular Peripheral Vascular: Warm extremities with equal color/perfusion Eyes: No nystagmus with equal extraocular motion  bilaterally Neuro/Psych/Balance: Patient oriented to person, place, and time; Appropriate mood and affect; Gait is intact with no imbalance; Cranial nerves I-XII are intact Head and Face Inspection: Normocephalic and atraumatic without mass or lesion Palpation: Facial skeleton intact without bony stepoffs Salivary Glands: No mass or tenderness Facial Strength: Facial motility symmetric and full bilaterally ENT Pinna: External ear intact and fully developed External canal: Canal is patent with intact skin Tympanic Membrane: Clear and mobile External Nose: No scar or anatomic deformity Internal Nose: Septum is relatively straight. No polyp, or purulence. Mucosal edema and erythema present.  Bilateral inferior turbinate hypertrophy.  Lips, Teeth, and gums: Mucosa and teeth intact and viable TMJ: No pain to palpation with full mobility Oral cavity/oropharynx: No erythema or exudate, no lesions present, 1+ tonsils right one slightly larger, but overall symmetric  Neck Neck and Trachea: Midline trachea without mass or lesion Thyroid : No mass or nodularity Lymphatics: No lymphadenopathy, palpable firm mass near left angle of the mandible about 1.5-2 cm on exam no overlying skin changes A small subcutaneous 1 cm nodule left lower neck near clavicle, likely sebaceous cyst vs lipoma   Procedure done on 05/25/23: Preoperative diagnosis: neck mass, hx of dysphagia resolved after EGD and dilation   Postoperative diagnosis:   Same  Procedure: Flexible fiberoptic laryngoscopy  Surgeon: Elena Larry, MD  Anesthesia: Topical lidocaine  and Afrin Complications: None Condition is stable throughout exam  Indications and consent:  The patient presents to the clinic. Indirect laryngoscopy view was incomplete. Thus it was recommended that they undergo a flexible fiberoptic laryngoscopy. All of the risks, benefits, and potential complications were reviewed with the patient preoperatively and verbal  informed consent was obtained.  Procedure: The patient was seated upright in the clinic. Topical lidocaine  and Afrin were applied to the nasal cavity. After adequate anesthesia had occurred, I then proceeded to pass the flexible telescope into the nasal cavity. The nasal cavity was patent without rhinorrhea or polyp. The nasopharynx was also patent without mass or lesion. The base of tongue was visualized and was normal. There were no signs of pooling of secretions in the piriform sinuses. The true vocal folds were mobile bilaterally. There were no signs of glottic or supraglottic mucosal lesion or mass. There was moderate interarytenoid pachydermia and post cricoid edema. The telescope was then slowly withdrawn and the patient tolerated the procedure throughout.  Studies  Reviewed: Soft tissue neck U/S HISTORY: Patient is a 53 y/o M with left submandibular subcutaneous perisistan mass of neck x two months.   COMPARISON: None.   TECHNIQUE: Two-dimensional grayscale and color Doppler ultrasound of the of the left submandibular region of interest was performed.   FINDINGS: There is a 1.6 x 1.2 x 1.5 cm avascular, hypoechoic lesion with increased through transmission identified within the left submandibular, area of concern. No focal fluid collections are demonstrated.   IMPRESSION: 1. Left submandibular avascular, hypoechoic 1.6 cm lesion. This appears cystic. Tissue sampling may be of benefit for further assessment.   CT neck with contrast  FINDINGS: Pharynx and larynx: Normal. No mass or swelling.   Salivary glands: No visible mass or cyst. Negative for calculus or ductal dilatation. Somewhat fuller left parotid tail without discrete mass.   Thyroid : No evidence of mass or inflammation   Lymph nodes: No enlarged or heterogeneous.   Vascular: Negative.   Limited intracranial: Negative.   Visualized orbits: Negative.   Mastoids and visualized paranasal sinuses: Clear.    Skeleton: No acute or aggressive process.   Upper chest: Negative.   IMPRESSION: The patient's sonographic mass is not clearly seen, assuming no interval resection the lesion may be solid and isodense. The left parotid tail is somewhat fuller than the right, is the mass in this location rather than submandibular? Consider further workup using ultrasound guidance.  Assessment/Plan: Encounter Diagnoses  Name Primary?   Mass of left parotid gland Yes   Neck mass    Post-nasal drip    Chronic GERD    Tonsillar hypertrophy    Left facial numbness      Assessment and Plan    Left neck mass Presents with a progressively enlarging and hardening left neck mass at the left angle of the mandible over the past several months. The mass is painful, causing throat and ear discomfort. The mass is likely unrelated to the salivary gland based on its lateral location on exam - superficial mass palpated at the left angle of mandible, and a small subcutaneous lesion likely sebaceous cyst or lipoma at the anterior left neck near clavicle. Differential diagnosis includes lymph node, cyst, benign vs malignant neoplasm. Discussed the need for a CT scan to further evaluate the mass.  - Order CT neck with contrast  - Schedule follow-up appointment after CT neck  Chronic nasal congestion/post-nasal drip and suspect environmental allergies - evidence of mucosal edema and clear secretions but no pus or purulence  - Zyrtec  10 mg daily and Flonase  2 puffs b/l nares BID  Otalgia left side referred pain from left angle of mandible mass  - ear exam unremarkable - will re-assess after CT neck   Facial Spasm Facial spasms due to a blood vessel pressing against a nerve, managed with Botox  injections every 3-6 months. Spasms affect the eye and cheek area. Botox  injections have been effective in managing symptoms. - Continue Botox  injections as scheduled  Follow-up - Schedule CT scan [facility] -  Follow up with ENT after CT scan results.     Update 06/17/23 Assessment and Plan    Left angle of mandible/parotid tail mass palpable on exam ~ 1.5-2 cm, not seen on CT neck but seen on U/S although U/S report mistakenly labeled the area as submandibular. Presents with constant aching and numbness in the left parotid region. Feels the mass has grown in size. Differential diagnosis includes benign lesions, parotid neoplasm vs malignancy, or other soft  tissue abnormalities. MRI warranted for further evaluation. WE discussed that if this lesion is within the parotid gland the majority of parotid lesions are benign. If MRI is negative, biopsy may be considered. MRI preferred over ultrasound due to higher specificity and ability to detect abnormalities not seen on CT. - Order MRI neck w/gad - Documented photos of the mass in Epic under Media tab - Schedule follow-up appointment to discuss MRI results - Consider biopsy if MRI is inconclusive  Dental Evaluation No recent dental work or problems reported. Given the anatomical proximity of symptoms to the left angle of the mandible, a dental evaluation is recommended to rule out dental-related issues and a possibility of reactive node near angle of mandible. Discussed potential contribution of teeth clenching/bruxism to the discomfort along angle of mandible where masseter attachment is located. - Refer to dentist for evaluation  Follow-up - Follow up with dentist - Schedule follow-up appointment after MRI results are available - will consider bx in the future         Thank you for allowing me to participate in the care of this patient. Please do not hesitate to contact me with any questions or concerns.   Elena Larry, MD Otolaryngology [facility] Phone: [phone] Fax: [phone]    06/17/2023, 5:00 PM

## 2023-06-21 ENCOUNTER — Ambulatory Visit: Payer: BC Managed Care – PPO | Admitting: Neurology

## 2023-06-22 ENCOUNTER — Ambulatory Visit (HOSPITAL_COMMUNITY): Admission: RE | Admit: 2023-06-22 | Payer: Medicaid Other | Source: Ambulatory Visit

## 2023-06-30 ENCOUNTER — Other Ambulatory Visit (HOSPITAL_COMMUNITY): Payer: Self-pay

## 2023-06-30 ENCOUNTER — Telehealth: Payer: Self-pay

## 2023-06-30 NOTE — Telephone Encounter (Signed)
Pharmacy Patient Advocate Encounter   Received notification from CoverMyMeds that prior authorization for REPATHA is required/requested.   Insurance verification completed.   The patient is insured through [facility] .   Per test claim: PA required; PA submitted to above mentioned insurance via CoverMyMeds Key/confirmation #/[id number] Status is pending

## 2023-06-30 NOTE — Telephone Encounter (Signed)
Pharmacy Patient Advocate Encounter  Received notification from [facility] that Prior Authorization for REPATHA has been APPROVED from 06/30/23 to 06/29/24. Ran test claim, Copay is $4. This test claim was processed through [facility]- copay amounts may vary at other pharmacies due to pharmacy/plan contracts, or as the patient moves through the different stages of their insurance plan.

## 2023-07-05 ENCOUNTER — Ambulatory Visit (INDEPENDENT_AMBULATORY_CARE_PROVIDER_SITE_OTHER): Payer: Medicaid Other | Admitting: Otolaryngology

## 2023-07-05 ENCOUNTER — Encounter (INDEPENDENT_AMBULATORY_CARE_PROVIDER_SITE_OTHER): Payer: Self-pay | Admitting: Otolaryngology

## 2023-07-05 ENCOUNTER — Telehealth (INDEPENDENT_AMBULATORY_CARE_PROVIDER_SITE_OTHER): Payer: Self-pay | Admitting: Otolaryngology

## 2023-07-05 VITALS — BP 158/95 | HR 112

## 2023-07-05 DIAGNOSIS — R200 Anesthesia of skin: Secondary | ICD-10-CM

## 2023-07-05 DIAGNOSIS — K118 Other diseases of salivary glands: Secondary | ICD-10-CM

## 2023-07-05 DIAGNOSIS — R221 Localized swelling, mass and lump, neck: Secondary | ICD-10-CM | POA: Diagnosis not present

## 2023-07-05 DIAGNOSIS — K219 Gastro-esophageal reflux disease without esophagitis: Secondary | ICD-10-CM

## 2023-07-05 MED ORDER — ACETAMINOPHEN 500 MG PO TABS: 500.0000 mg | ORAL_TABLET | Freq: Four times a day (QID) | ORAL | 1 refills | Status: AC | PRN

## 2023-07-05 MED ORDER — IBUPROFEN 600 MG PO TABS: 600.0000 mg | ORAL_TABLET | Freq: Four times a day (QID) | ORAL | 0 refills | Status: DC

## 2023-07-05 NOTE — Progress Notes (Unsigned)
ENT Progress Note:  Update 06/17/23: Discussed the use of AI scribe software for clinical note transcription with the patient, who gave verbal consent to proceed.  History of Present Illness   The patient is a 53 yoM who is here for f/u regarding a mass at the left angle of the mandible associated with discomfort and numbness. He reports that the discomfort is persistent and has been increasing over time. The patient also notes he can palpate the mass, which he believes has grown in size. He expresses concern about the mass and the associated discomfort, describing it as a constant ache and numbness.  His CT scan did not identify anything. The radiology report indicated a fuller left parotid tail, but no distinct mass was identified. Despite these results, the patient continues to experience discomfort and perceives an increase in the size of the mass.  He denies any history of skin cancer or smoking. The patient's wife, who works at a [employer], has suggested a dental workup, which the patient plans to pursue.   Initial Evaluation 05/25/23 Reason for Consult: left neck mass   HPI: Discussed the use of AI scribe software for clinical note transcription with the patient, who gave verbal consent to proceed.  History of Present Illness   The patient is a 53 yoM, with a history of facial spasms managed with Botox injections, CAD, s/p CABG 2020 f/b Cardiology, hx of dysphagia sx which resolved following EGD and esophageal stretching per patient report, presented with a hard, painful lump near the left angle of the mandible present for about 1 year, with gradual increase in size.   The lump was first noticed while shaving approximately a year ago and has since increased in size, becoming larger over time. The patient reports that the lump has become increasingly irritating and painful, causing discomfort in the throat and ear. Despite the location of the lump, the patient denies any difficulty  swallowing. No trismus or facial weakness/paresthesias.  The patient has a history of salivary gland stones but denies any previous salivary gland infections or treatments. The patient also denies any history of smoking or recent dental infections/odontogenic issues or pain. The patient has noticed another lump on the neck, which has been present for a long time but has not grown significantly and is more superficial close to the left clavicle.   The patient has been under the care of a neurologist for left sided facial spasms. This condition has been managed with Botox injections every three to six months, which have been effective in controlling the spasms. The patient denies any other head and neck issues, such as thyroid problems or radiation to the head. No hx of surgery or radiation to the head and neck.    Records Reviewed:  D/c summary 02/15/23 - admitted for nephrolithiasis - outpatient Urology GI f/u recommended   02/17/23 GI office visit  HPI: Ioan Landini. is a 53 y.o. male, who returns for post ER follow-up. He was seen in the [facility] ER on 02/13/23. He had severe periumbilical pain with radiation to the RLQ initial presentation of peritoneal signs. He reports that the pain is a 3-8/10. He reports that Labs and CT scan were unremarkable. He was admitted for observation. Pain felt to be musculoskeletal in nature. Has family history of colon cancer and remote history of colonic polyps. Last EGD/colonoscopy 03/23/2019 without frank findings. 5 year recall suggested due to family history in brother. Due for colonoscopy in 2025. He was  discharged home from ER with prescription for flexeril and told to follow-up with [place]. Bowel habits are currently normal. He reports that he is passing a stool daily to every other. He reports that when he passes a stool he feels like pain is slightly improved. He reports no straining to pass the stools. No bloody stools or black colored stools. No diarrhea. He  reports appetite is normal. Reports Weight loss of 9 lbs since April. He is not taking Ozempic. Per his wife he has been going through a particularly tough time emotionally recently. Has been under a lot of stress and had a falling out with some folks that he was particularly close to. Plans to seek the care of a therapist. Has already been in contact, but has been unable to be seen by them.    Cardiology office visit 04/26/23 Haniel Fix is a 53 y.o. male with a hx of:   Coronary artery disease  s/p CABG 10/2018 Echocardiogram 09/19/18: EF 60-65 Intol of beta-blocker  Myoview 03/20/2022: EF 48, normal perfusion, low risk Echo 03/25/2022 EF 60-65, no RWMA, normal RVSF, trivial MR Diabetes mellitus 2 Hypertension  Hyperlipidemia  Intol of statins >> Rx w Alirocumab (Praluent) Carotid stenosis  [place] 10/21/20: Bilateral ICA 1-39 [place] 03/21/22: no ICA stenosis Bilat ED  Nephrolithiasis (s/p stone extraction in July 2023)    Cardiothoracic surgery office notes 05/10/23 The patient returns for scheduled 75-month follow-up of his neuropathic pain following sternotomy for CABG by Dr. Tyrone Sage in 2020. Previous CT scan of the chest demonstrated no evidence of fibrous malunion in the incision has never been infected. The patient has type 2 diabetes on Jardiance.   Diagnostic Tests: I reviewed the most recent CT scan of the chest showing the sternal closure to be intact.   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  As long as the patient does not lift any objects above his shoulders he is able to carry out the activities of daily living.  He is not on narcotics or tricyclic antidepressants.  He uses Tylenol as needed.  Patient is disabled from work because of his  Neuropathic pain limiting his ability to lift objects above his shoulders. Plan: Return in 6 months for follow-up and reassessment.    Past Medical History:  Diagnosis Date   BPH (benign  prostatic hyperplasia)    Coronary Artery Disease    cardiologist--- dr Excell Seltzer;    Coronary CTA 09/2018: pLAD 70-90; pD1+pD2 70-90; mRCA mixed plaque - cannot rule out 70-90; FFR suggests hemodynamically significant stenosis // LHC 10/2018: 2v CAD >> s/p  CABG (L-LAD, L radial-RI, S-D2, S-RCA) with Dr. 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   LIMA--LAD;  SVG--D2;  SVG--dRCA;  left radial --- RI   Type 2 diabetes mellitus (HCC)    followed by pcp   (08-05-2022 checks blood sugar daily am fasting,  fasting sugar-- 77-100)   Wears glasses     Past Surgical History:  Procedure Laterality Date   CORONARY ARTERY BYPASS GRAFT N/A 10/17/2018   Procedure: CORONARY ARTERY BYPASS GRAFTING (CABG), ON PUMP, TIMES  FOUR, USING LIMA TO LAD, ENDOSCOPICALLY HARVESTED GREATER SAPHENOUS VEIN TO DIAG 2, SVG TO DISTAL RCA, AND LEFT RADIAL TO RAMUS INTERMEDIUS;  Surgeon: [name], MD;  Location: [facility] OR;  Service: Open Heart Surgery;  Laterality: N/A;   CYSTOSCOPY WITH RETROGRADE PYELOGRAM, URETEROSCOPY AND STENT PLACEMENT Right 12/30/2021   Procedure: CYSTOSCOPY WITH RETROGRADE PYELOGRAM, URETEROSCOPY AND STENT PLACEMENT, BASKET OF STONES;  Surgeon: Sebastian Ache, MD;  Location: [facility];  Service: Urology;  Laterality: Right;   CYSTOSCOPY WITH RETROGRADE PYELOGRAM, URETEROSCOPY AND STENT PLACEMENT Left 08/07/2022   Procedure: CYSTOSCOPY WITH RETROGRADE PYELOGRAM, URETEROSCOPY AND STENT PLACEMENT;  Surgeon: Sebastian Ache, MD;  Location: [facility];  Service: Urology;  Laterality: Left;  1 HR   ENDOVEIN HARVEST OF GREATER SAPHENOUS VEIN Right 10/17/2018   Procedure: Mack Guise Of Greater Saphenous Vein;  Surgeon: [name], MD;   Location: [facility] OR;  Service: Open Heart Surgery;  Laterality: Right;   HAND SURGERY Left 1987   complex laceration   HOLMIUM LASER APPLICATION Left 08/07/2022   Procedure: HOLMIUM LASER APPLICATION;  Surgeon: Sebastian Ache, MD;  Location: [facility];  Service: Urology;  Laterality: Left;   INGUINAL HERNIA REPAIR Bilateral 05/27/2015   Procedure: LAPAROSCOPIC BILATERAL  INGUINAL HERNIA REPAIRS WITH MESH;  Surgeon: Karie Soda, MD;  Location: [facility];  Service: General;  Laterality: Bilateral;   KNEE ARTHROSCOPY Left 1991   LEFT HEART CATH AND CORONARY ANGIOGRAPHY N/A 10/14/2018   Procedure: LEFT HEART CATH AND CORONARY ANGIOGRAPHY;  Surgeon: Lyn Records, MD;  Location: [facility];  Service: Cardiovascular;  Laterality: N/A;   PENILE PROSTHESIS IMPLANT N/A 10/03/2020   Procedure: PLACEMENT OF PENILE PROTHESIS INFLATABLE;  Surgeon: Marcine Matar, MD;  Location: [facility];  Service: Urology;  Laterality: N/A;  pubis area   RADIAL ARTERY HARVEST Left 10/17/2018   Procedure: RADIAL ARTERY HARVEST;  Surgeon: [name], MD;  Location: [facility] OR;  Service: Open Heart Surgery;  Laterality: Left;   REMOVAL OF PENILE PROSTHESIS N/A 07/07/2021   Procedure: EXPLANT  AND REPLACEMENT OF COLOPLAST TITAN PENILE PROSTHESIS;  Surgeon: Marcine Matar, MD;  Location: [facility];  Service: Urology;  Laterality: N/A;   SCROTAL EXPLORATION N/A 07/07/2021   Procedure: EXPLORATION OF PENILE PROSTHESIS;  Surgeon: Marcine Matar, MD;  Location: [facility];  Service: Urology;  Laterality: N/A;   SHOULDER ARTHROSCOPY Right 07/08/2022   @[facility]  by dr supple;   for impingment   SHOULDER CLOSED REDUCTION Left 09/04/2019   Procedure: CLOSED MANIPULATION SHOULDER;  Surgeon: Dannielle Huh, MD;  Location: [facility];  Service: Orthopedics;  Laterality: Left;   SHOULDER SURGERY Left 2022   TEE WITHOUT CARDIOVERSION N/A 10/17/2018   Procedure: TRANSESOPHAGEAL ECHOCARDIOGRAM (TEE);  Surgeon: [name], MD;  Location[facility] OR;  Service: Open Heart Surgery;  Laterality: N/A;    Family History  Problem Relation Age of Onset   Hypertension Mother    Cancer Father    Heart failure Father    Sudden death Brother    Heart attack Brother    Hypertension Sister    Heart murmur Sister     Social History:  reports that he has never smoked. He has never used smokeless tobacco. He reports that he does not drink alcohol and does not use drugs.  Allergies:  Allergies  Allergen Reactions   Antihistamines, Diphenhydramine-Type Hives and Other (See Comments)    Seizures   Bactrim [Sulfamethoxazole-Trimethoprim] Itching    Throat itching and suspected pill  esophagitis   Demerol Hives   Morphine And Codeine Hives    Esp with high dose morphine, possibly Dilaudid Tolerates hydrocodone, oxycodone   Compazine [Prochlorperazine] Other (See Comments)    Spastic movement   Metoprolol     Per patient caused "severe headache"   Morphine Other (See Comments)   Oxycodone     Alters Personality   Statins Other (See Comments)    Myalgias on atorvastatin 20-80mg  daily and rosuvastatin 10mg  daily    Medications: I have reviewed the patient's current medications.  The PMH, PSH, Medications, Allergies, and SH were reviewed and updated.  ROS: Constitutional: Negative for fever, weight loss and weight gain. Cardiovascular: Negative for chest pain and dyspnea on exertion. Respiratory: Is not experiencing shortness of breath at rest. Gastrointestinal: Negative for nausea and vomiting. Neurological: Negative for headaches. Psychiatric: The patient is not nervous/anxious  Blood pressure (!) 158/95, pulse (!) 112, SpO2 96%.  PHYSICAL EXAM:  Exam: General: Well-developed, well-nourished Respiratory Respiratory effort: Equal inspiration and expiration without stridor Cardiovascular Peripheral Vascular: Warm extremities with equal color/perfusion Eyes: No nystagmus with equal extraocular motion  bilaterally Neuro/Psych/Balance: Patient oriented to person, place, and time; Appropriate mood and affect; Gait is intact with no imbalance; Cranial nerves I-XII are intact Head and Face Inspection: Normocephalic and atraumatic without mass or lesion Palpation: Facial skeleton intact without bony stepoffs Salivary Glands: No mass or tenderness Facial Strength: Facial motility symmetric and full bilaterally ENT Pinna: External ear intact and fully developed External canal: Canal is patent with intact skin Tympanic Membrane: Clear and mobile External Nose: No scar or anatomic deformity Internal Nose: Septum is relatively straight. No polyp, or purulence. Mucosal edema and erythema present.  Bilateral inferior turbinate hypertrophy.  Lips, Teeth, and gums: Mucosa and teeth intact and viable TMJ: No pain to palpation with full mobility Oral cavity/oropharynx: No erythema or exudate, no lesions present, 1+ tonsils right one slightly larger, but overall symmetric  Neck Neck and Trachea: Midline trachea without mass or lesion Thyroid: No mass or nodularity Lymphatics: No lymphadenopathy, palpable firm mass near left angle of the mandible about 1.5-2 cm on exam no overlying skin changes A small subcutaneous 1 cm nodule left lower neck near clavicle, likely sebaceous cyst vs lipoma   Procedure done on 05/25/23: Preoperative diagnosis: neck mass, hx of dysphagia resolved after EGD and dilation   Postoperative diagnosis:   Same  Procedure: Flexible fiberoptic laryngoscopy  Surgeon: Ashok Croon, MD  Anesthesia: Topical lidocaine and Afrin Complications: None Condition is stable throughout exam  Indications and consent:  The patient presents to the clinic. Indirect laryngoscopy view was incomplete. Thus it was recommended that they undergo a flexible fiberoptic laryngoscopy. All of the risks, benefits, and potential complications were reviewed with the patient preoperatively and verbal  informed consent was obtained.  Procedure: The patient was seated upright in the clinic. Topical lidocaine and Afrin were applied to the nasal cavity. After adequate anesthesia had occurred, I then proceeded to pass the flexible telescope into the nasal cavity. The nasal cavity was patent without rhinorrhea or polyp. The nasopharynx was also patent without mass or lesion. The base of tongue was visualized and was normal. There were no signs of pooling of secretions in the piriform sinuses. The true vocal folds were mobile bilaterally. There were no signs of glottic or supraglottic mucosal lesion or mass. There was moderate interarytenoid pachydermia and post cricoid edema. The telescope was then slowly withdrawn and the patient tolerated the procedure throughout.  Studies Reviewed: Soft tissue neck U/S HISTORY: Patient is a 53 y/o M with left submandibular subcutaneous peris[place] mass of neck x two months.   COMPARISON: None.   TECHNIQUE: Two-dimensional grayscale and color Doppler ultrasound of the of the left submandibular region of interest was performed.   FINDINGS: There is a 1.6 x 1.2 x 1.5 cm avascular, hypoechoic lesion with increased through transmission identified within the left submandibular, area of concern. No focal fluid collections are demonstrated.   IMPRESSION: 1. Left submandibular avascular, hypoechoic 1.6 cm lesion. This appears cystic. Tissue sampling may be of benefit for further assessment.   CT neck with contrast  FINDINGS: Pharynx and larynx: Normal. No mass or swelling.   Salivary glands: No visible mass or cyst. Negative for calculus or ductal dilatation. Somewhat fuller left parotid tail without discrete mass.   Thyroid: No evidence of mass or inflammation   Lymph nodes: No enlarged or heterogeneous.   Vascular: Negative.   Limited intracranial: Negative.   Visualized orbits: Negative.   Mastoids and visualized paranasal sinuses: Clear.    Skeleton: No acute or aggressive process.   Upper chest: Negative.   IMPRESSION: The patient's sonographic mass is not clearly seen, assuming no interval resection the lesion may be solid and isodense. The left parotid tail is somewhat fuller than the right, is the mass in this location rather than submandibular? Consider further workup using ultrasound guidance.  Assessment/Plan: No diagnosis found.    Assessment and Plan    Left neck mass Presents with a progressively enlarging and hardening left neck mass at the left angle of the mandible over the past several months. The mass is painful, causing throat and ear discomfort. The mass is likely unrelated to the salivary gland based on its lateral location on exam - superficial mass palpated at the left angle of mandible, and a small subcutaneous lesion likely sebaceous cyst or lipoma at the anterior left neck near clavicle. Differential diagnosis includes lymph node, cyst, benign vs malignant neoplasm. Discussed the need for a CT scan to further evaluate the mass.  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 Spasms affect the eye and cheek area. Botox injections have been effective in managing symptoms. - Continue Botox injections as scheduled  Follow-up - Schedule CT scan through [facility] - Follow up with ENT after CT scan results.     Update 06/17/23 Assessment and Plan    Left angle of mandible/parotid tail mass palpable on exam ~ 1.5-2  cm, not seen on CT neck but seen on U/S although U/S report mistakenly labeled the area as "submandibular." Presents with constant aching and numbness in the left parotid region. Feels the mass has grown in size. Differential diagnosis includes benign lesions, parotid neoplasm vs malignancy, or other soft tissue abnormalities. MRI warranted for further evaluation. WE discussed that if this lesion is within the parotid gland the majority of parotid lesions are benign. If MRI is negative, biopsy may be considered. MRI preferred over  ultrasound due to higher specificity and ability to detect abnormalities not seen on CT. - Order MRI neck w/gad - Documented photos of the mass in Epic under "Media" tab - Schedule follow-up appointment to discuss MRI results - Consider biopsy if MRI is inconclusive  Dental Evaluation No recent dental work or problems reported. Given the anatomical proximity of symptoms to the left angle of the mandible, a dental evaluation is recommended to rule out dental-related issues and a possibility of reactive node near angle of mandible. Discussed potential contribution of teeth clenching/bruxism to the discomfort along angle of mandible where masseter attachment is located. - Refer to dentist for evaluation  Follow-up - Follow up with dentist - Schedule follow-up appointment after MRI results are available - will consider bx in the future         Thank you for allowing me to participate in the care of this patient. Please do not hesitate to contact me with any questions or concerns.   Ashok Croon, MD Otolaryngology [facility] Phone: [phone] Fax: [phone]    07/05/2023, 3:35 PM

## 2023-07-05 NOTE — Telephone Encounter (Signed)
Patient's wife called in concerned.  Patient woke up this morning with numbness on the left side of his face and with the feeling that the lump has gotten bigger.  Concerned  Please call [phone] Edenilson Austad or [phone] - wife.

## 2023-07-07 ENCOUNTER — Other Ambulatory Visit (INDEPENDENT_AMBULATORY_CARE_PROVIDER_SITE_OTHER): Payer: Self-pay | Admitting: Otolaryngology

## 2023-07-07 ENCOUNTER — Ambulatory Visit (HOSPITAL_COMMUNITY)
Admission: RE | Admit: 2023-07-07 | Discharge: 2023-07-07 | Disposition: A | Discharge status: Home / Self Care | Payer: Medicaid Other | Source: Ambulatory Visit | Attending: Otolaryngology | Admitting: Otolaryngology

## 2023-07-07 ENCOUNTER — Telehealth (INDEPENDENT_AMBULATORY_CARE_PROVIDER_SITE_OTHER): Payer: Self-pay

## 2023-07-07 DIAGNOSIS — D490 Neoplasm of unspecified behavior of digestive system: Secondary | ICD-10-CM

## 2023-07-07 DIAGNOSIS — K118 Other diseases of salivary glands: Secondary | ICD-10-CM | POA: Insufficient documentation

## 2023-07-07 MED ORDER — GADOBUTROL 1 MMOL/ML IV SOLN
8.0000 mL | Freq: Once | INTRAVENOUS | Status: AC | PRN
  Administered 2023-07-07: 8 mL via INTRAVENOUS

## 2023-07-07 NOTE — Telephone Encounter (Signed)
Spoke to patient and let him know that there was a mass on his MR and that radiology will be contacting him to set up a biopsy.  I told the patient once the biopsy is scheduled to contact our office to schedule a follow up appointment

## 2023-07-07 NOTE — Progress Notes (Signed)
MRI neck with evidence of L parotid mass, will order FNA for further workup and diagnosis.

## 2023-07-07 NOTE — Telephone Encounter (Signed)
-----   Message from [place] sent at 07/07/2023 12:25 PM EST ----- His MRI showed L parotid mass 1.7 cm - I placed an order for FNA to be done (biopsy) - we need to make sure he knows the plan to proceed with needle biopsy and expects a call from radiology. We also need to reschedule his f/u with me for after his biopsy is done. thnx ----- Message ----- From: Interface, Rad Results In Sent: 07/07/2023  11:34 AM EST To: Ashok Croon, MD

## 2023-07-08 NOTE — Progress Notes (Unsigned)
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 Imaging review: Best seen on MR 07/07/23  Decision: Approved Imaging modality to perform: Ultrasound Schedule with: No sedation / Local anesthetic Schedule for: Any VIR  Additional comments:   Please contact me with questions, concerns, or if issue pertaining to this request arise.  Dayne Oley Balm, MD [facility]       Previous Messages    ----- Message ----- From: Alain Marion Sent: 07/07/2023   1:05 PM EST To: Alain Marion; Claudean Kinds; * Subject: [place] FNA BIOPSY SALIVARY GLAND PAROTID GLAND E*  Procedure :[place] FNA BIOPSY SALIVARY GLAND PAROTID GLAND EA ADDT'L LESION  Reason : left parotid tail mass  History : CT ABDOMEN PELVIS W CONTRAST , US SOFT TISSUE HEAD & NECK, DG Chest 2 View ,CT SOFT TISSUE NECK W CONTRAST,MR NECK SOFT TISSUE ONLY W WO CONTRAST  Provider: Ashok Croon, MD  Provider contact : [phone]

## 2023-07-13 ENCOUNTER — Ambulatory Visit (HOSPITAL_COMMUNITY)
Admission: RE | Admit: 2023-07-13 | Discharge: 2023-07-13 | Disposition: A | Discharge status: Home / Self Care | Payer: Medicaid Other | Source: Ambulatory Visit | Attending: Otolaryngology | Admitting: Otolaryngology

## 2023-07-13 VITALS — BP 135/94

## 2023-07-13 DIAGNOSIS — D490 Neoplasm of unspecified behavior of digestive system: Secondary | ICD-10-CM | POA: Diagnosis present

## 2023-07-13 DIAGNOSIS — K118 Other diseases of salivary glands: Secondary | ICD-10-CM

## 2023-07-13 MED ORDER — LIDOCAINE HCL 1 % IJ SOLN
INTRAMUSCULAR | Status: AC
  Filled 2023-07-13: qty 20

## 2023-07-13 NOTE — Procedures (Signed)
 Interventional Radiology Procedure Note  Procedure: US  Guided Biopsy of left parotid mass  Complications: None  Estimated Blood Loss: < 10 mL  Findings: 18 and 25 G FNA biopsy of left parotid gland lesion performed under US  guidance.   Danny Brewer. [place], M.D Pager:  [phone]

## 2023-07-14 LAB — CYTOLOGY - NON PAP

## 2023-07-19 ENCOUNTER — Ambulatory Visit (INDEPENDENT_AMBULATORY_CARE_PROVIDER_SITE_OTHER): Payer: Medicaid Other | Admitting: Otolaryngology

## 2023-07-28 ENCOUNTER — Ambulatory Visit (INDEPENDENT_AMBULATORY_CARE_PROVIDER_SITE_OTHER): Payer: Medicaid Other | Admitting: Otolaryngology

## 2023-07-29 ENCOUNTER — Ambulatory Visit (INDEPENDENT_AMBULATORY_CARE_PROVIDER_SITE_OTHER): Payer: Medicaid Other | Admitting: Otolaryngology

## 2023-07-29 ENCOUNTER — Encounter (INDEPENDENT_AMBULATORY_CARE_PROVIDER_SITE_OTHER): Payer: Self-pay | Admitting: Otolaryngology

## 2023-07-29 ENCOUNTER — Telehealth: Payer: Self-pay | Admitting: *Deleted

## 2023-07-29 VITALS — BP 132/77 | HR 105

## 2023-07-29 DIAGNOSIS — R6884 Jaw pain: Secondary | ICD-10-CM

## 2023-07-29 DIAGNOSIS — D110 Benign neoplasm of parotid gland: Secondary | ICD-10-CM | POA: Diagnosis not present

## 2023-07-29 DIAGNOSIS — K118 Other diseases of salivary glands: Secondary | ICD-10-CM

## 2023-07-29 NOTE — Progress Notes (Signed)
ENT Progress Note:   Update 07/29/2023  Discussed the use of AI scribe software for clinical note transcription with the patient, who gave verbal consent to proceed.  History of Present Illness   Danny Brewer is a 53 year old male with hx of left neck mass near the angle of the mandible, presents with persistent pain and numbness.   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 The mass is located lower in the parotid tail. There is numbness on the affected side of the face and some pain. No issues with facial movement or opening the mouth wide.  He has not yet consulted an oral surgeon/dentist but is considering consulting an oral surgeon to rule out any dental causes as a source of his pain. He has discussed the possibility of TMJ involvement and the use of a mouthpiece with his dentist.  He has a history of open-heart surgery in 2020 for a quadruple bypass. He follows up with a cardiologist, last visit 2 mo ago.     Records Reviewed:   Update 07/05/23   Discussed the use of AI scribe software for clinical note transcription with the patient, who gave verbal consent to proceed.  History of Present Illness   The patient returns for following up. He reports new symptom of facial pain, described as a radiating sensation from the jaw to the nose across his cheek, feeling of being "hit in the face" and numbness. The pain is accompanied by a numbing sensation and is not reported to disrupt sleep. The patient also reports a mass in the jaw area that seems to have increased in size. There is no reported difficulty in opening the mouth wide or moving facial muscles. He is scheduled for MRI neck to better delineate the mass, since his prior CT neck did not show discrete masses in the area of concern.   The patient denies any history of teeth grinding or clenching at night, but reports snoring as per his spouse's  observation. The patient also denies any history of skin cancers or any other cancers. The patient has an upcoming MRI scheduled to further investigate the cause of the symptoms and the nature of the reported mass (1/29)      Update 06/17/23: Discussed the use of AI scribe software for clinical note transcription with the patient, who gave verbal consent to proceed.  History of Present Illness   The patient is a 71 yoM who is here for f/u regarding a mass at the left angle of the mandible associated with discomfort and numbness. He reports that the discomfort is persistent and has been increasing over time. The patient also notes he can palpate the mass, which he believes has grown in size. He expresses concern about the mass and the associated discomfort, describing it as a constant ache and numbness.  His CT scan did not identify anything. The radiology report indicated a fuller left parotid tail, but no distinct mass was identified. Despite these results, the patient continues to experience discomfort and perceives an increase in the size of the mass.  He denies any history of skin cancer or smoking. The patient's wife, who works at a [employer], has suggested a dental workup, which the patient plans to pursue.   Initial Evaluation 05/25/23 Reason for Consult: left neck mass   HPI: Discussed the use of AI scribe software for clinical note transcription with  the patient, who gave verbal consent to proceed.  History of Present Illness   The patient is a 89 yoM, with a history of facial spasms managed with Botox injections, CAD, s/p CABG 2020 f/b Cardiology, hx of dysphagia sx which resolved following EGD and esophageal stretching per patient report, presented with a hard, painful lump near the left angle of the mandible present for about 1 year, with gradual increase in size.   The lump was first noticed while shaving approximately a year ago and has since increased in size, becoming larger  over time. The patient reports that the lump has become increasingly irritating and painful, causing discomfort in the throat and ear. Despite the location of the lump, the patient denies any difficulty swallowing. No trismus or facial weakness/paresthesias.  The patient has a history of salivary gland stones but denies any previous salivary gland infections or treatments. The patient also denies any history of smoking or recent dental infections/odontogenic issues or pain. The patient has noticed another lump on the neck, which has been present for a long time but has not grown significantly and is more superficial close to the left clavicle.   The patient has been under the care of a neurologist for left sided facial spasms. This condition has been managed with Botox injections every three to six months, which have been effective in controlling the spasms. The patient denies any other head and neck issues, such as thyroid problems or radiation to the head. No hx of surgery or radiation to the head and neck.    Cardiology office visit 04/26/23 Danny Brewer is a 53 y.o. male with a hx of:   Coronary artery disease  s/p CABG 10/2018 Echocardiogram 09/19/18: EF 60-65 Intol of beta-blocker  Myoview 03/20/2022: EF 48, normal perfusion, low risk Echo 03/25/2022 EF 60-65, no RWMA, normal RVSF, trivial MR Diabetes mellitus 2 Hypertension  Hyperlipidemia  Intol of statins >> Rx w Alirocumab (Praluent) Carotid stenosis  [place] 10/21/20: Bilateral ICA 1-39 [place] 03/21/22: no ICA stenosis Bilat ED  Nephrolithiasis (s/p stone extraction in July 2023)    Cardiothoracic surgery office notes 05/10/23 The patient returns for scheduled 36-month follow-up of his neuropathic pain following sternotomy for CABG by Dr. Tyrone Brewer in 2020. Previous CT scan of the chest demonstrated no evidence of fibrous malunion in the incision has never been infected. The patient has type 2 diabetes on Jardiance.   Diagnostic Tests: I  reviewed the most recent CT scan of the chest showing the sternal closure to be intact.   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  As long as the patient does not lift any objects above his shoulders he is able to carry out the activities of daily living.  He is not on narcotics or tricyclic antidepressants.  He uses Tylenol as needed.  Patient is disabled from work because of his  Neuropathic pain limiting his ability to lift objects above his shoulders. Plan: Return in 6 months for follow-up and reassessment.    Past Medical History:  Diagnosis Date   BPH (benign prostatic hyperplasia)    Coronary Artery Disease    cardiologist--- dr Excell Seltzer;    Coronary CTA 09/2018: pLAD 70-90; pD1+pD2 70-90; mRCA mixed plaque - cannot rule out 70-90; FFR suggests hemodynamically significant stenosis // LHC 10/2018: 2v CAD >> s/p  CABG (L-LAD, L radial-RI, S-D2, S-RCA) with Dr. Tyrone Brewer // Echo 10/23: EF 60-65, no RWMA, normal RVSF, trivial MR;   NUC 03-20-2022 normal  ef 48%   ED (erectile dysfunction)    GERD    watches diet   History of kidney stones    Hypertension    Mixed hyperlipidemia    OA (osteoarthritis)    neck , back   Renal calculus, left    S/P CABG x 4 10/17/2018   LIMA--LAD;  SVG--D2;  SVG--dRCA;  left radial --- RI   Type 2 diabetes mellitus (HCC)    followed by pcp   (08-05-2022 checks blood sugar daily am fasting,  fasting sugar-- 77-100)   Wears glasses     Past Surgical History:  Procedure Laterality Date   CORONARY ARTERY BYPASS GRAFT N/A 10/17/2018   Procedure: CORONARY ARTERY BYPASS GRAFTING (CABG), ON PUMP, TIMES FOUR, USING LIMA TO LAD, ENDOSCOPICALLY HARVESTED GREATER SAPHENOUS VEIN TO DIAG 2, SVG TO DISTAL RCA, AND LEFT RADIAL TO RAMUS INTERMEDIUS;  Surgeon: [name], MD;  Location: [facility] OR;  Service: Open Heart Surgery;  Laterality: N/A;   CYSTOSCOPY WITH RETROGRADE PYELOGRAM, URETEROSCOPY AND STENT PLACEMENT Right  12/30/2021   Procedure: CYSTOSCOPY WITH RETROGRADE PYELOGRAM, URETEROSCOPY AND STENT PLACEMENT, BASKET OF STONES;  Surgeon: Sebastian Ache, MD;  Location: [facility];  Service: Urology;  Laterality: Right;   CYSTOSCOPY WITH RETROGRADE PYELOGRAM, URETEROSCOPY AND STENT PLACEMENT Left 08/07/2022   Procedure: CYSTOSCOPY WITH RETROGRADE PYELOGRAM, URETEROSCOPY AND STENT PLACEMENT;  Surgeon: Sebastian Ache, MD;  Location: [facility];  Service: Urology;  Laterality: Left;  1 HR   ENDOVEIN HARVEST OF GREATER SAPHENOUS VEIN Right 10/17/2018   Procedure: Mack Guise Of Greater Saphenous Vein;  Surgeon: [name], MD;  Location: [facility] OR;  Service: Open Heart Surgery;  Laterality: Right;   HAND SURGERY Left 1987   complex laceration   HOLMIUM LASER APPLICATION Left 08/07/2022   Procedure: HOLMIUM LASER APPLICATION;  Surgeon: Sebastian Ache, MD;  Location: [facility];  Service: Urology;  Laterality: Left;   INGUINAL HERNIA REPAIR Bilateral 05/27/2015   Procedure: LAPAROSCOPIC BILATERAL  INGUINAL HERNIA REPAIRS WITH MESH;  Surgeon: Karie Soda, MD;  Location: [facility];  Service: General;  Laterality: Bilateral;   KNEE ARTHROSCOPY Left 1991   LEFT HEART CATH AND CORONARY ANGIOGRAPHY N/A 10/14/2018   Procedure: LEFT HEART CATH AND CORONARY ANGIOGRAPHY;  Surgeon: Lyn Records, MD;  Location: [facility];  Service: Cardiovascular;  Laterality: N/A;   PENILE PROSTHESIS IMPLANT N/A 10/03/2020   Procedure: PLACEMENT OF PENILE PROTHESIS INFLATABLE;  Surgeon: Marcine Matar, MD;  Location: [facility];  Service: Urology;  Laterality: N/A;  pubis area   RADIAL ARTERY HARVEST Left 10/17/2018   Procedure: RADIAL ARTERY HARVEST;  Surgeon: [name], MD;  Location: [facility] OR;  Service: Open Heart Surgery;  Laterality: Left;   REMOVAL OF PENILE PROSTHESIS N/A 07/07/2021   Procedure: EXPLANT  AND REPLACEMENT OF COLOPLAST TITAN PENILE PROSTHESIS;  Surgeon: Marcine Matar, MD;  Location: [facility];  Service: Urology;  Laterality: N/A;   SCROTAL EXPLORATION N/A 07/07/2021   Procedure: EXPLORATION OF PENILE PROSTHESIS;  Surgeon: Marcine Matar, MD;  Location: [facility];  Service: Urology;  Laterality: N/A;   SHOULDER ARTHROSCOPY Right 07/08/2022   @[facility]  by dr supple;   for impingment   SHOULDER CLOSED REDUCTION Left 09/04/2019   Procedure: CLOSED MANIPULATION SHOULDER;  Surgeon: Dannielle Huh, MD;  Location: [facility];  Service: Orthopedics;  Laterality: Left;   SHOULDER SURGERY Left 2022   TEE WITHOUT CARDIOVERSION N/A 10/17/2018   Procedure: TRANSESOPHAGEAL ECHOCARDIOGRAM (TEE);  Surgeon: [name],  MD;  Location: [facility] OR;  Service: Open Heart Surgery;  Laterality: N/A;    Family History  Problem Relation Age of Onset   Hypertension Mother    Cancer Father    Heart failure Father    Sudden death Brother    Heart attack Brother    Hypertension Sister    Heart murmur Sister     Social History:  reports that he has never smoked. He has never used smokeless tobacco. He reports that he does not drink alcohol and does not use drugs.  Allergies:  Allergies  Allergen Reactions   Antihistamines, Diphenhydramine-Type Hives and Other (See Comments)    Seizures   Bactrim [Sulfamethoxazole-Trimethoprim] Itching    Throat itching and suspected pill esophagitis   Demerol Hives   Morphine And Codeine Hives    Esp with high dose morphine, possibly Dilaudid Tolerates hydrocodone, oxycodone   Compazine [Prochlorperazine] Other (See Comments)    Spastic movement   Metoprolol     Per patient caused "severe headache"   Morphine Other (See Comments)   Oxycodone     Alters Personality   Statins Other (See Comments)    Myalgias on atorvastatin 20-80mg  daily and rosuvastatin 10mg  daily    Medications: I have reviewed the patient's current medications.  The PMH, PSH, Medications, Allergies, and SH were reviewed and updated.  ROS: Constitutional: Negative for fever, weight loss  and weight gain. Cardiovascular: Negative for chest pain and dyspnea on exertion. Respiratory: Is not experiencing shortness of breath at rest. Gastrointestinal: Negative for nausea and vomiting. Neurological: Negative for headaches. Psychiatric: The patient is not nervous/anxious  Blood pressure 132/77, pulse (!) 105, SpO2 98%.  PHYSICAL EXAM:  Exam: General: Well-developed, well-nourished Respiratory Respiratory effort: Equal inspiration and expiration without stridor Cardiovascular Peripheral Vascular: Warm extremities with equal color/perfusion Eyes: No nystagmus with equal extraocular motion bilaterally Neuro/Psych/Balance: Patient oriented to person, place, and time; Appropriate mood and affect; Gait is intact with no imbalance; Cranial nerves I-XII are intact Head and Face Inspection: Normocephalic and atraumatic without mass or lesion Palpation: Facial skeleton intact without bony stepoffs Salivary Glands: No mass or tenderness Facial Strength: Facial motility symmetric and full bilaterally ENT Pinna: External ear intact and fully developed External canal: Canal is patent with intact skin Tympanic Membrane: Clear and mobile External Nose: No scar or anatomic deformity Internal Nose: Septum is relatively straight. No polyp, or purulence. Mucosal edema and erythema present.  Bilateral inferior turbinate hypertrophy.  Lips, Teeth, and gums: Mucosa and teeth intact and viable TMJ: Mild pain to palpation L > R with full mobility Oral cavity/oropharynx: No erythema or exudate, no lesions present, 1+ tonsils right one slightly larger, but overall symmetric  Neck Neck and Trachea: Midline trachea without mass or lesion Thyroid: No mass or nodularity Lymphatics: No lymphadenopathy, previously noted palpable firm mass near left angle of the mandible about 1.5-2 cm on exam no overlying skin changes    CT neck with contrast 06/10/23 IMPRESSION: The patient's sonographic mass is  not clearly seen, assuming no interval resection the lesion may be solid and isodense. The left parotid tail is somewhat fuller than the right, is the mass in this location rather than submandibular? Consider further workup using ultrasound guidance.  MRI neck w/con 07/07/23 IMPRESSION: 1. 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 2. No cervical lymphadenopathy.  FNA result left parotid mass 07/13/23 FINAL MICROSCOPIC DIAGNOSIS:  - Pleomorphic adenoma    Assessment/Plan: Encounter Diagnoses  Name Primary?   Mass of left parotid gland Yes   Parotid pleomorphic adenoma    Jaw pain       Assessment and Plan    Left neck mass Presents with a progressively enlarging and hardening left neck mass at the left angle of the mandible over the past several months. The mass is painful, causing throat and ear discomfort. The mass is likely unrelated to the salivary gland based on its lateral location on exam - superficial mass palpated at the left angle of mandible, and a small subcutaneous lesion likely sebaceous cyst or lipoma at the anterior left neck near clavicle. Differential diagnosis includes lymph node, cyst, benign vs malignant neoplasm. Discussed the need for a CT scan to further evaluate the mass.  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 Spasms affect the eye and cheek area. Botox injections have been effective in managing symptoms. - Continue Botox  injections as scheduled  Follow-up - Schedule CT scan through [facility] - Follow up with ENT after CT scan results.     Update 06/17/23 Assessment and Plan    Left angle of mandible/parotid tail mass palpable on exam ~ 1.5-2 cm, not seen on CT neck but seen on U/S although U/S report mistakenly labeled the area as "submandibular." Presents with constant aching and numbness in the left parotid region. Feels the mass has grown in size. Differential diagnosis includes benign lesions, parotid neoplasm vs malignancy, or other soft tissue abnormalities. MRI warranted for further evaluation. WE discussed that if this lesion is within the parotid gland the majority of parotid lesions are benign. If MRI is negative, biopsy may be considered. MRI preferred over ultrasound due to higher specificity and ability to detect abnormalities not seen on CT. - Order MRI neck w/gad - Documented photos of the mass in Epic under "Media" tab - Schedule follow-up appointment to discuss MRI results - Consider biopsy if MRI is inconclusive  Dental Evaluation No recent dental work or problems reported. Given the anatomical proximity of symptoms to the left angle of the mandible, a dental evaluation is recommended to rule out dental-related issues and a possibility of reactive node near angle of mandible. Discussed potential contribution of teeth clenching/bruxism to the discomfort along angle of mandible where masseter attachment is located. - Refer to dentist for evaluation  Follow-up - Follow up with dentist - Schedule follow-up appointment after MRI results are available - will consider bx in the future      Update 07/06/23  He returns for follow-up to discuss persistent symptoms of numbness and radiating pain along the left side of his face and jaw.  He feels that the mass at the left angle of the mandible appears to be a bit bigger.  No other new symptoms noted.  Denies trismus denies facial muscle  weakness denies overlying skin changes.  Denies difficulties with swallowing or chewing.  Denies history of TMJ syndrome but has mild subluxation and tenderness on exam with wide  mouth opening left greater than right.  Has palpable mass at the left angle of the mandible approximately 2 cm in size without overlying skin changes.  Cranial nerve VII intact.  Denies history of skin cancer or any other cancers in the past.   -We discussed that the management of his symptoms remains the same and we need to wait until MRI neck with contrast results are available to determine the best course of action including core biopsy versus excisional biopsy.  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 The mass is small and located in the tail of the gland, favorable for surgical removal with minimal dissection. Significant pain and numbness reported, atypical for pleomorphic adenoma, suggesting a possibility of another underlying issue or different pathology in the parotid gland. Primary risk is facial nerve damage, but tumor location reduces this risk. Discussed potential postoperative complications: discomfort, tingling, sweating when eating, and fluid collection (seroma/hematoma). Informed consent obtained after discussing risks, benefits, and alternatives. Explained pain may not be related to the tumor and may persist postoperatively. Also explained that final diagnosis will be confirmed with final pathology after surgery. - Schedule for left superficial parotidectomy - Perform nerve monitoring during surgery - Place a drain postoperatively and remove it the next day - Postoperative follow-up to assess recovery and pathology results  Pain and Numbness in the Parotid Region Significant pain and numbness  in the parotid region, inconsistent with pleomorphic adenoma, and this could be 2/2 an alternative diagnosis of malignant neoplasm of the parotid vs another underlying issue. Differential diagnosis includes dental issues or temporomandibular joint (TMJ) disorder. Plans to consult an oral surgeon and a dentist to rule out dental causes. - Consult with an oral surgeon - Consult with a dentist for TMJ evaluation and possible mouthpiece fitting  Coronary Artery Disease with Quadruple Bypass Surgery Coronary artery disease with quadruple bypass surgery in 2020. Currently not on aspirin in preparation for the procedure. Followed by cardiologist Dr. Excell Seltzer. - Fax preoperative clearance form to Cardiology - Resume aspirin as it is not contraindicated for the procedure  Follow-up - Schedule surgery  - Ensure postoperative follow-up appointment is arranged.   I spent 30 minutes in total face-to-face time and in reviewing records during pre-charting, more than 50% of which was spent in counseling and coordination of care, reviewing test results, reviewing medications and treatment regimen and/or in discussing or reviewing the diagnosis, the prognosis and treatment options. Pertinent laboratory and imaging test results that were available during this visit with the patient were reviewed by me and considered in my medical decision making (see chart for details).       Ashok Croon, MD Otolaryngology [facility] Phone: [phone] Fax: [phone]    07/29/2023, 5:56 PM

## 2023-07-29 NOTE — Telephone Encounter (Signed)
   Pre-operative Risk Assessment    Patient Name: Danny Brewer  DOB: [birth year] MRN: [id number]   Date of last office visit: 04/26/23 DR. Excell Seltzer Date of next office visit: NONE   Request for Surgical Clearance    Procedure:   SUPERFICIAL MASS PAROTID LEFT SIDE  Date of Surgery:  Clearance TBD                                Surgeon:  DR. Irene Pap Surgeon's Group or Practice Name:  [facility]  Phone number:  [phone] Fax number:  [phone]   Type of Clearance Requested:   - Medical  - Pharmacy:  Hold Aspirin     Type of Anesthesia:  General    Additional requests/questions:    Elpidio Anis   07/29/2023, 6:33 PM

## 2023-07-30 ENCOUNTER — Telehealth: Payer: Self-pay

## 2023-07-30 NOTE — Telephone Encounter (Signed)
 Patient has been scheduled for telephone visit.

## 2023-07-30 NOTE — Telephone Encounter (Signed)
Patient has been scheduled for telephone visit and consent done     Patient Consent for Virtual Visit         Danny Brewer has provided verbal consent on 07/30/2023 for a virtual visit (video or telephone).   CONSENT FOR VIRTUAL VISIT FOR:  Danny Brewer  By participating in this virtual visit I agree to the following:  I hereby voluntarily request, consent and authorize [facility] and its employed or contracted physicians, physician assistants, nurse practitioners or other licensed health care professionals (the Practitioner), to provide me with telemedicine health care services (the "Services") as deemed necessary by the treating Practitioner. I acknowledge and consent to receive the Services by the Practitioner via telemedicine. I understand that the telemedicine visit will involve communicating with the Practitioner through live audiovisual communication technology and the disclosure of certain medical information by electronic transmission. I acknowledge that I have been given the opportunity to request an in-person assessment or other available alternative prior to the telemedicine visit and am voluntarily participating in the telemedicine visit.  I understand that I have the right to withhold or withdraw my consent to the use of telemedicine in the course of my care at any time, without affecting my right to future care or treatment, and that the Practitioner or I may terminate the telemedicine visit at any time. I understand that I have the right to inspect all information obtained and/or recorded in the course of the telemedicine visit and may receive copies of available information for a reasonable fee.  I understand that some of the potential risks of receiving the Services via telemedicine include:  Delay or interruption in medical evaluation due to technological equipment failure or disruption; Information transmitted may not be sufficient (e.g. poor resolution of images) to  allow for appropriate medical decision making by the Practitioner; and/or  In rare instances, security protocols could fail, causing a breach of personal health information.  Furthermore, I acknowledge that it is my responsibility to provide information about my medical history, conditions and care that is complete and accurate to the best of my ability. I acknowledge that Practitioner's advice, recommendations, and/or decision may be based on factors not within their control, such as incomplete or inaccurate data provided by me or distortions of diagnostic images or specimens that may result from electronic transmissions. I understand that the practice of medicine is not an exact science and that Practitioner makes no warranties or guarantees regarding treatment outcomes. I acknowledge that a copy of this consent can be made available to me via my patient portal [facility] MyChart), or I can request a printed copy by calling the office of [facility].    I understand that my insurance will be billed for this visit.   I have read or had this consent read to me. I understand the contents of this consent, which adequately explains the benefits and risks of the Services being provided via telemedicine.  I have been provided ample opportunity to ask questions regarding this consent and the Services and have had my questions answered to my satisfaction. I give my informed consent for the services to be provided through the use of telemedicine in my medical care

## 2023-07-30 NOTE — Telephone Encounter (Signed)
   Name: Zylen Wenig  DOB: 1971-06-05  MRN: [id number]  Primary Cardiologist: Tonny Bollman, MD  Chart reviewed as part of pre-operative protocol coverage. Because of Lealand Bruss's past medical history and time since last visit, he will require a follow-up telephone visit in order to better assess preoperative cardiovascular risk.  Pre-op covering staff: - Please schedule appointment and call patient to inform them. If patient already had an upcoming appointment within acceptable timeframe, please add "pre-op clearance" to the appointment notes so provider is aware. - Please contact requesting surgeon's office via preferred method (i.e, phone, fax) to inform them of need for appointment prior to surgery.  Patient with remote history of CABG back in 2020.  If asymptomatic from a cardiac standpoint at the time of phone visit, can hold aspirin x 5 to 7 days prior to procedure.  Please resume when medically safe to do so.  Patient lives in [place].  Sharlene Dory, PA-C  07/30/2023, 8:13 AM

## 2023-08-11 ENCOUNTER — Ambulatory Visit: Payer: Medicaid Other | Attending: Nurse Practitioner

## 2023-08-11 DIAGNOSIS — Z01810 Encounter for preprocedural cardiovascular examination: Secondary | ICD-10-CM | POA: Diagnosis not present

## 2023-08-11 NOTE — Progress Notes (Signed)
 Virtual Visit via Telephone Note   Because of Danny Brewer co-morbid illnesses, he is at least at moderate risk for complications without adequate follow up.  This format is felt to be most appropriate for this patient at this time.  Due to technical limitations with video connection (technology), today's appointment will be conducted as an audio only telehealth visit, and Zebulun Deman verbally agreed to proceed in this manner.   All issues noted in this document were discussed and addressed.  No physical exam could be performed with this format.  Evaluation Performed:  Preoperative cardiovascular risk assessment _____________   Date:  08/11/2023   Patient ID:  Danny Brewer, DOB [birth year], MRN [id number] Patient Location:  Home Provider location:   Office  Primary Care Provider:  Jackelyn Poling, DO Primary Cardiologist:  Tonny Bollman, MD  Chief Complaint / Patient Profile   53 y.o. 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  History of Present Illness    Danny Brewer is a 53 y.o. male who presents via audio/video conferencing for a telehealth visit today.  Pt was last seen in cardiology clinic on 04/26/2023 by Dr. Excell Seltzer.  At that time Kenyada Hy was doing well but reported episode of palpitations and arm discomfort who was seen for evaluation.  The decision was made to complete watchful waiting with no cardiac workup at that time per shared decision.  He was seen in the ED on 05/28/2023 with complaint of chest pain that he described as exertional and located in the middle of the chest.  He had workup with no new acute changes and case was discussed with attending cardiology team and decision made to follow-up with cardiology and to return for any acute changes.  The patient is now pending procedure as outlined  above. Since his last visit, he has been doing well and states that his chest pain is musculoskeletal and related to his previous CABG incision.  He reports relief with as needed Tylenol and he also reports no recurrence of palpitations.  He denies chest pain, shortness of breath, lower extremity edema, fatigue, palpitations, melena, hematuria, hemoptysis, diaphoresis, weakness, presyncope, syncope, orthopnea, and PND.   Past Medical History    Past Medical History:  Diagnosis Date   BPH (benign prostatic hyperplasia)    Coronary Artery Disease    cardiologist--- dr Excell Seltzer;    Coronary CTA 09/2018: pLAD 70-90; pD1+pD2 70-90; mRCA mixed plaque - cannot rule out 70-90; FFR suggests hemodynamically significant stenosis // LHC 10/2018: 2v CAD >> s/p  CABG (L-LAD, L radial-RI, S-D2, S-RCA) with Dr. 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   LIMA--LAD;  SVG--D2;  SVG--dRCA;  left radial --- RI   Type 2 diabetes mellitus (HCC)    followed by pcp   (08-05-2022 checks blood sugar daily am fasting,  fasting sugar-- 77-100)   Wears glasses    Past Surgical History:  Procedure Laterality Date   CORONARY ARTERY BYPASS GRAFT  N/A 10/17/2018   Procedure: CORONARY ARTERY BYPASS GRAFTING (CABG), ON PUMP, TIMES FOUR, USING LIMA TO LAD, ENDOSCOPICALLY HARVESTED GREATER SAPHENOUS VEIN TO DIAG 2, SVG TO DISTAL RCA, AND LEFT RADIAL TO RAMUS INTERMEDIUS;  Surgeon: [name], MD;  Location: [facility] OR;  Service: Open Heart Surgery;  Laterality: N/A;   CYSTOSCOPY WITH RETROGRADE PYELOGRAM, URETEROSCOPY AND STENT PLACEMENT Right 12/30/2021   Procedure: CYSTOSCOPY WITH RETROGRADE PYELOGRAM, URETEROSCOPY AND STENT PLACEMENT, BASKET OF STONES;  Surgeon:  Sebastian Ache, MD;  Location: [facility] ORS;  Service: Urology;  Laterality: Right;   CYSTOSCOPY WITH RETROGRADE PYELOGRAM, URETEROSCOPY AND STENT PLACEMENT Left 08/07/2022   Procedure: CYSTOSCOPY WITH RETROGRADE PYELOGRAM, URETEROSCOPY AND STENT PLACEMENT;  Surgeon: Sebastian Ache, MD;  Location: [facility];  Service: Urology;  Laterality: Left;  1 HR   ENDOVEIN HARVEST OF GREATER SAPHENOUS VEIN Right 10/17/2018   Procedure: Mack Guise Of Greater Saphenous Vein;  Surgeon: [name], MD;  Location: [facility] OR;  Service: Open Heart Surgery;  Laterality: Right;   HAND SURGERY Left 1987   complex laceration   HOLMIUM LASER APPLICATION Left 08/07/2022   Procedure: HOLMIUM LASER APPLICATION;  Surgeon: Sebastian Ache, MD;  Location: [facility];  Service: Urology;  Laterality: Left;   INGUINAL HERNIA REPAIR Bilateral 05/27/2015   Procedure: LAPAROSCOPIC BILATERAL  INGUINAL HERNIA REPAIRS WITH MESH;  Surgeon: Karie Soda, MD;  Location: [facility];  Service: General;  Laterality: Bilateral;   KNEE ARTHROSCOPY Left 1991   LEFT HEART CATH AND CORONARY ANGIOGRAPHY N/A 10/14/2018   Procedure: LEFT HEART CATH AND CORONARY ANGIOGRAPHY;  Surgeon: Lyn Records, MD;  Location: [facility];  Service: Cardiovascular;  Laterality: N/A;   PENILE PROSTHESIS IMPLANT N/A 10/03/2020   Procedure: PLACEMENT OF PENILE PROTHESIS INFLATABLE;  Surgeon: Marcine Matar, MD;  Location: [facility];  Service: Urology;  Laterality: N/A;  pubis area   RADIAL ARTERY HARVEST Left 10/17/2018   Procedure: RADIAL ARTERY HARVEST;  Surgeon: [name], MD;  Location: [facility] OR;  Service: Open Heart Surgery;  Laterality: Left;   REMOVAL OF PENILE PROSTHESIS N/A 07/07/2021   Procedure: EXPLANT  AND REPLACEMENT OF COLOPLAST TITAN PENILE PROSTHESIS;  Surgeon: Marcine Matar, MD;  Location: [facility];  Service: Urology;  Laterality: N/A;   SCROTAL EXPLORATION N/A 07/07/2021   Procedure: EXPLORATION OF PENILE  PROSTHESIS;  Surgeon: Marcine Matar, MD;  Location: [facility];  Service: Urology;  Laterality: N/A;   SHOULDER ARTHROSCOPY Right 07/08/2022   @[facility]  by dr supple;   for impingment   SHOULDER CLOSED REDUCTION Left 09/04/2019   Procedure: CLOSED MANIPULATION SHOULDER;  Surgeon: Dannielle Huh, MD;  Location: [facility];  Service: Orthopedics;  Laterality: Left;   SHOULDER SURGERY Left 2022   TEE WITHOUT CARDIOVERSION N/A 10/17/2018   Procedure: TRANSESOPHAGEAL ECHOCARDIOGRAM (TEE);  Surgeon: [name], MD;  Location: [facility] OR;  Service: Open Heart Surgery;  Laterality: N/A;    Allergies  Allergies  Allergen Reactions   Antihistamines, Diphenhydramine-Type Hives and Other (See Comments)    Seizures   Bactrim [Sulfamethoxazole-Trimethoprim] Itching    Throat itching and suspected pill esophagitis   Demerol Hives   Morphine And Codeine Hives    Esp with high dose morphine, possibly Dilaudid Tolerates hydrocodone, oxycodone   Compazine [Prochlorperazine] Other (See Comments)    Spastic movement   Metoprolol     Per patient caused "severe headache"   Morphine Other (See Comments)   Oxycodone     Alters Personality   Statins  Other (See Comments)    Myalgias on atorvastatin 20-80mg  daily and rosuvastatin 10mg  daily    Home Medications    Prior to Admission medications   Medication Sig Start Date End Date Taking? Authorizing Provider  acetaminophen (TYLENOL) 500 MG tablet Take 1 tablet (500 mg total) by mouth every 6 (six) hours as needed. Please take every 6 hrs and stagger with Motrin 3 hrs apart from each other. Please do not exceed maximum daily dose to avoid liver damage 07/05/23   Ashok Croon, MD  amLODipine (NORVASC) 10 MG tablet TAKE 1 TABLET BY MOUTH EVERY DAY 05/21/23   Tonny Bollman, MD  aspirin 81 MG tablet Take 1 tablet (81 mg total) by mouth daily. 10/11/18   Alben Spittle, Scott T, PA-C  Bempedoic Acid-Ezetimibe (NEXLIZET) 180-10 MG TABS Take 1 tablet by mouth daily. 11/17/22    Tonny Bollman, MD  bisacodyl (DULCOLAX) 5 MG EC tablet Take 2 tablets (10 mg total) by mouth daily as needed for moderate constipation. 02/15/23   Amin, Ankit C, MD  cetirizine (ZYRTEC) 10 MG tablet Take 1 tablet (10 mg total) by mouth daily. 05/25/23   Ashok Croon, MD  cyclobenzaprine (FLEXERIL) 5 MG tablet Take 1 tablet (5 mg total) by mouth 3 (three) times daily. 02/15/23   Amin, Ankit C, MD  diazepam (VALIUM) 2 MG tablet Take 1 tablet (2 mg total) by mouth every 12 (twelve) hours. 08/15/22   Joseph Art, DO  empagliflozin (JARDIANCE) 25 MG TABS tablet Take 1 tablet (25 mg total) by mouth daily before breakfast. 11/16/22   Tonny Bollman, MD  Evolocumab (REPATHA SURECLICK) 140 MG/ML SOAJ INJECT 140 MG INTO THE SKIN EVERY 14 (FOURTEEN) DAYS. 06/16/23   Tonny Bollman, MD  fluticasone [facility]) 50 MCG/ACT nasal spray Place 2 sprays into both nostrils daily. 05/25/23   Ashok Croon, MD  gabapentin (NEURONTIN) 100 MG capsule Take 2 capsules (200 mg total) by mouth 3 (three) times daily. 02/15/23   Amin, Ankit C, MD  ibuprofen (ADVIL) 600 MG tablet Take 1 tablet (600 mg total) by mouth every 6 (six) hours. Please take every 6 hrs, and stagger this medication 3 hrs apart from Tylenol 07/05/23   Ashok Croon, MD  omeprazole 20 MG TBDD disintegrating tablet TAKE 1 CAPSULE BY MOUTH EVERY DAY 30 MINUTES BEFORE BREAKFAST IN THE MORNING    [provider]  senna-docusate (SENOKOT-S) 8.6-50 MG tablet Take 1 tablet by mouth 2 (two) times daily. While taking strongest pain meds to prevent constipation 08/07/22   Berneice Heinrich Delbert Phenix., MD  tamsulosin [facility]) 0.4 MG CAPS capsule Take 0.4 mg by mouth daily.    [provider]    Physical Exam    Vital Signs:  Nguyen Todorov does not have vital signs available for review today.  Given telephonic nature of communication, physical exam is limited. AAOx3. NAD. Normal affect.  Speech and respirations are unlabored.  Accessory Clinical  Findings    None  Assessment & Plan    1.  Preoperative Cardiovascular Risk Assessment: -Patient's RCRI score 6.6%  The patient affirms he has been doing well without any new cardiac symptoms. They are able to achieve 7 METS without cardiac limitations. Therefore, based on ACC/AHA guidelines, the patient would be at acceptable risk for the planned procedure without further cardiovascular testing. The patient was advised that if he develops new symptoms prior to surgery to contact our office to arrange for a follow-up visit, and he verbalized understanding.   The patient was  advised that if he develops new symptoms prior to surgery to contact our office to arrange for a follow-up visit, and he verbalized understanding.  Patient can hold aspirin 5 to 7 days prior to procedure  A copy of this note will be routed to requesting surgeon.  Time:   Today, I have spent 8 minutes with the patient with telehealth technology discussing medical history, symptoms, and management plan.     Napoleon Form, Leodis Rains, NP  08/11/2023, 7:41 AM

## 2023-08-23 NOTE — Pre-Procedure Instructions (Signed)
 Surgical Instructions   Your procedure is scheduled on Wednesday, September 01, 2023. Report to [facility] Main Entrance "A" at 6:30 A.M., then check in with the Admitting office. Any questions or running late day of surgery: call [phone]  Questions prior to your surgery date: call [phone], Monday-Friday, 8am-4pm. If you experience any cold or flu symptoms such as cough, fever, chills, shortness of breath, etc. between now and your scheduled surgery, please notify us at the above number.     Remember:  Do not eat or drink after midnight the night before your surgery. This includes NO gum, mints, or hard candy.    Take these medicines the morning of surgery with A SIP OF WATER : Amlodipine (Norvasc) Bempedoic Acid-Ezetimibe (Nexlizet) Cetirizine (Zyrtec) Diazepam (Valium) Fluticasone (Flonase) Gabapentin (Neurontin) Omeprazole Tamsulosin (Flomax)  May take these medicines IF NEEDED: Acetaminophen (Tylenol) Cyclobenzaprine (Flexeril)  Per your Doctor, STOP your Aspirin 5-7 days prior to surgery.  One week prior to surgery, STOP taking any Aleve, Naproxen, Ibuprofen, Motrin, Advil, Goody's, BC's, all herbal medications, fish oil, and non-prescription vitamins.  WHAT DO I DO ABOUT MY DIABETES MEDICATION?   STOP YOUR empagliflozin (JARDIANCE) 3 DAYS PRIOR TO SURGERY.  Do not take oral diabetes medicines (pills) the morning of surgery. This includes your Jardiance.   HOW TO MANAGE YOUR DIABETES BEFORE AND AFTER SURGERY  Why is it important to control my blood sugar before and after surgery? Improving blood sugar levels before and after surgery helps healing and can limit problems. A way of improving blood sugar control is eating a healthy diet by:  Eating less sugar and carbohydrates  Increasing activity/exercise  Talking with your doctor about reaching your blood sugar goals High blood sugars (greater than 180 mg/dL) can raise your risk of infections and slow your  recovery, so you will need to focus on controlling your diabetes during the weeks before surgery. Make sure that the doctor who takes care of your diabetes knows about your planned surgery including the date and location.  How do I manage my blood sugar before surgery? Check your blood sugar at least 4 times a day, starting 2 days before surgery, to make sure that the level is not too high or low.  Check your blood sugar the morning of your surgery when you wake up and every 2 hours until you get to the Short Stay unit.  If your blood sugar is less than 70 mg/dL, you will need to treat for low blood sugar: Do not take insulin. Treat a low blood sugar (less than 70 mg/dL) with  cup of clear juice (cranberry or apple), 4 glucose tablets, OR glucose gel. Recheck blood sugar in 15 minutes after treatment (to make sure it is greater than 70 mg/dL). If your blood sugar is not greater than 70 mg/dL on recheck, call [phone] for further instructions. Report your blood sugar to the short stay nurse when you get to Short Stay.  If you are admitted to the hospital after surgery: Your blood sugar will be checked by the staff and you will probably be given insulin after surgery (instead of oral diabetes medicines) to make sure you have good blood sugar levels. The goal for blood sugar control after surgery is 80-180 mg/dL.                     Do NOT Smoke (Tobacco/Vaping) for 24 hours prior to your procedure.  If you use a CPAP at night, you  may bring your mask/headgear for your overnight stay.   You will be asked to remove any contacts, glasses, piercing's, hearing aid's, dentures/partials prior to surgery. Please bring cases for these items if needed.    Patients discharged the day of surgery will not be allowed to drive home, and someone needs to stay with them for 24 hours.  SURGICAL WAITING ROOM VISITATION Patients may have no more than 2 support people in the waiting area - these visitors  may rotate.   Pre-op nurse will coordinate an appropriate time for 1 ADULT support person, who may not rotate, to accompany patient in pre-op.  Children under the age of 65 must have an adult with them who is not the patient and must remain in the main waiting area with an adult.  If the patient needs to stay at the hospital during part of their recovery, the visitor guidelines for inpatient rooms apply.  Please refer to the [facility] website for the visitor guidelines for any additional information.   If you received a COVID test during your pre-op visit  it is requested that you wear a mask when out in public, stay away from anyone that may not be feeling well and notify your surgeon if you develop symptoms. If you have been in contact with anyone that has tested positive in the last 10 days please notify you surgeon.      Pre-operative CHG Bathing Instructions   You can play a key role in reducing the risk of infection after surgery. Your skin needs to be as free of germs as possible. You can reduce the number of germs on your skin by washing with CHG (chlorhexidine gluconate) soap before surgery. CHG is an antiseptic soap that kills germs and continues to kill germs even after washing.   DO NOT use if you have an allergy to chlorhexidine/CHG or antibacterial soaps. If your skin becomes reddened or irritated, stop using the CHG and notify one of our RNs at [phone].              TAKE A SHOWER THE NIGHT BEFORE SURGERY AND THE DAY OF SURGERY    Please keep in mind the following:  DO NOT shave, including legs and underarms, 48 hours prior to surgery.   You may shave your face before/day of surgery.  Place clean sheets on your bed the night before surgery Use a clean washcloth (not used since being washed) for each shower. DO NOT sleep with pet's night before surgery.  CHG Shower Instructions:  Wash your face and private area with normal soap. If you choose to wash your hair, wash  first with your normal shampoo.  After you use shampoo/soap, rinse your hair and body thoroughly to remove shampoo/soap residue.  Turn the water OFF and apply half the bottle of CHG soap to a CLEAN washcloth.  Apply CHG soap ONLY FROM YOUR NECK DOWN TO YOUR TOES (washing for 3-5 minutes)  DO NOT use CHG soap on face, private areas, open wounds, or sores.  Pay special attention to the area where your surgery is being performed.  If you are having back surgery, having someone wash your back for you may be helpful. Wait 2 minutes after CHG soap is applied, then you may rinse off the CHG soap.  Pat dry with a clean towel  Put on clean pajamas    Additional instructions for the day of surgery: DO NOT APPLY any lotions, deodorants, cologne, or perfumes.  Do not wear jewelry or makeup Do not wear nail polish, gel polish, artificial nails, or any other type of covering on natural nails (fingers and toes) Do not bring valuables to the hospital. [facility] is not responsible for valuables/personal belongings. Put on clean/comfortable clothes.  Please brush your teeth.  Ask your nurse before applying any prescription medications to the skin.

## 2023-08-24 ENCOUNTER — Encounter (HOSPITAL_COMMUNITY)
Admission: RE | Admit: 2023-08-24 | Discharge: 2023-08-24 | Disposition: A | Discharge status: Home / Self Care | Source: Ambulatory Visit | Attending: Otolaryngology | Admitting: Otolaryngology

## 2023-08-24 ENCOUNTER — Other Ambulatory Visit: Payer: Self-pay

## 2023-08-24 ENCOUNTER — Encounter (HOSPITAL_COMMUNITY): Payer: Self-pay

## 2023-08-24 VITALS — BP 140/94 | HR 101 | Temp 98.3°F | Resp 17 | Ht 69.0 in | Wt 188.4 lb

## 2023-08-24 DIAGNOSIS — I10 Essential (primary) hypertension: Secondary | ICD-10-CM | POA: Insufficient documentation

## 2023-08-24 DIAGNOSIS — K219 Gastro-esophageal reflux disease without esophagitis: Secondary | ICD-10-CM | POA: Insufficient documentation

## 2023-08-24 DIAGNOSIS — E119 Type 2 diabetes mellitus without complications: Secondary | ICD-10-CM | POA: Diagnosis not present

## 2023-08-24 DIAGNOSIS — Z951 Presence of aortocoronary bypass graft: Secondary | ICD-10-CM | POA: Insufficient documentation

## 2023-08-24 DIAGNOSIS — Z01818 Encounter for other preprocedural examination: Secondary | ICD-10-CM

## 2023-08-24 DIAGNOSIS — Z01812 Encounter for preprocedural laboratory examination: Secondary | ICD-10-CM | POA: Diagnosis present

## 2023-08-24 DIAGNOSIS — E785 Hyperlipidemia, unspecified: Secondary | ICD-10-CM | POA: Diagnosis not present

## 2023-08-24 DIAGNOSIS — Z79899 Other long term (current) drug therapy: Secondary | ICD-10-CM | POA: Diagnosis not present

## 2023-08-24 DIAGNOSIS — I2510 Atherosclerotic heart disease of native coronary artery without angina pectoris: Secondary | ICD-10-CM | POA: Diagnosis not present

## 2023-08-24 HISTORY — DX: Umbilical hernia without obstruction or gangrene: K42.9

## 2023-08-24 LAB — CBC
HCT: 44.3 % (ref 39.0–52.0)
Hemoglobin: 13.9 g/dL (ref 13.0–17.0)
MCH: 27.6 pg (ref 26.0–34.0)
MCHC: 31.4 g/dL (ref 30.0–36.0)
MCV: 88.1 fL (ref 80.0–100.0)
Platelets: 240 10*3/uL (ref 150–400)
RBC: 5.03 MIL/uL (ref 4.22–5.81)
RDW: 12.7 % (ref 11.5–15.5)
WBC: 7 10*3/uL (ref 4.0–10.5)
nRBC: 0 % (ref 0.0–0.2)

## 2023-08-24 LAB — BASIC METABOLIC PANEL
Anion gap: 8 (ref 5–15)
BUN: 12 mg/dL (ref 6–20)
CO2: 24 mmol/L (ref 22–32)
Calcium: 8.7 mg/dL — ABNORMAL LOW (ref 8.9–10.3)
Chloride: 108 mmol/L (ref 98–111)
Creatinine, Ser: 1.27 mg/dL — ABNORMAL HIGH (ref 0.61–1.24)
GFR, Estimated: >60 mL/min (ref >60)
Glucose, Bld: 129 mg/dL — ABNORMAL HIGH (ref 70–99)
Potassium: 3.5 mmol/L (ref 3.5–5.1)
Sodium: 140 mmol/L (ref 135–145)

## 2023-08-24 LAB — GLUCOSE, CAPILLARY: Glucose-Capillary: 140 mg/dL — ABNORMAL HIGH (ref 70–99)

## 2023-08-24 LAB — HEMOGLOBIN A1C
Hgb A1c MFr Bld: 6.2 % — ABNORMAL HIGH (ref 4.8–5.6)
Mean Plasma Glucose: 131.24 mg/dL

## 2023-08-24 NOTE — Progress Notes (Signed)
 PCP - Eustaquio Boyden Cardiologist - Tonny Bollman  PPM/ICD - denies Device Orders - n/a Rep Notified - n/a  Chest x-ray - denies EKG - 05/28/23 Stress Test - 03/19/22 ECHO - 03/25/22 Cardiac Cath - 10/14/18  Sleep Study - denies CPAP - n/a  Fasting Blood Sugar - 72-105 Checks Blood Sugar 2 times a week  Last dose of GLP1 agonist-  n/a GLP1 instructions:  n/a  Patient instructed for last dose of Jardiance to be on Saturday, March 22nd.    Blood Thinner Instructions: n/a Aspirin Instructions: hold - 5-7 days prior to surgery - patient states that he has not been taking it - discussed with him the importance of following cardiologists instructions on taking this medication  ERAS Protcol - NPO PRE-SURGERY Ensure or G2-  n/a  COVID TEST- no   Anesthesia review: yes - CAD  Patient denies shortness of breath, fever, cough and chest pain at PAT appointment   All instructions explained to the patient, with a verbal understanding of the material. Patient agrees to go over the instructions while at home for a better understanding. Patient also instructed to self quarantine after being tested for COVID-19. The opportunity to ask questions was provided.  Teach back method utilized and patient was able to state the date and time of arrival for surgery.  Medications were extensively reviewed and patient was educated on what to continue to take, what to take day of surgery and diabetic medication instructions.  Patient is also aware that he needs to reach out to his PCP and cardiologist for refills on medications.

## 2023-08-24 NOTE — Progress Notes (Signed)
 Surgical Instructions   Your procedure is scheduled on Tuesday, March 26th, 2025. Report to [facility] Main Entrance "A" at 6:30 A.M., then check in with the Admitting office. Any questions or running late day of surgery: call [phone]  Questions prior to your surgery date: call [phone], Monday-Friday, 8am-4pm. If you experience any cold or flu symptoms such as cough, fever, chills, shortness of breath, etc. between now and your scheduled surgery, please notify us at the above number.     Remember:  Do not eat or drink after midnight the night before your surgery     Take these medicines the morning of surgery with A SIP OF WATER: Amlodipine (Norvasc) Bempedoic Acid-Ezetimibe (Nexlizet) Pantoprazole (Protonix) Tamsulosin (Flomax)   May take these medicines IF NEEDED: Acetaminophen (tylenol)    One week prior to surgery, STOP taking any Aspirin (unless otherwise instructed by your surgeon) Aleve, Naproxen, Ibuprofen, Motrin, Advil, Goody's, BC's, all herbal medications, fish oil, and non-prescription vitamins.   WHAT DO I DO ABOUT MY DIABETES MEDICATION?   Do not take oral diabetes medicines (pills) the morning of surgery.  Do NOT take Jardiance or Januvia on the day of surgery.   Jardiance should be held for 72 hours prior to your surgery.  Your last dose of Jardiance should be on Saturday, March 22nd.         HOW TO MANAGE YOUR DIABETES BEFORE AND AFTER SURGERY  Why is it important to control my blood sugar before and after surgery? Improving blood sugar levels before and after surgery helps healing and can limit problems. A way of improving blood sugar control is eating a healthy diet by:  Eating less sugar and carbohydrates  Increasing activity/exercise  Talking with your doctor about reaching your blood sugar goals High blood sugars (greater than 180 mg/dL) can raise your risk of infections and slow your recovery, so you will need to focus on controlling  your diabetes during the weeks before surgery. Make sure that the doctor who takes care of your diabetes knows about your planned surgery including the date and location.  How do I manage my blood sugar before surgery? Check your blood sugar at least 4 times a day, starting 2 days before surgery, to make sure that the level is not too high or low.  Check your blood sugar the morning of your surgery when you wake up and every 2 hours until you get to the Short Stay unit.  If your blood sugar is less than 70 mg/dL, you will need to treat for low blood sugar: Do not take insulin. Treat a low blood sugar (less than 70 mg/dL) with  cup of clear juice (cranberry or apple), 4 glucose tablets, OR glucose gel. Recheck blood sugar in 15 minutes after treatment (to make sure it is greater than 70 mg/dL). If your blood sugar is not greater than 70 mg/dL on recheck, call [phone] for further instructions. Report your blood sugar to the short stay nurse when you get to Short Stay.  If you are admitted to the hospital after surgery: Your blood sugar will be checked by the staff and you will probably be given insulin after surgery (instead of oral diabetes medicines) to make sure you have good blood sugar levels. The goal for blood sugar control after surgery is 80-180 mg/dL.                      Do NOT Smoke (Tobacco/Vaping) for 24 hours prior  to your procedure.  If you use a CPAP at night, you may bring your mask/headgear for your overnight stay.   You will be asked to remove any contacts, glasses, piercing's, hearing aid's, dentures/partials prior to surgery. Please bring cases for these items if needed.    Patients discharged the day of surgery will not be allowed to drive home, and someone needs to stay with them for 24 hours.  SURGICAL WAITING ROOM VISITATION Patients may have no more than 2 support people in the waiting area - these visitors may rotate.   Pre-op nurse will coordinate an  appropriate time for 1 ADULT support person, who may not rotate, to accompany patient in pre-op.  Children under the age of 13 must have an adult with them who is not the patient and must remain in the main waiting area with an adult.  If the patient needs to stay at the hospital during part of their recovery, the visitor guidelines for inpatient rooms apply.  Please refer to the [facility] website for the visitor guidelines for any additional information.   If you received a COVID test during your pre-op visit  it is requested that you wear a mask when out in public, stay away from anyone that may not be feeling well and notify your surgeon if you develop symptoms. If you have been in contact with anyone that has tested positive in the last 10 days please notify you surgeon.      Pre-operative CHG Bathing Instructions   You can play a key role in reducing the risk of infection after surgery. Your skin needs to be as free of germs as possible. You can reduce the number of germs on your skin by washing with CHG (chlorhexidine gluconate) soap before surgery. CHG is an antiseptic soap that kills germs and continues to kill germs even after washing.   DO NOT use if you have an allergy to chlorhexidine/CHG or antibacterial soaps. If your skin becomes reddened or irritated, stop using the CHG and notify one of our RNs at [phone].              TAKE A SHOWER THE NIGHT BEFORE SURGERY AND THE DAY OF SURGERY    Please keep in mind the following:  DO NOT shave, including legs and underarms, 48 hours prior to surgery.   You may shave your face before/day of surgery.  Place clean sheets on your bed the night before surgery Use a clean washcloth (not used since being washed) for each shower. DO NOT sleep with pet's night before surgery.  CHG Shower Instructions:  Wash your face and private area with normal soap. If you choose to wash your hair, wash first with your normal shampoo.  After you use  shampoo/soap, rinse your hair and body thoroughly to remove shampoo/soap residue.  Turn the water OFF and apply half the bottle of CHG soap to a CLEAN washcloth.  Apply CHG soap ONLY FROM YOUR NECK DOWN TO YOUR TOES (washing for 3-5 minutes)  DO NOT use CHG soap on face, private areas, open wounds, or sores.  Pay special attention to the area where your surgery is being performed.  If you are having back surgery, having someone wash your back for you may be helpful. Wait 2 minutes after CHG soap is applied, then you may rinse off the CHG soap.  Pat dry with a clean towel  Put on clean pajamas    Additional instructions for the day of  surgery: DO NOT APPLY any lotions, deodorants, cologne, or perfumes.   Do not wear jewelry or makeup Do not wear nail polish, gel polish, artificial nails, or any other type of covering on natural nails (fingers and toes) Do not bring valuables to the hospital. [facility] is not responsible for valuables/personal belongings. Put on clean/comfortable clothes.  Please brush your teeth.  Ask your nurse before applying any prescription medications to the skin.

## 2023-08-24 NOTE — Progress Notes (Signed)
 Surgical Instructions     Your procedure is scheduled on Wednesday, March 26th, 2025. Report to [facility] Main Entrance "A" at 6:30 A.M., then check in with the Admitting office. Any questions or running late day of surgery: call [phone]   Questions prior to your surgery date: call [phone], Monday-Friday, 8am-4pm. If you experience any cold or flu symptoms such as cough, fever, chills, shortness of breath, etc. between now and your scheduled surgery, please notify us at the above number.            Remember:       Do not eat or drink after midnight the night before your surgery            Take these medicines the morning of surgery with A SIP OF WATER: Amlodipine (Norvasc) Bempedoic Acid-Ezetimibe (Nexlizet) Pantoprazole (Protonix) Tamsulosin (Flomax)     May take these medicines IF NEEDED: Acetaminophen (tylenol)       One week prior to surgery, STOP taking any Aspirin (unless otherwise instructed by your surgeon) Aleve, Naproxen, Ibuprofen, Motrin, Advil, Goody's, BC's, all herbal medications, fish oil, and non-prescription vitamins.     WHAT DO I DO ABOUT MY DIABETES MEDICATION?     Do not take oral diabetes medicines (pills) the morning of surgery.  Do NOT take Jardiance or Januvia on the day of surgery.     Jardiance should be held for 72 hours prior to your surgery.  Your last dose of Jardiance should be on Saturday, March 22nd.                                                       HOW TO MANAGE YOUR DIABETES BEFORE AND AFTER SURGERY   Why is it important to control my blood sugar before and after surgery? Improving blood sugar levels before and after surgery helps healing and can limit problems. A way of improving blood sugar control is eating a healthy diet by:  Eating less sugar and carbohydrates  Increasing activity/exercise  Talking with your doctor about reaching your blood sugar goals High blood sugars (greater than 180 mg/dL) can raise your  risk of infections and slow your recovery, so you will need to focus on controlling your diabetes during the weeks before surgery. Make sure that the doctor who takes care of your diabetes knows about your planned surgery including the date and location.   How do I manage my blood sugar before surgery? Check your blood sugar at least 4 times a day, starting 2 days before surgery, to make sure that the level is not too high or low.   Check your blood sugar the morning of your surgery when you wake up and every 2 hours until you get to the Short Stay unit.   If your blood sugar is less than 70 mg/dL, you will need to treat for low blood sugar: Do not take insulin. Treat a low blood sugar (less than 70 mg/dL) with  cup of clear juice (cranberry or apple), 4 glucose tablets, OR glucose gel. Recheck blood sugar in 15 minutes after treatment (to make sure it is greater than 70 mg/dL). If your blood sugar is not greater than 70 mg/dL on recheck, call [phone] for further instructions. Report your blood sugar to the short stay nurse when you get to Short  Stay.   If you are admitted to the hospital after surgery: Your blood sugar will be checked by the staff and you will probably be given insulin after surgery (instead of oral diabetes medicines) to make sure you have good blood sugar levels. The goal for blood sugar control after surgery is 80-180 mg/dL.                       Do NOT Smoke (Tobacco/Vaping) for 24 hours prior to your procedure.   If you use a CPAP at night, you may bring your mask/headgear for your overnight stay.   You will be asked to remove any contacts, glasses, piercing's, hearing aid's, dentures/partials prior to surgery. Please bring cases for these items if needed.    Patients discharged the day of surgery will not be allowed to drive home, and someone needs to stay with them for 24 hours.   SURGICAL WAITING ROOM VISITATION Patients may have no more than 2 support  people in the waiting area - these visitors may rotate.   Pre-op nurse will coordinate an appropriate time for 1 ADULT support person, who may not rotate, to accompany patient in pre-op.  Children under the age of 50 must have an adult with them who is not the patient and must remain in the main waiting area with an adult.   If the patient needs to stay at the hospital during part of their recovery, the visitor guidelines for inpatient rooms apply.   Please refer to the [facility] website for the visitor guidelines for any additional information.     If you received a COVID test during your pre-op visit  it is requested that you wear a mask when out in public, stay away from anyone that may not be feeling well and notify your surgeon if you develop symptoms. If you have been in contact with anyone that has tested positive in the last 10 days please notify you surgeon.         Pre-operative CHG Bathing Instructions    You can play a key role in reducing the risk of infection after surgery. Your skin needs to be as free of germs as possible. You can reduce the number of germs on your skin by washing with CHG (chlorhexidine gluconate) soap before surgery. CHG is an antiseptic soap that kills germs and continues to kill germs even after washing.    DO NOT use if you have an allergy to chlorhexidine/CHG or antibacterial soaps. If your skin becomes reddened or irritated, stop using the CHG and notify one of our RNs at [phone].               TAKE A SHOWER THE NIGHT BEFORE SURGERY AND THE DAY OF SURGERY     Please keep in mind the following:  DO NOT shave, including legs and underarms, 48 hours prior to surgery.   You may shave your face before/day of surgery.  Place clean sheets on your bed the night before surgery Use a clean washcloth (not used since being washed) for each shower. DO NOT sleep with pet's night before surgery.   CHG Shower Instructions:  Wash your face and private area  with normal soap. If you choose to wash your hair, wash first with your normal shampoo.  After you use shampoo/soap, rinse your hair and body thoroughly to remove shampoo/soap residue.  Turn the water OFF and apply half the bottle of CHG soap to a  CLEAN washcloth.  Apply CHG soap ONLY FROM YOUR NECK DOWN TO YOUR TOES (washing for 3-5 minutes)  DO NOT use CHG soap on face, private areas, open wounds, or sores.  Pay special attention to the area where your surgery is being performed.  If you are having back surgery, having someone wash your back for you may be helpful. Wait 2 minutes after CHG soap is applied, then you may rinse off the CHG soap.  Pat dry with a clean towel  Put on clean pajamas     Additional instructions for the day of surgery: DO NOT APPLY any lotions, deodorants, cologne, or perfumes.   Do not wear jewelry or makeup Do not wear nail polish, gel polish, artificial nails, or any other type of covering on natural nails (fingers and toes) Do not bring valuables to the hospital. [facility] is not responsible for valuables/personal belongings. Put on clean/comfortable clothes.  Please brush your teeth.  Ask your nurse before applying any prescription medications to the skin.

## 2023-08-25 ENCOUNTER — Other Ambulatory Visit: Payer: Self-pay | Admitting: Cardiovascular Disease

## 2023-08-25 DIAGNOSIS — I2510 Atherosclerotic heart disease of native coronary artery without angina pectoris: Secondary | ICD-10-CM

## 2023-08-25 DIAGNOSIS — E785 Hyperlipidemia, unspecified: Secondary | ICD-10-CM

## 2023-08-25 MED ORDER — EMPAGLIFLOZIN 25 MG PO TABS: 25.0000 mg | ORAL_TABLET | Freq: Every day | ORAL | 2 refills | Status: DC

## 2023-08-25 MED ORDER — REPATHA SURECLICK 140 MG/ML ~~LOC~~ SOAJ: 140.0000 mg | SUBCUTANEOUS | 3 refills | Status: DC

## 2023-08-25 NOTE — Anesthesia Preprocedure Evaluation (Addendum)
 Anesthesia Evaluation  Patient identified by MRN, date of birth, ID band Patient awake    Reviewed: Allergy & Precautions, NPO status , Patient's Chart, lab work & pertinent test results  History of Anesthesia Complications Negative for: history of anesthetic complications  Airway Mallampati: II  TM Distance: >3 FB Neck ROM: Full    Dental  (+) Dental Advisory Given, Teeth Intact   Pulmonary sleep apnea    Pulmonary exam normal        Cardiovascular hypertension, Pt. on medications + CAD and + CABG  Normal cardiovascular exam   '23 TTE - EF 60 to 65%. Trivial MR     Neuro/Psych negative neurological ROS  negative psych ROS   GI/Hepatic Neg liver ROS,GERD  Controlled and Medicated,,  Endo/Other  diabetes, Type 2, Oral Hypoglycemic Agents    Renal/GU Renal InsufficiencyRenal disease     Musculoskeletal  (+) Arthritis ,    Abdominal   Peds  Hematology negative hematology ROS (+)   Anesthesia Other Findings   Reproductive/Obstetrics                             Anesthesia Physical Anesthesia Plan  ASA: 3  Anesthesia Plan: General   Post-op Pain Management: Tylenol PO (pre-op)*   Induction: Intravenous  PONV Risk Score and Plan: 2 and Treatment may vary due to age or medical condition, Ondansetron, Dexamethasone and Midazolam  Airway Management Planned: Oral ETT  Additional Equipment: None  Intra-op Plan:   Post-operative Plan: Extubation in OR  Informed Consent: I have reviewed the patients History and Physical, chart, labs and discussed the procedure including the risks, benefits and alternatives for the proposed anesthesia with the patient or authorized representative who has indicated his/her understanding and acceptance.     Dental advisory given  Plan Discussed with: CRNA and Anesthesiologist  Anesthesia Plan Comments: (PAT note by Antionette Poles, PA-C)         Anesthesia Quick Evaluation

## 2023-08-25 NOTE — Telephone Encounter (Signed)
*  STAT* If patient is at the pharmacy, call can be transferred to refill team.   1. Which medications need to be refilled? (please list name of each medication and dose if known)  Evolocumab (REPATHA SURECLICK) 140 MG/ML SOAJ  empagliflozin (JARDIANCE) 25 MG TABS tablet  2. Which pharmacy/location (including street and city if local pharmacy) is medication to be sent to? [id number] - [place], [place] - [address] AT [facility] [address] Phone: [phone]  Fax: [phone]     3. Do they need a 30 day or 90 day supply? 90

## 2023-08-25 NOTE — Progress Notes (Signed)
 Anesthesia Chart Review:  53 yo male follows with cardiology for hx of  CAD s/p CABG x 4 2020, HTN, HLD.  Echo 10/23 showed EF 60 to 65%, normal RV function, no significant valvular abnormalities.  Nuclear stress 03/2022 was low risk.  Carotid duplex 03/2022 showed no evidence of stenosis in bilateral ICA.  Seen by Robin Searing, NP 08/11/23 for preop eval. Per note, "-Patient's RCRI score 6.6%. The patient affirms he has been doing well without any new cardiac symptoms. They are able to achieve 7 METS without cardiac limitations. Therefore, based on ACC/AHA guidelines, the patient would be at acceptable risk for the planned procedure without further cardiovascular testing. The patient was advised that if he develops new symptoms prior to surgery to contact our office to arrange for a follow-up visit, and he verbalized understanding. The patient was advised that if he develops new symptoms prior to surgery to contact our office to arrange for a follow-up visit, and he verbalized understanding. Patient can hold aspirin 5 to 7 days prior to procedure.   Other pertinent history includes non-insulin-dependent DM2, GERD (on pantoprazole).  Preop labs reviewed, creatinine mildly elevated 1.27, otherwise unremarkable.  EKG 05/28/2023: Sinus rhythm.  Rate 72.  Right bundle branch block.  TTE 03/25/2022:  1. Left ventricular ejection fraction, by estimation, is 60 to 65%. The  left ventricle has normal function. The left ventricle has no regional  wall motion abnormalities. Left ventricular diastolic parameters were  normal.   2. Right ventricular systolic function is normal. The right ventricular  size is normal.   3. The mitral valve is normal in structure. Trivial mitral valve  regurgitation.   4. The aortic valve is tricuspid. Aortic valve regurgitation is not  visualized.   Nuclear stress 03/20/2022:   The study is normal. The study is low risk.   No ST deviation was noted.   LV perfusion is normal.  There is no evidence of ischemia. There is no evidence of infarction.   Left ventricular function is abnormal. Global function is mildly reduced. Nuclear stress EF: 48 %. The left ventricular ejection fraction is mildly decreased (45-54%). End diastolic cavity size is normal. End systolic cavity size is normal.   Prior study not available for comparison.   Normal perfusion Mild systolic dysfunction (EF 48%) with septal hypokinesis.  Consider echocardiogram for further evaluation Low risk study    Zannie Cove [facility] Short Stay Center/Anesthesiology Phone [phone] 08/25/2023 1:17 PM

## 2023-09-01 ENCOUNTER — Other Ambulatory Visit: Payer: Self-pay

## 2023-09-01 ENCOUNTER — Encounter (HOSPITAL_COMMUNITY): Payer: Self-pay

## 2023-09-01 ENCOUNTER — Ambulatory Visit (HOSPITAL_COMMUNITY): Payer: Self-pay | Admitting: Anesthesiology

## 2023-09-01 ENCOUNTER — Encounter (HOSPITAL_COMMUNITY)
Admission: AD | Disposition: A | Discharge status: Home / Self Care | Payer: Self-pay | Source: Home / Self Care | Attending: Otolaryngology

## 2023-09-01 ENCOUNTER — Observation Stay (HOSPITAL_COMMUNITY)
Admission: AD | Admit: 2023-09-01 | Discharge: 2023-09-03 | DRG: 145 | Disposition: A | Discharge status: Home / Self Care | Payer: Medicaid Other | Attending: Otolaryngology | Admitting: Otolaryngology

## 2023-09-01 ENCOUNTER — Ambulatory Visit (HOSPITAL_COMMUNITY): Payer: Self-pay | Admitting: Physician Assistant

## 2023-09-01 DIAGNOSIS — Z87442 Personal history of urinary calculi: Secondary | ICD-10-CM

## 2023-09-01 DIAGNOSIS — Z79899 Other long term (current) drug therapy: Secondary | ICD-10-CM

## 2023-09-01 DIAGNOSIS — Z809 Family history of malignant neoplasm, unspecified: Secondary | ICD-10-CM

## 2023-09-01 DIAGNOSIS — Z7984 Long term (current) use of oral hypoglycemic drugs: Secondary | ICD-10-CM | POA: Diagnosis not present

## 2023-09-01 DIAGNOSIS — I10 Essential (primary) hypertension: Secondary | ICD-10-CM | POA: Diagnosis not present

## 2023-09-01 DIAGNOSIS — K118 Other diseases of salivary glands: Secondary | ICD-10-CM

## 2023-09-01 DIAGNOSIS — Z885 Allergy status to narcotic agent status: Secondary | ICD-10-CM

## 2023-09-01 DIAGNOSIS — K219 Gastro-esophageal reflux disease without esophagitis: Secondary | ICD-10-CM | POA: Diagnosis not present

## 2023-09-01 DIAGNOSIS — Z01818 Encounter for other preprocedural examination: Secondary | ICD-10-CM

## 2023-09-01 DIAGNOSIS — E119 Type 2 diabetes mellitus without complications: Secondary | ICD-10-CM | POA: Diagnosis present

## 2023-09-01 DIAGNOSIS — Z888 Allergy status to other drugs, medicaments and biological substances status: Secondary | ICD-10-CM

## 2023-09-01 DIAGNOSIS — Z960 Presence of urogenital implants: Secondary | ICD-10-CM | POA: Diagnosis present

## 2023-09-01 DIAGNOSIS — E782 Mixed hyperlipidemia: Secondary | ICD-10-CM | POA: Diagnosis present

## 2023-09-01 DIAGNOSIS — Z951 Presence of aortocoronary bypass graft: Secondary | ICD-10-CM

## 2023-09-01 DIAGNOSIS — D37030 Neoplasm of uncertain behavior of the parotid salivary glands: Secondary | ICD-10-CM

## 2023-09-01 DIAGNOSIS — I2510 Atherosclerotic heart disease of native coronary artery without angina pectoris: Secondary | ICD-10-CM

## 2023-09-01 DIAGNOSIS — E785 Hyperlipidemia, unspecified: Secondary | ICD-10-CM | POA: Diagnosis not present

## 2023-09-01 DIAGNOSIS — N400 Enlarged prostate without lower urinary tract symptoms: Secondary | ICD-10-CM | POA: Diagnosis present

## 2023-09-01 DIAGNOSIS — M47812 Spondylosis without myelopathy or radiculopathy, cervical region: Secondary | ICD-10-CM | POA: Diagnosis present

## 2023-09-01 DIAGNOSIS — D110 Benign neoplasm of parotid gland: Secondary | ICD-10-CM | POA: Diagnosis present

## 2023-09-01 DIAGNOSIS — G4730 Sleep apnea, unspecified: Secondary | ICD-10-CM | POA: Diagnosis present

## 2023-09-01 DIAGNOSIS — Z882 Allergy status to sulfonamides status: Secondary | ICD-10-CM

## 2023-09-01 DIAGNOSIS — Z7982 Long term (current) use of aspirin: Secondary | ICD-10-CM

## 2023-09-01 DIAGNOSIS — Z8249 Family history of ischemic heart disease and other diseases of the circulatory system: Secondary | ICD-10-CM

## 2023-09-01 DIAGNOSIS — Z881 Allergy status to other antibiotic agents status: Secondary | ICD-10-CM

## 2023-09-01 DIAGNOSIS — Z9889 Other specified postprocedural states: Secondary | ICD-10-CM

## 2023-09-01 HISTORY — PX: PAROTIDECTOMY: SHX2163

## 2023-09-01 LAB — GLUCOSE, CAPILLARY
Glucose-Capillary: 94 mg/dL (ref 70–99)
Glucose-Capillary: 134 mg/dL — ABNORMAL HIGH (ref 70–99)

## 2023-09-01 LAB — CBC
HCT: 44.4 % (ref 39.0–52.0)
Hemoglobin: 14.3 g/dL (ref 13.0–17.0)
MCH: 28 pg (ref 26.0–34.0)
MCHC: 32.2 g/dL (ref 30.0–36.0)
MCV: 87.1 fL (ref 80.0–100.0)
Platelets: 246 10*3/uL (ref 150–400)
RBC: 5.1 MIL/uL (ref 4.22–5.81)
RDW: 12.6 % (ref 11.5–15.5)
WBC: 9.1 10*3/uL (ref 4.0–10.5)
nRBC: 0 % (ref 0.0–0.2)

## 2023-09-01 LAB — CREATININE, SERUM
Creatinine, Ser: 1.33 mg/dL — ABNORMAL HIGH (ref 0.61–1.24)
GFR, Estimated: >60 mL/min (ref >60)

## 2023-09-01 SURGERY — EXCISION, PAROTID GLAND
Anesthesia: General | Site: Neck | Laterality: Left

## 2023-09-01 MED ORDER — CEFAZOLIN SODIUM-DEXTROSE 1-4 GM/50ML-% IV SOLN
1.0000 g | Freq: Three times a day (TID) | INTRAVENOUS | Status: AC
  Administered 2023-09-01 – 2023-09-02 (×3): 1 g via INTRAVENOUS
  Filled 2023-09-01 (×4): qty 50

## 2023-09-01 MED ORDER — LIDOCAINE 2% (20 MG/ML) 5 ML SYRINGE
INTRAMUSCULAR | Status: AC
  Filled 2023-09-01: qty 5

## 2023-09-01 MED ORDER — LIDOCAINE-EPINEPHRINE 1 %-1:100000 IJ SOLN
INTRAMUSCULAR | Status: DC | PRN
  Administered 2023-09-01: 8 mL

## 2023-09-01 MED ORDER — PROPOFOL 10 MG/ML IV BOLUS
INTRAVENOUS | Status: DC | PRN
  Administered 2023-09-01: 10 mg via INTRAVENOUS
  Administered 2023-09-01: 150 mg via INTRAVENOUS
  Administered 2023-09-01: 50 mg via INTRAVENOUS

## 2023-09-01 MED ORDER — MIDAZOLAM HCL 2 MG/2ML IJ SOLN
INTRAMUSCULAR | Status: DC | PRN
  Administered 2023-09-01: 2 mg via INTRAVENOUS

## 2023-09-01 MED ORDER — SUCCINYLCHOLINE CHLORIDE 200 MG/10ML IV SOSY
PREFILLED_SYRINGE | INTRAVENOUS | Status: AC
  Filled 2023-09-01: qty 10

## 2023-09-01 MED ORDER — GLYCOPYRROLATE 0.2 MG/ML IJ SOLN
INTRAMUSCULAR | Status: DC | PRN
  Administered 2023-09-01: .1 mg via INTRAVENOUS

## 2023-09-01 MED ORDER — PHENYLEPHRINE 80 MCG/ML (10ML) SYRINGE FOR IV PUSH (FOR BLOOD PRESSURE SUPPORT)
PREFILLED_SYRINGE | INTRAVENOUS | Status: AC
  Filled 2023-09-01: qty 10

## 2023-09-01 MED ORDER — ENOXAPARIN SODIUM 40 MG/0.4ML IJ SOSY
40.0000 mg | PREFILLED_SYRINGE | INTRAMUSCULAR | Status: DC
  Administered 2023-09-02: 40 mg via SUBCUTANEOUS
  Filled 2023-09-01: qty 0.4

## 2023-09-01 MED ORDER — INSULIN ASPART 100 UNIT/ML IJ SOLN: 0.0000 [IU] | INTRAMUSCULAR | Status: DC | PRN

## 2023-09-01 MED ORDER — ACETAMINOPHEN 650 MG RE SUPP: 650.0000 mg | RECTAL | Status: DC | PRN

## 2023-09-01 MED ORDER — FENTANYL CITRATE (PF) 100 MCG/2ML IJ SOLN
25.0000 ug | INTRAMUSCULAR | Status: DC | PRN
Start: 2023-09-01 — End: 2023-09-01

## 2023-09-01 MED ORDER — GLYCOPYRROLATE PF 0.2 MG/ML IJ SOSY
PREFILLED_SYRINGE | INTRAMUSCULAR | Status: AC
  Filled 2023-09-01: qty 1

## 2023-09-01 MED ORDER — PROPOFOL 10 MG/ML IV BOLUS
INTRAVENOUS | Status: AC
  Filled 2023-09-01: qty 20

## 2023-09-01 MED ORDER — BISACODYL 5 MG PO TBEC
10.0000 mg | DELAYED_RELEASE_TABLET | Freq: Every day | ORAL | Status: DC | PRN
  Administered 2023-09-02 (×2): 10 mg via ORAL
  Filled 2023-09-01 (×2): qty 2

## 2023-09-01 MED ORDER — ASPIRIN 81 MG PO CHEW
81.0000 mg | CHEWABLE_TABLET | Freq: Every day | ORAL | Status: DC
  Administered 2023-09-02: 81 mg via ORAL
  Filled 2023-09-01: qty 1

## 2023-09-01 MED ORDER — FENTANYL CITRATE (PF) 250 MCG/5ML IJ SOLN
INTRAMUSCULAR | Status: AC
  Filled 2023-09-01: qty 5

## 2023-09-01 MED ORDER — PHENYLEPHRINE HCL-NACL 20-0.9 MG/250ML-% IV SOLN
INTRAVENOUS | Status: DC | PRN
  Administered 2023-09-01: 30 ug/min via INTRAVENOUS

## 2023-09-01 MED ORDER — 0.9 % SODIUM CHLORIDE (POUR BTL) OPTIME
TOPICAL | Status: DC | PRN
  Administered 2023-09-01: 1000 mL

## 2023-09-01 MED ORDER — ACETAMINOPHEN 160 MG/5ML PO SOLN: 650.0000 mg | ORAL | Status: DC | PRN

## 2023-09-01 MED ORDER — SODIUM CHLORIDE 0.9 % IV SOLN
3.0000 g | INTRAVENOUS | Status: AC
  Administered 2023-09-01: 3 g via INTRAVENOUS
  Filled 2023-09-01: qty 8

## 2023-09-01 MED ORDER — LINAGLIPTIN 5 MG PO TABS
5.0000 mg | ORAL_TABLET | Freq: Every day | ORAL | Status: DC
  Administered 2023-09-01 – 2023-09-02 (×2): 5 mg via ORAL
  Filled 2023-09-01 (×3): qty 1

## 2023-09-01 MED ORDER — ONDANSETRON HCL 4 MG/2ML IJ SOLN: 4.0000 mg | Freq: Once | INTRAMUSCULAR | Status: DC | PRN

## 2023-09-01 MED ORDER — SUCCINYLCHOLINE CHLORIDE 200 MG/10ML IV SOSY
PREFILLED_SYRINGE | INTRAVENOUS | Status: DC | PRN
  Administered 2023-09-01: 120 mg via INTRAVENOUS

## 2023-09-01 MED ORDER — LIDOCAINE 2% (20 MG/ML) 5 ML SYRINGE
INTRAMUSCULAR | Status: DC | PRN
  Administered 2023-09-01: 40 mg via INTRAVENOUS
  Administered 2023-09-01: 60 mg via INTRAVENOUS

## 2023-09-01 MED ORDER — PHENYLEPHRINE 80 MCG/ML (10ML) SYRINGE FOR IV PUSH (FOR BLOOD PRESSURE SUPPORT)
PREFILLED_SYRINGE | INTRAVENOUS | Status: DC | PRN
  Administered 2023-09-01 (×3): 80 ug via INTRAVENOUS

## 2023-09-01 MED ORDER — CHLORHEXIDINE GLUCONATE 0.12 % MT SOLN
15.0000 mL | Freq: Once | OROMUCOSAL | Status: AC
  Administered 2023-09-01: 15 mL via OROMUCOSAL
  Filled 2023-09-01: qty 15

## 2023-09-01 MED ORDER — LIDOCAINE-EPINEPHRINE 1 %-1:100000 IJ SOLN
INTRAMUSCULAR | Status: AC
  Filled 2023-09-01: qty 1

## 2023-09-01 MED ORDER — SODIUM CHLORIDE 0.9 % IV SOLN
0.1500 ug/kg/min | INTRAVENOUS | Status: AC
  Administered 2023-09-01: .1 ug/kg/min via INTRAVENOUS
  Filled 2023-09-01: qty 2000

## 2023-09-01 MED ORDER — ACETAMINOPHEN 500 MG PO TABS
1000.0000 mg | ORAL_TABLET | Freq: Once | ORAL | Status: AC
  Administered 2023-09-01: 1000 mg via ORAL
  Filled 2023-09-01: qty 2

## 2023-09-01 MED ORDER — MIDAZOLAM HCL 2 MG/2ML IJ SOLN
INTRAMUSCULAR | Status: AC
  Filled 2023-09-01: qty 2

## 2023-09-01 MED ORDER — EMPAGLIFLOZIN 25 MG PO TABS
25.0000 mg | ORAL_TABLET | Freq: Every day | ORAL | Status: DC
  Administered 2023-09-02: 25 mg via ORAL
  Filled 2023-09-01 (×2): qty 1

## 2023-09-01 MED ORDER — HYDROCODONE-ACETAMINOPHEN 5-325 MG PO TABS
1.0000 | ORAL_TABLET | ORAL | Status: DC | PRN
  Administered 2023-09-01 (×2): 1 via ORAL
  Administered 2023-09-02: 2 via ORAL
  Filled 2023-09-01 (×2): qty 2

## 2023-09-01 MED ORDER — METOPROLOL TARTRATE 5 MG/5ML IV SOLN
INTRAVENOUS | Status: DC | PRN
  Administered 2023-09-01: 2 mg via INTRAVENOUS

## 2023-09-01 MED ORDER — EVOLOCUMAB 140 MG/ML ~~LOC~~ SOAJ: 140.0000 mg | SUBCUTANEOUS | Status: DC

## 2023-09-01 MED ORDER — HYDROCODONE-ACETAMINOPHEN 5-325 MG PO TABS
ORAL_TABLET | ORAL | Status: AC
  Filled 2023-09-01: qty 1

## 2023-09-01 MED ORDER — ONDANSETRON HCL 4 MG/2ML IJ SOLN
INTRAMUSCULAR | Status: DC | PRN
  Administered 2023-09-01: 4 mg via INTRAVENOUS

## 2023-09-01 MED ORDER — ORAL CARE MOUTH RINSE: 15.0000 mL | Freq: Once | OROMUCOSAL | Status: AC

## 2023-09-01 MED ORDER — ONDANSETRON HCL 4 MG PO TABS: 4.0000 mg | ORAL_TABLET | ORAL | Status: DC | PRN

## 2023-09-01 MED ORDER — DEXAMETHASONE SODIUM PHOSPHATE 10 MG/ML IJ SOLN
INTRAMUSCULAR | Status: AC
  Filled 2023-09-01: qty 1

## 2023-09-01 MED ORDER — TAMSULOSIN HCL 0.4 MG PO CAPS: 0.4000 mg | ORAL_CAPSULE | Freq: Every day | ORAL | Status: DC | PRN

## 2023-09-01 MED ORDER — DEXAMETHASONE SODIUM PHOSPHATE 10 MG/ML IJ SOLN
INTRAMUSCULAR | Status: DC | PRN
  Administered 2023-09-01: 10 mg via INTRAVENOUS

## 2023-09-01 MED ORDER — ONDANSETRON HCL 4 MG/2ML IJ SOLN: 4.0000 mg | INTRAMUSCULAR | Status: DC | PRN

## 2023-09-01 MED ORDER — AMLODIPINE BESYLATE 10 MG PO TABS
10.0000 mg | ORAL_TABLET | Freq: Every day | ORAL | Status: DC
Start: 2023-09-02 — End: 2023-09-03
  Administered 2023-09-02: 10 mg via ORAL
  Filled 2023-09-01: qty 1

## 2023-09-01 MED ORDER — LACTATED RINGERS IV SOLN: INTRAVENOUS | Status: DC

## 2023-09-01 MED ORDER — FENTANYL CITRATE (PF) 250 MCG/5ML IJ SOLN
INTRAMUSCULAR | Status: DC | PRN
  Administered 2023-09-01: 50 ug via INTRAVENOUS
  Administered 2023-09-01: 25 ug via INTRAVENOUS
  Administered 2023-09-01: 50 ug via INTRAVENOUS

## 2023-09-01 MED ORDER — ONDANSETRON HCL 4 MG/2ML IJ SOLN
INTRAMUSCULAR | Status: AC
  Filled 2023-09-01: qty 2

## 2023-09-01 SURGICAL SUPPLY — 42 items
BENZOIN TINCTURE PRP APPL 2/3 (GAUZE/BANDAGES/DRESSINGS) ×1 IMPLANT
BLADE SURG 15 STRL LF DISP TIS (BLADE) ×1 IMPLANT
CNTNR URN SCR LID CUP LEK RST (MISCELLANEOUS) ×1 IMPLANT
CORD BIPOLAR FORCEPS 12FT (ELECTRODE) ×1 IMPLANT
DRAIN CHANNEL 10M FLAT 3/4 FLT (DRAIN) IMPLANT
DRAPE INCISE IOBAN 66X45 STRL (DRAPES) ×1 IMPLANT
DRAPE POUCH INSTRU U-SHP 10X18 (DRAPES) ×1 IMPLANT
DRSG TEGADERM 2-3/8X2-3/4 SM (GAUZE/BANDAGES/DRESSINGS) ×2 IMPLANT
DRSG TEGADERM 4X4.75 (GAUZE/BANDAGES/DRESSINGS) ×2 IMPLANT
ELECT COATED BLADE 2.86 ST (ELECTRODE) ×1 IMPLANT
ELECT PAIRED SUBDERMAL (MISCELLANEOUS) ×1 IMPLANT
ELECT REM PT RETURN 9FT ADLT (ELECTROSURGICAL) ×1 IMPLANT
ELECTRODE 4 CHANNEL SET (MISCELLANEOUS) IMPLANT
ELECTRODE PAIRED SUBDERMAL (MISCELLANEOUS) ×1 IMPLANT
ELECTRODE REM PT RTRN 9FT ADLT (ELECTROSURGICAL) ×1 IMPLANT
EVACUATOR SILICONE 100CC (DRAIN) IMPLANT
FORCEPS BIPOLAR SPETZLER 8 1.0 (NEUROSURGERY SUPPLIES) ×1 IMPLANT
GAUZE 4X4 16PLY ~~LOC~~+RFID DBL (SPONGE) ×1 IMPLANT
GAUZE SPONGE 4X4 12PLY STRL (GAUZE/BANDAGES/DRESSINGS) ×1 IMPLANT
GLOVE BIO SURGEON STRL SZ 6.5 (GLOVE) ×1 IMPLANT
GLOVE BIO SURGEON STRL SZ7.5 (GLOVE) ×1 IMPLANT
GOWN STRL REUS W/ TWL LRG LVL3 (GOWN DISPOSABLE) ×2 IMPLANT
HEMOSTAT SURGICEL 2X14 (HEMOSTASIS) ×1 IMPLANT
KIT BASIN OR (CUSTOM PROCEDURE TRAY) ×1 IMPLANT
KIT TURNOVER KIT B (KITS) ×1 IMPLANT
MANIFOLD NEPTUNE II (INSTRUMENTS) IMPLANT
NDL HYPO 25GX1X1/2 BEV (NEEDLE) ×1 IMPLANT
NEEDLE HYPO 25GX1X1/2 BEV (NEEDLE) ×1 IMPLANT
NS IRRIG 1000ML POUR BTL (IV SOLUTION) ×1 IMPLANT
PAD ARMBOARD POSITIONER FOAM (MISCELLANEOUS) ×2 IMPLANT
PENCIL SMOKE EVACUATOR (MISCELLANEOUS) ×1 IMPLANT
PROBE NERVBE PRASS .33 (MISCELLANEOUS) ×1 IMPLANT
SPONGE INTESTINAL PEANUT (DISPOSABLE) ×1 IMPLANT
STAPLER VISISTAT 35W (STAPLE) IMPLANT
STRIP CLOSURE SKIN 1/2X4 (GAUZE/BANDAGES/DRESSINGS) ×1 IMPLANT
SUT MNCRL AB 4-0 PS2 18 (SUTURE) ×2 IMPLANT
SUT SILK 2 0 REEL (SUTURE) ×1 IMPLANT
SUT SILK 2 0 SH (SUTURE) IMPLANT
SUT SILK 2 0 SH CR/8 (SUTURE) ×1 IMPLANT
SUT SILK 3 0 REEL (SUTURE) ×1 IMPLANT
SUT VIC AB 4-0 PS2 27 (SUTURE) ×1 IMPLANT
TRAY ENT MC OR (CUSTOM PROCEDURE TRAY) ×1 IMPLANT

## 2023-09-01 NOTE — Anesthesia Procedure Notes (Signed)
 Procedure Name: Intubation Date/Time: 09/01/2023 8:40 AM  Performed by: Colbert Coyer, CRNAPre-anesthesia Checklist: Patient identified, Emergency Drugs available, Suction available and Patient being monitored Patient Re-evaluated:Patient Re-evaluated prior to induction Oxygen Delivery Method: Circle System Utilized Preoxygenation: Pre-oxygenation with 100% oxygen Induction Type: IV induction Ventilation: Mask ventilation without difficulty Laryngoscope Size: Mac and 4 Grade View: Grade III Tube type: Oral Tube size: 7.5 mm Number of attempts: 1 Airway Equipment and Method: Stylet Placement Confirmation: ETT inserted through vocal cords under direct vision, positive ETCO2 and breath sounds checked- equal and bilateral Secured at: 23 cm Tube secured with: Tape Dental Injury: Teeth and Oropharynx as per pre-operative assessment

## 2023-09-01 NOTE — Transfer of Care (Signed)
 Immediate Anesthesia Transfer of Care Note  Patient: Danny Brewer  Procedure(s) Performed: EXCISION, PAROTID GLAND (Left: Neck)  Patient Location: PACU  Anesthesia Type:General  Level of Consciousness: awake and alert   Airway & Oxygen Therapy: Patient Spontanous Breathing and Patient connected to nasal cannula oxygen  Post-op Assessment: Report given to RN and Post -op Vital signs reviewed and stable  Post vital signs: Reviewed and stable  Last Vitals:  Vitals Value Taken Time  BP 150/87 09/01/23 1255  Temp    Pulse 86 09/01/23 1302  Resp 16 09/01/23 1302  SpO2 100 % 09/01/23 1302  Vitals shown include unfiled device data.  Last Pain:  Vitals:   09/01/23 0721  TempSrc:   PainSc: 5       Patients Stated Pain Goal: 3 (09/01/23 0715)  Complications: No notable events documented.

## 2023-09-01 NOTE — Anesthesia Postprocedure Evaluation (Signed)
 Anesthesia Post Note  Patient: Danny Brewer  Procedure(s) Performed: EXCISION, PAROTID GLAND (Left: Neck)     Patient location during evaluation: PACU Anesthesia Type: General Level of consciousness: awake and alert Pain management: pain level controlled Vital Signs Assessment: post-procedure vital signs reviewed and stable Respiratory status: spontaneous breathing, nonlabored ventilation and respiratory function stable Cardiovascular status: stable and blood pressure returned to baseline Anesthetic complications: no  No notable events documented.  Last Vitals:  Vitals:   09/01/23 1310 09/01/23 1315  BP: (!) 138/91 (!) 137/94  Pulse: 83 91  Resp: 14 18  Temp:    SpO2: 99% 98%    Last Pain:  Vitals:   09/01/23 1255  TempSrc:   PainSc: Asleep                 Beryle Lathe

## 2023-09-01 NOTE — H&P (Signed)
 Danny Brewer is an 53 y.o. male.    Chief Complaint:  left parotid mass  HPI: Patient presents today for planned elective procedure.  He/she denies any interval change in history since office visit on 07/29/23.  Past Medical History:  Diagnosis Date   BPH (benign prostatic hyperplasia)    Coronary Artery Disease    cardiologist--- dr Excell Seltzer;    Coronary CTA 09/2018: pLAD 70-90; pD1+pD2 70-90; mRCA mixed plaque - cannot rule out 70-90; FFR suggests hemodynamically significant stenosis // LHC 10/2018: 2v CAD >> s/p  CABG (L-LAD, L radial-RI, S-D2, S-RCA) with Dr. 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   LIMA--LAD;  SVG--D2;  SVG--dRCA;  left radial --- RI   Type 2 diabetes mellitus (HCC)    followed by pcp   (08-05-2022 checks blood sugar daily am fasting,  fasting sugar-- 77-100)   Umbilical hernia    Wears glasses     Past Surgical History:  Procedure Laterality Date   CORONARY ARTERY BYPASS GRAFT N/A 10/17/2018   Procedure: CORONARY ARTERY BYPASS GRAFTING (CABG), ON PUMP, TIMES FOUR, USING LIMA TO LAD, ENDOSCOPICALLY HARVESTED GREATER SAPHENOUS VEIN TO DIAG 2, SVG TO DISTAL RCA, AND LEFT RADIAL TO RAMUS INTERMEDIUS;  Surgeon: [name], MD;  Location: [facility];  Service: Open Heart Surgery;  Laterality: N/A;   CYSTOSCOPY WITH RETROGRADE PYELOGRAM, URETEROSCOPY AND STENT PLACEMENT Right 12/30/2021   Procedure: CYSTOSCOPY WITH RETROGRADE PYELOGRAM, URETEROSCOPY AND STENT PLACEMENT, BASKET OF STONES;  Surgeon: Sebastian Ache, MD;  Location: [facility];  Service: Urology;  Laterality: Right;   CYSTOSCOPY WITH RETROGRADE PYELOGRAM, URETEROSCOPY AND STENT PLACEMENT Left 08/07/2022   Procedure: CYSTOSCOPY WITH RETROGRADE  PYELOGRAM, URETEROSCOPY AND STENT PLACEMENT;  Surgeon: Sebastian Ache, MD;  Location: [facility];  Service: Urology;  Laterality: Left;  1 HR   ENDOVEIN HARVEST OF GREATER SAPHENOUS VEIN Right 10/17/2018   Procedure: Mack Guise Of Greater Saphenous Vein;  Surgeon: [name], MD;  Location: [facility];  Service: Open Heart Surgery;  Laterality: Right;   HAND SURGERY Left 1987   complex laceration   HOLMIUM LASER APPLICATION Left 08/07/2022   Procedure: HOLMIUM LASER APPLICATION;  Surgeon: Sebastian Ache, MD;  Location: [facility];  Service: Urology;  Laterality: Left;   INGUINAL HERNIA REPAIR Bilateral 05/27/2015   Procedure: LAPAROSCOPIC BILATERAL  INGUINAL HERNIA REPAIRS WITH MESH;  Surgeon: Karie Soda, MD;  Location: [facility];  Service: General;  Laterality: Bilateral;   KNEE ARTHROSCOPY Left 1991   LEFT HEART CATH AND CORONARY ANGIOGRAPHY N/A 10/14/2018   Procedure: LEFT HEART CATH AND CORONARY ANGIOGRAPHY;  Surgeon: Lyn Records, MD;  Location: [facility];  Service: Cardiovascular;  Laterality: N/A;   PENILE PROSTHESIS IMPLANT N/A 10/03/2020   Procedure: PLACEMENT OF PENILE PROTHESIS INFLATABLE;  Surgeon: Marcine Matar, MD;  Location: [facility];  Service: Urology;  Laterality: N/A;  pubis area   RADIAL ARTERY HARVEST Left 10/17/2018   Procedure: RADIAL ARTERY HARVEST;  Surgeon: [name], MD;  Location: [facility] OR;  Service: Open Heart Surgery;  Laterality: Left;   REMOVAL OF PENILE PROSTHESIS N/A 07/07/2021  Procedure: EXPLANT  AND REPLACEMENT OF COLOPLAST TITAN PENILE PROSTHESIS;  Surgeon: Marcine Matar, MD;  Location: [facility];  Service: Urology;  Laterality: N/A;   SCROTAL EXPLORATION N/A 07/07/2021   Procedure: EXPLORATION OF PENILE PROSTHESIS;  Surgeon: Marcine Matar, MD;  Location: [facility];  Service: Urology;  Laterality: N/A;   SHOULDER ARTHROSCOPY Right 07/08/2022   @[facility]  by dr supple;   for impingment   SHOULDER CLOSED REDUCTION Left  09/04/2019   Procedure: CLOSED MANIPULATION SHOULDER;  Surgeon: Dannielle Huh, MD;  Location: [facility];  Service: Orthopedics;  Laterality: Left;   SHOULDER SURGERY Left 2022   TEE WITHOUT CARDIOVERSION N/A 10/17/2018   Procedure: TRANSESOPHAGEAL ECHOCARDIOGRAM (TEE);  Surgeon: [name], MD;  Location: [facility] OR;  Service: Open Heart Surgery;  Laterality: N/A;    Family History  Problem Relation Age of Onset   Hypertension Mother    Cancer Father    Heart failure Father    Sudden death Brother    Heart attack Brother    Hypertension Sister    Heart murmur Sister     Social History:  reports that he has never smoked. He has never used smokeless tobacco. He reports that he does not drink alcohol and does not use drugs.  Allergies:  Allergies  Allergen Reactions   Antihistamines, Diphenhydramine-Type Hives and Other (See Comments)    Seizures   Bactrim [Sulfamethoxazole-Trimethoprim] Itching    Throat itching and suspected pill esophagitis   Demerol Hives   Morphine And Codeine Hives    Esp with high dose morphine, possibly Dilaudid Tolerates hydrocodone, oxycodone   Compazine [Prochlorperazine] Other (See Comments)    Spastic movement   Metoprolol     Per patient caused "severe headache"   Morphine Other (See Comments)   Oxycodone     Alters Personality   Statins Other (See Comments)    Myalgias on atorvastatin 20-80mg  daily and rosuvastatin 10mg  daily    Facility-Administered Medications Prior to Admission  Medication Dose Route Frequency Provider Last Rate Last Admin   incobotulinumtoxinA (XEOMIN) 100 units injection 70 Units  70 Units Intramuscular Q90 days Asa Lente, MD   70 Units at 12/09/22 1438   Medications Prior to Admission  Medication Sig Dispense Refill   acetaminophen (TYLENOL) 500 MG tablet Take 1 tablet (500 mg total) by mouth every 6 (six) hours as needed. Please take every 6 hrs and stagger with Motrin 3 hrs apart from each other. Please do not  exceed maximum daily dose to avoid liver damage 30 tablet 1   amLODipine (NORVASC) 10 MG tablet TAKE 1 TABLET BY MOUTH EVERY DAY 90 tablet 3   aspirin 81 MG tablet Take 1 tablet (81 mg total) by mouth daily.     empagliflozin (JARDIANCE) 25 MG TABS tablet Take 1 tablet (25 mg total) by mouth daily before breakfast. 90 tablet 2   Evolocumab (REPATHA SURECLICK) 140 MG/ML SOAJ Inject 140 mg into the skin every 14 (fourteen) days. 6 mL 3   pantoprazole (PROTONIX) 40 MG tablet Take 40 mg by mouth daily.     sitaGLIPtin (JANUVIA) 100 MG tablet Take 1 tablet by mouth daily.     tamsulosin (FLOMAX) 0.4 MG CAPS capsule Take 0.4 mg by mouth daily as needed (for prostate).     bisacodyl (DULCOLAX) 5 MG EC tablet Take 2 tablets (10 mg total) by mouth daily as needed for moderate constipation. 30 tablet 0   cyclobenzaprine (FLEXERIL) 5 MG tablet Take 1 tablet (5 mg  total) by mouth 3 (three) times daily. (Patient not taking: Reported on 08/24/2023) 30 tablet 0   diazepam (VALIUM) 2 MG tablet Take 1 tablet (2 mg total) by mouth every 12 (twelve) hours. (Patient not taking: Reported on 08/24/2023) 20 tablet 0   fluticasone (FLONASE) 50 MCG/ACT nasal spray Place 2 sprays into both nostrils daily. (Patient not taking: Reported on 08/24/2023) 16 g 6   ibuprofen (ADVIL) 600 MG tablet Take 1 tablet (600 mg total) by mouth every 6 (six) hours. Please take every 6 hrs, and stagger this medication 3 hrs apart from Tylenol (Patient not taking: Reported on 08/24/2023) 30 tablet 0   omeprazole 20 MG TBDD disintegrating tablet TAKE 1 CAPSULE BY MOUTH EVERY DAY 30 MINUTES BEFORE BREAKFAST IN THE MORNING      Results for orders placed or performed during the hospital encounter of 09/01/23 (from the past 48 hours)  Glucose, capillary     Status: None   Collection Time: 09/01/23  6:52 AM  Result Value Ref Range   Glucose-Capillary 94 70 - 99 mg/dL    Comment: Glucose reference range applies only to samples taken after fasting for  at least 8 hours.   No results found.  ROS: Review of Systems  Constitutional:  Negative for chills and fever.  HENT:  Negative for ear pain.   Respiratory:  Negative for cough.   Cardiovascular:  Negative for chest pain.  Gastrointestinal:  Negative for nausea and vomiting.  Musculoskeletal:  Negative for neck pain.  Neurological:  Negative for headaches.  Psychiatric/Behavioral:  The patient does not have insomnia.     Blood pressure (!) 142/99, pulse 86, temperature 98 F (36.7 C), temperature source Oral, resp. rate 18, height 5\' 9"  (1.753 m), weight 82.6 kg, SpO2 99%.  PHYSICAL EXAM: Physical Exam Constitutional:      Appearance: Normal appearance.  HENT:     Head: Normocephalic and atraumatic.     Nose: Nose normal.     Mouth/Throat:     Mouth: Mucous membranes are moist.  Eyes:     Extraocular Movements: Extraocular movements intact.     Pupils: Pupils are equal, round, and reactive to light.  Cardiovascular:     Rate and Rhythm: Normal rate.     Pulses: Normal pulses.  Pulmonary:     Effort: Pulmonary effort is normal. No respiratory distress.     Breath sounds: No stridor.  Abdominal:     General: Abdomen is flat.  Musculoskeletal:        General: No swelling.  Skin:    General: Skin is warm and dry.  Neurological:     Mental Status: He is alert and oriented to person, place, and time.  Psychiatric:        Mood and Affect: Mood normal.     Studies Reviewed: MRI neck 07/07/23 IMPRESSION: 1. 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 2. No cervical lymphadenopathy.   Assessment/Plan Left parotid mass, FNA c/w pleomorphic adenoma, pt with pressure and discomfort in the area  - risks and benefits discussed and patient would like to proceed with left superficial parotidectomy    Krysti Hickling 09/01/2023, 8:15 AM

## 2023-09-01 NOTE — Op Note (Addendum)
 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 She was advised to undergo superficial parotidectomy with facial nerve dissection. Risks and benefits of the procedure were discussed with the patient and family, and they elected to proceed.   Description:  The patient was brought to the operating room and placed on the operative table in the supine position.  Anesthesia was induced and the patient was intubated by the anesthesia team without difficulty.  The patient was given intravenous antibiotics.  The left parotid incision was marked with a marking pen and injected with local anesthetic.  The nerve integrity monitor was placed along the face for facial nerve monitoring and turned on during the case.  The right face and neck were prepped and draped in sterile fashion.  A modified Blair incision was made with a 15 blade scalpel and extended through the subcutaneous and platysma layers with Bovie electrocautery.  A pre-parotid flap was elevated anteriorly and the earlobe was freed.  Skin flaps were sutured back with stay sutures.  Dissection was then performed anterior to the tragus and parotid tissue was elevated off of the mastoid.  With further dissection to the stylomastoid foramen, the main trunk of the facial nerve was identified. Primary branch of the great auricular nerve was identified and reflected laterally.  The facial nerve was identified in its expected  location. Careful sequential dissection of the branches was performed to bring the mass up and out of the neck.The nerve was dissected to the main division and then down the inferior branches, dividing parotid tissue using bipolar electrocautery.  The marginal mandibular nerve was traced in an antegrade fashion until the inferior gland was able to be freed and the nerve kept intact.  The nerve stimulator was used for assistance.  The inferior parotid gland with the mass was then dissected from surrounding tissues and removed.  This was passed to nursing for pathology.  The surgical site was then irrigated copiously with saline. A Valsalva maneuver was performed and there was no bleeding. A 10 French suction drain was placed in the depth of the wound and secured at the skin with 2-0 Silk with a standard drain stitch.  The flaps were released and laid back down. The deep layers were closed with 3-0 Vicryls. The skin was closed with 5-0 plain gut suture.  The drain was placed to bulb suction.Steri strips added to the incision.  Drapes were removed and the drain was secured to the shoulder with tape.  The patient was then returned to anesthesia for wake-up and was extubated and moved to the recovery room in stable condition. All counts were correct.   The advanced practice practitioner (APP) assisted throughout the case. The APP was essential in retraction and counter traction when needed to make the case progress smoothly. This retraction and assistance made it possible to see the tissue planes for the procedure. The assistance was needed for blood control, tissue re-approximation and assisted with closure of the incision site

## 2023-09-02 ENCOUNTER — Other Ambulatory Visit: Payer: Self-pay

## 2023-09-02 ENCOUNTER — Encounter (HOSPITAL_COMMUNITY): Payer: Self-pay | Admitting: Otolaryngology

## 2023-09-02 DIAGNOSIS — Z881 Allergy status to other antibiotic agents status: Secondary | ICD-10-CM | POA: Diagnosis not present

## 2023-09-02 DIAGNOSIS — D110 Benign neoplasm of parotid gland: Secondary | ICD-10-CM | POA: Diagnosis not present

## 2023-09-02 DIAGNOSIS — K219 Gastro-esophageal reflux disease without esophagitis: Secondary | ICD-10-CM | POA: Diagnosis present

## 2023-09-02 DIAGNOSIS — E782 Mixed hyperlipidemia: Secondary | ICD-10-CM | POA: Diagnosis present

## 2023-09-02 DIAGNOSIS — Z809 Family history of malignant neoplasm, unspecified: Secondary | ICD-10-CM | POA: Diagnosis not present

## 2023-09-02 DIAGNOSIS — Z960 Presence of urogenital implants: Secondary | ICD-10-CM | POA: Diagnosis present

## 2023-09-02 DIAGNOSIS — M47812 Spondylosis without myelopathy or radiculopathy, cervical region: Secondary | ICD-10-CM | POA: Diagnosis present

## 2023-09-02 DIAGNOSIS — Z888 Allergy status to other drugs, medicaments and biological substances status: Secondary | ICD-10-CM | POA: Diagnosis not present

## 2023-09-02 DIAGNOSIS — Z7984 Long term (current) use of oral hypoglycemic drugs: Secondary | ICD-10-CM | POA: Diagnosis not present

## 2023-09-02 DIAGNOSIS — N400 Enlarged prostate without lower urinary tract symptoms: Secondary | ICD-10-CM | POA: Diagnosis present

## 2023-09-02 DIAGNOSIS — I10 Essential (primary) hypertension: Secondary | ICD-10-CM | POA: Diagnosis present

## 2023-09-02 DIAGNOSIS — Z7982 Long term (current) use of aspirin: Secondary | ICD-10-CM | POA: Diagnosis not present

## 2023-09-02 DIAGNOSIS — Z882 Allergy status to sulfonamides status: Secondary | ICD-10-CM | POA: Diagnosis not present

## 2023-09-02 DIAGNOSIS — Z9889 Other specified postprocedural states: Secondary | ICD-10-CM | POA: Diagnosis not present

## 2023-09-02 DIAGNOSIS — G4730 Sleep apnea, unspecified: Secondary | ICD-10-CM | POA: Diagnosis present

## 2023-09-02 DIAGNOSIS — E119 Type 2 diabetes mellitus without complications: Secondary | ICD-10-CM | POA: Diagnosis present

## 2023-09-02 DIAGNOSIS — Z8249 Family history of ischemic heart disease and other diseases of the circulatory system: Secondary | ICD-10-CM | POA: Diagnosis not present

## 2023-09-02 DIAGNOSIS — Z79899 Other long term (current) drug therapy: Secondary | ICD-10-CM | POA: Diagnosis not present

## 2023-09-02 DIAGNOSIS — Z885 Allergy status to narcotic agent status: Secondary | ICD-10-CM | POA: Diagnosis not present

## 2023-09-02 DIAGNOSIS — Z87442 Personal history of urinary calculi: Secondary | ICD-10-CM | POA: Diagnosis not present

## 2023-09-02 DIAGNOSIS — I2510 Atherosclerotic heart disease of native coronary artery without angina pectoris: Secondary | ICD-10-CM | POA: Diagnosis present

## 2023-09-02 DIAGNOSIS — Z951 Presence of aortocoronary bypass graft: Secondary | ICD-10-CM | POA: Diagnosis not present

## 2023-09-02 LAB — SURGICAL PATHOLOGY

## 2023-09-02 MED ORDER — ACETAMINOPHEN 325 MG PO TABS
650.0000 mg | ORAL_TABLET | Freq: Four times a day (QID) | ORAL | Status: DC
  Administered 2023-09-02 – 2023-09-03 (×4): 650 mg via ORAL
  Filled 2023-09-02 (×4): qty 2

## 2023-09-02 MED ORDER — OXYCODONE HCL 5 MG PO TABS
5.0000 mg | ORAL_TABLET | Freq: Four times a day (QID) | ORAL | Status: DC | PRN
  Administered 2023-09-02 (×2): 5 mg via ORAL
  Filled 2023-09-02 (×2): qty 1

## 2023-09-02 MED ORDER — IBUPROFEN 600 MG PO TABS
600.0000 mg | ORAL_TABLET | Freq: Four times a day (QID) | ORAL | Status: DC
  Administered 2023-09-02 – 2023-09-03 (×4): 600 mg via ORAL
  Filled 2023-09-02 (×4): qty 1

## 2023-09-02 MED ORDER — HYDROMORPHONE HCL 1 MG/ML IJ SOLN
0.5000 mg | Freq: Four times a day (QID) | INTRAMUSCULAR | Status: DC | PRN
  Administered 2023-09-02 – 2023-09-03 (×4): 0.5 mg via INTRAVENOUS
  Filled 2023-09-02 (×4): qty 0.5

## 2023-09-02 NOTE — Plan of Care (Signed)
  Problem: Pain Managment: Goal: General experience of comfort will improve and/or be controlled Outcome: Progressing   Problem: Safety: Goal: Ability to remain free from injury will improve Outcome: Progressing

## 2023-09-02 NOTE — Progress Notes (Signed)
 ENT PROGRESS NOTE   Subjective: Patient seen and examined at bedside. No issues overnight. POD#1 status post left superficial parotidectomy with facial nerve dissection.  Had some pain along the surgical site.  Denies facial weakness or paresthesias.  Tolerating p.o. no nausea or vomiting no chest pain or shortness of breath.  Objective: Vital signs in last 24 hours: Temp:  [97.6 F (36.4 C)-98.6 F (37 C)] 97.7 F (36.5 C) (03/27 0531) Pulse Rate:  [60-94] 70 (03/27 0531) Resp:  [12-22] 16 (03/26 1751) BP: (117-146)/(80-109) 122/80 (03/27 0531) SpO2:  [98 %-100 %] 100 % (03/27 0531)  Physical Exam Alert and oriented well-appearing Cranial nerve exam with fully intact cranial nerve VII Left preauricular incision clean dry intact JP in place output approximately 40 cc over the last 24 hours serosanguineous No edema or erythema around the incision or along the neck   Recent Labs    09/01/23 1830  CREATININE 1.33*    Assessment/Plan: Postop day 1 status post left superficial parotidectomy with facial nerve dissection, doing well but pain is not well-controlled.  Patient would like to stay until pain is under better control.  Will change status to admitted.   Scheduled Tylenol and Motrin every 6 hours staggered 3 hours apart Oxycodone 5 mg every 6 hours as needed for severe pain 0.5 mg Dilaudid IV every 6 hours for pain not controlled with p.o. pain medications Continue regular diet Out of bed ambulate today Keep JP in place for now will likely remove tomorrow  Dispo: Home tomorrow if pain is well-controlled  Ashok Croon, MD 09/02/2023, 7:14 AM

## 2023-09-03 DIAGNOSIS — D110 Benign neoplasm of parotid gland: Secondary | ICD-10-CM | POA: Diagnosis not present

## 2023-09-03 MED ORDER — IBUPROFEN 600 MG PO TABS: 600.0000 mg | ORAL_TABLET | Freq: Four times a day (QID) | ORAL | 0 refills | Status: AC

## 2023-09-03 MED ORDER — AMOXICILLIN-POT CLAVULANATE 500-125 MG PO TABS: 1.0000 | ORAL_TABLET | Freq: Two times a day (BID) | ORAL | 0 refills | Status: DC

## 2023-09-03 MED ORDER — ACETAMINOPHEN 500 MG PO TABS: 500.0000 mg | ORAL_TABLET | Freq: Four times a day (QID) | ORAL | 0 refills | Status: DC

## 2023-09-03 MED ORDER — OXYCODONE HCL 5 MG PO TABS: 5.0000 mg | ORAL_TABLET | ORAL | 0 refills | Status: DC | PRN

## 2023-09-03 NOTE — Discharge Instructions (Signed)
 Parotid Surgery Postoperative Care Instructions:  The Surgery Itself Parotid surgery involves general anesthesia, typically for 2-3 hours. Patients may  be quite sedated for several hours after surgery and may remain sleepy for much  of the day. Nausea and vomiting are occasionally seen, and usually resolve by  the evening of surgery - even without additional medications. Some patients stay overnight in the hospital; other patients can go home the evening of surgery.  Most patients will have a drain in place after surgery, which is usually removed in the office 2-4 days after surgery.   Your Incision Your incision is closed with sutures, there may be tape or skin glue over your  incision. Do not remove this tape or the glue. You can shower and wash your hair  as usual starting 24 hours after your drain is removed. You may wash in a  bathtub prior to that time if you are careful not to get your neck wet. Use a dab of  Bacitracin ointment on your drain site (under and behind your ear) before and  after showering. It is normal to have some red or pink drainage from your drain  exit site for 1-2 days after the drain is removed. Do not soak or scrub the incision.  You might notice swelling and bruising around your incision, upper neck and face  after surgery. In addition, the scar may become pink and hard. This hardening  will peak at about 6 weeks and may result in some tightness, which will  disappear over the next 2 to 3 months. You will have numbness of the skin  around the incision and the lower ear on the side of surgery. This will resolve  slowly after surgery except on the earlobe, where some patients have permanent  numbness. Be very careful when shaving if your neck skin is numb. You should  apply sunscreen on your incision site starting 1 month after surgery EVERY day  for the first year after surgery. This will prevent a red or pink scar and give you  the best cosmetic result for  your scar. A daily moisturizer with sunscreen  (example Oil of Olay with SPF 30) is fine.   Limitations You can start resuming normal activities as tolerated 7 days after surgery. For  some patients, lifting can cause pain and stretching at the surgery site for up to 3  weeks after surgery. You should not drive or drink alcohol while taking pain  medications. Most people can return to work/school 1-2 weeks after surgery,  but there may be physical limitations as far as what you may do while at work. Your  surgeon will review your specific limitations and release you when you are ready  to return to work.   Medications ? Pain medication can be used for pain as prescribed. Pain is expected after  surgery. Your neck and face will be sore and pain will be worse when the  neck is stretched and moved. As the surgical site heals, pain will resolve  over the course of a week. It is not uncommon for pain to get worse when  you first go home because your activity may increase but from that point  on the pain should improve every day. Pain medications can cause  nausea, which can be prevented if you take them with food or milk. ? You may be given a stool softener (Colace) because pain medications may  make you constipated. It is recommended that you use these  starting right  after surgery -- you may discontinue if you find that you are having normal  or loose stools. ? Antibiotic ointment should be applied to the drain exit site three times a  day for 2 days after the drain is removed. It should also be applied before  and after showering for 24 hours after the drain is removed. Bacitracin  ointment can also be applied to the incision once the tape is removed or  the glue peels off. This prevents scabbing and itching. ? Take all of your routine medications as prescribed, unless told otherwise  by your surgeon. Any medications that thin the blood should be avoided.  ? IT IS OK TO TAKE OVER THE  COUNTER PAIN MEDICATION  (IBUPROFEN, NAPROXEN, or ACETAMINOPHEN) IN ADDITION TO  YOUR PRESCRIBED MEDICATIONS. DO NOT TAKE ASPIRIN UNLESS  CLEARED WITH YOUR SURGEON.  ? Limit Acetaminophen/Tylenol to less than 4,000mg /day  ? Limit Ibuprofen/Motrin to less than 3,600mg /day  Pain ? The main complaint following parotid surgery is pain with eating,  swallowing and neck movement. Some people experience a dull ache,  while others feel a sharp pain. This should not keep you from eating  anything you want and will improve daily after surgery. When the sensation  returns to the skin of the neck and ear, there may be feelings like electric  shocks from the sensory nerves returning to normal function. The nerve  that controls movement of the facial muscles is exposed during parotid  surgery. Because of this, some patients have weakness in those muscles  after surgery from swelling which will resolve with time. This may make the face feel "heavy" or "sluggish."  Cough If your operation was done under general anesthesia, you may feel like you have  phlegm in your throat or a sore throat. This is usually because there was a tube  in your windpipe while you were asleep that caused irritation that you perceive as  phlegm. You will notice that if you cough, very little phlegm will come up. This  should clear up in 4 to 5 days.  Reasons to call your surgeon's office ? Persistent fever over 101 F ? Bleeding from the neck incision ? Increasing facial or neck swelling ? Sudden loss of facial movement ? Pain that is not relieved by your medications ? Purulent drainage (pus) from the incision ? Redness surrounding the incision that is worsening or getting bigger  Please record output of your drain and bring the log with you to your appointment on Monday 09/06/23. We will use this information to determine if the drain is ready to come out.   How to Contact Your Surgeon Please call [phone] (Dr  Leighton Roach office) with any questions or concerns after the surgery

## 2023-09-03 NOTE — Discharge Summary (Signed)
 Physician Discharge Summary  Patient ID: Danny Brewer MRN: [id number] DOB/AGE: 55/25/1972 55 y.o.  Admit date: 09/01/2023 Discharge date: 09/03/2023  Admission Diagnoses:  Discharge Diagnoses:  Principal Problem:   Benign mixed parotid tumor Active Problems:   Neoplasm of uncertain behavior of parotid gland   Parotid mass   Discharged Condition: good  Hospital Course:  53 year old male with left parotid mass, who underwent left superficial parotidectomy with facial nerve dissection on 09/01/2023 and was admitted for observation following the surgery.  The pain was well-controlled, patient was tolerating regular diet and ambulating without issues.  He denied facial weakness postop, and during the hospital stay JP output was monitored and on the day of discharge it was decided to discharge the patient with JP drain in place due to output of 49 cc/24 hrs. he was discharged on postop day 2 in good condition with JP in place, with the plan to remove JP in 2 days during his postop follow-up in the office.   Consults: None  Significant Diagnostic Studies: Pathology consistent with benign pleomorphic adenoma  Treatments: surgery: Left superficial parotidectomy with facial nerve dissection  Discharge Exam: Blood pressure (!) 145/90, pulse 77, temperature 98 F (36.7 C), temperature source Oral, resp. rate 18, height 5\' 9"  (1.753 m), weight 82.6 kg, SpO2 98%. General appearance: alert and cooperative Head: Normocephalic, without obvious abnormality, atraumatic Eyes: conjunctivae/corneas clear. PERRL, EOM's intact. Fundi benign. Ears: normal TM's and external ear canals both ears Nose: Nares normal. Septum midline. Mucosa normal. No drainage or sinus tenderness. Throat: lips, mucosa, and tongue normal; teeth and gums normal Neck: no adenopathy, supple, symmetrical, trachea midline, and thyroid not enlarged, symmetric, no tenderness/mass/nodules Resp: normal effort Cardio: normal rhythm on  tele Extremities: extremities normal, atraumatic, no cyanosis or edema Pulses: 2+ and symmetric Skin: Skin color, texture, turgor normal. No rashes or lesions Lymph nodes: Cervical, supraclavicular, and axillary nodes normal. Neurologic: Alert and oriented X 3, normal strength and tone. Normal symmetric reflexes. Normal coordination and gait Incision/Wound: C/D/I JP with serosanguinous output  Disposition: Discharge disposition: 01-Home or Self Care        Allergies as of 09/03/2023       Reactions   Antihistamines, Diphenhydramine-type Hives, Other (See Comments)   Seizures   Bactrim [sulfamethoxazole-trimethoprim] Itching   Throat itching and suspected pill esophagitis   Demerol Hives   Morphine And Codeine Hives   Esp with high dose morphine, possibly Dilaudid Tolerates hydrocodone, oxycodone   Compazine [prochlorperazine] Other (See Comments)   Spastic movement   Metoprolol    Per patient caused "severe headache"   Morphine Other (See Comments)   Oxycodone    Alters Personality   Statins Other (See Comments)   Myalgias on atorvastatin 20-80mg  daily and rosuvastatin 10mg  daily        Medication List     TAKE these medications    amoxicillin-clavulanate 500-125 MG tablet Commonly known as: Augmentin Take 1 tablet by mouth 2 (two) times daily for 5 days.   oxyCODONE 5 MG immediate release tablet Commonly known as: Roxicodone Take 1 tablet (5 mg total) by mouth every 4 (four) hours as needed for severe pain (pain score 7-10) (take for pain not controlled with Tylenol and Motrin).       ASK your doctor about these medications    acetaminophen 500 MG tablet Commonly known as: TYLENOL Take 1 tablet (500 mg total) by mouth every 6 (six) hours as needed. Please take every 6 hrs and stagger with Motrin  3 hrs apart from each other. Please do not exceed maximum daily dose to avoid liver damage Ask about: Which instructions should I use?   acetaminophen 500 MG  tablet Commonly known as: TYLENOL Take 1 tablet (500 mg total) by mouth every 6 (six) hours. Take every 6 hrs x 2-3 days then take every 6 hrs as needed Ask about: Which instructions should I use?   amLODipine 10 MG tablet Commonly known as: NORVASC TAKE 1 TABLET BY MOUTH EVERY DAY   aspirin 81 MG tablet Take 1 tablet (81 mg total) by mouth daily.   bisacodyl 5 MG EC tablet Commonly known as: DULCOLAX Take 2 tablets (10 mg total) by mouth daily as needed for moderate constipation.   cyclobenzaprine 5 MG tablet Commonly known as: FLEXERIL Take 1 tablet (5 mg total) by mouth 3 (three) times daily.   diazepam 2 MG tablet Commonly known as: VALIUM Take 1 tablet (2 mg total) by mouth every 12 (twelve) hours.   empagliflozin 25 MG Tabs tablet Commonly known as: Jardiance Take 1 tablet (25 mg total) by mouth daily before breakfast.   fluticasone 50 MCG/ACT nasal spray Commonly known as: FLONASE Place 2 sprays into both nostrils daily.   ibuprofen 600 MG tablet Commonly known as: ADVIL Take 1 tablet (600 mg total) by mouth every 6 (six) hours. Please take every 6 hrs, and stagger this medication 3 hrs apart from Tylenol Ask about: Which instructions should I use?   ibuprofen 600 MG tablet Commonly known as: ADVIL Take 1 tablet (600 mg total) by mouth every 6 (six) hours. Please take every 6 hrs, and stagger this medication 3 hrs apart from Tylenol Ask about: Which instructions should I use?   Januvia 100 MG tablet Generic drug: sitaGLIPtin Take 1 tablet by mouth daily.   omeprazole 20 MG Tbdd disintegrating tablet TAKE 1 CAPSULE BY MOUTH EVERY DAY 30 MINUTES BEFORE BREAKFAST IN THE MORNING   pantoprazole 40 MG tablet Commonly known as: PROTONIX Take 40 mg by mouth daily.   Repatha SureClick 140 MG/ML Soaj Generic drug: Evolocumab Inject 140 mg into the skin every 14 (fourteen) days.   tamsulosin 0.4 MG Caps capsule Commonly known as: FLOMAX Take 0.4 mg by mouth  daily as needed (for prostate).         Signed: Ashok Croon 09/03/2023, 7:35 AM

## 2023-09-03 NOTE — Progress Notes (Signed)
 ENT PROGRESS NOTE   Subjective: Patient seen and examined at bedside. No issues overnight. POD#2 status post left superficial parotidectomy with facial nerve dissection.  Had some pain along the surgical site, but it was better controlled on a new pain regimen.  Denies facial weakness or paresthesias.  Tolerating p.o. no nausea or vomiting no chest pain or shortness of breath. JP output of 49 ml/24 hrs serosanguinous. No other issues overnight.   Objective: Vital signs in last 24 hours: Temp:  [97.7 F (36.5 C)-98.1 F (36.7 C)] 98 F (36.7 C) (03/28 0506) Pulse Rate:  [60-91] 77 (03/28 0506) Resp:  [15-18] 18 (03/27 1700) BP: (121-145)/(83-90) 145/90 (03/28 0506) SpO2:  [97 %-100 %] 98 % (03/28 0506)  Physical Exam Alert and oriented well-appearing Cranial nerve exam with fully intact cranial nerve VII Left preauricular incision clean dry intact JP in place output approximately 49 cc over the last 24 hours serosanguineous No edema or erythema around the incision or along the neck   Recent Labs    09/01/23 1830  CREATININE 1.33*    Assessment/Plan: Postop day 2 status post left superficial parotidectomy with facial nerve dissection, doing well but pain is not well-controlled.  Patient would like to stay until pain is under better control.  Will change status to admitted.   Scheduled Tylenol and Motrin every 6 hours staggered 3 hours apart Oxycodone 5 mg every 6 hours as needed for severe pain Continue regular diet Out of bed ambulate today Will send home with JP, patient to come in for drain removal to our office Monday 09/06/23  Dispo: Home today with JP drain  Ashok Croon, MD 09/03/2023, 7:25 AM

## 2023-09-03 NOTE — Plan of Care (Signed)
  Problem: Education: Goal: Knowledge of General Education information will improve Description: Including pain rating scale, medication(s)/side effects and non-pharmacologic comfort measures Outcome: Progressing   Problem: Health Behavior/Discharge Planning: Goal: Ability to manage health-related needs will improve Outcome: Progressing   Problem: Activity: Goal: Risk for activity intolerance will decrease Outcome: Progressing   Problem: Nutrition: Goal: Adequate nutrition will be maintained Outcome: Progressing   Problem: Coping: Goal: Level of anxiety will decrease Outcome: Progressing   Problem: Pain Managment: Goal: General experience of comfort will improve and/or be controlled Outcome: Progressing   Problem: Safety: Goal: Ability to remain free from injury will improve Outcome: Progressing   Problem: Skin Integrity: Goal: Risk for impaired skin integrity will decrease Outcome: Progressing   Problem: Education: Goal: Knowledge of the prescribed therapeutic regimen will improve Outcome: Progressing   Problem: Nutrition: Goal: Maintenance of adequate nutrition will improve Outcome: Progressing   Problem: Skin Integrity: Goal: Demonstration of wound healing without infection will improve Outcome: Progressing

## 2023-09-03 NOTE — Progress Notes (Signed)
 Discharge instructions given to pt. Pt verbalized understanding of all teaching. Pt going home with JP drain per Ashok Croon, MD. Pt taught how to empty JP drain and return it back to charge position. Pt verbalized ability to be able to do this at home.

## 2023-09-06 ENCOUNTER — Ambulatory Visit (INDEPENDENT_AMBULATORY_CARE_PROVIDER_SITE_OTHER): Admitting: Otolaryngology

## 2023-09-06 VITALS — BP 140/97 | HR 99 | Temp 98.0°F

## 2023-09-06 DIAGNOSIS — D490 Neoplasm of unspecified behavior of digestive system: Secondary | ICD-10-CM

## 2023-09-06 DIAGNOSIS — Z9889 Other specified postprocedural states: Secondary | ICD-10-CM

## 2023-09-06 DIAGNOSIS — D110 Benign neoplasm of parotid gland: Secondary | ICD-10-CM

## 2023-09-06 NOTE — Progress Notes (Signed)
 ENT Progress Note   Discussed the use of AI scribe software for clinical note transcription with the patient, who gave verbal consent to proceed.  History of Present Illness Danny Brewer is a 53 year old male who presents for follow-up after surgery for pleomorphic adenoma, now POD#5 after left superficial parotidectomy.   He experienced a fever of 102F yesterday, which has since resolved to 46F. Over the weekend, he noted significant discomfort but reports improvement since then. The drainage from the surgical site has decreased over the weekend.  He describes discomfort when chewing but denies facial weakness trismus. Sensation in the area is slowly returning, although it is not yet fully restored. The pressure previously felt near his left jaw is now gone, attributed to the removal of the tumor.  He is currently managing pain with ibuprofen, having discontinued oxycodone due to adverse effects such as mood swings.  PE BP (!) 140/97 (BP Location: Right Arm, Patient Position: Sitting, Cuff Size: Normal)   Pulse 99   Temp 98 F (36.7 C)   SpO2 99%   Left pre-auricular incision c/d/I JP with straw color output small amount No palpable fluctuance, incision without surrounding erythema and no drainage  JP removed during exam  Pathology  A. PAROTID TAIL MASS, LEFT, EXCISION:  - Benign pleomorphic adenoma, 1.8 cm  - Resection margins are negative for tumor  - Benign lymph node  - No evidence of malignancy   A/P POD#5 s/p left parotid mass removal doing well   RTC at 2 weeks post-op

## 2023-09-08 ENCOUNTER — Ambulatory Visit (INDEPENDENT_AMBULATORY_CARE_PROVIDER_SITE_OTHER): Admitting: Otolaryngology

## 2023-09-08 ENCOUNTER — Encounter (INDEPENDENT_AMBULATORY_CARE_PROVIDER_SITE_OTHER): Payer: Self-pay | Admitting: Otolaryngology

## 2023-09-08 ENCOUNTER — Telehealth (INDEPENDENT_AMBULATORY_CARE_PROVIDER_SITE_OTHER): Payer: Self-pay

## 2023-09-08 VITALS — BP 123/85 | HR 103 | Temp 98.7°F

## 2023-09-08 DIAGNOSIS — D490 Neoplasm of unspecified behavior of digestive system: Secondary | ICD-10-CM

## 2023-09-08 DIAGNOSIS — D110 Benign neoplasm of parotid gland: Secondary | ICD-10-CM

## 2023-09-08 DIAGNOSIS — Z9889 Other specified postprocedural states: Secondary | ICD-10-CM

## 2023-09-08 DIAGNOSIS — R5082 Postprocedural fever: Secondary | ICD-10-CM

## 2023-09-08 MED ORDER — AMOXICILLIN-POT CLAVULANATE 875-125 MG PO TABS: 1.0000 | ORAL_TABLET | Freq: Two times a day (BID) | ORAL | 0 refills | Status: DC

## 2023-09-08 MED ORDER — METHYLPREDNISOLONE 4 MG PO TBPK: ORAL_TABLET | ORAL | 1 refills | Status: DC

## 2023-09-08 NOTE — Progress Notes (Signed)
 ENT Progress Note   Discussed the use of AI scribe software for clinical note transcription with the patient, who gave verbal consent to proceed.  History of Present Illness Kelsen Celona is a 53 year old male who presents for follow-up after surgery for pleomorphic adenoma, now POD#7 after left superficial parotidectomy, final pathology c/w pleomorphic adenoma.   He experienced a fever this am 102F, which has since resolved. Her reports some night sweats as well. Had left ear discomfort and feels fatigued. Denies cough, dyspnea, chest pain. Denies nasal congestion, post-nasal drainage, sore throat. Denies drainage around the surgical incision or left facial swelling. Denies dysuria.   He is currently managing pain with ibuprofen, having discontinued oxycodone, but had to take a dose of Oxy last night due to pain.   PE BP 123/85 (BP Location: Right Arm, Cuff Size: Normal)   Pulse (!) 103   Temp 98.7 F (37.1 C) (Oral)   SpO2 96%   Left pre-auricular incision c/d/I No palpable fluctuance, incision without surrounding erythema and no drainage  B/l ear exam without middle ear effusion  CN 2-12 intact  Oropharynx w/o exudate or erythema   Pathology  A. PAROTID TAIL MASS, LEFT, EXCISION:  - Benign pleomorphic adenoma, 1.8 cm  - Resection margins are negative for tumor  - Benign lymph node  - No evidence of malignancy   A/P Encounter Diagnoses  Name Primary?   Post-procedural fever Yes   Parotid neoplasm    Parotid pleomorphic adenoma     POD#7 s/p left parotid mass removal doing well aside from fever and some pain. Denies drainage or swelling around the surgical site. Denies any other sx. Exam is overall reassuring. We discussed that this could represent early signs of post-op infection. We will try a course of Augmentin and Medrol Pack. He will return for f/u in 2 weeks. If sx worsen or he develops new sx, patient to contact us for an order to get CXR and for additional workup,  although post-op PNA/UTI are very unlikely due to lack of sx.  RTC at 2 weeks post-op  Rx sent for Augmentin and Medrol pack        Gorman Safi is a 53 year old male who presents with fever and ear pain post-surgery.  He has been experiencing fever primarily in the morning, with temperatures reaching 102F, and cold sweats noted the previous day. The fever began approximately one week after undergoing a surgical procedure. No shortness of breath or cough is present, and he maintains adequate water intake.  He describes significant ear pain, noting it as 'hurting real bad' with an inner itch, which he attributes to healing. There is no drainage from the ear, and he has been diligent in keeping the area clean.  He feels lethargic and notes that his balance is 'off a little bit,' especially when walking. He experiences fatigue but denies any burning with urination, congestion, or increased pain level.  He is currently taking ibuprofen during the day and oxycodone at night for pain management, having taken oxycodone the previous night due to increased pain and difficulty sleeping.  He has a history of diabetes managed with Januvia and denies any allergies to antibiotics, although he has a known allergy to Bactrim.

## 2023-09-08 NOTE — Telephone Encounter (Signed)
 Patient seen today

## 2023-09-15 ENCOUNTER — Encounter (INDEPENDENT_AMBULATORY_CARE_PROVIDER_SITE_OTHER): Payer: Self-pay | Admitting: Otolaryngology

## 2023-09-15 ENCOUNTER — Ambulatory Visit (INDEPENDENT_AMBULATORY_CARE_PROVIDER_SITE_OTHER): Payer: Medicaid Other | Admitting: Otolaryngology

## 2023-09-15 VITALS — BP 127/81 | HR 82

## 2023-09-15 DIAGNOSIS — D110 Benign neoplasm of parotid gland: Secondary | ICD-10-CM

## 2023-09-15 DIAGNOSIS — K118 Other diseases of salivary glands: Secondary | ICD-10-CM

## 2023-09-15 DIAGNOSIS — Z9889 Other specified postprocedural states: Secondary | ICD-10-CM

## 2023-09-16 NOTE — Progress Notes (Signed)
 ENT Progress Note   Discussed the use of AI scribe software for clinical note transcription with the patient, who gave verbal consent to proceed.  History of Present Illness Danny Brewer is a 53 year old male who presents for follow-up after surgery for pleomorphic adenoma, s/p after left superficial parotidectomy, final pathology c/w pleomorphic adenoma.   Fevers resolved, his pain is under better control and he stopped Oxycodone. No facial swelling, no drainage along the incision site. Still has some numbness around the left ear lobe.   PE BP 127/81 (Cuff Size: Normal)   Pulse 82   SpO2 96%   Left pre-auricular incision c/d/I No palpable fluctuance, incision without surrounding erythema and no drainage  B/l ear exam without middle ear effusion  CN 2-12 intact  Oropharynx w/o exudate or erythema   Pathology  A. PAROTID TAIL MASS, LEFT, EXCISION:  - Benign pleomorphic adenoma, 1.8 cm  - Resection margins are negative for tumor  - Benign lymph node  - No evidence of malignancy   A/P Encounter Diagnoses  Name Primary?   Parotid pleomorphic adenoma Yes   Parotid mass      2 weeks s/p left parotid mass removal doing well. Fevers resolved (he took Augmentin/Medrol pack as directed). Denies any other sx. Exam is overall reassuring. Incision is healing well.   - wound care instructions d/w pt, RTC PRN

## 2023-10-21 NOTE — Progress Notes (Deleted)
 Cardiology Office Note:    Date:  10/21/2023  ID:  Danny Brewer, DOB 08-Dec-1970, MRN [id number] PCP: Mordechai April, DO  [facility] Providers Cardiologist:  Arnoldo Lapping, MD Cardiology APP:  Gabino Joe, PA-C { Click to update primary MD,subspecialty MD or APP then REFRESH:1}    {Click to Open Review  :1}   Patient Profile:      Coronary artery disease  s/p CABG 10/2018 Echocardiogram 09/19/18: EF 60-65 Intol of beta-blocker  Myoview  03/20/2022: EF 48, normal perfusion, low risk Echo 03/25/2022 EF 60-65, no RWMA, normal RVSF, trivial MR Diabetes mellitus 2 Hypertension  Hyperlipidemia  Intol of statins >> Rx w Alirocumab  (Praluent ) Carotid stenosis  US  10/21/20: Bilateral ICA 1-39 US  03/21/22: no ICA stenosis Bilat ED  Nephrolithiasis (s/p stone extraction in July 2023)            Discussed the use of AI scribe software for clinical note transcription with the patient, who gave verbal consent to proceed.  History of Present Illness Danny Brewer is a 53 y.o. male who returns for follow-up of CAD.  He was last seen by Dr. Arlester Ladd in November 2024.  He was seen for evaluation of palpitations.  Decision at that time was to continue to monitor symptoms and forego any further testing.    ROS-See HPI***       Studies Reviewed:       *** Results Labs-chart review 09/01/2023: Hgb 14.3 08/24/2023: K 3.5, creatinine 1.27 02/13/2023: ALT 20 12/29/2022: Total cholesterol 122, HDL 62, triglycerides 58, LDL 47   Risk Assessment/Calculations:   {Does this patient have ATRIAL FIBRILLATION?[phone]} No BP recorded.  {Refresh Note OR Click here to enter BP  :1}***       Physical Exam:   VS:  There were no vitals taken for this visit.   Wt Readings from Last 3 Encounters:  09/01/23 182 lb (82.6 kg)  08/24/23 188 lb 6.4 oz (85.5 kg)  05/28/23 182 lb 1.6 oz (82.6 kg)    Physical Exam***     Assessment and Plan:  Assessment and Plan Assessment & Plan    {   Coronary artery disease Hx of CABG in 2020. He has had a lot of chest symptoms since his surgery.  Recent Myoview  low risk.  Echocardiogram demonstrated normal LV function.  I will refer him back to cardiothoracic surgery to see if there is anything anatomically that could be done to help alleviate his chest pain.  If not, we may need to consider sending him to pain management or physical therapy.  Continue aspirin  81 g daily, Praluent  75 mg every 14 days.   Preoperative cardiovascular examination Mr. Bergkamp perioperative risk of a major cardiac event is 0.9% according to the Revised Cardiac Risk Index (RCRI).  Therefore, he is at low risk for perioperative complications.    Recommendations: According to ACC/AHA guidelines, no further cardiovascular testing needed.  The patient may proceed to surgery at acceptable risk.   Antiplatelet and/or Anticoagulation Recommendations: Aspirin  can be held for 7 days prior to his surgery.  Please resume Aspirin  post operatively when it is felt to be safe from a bleeding standpoint.    Essential hypertension Blood pressure above target.  Increase amlodipine  to 10 mg daily.  Send blood pressure readings for review in 2 weeks.  If blood pressure remains uncontrolled, consider adding ACE/ARB.   Hyperlipidemia LDL in February 69.  Continue Praluent  75 mg every 2 weeks, Zetia  10 mg daily.  BPPV (benign paroxysmal positional vertigo) He notes a long history of positional vertigo.  I will refer him to vestibular rehab.    :1}    {Are you ordering a CV Procedure (e.g. stress test, cath, DCCV, TEE, etc)?   Press F2        :[id number]}  Dispo:  No follow-ups on file.  Signed, Marlyse Single, PA-C

## 2023-10-22 ENCOUNTER — Ambulatory Visit: Attending: Physician Assistant | Admitting: Physician Assistant

## 2023-10-22 DIAGNOSIS — E7800 Pure hypercholesterolemia, unspecified: Secondary | ICD-10-CM

## 2023-10-22 DIAGNOSIS — I10 Essential (primary) hypertension: Secondary | ICD-10-CM

## 2023-10-22 DIAGNOSIS — I2510 Atherosclerotic heart disease of native coronary artery without angina pectoris: Secondary | ICD-10-CM

## 2023-11-04 ENCOUNTER — Telehealth: Payer: Self-pay | Admitting: Neurology

## 2023-11-04 NOTE — Telephone Encounter (Signed)
 Called pt. Scheduled work in appt for him to see Dr Godwin Lat 11/08/23 at 9am.

## 2023-11-04 NOTE — Telephone Encounter (Signed)
 Pt said last couple of weeks have been dealing with real bad headaches. Have been taking Ibuprofen  and Tylenol . Would like to schedule a Botox  appointment. Would like a call back

## 2023-11-08 ENCOUNTER — Ambulatory Visit (INDEPENDENT_AMBULATORY_CARE_PROVIDER_SITE_OTHER): Admitting: Otolaryngology

## 2023-11-08 ENCOUNTER — Ambulatory Visit (INDEPENDENT_AMBULATORY_CARE_PROVIDER_SITE_OTHER): Admitting: Neurology

## 2023-11-08 ENCOUNTER — Telehealth: Payer: Self-pay | Admitting: Neurology

## 2023-11-08 ENCOUNTER — Encounter: Payer: Self-pay | Admitting: Neurology

## 2023-11-08 ENCOUNTER — Encounter (INDEPENDENT_AMBULATORY_CARE_PROVIDER_SITE_OTHER): Payer: Self-pay | Admitting: Otolaryngology

## 2023-11-08 VITALS — BP 122/87 | HR 91

## 2023-11-08 VITALS — BP 130/70 | HR 88 | Ht 69.0 in | Wt 183.0 lb

## 2023-11-08 DIAGNOSIS — H9192 Unspecified hearing loss, left ear: Secondary | ICD-10-CM

## 2023-11-08 DIAGNOSIS — R1310 Dysphagia, unspecified: Secondary | ICD-10-CM | POA: Diagnosis not present

## 2023-11-08 DIAGNOSIS — G5132 Clonic hemifacial spasm, left: Secondary | ICD-10-CM | POA: Diagnosis not present

## 2023-11-08 DIAGNOSIS — R208 Other disturbances of skin sensation: Secondary | ICD-10-CM

## 2023-11-08 DIAGNOSIS — M7552 Bursitis of left shoulder: Secondary | ICD-10-CM | POA: Diagnosis not present

## 2023-11-08 DIAGNOSIS — R519 Headache, unspecified: Secondary | ICD-10-CM | POA: Diagnosis not present

## 2023-11-08 DIAGNOSIS — Z9889 Other specified postprocedural states: Secondary | ICD-10-CM

## 2023-11-08 MED ORDER — INCOBOTULINUMTOXINA 100 UNITS IM SOLR
70.0000 [IU] | Freq: Once | INTRAMUSCULAR | Status: AC
  Administered 2023-11-08: 70 [IU] via INTRAMUSCULAR

## 2023-11-08 MED ORDER — GABAPENTIN 100 MG PO CAPS
100.0000 mg | ORAL_CAPSULE | Freq: Two times a day (BID) | ORAL | 3 refills | Status: DC | PRN
Start: 2023-11-08 — End: 2023-11-08

## 2023-11-08 MED ORDER — METHYLPREDNISOLONE ACETATE 80 MG/ML IJ SUSP
40.0000 mg | Freq: Once | INTRAMUSCULAR | Status: AC
Start: 2023-11-08 — End: 2023-11-08
  Administered 2023-11-08: 40 mg via INTRAMUSCULAR

## 2023-11-08 MED ORDER — PREGABALIN 25 MG PO CAPS: 25.0000 mg | ORAL_CAPSULE | Freq: Two times a day (BID) | ORAL | 1 refills | Status: DC | PRN

## 2023-11-08 NOTE — Patient Instructions (Signed)

## 2023-11-08 NOTE — Progress Notes (Addendum)
 [facility]  PATIENT: Danny Brewer DOB: 09-Feb-1971  REFERRING DOCTOR OR PCP:  Eliane Grooms is PCP; referred by Orvan Blanch SOURCE: Patient, notes from Dr. Leighton Punches, multiple MRI and CT reports and images on PACS  _________________________________   HISTORICAL  CHIEF COMPLAINT:  Chief Complaint  Patient presents with   Follow-up    RM 11, alone. Last seen 12/09/22 for Botox /hemifacial spasm.  He is here today wanting to be evaluated for Botox  to treat headaches.  Headaches started 2-3 wk ago. Pain located back of head. BP okay. Getting about 2 per week.      HISTORY OF PRESENT ILLNESS:  Update 12/09/2022 He felt the HFS improved a lot after the Xeomin  injections   He got 5 good months but now is experiencing some spasms again.   He has not had return of the jaw spasms.  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  Denies ptosis or facial weakness.     He has had some dysphagia and has known GERD.   There does not appear to be an association of the dysphagia temporally with the chemodenervation injections.  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     Before the Botox /Xeomin , he had HFS and some episodes of the left jaw tightening up for 30-90 minutes about once a month,  This is painful  like intense pins and needles.  Sometimes his happene after eating .  The painful jaw tightening also improved with chemodenervation.    He continues to note occasional headaches but these have gotten better since he started the Botox /Xeomin .  These are usually occipital  Left shoulder is painful and pain increases with arm raised or rotated  Vascular risks:  Type 2 NIDDM (since 2018), HTN, elevated cholesterol.      He  denies any difficulty with gait, balance, numbness or strength now.   He sleeps well most nights.  He tries to  Live a stress free life.    No depression or anxiety.      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    MRI of the cervical spine 03/11/2017. It showed degenerative changes at C4-C5, C5-C6 and C6-C7 but also showed a small T2 hyperintense focus within the right dorsolateral pons. He was referred for further evaluation.  MRI of the brain dated 08/25/2015 and 02/23/2010 show a stable 3-4 mm focus in the posterior right pons near the middle cerebellar peduncle. It is unchanged in size or signal characteristics.  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     MRI brain 04/22/2020 showed just one T2/FLAIR hyperintense focus in the right posterior pons, present in 2017 and one left temporal chronic microhemorrhage, not present in 2017     REVIEW OF SYSTEMS: Constitutional: No fevers, chills, sweats, or change in appetite.   He has excessive sleepiness Eyes: No visual changes, double vision, eye pain Ear, nose and throat: No hearing loss, ear pain, nasal congestion, sore throat Cardiovascular: No chest pain, palpitations Respiratory:  No shortness of breath at rest or with exertion.   No wheezes.  He snores GastrointestinaI: No nausea, vomiting, diarrhea, abdominal pain, fecal incontinence Genitourinary:  No dysuria, urinary retention or frequency.  No nocturia. Musculoskeletal:  No neck pain, back pain Integumentary: No rash, pruritus, skin lesions Neurological: as above Psychiatric: No depression at this time.  No anxiety Endocrine: No palpitations, diaphoresis, change in appetite, change in weigh or increased thirst Hematologic/Lymphatic:  No anemia, purpura, petechiae. Allergic/Immunologic: No itchy/runny eyes, nasal congestion, recent allergic  reactions, rashes  ALLERGIES: Allergies  Allergen Reactions   Antihistamines, Diphenhydramine -Type Hives and Other (See Comments)    Seizures   Bactrim  [Sulfamethoxazole -Trimethoprim ] Itching    Throat itching and suspected pill esophagitis   Demerol  Hives   Morphine  And Codeine Hives    Esp with high dose morphine , possibly Dilaudid  Tolerates hydrocodone , oxycodone    Compazine  [Prochlorperazine ] Other (See Comments)    Spastic movement   Metoprolol      Per patient caused "severe headache"   Morphine  Other (See Comments)   Oxycodone      Alters Personality   Statins Other (See Comments)    Myalgias on atorvastatin  20-80mg  daily and rosuvastatin  10mg  daily    HOME MEDICATIONS:  Current Outpatient Medications:    acetaminophen  (TYLENOL ) 500 MG tablet, Take 1 tablet (500 mg total) by mouth every 6 (six) hours as needed. Please take every 6 hrs and stagger with Motrin  3 hrs apart from each other. Please do not exceed maximum daily dose to avoid liver damage, Disp: 30 tablet, Rfl: 1   aspirin  81 MG tablet, Take 1 tablet (81 mg total) by mouth daily., Disp: , Rfl:    empagliflozin  (JARDIANCE ) 25 MG TABS tablet, Take 1 tablet (25 mg total) by mouth daily before breakfast., Disp: 90 tablet, Rfl: 2   ibuprofen  (ADVIL ) 600 MG tablet, Take 1 tablet (600 mg total) by mouth every 6 (six) hours. Please take every 6 hrs, and stagger this medication 3 hrs apart from Tylenol , Disp: 30 tablet, Rfl: 0   pantoprazole  (PROTONIX ) 40 MG tablet, Take 40 mg by mouth daily., Disp: , Rfl:    tamsulosin  (FLOMAX ) 0.4 MG CAPS capsule, Take 0.4 mg by mouth daily as needed (for prostate)., Disp: , Rfl:   Current Facility-Administered Medications:    incobotulinumtoxinA  (XEOMIN ) 100 units injection 70 Units, 70 Units, Intramuscular, Q90 days, Jamen Loiseau, Sherida Dimmer, MD, 70 Units at 12/09/22 1438  PAST MEDICAL HISTORY: Past Medical History:  Diagnosis Date   BPH (benign prostatic hyperplasia)    Coronary Artery  Disease    cardiologist--- dr Arlester Ladd;    Coronary CTA 09/2018: pLAD 70-90; pD1+pD2 70-90; mRCA mixed plaque - cannot rule out 70-90; FFR suggests hemodynamically significant stenosis // LHC 10/2018: 2v CAD >> s/p  CABG (L-LAD, L radial-RI, S-D2, S-RCA) with Dr. 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   LIMA--LAD;  SVG--D2;  SVG--dRCA;  left radial --- RI   Type 2 diabetes mellitus (HCC)    followed by pcp   (08-05-2022 checks blood sugar daily am fasting,  fasting sugar-- 77-100)   Umbilical hernia    Wears glasses     PAST SURGICAL HISTORY: Past Surgical History:  Procedure Laterality  Date   CORONARY ARTERY BYPASS GRAFT N/A 10/17/2018   Procedure: CORONARY ARTERY BYPASS GRAFTING (CABG), ON PUMP, TIMES FOUR, USING LIMA TO LAD, ENDOSCOPICALLY HARVESTED GREATER SAPHENOUS VEIN TO DIAG 2, SVG TO DISTAL RCA, AND LEFT RADIAL TO RAMUS INTERMEDIUS;  Surgeon: Norita Beauvais, MD;  Location: [facility] OR;  Service: Open Heart Surgery;  Laterality: N/A;   CYSTOSCOPY WITH RETROGRADE PYELOGRAM, URETEROSCOPY AND STENT PLACEMENT Right 12/30/2021   Procedure: CYSTOSCOPY WITH RETROGRADE PYELOGRAM, URETEROSCOPY AND STENT PLACEMENT, BASKET OF STONES;  Surgeon: Osborn Blaze, MD;  Location: [facility];  Service: Urology;  Laterality: Right;   CYSTOSCOPY WITH RETROGRADE PYELOGRAM, URETEROSCOPY AND STENT PLACEMENT Left 08/07/2022   Procedure: CYSTOSCOPY WITH RETROGRADE PYELOGRAM, URETEROSCOPY AND STENT PLACEMENT;  Surgeon: Osborn Blaze, MD;  Location: [facility];  Service: Urology;  Laterality: Left;  1 HR   ENDOVEIN HARVEST OF GREATER SAPHENOUS VEIN Right 10/17/2018   Procedure: Astrid Blamer Of Greater Saphenous Vein;  Surgeon: Norita Beauvais, MD;   Location: [facility] OR;  Service: Open Heart Surgery;  Laterality: Right;   HAND SURGERY Left 1987   complex laceration   HOLMIUM LASER APPLICATION Left 08/07/2022   Procedure: HOLMIUM LASER APPLICATION;  Surgeon: Osborn Blaze, MD;  Location: [facility];  Service: Urology;  Laterality: Left;   INGUINAL HERNIA REPAIR Bilateral 05/27/2015   Procedure: LAPAROSCOPIC BILATERAL  INGUINAL HERNIA REPAIRS WITH MESH;  Surgeon: Candyce Champagne, MD;  Location: [facility];  Service: General;  Laterality: Bilateral;   KNEE ARTHROSCOPY Left 1991   LEFT HEART CATH AND CORONARY ANGIOGRAPHY N/A 10/14/2018   Procedure: LEFT HEART CATH AND CORONARY ANGIOGRAPHY;  Surgeon: Arty Binning, MD;  Location: [facility];  Service: Cardiovascular;  Laterality: N/A;   PAROTIDECTOMY Left 09/01/2023   Procedure: EXCISION, PAROTID GLAND;  Surgeon: Artice Last, MD;  Location: [facility];  Service: ENT;  Laterality: Left;   PENILE PROSTHESIS IMPLANT N/A 10/03/2020   Procedure: PLACEMENT OF PENILE PROTHESIS INFLATABLE;  Surgeon: Trent Frizzle, MD;  Location: [facility];  Service: Urology;  Laterality: N/A;  pubis area   RADIAL ARTERY HARVEST Left 10/17/2018   Procedure: RADIAL ARTERY HARVEST;  Surgeon: Norita Beauvais, MD;  Location: [facility] OR;  Service: Open Heart Surgery;  Laterality: Left;   REMOVAL OF PENILE PROSTHESIS N/A 07/07/2021   Procedure: EXPLANT  AND REPLACEMENT OF COLOPLAST TITAN PENILE PROSTHESIS;  Surgeon: Trent Frizzle, MD;  Location: [facility];  Service: Urology;  Laterality: N/A;   SCROTAL EXPLORATION N/A 07/07/2021   Procedure: EXPLORATION OF PENILE PROSTHESIS;  Surgeon: Trent Frizzle, MD;  Location: [facility];  Service: Urology;  Laterality: N/A;   SHOULDER ARTHROSCOPY Right 07/08/2022   @[facility]  by dr supple;   for impingment   SHOULDER CLOSED REDUCTION Left 09/04/2019   Procedure: CLOSED MANIPULATION SHOULDER;  Surgeon: Christie Cox, MD;  Location: [facility];  Service: Orthopedics;  Laterality:  Left;   SHOULDER SURGERY Left 2022   TEE WITHOUT CARDIOVERSION N/A 10/17/2018   Procedure: TRANSESOPHAGEAL ECHOCARDIOGRAM (TEE);  Surgeon: Norita Beauvais, MD;  Location[facility] OR;  Service: Open Heart Surgery;  Laterality: N/A;    FAMILY HISTORY: Family History  Problem Relation Age of Onset   Hypertension Mother    Cancer Father    Heart failure Father    Sudden death Brother    Heart attack Brother    Hypertension Sister    Heart murmur Sister     SOCIAL HISTORY:  Social History   Socioeconomic History   Marital status:  Married    Spouse name: Not on file   Number of children: Not on file   Years of education: 14   Highest education level: Not on file  Occupational History   Occupation: [employer]  Tobacco Use   Smoking status: Never   Smokeless tobacco: Never  Vaping Use   Vaping status: Never Used  Substance and Sexual Activity   Alcohol use: No   Drug use: Never   Sexual activity: Not on file  Other Topics Concern   Not on file  Social History Narrative   Right handed   Caffeine use: 1 tea/day   Social Drivers of Health   Financial Resource Strain: Not on file  Food Insecurity: No Food Insecurity (09/01/2023)   Hunger Vital Sign    Worried About Running Out of Food in the Last Year: Never true    Ran Out of Food in the Last Year: Never true  Transportation Needs: No Transportation Needs (09/01/2023)   PRAPARE - Administrator, Civil Service (Medical): No    Lack of Transportation (Non-Medical): No  Physical Activity: Not on file  Stress: Not on file  Social Connections: Not on file  Intimate Partner Violence: Not At Risk (09/02/2023)   Humiliation, Afraid, Rape, and Kick questionnaire    Fear of Current or Ex-Partner: No    Emotionally Abused: No    Physically Abused: No    Sexually Abused: No     PHYSICAL EXAM  Vitals:   11/08/23 0854  BP: 130/70  Pulse: 88  SpO2: 97%  Weight: 183 lb (83 kg)  Height: 5\' 9"  (1.753 m)      Body mass index is 27.02 kg/m.   General: The patient is well-developed and well-nourished and in no acute distress.  Pharynx is Mallampati 2  Neck:   The neck is nontender   Skin: Extremities are without significant edema.     Musculoskeletal: He is tender over the left subacromial bursa.  Range of motion was normal though there was pain with the arm elevated  Neurologic Exam  Mental status: The patient is alert and oriented x 3 at the time of the examination. The patient has apparent normal recent and remote memory, with an apparently normal attention span and concentration ability.   Speech is normal.  Cranial nerves: Occasional facial spasms noted.  Extraocular movements are full.  Facial strength and sensation is normal.  Hearing is normal  Motor:  Muscle bulk is normal.   Tone is normal. Strength is  5 / 5 in all 4 extremities.   Sensory: Sensory testing is intact to pinprick, soft touch and vibration sensation in all 4 extremities.  Gait and station: Station is normal.   Gait is normal. Tandem gait is normal. Romberg is negative.        DIAGNOSTIC DATA (LABS, IMAGING, TESTING) - I reviewed patient records, labs, notes, testing and imaging myself where available.  Lab Results  Component Value Date   WBC 9.1 09/01/2023   HGB 14.3 09/01/2023   HCT 44.4 09/01/2023   MCV 87.1 09/01/2023   PLT 246 09/01/2023      Component Value Date/Time   NA 140 08/24/2023 1430   NA 140 12/29/2022 1022   K 3.5 08/24/2023 1430   CL 108 08/24/2023 1430   CO2 24 08/24/2023 1430   GLUCOSE 129 (H) 08/24/2023 1430   BUN 12 08/24/2023 1430   BUN 12 12/29/2022 1022   CREATININE 1.33 (H)  09/01/2023 1830   CREATININE 0.93 12/18/2014 1157   CALCIUM  8.7 (L) 08/24/2023 1430   PROT 7.7 02/13/2023 1345   PROT 6.8 12/29/2022 1022   ALBUMIN  4.0 02/13/2023 1345   ALBUMIN  4.3 12/29/2022 1022   AST 25 02/13/2023 1345   ALT 20 02/13/2023 1345   ALKPHOS 55 02/13/2023 1345   BILITOT 0.7  02/13/2023 1345   BILITOT 0.3 12/29/2022 1022   GFRNONAA >60 09/01/2023 1830   GFRNONAA >89 12/18/2014 1157   GFRAA >60 11/09/2019 0854   GFRAA >89 12/18/2014 1157          ASSESSMENT AND PLAN  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    If the Xeomin  or Botox  stops being effective for the hemifacial spasms, we could consider referral to neurosurgery for the vascular decompression 3.   Left subacromial bursa injection with 40 mg Depo-Medrol  in 2 cc Marcaine  using sterile technique.  He tolerated the procedure well and pain was much better afterwards.  There were no complications.  Rtc 3 months or sooner if new or worsening symptoms  Juniel Groene A. Godwin Lat, MD, [facility] 11/08/2023, 12:53 PM Certified in Neurology, Clinical Neurophysiology, Sleep Medicine, Pain Medicine and Neuroimaging  [facility] [address] [place], [place] [address] [phone]

## 2023-11-08 NOTE — Progress Notes (Signed)
 ENT Progress Note   Update 11/08/23  Danny Brewer is a 53 year old male who presents with burning jaw/post-auricular pain and muffled hearing.   He experiences a burning sensation in his jaw, particularly when chewing, with pain rated between 7 and 8 on a scale of 0 to 10. The pain is most intense at the start of eating and persists throughout the day. He manages the pain with Tylenol  and ibuprofen . He is cautious about taking other medications due to potential side effects, especially concerning his kidneys.  He reports intermittent muffled hearing on the left side, occurring throughout the day. No tinnitus or fluid drainage from the ear. He notes that his hearing is 'in and out sometimes.'  He has a history of teeth clenching at night, which may contribute to his symptoms. This history was noted by his dentist.  History of Present Illness Danny Brewer is a 53 year old male who presents for follow-up after surgery for pleomorphic adenoma, s/p after left superficial parotidectomy, final pathology c/w pleomorphic adenoma.   Fevers resolved, his pain is under better control and he stopped Oxycodone . No facial swelling, no drainage along the incision site. Still has some numbness around the left ear lobe.   PE BP 122/87   Pulse 91   SpO2 95%   Left pre-auricular incision c/d/I healed completely  No palpable fluctuance, incision without surrounding erythema and no drainage  B/l ear exam without middle ear effusion  CN 2-12 intact  Oropharynx w/o exudate or erythema   Pathology  A. PAROTID TAIL MASS, LEFT, EXCISION:  - Benign pleomorphic adenoma, 1.8 cm  - Resection margins are negative for tumor  - Benign lymph node  - No evidence of malignancy   A/P Encounter Diagnoses  Name Primary?   Decreased hearing of left ear Yes   Facial pain    Burning sensation of skin     His exam is reassuring but he has burning sensation in post-auricular area and along the left angle of mandible,  exacerbated by chewing.   - trial of Lyrica as needed and continue Tylenol  and Motrin   - will consider imaging and/or Botox  trial if sx persist and imaging is negative

## 2023-11-08 NOTE — Progress Notes (Addendum)
 Xeomin  100Ux 1vial- 70U total used Lot: [id number] Expiration: 11/2024 NDC: 409-8119-14  Bacteriostatic 0.9% Sodium Chloride - 4mL total used Lot: [id number] Expiration: 12/05/2024 NDC: 2130-8657-84  Sample medication   Methylprednisolone  80mg /mlx1 vial- 40mg  total used Lot: [id number], exp: 11/2024, NDC: [address]-2    Dr. Godwin Lat drew up own medication for both

## 2023-11-08 NOTE — Telephone Encounter (Signed)
 Botox  100u restart [id number], G51.32

## 2023-11-09 NOTE — Progress Notes (Unsigned)
      [address]       [place] [address]             [phone]        Jrake Rodriquez [facility] Medical Record #[id number] Date of Birth: 20-Nov-1970  Referring: Mordechai April, DO Primary Care: Mordechai April, DO Primary Cardiologist:Michael Arlester Ladd, MD  Reason for visit:   follow-up  History of Present Illness:     Danny Brewer present for ***  Physical Exam: There were no vitals taken for this visit.  Alert NAD Incision ***.  Sternum *** Abdomen, ND *** peripheral edema   Diagnostic Studies & Laboratory data: CXR:  ***     Assessment / Plan:   53 y.o. male s/p ***  Neuropathic pain following urgent sternotomy and CABG in 2020, unchanged.  As long as the patient does not lift any objects above his shoulders he is able to carry out the activities of daily living.  He is not on narcotics or tricyclic antidepressants.  He uses Tylenol  as needed.  Patient is disabled from work because of his  Neuropathic pain limiting his ability to lift objects above his shoulders. Plan: Return in 6 months for follow-up and reassessment.  Hilarie Lovely 11/09/2023 8:48 AM

## 2023-11-12 ENCOUNTER — Ambulatory Visit
Attending: Thoracic Surgery (Cardiothoracic Vascular Surgery) | Admitting: Thoracic Surgery (Cardiothoracic Vascular Surgery)

## 2023-11-12 ENCOUNTER — Encounter: Payer: Self-pay | Admitting: Thoracic Surgery (Cardiothoracic Vascular Surgery)

## 2023-11-12 VITALS — BP 134/93 | HR 101 | Resp 20 | Ht 69.0 in | Wt 181.1 lb

## 2023-11-12 DIAGNOSIS — Z951 Presence of aortocoronary bypass graft: Secondary | ICD-10-CM | POA: Diagnosis not present

## 2023-11-16 NOTE — Telephone Encounter (Signed)
 Submitted auth request via CMM to pt's primary [facility] plan, status is pending. Key: [id number]  Submitted additional auth request via CMM to pt's secondary [facility] community plan, status is pending. Key: [id number]

## 2023-11-17 NOTE — Telephone Encounter (Addendum)
 Insurance is denying Xeomin  stating that hemifacial spasm isn't a covered diagnosis. Ok to retry for Botox  100u instead?

## 2023-11-17 NOTE — Telephone Encounter (Signed)
 Resubmitted requests for Botox  100u.  [employer] MCD Key: [employer] Key: [id number]

## 2023-11-22 ENCOUNTER — Other Ambulatory Visit: Payer: Self-pay

## 2023-11-22 MED ORDER — ONABOTULINUMTOXINA 100 UNITS IJ SOLR
100.0000 [IU] | INTRAMUSCULAR | 3 refills | Status: AC
  Filled 2023-11-22 – 2024-04-06 (×2): qty 1, 90d supply, fill #0
  Filled 2024-06-28: qty 1, 90d supply, fill #1

## 2023-11-22 NOTE — Addendum Note (Signed)
 Addended by: Oneita Bihari on: 11/22/2023 08:17 AM   Modules accepted: Orders

## 2023-11-22 NOTE — Telephone Encounter (Addendum)
 Received approval from both insurances, please send rx for Botox  100u to [facility]. Pt is already scheduled with Dr. Godwin Lat for 04/11/24 (received samples at last visit, schedule 5 months out per MD).  [employer] auth#: [id number] (11/18/23-11/17/24) [employer] auth#: [id number] (11/17/23-11/16/24)

## 2023-12-08 ENCOUNTER — Encounter: Payer: Self-pay | Admitting: Physician Assistant

## 2023-12-08 ENCOUNTER — Ambulatory Visit: Attending: Cardiology | Admitting: Physician Assistant

## 2023-12-08 VITALS — BP 124/70 | HR 78 | Ht 69.0 in | Wt 178.6 lb

## 2023-12-08 DIAGNOSIS — I10 Essential (primary) hypertension: Secondary | ICD-10-CM | POA: Diagnosis not present

## 2023-12-08 DIAGNOSIS — I25810 Atherosclerosis of coronary artery bypass graft(s) without angina pectoris: Secondary | ICD-10-CM | POA: Diagnosis not present

## 2023-12-08 DIAGNOSIS — R42 Dizziness and giddiness: Secondary | ICD-10-CM | POA: Diagnosis not present

## 2023-12-08 DIAGNOSIS — R0989 Other specified symptoms and signs involving the circulatory and respiratory systems: Secondary | ICD-10-CM | POA: Diagnosis not present

## 2023-12-08 DIAGNOSIS — E119 Type 2 diabetes mellitus without complications: Secondary | ICD-10-CM

## 2023-12-08 DIAGNOSIS — E785 Hyperlipidemia, unspecified: Secondary | ICD-10-CM

## 2023-12-08 NOTE — Progress Notes (Signed)
 Cardiology Office Note   Date:  12/08/2023  ID:  Danny Brewer, DOB 1971-03-18, MRN [id number] PCP: Dayna Motto, DO  [facility] Providers Cardiologist:  Ozell Fell, MD Cardiology APP:  Lelon Glendia DASEN, PA-C     History of Present Illness Danny Brewer is a 53 y.o. male with past medical history of CAD s/p CABG 10/2018, HTN, HLD, DM II, carotid artery disease, ED and nephrolithiasis s/stone extraction July 2023.p echocardiogram prior to his bypass surgery in April 2020 showed EF 60 to 65%.  He has been intolerant of beta-blocker.  He is intolerant of statins and was started on Praluent .  Myoview  in October 2023 showed EF 48%, normal perfusion, low risk study.  Echocardiogram obtained on 03/25/2022 showed EF 60 to 65%, no regional wall motion abnormality, trivial MR, normal RV.  Patient was last seen by Dr. Fell in November 2020 for at which time he was doing well.  He did complain of episode of fast heartbeat.  Given solitary episode, without recurrence, it was recommended to continue monitoring and consider heart monitor if he has any recurrence.   Patient presents today for follow-up.  He denies any chest pain or shortness of breath.  He does have occasional sharp pain under his left rib when he take a deep breath, this appears to be musculoskeletal in nature and will go away after a few seconds.  He is fairly active.  He is overdue for fasting lipid panel and LFT.  He has been having some neck issue and has been dizzy every time he turns his head.  On physical exam, he had a right carotid bruit, however previous carotid ultrasound in October 2023 did not show significant carotid artery disease.  I will repeat a carotid ultrasound.  Otherwise, he has no lower extremity edema, orthopnea or PND.  He can follow-up in 6 months  ROS:   He denies chest pain, palpitations, dyspnea, pnd, orthopnea, n, v, dizziness, syncope, edema, weight gain, or early satiety. All other systems reviewed and  are otherwise negative except as noted above.    Studies Reviewed      Cardiac Studies & Procedures   ______________________________________________________________________________________________ CARDIAC CATHETERIZATION  CARDIAC CATHETERIZATION 10/14/2018  Conclusion  Diabetic with family history of premature coronary atherosclerosis.  Atypical and typical symptoms.  Recurring episodes of chest tightness at rest which is mimicked by left and right coronary contrast injections.  Symptoms in arm and neck are more continuous, positional, and not likely ischemic.  Severe two-vessel coronary disease with segmental 90 to 95% proximal to mid LAD, segmental 80% large first diagonal that supplies the distribution of the ramus intermedius or circumflex, segmental 80% stenosis in the moderate-sized second diagonal that is contained within the LAD stenosis as a bifurcation lesion, and segmental mid to distal.  The patient is right dominant.  Circumflex is small.  Normal LVEDP  RECOMMENDATIONS:   In room consultation with [facility] CV surgeon Dr. Dallas Jude.  After discussion with patient, his wife, and surgeon, I have decided to admit the patient and he will undergo arterial grafting to the LAD, diagonal, and probable saphenous vein grafting to the right coronary on Monday.  Decided against PCI because of the requirement for long stents in all segments and anticipated decreased durability over time given his young age and diabetes.  IV nitroglycerin , therapeutic Lovenox .  Screening COVID testing.  Sleep study as OP. Clinical history is strongly suggestive.  Findings Coronary Findings Diagnostic  Dominance: Right  Left Anterior Descending  Prox LAD to Mid LAD lesion is 95% stenosed.  First Diagonal Branch Ost 1st Diag to 1st Diag lesion is 80% stenosed.  Second Diagonal [employer] 2nd Diag to 2nd Diag lesion is 80% stenosed.  Left Circumflex Vessel is small.  First Obtuse Marginal  Branch Vessel is small in size.  Second Obtuse Marginal Branch Vessel is small in size.  Right Coronary Artery Mid RCA to Dist RCA lesion is 85% stenosed.  Intervention  No interventions have been documented.   STRESS TESTS  MYOCARDIAL PERFUSION IMAGING 03/20/2022  Narrative   The study is normal. The study is low risk.   No ST deviation was noted.   LV perfusion is normal. There is no evidence of ischemia. There is no evidence of infarction.   Left ventricular function is abnormal. Global function is mildly reduced. Nuclear stress EF: 48 %. The left ventricular ejection fraction is mildly decreased (45-54%). End diastolic cavity size is normal. End systolic cavity size is normal.   Prior study not available for comparison.  Normal perfusion Mild systolic dysfunction (EF 48%) with septal hypokinesis.  Consider echocardiogram for further evaluation Low risk study   ECHOCARDIOGRAM  ECHOCARDIOGRAM COMPLETE 03/25/2022  Narrative ECHOCARDIOGRAM REPORT    Patient Name:   Danny Brewer Date of Exam: 03/25/2022 Medical Rec #:  [id number]     Height:       69.0 in Accession #:    [id number]    Weight:       194.0 lb Date of Birth:  03-08-1971     BSA:          2.039 m Patient Age:    51 years      BP:           134/85 mmHg Patient Gender: M             HR:           92 bpm. Exam Location:  [address]  Procedure: 2D Echo, Cardiac Doppler and Color Doppler  Indications:    I50.9* Heart failure (unspecified)  History:        Patient has prior history of Echocardiogram examinations, most recent 10/17/2018. CAD, Prior CABG, Signs/Symptoms:Chest Pain; Risk Factors:Hypertension, Diabetes, Dyslipidemia and Sleep Apnea. Carotid artery disease. Precordial chet pain. Dizziness.  Sonographer:    Jon Hacker RCS Referring Phys: [id number] [name]  IMPRESSIONS   1. Left ventricular ejection fraction, by estimation, is 60 to 65%. The left ventricle has normal function. The  left ventricle has no regional wall motion abnormalities. Left ventricular diastolic parameters were normal. 2. Right ventricular systolic function is normal. The right ventricular size is normal. 3. The mitral valve is normal in structure. Trivial mitral valve regurgitation. 4. The aortic valve is tricuspid. Aortic valve regurgitation is not visualized.  FINDINGS Left Ventricle: Left ventricular ejection fraction, by estimation, is 60 to 65%. The left ventricle has normal function. The left ventricle has no regional wall motion abnormalities. The left ventricular internal cavity size was normal in size. There is no left ventricular hypertrophy. Left ventricular diastolic parameters were normal.  Right Ventricle: The right ventricular size is normal. Right vetricular wall thickness was not assessed. Right ventricular systolic function is normal.  Left Atrium: Left atrial size was normal in size.  Right Atrium: Right atrial size was normal in size.  Pericardium: There is no evidence of pericardial effusion.  Mitral Valve: The mitral valve is normal in structure. Trivial mitral valve regurgitation.  Tricuspid Valve:  The tricuspid valve is normal in structure. Tricuspid valve regurgitation is trivial.  Aortic Valve: The aortic valve is tricuspid. Aortic valve regurgitation is not visualized.  Pulmonic Valve: The pulmonic valve was normal in structure. Pulmonic valve regurgitation is mild.  Aorta: The aortic root and ascending aorta are structurally normal, with no evidence of dilitation.  IAS/Shunts: No atrial level shunt detected by color flow Doppler.   LEFT VENTRICLE PLAX 2D LVIDd:         3.60 cm   Diastology LVIDs:         2.40 cm   LV e' medial:    6.96 cm/s LV PW:         1.00 cm   LV E/e' medial:  8.1 LV IVS:        1.00 cm   LV e' lateral:   9.79 cm/s LVOT diam:     2.20 cm   LV E/e' lateral: 5.8 LV SV:         44 LV SV Index:   22 LVOT Area:     3.80 cm   RIGHT  VENTRICLE RV Basal diam:  3.30 cm RV S prime:     8.81 cm/s TAPSE (M-mode): 1.5 cm  LEFT ATRIUM             Index        RIGHT ATRIUM           Index LA diam:        3.10 cm 1.52 cm/m   RA Area:     12.60 cm LA Vol (A2C):   26.3 ml 12.90 ml/m  RA Volume:   30.40 ml  14.91 ml/m LA Vol (A4C):   13.0 ml 6.37 ml/m LA Biplane Vol: 19.6 ml 9.61 ml/m AORTIC VALVE LVOT Vmax:   75.80 cm/s LVOT Vmean:  47.600 cm/s LVOT VTI:    0.117 m  AORTA Ao Root diam: 3.60 cm Ao Asc diam:  3.40 cm  MITRAL VALVE MV Area (PHT): 4.06 cm    SHUNTS MV Decel Time: 187 msec    Systemic VTI:  0.12 m MV E velocity: 56.70 cm/s  Systemic Diam: 2.20 cm MV A velocity: 56.20 cm/s MV E/A ratio:  1.01  Vina Gull MD Electronically signed by Vina Gull MD Signature Date/Time: 03/25/2022/5:00:59 PM    Final   TEE  ECHO INTRAOPERATIVE TEE 10/17/2018  Narrative *INTRAOPERATIVE TRANSESOPHAGEAL REPORT *    Patient Name:   OREST DYGERT Date of Exam: 10/17/2018 Medical Rec #:  [id number]     Height:       69.0 in Accession #:    [id number]    Weight:       198.4 lb Date of Birth:  12/03/1970     BSA:          2.06 m Patient Age:    47 years      BP:           147/96 mmHg Patient Gender: M             HR:           106 bpm. Exam Location:  Inpatient  Transesophogeal exam was perform intraoperatively during surgical procedure. Patient was closely monitored under general anesthesia during the entirety of examination.  Indications:     Chest Pain Performing Phys: [id number] DALLAS NOVAK HZMYJMIU Diagnosing Phys: Elsie Needle MD  Complications: No known complications during this procedure. POST-OP IMPRESSIONS Overall, there were no significant changes from pre-bypass.  PRE-OP FINDINGS Left Ventricle: The left ventricle has normal systolic function, with an ejection fraction of 55-60%. The cavity size was normal. There is no increase in left ventricular wall thickness.  Right Ventricle: The right  ventricle has normal systolic function. The cavity was mildly enlarged. There is no increase in right ventricular wall thickness.  Left Atrium: Left atrial size was not assessed.  Right Atrium: Right atrial size was not assessed.  Interatrial Septum: No atrial level shunt detected by color flow Doppler.  Pericardium: There is no evidence of pericardial effusion.  Mitral Valve: The mitral valve is normal in structure. Mitral valve regurgitation is trivial by color flow Doppler.  Tricuspid Valve: The tricuspid valve was normal in structure. Tricuspid valve regurgitation was not visualized by color flow Doppler.  Aortic Valve: The aortic valve is normal in structure. Aortic valve regurgitation was not visualized by color flow Doppler.  Pulmonic Valve: The pulmonic valve was normal in structure. Pulmonic valve regurgitation is not visualized by color flow Doppler.    Elsie Needle MD Electronically signed by Elsie Needle MD Signature Date/Time: 10/18/2018/11:38:13 AM    Final    CT SCANS  CT CORONARY FRACTIONAL FLOW RESERVE DATA PREP 10/06/2018  Narrative CLINICAL DATA:  Chest pain  EXAM: CT FFR  MEDICATIONS: No additional medications.  TECHNIQUE: The coronary CTA was sent for FFR.  FINDINGS: FFR 0.61 mid D1  FFR 0.73 mid D2  FFR 0.74 mid LAD and <0.5 distal LAD  Unable to interpret RCA due to artifact.  No obstructive disease in LCx.  IMPRESSION: Hemodynamically significant stenosis in the proximal LAD and proximal D1 and D2. Unable to interpret RCA due to artifact. Suggest cardiac cath.  Dalton Mclean   Electronically Signed By: Ezra Shuck M.D. On: 10/07/2018 16:50   CT CORONARY MORPH W/CTA COR W/SCORE 10/06/2018  Addendum 10/06/2018  4:22 PM ADDENDUM REPORT: 10/06/2018 16:20  CLINICAL DATA:  Chest pain  EXAM: Cardiac CTA  MEDICATIONS: Sub lingual nitro. 4mg  x 2 and lopressor  mg  TECHNIQUE: The patient was scanned on a  Siemens 192 slice scanner. Gantry rotation speed was 250 msecs. Collimation was 0.6 mm. A 100 kV prospective scan was triggered in the ascending thoracic aorta at 35-75% of the R-R interval. Average HR during the scan was bpm. The 3D data set was interpreted on a dedicated work station using MPR, MIP and VRT modes. A total of 80cc of contrast was used.  FINDINGS: Non-cardiac: See separate report from [facility].  Pulmonary veins drain normally to the left atrium.  Calcium  Score: Coronary artery calcium  score 448 Agatston units.  Coronary Arteries: Right dominant with no anomalies  LM: No plaque or stenosis.  LAD system: Extensive mixed plaque with severe (70-90%) stenosis in the proximal LAD. D1 and D2 are small to moderate vessels and appear to have significant mixed plaque proximally (70-90% stenosis).  Circumflex system: Relatively small vessel, no plaque or stenosis.  RCA system: The mid RCA is obscured by motion artifact in all phases. However, there is mixed plaque in this area as well. Cannot rule out severe (70-90%) stenosis.  IMPRESSION: 1. Coronary artery calcium  score 448 Agatston units. This places the patient in the 98th percentile for age and gender, suggesting high risk of future cardiac events.  2. Suspected severe proximal LAD stenosis. There also appears to be significant disease in small-moderate D1 and D2.  3. Mid RCA is obscured by motion artifact but also has significant plaque. Possible severe (70-90%) stenosis.  Will send for FFR.  Dalton Mclean   Electronically Signed By: Ezra Shuck M.D. On: 10/06/2018 16:20  Narrative EXAM: OVER-READ INTERPRETATION  CT CHEST  The following report is an over-read performed by radiologist Dr. Marcey Moan of [facility] on 10/06/2018. This over-read does not include interpretation of cardiac or coronary anatomy or pathology. The coronary CTA interpretation by the cardiologist  is attached.  COMPARISON:  None.  FINDINGS: Vascular: No incidental noncardiac vascular findings.  Mediastinum/Nodes: Visualized mediastinum and hilar regions show no lymphadenopathy or masses.  Lungs/Pleura: Visualized lungs show no evidence of pulmonary edema, consolidation, pneumothorax, nodule or pleural fluid.  Upper Abdomen: No acute abnormality.  Musculoskeletal: No chest wall mass or suspicious bone lesions identified.  IMPRESSION: No significant incidental findings.  Electronically Signed: By: Marcey Moan M.D. On: 10/06/2018 10:06     ______________________________________________________________________________________________      Risk Assessment/Calculations           Physical Exam VS:  BP 124/70   Pulse 78   Ht 5' 9 (1.753 m)   Wt 178 lb 9.6 oz (81 kg)   SpO2 99%   BMI 26.37 kg/m        Wt Readings from Last 3 Encounters:  12/08/23 178 lb 9.6 oz (81 kg)  11/12/23 181 lb 1.6 oz (82.1 kg)  11/08/23 183 lb (83 kg)    GEN: Well nourished, well developed in no acute distress NECK: No JVD; No carotid bruits CARDIAC: RRR, no murmurs, rubs, gallops RESPIRATORY:  Clear to auscultation without rales, wheezing or rhonchi  ABDOMEN: Soft, non-tender, non-distended EXTREMITIES:  No edema; No deformity   ASSESSMENT AND PLAN  CAD s/p CABG in 2020: Occasional sharp pain in the left rib that is worse with deep inspiration.  His this is likely musculoskeletal in nature.  Continue observation.  On aspirin .  Due for fasting lipid panel and IFT.  Dizziness: He describes dizziness when he turns his head.  He has a right carotid bruit, will obtain carotid ultrasound  Right carotid bruit: Obtain carotid ultrasound  Hypertension: Blood pressure well-controlled  Hyperlipidemia: Not on statin therapy.  Will obtain fasting lipid panel and liver function test.  DM2: On Jardiance        Dispo: Follow-up in 38-month  Signed, Coltyn Hanning, [place]

## 2023-12-08 NOTE — Patient Instructions (Signed)
 Medication Instructions:  NO CHANGES *If you need a refill on your cardiac medications before your next appointment, please call your pharmacy*  Lab Work: FASTING LIPID PANEL AND LFT TODAY If you have labs (blood work) drawn today and your tests are completely normal, you will receive your results only by: MyChart Message (if you have MyChart) OR A paper copy in the mail If you have any lab test that is abnormal or we need to change your treatment, we will call you to review the results.  Testing/Procedures:[address] Your physician has requested that you have a carotid duplex. This test is an ultrasound of the carotid arteries in your neck. It looks at blood flow through these arteries that supply the brain with blood. Allow one hour for this exam. There are no restrictions or special instructions.   Follow-Up: At [facility], you and your health needs are our priority.  As part of our continuing mission to provide you with exceptional heart care, our providers are all part of one team.  This team includes your primary Cardiologist (physician) and Advanced Practice Providers or APPs (Physician Assistants and Nurse Practitioners) who all work together to provide you with the care you need, when you need it.  Your next appointment:   6 month(s)  Provider:   Ozell Fell, MD

## 2023-12-09 ENCOUNTER — Other Ambulatory Visit: Payer: Self-pay | Admitting: Physician Assistant

## 2023-12-09 ENCOUNTER — Ambulatory Visit: Payer: Self-pay | Admitting: Physician Assistant

## 2023-12-09 DIAGNOSIS — E785 Hyperlipidemia, unspecified: Secondary | ICD-10-CM

## 2023-12-09 LAB — HEPATIC FUNCTION PANEL
ALT: 17 IU/L (ref 0–44)
AST: 16 IU/L (ref 0–40)
Albumin: 4.4 g/dL (ref 3.8–4.9)
Alkaline Phosphatase: 68 IU/L (ref 44–121)
Bilirubin Total: 0.4 mg/dL (ref 0.0–1.2)
Bilirubin, Direct: 0.11 mg/dL (ref 0.00–0.40)
Total Protein: 6.9 g/dL (ref 6.0–8.5)

## 2023-12-09 LAB — LIPID PANEL
Chol/HDL Ratio: 3.6 ratio (ref 0.0–5.0)
Cholesterol, Total: 213 mg/dL — ABNORMAL HIGH (ref 100–199)
HDL: 60 mg/dL (ref >39)
LDL Chol Calc (NIH): 136 mg/dL — ABNORMAL HIGH (ref 0–99)
Triglycerides: 93 mg/dL (ref 0–149)
VLDL Cholesterol Cal: 17 mg/dL (ref 5–40)

## 2023-12-09 MED ORDER — REPATHA SURECLICK 140 MG/ML ~~LOC~~ SOAJ: 140.0000 mg | SUBCUTANEOUS | 3 refills | Status: DC

## 2023-12-13 ENCOUNTER — Other Ambulatory Visit (HOSPITAL_COMMUNITY): Payer: Self-pay

## 2023-12-13 ENCOUNTER — Telehealth: Payer: Self-pay | Admitting: Pharmacy Technician

## 2023-12-13 NOTE — Telephone Encounter (Signed)
 Pharmacy Patient Advocate Encounter   Received notification from Onbase that prior authorization for repatha  is required/requested.   Insurance verification completed.   The patient is insured through [facility] .   Per test claim: PA required; PA submitted to above mentioned insurance via CoverMyMeds Key/confirmation #/EOC [id number] Status is pending    Pharmacy Patient Advocate Encounter  Received notification from [facility] that Prior Authorization for repatha  has been APPROVED from 12/13/23 to 12/12/24  Sent mychart

## 2023-12-22 ENCOUNTER — Ambulatory Visit (HOSPITAL_COMMUNITY)
Admission: RE | Admit: 2023-12-22 | Discharge: 2023-12-22 | Disposition: A | Discharge status: Home / Self Care | Source: Ambulatory Visit | Attending: Physician Assistant | Admitting: Physician Assistant

## 2023-12-22 DIAGNOSIS — R42 Dizziness and giddiness: Secondary | ICD-10-CM | POA: Insufficient documentation

## 2023-12-22 DIAGNOSIS — R0989 Other specified symptoms and signs involving the circulatory and respiratory systems: Secondary | ICD-10-CM | POA: Diagnosis present

## 2023-12-24 ENCOUNTER — Other Ambulatory Visit: Payer: Self-pay

## 2023-12-24 ENCOUNTER — Telehealth: Payer: Self-pay | Admitting: Cardiovascular Disease

## 2023-12-24 NOTE — Telephone Encounter (Signed)
 Patient called to follow-up on his vascular test results.

## 2023-12-24 NOTE — Telephone Encounter (Signed)
 Spoke with pt and relayed results of Carotid US . Pt verbalized understanding. All questions if any were answered.

## 2023-12-24 NOTE — Telephone Encounter (Signed)
 Patient called to follow-up on getting his prescription for Evolocumab  (REPATHA  SURECLICK) 140 MG/ML SOAJ  sent to his pharmacy.  Patient noted he only has 1 shot left.

## 2024-01-10 ENCOUNTER — Ambulatory Visit (INDEPENDENT_AMBULATORY_CARE_PROVIDER_SITE_OTHER): Admitting: Otolaryngology

## 2024-01-10 ENCOUNTER — Ambulatory Visit (INDEPENDENT_AMBULATORY_CARE_PROVIDER_SITE_OTHER): Admitting: Audiology

## 2024-02-14 ENCOUNTER — Ambulatory Visit (INDEPENDENT_AMBULATORY_CARE_PROVIDER_SITE_OTHER): Admitting: Otolaryngology

## 2024-02-14 ENCOUNTER — Encounter (INDEPENDENT_AMBULATORY_CARE_PROVIDER_SITE_OTHER): Payer: Self-pay | Admitting: Otolaryngology

## 2024-02-14 VITALS — BP 126/86 | HR 103

## 2024-02-14 DIAGNOSIS — G8929 Other chronic pain: Secondary | ICD-10-CM

## 2024-02-14 DIAGNOSIS — G501 Atypical facial pain: Secondary | ICD-10-CM

## 2024-02-14 DIAGNOSIS — R6884 Jaw pain: Secondary | ICD-10-CM | POA: Diagnosis not present

## 2024-02-14 DIAGNOSIS — G508 Other disorders of trigeminal nerve: Secondary | ICD-10-CM

## 2024-02-14 DIAGNOSIS — D110 Benign neoplasm of parotid gland: Secondary | ICD-10-CM

## 2024-02-14 MED ORDER — AMITRIPTYLINE HCL 50 MG PO TABS: 50.0000 mg | ORAL_TABLET | Freq: Every day | ORAL | 1 refills | Status: DC

## 2024-02-14 NOTE — Progress Notes (Signed)
 ENT Progress Note   Discussed the use of AI scribe software for clinical note transcription with the patient, who gave verbal consent to proceed.  History of Present Illness Danny Brewer is a 53 year old male who presents with facial pain and numbness following surgery and dull burning ache along the left face.   He experiences persistent facial pain and numbness following a surgical procedure. The pain is described as a burning sensation internally on the side of his face, particularly when chewing or opening his mouth, and has worsened over the past few months. Numbness is more pronounced in the mornings, extending up to his eye area, making it difficult to feel anything.  He has Gabapentin  allergy and did not take Lyrica  prescription that we gave him. His wife mentioned a possible allergy to Lyrica , but this is not documented. He is currently on a muscle spasm medication, which has not alleviated his symptoms.  Update 11/08/23  Danny Brewer is a 53 year old male who presents with burning jaw/post-auricular pain and muffled hearing.   He experiences a burning sensation in his jaw, particularly when chewing, with pain rated between 7 and 8 on a scale of 0 to 10. The pain is most intense at the start of eating and persists throughout the day. He manages the pain with Tylenol  and ibuprofen . He is cautious about taking other medications due to potential side effects, especially concerning his kidneys.  He reports intermittent muffled hearing on the left side, occurring throughout the day. No tinnitus or fluid drainage from the ear. He notes that his hearing is 'in and out sometimes.'  He has a history of teeth clenching at night, which may contribute to his symptoms. This history was noted by his dentist.  History of Present Illness Danny Brewer is a 53 year old male who presents for follow-up after surgery for pleomorphic adenoma, s/p after left superficial parotidectomy, final pathology c/w  pleomorphic adenoma.   Fevers resolved, his pain is under better control and he stopped Oxycodone . No facial swelling, no drainage along the incision site. Still has some numbness around the left ear lobe.   PE BP 126/86   Pulse (!) 103   SpO2 96%   Left pre-auricular incision c/d/I healed completely  No palpable fluctuance, incision without surrounding erythema and no drainage  B/l ear exam without middle ear effusion  CN 2-12 intact  Oropharynx w/o exudate or erythema   Pathology  A. PAROTID TAIL MASS, LEFT, EXCISION:  - Benign pleomorphic adenoma, 1.8 cm  - Resection margins are negative for tumor  - Benign lymph node  - No evidence of malignancy   A/P Encounter Diagnoses  Name Primary?   Parotid pleomorphic adenoma    Jaw pain    Chronic facial pain    Atypical face pain    Trigemino-sympathetic syndrome Yes     His exam is reassuring but he has burning sensation in post-auricular area and along the left angle of mandible, exacerbated by chewing.   - trial of Lyrica  as needed and continue Tylenol  and Motrin   - will consider imaging and/or Botox  trial if sx persist and imaging is negative  Update 02/14/24 Assessment and Plan Assessment & Plan Chronic facial neuropathic pain  Chronic facial and jaw pain with numbness likely due to nerve regeneration post-surgery vs trigemino-sympathetic sy .  - Start low dose amitriptyline  at night. Increase to two tablets if ineffective. - Consider referral to oral surgeon for TMJ disorder evaluation. - Schedule  Botox  injections pending insurance approval to help with chronic pain . - Follow-up in four weeks to assess treatment efficacy and proceed with Botox  if approved.

## 2024-02-25 ENCOUNTER — Other Ambulatory Visit: Payer: Self-pay

## 2024-03-09 ENCOUNTER — Telehealth: Payer: Self-pay | Admitting: Cardiovascular Disease

## 2024-03-09 NOTE — Telephone Encounter (Signed)
 Pt states wife has tested positive for Hepatitis B. He would like to speak with a nurse in regards to this. He is not sure if he is positive as well, also would like to know if this could affect his heart. Please advise.

## 2024-03-09 NOTE — Telephone Encounter (Signed)
 Spoke with pt. Pt stated he had affair on his wife and she is now testing positive for hepatitis B. He stated he wanted to be tested as well. Advised pt to go to his PCP for that test. Pt stated understanding and thanks.

## 2024-03-12 ENCOUNTER — Other Ambulatory Visit (INDEPENDENT_AMBULATORY_CARE_PROVIDER_SITE_OTHER): Payer: Self-pay | Admitting: Otolaryngology

## 2024-03-16 ENCOUNTER — Encounter (INDEPENDENT_AMBULATORY_CARE_PROVIDER_SITE_OTHER): Payer: Self-pay | Admitting: Otolaryngology

## 2024-03-16 ENCOUNTER — Ambulatory Visit (INDEPENDENT_AMBULATORY_CARE_PROVIDER_SITE_OTHER): Admitting: Otolaryngology

## 2024-03-16 VITALS — BP 131/85 | HR 84 | Temp 98.6°F

## 2024-03-16 DIAGNOSIS — D110 Benign neoplasm of parotid gland: Secondary | ICD-10-CM

## 2024-03-16 DIAGNOSIS — G508 Other disorders of trigeminal nerve: Secondary | ICD-10-CM

## 2024-03-16 DIAGNOSIS — F458 Other somatoform disorders: Secondary | ICD-10-CM

## 2024-03-16 DIAGNOSIS — R6884 Jaw pain: Secondary | ICD-10-CM | POA: Diagnosis not present

## 2024-03-16 DIAGNOSIS — G501 Atypical facial pain: Secondary | ICD-10-CM

## 2024-03-16 DIAGNOSIS — M542 Cervicalgia: Secondary | ICD-10-CM

## 2024-03-16 DIAGNOSIS — R519 Headache, unspecified: Secondary | ICD-10-CM

## 2024-03-16 NOTE — Progress Notes (Signed)
 ENT Progress Note:   Update 03/16/2024  Discussed the use of AI scribe software for clinical note transcription with the patient, who gave verbal consent to proceed.  History of Present Illness Danny Brewer is a 53 year old male who presents with persistent jaw and back pain.  He experiences persistent pain primarily located in the jaw area, extending through the jaw, possibly related to clenching his teeth at night. He has been provided with a mouthpiece by his dentist to help with the clenching, but continues to experience significant pain.  He also has back pain. He has been taking amitriptyline , which has provided some relief, but the pain persists. He recently received a refill for this medication.  Records Reviewed:  Initial Evaluation  Discussed the use of AI scribe software for clinical note transcription with the patient, who gave verbal consent to proceed.  History of Present Illness Danny Brewer is a 53 year old male who presents with facial pain and numbness following surgery and dull burning ache along the left face.   He experiences persistent facial pain and numbness following a surgical procedure. The pain is described as a burning sensation internally on the side of his face, particularly when chewing or opening his mouth, and has worsened over the past few months. Numbness is more pronounced in the mornings, extending up to his eye area, making it difficult to feel anything.  He has Gabapentin  allergy and did not take Lyrica  prescription that we gave him. His wife mentioned a possible allergy to Lyrica , but this is not documented. He is currently on a muscle spasm medication, which has not alleviated his symptoms.  Update 11/08/23  Danny Brewer is a 53 year old male who presents with burning jaw/post-auricular pain and muffled hearing.   He experiences a burning sensation in his jaw, particularly when chewing, with pain rated between 7 and 8 on a scale of 0 to 10. The  pain is most intense at the start of eating and persists throughout the day. He manages the pain with Tylenol  and ibuprofen . He is cautious about taking other medications due to potential side effects, especially concerning his kidneys.  He reports intermittent muffled hearing on the left side, occurring throughout the day. No tinnitus or fluid drainage from the ear. He notes that his hearing is 'in and out sometimes.'  He has a history of teeth clenching at night, which may contribute to his symptoms. This history was noted by his dentist.  History of Present Illness  PE BP 131/85   Pulse 84   Temp 98.6 F (37 C)   SpO2 97%   Left pre-auricular incision c/d/I healed completely  No palpable fluctuance, incision without surrounding erythema and no drainage  B/l ear exam without middle ear effusion  CN 2-12 intact  Oropharynx w/o exudate or erythema   Pathology  A. PAROTID TAIL MASS, LEFT, EXCISION:  - Benign pleomorphic adenoma, 1.8 cm  - Resection margins are negative for tumor  - Benign lymph node  - No evidence of malignancy   PROCEDURE  Facial Botox  injection for chronic pain   NDC 9976-8854-98  ATTENDING: Elena Brewer, M.D.  PREOPERATIVE DIAGNOSIS(ES): atypical facial pain, trigemino-sympathetic syndrome, jaw pain  POSTOPERATIVE DIAGNOSIS(ES): Same  PROCEDURE PERFORMED: Botox  injection into the area of the facial muscles in the region of left parotid gland and along masseter  INDICATIONS FOR PROCEDURE: The patient presents for scheduled Botox  injection. The risks and benefits of the surgical procedure have been explained in  detail to the patient and he has elected to proceed.  CONSENT: Informed consent was obtained prior to the procedure after discussion of risks, benefits, and alternatives and expected outcomes were discussed with the patient; consent placed in chart. The possibilities of reaction to medication, pulmonary aspiration, bleeding, infection, the need  for additional procedures, failure to diagnose a condition, dyspnea, and creating a complication requiring transfusion or operation were discussed with the patient. The patient concurred with the proposed plan, giving informed consent.  UNIVERSAL PROTOCOL/ TIMEOUT: Preprocedure verification is complete patient verified and consents confirmed, procedure sites are identified and marked, timeout was called before the start of the procedure.  H&P REVIEW: The patient's history and physical were reviewed today prior to procedure. All medications were reviewed and updated as well.  ANESTHESIA: local anesthesia   PROCEDURE DETAILS: The patient was in a seated position. The anterior neck skin was then cleansed with alcohol and using 25 gauge needle we performed multiple injections along the left parotid area, masseter area and upper sternocleidomastoid attachment at the mastoid process. A total of 100 units of Botox  was used aside from the small amount that was lost while aspirating and priming the needle. A total of 100 units was used.  A/P Encounter Diagnoses  Name Primary?   Parotid pleomorphic adenoma Yes   Jaw pain    Trigemino-sympathetic syndrome    Atypical face pain    Facial pain      His exam is reassuring but he has burning sensation in post-auricular area and along the left angle of mandible, exacerbated by chewing.   - trial of Lyrica  as needed and continue Tylenol  and Motrin   - will consider imaging and/or Botox  trial if sx persist and imaging is negative  Update 02/14/24   Danny Brewer is a 53 year old male who presents with persistent jaw and back pain.  He experiences persistent pain primarily located in the jaw area, extending through the jaw, possibly related to clenching his teeth at night. He has been provided with a mouthpiece by his dentist to help with the clenching, but continues to experience significant pain.  He also has back pain. He has been taking  amitriptyline , which has provided some relief, but the pain persists. He recently received a refill for this medication.  He mentions noticing a lot of wax buildup in his ear. Assessment and Plan Assessment & Plan Chronic facial neuropathic pain  Chronic facial and jaw pain with numbness likely due to nerve regeneration post-surgery vs trigemino-sympathetic sy .  - Start low dose amitriptyline  at night. Increase to two tablets if ineffective. - Consider referral to oral surgeon for TMJ disorder evaluation. - Schedule Botox  injections pending insurance approval to help with chronic pain . - Follow-up in four weeks to assess treatment efficacy and proceed with Botox  if approved.  Update 03/16/24 Chronic facial and jaw pain could be associated with bruxism He had significant pain in the area prior to parotid surgery and we discussed that it is not common for benign parotid tumors.  Chronic facial and jaw pain likely due to bruxism. Previous dental evaluation ruled out dental issues. Amitriptyline  provided some relief. S/p Botox  injection today along left masseter, parotid. If no relief we will try to focus primarily at the [facility] attachment near mastoid tip and along spinal musculature next time  - Continue amitriptyline  as tolerated. - Monitor pain levels and Botox  effectiveness over next few weeks. - Consider increasing Botox  dose or targeting different muscles if  no improvement. - Schedule follow-up in 2.5 to 3 months, around December 15th for repeat injection

## 2024-03-16 NOTE — Progress Notes (Signed)
 Patient has not taken BP meds yet.

## 2024-04-03 ENCOUNTER — Telehealth (INDEPENDENT_AMBULATORY_CARE_PROVIDER_SITE_OTHER): Payer: Self-pay

## 2024-04-03 NOTE — Telephone Encounter (Signed)
 Spoke with pt, pt states he will c/b to r/s

## 2024-04-06 ENCOUNTER — Other Ambulatory Visit (HOSPITAL_COMMUNITY): Payer: Self-pay

## 2024-04-06 ENCOUNTER — Other Ambulatory Visit: Payer: Self-pay | Admitting: Pharmacy Technician

## 2024-04-06 ENCOUNTER — Other Ambulatory Visit: Payer: Self-pay

## 2024-04-06 NOTE — Progress Notes (Signed)
 Specialty Pharmacy Initial Fill Coordination Note  Danny Brewer is a 53 y.o. male contacted today regarding initial fill of specialty medication(s) OnabotulinumtoxinA  (BOTOX )   Patient requested Courier to Provider Office   Delivery date: 04/10/24   Verified address: [facility]  [address] [place], [place] [address]   Medication will be filled on 10.31.25.   Patient is aware of 0 copayment.

## 2024-04-06 NOTE — Progress Notes (Signed)
 Initial fill has been completed in [place].

## 2024-04-11 ENCOUNTER — Ambulatory Visit: Admitting: Neurology

## 2024-04-11 ENCOUNTER — Encounter: Payer: Self-pay | Admitting: Neurology

## 2024-04-25 ENCOUNTER — Telehealth (INDEPENDENT_AMBULATORY_CARE_PROVIDER_SITE_OTHER): Payer: Self-pay | Admitting: Otolaryngology

## 2024-04-25 ENCOUNTER — Ambulatory Visit (INDEPENDENT_AMBULATORY_CARE_PROVIDER_SITE_OTHER): Admitting: Otolaryngology

## 2024-04-25 ENCOUNTER — Encounter (INDEPENDENT_AMBULATORY_CARE_PROVIDER_SITE_OTHER): Payer: Self-pay | Admitting: Otolaryngology

## 2024-04-25 VITALS — BP 128/86 | HR 94 | Temp 98.5°F

## 2024-04-25 DIAGNOSIS — H903 Sensorineural hearing loss, bilateral: Secondary | ICD-10-CM

## 2024-04-25 DIAGNOSIS — H9192 Unspecified hearing loss, left ear: Secondary | ICD-10-CM | POA: Diagnosis not present

## 2024-04-25 DIAGNOSIS — R200 Anesthesia of skin: Secondary | ICD-10-CM

## 2024-04-25 NOTE — Telephone Encounter (Signed)
 Patient mentioned at check out that he forgot to tell you that he is always biting the left side of his tongue because he does not have feeling on that side of his face.  Call him you have any questions.

## 2024-04-25 NOTE — Progress Notes (Signed)
 Reason for Consult: Left hearing loss Referring Physician: Dr. [name] is an 53 y.o. male.  HPI: History of left parotidectomy about 8 months ago.  It was a benign pleomorphic adenoma.  He has had numbness of the left side of his face ever since and some pain.  He is still having the numbness over the left neck and left ear.  He is hearing loss he says he has been over several months.  It feels like a fullness.  No drainage from his ear.  No vertigo.  No tinnitus.  Past Medical History:  Diagnosis Date   BPH (benign prostatic hyperplasia)    Coronary Artery Disease    cardiologist--- dr wonda;    Coronary CTA 09/2018: pLAD 70-90; pD1+pD2 70-90; mRCA mixed plaque - cannot rule out 70-90; FFR suggests hemodynamically significant stenosis // LHC 10/2018: 2v CAD >> s/p  CABG (L-LAD, L radial-RI, S-D2, S-RCA) with Dr. 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   LIMA--LAD;  SVG--D2;  SVG--dRCA;  left radial --- RI   Type 2 diabetes mellitus (HCC)    followed by pcp   (08-05-2022 checks blood sugar daily am fasting,  fasting sugar-- 77-100)   Umbilical hernia    Wears glasses     Past Surgical History:  Procedure Laterality Date   CORONARY ARTERY BYPASS GRAFT N/A 10/17/2018   Procedure: CORONARY ARTERY BYPASS GRAFTING (CABG), ON PUMP, TIMES FOUR, USING LIMA TO LAD, ENDOSCOPICALLY HARVESTED GREATER SAPHENOUS VEIN TO DIAG 2, SVG TO DISTAL RCA, AND LEFT RADIAL TO RAMUS INTERMEDIUS;  Surgeon: Army Dallas NOVAK, MD;  Location: [facility] OR;  Service: Open Heart Surgery;  Laterality: N/A;   CYSTOSCOPY WITH RETROGRADE PYELOGRAM, URETEROSCOPY AND STENT PLACEMENT Right 12/30/2021   Procedure: CYSTOSCOPY WITH RETROGRADE PYELOGRAM,  URETEROSCOPY AND STENT PLACEMENT, BASKET OF STONES;  Surgeon: Alvaro Hummer, MD;  Location: [facility];  Service: Urology;  Laterality: Right;   CYSTOSCOPY WITH RETROGRADE PYELOGRAM, URETEROSCOPY AND STENT PLACEMENT Left 08/07/2022   Procedure: CYSTOSCOPY WITH RETROGRADE PYELOGRAM, URETEROSCOPY AND STENT PLACEMENT;  Surgeon: Alvaro Hummer, MD;  Location: [facility];  Service: Urology;  Laterality: Left;  1 HR   ENDOVEIN HARVEST OF GREATER SAPHENOUS VEIN Right 10/17/2018   Procedure: Jethro Rubins Of Greater Saphenous Vein;  Surgeon: Army Dallas NOVAK, MD;  Location: [facility] OR;  Service: Open Heart Surgery;  Laterality: Right;   HAND SURGERY Left 1987   complex laceration   HOLMIUM LASER APPLICATION Left 08/07/2022   Procedure: HOLMIUM LASER APPLICATION;  Surgeon: Alvaro Hummer, MD;  Location: [facility];  Service: Urology;  Laterality: Left;   INGUINAL HERNIA REPAIR Bilateral 05/27/2015   Procedure: LAPAROSCOPIC BILATERAL  INGUINAL HERNIA REPAIRS WITH MESH;  Surgeon: Elspeth Schultze, MD;  Location: [facility];  Service: General;  Laterality: Bilateral;   KNEE ARTHROSCOPY Left 1991   LEFT HEART CATH AND CORONARY ANGIOGRAPHY N/A 10/14/2018   Procedure: LEFT HEART CATH AND CORONARY ANGIOGRAPHY;  Surgeon: Claudene Victory ORN, MD;  Location: [facility];  Service: Cardiovascular;  Laterality: N/A;   PAROTIDECTOMY Left 09/01/2023   Procedure: EXCISION, PAROTID GLAND;  Surgeon: Okey Burns,  MD;  Location: [facility] OR;  Service: ENT;  Laterality: Left;   PENILE PROSTHESIS IMPLANT N/A 10/03/2020   Procedure: PLACEMENT OF PENILE PROTHESIS INFLATABLE;  Surgeon: Matilda Senior, MD;  Location: [facility];  Service: Urology;  Laterality: N/A;  pubis area   RADIAL ARTERY HARVEST Left 10/17/2018   Procedure: RADIAL ARTERY HARVEST;  Surgeon: Army Dallas NOVAK, MD;  Location: [facility] OR;  Service: Open Heart Surgery;  Laterality: Left;   REMOVAL OF PENILE PROSTHESIS N/A 07/07/2021   Procedure: EXPLANT  AND  REPLACEMENT OF COLOPLAST TITAN PENILE PROSTHESIS;  Surgeon: Matilda Senior, MD;  Location: [facility];  Service: Urology;  Laterality: N/A;   SCROTAL EXPLORATION N/A 07/07/2021   Procedure: EXPLORATION OF PENILE PROSTHESIS;  Surgeon: Matilda Senior, MD;  Location: [facility];  Service: Urology;  Laterality: N/A;   SHOULDER ARTHROSCOPY Right 07/08/2022   @[facility]  by dr supple;   for impingment   SHOULDER CLOSED REDUCTION Left 09/04/2019   Procedure: CLOSED MANIPULATION SHOULDER;  Surgeon: Rubie Kemps, MD;  Location: [facility];  Service: Orthopedics;  Laterality: Left;   SHOULDER SURGERY Left 2022   TEE WITHOUT CARDIOVERSION N/A 10/17/2018   Procedure: TRANSESOPHAGEAL ECHOCARDIOGRAM (TEE);  Surgeon: Army Dallas NOVAK, MD;  Location: [facility] OR;  Service: Open Heart Surgery;  Laterality: N/A;    Family History  Problem Relation Age of Onset   Hypertension Mother    Cancer Father    Heart failure Father    Sudden death Brother    Heart attack Brother    Hypertension Sister    Heart murmur Sister     Social History:  reports that he has never smoked. He has never used smokeless tobacco. He reports that he does not drink alcohol and does not use drugs.  Allergies:  Allergies  Allergen Reactions   Antihistamines, Diphenhydramine -Type Hives and Other (See Comments)    Seizures   Gabapentin  Anaphylaxis   Bactrim  [Sulfamethoxazole -Trimethoprim ] Itching    Throat itching and suspected pill esophagitis   Demerol  Hives   Morphine  And Codeine Hives    Esp with high dose morphine , possibly Dilaudid  Tolerates hydrocodone , oxycodone    Compazine  [Prochlorperazine ] Other (See Comments)    Spastic movement   Metoprolol      Per patient caused severe headache   Morphine  Other (See Comments)   Oxycodone      Alters Personality   Statins Other (See Comments)    Myalgias on atorvastatin  20-80mg  daily and rosuvastatin  10mg  daily    Medications: I have reviewed the patient's current medications.  No  results found for this or any previous visit (from the past 48 hours).  No results found.  ROS Blood pressure 128/86, pulse 94, temperature 98.5 F (36.9 C), SpO2 97%. Physical Exam Constitutional:      Appearance: Normal appearance.  HENT:     Head: Normocephalic and atraumatic.     Right Ear: Tympanic membrane is without lesions and middle ear aerated, ear canal and external ear normal.     Left Ear: Tympanic membrane is without lesions and middle ear aerated, ear canal and external ear normal.     Nose: Nose normal. Turbinates with mild hypertrophy, No significant swelling or masses.     Oral cavity/oropharynx: Mucous membranes are moist. No lesions or masses    Larynx: normal voice. Mirror attempted without success    Eyes:     Extraocular Movements: Extraocular movements intact.     Conjunctiva/sclera: Conjunctivae normal.     Pupils: Pupils are equal, round, and reactive  to light.  Cardiovascular:     Rate and Rhythm: Normal rate.  Pulmonary:     Effort: Pulmonary effort is normal.  Musculoskeletal:     Cervical back: Normal range of motion and neck supple. No rigidity.  Lymphadenopathy:     Cervical: No cervical adenopathy or masses.salivary glands without lesions. .  Neurological:     Mental Status: He is alert. CN 2-12 intact. No nystagmus      Assessment/Plan: Left hearing loss-I do not see any pathology in the ear.  It is unlikely this is related to the numbness or the parotidectomy.  Will get an audiogram and see him back afterwards.  Left parotidectomy-this was a benign tumor.  He still numb and bothered by that.  I reassured him that it still has a chance of coming back but he might expect that there will be permanent numbness of a portion of his ear.  Norleen Notice 04/25/2024, 4:44 PM

## 2024-04-27 ENCOUNTER — Telehealth (INDEPENDENT_AMBULATORY_CARE_PROVIDER_SITE_OTHER): Payer: Self-pay | Admitting: Otolaryngology

## 2024-04-27 DIAGNOSIS — R200 Anesthesia of skin: Secondary | ICD-10-CM

## 2024-04-27 DIAGNOSIS — G501 Atypical facial pain: Secondary | ICD-10-CM

## 2024-04-27 DIAGNOSIS — H9192 Unspecified hearing loss, left ear: Secondary | ICD-10-CM

## 2024-04-27 NOTE — Telephone Encounter (Signed)
 Spoke with patient to schedule a hearing evaluation and he mentioned again that he would like a call back.  He is very concerned that the feeling in the left side of his face has not returned and he keeps biting his tongue.  He would like to know what can be done moving forward.  He can be reached at [phone].

## 2024-04-28 NOTE — Telephone Encounter (Signed)
 I talked to him about his numbness and he states that he feels his speech is different and he is biting his cheek. I think with the speech changes and the hearing loss that an MRI of head is indicated as this is outside the typical parotidectomy side effects.

## 2024-05-08 ENCOUNTER — Ambulatory Visit (HOSPITAL_COMMUNITY)
Admission: RE | Admit: 2024-05-08 | Discharge: 2024-05-08 | Disposition: A | Source: Ambulatory Visit | Attending: Otolaryngology | Admitting: Otolaryngology

## 2024-05-08 ENCOUNTER — Encounter (HOSPITAL_COMMUNITY): Payer: Self-pay

## 2024-05-08 DIAGNOSIS — G501 Atypical facial pain: Secondary | ICD-10-CM | POA: Insufficient documentation

## 2024-05-08 DIAGNOSIS — H9192 Unspecified hearing loss, left ear: Secondary | ICD-10-CM | POA: Diagnosis present

## 2024-05-08 DIAGNOSIS — R200 Anesthesia of skin: Secondary | ICD-10-CM | POA: Insufficient documentation

## 2024-05-08 MED ORDER — GADOBUTROL 1 MMOL/ML IV SOLN
8.0000 mL | Freq: Once | INTRAVENOUS | Status: AC | PRN
  Administered 2024-05-08: 8 mL via INTRAVENOUS

## 2024-05-10 ENCOUNTER — Telehealth (INDEPENDENT_AMBULATORY_CARE_PROVIDER_SITE_OTHER): Payer: Self-pay

## 2024-05-10 NOTE — Telephone Encounter (Signed)
 Error

## 2024-05-10 NOTE — Telephone Encounter (Signed)
 I returned a call to pt after he LVM to get a call back. He stated to me that he would like his MRI results, he has never been called to get a hearing test scheduled noted on 04/25/24, and he is still numb on one side, this is causing him to bite the inside of his jaw. Please call and advise about face and results. I sent a message to scheduling to call and get his H.T. scheduled and a note to Dr. Roark.I also called the Reading Room to see if they can read the MRI, they said they will get it in this afternoon.

## 2024-05-11 NOTE — Telephone Encounter (Signed)
 Called Danny Brewer to go over MRI. Dr. Roark instructed me to let Danny Brewer know everything looked normal there were no abnormalities. Danny Brewer asked about the numbness he is experiencing, Dr. Roark had previously explained to Danny Brewer that the numbness could take up to a year to go away I reiterated that it could take up to a year. I confirmed Danny Brewer's audio appointment on 05/22/2024, and stated that I would get our scheduling department to get him a follow up with Dr. Roark after his audio to go over it. Danny Brewer understood.

## 2024-05-18 ENCOUNTER — Ambulatory Visit (INDEPENDENT_AMBULATORY_CARE_PROVIDER_SITE_OTHER): Admitting: Otolaryngology

## 2024-05-22 ENCOUNTER — Ambulatory Visit (INDEPENDENT_AMBULATORY_CARE_PROVIDER_SITE_OTHER): Admitting: Audiology

## 2024-05-31 ENCOUNTER — Ambulatory Visit (INDEPENDENT_AMBULATORY_CARE_PROVIDER_SITE_OTHER)

## 2024-06-13 ENCOUNTER — Encounter (HOSPITAL_COMMUNITY): Payer: Self-pay

## 2024-06-13 ENCOUNTER — Emergency Department (HOSPITAL_COMMUNITY)
Admission: EM | Admit: 2024-06-13 | Discharge: 2024-06-13 | Disposition: A | Discharge status: Home / Self Care | Attending: Emergency Medicine | Admitting: Emergency Medicine

## 2024-06-13 ENCOUNTER — Emergency Department (HOSPITAL_COMMUNITY)

## 2024-06-13 DIAGNOSIS — R519 Headache, unspecified: Secondary | ICD-10-CM | POA: Diagnosis present

## 2024-06-13 DIAGNOSIS — Z7982 Long term (current) use of aspirin: Secondary | ICD-10-CM | POA: Diagnosis not present

## 2024-06-13 DIAGNOSIS — R42 Dizziness and giddiness: Secondary | ICD-10-CM | POA: Insufficient documentation

## 2024-06-13 LAB — URINALYSIS, ROUTINE W REFLEX MICROSCOPIC
Bacteria, UA: NONE SEEN
Bilirubin Urine: NEGATIVE
Glucose, UA: >=500 mg/dL — AB
Hgb urine dipstick: NEGATIVE
Ketones, ur: NEGATIVE mg/dL
Leukocytes,Ua: NEGATIVE
Nitrite: NEGATIVE
Protein, ur: NEGATIVE mg/dL
Specific Gravity, Urine: 1.026 (ref 1.005–1.030)
pH: 6 (ref 5.0–8.0)

## 2024-06-13 LAB — CBC WITH DIFFERENTIAL/PLATELET
Abs Immature Granulocytes: 0.02 K/uL (ref 0.00–0.07)
Basophils Absolute: 0.1 K/uL (ref 0.0–0.1)
Basophils Relative: 1 %
Eosinophils Absolute: 0.1 K/uL (ref 0.0–0.5)
Eosinophils Relative: 1 %
HCT: 47.6 % (ref 39.0–52.0)
Hemoglobin: 14.9 g/dL (ref 13.0–17.0)
Immature Granulocytes: 0 %
Lymphocytes Relative: 18 %
Lymphs Abs: 1.7 K/uL (ref 0.7–4.0)
MCH: 28.4 pg (ref 26.0–34.0)
MCHC: 31.3 g/dL (ref 30.0–36.0)
MCV: 90.8 fL (ref 80.0–100.0)
Monocytes Absolute: 0.6 K/uL (ref 0.1–1.0)
Monocytes Relative: 6 %
Neutro Abs: 7 K/uL (ref 1.7–7.7)
Neutrophils Relative %: 74 %
Platelets: 224 K/uL (ref 150–400)
RBC: 5.24 MIL/uL (ref 4.22–5.81)
RDW: 12.8 % (ref 11.5–15.5)
WBC: 9.4 K/uL (ref 4.0–10.5)
nRBC: 0 % (ref 0.0–0.2)

## 2024-06-13 LAB — COMPREHENSIVE METABOLIC PANEL WITH GFR
ALT: 15 U/L (ref 0–44)
AST: 25 U/L (ref 15–41)
Albumin: 3.9 g/dL (ref 3.5–5.0)
Alkaline Phosphatase: 70 U/L (ref 38–126)
Anion gap: 9 (ref 5–15)
BUN: 11 mg/dL (ref 6–20)
CO2: 23 mmol/L (ref 22–32)
Calcium: 8 mg/dL — ABNORMAL LOW (ref 8.9–10.3)
Chloride: 108 mmol/L (ref 98–111)
Creatinine, Ser: 0.95 mg/dL (ref 0.61–1.24)
GFR, Estimated: >60 mL/min
Glucose, Bld: 100 mg/dL — ABNORMAL HIGH (ref 70–99)
Potassium: 4.2 mmol/L (ref 3.5–5.1)
Sodium: 140 mmol/L (ref 135–145)
Total Bilirubin: 0.3 mg/dL (ref 0.0–1.2)
Total Protein: 6.9 g/dL (ref 6.5–8.1)

## 2024-06-13 MED ORDER — MECLIZINE HCL 25 MG PO TABS
25.0000 mg | ORAL_TABLET | Freq: Once | ORAL | Status: AC
  Administered 2024-06-13: 25 mg via ORAL
  Filled 2024-06-13: qty 1

## 2024-06-13 MED ORDER — DEXAMETHASONE SOD PHOSPHATE PF 10 MG/ML IJ SOLN
10.0000 mg | Freq: Once | INTRAMUSCULAR | Status: AC
  Administered 2024-06-13: 10 mg via INTRAVENOUS
  Filled 2024-06-13: qty 1

## 2024-06-13 MED ORDER — KETOROLAC TROMETHAMINE 15 MG/ML IJ SOLN
15.0000 mg | Freq: Once | INTRAMUSCULAR | Status: AC
  Administered 2024-06-13: 15 mg via INTRAVENOUS
  Filled 2024-06-13: qty 1

## 2024-06-13 MED ORDER — METOCLOPRAMIDE HCL 5 MG/ML IJ SOLN: 10.0000 mg | Freq: Once | INTRAMUSCULAR | Status: DC

## 2024-06-13 MED ORDER — LORAZEPAM 2 MG/ML IJ SOLN
1.0000 mg | Freq: Once | INTRAMUSCULAR | Status: AC
  Administered 2024-06-13: 1 mg via INTRAVENOUS
  Filled 2024-06-13: qty 1

## 2024-06-13 MED ORDER — SODIUM CHLORIDE 0.9 % IV BOLUS
1000.0000 mL | Freq: Once | INTRAVENOUS | Status: AC
  Administered 2024-06-13: 1000 mL via INTRAVENOUS

## 2024-06-13 NOTE — ED Provider Triage Note (Signed)
 Emergency Medicine Provider Triage Evaluation Note  Danny Brewer , a 54 y.o. male  was evaluated in triage.  Pt complains of headache with dizziness.  Patient reports this feels like his migraine and vertigo.  Last known well was 2100 yesterday.  Reports that he woke up with occipital headache and the dizziness is worsened with position changes.  Drove himself to urgent care who prompted him to the emergency department.  Denies any head injury or syncope.  Denies any chest pain or shortness of breath. Reports some blurry vision out of both eyes which is typical with his migraine presentation. Denies any fevers or sudden headache.   Review of Systems  Positive:  Negative:   Physical Exam  There were no vitals taken for this visit. Gen:   Awake, no distress   Resp:  Normal effort  MSK:   Moves extremities without difficulty  Other:  Answering questions appropriately with appropriate speech.  Patient on phone in no acute distress.  Cranial nerves grossly intact.  Strength is intact in the bilateral upper and lower extremities as well as sensation per patient.  Medical Decision Making  Medically screening exam initiated at 9:53 AM.  Appropriate orders placed.  Danny Brewer was informed that the remainder of the evaluation will be completed by another provider, this initial triage assessment does not replace that evaluation, and the importance of remaining in the ED until their evaluation is complete.  Will order meclizine  and basic labs.  Patient out of any stroke window.  Van negative.   Bernis Ernst, PA-C 06/13/24 1002

## 2024-06-13 NOTE — ED Notes (Signed)
 Light grren needs to be redrawn

## 2024-06-13 NOTE — ED Provider Notes (Signed)
 " [place] EMERGENCY DEPARTMENT AT [facility] Provider Note   CSN: [id number] Arrival date & time: 06/13/24  9064     Patient presents with: Migraine   Danny Brewer is a 54 y.o. male.  {Add pertinent medical, surgical, social history, OB history to HPI:32947} HPI ***    Prior to Admission medications  Medication Sig Start Date End Date Taking? Authorizing Provider  acetaminophen  (TYLENOL ) 500 MG tablet Take 1 tablet (500 mg total) by mouth every 6 (six) hours as needed. Please take every 6 hrs and stagger with Motrin  3 hrs apart from each other. Please do not exceed maximum daily dose to avoid liver damage 07/05/23   Okey Burns, MD  amitriptyline  (ELAVIL ) 50 MG tablet TAKE 1 TABLET BY MOUTH EVERYDAY AT BEDTIME 03/13/24   Okey Burns, MD  aspirin  81 MG tablet Take 1 tablet (81 mg total) by mouth daily. 10/11/18   Lelon Hamilton T, PA-C  botulinum toxin Type A  (BOTOX ) 100 units SOLR injection Inject 100 Units into the muscle every 3 (three) months. 11/22/23   Sater, Charlie LABOR, MD  empagliflozin  (JARDIANCE ) 25 MG TABS tablet Take 1 tablet (25 mg total) by mouth daily before breakfast. 08/25/23   Wonda Sharper, MD  Evolocumab  (REPATHA  SURECLICK) 140 MG/ML SOAJ Inject 140 mg into the skin every 14 (fourteen) days. 12/09/23   Meng, Hao, PA  ibuprofen  (ADVIL ) 600 MG tablet Take 1 tablet (600 mg total) by mouth every 6 (six) hours. Please take every 6 hrs, and stagger this medication 3 hrs apart from Tylenol  09/03/23   Soldatova, Liuba, MD  pantoprazole  (PROTONIX ) 40 MG tablet Take 40 mg by mouth daily.    [provider]  tamsulosin  (FLOMAX ) 0.4 MG CAPS capsule Take 0.4 mg by mouth daily as needed (for prostate).    [provider]    Allergies: Antihistamines, diphenhydramine -type; Gabapentin ; Bactrim  [sulfamethoxazole -trimethoprim ]; Demerol ; Morphine  and codeine; Compazine  [prochlorperazine ]; Metoprolol ; Morphine ; Oxycodone ; and Statins    Review of  Systems  Updated Vital Signs BP (!) 132/96 (BP Location: Right Arm)   Pulse 92   Temp (!) 97.1 F (36.2 C) (Temporal)   Resp 16   SpO2 99%   Physical Exam  (all labs ordered are listed, but only abnormal results are displayed) Labs Reviewed  URINALYSIS, ROUTINE W REFLEX MICROSCOPIC - Abnormal; Notable for the following components:      Result Value   Color, Urine STRAW (*)    Glucose, UA >=500 (*)    All other components within normal limits  COMPREHENSIVE METABOLIC PANEL WITH GFR - Abnormal; Notable for the following components:   Glucose, Bld 100 (*)    Calcium  8.0 (*)    All other components within normal limits  CBC WITH DIFFERENTIAL/PLATELET    EKG: EKG Interpretation Date/Time:  Tuesday June 13 2024 10:03:57 EST Ventricular Rate:  84 PR Interval:  140 QRS Duration:  102 QT Interval:  372 QTC Calculation: 439 R Axis:   70  Text Interpretation: Normal sinus rhythm Incomplete right bundle branch block Nonspecific T wave abnormality Abnormal ECG When compared with ECG of 08-Dec-2023 10:46, PREVIOUS ECG IS PRESENT Confirmed by Garrick Charleston [phone]) on 06/13/2024 10:00:34 PM  Radiology: MR BRAIN WO CONTRAST Result Date: 06/13/2024 EXAM: MRI Brain Without Contrast 06/13/2024 09:02:19 PM TECHNIQUE: Multiplanar multisequence MRI of the head/brain was performed without the administration of intravenous contrast. COMPARISON: MRI head 05/08/2024 CLINICAL HISTORY: Neuro deficit, acute, stroke suspected FINDINGS: BRAIN AND VENTRICLES: No acute infarct. No acute intracranial  hemorrhage. Small focus of susceptibility artifact in the left temporal lobe, compatible with prior prior microhemorrhage. No mass. No midline shift. No hydrocephalus. The sella is unremarkable. Normal flow voids. ORBITS: No acute abnormality. SINUSES AND MASTOIDS: No acute abnormality. BONES AND SOFT TISSUES: Normal marrow signal. No acute soft tissue abnormality. IMPRESSION: 1. No acute intracranial  abnormality. Electronically signed by: Gilmore Molt 06/13/2024 09:52 PM EST RP Workstation: [id number]    {Document cardiac monitor, telemetry assessment procedure when appropriate:32947} Procedures   Medications Ordered in the ED  metoCLOPramide  (REGLAN ) injection 10 mg (has no administration in time range)  meclizine  (ANTIVERT ) tablet 25 mg (25 mg Oral Given 06/13/24 1018)  sodium chloride  0.9 % bolus 1,000 mL (1,000 mLs Intravenous New Bag/Given 06/13/24 1923)  ketorolac  (TORADOL ) 15 MG/ML injection 15 mg (15 mg Intravenous Given 06/13/24 1918)  dexamethasone  (DECADRON ) injection 10 mg (10 mg Intravenous Given 06/13/24 1918)  LORazepam  (ATIVAN ) injection 1 mg (1 mg Intravenous Given 06/13/24 2010)      {Click here for ABCD2, HEART and other calculators REFRESH Note before signing:1}                              Medical Decision Making Amount and/or Complexity of Data Reviewed Radiology: ordered.  Risk Prescription drug management.   ***  {Document critical care time when appropriate  Document review of labs and clinical decision tools ie CHADS2VASC2, etc  Document your independent review of radiology images and any outside records  Document your discussion with family members, caretakers and with consultants  Document social determinants of health affecting pt's care  Document your decision making why or why not admission, treatments were needed:32947:::1}   Final diagnoses:  Bad headache  Dizziness    ED Discharge Orders     None        "

## 2024-06-13 NOTE — ED Notes (Signed)
 Patient transported to MRI

## 2024-06-13 NOTE — Discharge Instructions (Signed)
As discussed, your evaluation today has been largely reassuring.  But, it is important that you monitor your condition carefully, and do not hesitate to return to the ED if you develop new, or concerning changes in your condition. ° °Please follow-up with your physician for appropriate ongoing care. ° °

## 2024-06-13 NOTE — ED Triage Notes (Addendum)
 Pt BIB [facility] from [facility]. Pt woke up at 0400, started having dizziness blurred vision and headache. Drove himself to [facility]. Hx of migraines. Reports this feels like his migraines. Has previously done botox  to help with his migraines. Is due for another. Per [facility] stroke screen negative. Also c/o neck pain and fatigue.   CBG 123 136/92

## 2024-06-13 NOTE — ED Notes (Signed)
 Pt reports wife will be coming to transport him home.

## 2024-06-13 NOTE — ED Notes (Signed)
 Pt noted to be sleeping soundly and snoring.

## 2024-06-13 NOTE — Progress Notes (Unsigned)
 " Cardiology Office Note:    Date:  06/14/2024   ID:  Danny Brewer, DOB [birth year], MRN [id number]  PCP:  Danny Motto, DO   [facility] Providers Cardiologist:  Danny Fell, MD Cardiology APP:  Danny Brewer     Referring MD: Danny Motto, DO   Chief Complaint  Patient presents with   Coronary Artery Disease    History of Present Illness:    Danny Brewer is a 54 y.o. male with a hx of:  Coronary artery disease  s/p CABG 10/2018 Echocardiogram 09/19/18: EF 60-65 Intol of beta-blocker  Myoview  03/20/2022: EF 48, normal perfusion, low risk Echo 03/25/2022 EF 60-65, no RWMA, normal RVSF, trivial MR Diabetes mellitus 2 Hypertension  Hyperlipidemia  Intol of statins >> Rx w Alirocumab  (Praluent ) Carotid stenosis  US  10/21/20: Bilateral ICA 1-39 US  03/21/22: no ICA stenosis Bilat ED  Nephrolithiasis (s/p stone extraction in July 2023)   The patient is here alone today.  He is doing pretty well from a cardiac perspective.  He denies any exertional chest pain or pressure.  No dyspnea, edema, or heart palpitations.  He was in the emergency room earlier this week with a migraine headache and some other neurologic symptoms but everything was clear on his MRI with no acute abnormality seen.  He does have some problems with gastroesophageal reflux and is going to see his primary back soon for renewal of his proton pump inhibitor.  Current Medications: Active Medications[1]   Allergies:   Antihistamines, diphenhydramine -type; Gabapentin ; Bactrim  [sulfamethoxazole -trimethoprim ]; Demerol ; Morphine  and codeine; Compazine  [prochlorperazine ]; Metoprolol ; Morphine ; Oxycodone ; and Statins   ROS:   Please see the history of present illness.    All other systems reviewed and are negative.  EKGs/Labs/Other Studies Reviewed:    The following studies were reviewed today: Cardiac Studies & Procedures    ______________________________________________________________________________________________ CARDIAC CATHETERIZATION  CARDIAC CATHETERIZATION 10/14/2018  Conclusion  Diabetic with family history of premature coronary atherosclerosis.  Atypical and typical symptoms.  Recurring episodes of chest tightness at rest which is mimicked by left and right coronary contrast injections.  Symptoms in arm and neck are more continuous, positional, and not likely ischemic.  Severe two-vessel coronary disease with segmental 90 to 95% proximal to mid LAD, segmental 80% large first diagonal that supplies the distribution of the ramus intermedius or circumflex, segmental 80% stenosis in the moderate-sized second diagonal that is contained within the LAD stenosis as a bifurcation lesion, and segmental mid to distal.  The patient is right dominant.  Circumflex is small.  Normal LVEDP  RECOMMENDATIONS:   In room consultation with [facility] CV surgeon Dr. Dallas Jude.  After discussion with patient, his wife, and surgeon, I have decided to admit the patient and he will undergo arterial grafting to the LAD, diagonal, and probable saphenous vein grafting to the right coronary on Monday.  Decided against PCI because of the requirement for long stents in all segments and anticipated decreased durability over time given his young age and diabetes.  IV nitroglycerin , therapeutic Lovenox .  Screening COVID testing.  Sleep study as OP. Clinical history is strongly suggestive.  Findings Coronary Findings Diagnostic  Dominance: Right  Left Anterior Descending Prox LAD to Mid LAD lesion is 95% stenosed.  First Diagonal Branch Ost 1st Diag to 1st Diag lesion is 80% stenosed.  Second Diagonal [employer] 2nd Diag to 2nd Diag lesion is 80% stenosed.  Left Circumflex Vessel is small.  First Obtuse Marginal Branch Vessel is small in size.  Second Obtuse Marginal Branch Vessel is small in size.  Right  Coronary Artery Mid RCA to Dist RCA lesion is 85% stenosed.  Intervention  No interventions have been documented.   STRESS TESTS  MYOCARDIAL PERFUSION IMAGING 03/20/2022  Interpretation Summary   The study is normal. The study is low risk.   No ST deviation was noted.   LV perfusion is normal. There is no evidence of ischemia. There is no evidence of infarction.   Left ventricular function is abnormal. Global function is mildly reduced. Nuclear stress EF: 48 %. The left ventricular ejection fraction is mildly decreased (45-54%). End diastolic cavity size is normal. End systolic cavity size is normal.   Prior study not available for comparison.  Normal perfusion Mild systolic dysfunction (EF 48%) with septal hypokinesis.  Consider echocardiogram for further evaluation Low risk study   ECHOCARDIOGRAM  ECHOCARDIOGRAM COMPLETE 03/25/2022  Narrative ECHOCARDIOGRAM REPORT    Patient Name:   Danny Brewer Date of Exam: 03/25/2022 Medical Rec #:  [id number]     Height:       69.0 in Accession #:    [id number]    Weight:       194.0 lb Date of Birth:  [birth year]     BSA:          2.039 m Patient Age:    51 years      BP:           134/85 mmHg Patient Gender: M             HR:           92 bpm. Exam Location:  [address]  Procedure: 2D Echo, Cardiac Doppler and Color Doppler  Indications:    I50.9* Heart failure (unspecified)  History:        Patient has prior history of Echocardiogram examinations, most recent 10/17/2018. CAD, Prior CABG, Signs/Symptoms:Chest Pain; Risk Factors:Hypertension, Diabetes, Dyslipidemia and Sleep Apnea. Carotid artery disease. Precordial chet pain. Dizziness.  Sonographer:    Danny Brewer RCS Referring Phys: [id number] GLENDIA DASEN Brewer  IMPRESSIONS   1. Left ventricular ejection fraction, by estimation, is 60 to 65%. The left ventricle has normal function. The left ventricle has no regional wall motion abnormalities. Left ventricular diastolic  parameters were normal. 2. Right ventricular systolic function is normal. The right ventricular size is normal. 3. The mitral valve is normal in structure. Trivial mitral valve regurgitation. 4. The aortic valve is tricuspid. Aortic valve regurgitation is not visualized.  FINDINGS Left Ventricle: Left ventricular ejection fraction, by estimation, is 60 to 65%. The left ventricle has normal function. The left ventricle has no regional wall motion abnormalities. The left ventricular internal cavity size was normal in size. There is no left ventricular hypertrophy. Left ventricular diastolic parameters were normal.  Right Ventricle: The right ventricular size is normal. Right vetricular wall thickness was not assessed. Right ventricular systolic function is normal.  Left Atrium: Left atrial size was normal in size.  Right Atrium: Right atrial size was normal in size.  Pericardium: There is no evidence of pericardial effusion.  Mitral Valve: The mitral valve is normal in structure. Trivial mitral valve regurgitation.  Tricuspid Valve: The tricuspid valve is normal in structure. Tricuspid valve regurgitation is trivial.  Aortic Valve: The aortic valve is tricuspid. Aortic valve regurgitation is not visualized.  Pulmonic Valve: The pulmonic valve was normal in structure. Pulmonic valve regurgitation is mild.  Aorta: The aortic root and ascending aorta are structurally normal, with  no evidence of dilitation.  IAS/Shunts: No atrial level shunt detected by color flow Doppler.   LEFT VENTRICLE PLAX 2D LVIDd:         3.60 cm   Diastology LVIDs:         2.40 cm   LV e' medial:    6.96 cm/s LV PW:         1.00 cm   LV E/e' medial:  8.1 LV IVS:        1.00 cm   LV e' lateral:   9.79 cm/s LVOT diam:     2.20 cm   LV E/e' lateral: 5.8 LV SV:         44 LV SV Index:   22 LVOT Area:     3.80 cm   RIGHT VENTRICLE RV Basal diam:  3.30 cm RV S prime:     8.81 cm/s TAPSE (M-mode): 1.5  cm  LEFT ATRIUM             Index        RIGHT ATRIUM           Index LA diam:        3.10 cm 1.52 cm/m   RA Area:     12.60 cm LA Vol (A2C):   26.3 ml 12.90 ml/m  RA Volume:   30.40 ml  14.91 ml/m LA Vol (A4C):   13.0 ml 6.37 ml/m LA Biplane Vol: 19.6 ml 9.61 ml/m AORTIC VALVE LVOT Vmax:   75.80 cm/s LVOT Vmean:  47.600 cm/s LVOT VTI:    0.117 m  AORTA Ao Root diam: 3.60 cm Ao Asc diam:  3.40 cm  MITRAL VALVE MV Area (PHT): 4.06 cm    SHUNTS MV Decel Time: 187 msec    Systemic VTI:  0.12 m MV E velocity: 56.70 cm/s  Systemic Diam: 2.20 cm MV A velocity: 56.20 cm/s MV E/A ratio:  1.01  Vina Gull MD Electronically signed by Vina Gull MD Signature Date/Time: 03/25/2022/5:00:59 PM    Final   TEE  ECHO INTRAOPERATIVE TEE 10/17/2018  Narrative *INTRAOPERATIVE TRANSESOPHAGEAL REPORT *    Patient Name:   Danny Brewer Date of Exam: 10/17/2018 Medical Rec #:  [id number]     Height:       69.0 in Accession #:    [id number]    Weight:       198.4 lb Date of Birth:  1971-02-27     BSA:          2.06 m Patient Age:    47 years      BP:           147/96 mmHg Patient Gender: M             HR:           106 bpm. Exam Location:  Inpatient  Transesophogeal exam was perform intraoperatively during surgical procedure. Patient was closely monitored under general anesthesia during the entirety of examination.  Indications:     Chest Pain Performing Phys: [id number] DALLAS NOVAK HZMYJMIU Diagnosing Phys: Elsie Needle MD  Complications: No known complications during this procedure. POST-OP IMPRESSIONS Overall, there were no significant changes from pre-bypass.  PRE-OP FINDINGS Left Ventricle: The left ventricle has normal systolic function, with an ejection fraction of 55-60%. The cavity size was normal. There is no increase in left ventricular wall thickness.  Right Ventricle: The right ventricle has normal systolic function. The cavity was mildly enlarged. There is no  increase in  right ventricular wall thickness.  Left Atrium: Left atrial size was not assessed.  Right Atrium: Right atrial size was not assessed.  Interatrial Septum: No atrial level shunt detected by color flow Doppler.  Pericardium: There is no evidence of pericardial effusion.  Mitral Valve: The mitral valve is normal in structure. Mitral valve regurgitation is trivial by color flow Doppler.  Tricuspid Valve: The tricuspid valve was normal in structure. Tricuspid valve regurgitation was not visualized by color flow Doppler.  Aortic Valve: The aortic valve is normal in structure. Aortic valve regurgitation was not visualized by color flow Doppler.  Pulmonic Valve: The pulmonic valve was normal in structure. Pulmonic valve regurgitation is not visualized by color flow Doppler.    Elsie Needle MD Electronically signed by Elsie Needle MD Signature Date/Time: 10/18/2018/11:38:13 AM    Final    CT SCANS  CT CORONARY FRACTIONAL FLOW RESERVE DATA PREP 10/06/2018  Narrative CLINICAL DATA:  Chest pain  EXAM: CT FFR  MEDICATIONS: No additional medications.  TECHNIQUE: The coronary CTA was sent for FFR.  FINDINGS: FFR 0.61 mid D1  FFR 0.73 mid D2  FFR 0.74 mid LAD and <0.5 distal LAD  Unable to interpret RCA due to artifact.  No obstructive disease in LCx.  IMPRESSION: Hemodynamically significant stenosis in the proximal LAD and proximal D1 and D2. Unable to interpret RCA due to artifact. Suggest cardiac cath.  Dalton Mclean   Electronically Signed By: Ezra Shuck M.D. On: 10/07/2018 16:50   CT CORONARY MORPH W/CTA COR W/SCORE 10/06/2018  Addendum 10/06/2018  4:22 PM ADDENDUM REPORT: 10/06/2018 16:20  CLINICAL DATA:  Chest pain  EXAM: Cardiac CTA  MEDICATIONS: Sub lingual nitro. 4mg  x 2 and lopressor  mg  TECHNIQUE: The patient was scanned on a Siemens 192 slice scanner. Gantry rotation speed was 250 msecs. Collimation was 0.6 mm.  A 100 kV prospective scan was triggered in the ascending thoracic aorta at 35-75% of the R-R interval. Average HR during the scan was bpm. The 3D data set was interpreted on a dedicated work station using MPR, MIP and VRT modes. A total of 80cc of contrast was used.  FINDINGS: Non-cardiac: See separate report from [facility].  Pulmonary veins drain normally to the left atrium.  Calcium  Score: Coronary artery calcium  score 448 Agatston units.  Coronary Arteries: Right dominant with no anomalies  LM: No plaque or stenosis.  LAD system: Extensive mixed plaque with severe (70-90%) stenosis in the proximal LAD. D1 and D2 are small to moderate vessels and appear to have significant mixed plaque proximally (70-90% stenosis).  Circumflex system: Relatively small vessel, no plaque or stenosis.  RCA system: The mid RCA is obscured by motion artifact in all phases. However, there is mixed plaque in this area as well. Cannot rule out severe (70-90%) stenosis.  IMPRESSION: 1. Coronary artery calcium  score 448 Agatston units. This places the patient in the 98th percentile for age and gender, suggesting high risk of future cardiac events.  2. Suspected severe proximal LAD stenosis. There also appears to be significant disease in small-moderate D1 and D2.  3. Mid RCA is obscured by motion artifact but also has significant plaque. Possible severe (70-90%) stenosis.  Will send for FFR.  Dalton Mclean   Electronically Signed By: Ezra Shuck M.D. On: 10/06/2018 16:20  Narrative EXAM: OVER-READ INTERPRETATION  CT CHEST  The following report is an over-read performed by radiologist Dr. Marcey Moan of [facility] on 10/06/2018. This over-read does not include interpretation  of cardiac or coronary anatomy or pathology. The coronary CTA interpretation by the cardiologist is attached.  COMPARISON:  None.  FINDINGS: Vascular: No incidental noncardiac  vascular findings.  Mediastinum/Nodes: Visualized mediastinum and hilar regions show no lymphadenopathy or masses.  Lungs/Pleura: Visualized lungs show no evidence of pulmonary edema, consolidation, pneumothorax, nodule or pleural fluid.  Upper Abdomen: No acute abnormality.  Musculoskeletal: No chest wall mass or suspicious bone lesions identified.  IMPRESSION: No significant incidental findings.  Electronically Signed: By: Marcey Moan M.D. On: 10/06/2018 10:06     ______________________________________________________________________________________________      EKG:        Recent Labs: 06/13/2024: ALT 15; BUN 11; Creatinine, Ser 0.95; Hemoglobin 14.9; Platelets 224; Potassium 4.2; Sodium 140  Recent Lipid Panel    Component Value Date/Time   CHOL 213 (H) 12/08/2023 1218   TRIG 93 12/08/2023 1218   HDL 60 12/08/2023 1218   CHOLHDL 3.6 12/08/2023 1218   CHOLHDL 4.3 09/19/2018 0243   VLDL 13 09/19/2018 0243   LDLCALC 136 (H) 12/08/2023 1218     Risk Assessment/Calculations:                Physical Exam:    VS:  BP 110/80   Pulse (!) 108   Ht 5' 9 (1.753 m)   Wt 190 lb (86.2 kg)   SpO2 98%   BMI 28.06 kg/m     Wt Readings from Last 3 Encounters:  06/14/24 190 lb (86.2 kg)  12/08/23 178 lb 9.6 oz (81 kg)  11/12/23 181 lb 1.6 oz (82.1 kg)     GEN:  Well nourished, well developed in no acute distress HEENT: Normal NECK: No JVD; No carotid bruits LYMPHATICS: No lymphadenopathy CARDIAC: RRR, no murmurs, rubs, gallops RESPIRATORY:  Clear to auscultation without rales, wheezing or rhonchi  ABDOMEN: Soft, non-tender, non-distended MUSCULOSKELETAL:  No edema; No deformity  SKIN: Warm and dry NEUROLOGIC:  Alert and oriented x 3 PSYCHIATRIC:  Normal affect   Assessment & Plan Coronary artery disease involving native coronary artery of native heart without angina pectoris Continue aspirin  and Repatha  Essential hypertension Blood pressure  well-controlled on amlodipine  Hyperlipidemia LDL goal <55 Patient is statin intolerant.  He will continue on Repatha .  Most recent lipids reviewed with cholesterol 153, HDL 59, LDL 70.  He will work on lifestyle modification.  Reports he has a goal of 10 pound weight loss. Type 2 diabetes mellitus without complication, without long-term current use of insulin  [facility]) Patient is treated with Jardiance .  Hemoglobin A1c is 6.3.  Followed by his primary physician. Preoperative cardiovascular examination The patient will need carpal tunnel surgery.  This is a low risk surgical procedure from a cardiac perspective.  He is easily able to achieve greater than 4 metabolic equivalents with no associated cardiac symptoms.  He is at low risk of cardiac-related complications and can proceed with surgery without further testing.  LVEF is normal.  He has had no symptoms of arrhythmia, heart failure, or angina.      Medication Adjustments/Labs and Tests Ordered: Current medicines are reviewed at length with the patient today.  Concerns regarding medicines are outlined above.  No orders of the defined types were placed in this encounter.  Meds ordered this encounter  Medications   empagliflozin  (JARDIANCE ) 25 MG TABS tablet    Sig: Take 1 tablet (25 mg total) by mouth daily before breakfast.    Dispense:  90 tablet    Refill:  3    Replacing  januvia    Evolocumab  (REPATHA  SURECLICK) 140 MG/ML SOAJ    Sig: Inject 140 mg into the skin every 14 (fourteen) days.    Dispense:  6 mL    Refill:  3   amLODipine  (NORVASC ) 10 MG tablet    Sig: Take 1 tablet (10 mg total) by mouth daily.    Dispense:  90 tablet    Refill:  3    Patient Instructions  Medication Instructions:  Refill for Amlodipine , Jardiance  and Repatha  has been sent to your pharmacy.  *If you need a refill on your cardiac medications before your next appointment, please call your pharmacy*  Lab Work: None ordered today. If you have labs  (blood work) drawn today and your tests are completely normal, you will receive your results only by: MyChart Message (if you have MyChart) OR A paper copy in the mail If you have any lab test that is abnormal or we need to change your treatment, we will call you to review the results.  Testing/Procedures: None ordered today.  Follow-Up: At [facility], you and your health needs are our priority.  As part of our continuing mission to provide you with exceptional heart care, our providers are all part of one team.  This team includes your primary Cardiologist (physician) and Advanced Practice Providers or APPs (Physician Assistants and Nurse Practitioners) who all work together to provide you with the care you need, when you need it.  Your next appointment:   6 month(s)  Provider:   One of our Advanced Practice Providers (APPs): Morse Clause, PA-C  Lamarr Satterfield, NP Miriam Shams, NP  Olivia Pavy, PA-C Josefa Beauvais, NP  Leontine Salen, PA-C Orren Fabry, PA-C  [place], PA-C Ernest Dick, NP  Damien Braver, NP Danny Hails, PA-C  Waddell Donath, PA-C    Danny Dunn, PA-C  Scott Weaver, PA-C Lum Louis, NP Katlyn West, NP Callie Goodrich, PA-C  Xika Zhao, NP Sheng Haley, PA-C    Kathleen Johnson, PA-C   Then, Danny Fell, MD will plan to see you again in 1 year(s).     Signed, Danny Fell, MD  06/14/2024 5:49 PM    [facility]     [1]  Current Meds  Medication Sig   acetaminophen  (TYLENOL ) 500 MG tablet Take 1 tablet (500 mg total) by mouth every 6 (six) hours as needed. Please take every 6 hrs and stagger with Motrin  3 hrs apart from each other. Please do not exceed maximum daily dose to avoid liver damage   aspirin  81 MG tablet Take 1 tablet (81 mg total) by mouth daily.   ibuprofen  (ADVIL ) 600 MG tablet Take 1 tablet (600 mg total) by mouth every 6 (six) hours. Please take every 6 hrs, and stagger this medication 3 hrs apart from Tylenol     tamsulosin  (FLOMAX ) 0.4 MG CAPS capsule Take 0.4 mg by mouth daily as needed (for prostate).   [DISCONTINUED] amLODipine  (NORVASC ) 10 MG tablet 1 tablets Orally Once a day - Rx by Dr. Fell   [DISCONTINUED] empagliflozin  (JARDIANCE ) 25 MG TABS tablet Take 1 tablet (25 mg total) by mouth daily before breakfast.   [DISCONTINUED] Evolocumab  (REPATHA  SURECLICK) 140 MG/ML SOAJ Inject 140 mg into the skin every 14 (fourteen) days.   Current Facility-Administered Medications for the 06/14/24 encounter (Office Visit) with Brewer Ozell, MD  Medication   incobotulinumtoxinA  (XEOMIN ) 100 units injection 70 Units   "

## 2024-06-14 ENCOUNTER — Encounter: Payer: Self-pay | Admitting: Cardiovascular Disease

## 2024-06-14 ENCOUNTER — Ambulatory Visit: Attending: Cardiovascular Disease | Admitting: Cardiovascular Disease

## 2024-06-14 VITALS — BP 110/80 | HR 108 | Ht 69.0 in | Wt 190.0 lb

## 2024-06-14 DIAGNOSIS — E119 Type 2 diabetes mellitus without complications: Secondary | ICD-10-CM

## 2024-06-14 DIAGNOSIS — Z01810 Encounter for preprocedural cardiovascular examination: Secondary | ICD-10-CM

## 2024-06-14 DIAGNOSIS — E785 Hyperlipidemia, unspecified: Secondary | ICD-10-CM | POA: Diagnosis not present

## 2024-06-14 DIAGNOSIS — I10 Essential (primary) hypertension: Secondary | ICD-10-CM | POA: Diagnosis not present

## 2024-06-14 DIAGNOSIS — I2510 Atherosclerotic heart disease of native coronary artery without angina pectoris: Secondary | ICD-10-CM

## 2024-06-14 MED ORDER — REPATHA SURECLICK 140 MG/ML ~~LOC~~ SOAJ: 140.0000 mg | SUBCUTANEOUS | 3 refills | Status: AC

## 2024-06-14 MED ORDER — AMLODIPINE BESYLATE 10 MG PO TABS: 10.0000 mg | ORAL_TABLET | Freq: Every day | ORAL | 3 refills | Status: AC

## 2024-06-14 MED ORDER — EMPAGLIFLOZIN 25 MG PO TABS: 25.0000 mg | ORAL_TABLET | Freq: Every day | ORAL | 3 refills | Status: AC

## 2024-06-14 NOTE — Assessment & Plan Note (Addendum)
 The patient will need carpal tunnel surgery.  This is a low risk surgical procedure from a cardiac perspective.  He is easily able to achieve greater than 4 metabolic equivalents with no associated cardiac symptoms.  He is at low risk of cardiac-related complications and can proceed with surgery without further testing.  LVEF is normal.  He has had no symptoms of arrhythmia, heart failure, or angina.

## 2024-06-14 NOTE — Assessment & Plan Note (Addendum)
 Patient is treated with Jardiance .  Hemoglobin A1c is 6.3.  Followed by his primary physician.

## 2024-06-14 NOTE — Patient Instructions (Addendum)
 Medication Instructions:  Refill for Amlodipine , Jardiance  and Repatha  has been sent to your pharmacy.  *If you need a refill on your cardiac medications before your next appointment, please call your pharmacy*  Lab Work: None ordered today. If you have labs (blood work) drawn today and your tests are completely normal, you will receive your results only by: MyChart Message (if you have MyChart) OR A paper copy in the mail If you have any lab test that is abnormal or we need to change your treatment, we will call you to review the results.  Testing/Procedures: None ordered today.  Follow-Up: At [facility], you and your health needs are our priority.  As part of our continuing mission to provide you with exceptional heart care, our providers are all part of one team.  This team includes your primary Cardiologist (physician) and Advanced Practice Providers or APPs (Physician Assistants and Nurse Practitioners) who all work together to provide you with the care you need, when you need it.  Your next appointment:   6 month(s)  Provider:   One of our Advanced Practice Providers (APPs): Morse Clause, PA-C  Lamarr Satterfield, NP Miriam Shams, NP  Olivia Pavy, PA-C Josefa Beauvais, NP  Leontine Salen, PA-C Orren Fabry, PA-C  [place], PA-C Ernest Dick, NP  Damien Braver, NP Jon Hails, PA-C  Waddell Donath, PA-C    Dayna Dunn, PA-C  Scott Weaver, PA-C Lum Louis, NP Katlyn West, NP Callie Goodrich, PA-C  Xika Zhao, NP Sheng Haley, PA-C    Kathleen Johnson, PA-C   Then, Ozell Fell, MD will plan to see you again in 1 year(s).

## 2024-06-14 NOTE — Assessment & Plan Note (Addendum)
 Blood pressure well controlled on amlodipine

## 2024-06-14 NOTE — Assessment & Plan Note (Addendum)
 Continue aspirin and Repatha.

## 2024-06-22 ENCOUNTER — Ambulatory Visit (INDEPENDENT_AMBULATORY_CARE_PROVIDER_SITE_OTHER): Admitting: Audiology

## 2024-06-28 ENCOUNTER — Other Ambulatory Visit: Payer: Self-pay

## 2024-06-30 ENCOUNTER — Other Ambulatory Visit: Payer: Self-pay

## 2024-07-03 ENCOUNTER — Other Ambulatory Visit (HOSPITAL_COMMUNITY): Payer: Self-pay

## 2024-07-05 ENCOUNTER — Other Ambulatory Visit: Payer: Self-pay

## 2024-07-07 ENCOUNTER — Other Ambulatory Visit: Payer: Self-pay

## 2024-07-10 ENCOUNTER — Telehealth: Payer: Self-pay | Admitting: Neurology

## 2024-07-10 ENCOUNTER — Other Ambulatory Visit: Payer: Self-pay | Admitting: Neurology

## 2024-07-10 ENCOUNTER — Other Ambulatory Visit: Payer: Self-pay

## 2024-07-10 MED ORDER — SUMATRIPTAN SUCCINATE 100 MG PO TABS: 100.0000 mg | ORAL_TABLET | Freq: Once | ORAL | 2 refills | Status: AC | PRN

## 2024-07-10 NOTE — Telephone Encounter (Signed)
 Patient called the answering service complaining of worsening migraines for the past months. He has not had BOTOX  therapy for more than 3 months. Explained to patient that he is experiencing increase in migraines frequency. Will give him a Rx for Sumatriptan  and hopefully he can seen soon as his appointment for tomorrow was cancelled due to weather.   Dr. Shatira Dobosz

## 2024-07-11 ENCOUNTER — Ambulatory Visit: Admitting: Neurology

## 2024-07-11 ENCOUNTER — Other Ambulatory Visit (HOSPITAL_COMMUNITY): Payer: Self-pay

## 2024-07-11 NOTE — Telephone Encounter (Signed)
 Called and spoke to pt and was able to schedule him sooner

## 2024-07-12 ENCOUNTER — Other Ambulatory Visit: Payer: Self-pay

## 2024-07-14 ENCOUNTER — Ambulatory Visit: Admitting: Cardiovascular Disease

## 2024-07-17 ENCOUNTER — Ambulatory Visit: Admitting: Neurology
